# Patient Record
Sex: Female | Born: 1960 | Race: White | Hispanic: No | Smoking: Current every day smoker
Health system: Southern US, Community
[De-identification: ages and names within clinical notes are randomized; demographics above are authoritative.]

## PROBLEM LIST (undated history)

## (undated) DIAGNOSIS — E669 Obesity, unspecified: Secondary | ICD-10-CM

## (undated) DIAGNOSIS — E119 Type 2 diabetes mellitus without complications: Secondary | ICD-10-CM

## (undated) DIAGNOSIS — F32A Depression, unspecified: Secondary | ICD-10-CM

## (undated) DIAGNOSIS — I10 Essential (primary) hypertension: Secondary | ICD-10-CM

## (undated) DIAGNOSIS — Z720 Tobacco use: Secondary | ICD-10-CM

## (undated) DIAGNOSIS — E785 Hyperlipidemia, unspecified: Secondary | ICD-10-CM

## (undated) DIAGNOSIS — I2510 Atherosclerotic heart disease of native coronary artery without angina pectoris: Secondary | ICD-10-CM

## (undated) DIAGNOSIS — F329 Major depressive disorder, single episode, unspecified: Secondary | ICD-10-CM

## (undated) DIAGNOSIS — K219 Gastro-esophageal reflux disease without esophagitis: Secondary | ICD-10-CM

## (undated) DIAGNOSIS — G4730 Sleep apnea, unspecified: Secondary | ICD-10-CM

## (undated) DIAGNOSIS — M1990 Unspecified osteoarthritis, unspecified site: Secondary | ICD-10-CM

## (undated) HISTORY — PX: TUBAL LIGATION: SHX77

## (undated) HISTORY — DX: Depression, unspecified: F32.A

## (undated) HISTORY — DX: Major depressive disorder, single episode, unspecified: F32.9

## (undated) HISTORY — PX: CHOLECYSTECTOMY: SHX55

## (undated) HISTORY — PX: TONSILLECTOMY: SUR1361

---

## 1998-08-28 ENCOUNTER — Other Ambulatory Visit: Admission: RE | Admit: 1998-08-28 | Discharge: 1998-08-28 | Payer: Self-pay | Admitting: Internal Medicine

## 1998-09-22 ENCOUNTER — Ambulatory Visit (HOSPITAL_COMMUNITY): Admission: RE | Admit: 1998-09-22 | Discharge: 1998-09-22 | Payer: Self-pay | Admitting: Internal Medicine

## 1998-09-29 ENCOUNTER — Ambulatory Visit (HOSPITAL_COMMUNITY): Admission: RE | Admit: 1998-09-29 | Discharge: 1998-09-29 | Payer: Self-pay | Admitting: Family Medicine

## 1998-10-06 ENCOUNTER — Ambulatory Visit (HOSPITAL_COMMUNITY): Admission: RE | Admit: 1998-10-06 | Discharge: 1998-10-06 | Payer: Self-pay

## 1999-01-26 ENCOUNTER — Encounter: Payer: Self-pay | Admitting: Emergency Medicine

## 1999-01-26 ENCOUNTER — Emergency Department (HOSPITAL_COMMUNITY): Admission: EM | Admit: 1999-01-26 | Discharge: 1999-01-26 | Payer: Self-pay | Admitting: Emergency Medicine

## 1999-04-14 ENCOUNTER — Ambulatory Visit (HOSPITAL_COMMUNITY): Admission: RE | Admit: 1999-04-14 | Discharge: 1999-04-14 | Payer: Self-pay | Admitting: Family Medicine

## 2000-01-29 ENCOUNTER — Other Ambulatory Visit: Admission: RE | Admit: 2000-01-29 | Discharge: 2000-01-29 | Payer: Self-pay | Admitting: Internal Medicine

## 2000-06-30 ENCOUNTER — Encounter: Admission: RE | Admit: 2000-06-30 | Discharge: 2000-06-30 | Payer: Self-pay | Admitting: Obstetrics

## 2001-02-21 ENCOUNTER — Encounter: Admission: RE | Admit: 2001-02-21 | Discharge: 2001-02-21 | Payer: Self-pay | Admitting: Sports Medicine

## 2001-02-21 ENCOUNTER — Other Ambulatory Visit: Admission: RE | Admit: 2001-02-21 | Discharge: 2001-02-21 | Payer: Self-pay | Admitting: Obstetrics & Gynecology

## 2001-02-21 ENCOUNTER — Encounter (INDEPENDENT_AMBULATORY_CARE_PROVIDER_SITE_OTHER): Payer: Self-pay | Admitting: *Deleted

## 2001-02-22 ENCOUNTER — Encounter: Payer: Self-pay | Admitting: Sports Medicine

## 2001-02-22 ENCOUNTER — Encounter: Admission: RE | Admit: 2001-02-22 | Discharge: 2001-02-22 | Payer: Self-pay | Admitting: Sports Medicine

## 2001-02-24 ENCOUNTER — Encounter: Admission: RE | Admit: 2001-02-24 | Discharge: 2001-02-24 | Payer: Self-pay | Admitting: Sports Medicine

## 2001-02-24 ENCOUNTER — Encounter: Payer: Self-pay | Admitting: Sports Medicine

## 2001-03-07 ENCOUNTER — Encounter: Admission: RE | Admit: 2001-03-07 | Discharge: 2001-03-07 | Payer: Self-pay | Admitting: Sports Medicine

## 2001-04-06 ENCOUNTER — Encounter: Admission: RE | Admit: 2001-04-06 | Discharge: 2001-04-06 | Payer: Self-pay | Admitting: Family Medicine

## 2001-04-19 ENCOUNTER — Encounter: Admission: RE | Admit: 2001-04-19 | Discharge: 2001-04-19 | Payer: Self-pay | Admitting: Family Medicine

## 2001-05-10 ENCOUNTER — Encounter: Admission: RE | Admit: 2001-05-10 | Discharge: 2001-05-10 | Payer: Self-pay | Admitting: Family Medicine

## 2001-06-05 ENCOUNTER — Encounter: Admission: RE | Admit: 2001-06-05 | Discharge: 2001-06-05 | Payer: Self-pay | Admitting: Family Medicine

## 2001-06-29 ENCOUNTER — Ambulatory Visit (HOSPITAL_COMMUNITY): Admission: RE | Admit: 2001-06-29 | Discharge: 2001-06-29 | Payer: Self-pay | Admitting: Family Medicine

## 2001-06-29 ENCOUNTER — Encounter: Payer: Self-pay | Admitting: Gastroenterology

## 2001-10-25 ENCOUNTER — Encounter: Admission: RE | Admit: 2001-10-25 | Discharge: 2001-10-25 | Payer: Self-pay | Admitting: Family Medicine

## 2001-11-06 ENCOUNTER — Encounter: Admission: RE | Admit: 2001-11-06 | Discharge: 2001-11-06 | Payer: Self-pay | Admitting: Family Medicine

## 2001-11-29 ENCOUNTER — Encounter: Admission: RE | Admit: 2001-11-29 | Discharge: 2001-11-29 | Payer: Self-pay | Admitting: Family Medicine

## 2002-01-11 ENCOUNTER — Encounter: Admission: RE | Admit: 2002-01-11 | Discharge: 2002-01-11 | Payer: Self-pay | Admitting: Family Medicine

## 2002-03-06 ENCOUNTER — Encounter: Admission: RE | Admit: 2002-03-06 | Discharge: 2002-03-06 | Payer: Self-pay | Admitting: Internal Medicine

## 2002-03-06 ENCOUNTER — Encounter: Payer: Self-pay | Admitting: Internal Medicine

## 2002-03-22 ENCOUNTER — Encounter: Admission: RE | Admit: 2002-03-22 | Discharge: 2002-03-22 | Payer: Self-pay | Admitting: Family Medicine

## 2002-03-30 ENCOUNTER — Encounter: Admission: RE | Admit: 2002-03-30 | Discharge: 2002-03-30 | Payer: Self-pay | Admitting: Family Medicine

## 2002-06-25 ENCOUNTER — Encounter: Admission: RE | Admit: 2002-06-25 | Discharge: 2002-06-25 | Payer: Self-pay | Admitting: Family Medicine

## 2002-09-13 ENCOUNTER — Encounter: Payer: Self-pay | Admitting: Emergency Medicine

## 2002-09-13 ENCOUNTER — Emergency Department (HOSPITAL_COMMUNITY): Admission: EM | Admit: 2002-09-13 | Discharge: 2002-09-13 | Payer: Self-pay | Admitting: Emergency Medicine

## 2002-10-04 ENCOUNTER — Encounter: Admission: RE | Admit: 2002-10-04 | Discharge: 2002-10-04 | Payer: Self-pay | Admitting: Family Medicine

## 2002-12-18 ENCOUNTER — Encounter: Admission: RE | Admit: 2002-12-18 | Discharge: 2002-12-18 | Payer: Self-pay | Admitting: Sports Medicine

## 2002-12-18 ENCOUNTER — Other Ambulatory Visit: Admission: RE | Admit: 2002-12-18 | Discharge: 2002-12-18 | Payer: Self-pay | Admitting: Family Medicine

## 2003-03-08 ENCOUNTER — Encounter: Admission: RE | Admit: 2003-03-08 | Discharge: 2003-03-08 | Payer: Self-pay | Admitting: Family Medicine

## 2003-03-12 ENCOUNTER — Ambulatory Visit (HOSPITAL_COMMUNITY): Admission: RE | Admit: 2003-03-12 | Discharge: 2003-03-12 | Payer: Self-pay | Admitting: Family Medicine

## 2003-03-12 ENCOUNTER — Encounter: Payer: Self-pay | Admitting: Family Medicine

## 2003-07-09 ENCOUNTER — Encounter: Admission: RE | Admit: 2003-07-09 | Discharge: 2003-07-09 | Payer: Self-pay | Admitting: Family Medicine

## 2003-07-25 ENCOUNTER — Encounter: Admission: RE | Admit: 2003-07-25 | Discharge: 2003-07-25 | Payer: Self-pay | Admitting: Family Medicine

## 2003-08-28 ENCOUNTER — Encounter: Admission: RE | Admit: 2003-08-28 | Discharge: 2003-08-28 | Payer: Self-pay | Admitting: Family Medicine

## 2003-10-17 ENCOUNTER — Encounter: Admission: RE | Admit: 2003-10-17 | Discharge: 2003-10-17 | Payer: Self-pay | Admitting: Family Medicine

## 2003-11-08 ENCOUNTER — Encounter: Admission: RE | Admit: 2003-11-08 | Discharge: 2003-11-08 | Payer: Self-pay | Admitting: Family Medicine

## 2003-11-14 ENCOUNTER — Encounter: Admission: RE | Admit: 2003-11-14 | Discharge: 2003-11-14 | Payer: Self-pay | Admitting: Sports Medicine

## 2004-03-13 ENCOUNTER — Ambulatory Visit (HOSPITAL_COMMUNITY): Admission: RE | Admit: 2004-03-13 | Discharge: 2004-03-13 | Payer: Self-pay | Admitting: Sports Medicine

## 2004-03-24 ENCOUNTER — Ambulatory Visit: Payer: Self-pay | Admitting: Sports Medicine

## 2004-03-24 ENCOUNTER — Other Ambulatory Visit: Admission: RE | Admit: 2004-03-24 | Discharge: 2004-03-24 | Payer: Self-pay | Admitting: Family Medicine

## 2004-03-27 ENCOUNTER — Encounter (INDEPENDENT_AMBULATORY_CARE_PROVIDER_SITE_OTHER): Payer: Self-pay | Admitting: Family Medicine

## 2004-03-27 LAB — CONVERTED CEMR LAB: Pap Smear: NORMAL

## 2004-03-28 ENCOUNTER — Encounter (INDEPENDENT_AMBULATORY_CARE_PROVIDER_SITE_OTHER): Payer: Self-pay | Admitting: *Deleted

## 2004-03-28 LAB — CONVERTED CEMR LAB

## 2004-07-01 ENCOUNTER — Ambulatory Visit: Payer: Self-pay | Admitting: Family Medicine

## 2004-10-17 ENCOUNTER — Emergency Department (HOSPITAL_COMMUNITY): Admission: EM | Admit: 2004-10-17 | Discharge: 2004-10-17 | Payer: Self-pay | Admitting: Emergency Medicine

## 2004-11-12 ENCOUNTER — Ambulatory Visit: Payer: Self-pay | Admitting: Family Medicine

## 2004-12-04 ENCOUNTER — Emergency Department (HOSPITAL_COMMUNITY): Admission: EM | Admit: 2004-12-04 | Discharge: 2004-12-05 | Payer: Self-pay | Admitting: Emergency Medicine

## 2004-12-07 ENCOUNTER — Ambulatory Visit: Payer: Self-pay | Admitting: Family Medicine

## 2005-03-15 ENCOUNTER — Ambulatory Visit: Payer: Self-pay | Admitting: Sports Medicine

## 2005-03-15 ENCOUNTER — Encounter: Admission: RE | Admit: 2005-03-15 | Discharge: 2005-03-15 | Payer: Self-pay | Admitting: Sports Medicine

## 2005-03-15 ENCOUNTER — Ambulatory Visit (HOSPITAL_COMMUNITY): Admission: RE | Admit: 2005-03-15 | Discharge: 2005-03-15 | Payer: Self-pay | Admitting: Family Medicine

## 2005-03-25 ENCOUNTER — Emergency Department (HOSPITAL_COMMUNITY): Admission: EM | Admit: 2005-03-25 | Discharge: 2005-03-25 | Payer: Self-pay | Admitting: Emergency Medicine

## 2005-04-26 ENCOUNTER — Ambulatory Visit: Payer: Self-pay | Admitting: Family Medicine

## 2005-05-07 ENCOUNTER — Ambulatory Visit: Payer: Self-pay | Admitting: Family Medicine

## 2005-05-12 ENCOUNTER — Emergency Department (HOSPITAL_COMMUNITY): Admission: EM | Admit: 2005-05-12 | Discharge: 2005-05-12 | Payer: Self-pay | Admitting: Emergency Medicine

## 2005-06-09 ENCOUNTER — Emergency Department (HOSPITAL_COMMUNITY): Admission: EM | Admit: 2005-06-09 | Discharge: 2005-06-09 | Payer: Self-pay | Admitting: Family Medicine

## 2005-06-27 ENCOUNTER — Emergency Department (HOSPITAL_COMMUNITY): Admission: EM | Admit: 2005-06-27 | Discharge: 2005-06-27 | Payer: Self-pay | Admitting: Emergency Medicine

## 2005-06-30 ENCOUNTER — Encounter (INDEPENDENT_AMBULATORY_CARE_PROVIDER_SITE_OTHER): Payer: Self-pay | Admitting: *Deleted

## 2005-06-30 ENCOUNTER — Observation Stay (HOSPITAL_COMMUNITY): Admission: EM | Admit: 2005-06-30 | Discharge: 2005-07-01 | Payer: Self-pay | Admitting: Emergency Medicine

## 2005-07-11 ENCOUNTER — Emergency Department (HOSPITAL_COMMUNITY): Admission: EM | Admit: 2005-07-11 | Discharge: 2005-07-11 | Payer: Self-pay | Admitting: Family Medicine

## 2005-07-14 ENCOUNTER — Ambulatory Visit: Payer: Self-pay | Admitting: Sports Medicine

## 2005-09-09 ENCOUNTER — Emergency Department (HOSPITAL_COMMUNITY): Admission: EM | Admit: 2005-09-09 | Discharge: 2005-09-09 | Payer: Self-pay | Admitting: Family Medicine

## 2005-09-22 ENCOUNTER — Encounter: Payer: Self-pay | Admitting: Family Medicine

## 2006-02-23 ENCOUNTER — Ambulatory Visit: Payer: Self-pay | Admitting: Family Medicine

## 2006-03-16 ENCOUNTER — Ambulatory Visit (HOSPITAL_COMMUNITY): Admission: RE | Admit: 2006-03-16 | Discharge: 2006-03-16 | Payer: Self-pay | Admitting: Internal Medicine

## 2006-05-12 ENCOUNTER — Ambulatory Visit: Payer: Self-pay | Admitting: Family Medicine

## 2006-07-21 DIAGNOSIS — F339 Major depressive disorder, recurrent, unspecified: Secondary | ICD-10-CM | POA: Insufficient documentation

## 2006-07-21 DIAGNOSIS — E669 Obesity, unspecified: Secondary | ICD-10-CM | POA: Insufficient documentation

## 2006-07-21 DIAGNOSIS — F17200 Nicotine dependence, unspecified, uncomplicated: Secondary | ICD-10-CM | POA: Insufficient documentation

## 2006-07-21 DIAGNOSIS — K219 Gastro-esophageal reflux disease without esophagitis: Secondary | ICD-10-CM | POA: Insufficient documentation

## 2006-07-21 DIAGNOSIS — N393 Stress incontinence (female) (male): Secondary | ICD-10-CM | POA: Insufficient documentation

## 2006-07-22 ENCOUNTER — Encounter (INDEPENDENT_AMBULATORY_CARE_PROVIDER_SITE_OTHER): Payer: Self-pay | Admitting: *Deleted

## 2006-10-24 ENCOUNTER — Ambulatory Visit: Payer: Self-pay | Admitting: Family Medicine

## 2006-10-24 ENCOUNTER — Telehealth: Payer: Self-pay | Admitting: *Deleted

## 2006-10-29 ENCOUNTER — Emergency Department (HOSPITAL_COMMUNITY): Admission: EM | Admit: 2006-10-29 | Discharge: 2006-10-29 | Payer: Self-pay | Admitting: Family Medicine

## 2007-01-29 ENCOUNTER — Emergency Department (HOSPITAL_COMMUNITY): Admission: EM | Admit: 2007-01-29 | Discharge: 2007-01-29 | Payer: Self-pay | Admitting: Family Medicine

## 2007-02-06 ENCOUNTER — Ambulatory Visit: Payer: Self-pay | Admitting: Internal Medicine

## 2007-02-10 ENCOUNTER — Encounter (INDEPENDENT_AMBULATORY_CARE_PROVIDER_SITE_OTHER): Payer: Self-pay | Admitting: *Deleted

## 2007-02-15 ENCOUNTER — Emergency Department (HOSPITAL_COMMUNITY): Admission: EM | Admit: 2007-02-15 | Discharge: 2007-02-16 | Payer: Self-pay | Admitting: Emergency Medicine

## 2007-03-22 ENCOUNTER — Ambulatory Visit (HOSPITAL_COMMUNITY): Admission: RE | Admit: 2007-03-22 | Discharge: 2007-03-22 | Payer: Self-pay | Admitting: Family Medicine

## 2007-04-06 ENCOUNTER — Encounter (INDEPENDENT_AMBULATORY_CARE_PROVIDER_SITE_OTHER): Payer: Self-pay | Admitting: Family Medicine

## 2007-04-12 ENCOUNTER — Ambulatory Visit: Payer: Self-pay | Admitting: Family Medicine

## 2007-04-12 ENCOUNTER — Encounter (INDEPENDENT_AMBULATORY_CARE_PROVIDER_SITE_OTHER): Payer: Self-pay | Admitting: Family Medicine

## 2007-04-12 DIAGNOSIS — R5383 Other fatigue: Secondary | ICD-10-CM

## 2007-04-12 DIAGNOSIS — R5381 Other malaise: Secondary | ICD-10-CM | POA: Insufficient documentation

## 2007-04-12 DIAGNOSIS — I10 Essential (primary) hypertension: Secondary | ICD-10-CM | POA: Insufficient documentation

## 2007-04-12 LAB — CONVERTED CEMR LAB
Free T4: 0.99 ng/dL (ref 0.89–1.80)
HCT: 44.7 % (ref 36.0–46.0)
Hemoglobin: 14.7 g/dL (ref 12.0–15.0)
MCHC: 32.9 g/dL (ref 30.0–36.0)
MCV: 95.9 fL (ref 78.0–100.0)
Platelets: 262 10*3/uL (ref 150–400)
RBC: 4.66 M/uL (ref 3.87–5.11)
RDW: 14.2 % (ref 11.5–15.5)
T3, Free: 3 pg/mL (ref 2.3–4.2)
TSH: 2.226 microintl units/mL (ref 0.350–5.50)
WBC: 8.6 10*3/uL (ref 4.0–10.5)

## 2007-04-13 ENCOUNTER — Encounter (INDEPENDENT_AMBULATORY_CARE_PROVIDER_SITE_OTHER): Payer: Self-pay | Admitting: Family Medicine

## 2007-04-27 ENCOUNTER — Encounter (INDEPENDENT_AMBULATORY_CARE_PROVIDER_SITE_OTHER): Payer: Self-pay | Admitting: Family Medicine

## 2007-04-27 ENCOUNTER — Other Ambulatory Visit: Admission: RE | Admit: 2007-04-27 | Discharge: 2007-04-27 | Payer: Self-pay | Admitting: Family Medicine

## 2007-04-27 ENCOUNTER — Ambulatory Visit: Payer: Self-pay | Admitting: Family Medicine

## 2007-04-27 LAB — CONVERTED CEMR LAB
Chlamydia, DNA Probe: NEGATIVE
GC Probe Amp, Genital: NEGATIVE
Whiff Test: POSITIVE

## 2007-05-01 ENCOUNTER — Encounter (INDEPENDENT_AMBULATORY_CARE_PROVIDER_SITE_OTHER): Payer: Self-pay | Admitting: Family Medicine

## 2007-05-01 ENCOUNTER — Emergency Department (HOSPITAL_COMMUNITY): Admission: EM | Admit: 2007-05-01 | Discharge: 2007-05-01 | Payer: Self-pay | Admitting: Emergency Medicine

## 2007-05-01 ENCOUNTER — Ambulatory Visit: Payer: Self-pay | Admitting: Family Medicine

## 2007-05-01 LAB — CONVERTED CEMR LAB
ALT: 11 units/L (ref 0–35)
AST: 13 units/L (ref 0–37)
Albumin: 3.8 g/dL (ref 3.5–5.2)
Alkaline Phosphatase: 79 units/L (ref 39–117)
BUN: 18 mg/dL (ref 6–23)
CO2: 28 meq/L (ref 19–32)
Calcium: 9 mg/dL (ref 8.4–10.5)
Chloride: 104 meq/L (ref 96–112)
Cholesterol: 195 mg/dL (ref 0–200)
Creatinine, Ser: 0.63 mg/dL (ref 0.40–1.20)
Glucose, Bld: 120 mg/dL — ABNORMAL HIGH (ref 70–99)
HDL: 42 mg/dL (ref >39)
LDL Cholesterol: 107 mg/dL — ABNORMAL HIGH (ref 0–99)
Potassium: 3.9 meq/L (ref 3.5–5.3)
Sodium: 143 meq/L (ref 135–145)
Total Bilirubin: 0.3 mg/dL (ref 0.3–1.2)
Total CHOL/HDL Ratio: 4.6
Total Protein: 6.4 g/dL (ref 6.0–8.3)
Triglycerides: 229 mg/dL — ABNORMAL HIGH (ref <150)
VLDL: 46 mg/dL — ABNORMAL HIGH (ref 0–40)

## 2007-05-03 ENCOUNTER — Encounter (INDEPENDENT_AMBULATORY_CARE_PROVIDER_SITE_OTHER): Payer: Self-pay | Admitting: Family Medicine

## 2007-05-03 LAB — CONVERTED CEMR LAB
Cholesterol, target level: 200 mg/dL
HDL goal, serum: 40 mg/dL
LDL Goal: 130 mg/dL
Pap Smear: NORMAL

## 2007-06-26 ENCOUNTER — Ambulatory Visit: Payer: Self-pay | Admitting: Family Medicine

## 2007-06-26 LAB — CONVERTED CEMR LAB
Bilirubin Urine: NEGATIVE
Glucose, Urine, Semiquant: NEGATIVE
Ketones, urine, test strip: NEGATIVE
Nitrite: NEGATIVE
Specific Gravity, Urine: 1.025
Urobilinogen, UA: 0.2
WBC, UA: >20 cells/hpf
Whiff Test: NEGATIVE
pH: 6.5

## 2007-06-30 ENCOUNTER — Telehealth: Payer: Self-pay | Admitting: *Deleted

## 2007-07-04 ENCOUNTER — Telehealth (INDEPENDENT_AMBULATORY_CARE_PROVIDER_SITE_OTHER): Payer: Self-pay | Admitting: Family Medicine

## 2007-07-22 ENCOUNTER — Emergency Department (HOSPITAL_COMMUNITY): Admission: EM | Admit: 2007-07-22 | Discharge: 2007-07-23 | Payer: Self-pay | Admitting: Emergency Medicine

## 2007-08-24 ENCOUNTER — Emergency Department (HOSPITAL_COMMUNITY): Admission: EM | Admit: 2007-08-24 | Discharge: 2007-08-25 | Payer: Self-pay | Admitting: Emergency Medicine

## 2007-08-29 ENCOUNTER — Ambulatory Visit: Payer: Self-pay | Admitting: Family Medicine

## 2007-08-30 ENCOUNTER — Encounter (INDEPENDENT_AMBULATORY_CARE_PROVIDER_SITE_OTHER): Payer: Self-pay | Admitting: *Deleted

## 2007-11-12 ENCOUNTER — Emergency Department (HOSPITAL_COMMUNITY): Admission: EM | Admit: 2007-11-12 | Discharge: 2007-11-12 | Payer: Self-pay | Admitting: Emergency Medicine

## 2008-01-29 ENCOUNTER — Emergency Department (HOSPITAL_COMMUNITY): Admission: EM | Admit: 2008-01-29 | Discharge: 2008-01-29 | Payer: Self-pay | Admitting: Emergency Medicine

## 2008-03-22 ENCOUNTER — Emergency Department (HOSPITAL_COMMUNITY): Admission: EM | Admit: 2008-03-22 | Discharge: 2008-03-22 | Payer: Self-pay | Admitting: Emergency Medicine

## 2008-03-25 ENCOUNTER — Ambulatory Visit (HOSPITAL_COMMUNITY): Admission: RE | Admit: 2008-03-25 | Discharge: 2008-03-25 | Payer: Self-pay | Admitting: Family Medicine

## 2008-04-02 ENCOUNTER — Ambulatory Visit: Payer: Self-pay | Admitting: Family Medicine

## 2008-04-02 ENCOUNTER — Encounter: Admission: RE | Admit: 2008-04-02 | Discharge: 2008-04-02 | Payer: Self-pay | Admitting: Family Medicine

## 2008-04-02 ENCOUNTER — Telehealth (INDEPENDENT_AMBULATORY_CARE_PROVIDER_SITE_OTHER): Payer: Self-pay | Admitting: *Deleted

## 2008-04-02 DIAGNOSIS — R05 Cough: Secondary | ICD-10-CM

## 2008-04-02 DIAGNOSIS — R059 Cough, unspecified: Secondary | ICD-10-CM | POA: Insufficient documentation

## 2008-04-05 ENCOUNTER — Telehealth: Payer: Self-pay | Admitting: *Deleted

## 2008-04-08 ENCOUNTER — Encounter: Payer: Self-pay | Admitting: Family Medicine

## 2008-04-08 ENCOUNTER — Other Ambulatory Visit: Admission: RE | Admit: 2008-04-08 | Discharge: 2008-04-08 | Payer: Self-pay | Admitting: Family Medicine

## 2008-04-08 ENCOUNTER — Ambulatory Visit: Payer: Self-pay | Admitting: Family Medicine

## 2008-04-08 DIAGNOSIS — R3915 Urgency of urination: Secondary | ICD-10-CM | POA: Insufficient documentation

## 2008-04-08 DIAGNOSIS — L608 Other nail disorders: Secondary | ICD-10-CM | POA: Insufficient documentation

## 2008-04-08 DIAGNOSIS — N951 Menopausal and female climacteric states: Secondary | ICD-10-CM | POA: Insufficient documentation

## 2008-04-08 LAB — CONVERTED CEMR LAB
ALT: 12 units/L (ref 0–35)
AST: 12 units/L (ref 0–37)
Albumin: 3.7 g/dL (ref 3.5–5.2)
Alkaline Phosphatase: 90 units/L (ref 39–117)
BUN: 21 mg/dL (ref 6–23)
Bilirubin Urine: NEGATIVE
CO2: 24 meq/L (ref 19–32)
Calcium: 8.8 mg/dL (ref 8.4–10.5)
Chloride: 105 meq/L (ref 96–112)
Cholesterol: 222 mg/dL — ABNORMAL HIGH (ref 0–200)
Creatinine, Ser: 0.61 mg/dL (ref 0.40–1.20)
FSH: 7.3 milliintl units/mL
Glucose, Bld: 130 mg/dL — ABNORMAL HIGH (ref 70–99)
Glucose, Urine, Semiquant: NEGATIVE
HDL: 41 mg/dL (ref >39)
Ketones, urine, test strip: NEGATIVE
LDL Cholesterol: 144 mg/dL — ABNORMAL HIGH (ref 0–99)
Nitrite: NEGATIVE
Potassium: 4.3 meq/L (ref 3.5–5.3)
Protein, U semiquant: NEGATIVE
Sodium: 139 meq/L (ref 135–145)
Specific Gravity, Urine: 1.02
TSH: 1.658 microintl units/mL (ref 0.350–4.50)
Total Bilirubin: 0.4 mg/dL (ref 0.3–1.2)
Total CHOL/HDL Ratio: 5.4
Total Protein: 6.5 g/dL (ref 6.0–8.3)
Triglycerides: 187 mg/dL — ABNORMAL HIGH (ref <150)
Urobilinogen, UA: 0.2
VLDL: 37 mg/dL (ref 0–40)
WBC Urine, dipstick: NEGATIVE
pH: 6.5

## 2008-04-10 ENCOUNTER — Encounter: Payer: Self-pay | Admitting: Family Medicine

## 2008-04-11 ENCOUNTER — Telehealth: Payer: Self-pay | Admitting: Family Medicine

## 2008-04-12 ENCOUNTER — Encounter: Payer: Self-pay | Admitting: Family Medicine

## 2008-07-22 ENCOUNTER — Emergency Department (HOSPITAL_COMMUNITY): Admission: EM | Admit: 2008-07-22 | Discharge: 2008-07-22 | Payer: Self-pay | Admitting: Emergency Medicine

## 2008-10-29 ENCOUNTER — Emergency Department (HOSPITAL_COMMUNITY): Admission: EM | Admit: 2008-10-29 | Discharge: 2008-10-29 | Payer: Self-pay | Admitting: Emergency Medicine

## 2008-11-02 ENCOUNTER — Emergency Department (HOSPITAL_COMMUNITY): Admission: EM | Admit: 2008-11-02 | Discharge: 2008-11-02 | Payer: Self-pay | Admitting: Emergency Medicine

## 2009-03-26 ENCOUNTER — Ambulatory Visit (HOSPITAL_COMMUNITY): Admission: RE | Admit: 2009-03-26 | Discharge: 2009-03-26 | Payer: Self-pay | Admitting: Family Medicine

## 2009-04-03 ENCOUNTER — Emergency Department (HOSPITAL_COMMUNITY): Admission: EM | Admit: 2009-04-03 | Discharge: 2009-04-03 | Payer: Self-pay | Admitting: Emergency Medicine

## 2009-09-30 ENCOUNTER — Emergency Department (HOSPITAL_COMMUNITY): Admission: EM | Admit: 2009-09-30 | Discharge: 2009-09-30 | Payer: Self-pay | Admitting: Emergency Medicine

## 2009-10-23 ENCOUNTER — Emergency Department (HOSPITAL_COMMUNITY): Admission: EM | Admit: 2009-10-23 | Discharge: 2009-10-23 | Payer: Self-pay | Admitting: Emergency Medicine

## 2010-03-30 ENCOUNTER — Ambulatory Visit (HOSPITAL_COMMUNITY): Admission: RE | Admit: 2010-03-30 | Discharge: 2010-03-30 | Payer: Self-pay | Admitting: Family Medicine

## 2010-06-14 ENCOUNTER — Encounter: Payer: Self-pay | Admitting: Family Medicine

## 2010-06-25 NOTE — Letter (Signed)
Summary: Fasting Blood Glucose 130  [facility]  [address]   [place], [place] [address]   Phone: [phone]  Fax: [phone]    04/12/2008  Sarah Charles [address] [place], [place]  [address]  [name],  Your blood sugar level was 130.  A normal fasting level is 70-100.  This does not indicate that you have diabetes.  To formally diagnose diabetes, we need 2 values of blood sugar >126 measured on separate occasions.  While I do not think it is time to start any medicines, I do think it would be appropriate for you to see our nutritionist, to discuss diet modifications to keep your blood sugar under control and prevent you from developing diabetes.  I will forward your information to our nutritionist and ask her to call you to schedule an appointment.  Sincerely,   Romero Belling MD [facility]  Appended Document: Fasting Blood Glucose 130 letter sent

## 2010-06-25 NOTE — Letter (Signed)
Summary: Pap -- wnl  [facility]  [address]   [place], [place] [address]   Phone: [phone]  Fax: [phone]    04/10/2008  [address] [place], [place]  [address]  [name],   The following are the results of your recent test(s):  Test     Result     Pap Smear    Normal____X___  Not Normal_____       Comments:  Repeat in 1 year.  Cholesterol LDL(Bad cholesterol):   144       Your goal is less than:    100     HDL (Good cholesterol):    41    Your goal is more than: 40   Sincerely,  Romero Belling MD [facility]           Appended Document: Pap -- wnl mailed

## 2010-06-25 NOTE — Progress Notes (Signed)
Summary: Rx  Phone Note Call from Patient Call back at Home Phone [phone]   Summary of Call: Pt states she is unable to afford OTC prilosec and wants to speak with someone about getting something called in that is cheaper. Initial call taken by: Haydee Salter,  July 04, 2007 8:59 AM  Follow-up for Phone Call        pt line busy. sent request to md Follow-up by: Golden Circle RN,  July 04, 2007 9:17 AM  Additional Follow-up for Phone Call Additional follow up Details #1::        She can use Zantac OTC. Additional Follow-up by: Towana Badger MD,  July 04, 2007 1:41 PM    Additional Follow-up for Phone Call Additional follow up Details #2::    left message Follow-up by: Golden Circle RN,  July 04, 2007 1:52 PM  Additional Follow-up for Phone Call Additional follow up Details #3:: Details for Additional Follow-up Action Taken: Spoke with pt- advised that she can try Zantac OTC per Dr. Mannie Stabile.  Pt agreeable. Additional Follow-up by: AMY MARTIN RN,  July 05, 2007 9:54 AM

## 2010-08-10 LAB — COMPREHENSIVE METABOLIC PANEL
ALT: 18 U/L (ref 0–35)
AST: 19 U/L (ref 0–37)
Albumin: 3.8 g/dL (ref 3.5–5.2)
Alkaline Phosphatase: 107 U/L (ref 39–117)
BUN: 19 mg/dL (ref 6–23)
CO2: 30 mEq/L (ref 19–32)
Calcium: 9.5 mg/dL (ref 8.4–10.5)
Chloride: 104 mEq/L (ref 96–112)
Creatinine, Ser: 0.66 mg/dL (ref 0.4–1.2)
GFR calc Af Amer: >60 mL/min (ref >60)
GFR calc non Af Amer: >60 mL/min (ref >60)
Glucose, Bld: 143 mg/dL — ABNORMAL HIGH (ref 70–99)
Potassium: 4.1 mEq/L (ref 3.5–5.1)
Sodium: 140 mEq/L (ref 135–145)
Total Bilirubin: 0.6 mg/dL (ref 0.3–1.2)
Total Protein: 7.5 g/dL (ref 6.0–8.3)

## 2010-08-10 LAB — LIPASE, BLOOD: Lipase: 23 U/L (ref 11–59)

## 2010-08-10 LAB — DIFFERENTIAL
Basophils Absolute: 0.1 10*3/uL (ref 0.0–0.1)
Basophils Relative: 1 % (ref 0–1)
Eosinophils Absolute: 0.2 10*3/uL (ref 0.0–0.7)
Eosinophils Relative: 2 % (ref 0–5)
Lymphocytes Relative: 18 % (ref 12–46)
Lymphs Abs: 1.8 10*3/uL (ref 0.7–4.0)
Monocytes Absolute: 0.6 10*3/uL (ref 0.1–1.0)
Monocytes Relative: 6 % (ref 3–12)
Neutro Abs: 7.2 10*3/uL (ref 1.7–7.7)
Neutrophils Relative %: 73 % (ref 43–77)

## 2010-08-10 LAB — CBC
HCT: 41.3 % (ref 36.0–46.0)
Hemoglobin: 14.4 g/dL (ref 12.0–15.0)
MCHC: 34.8 g/dL (ref 30.0–36.0)
MCV: 91.5 fL (ref 78.0–100.0)
Platelets: 230 10*3/uL (ref 150–400)
RBC: 4.52 MIL/uL (ref 3.87–5.11)
RDW: 13.4 % (ref 11.5–15.5)
WBC: 9.9 10*3/uL (ref 4.0–10.5)

## 2010-08-10 LAB — URINALYSIS, ROUTINE W REFLEX MICROSCOPIC
Bilirubin Urine: NEGATIVE
Glucose, UA: NEGATIVE mg/dL
Hgb urine dipstick: NEGATIVE
Ketones, ur: NEGATIVE mg/dL
Nitrite: NEGATIVE
Protein, ur: NEGATIVE mg/dL
Specific Gravity, Urine: 1.032 — ABNORMAL HIGH (ref 1.005–1.030)
Urobilinogen, UA: 0.2 mg/dL (ref 0.0–1.0)
pH: 5 (ref 5.0–8.0)

## 2010-08-10 LAB — SEDIMENTATION RATE: Sed Rate: 18 mm/hr (ref 0–22)

## 2010-08-10 LAB — PREGNANCY, URINE: Preg Test, Ur: NEGATIVE

## 2010-08-19 ENCOUNTER — Other Ambulatory Visit: Payer: Self-pay | Admitting: Family Medicine

## 2010-08-19 ENCOUNTER — Other Ambulatory Visit (HOSPITAL_COMMUNITY)
Admission: RE | Admit: 2010-08-19 | Discharge: 2010-08-19 | Disposition: A | Discharge status: Home / Self Care | Payer: Medicaid Other | Source: Ambulatory Visit | Attending: Family Medicine | Admitting: Family Medicine

## 2010-08-19 DIAGNOSIS — Z01419 Encounter for gynecological examination (general) (routine) without abnormal findings: Secondary | ICD-10-CM | POA: Insufficient documentation

## 2010-10-08 ENCOUNTER — Emergency Department (HOSPITAL_COMMUNITY): Payer: No Typology Code available for payment source

## 2010-10-08 ENCOUNTER — Emergency Department (HOSPITAL_COMMUNITY)
Admission: EM | Admit: 2010-10-08 | Discharge: 2010-10-08 | Disposition: A | Discharge status: Home / Self Care | Payer: No Typology Code available for payment source | Attending: Emergency Medicine | Admitting: Emergency Medicine

## 2010-10-08 DIAGNOSIS — M545 Low back pain, unspecified: Secondary | ICD-10-CM | POA: Insufficient documentation

## 2010-10-08 DIAGNOSIS — T1490XA Injury, unspecified, initial encounter: Secondary | ICD-10-CM | POA: Insufficient documentation

## 2010-10-08 DIAGNOSIS — I10 Essential (primary) hypertension: Secondary | ICD-10-CM | POA: Insufficient documentation

## 2010-10-08 DIAGNOSIS — E119 Type 2 diabetes mellitus without complications: Secondary | ICD-10-CM | POA: Insufficient documentation

## 2010-10-08 DIAGNOSIS — M25519 Pain in unspecified shoulder: Secondary | ICD-10-CM | POA: Insufficient documentation

## 2010-10-08 DIAGNOSIS — W010XXA Fall on same level from slipping, tripping and stumbling without subsequent striking against object, initial encounter: Secondary | ICD-10-CM | POA: Insufficient documentation

## 2010-10-08 DIAGNOSIS — Y9229 Other specified public building as the place of occurrence of the external cause: Secondary | ICD-10-CM | POA: Insufficient documentation

## 2010-10-09 NOTE — Op Note (Signed)
NAME:  Sarah Charles, STREIGHT NO.:  [id number]   MEDICAL RECORD NO.:  [id number]          PATIENT TYPE:  INP   LOCATION:  5151                         FACILITY:  [facility]   PHYSICIAN:  Cherylynn Ridges, M.D.    DATE OF BIRTH:  1960-10-11   DATE OF PROCEDURE:  06/30/2005  DATE OF DISCHARGE:                                 OPERATIVE REPORT   PREOP DIAGNOSIS:  Cholelithiasis and acute cholecystitis.   POSTOP DIAGNOSIS:  Cholelithiasis and chronic cholecystitis   PROCEDURE:  Laparoscopic cholecystectomy.   SURGEON:  Cherylynn Ridges, M.D.   ASSISTANT:  Jerold Coombe, P.A.   ANESTHESIA:  General endotracheal.   ESTIMATED BLOOD LOSS:  Less than 20 mL.   COMPLICATIONS:  None.   CONDITION:  Stable.   INDICATIONS FOR OPERATION:  The patient is an otherwise healthy 50 year old  female with severe abdominal pain, sent over from general surgery office  with what was thought to be out to be acute cholecystitis.   FINDINGS:  The patient had a normal, mildly thickened gallbladder wall  without evidence of acute cholecystitis, and normal cholangiogram.   OPERATION:  The patient was taken to the operating room, placed on table in  the supine position. After an adequate endotracheal anesthetic was  administered, she was prepped and draped in usual sterile manner exposing  the midline and the right upper quadrant of the abdomen.   A supraumbilical curvilinear incision was made using an #11 blade; and was  taken down to the midline fascia. This fascia was grasped using Kocher  clamps and then subsequently an incision made in the fascia using a #15  blade. We were able to bluntly dissect into the peritoneal cavity using a  Kelly clamp; and then used a pursestring suture of #0 Vicryl to secure in a  Hassan cannula which was subsequently passed. Carbon dioxide gas up to a  maximal pressure of 15 mmHg was then insufflated through the [facility] cannula  into the peritoneal cavity. We  then passed via the right costal margin 5-mm  cannulas, and a subxiphoid 11-12-mm cannula under direct vision. Once this  was done, the patient was placed in reverse Trendelenburg and the left-side  was tilted down, and the operation begun.   The gallbladder was grasped using a ratcheted grasper through the lateral-  most 5-mm cannula and retracted towards the anterior abdominal wall and the  right upper quadrant. A second grasper was passed down into the infundibulum  of the gallbladder, tenting that out as we dissected out of the peritoneum  overlying the triangle of Fallot and hepatic duodenal triangle.  We  dissected out the cystic duct and the cystic artery. We doubly clipped the  cystic artery proximally and singly distally. We also placed a distal clip  along the gallbladder side of the cystic duct. We then made a  cholecystodochotomy using the laparoscopic scissors and performed a  cholangiogram using a Cook catheter which had been passed through the  anterior abdominal wall. This cholangiogram was secured in place with the  Endo clip; and cholangiogram was  performed which showed good flow into the  duodenum. No intraductal filling defects, good proximal filling; and no  evidence of obstruction.   Subsequent to doing the cholangiogram, we removed the clip, secured the  catheter in place, and removed the cholangiocatheter. We distally clipped  the cystic duct x2, and then transected it.  We then transected the artery,  and then dissected out the gallbladder, from its bed, with minimal  difficulty. An EndoCatch bag was used to retrieve the gallbladder from the  supraumbilical site.  Once the gallbladder was removed we irrigated with  about 1.2 liters of warm saline solution. Once there was found to be good  hemostasis in the bed, using electrocautery. We aspirated all gas and fluid  from above the liver; and removed all cannulas.   The supraumbilical site was closed using a  pursestring suture which was in  place to secure the Hasson cannula. Once all this was done, the skin at all  sites were closed using running subcuticular suture of 4-0 Vicryl. Then 1/4%  Marcaine with epinephrine was injected at all sites. All needle counts,  sponge counts, and instrument counts were correct at the conclusion of case.  Sterile dressings were applied.      Cherylynn Ridges, M.D.  Electronically Signed     JOW/MEDQ  D:  06/30/2005  T:  06/30/2005  Job:  [id number]

## 2010-10-09 NOTE — H&P (Signed)
NAME:  Sarah Charles, Sarah Charles NO.:  [id number]   MEDICAL RECORD NO.:  [id number]          PATIENT TYPE:  INP   LOCATION:  5151                         FACILITY:  [facility]   PHYSICIAN:  Cherylynn Ridges, M.D.    DATE OF BIRTH:  1961-02-11   DATE OF ADMISSION:  06/30/2005  DATE OF DISCHARGE:                                HISTORY & PHYSICAL   CHIEF COMPLAINT:  Abdominal pain in a patient with known cholelithiasis.   HISTORY OF PRESENT ILLNESS:  [name] is a 50 year old female patient,  history of chronic indigestion and intolerance to spicy foods for six years.  She has also noticed postprandial discomfort and nausea and indigestion  after greasy or fried foods.  Over the past one week she has noticed  increased abdominal pain not necessarily associated with eating.  She was  evaluated on February 4 at [facility] ER.  An ultrasound did demonstrate  positive cholelithiasis without ductal dilatation.  Her laboratory work was  within normal limits.  No elevation in LFTs or total bilirubin.  She was  sent home with oral pain medications and was set up to see Dr. Earlene Charles in the  office today for additional evaluation.  [name]   REVIEW OF SYSTEMS:  No fevers, chills, myalgias.  No cough.  No shortness of  breath.  No chest pain.  Abdominal pain as described.  No dark or bloody  stools.  No hematuria.  No dysuria.  No swelling in the legs.   FAMILY HISTORY:  Noncontributory to this admission.   PAST MEDICAL HISTORY:  None.   PAST SURGICAL HISTORY:  Prior EGD.   SOCIAL HISTORY:  She smokes one pack of cigarettes per day.  She does not  drink alcohol.  She recently split with a boyfriend of nine years.  She  works in Personnel officer.   ALLERGIES:  NKDA.   CURRENT MEDICATIONS:  Unknown pain medication.  Patient did not bring with  her and is not  listed in the documentation from [facility] ER.   PHYSICAL EXAMINATION:  GENERAL:  This is a pleasant female patient currently  complaining of significant epigastric and right upper quadrant abdominal  pain.  VITAL SIGNS:  Temperature 97, blood pressure 136/87, pulse 67 and regular,  respirations 20.  HEENT:  Head is normocephalic.  Sclerae not injected.  NECK:  Supple.  No adenopathy.  CHEST:  Bilateral lung sounds clear to auscultation.  RESPIRATORY:  Effort non-labored.  CARDIAC:  S1, S2.  No rubs, murmurs, thrills.  No gallops.  Pulses regular.  No JVD.  ABDOMEN:  Soft, nondistended.  No obvious hepatosplenomegaly.  Gallbladder  is not palpable.  There is a positive Murphy's sign with palpation over the  right upper quadrant.  Bowel sounds are present.  EXTREMITIES:  Symmetrical in appearance without edema, cyanosis, clubbing.   Diagnostics and ultrasound done on June 27, 2004 demonstrates multiple  gallstones without ductal dilatation.  Urine pregnancy was negative.  Alkaline phosphatase was 91, AST 17, ALT 15, total bilirubin 0.6.  PTT 30,  PT 13.1, INR 1.  WBC 7600, hemoglobin 14.7, platelets 277,000, neutrophils  55%, lipase 23.  Sodium 138, potassium 3.8. CO2 29, BUN 9, creatinine 0.6.   IMPRESSION:  Abdominal pain, continuous secondary to cholecystitis and known  cholelithiasis per ultrasound.   PLAN:  Patient will be admitted.  She is placed on n.p.o. status.  We will  take her to the OR today for removal of gallbladder.  Will start IV Unasyn,  IV fluids, Dilaudid for pain, Zofran for nausea, preoperative EKG and chest  x-ray due to patient's age over 44 and smoking history.      Allison L. Rennis Harding, N.P.      Cherylynn Ridges, M.D.  Electronically Signed    ALE/MEDQ  D:  06/30/2005  T:  06/30/2005  Job:  [id number]

## 2010-10-24 ENCOUNTER — Emergency Department (HOSPITAL_COMMUNITY)
Admission: EM | Admit: 2010-10-24 | Discharge: 2010-10-24 | Discharge status: Against Medical Advice | Payer: Medicaid Other | Attending: Emergency Medicine | Admitting: Emergency Medicine

## 2010-10-27 ENCOUNTER — Other Ambulatory Visit: Payer: Self-pay | Admitting: Gastroenterology

## 2011-01-28 ENCOUNTER — Emergency Department (HOSPITAL_COMMUNITY)
Admission: EM | Admit: 2011-01-28 | Discharge: 2011-01-29 | Disposition: A | Discharge status: Home / Self Care | Payer: Medicaid Other | Attending: Emergency Medicine | Admitting: Emergency Medicine

## 2011-01-28 DIAGNOSIS — E860 Dehydration: Secondary | ICD-10-CM | POA: Insufficient documentation

## 2011-01-28 DIAGNOSIS — R000 Tachycardia, unspecified: Secondary | ICD-10-CM | POA: Insufficient documentation

## 2011-01-28 DIAGNOSIS — E119 Type 2 diabetes mellitus without complications: Secondary | ICD-10-CM | POA: Insufficient documentation

## 2011-01-29 LAB — CBC
HCT: 42.1 % (ref 36.0–46.0)
Hemoglobin: 15 g/dL (ref 12.0–15.0)
MCH: 31.6 pg (ref 26.0–34.0)
MCHC: 35.6 g/dL (ref 30.0–36.0)
MCV: 88.8 fL (ref 78.0–100.0)
Platelets: 273 10*3/uL (ref 150–400)
RBC: 4.74 MIL/uL (ref 3.87–5.11)
RDW: 13.4 % (ref 11.5–15.5)
WBC: 16.2 10*3/uL — ABNORMAL HIGH (ref 4.0–10.5)

## 2011-01-29 LAB — URINALYSIS, ROUTINE W REFLEX MICROSCOPIC
Bilirubin Urine: NEGATIVE
Glucose, UA: >1000 mg/dL — AB
Ketones, ur: NEGATIVE mg/dL
Leukocytes, UA: NEGATIVE
Nitrite: POSITIVE — AB
Protein, ur: NEGATIVE mg/dL
Specific Gravity, Urine: 1.026 (ref 1.005–1.030)
Urobilinogen, UA: 0.2 mg/dL (ref 0.0–1.0)
pH: 5.5 (ref 5.0–8.0)

## 2011-01-29 LAB — DIFFERENTIAL
Basophils Absolute: 0.1 10*3/uL (ref 0.0–0.1)
Basophils Relative: 0 % (ref 0–1)
Eosinophils Absolute: 0.1 10*3/uL (ref 0.0–0.7)
Eosinophils Relative: 1 % (ref 0–5)
Lymphocytes Relative: 22 % (ref 12–46)
Lymphs Abs: 3.6 10*3/uL (ref 0.7–4.0)
Monocytes Absolute: 1 10*3/uL (ref 0.1–1.0)
Monocytes Relative: 6 % (ref 3–12)
Neutro Abs: 11.4 10*3/uL — ABNORMAL HIGH (ref 1.7–7.7)
Neutrophils Relative %: 71 % (ref 43–77)

## 2011-01-29 LAB — POCT I-STAT, CHEM 8
BUN: 21 mg/dL (ref 6–23)
Calcium, Ion: 1.2 mmol/L (ref 1.12–1.32)
Chloride: 98 mEq/L (ref 96–112)
Creatinine, Ser: 0.9 mg/dL (ref 0.50–1.10)
Glucose, Bld: 313 mg/dL — ABNORMAL HIGH (ref 70–99)
HCT: 51 % — ABNORMAL HIGH (ref 36.0–46.0)
Hemoglobin: 17.3 g/dL — ABNORMAL HIGH (ref 12.0–15.0)
Potassium: 3.3 mEq/L — ABNORMAL LOW (ref 3.5–5.1)
Sodium: 132 mEq/L — ABNORMAL LOW (ref 135–145)
TCO2: 23 mmol/L (ref 0–100)

## 2011-01-29 LAB — GLUCOSE, CAPILLARY
Glucose-Capillary: 269 mg/dL — ABNORMAL HIGH (ref 70–99)
Glucose-Capillary: 261 mg/dL — ABNORMAL HIGH (ref 70–99)
Glucose-Capillary: 170 mg/dL — ABNORMAL HIGH (ref 70–99)
Glucose-Capillary: 197 mg/dL — ABNORMAL HIGH (ref 70–99)

## 2011-01-29 LAB — URINE MICROSCOPIC-ADD ON

## 2011-02-01 LAB — GLUCOSE, CAPILLARY: Glucose-Capillary: 331 mg/dL — ABNORMAL HIGH (ref 70–99)

## 2011-02-12 LAB — I-STAT 8, (EC8 V) (CONVERTED LAB)
Acid-base deficit: 2
BUN: 9
Bicarbonate: 23.5
Chloride: 106
Glucose, Bld: 134 — ABNORMAL HIGH
HCT: 43
Hemoglobin: 14.6
Operator id: 151321
Potassium: 3.5
Sodium: 138
TCO2: 25
pCO2, Ven: 43.2 — ABNORMAL LOW
pH, Ven: 7.342 — ABNORMAL HIGH

## 2011-02-12 LAB — DIFFERENTIAL
Basophils Absolute: 0
Basophils Relative: 0
Eosinophils Absolute: 0.1
Eosinophils Relative: 1
Lymphocytes Relative: 17
Lymphs Abs: 2.1
Monocytes Absolute: 0.7
Monocytes Relative: 6
Neutro Abs: 9.7 — ABNORMAL HIGH
Neutrophils Relative %: 77

## 2011-02-12 LAB — CBC
HCT: 40.3
Hemoglobin: 14.1
MCHC: 35
MCV: 91.4
Platelets: 249
RBC: 4.41
RDW: 13.4
WBC: 12.6 — ABNORMAL HIGH

## 2011-02-12 LAB — POCT I-STAT CREATININE
Creatinine, Ser: 0.8
Operator id: 151321

## 2011-02-12 LAB — HEPATIC FUNCTION PANEL
ALT: 33
AST: 72 — ABNORMAL HIGH
Albumin: 3.4 — ABNORMAL LOW
Alkaline Phosphatase: 86
Bilirubin, Direct: 0.2
Indirect Bilirubin: 0.3
Total Bilirubin: 0.5
Total Protein: 6.7

## 2011-02-12 LAB — LIPASE, BLOOD: Lipase: 19

## 2011-02-24 LAB — POCT I-STAT, CHEM 8
BUN: 13
Calcium, Ion: 1.19
Chloride: 105
Creatinine, Ser: 0.7
Glucose, Bld: 100 — ABNORMAL HIGH
HCT: 42
Hemoglobin: 14.3
Potassium: 3.9
Sodium: 137
TCO2: 27

## 2011-02-24 LAB — DIFFERENTIAL
Basophils Absolute: 0
Basophils Relative: 1
Eosinophils Absolute: 0.1
Eosinophils Relative: 1
Lymphocytes Relative: 27
Lymphs Abs: 2.4
Monocytes Absolute: 0.5
Monocytes Relative: 6
Neutro Abs: 5.8
Neutrophils Relative %: 65

## 2011-02-24 LAB — CBC
HCT: 40.8
Hemoglobin: 14.1
MCHC: 34.6
MCV: 91.8
Platelets: 226
RBC: 4.45
RDW: 13.5
WBC: 8.9

## 2011-03-01 LAB — CULTURE, ROUTINE-ABSCESS: Gram Stain: NONE SEEN

## 2011-03-04 ENCOUNTER — Other Ambulatory Visit (HOSPITAL_COMMUNITY): Payer: Self-pay | Admitting: Family Medicine

## 2011-03-04 DIAGNOSIS — Z1231 Encounter for screening mammogram for malignant neoplasm of breast: Secondary | ICD-10-CM

## 2011-03-04 LAB — URINE MICROSCOPIC-ADD ON

## 2011-03-04 LAB — URINALYSIS, ROUTINE W REFLEX MICROSCOPIC
Bilirubin Urine: NEGATIVE
Glucose, UA: NEGATIVE
Hgb urine dipstick: NEGATIVE
Ketones, ur: NEGATIVE
Nitrite: NEGATIVE
Protein, ur: NEGATIVE
Specific Gravity, Urine: 1.021
Urobilinogen, UA: 0.2
pH: 6

## 2011-03-04 LAB — COMPREHENSIVE METABOLIC PANEL
ALT: 17
AST: 17
Albumin: 3.4 — ABNORMAL LOW
Alkaline Phosphatase: 99
BUN: 18
CO2: 30
Calcium: 9.6
Chloride: 97
Creatinine, Ser: 0.62
GFR calc Af Amer: >60
GFR calc non Af Amer: >60
Glucose, Bld: 104 — ABNORMAL HIGH
Potassium: 4.3
Sodium: 134 — ABNORMAL LOW
Total Bilirubin: 0.7
Total Protein: 6.6

## 2011-03-04 LAB — DIFFERENTIAL
Basophils Absolute: 0
Basophils Relative: 0
Eosinophils Absolute: 0.2
Eosinophils Relative: 2
Lymphocytes Relative: 31
Lymphs Abs: 3.9 — ABNORMAL HIGH
Monocytes Absolute: 0.9 — ABNORMAL HIGH
Monocytes Relative: 7
Neutro Abs: 7.6
Neutrophils Relative %: 60

## 2011-03-04 LAB — CBC
HCT: 41.2
Hemoglobin: 14.6
MCHC: 35.3
MCV: 91.7
Platelets: 299
RBC: 4.49
RDW: 13.8
WBC: 12.5 — ABNORMAL HIGH

## 2011-03-04 LAB — POCT PREGNANCY, URINE
Operator id: 256701
Preg Test, Ur: NEGATIVE

## 2011-04-02 ENCOUNTER — Ambulatory Visit (HOSPITAL_COMMUNITY): Payer: No Typology Code available for payment source

## 2011-05-05 ENCOUNTER — Ambulatory Visit (HOSPITAL_COMMUNITY)
Admission: RE | Admit: 2011-05-05 | Discharge: 2011-05-05 | Disposition: A | Discharge status: Home / Self Care | Payer: Medicaid Other | Source: Ambulatory Visit | Attending: Family Medicine | Admitting: Family Medicine

## 2011-05-05 DIAGNOSIS — Z1231 Encounter for screening mammogram for malignant neoplasm of breast: Secondary | ICD-10-CM | POA: Insufficient documentation

## 2011-08-05 ENCOUNTER — Emergency Department (HOSPITAL_COMMUNITY)
Admission: EM | Admit: 2011-08-05 | Discharge: 2011-08-05 | Disposition: A | Discharge status: Home / Self Care | Payer: Medicaid Other | Attending: Emergency Medicine | Admitting: Emergency Medicine

## 2011-08-05 ENCOUNTER — Encounter (HOSPITAL_COMMUNITY): Payer: Self-pay | Admitting: Emergency Medicine

## 2011-08-05 ENCOUNTER — Emergency Department (HOSPITAL_COMMUNITY): Payer: Medicaid Other

## 2011-08-05 DIAGNOSIS — M545 Low back pain, unspecified: Secondary | ICD-10-CM | POA: Insufficient documentation

## 2011-08-05 DIAGNOSIS — E119 Type 2 diabetes mellitus without complications: Secondary | ICD-10-CM | POA: Insufficient documentation

## 2011-08-05 DIAGNOSIS — M5432 Sciatica, left side: Secondary | ICD-10-CM

## 2011-08-05 DIAGNOSIS — M5430 Sciatica, unspecified side: Secondary | ICD-10-CM | POA: Insufficient documentation

## 2011-08-05 DIAGNOSIS — I10 Essential (primary) hypertension: Secondary | ICD-10-CM | POA: Insufficient documentation

## 2011-08-05 HISTORY — DX: Unspecified osteoarthritis, unspecified site: M19.90

## 2011-08-05 HISTORY — DX: Hyperlipidemia, unspecified: E78.5

## 2011-08-05 HISTORY — DX: Essential (primary) hypertension: I10

## 2011-08-05 LAB — URINALYSIS, ROUTINE W REFLEX MICROSCOPIC
Bilirubin Urine: NEGATIVE
Glucose, UA: NEGATIVE mg/dL
Hgb urine dipstick: NEGATIVE
Leukocytes, UA: NEGATIVE
Nitrite: NEGATIVE
Protein, ur: NEGATIVE mg/dL
Specific Gravity, Urine: 1.023 (ref 1.005–1.030)
Urobilinogen, UA: 1 mg/dL (ref 0.0–1.0)
pH: 6.5 (ref 5.0–8.0)

## 2011-08-05 MED ORDER — DIAZEPAM 5 MG PO TABS
5.0000 mg | ORAL_TABLET | Freq: Once | ORAL | Status: AC
  Administered 2011-08-05: 5 mg via ORAL
  Filled 2011-08-05: qty 1

## 2011-08-05 MED ORDER — MORPHINE SULFATE 4 MG/ML IJ SOLN
4.0000 mg | Freq: Once | INTRAMUSCULAR | Status: AC
  Administered 2011-08-05: 4 mg via INTRAMUSCULAR
  Filled 2011-08-05: qty 1

## 2011-08-05 MED ORDER — HYDROCODONE-ACETAMINOPHEN 5-325 MG PO TABS: 1.0000 | ORAL_TABLET | ORAL | Status: AC | PRN

## 2011-08-05 MED ORDER — METHYLPREDNISOLONE 4 MG PO KIT: PACK | ORAL | Status: DC

## 2011-08-05 MED ORDER — KETOROLAC TROMETHAMINE 60 MG/2ML IM SOLN
60.0000 mg | Freq: Once | INTRAMUSCULAR | Status: AC
  Administered 2011-08-05: 60 mg via INTRAMUSCULAR
  Filled 2011-08-05: qty 2

## 2011-08-05 MED ORDER — DIAZEPAM 5 MG PO TABS: 5.0000 mg | ORAL_TABLET | Freq: Two times a day (BID) | ORAL | Status: AC

## 2011-08-05 NOTE — ED Notes (Signed)
Pt returned from xray

## 2011-08-05 NOTE — ED Provider Notes (Signed)
History     CSN: [id number]  Arrival date & time 08/05/11  1128   First MD Initiated Contact with Patient 08/05/11 1132      No chief complaint on file.   (Consider location/radiation/quality/duration/timing/severity/associated sxs/prior treatment) HPI History obtained from the patient. 51 year old female with past medical history of hypertension, hyperlipidemia, diabetes, arthritis presents with left low back pain, which has been present since yesterday and has worsened today. No known injury. Pain is currently a 5/10. She states that this worsens with movement and seems to improve with rest. Pain is described as "throbbing" in nature. She has occasional shooting pains which extend down her left leg to the top of her calf. She has not tried anything at home to treat this. Denies abdominal pain, nausea, vomiting, diarrhea. Denies any urinary symptoms or blood in her urine. She has no personal history of nephrolithiasis.  Denies saddle anesthesia, bowel/bladder dysfunction, inability to walk, numbness, weakness, or tingling in the legs. Denies fever/chills.  Past Medical History  Diagnosis Date  . Diabetes mellitus   . Arthritis   . Hypertension   . Hyperlipidemia     Past Surgical History  Procedure Date  . Cholecystectomy     No family history on file.  History  Substance Use Topics  . Smoking status: Current Everyday Smoker  . Smokeless tobacco: Not on file  . Alcohol Use: No    OB History    Grav Para Term Preterm Abortions TAB SAB Ect Mult Living                  Review of Systems  Constitutional: Negative for fever, chills, activity change and appetite change.  HENT: Negative for neck pain.   Cardiovascular: Negative for chest pain.  Gastrointestinal: Negative for nausea, vomiting, abdominal pain and diarrhea.  Musculoskeletal: Positive for back pain. Negative for myalgias and gait problem.  Skin: Negative for rash.  Neurological: Negative for weakness and  numbness.    Allergies  Review of patient's allergies indicates no known allergies.  Home Medications   Current Outpatient Rx  Name Route Sig Dispense Refill  . AMLODIPINE BESYLATE 5 MG PO TABS Oral Take 5 mg by mouth daily.    Marland Kitchen FLUOXETINE HCL 40 MG PO CAPS Oral Take 40 mg by mouth daily.    Marland Kitchen HYDROCHLOROTHIAZIDE 25 MG PO TABS  Take 1 tablet by mouth daily for blood pressure     . METFORMIN HCL 1000 MG PO TABS Oral Take 1,000 mg by mouth 2 (two) times daily with a meal.    . NAPROXEN SODIUM 550 MG PO TABS Oral Take 550 mg by mouth 2 (two) times daily with a meal.    . OMEPRAZOLE 40 MG PO CPDR Oral Take 40 mg by mouth daily.    Marland Kitchen SAXAGLIPTIN HCL 5 MG PO TABS Oral Take 5 mg by mouth daily.    Marland Kitchen SIMVASTATIN 20 MG PO TABS  Take 1 tablet daily for cholesterol control     . SOLIFENACIN SUCCINATE 5 MG PO TABS Oral Take 10 mg by mouth daily.    Marland Kitchen VALSARTAN-HYDROCHLOROTHIAZIDE 320-12.5 MG PO TABS Oral Take 1 tablet by mouth daily.      BP 117/70  Pulse 78  Temp(Src) 97.9 F (36.6 C) (Oral)  Resp 20  SpO2 100%  Physical Exam  Vitals reviewed. Constitutional: She appears well-developed and well-nourished. No distress.  HENT:  Head: Normocephalic and atraumatic.  Neck: Normal range of motion.  Cardiovascular: Normal rate, regular  rhythm and normal heart sounds.   Pulmonary/Chest: Effort normal and breath sounds normal. She has no wheezes. She exhibits no tenderness.  Abdominal: Soft. Bowel sounds are normal. There is no tenderness. There is no rebound and no guarding.       No CVA tenderness  Musculoskeletal:       Back:       Nontender to palpation over midline lumbar spine. No palpable crepitus, step-off, deformity. There is no palpable spasm of the paraspinal muscles, although she is tender to palpation over low left lateral back. Lower extremity is neurovascularly intact with sensory intact to light touch bilaterally. Strength is 5 out of 5 on dorsi/plantar flexion. Achilles  reflexes 2+ bilaterally.  Neurological: She is alert.  Skin: Skin is warm and dry. No rash noted. She is not diaphoretic.  Psychiatric: She has a normal mood and affect.    ED Course  Procedures (including critical care time)  Labs Reviewed  URINALYSIS, ROUTINE W REFLEX MICROSCOPIC - Abnormal; Notable for the following:    Ketones, ur TRACE (*)    All other components within normal limits   Dg Lumbar Spine Complete  08/05/2011  *RADIOLOGY REPORT*  Clinical Data: Low back pain.  Several falls.  LUMBAR SPINE - COMPLETE 4+ VIEW  Comparison: 10/08/2010  Findings: Five lumbar type vertebral bodies.  Sacroiliac joints are symmetric.  Maintenance of vertebral body height and alignment. Aortic atherosclerosis, age advanced.  Degenerative disc disease at the L1-L2 level is moderate and age advanced.  More mild spondylosis at other levels. Maintenance of vertebral body height and alignment.  IMPRESSION: Age advanced spondylosis, without acute finding.  Aortic atherosclerosis, also age advanced.  Original Report Authenticated By: Consuello Bossier, M.D.     1. Sciatica of left side       MDM  Given location and nature of patient's pain, I do not suspect nephrolithiasis.  Pain is not colicky in nature, and worsens with movement. She has a positive straight leg raise. There is evidence of advanced spondylosis on lumbar spine x-ray. Urinalysis is essentially normal. Patient's pain was well-controlled in the ED with morphine and diazepam. We'll plan to send her home with prescriptions for Norco and diazepam. Will avoid steroids as she is diabetic. Patient was advised to avoid heavy lifting. Was advised to use ice to the area. Return precautions discussed.       Grant Fontana, [place] 08/05/11 1733

## 2011-08-05 NOTE — Discharge Instructions (Signed)
Your back pain is likely due to the arthritis in your back and a condition called sciatica. Please followup with your family doctor or [facility] if your pain is not improving. If you develop numbness or weakness in your legs, trouble walking, groin numbness, bowel or bladder incontinence, or any other worrisome symptoms, please return to the ED.  RESOURCE GUIDE  Dental Problems  Patients with Medicaid: [facility] [phone] [address]                                           [phone] [address] Phone:  [phone]                                                  Phone:  [phone]  If unable to pay or uninsured, contact:  [facility] or [facility]. to become qualified for the adult dental clinic.  Chronic Pain Problems Contact [facility]  [phone] Patients need to be referred by their primary care doctor.  Insufficient Money for Medicine Contact [employer]:  call "211" or [facility] 415-670-4385.  No Primary Care Doctor Call [facility]  248-738-1314 Other agencies that provide inexpensive medical care    [facility]  2620067733    [facility]  [phone]    [facility]  [phone]    [facility]  [phone]    [facility]  7788602477    [facility]  [phone]  Psychological Services [facility] Behavioral Health  [phone] [facility]  [phone] [facility] Mental Health   [phone] (emergency services 817-830-4506)  Substance Abuse Resources [facility]  938-004-4918 [facility] 2257168704 [facility] [place] 9132040758 Floydene Flock [phone] Residential & Outpatient Substance Abuse Program  7196209289  Abuse/Neglect [facility] Child Abuse Hotline 7246811452 [employer] Child Abuse Hotline 210-500-4972 (After Hours)  Emergency Shelter [name] (380)312-7415  M[facility] [place] [facility] (201)157-3215 [facility] (816)[phone]  MRSA Hotline #:   512-079-1543    [facility] Resources  [facility]     [employer]                          [facility] Dept. [address] [place]                       [address]      [address] [place] [address]  [place]                                                Cristobal Goldmann Phone:  [phone]  Phone:  [phone]                 Phone:  [phone]  [facility] Phone:  [phone]  [facility] Child Abuse Hotline 819-238-1681 (845) [phone] (After Hours)  Sciatica Sciatica is a weakness and/or changes in sensation (tingling, jolts, hot and cold, numbness) along the path the sciatic nerve travels. Irritation or damage to lumbar nerve roots is often also referred to as lumbar radiculopathy.  Lumbar radiculopathy (Sciatica) is the most common form of this problem. Radiculopathy can occur in any of the nerves coming out of the spinal cord. The problems caused depend on which nerves are involved. The sciatic nerve is the large nerve supplying the branches of nerves going from the hip to the toes. It often causes a numbness or weakness in the skin and/or muscles that the sciatic nerve serves. It also may cause symptoms (problems) of pain, burning, tingling, or electric shock-like feelings in the path of this nerve. This usually comes from injury to the fibers that make up the sciatic nerve. Some of these symptoms are low back pain and/or unpleasant feelings in the following areas:  From the mid-buttock down the back of the leg to the back of the knee.   And/or the outside of the calf and top of the foot.   And/or behind the inner ankle to the sole of the foot.  CAUSES   Herniated or slipped disc. Discs are the little cushions between the bones  in the back.   Pressure by the piriformis muscle in the buttock on the sciatic nerve (Piriformis Syndrome).   Misalignment of the bones in the lower back and buttocks (Sacroiliac Joint Derangement).   Narrowing of the spinal canal that puts pressure on or pinches the fibers that make up the sciatic nerve.   A slipped vertebra that is out of line with those above or beneath it.   Abnormality of the nervous system itself so that nerve fibers do not transmit signals properly, especially to feet and calves (neuropathy).   Tumor (this is rare).  Your caregiver can usually determine the cause of your sciatica and begin the treatment most likely to help you. TREATMENT  Taking over-the-counter painkillers, physical therapy, rest, exercise, spinal manipulation, and injections of anesthetics and/or steroids may be used. Surgery, acupuncture, and Yoga can also be effective. Mind over matter techniques, mental imagery, and changing factors such as your bed, chair, desk height, posture, and activities are other treatments that may be helpful. You and your caregiver can help determine what is best for you. With proper diagnosis, the cause of most sciatica can be identified and removed. Communication and cooperation between your caregiver and you is essential. If you are not successful immediately, do not be discouraged. With time, a proper treatment can be found that will make you comfortable. HOME CARE INSTRUCTIONS   If the pain is coming from a problem in the back, applying ice to that area for 15 to 20 minutes, 3 to 4 times per day while awake, may be helpful. Put the ice in a plastic bag. Place a towel between the bag of ice and your skin.   You may exercise or perform your usual activities if these do not aggravate your pain, or as suggested by your caregiver.   Only take over-the-counter or prescription medicines for pain, discomfort, or fever as directed by your caregiver.   If your caregiver has  given you a follow-up appointment, it  is very important to keep that appointment. Not keeping the appointment could result in a chronic or permanent injury, pain, and disability. If there is any problem keeping the appointment, you must call back to this facility for assistance.  SEEK IMMEDIATE MEDICAL CARE IF:   You experience loss of control of bowel or bladder.   You have increasing weakness in the trunk, buttocks, or legs.   There is numbness in any areas from the hip down to the toes.   You have difficulty walking or keeping your balance.   You have any of the above, with fever or forceful vomiting.  Document Released: 05/04/2001 Document Revised: 04/29/2011 Document Reviewed: 12/22/2007 [facility] Patient Information 2012 [place], [place].

## 2011-08-05 NOTE — ED Notes (Signed)
Pt c/o of left side flank pain that started yesterday and is worse today. Pt states pain is 5/10. Pt reports nausea but no vomiting. Pt denies any urinary changes or symptoms.

## 2011-08-07 NOTE — ED Provider Notes (Signed)
Medical screening examination/treatment/procedure(s) were performed by non-physician practitioner and as supervising physician I was immediately available for consultation/collaboration.  Cliffard Hair R Feven Alderfer, MD 08/07/11 0702 

## 2011-08-18 ENCOUNTER — Encounter (HOSPITAL_COMMUNITY): Payer: Self-pay | Admitting: Emergency Medicine

## 2011-08-18 ENCOUNTER — Emergency Department (HOSPITAL_COMMUNITY)
Admission: EM | Admit: 2011-08-18 | Discharge: 2011-08-18 | Disposition: A | Discharge status: Home / Self Care | Payer: Medicaid Other | Attending: Emergency Medicine | Admitting: Emergency Medicine

## 2011-08-18 DIAGNOSIS — I10 Essential (primary) hypertension: Secondary | ICD-10-CM | POA: Insufficient documentation

## 2011-08-18 DIAGNOSIS — K5289 Other specified noninfective gastroenteritis and colitis: Secondary | ICD-10-CM | POA: Insufficient documentation

## 2011-08-18 DIAGNOSIS — R1110 Vomiting, unspecified: Secondary | ICD-10-CM | POA: Insufficient documentation

## 2011-08-18 DIAGNOSIS — K529 Noninfective gastroenteritis and colitis, unspecified: Secondary | ICD-10-CM

## 2011-08-18 DIAGNOSIS — E119 Type 2 diabetes mellitus without complications: Secondary | ICD-10-CM | POA: Insufficient documentation

## 2011-08-18 DIAGNOSIS — R109 Unspecified abdominal pain: Secondary | ICD-10-CM | POA: Insufficient documentation

## 2011-08-18 DIAGNOSIS — R197 Diarrhea, unspecified: Secondary | ICD-10-CM | POA: Insufficient documentation

## 2011-08-18 LAB — DIFFERENTIAL
Basophils Absolute: 0 10*3/uL (ref 0.0–0.1)
Basophils Relative: 0 % (ref 0–1)
Eosinophils Absolute: 0.1 10*3/uL (ref 0.0–0.7)
Eosinophils Relative: 1 % (ref 0–5)
Lymphocytes Relative: 6 % — ABNORMAL LOW (ref 12–46)
Lymphs Abs: 0.8 10*3/uL (ref 0.7–4.0)
Monocytes Absolute: 0.4 10*3/uL (ref 0.1–1.0)
Monocytes Relative: 3 % (ref 3–12)
Neutro Abs: 11.8 10*3/uL — ABNORMAL HIGH (ref 1.7–7.7)
Neutrophils Relative %: 90 % — ABNORMAL HIGH (ref 43–77)

## 2011-08-18 LAB — COMPREHENSIVE METABOLIC PANEL
ALT: 13 U/L (ref 0–35)
AST: 14 U/L (ref 0–37)
Albumin: 3.8 g/dL (ref 3.5–5.2)
Alkaline Phosphatase: 125 U/L — ABNORMAL HIGH (ref 39–117)
BUN: 23 mg/dL (ref 6–23)
CO2: 26 mEq/L (ref 19–32)
Calcium: 9.6 mg/dL (ref 8.4–10.5)
Chloride: 102 mEq/L (ref 96–112)
Creatinine, Ser: 0.53 mg/dL (ref 0.50–1.10)
GFR calc Af Amer: >90 mL/min (ref >90)
GFR calc non Af Amer: >90 mL/min (ref >90)
Glucose, Bld: 180 mg/dL — ABNORMAL HIGH (ref 70–99)
Potassium: 3.8 mEq/L (ref 3.5–5.1)
Sodium: 140 mEq/L (ref 135–145)
Total Bilirubin: 0.3 mg/dL (ref 0.3–1.2)
Total Protein: 7.7 g/dL (ref 6.0–8.3)

## 2011-08-18 LAB — CBC
HCT: 45.7 % (ref 36.0–46.0)
Hemoglobin: 15.5 g/dL — ABNORMAL HIGH (ref 12.0–15.0)
MCH: 30.8 pg (ref 26.0–34.0)
MCHC: 33.9 g/dL (ref 30.0–36.0)
MCV: 90.9 fL (ref 78.0–100.0)
Platelets: 245 10*3/uL (ref 150–400)
RBC: 5.03 MIL/uL (ref 3.87–5.11)
RDW: 14.2 % (ref 11.5–15.5)
WBC: 13.2 10*3/uL — ABNORMAL HIGH (ref 4.0–10.5)

## 2011-08-18 MED ORDER — ONDANSETRON 4 MG PO TBDP: 4.0000 mg | ORAL_TABLET | Freq: Three times a day (TID) | ORAL | Status: AC | PRN

## 2011-08-18 MED ORDER — SODIUM CHLORIDE 0.9 % IV SOLN
INTRAVENOUS | Status: DC
  Administered 2011-08-18: 1000 mL via INTRAVENOUS

## 2011-08-18 MED ORDER — SODIUM CHLORIDE 0.9 % IV BOLUS (SEPSIS)
500.0000 mL | Freq: Once | INTRAVENOUS | Status: AC
  Administered 2011-08-18: 500 mL via INTRAVENOUS

## 2011-08-18 MED ORDER — SODIUM CHLORIDE 0.9 % IV BOLUS (SEPSIS)
1000.0000 mL | Freq: Once | INTRAVENOUS | Status: AC
  Administered 2011-08-18: 1000 mL via INTRAVENOUS

## 2011-08-18 MED ORDER — HYDROCODONE-ACETAMINOPHEN 5-325 MG PO TABS: 2.0000 | ORAL_TABLET | ORAL | Status: AC | PRN

## 2011-08-18 MED ORDER — HYDROMORPHONE HCL PF 1 MG/ML IJ SOLN
1.0000 mg | Freq: Once | INTRAMUSCULAR | Status: AC
  Administered 2011-08-18: 1 mg via INTRAVENOUS
  Filled 2011-08-18: qty 1

## 2011-08-18 MED ORDER — ONDANSETRON HCL 4 MG/2ML IJ SOLN
4.0000 mg | Freq: Once | INTRAMUSCULAR | Status: AC
  Administered 2011-08-18: 4 mg via INTRAVENOUS
  Filled 2011-08-18: qty 2

## 2011-08-18 NOTE — Discharge Instructions (Signed)
Abdominal Pain Many things can cause belly (abdominal) pain. Most times, the belly pain is not dangerous. The amount of belly pain does not tell how serious the problem may be. Many cases of belly pain can be watched and treated at home. HOME CARE   Do not take medicines that help you go poop (laxatives) unless told to by your doctor.   Only take medicine as told by your doctor.   Eat or drink as told by your doctor. Your doctor will tell you if you should be on a special diet.  GET HELP RIGHT AWAY IF:   The pain does not go away.   You have a fever.   You keep throwing up (vomiting).   The pain changes and is only in the right or left part of the belly.   You have bloody or tarry looking poop.  MAKE SURE YOU:   Understand these instructions.   Will watch your condition.   Will get help right away if you are not doing well or get worse.  Document Released: 10/27/2007 Document Revised: 04/29/2011 Document Reviewed: 05/26/2009 [facility] Patient Information 2012 [place], [place].Clear Liquid Diet The clear liquid dietconsists of foods that are liquid or will become liquid at room temperature.You should be able to see through the liquid and beverages. Examples of foods allowed on a clear liquid diet include fruit juice, broth or bouillon, gelatin, or frozen ice pops. The purpose of this diet is to provide necessary fluid, electrolytes such as sodium and potassium, and energy to keep the body functioning during times when you are not able to consume a regular diet.A clear liquid diet should not be continued for long periods of time as it is not nutritionally adequate.  REASONS FOR USING A CLEAR LIQUID DIET  In sudden onset (acute) conditions for a patient before or after surgery.   As the first step in oral feeding.   For fluid and electrolyte replacement in diarrheal diseases.   As a diet before certain medical tests are performed.  ADEQUACY The clear liquid diet is adequate  only in ascorbic acid, according to the Recommended Dietary Allowances of the [employer]. CHOOSING FOODS Breads and Starches  Allowed:  None are allowed.   Avoid: All are avoided.  Vegetables  Allowed:  Strained tomato or vegetable juice.   Avoid: Any others.  Fruit  Allowed:  Strained fruit juices and fruit drinks. Include 1 serving of citrus or vitamin C-enriched fruit juice daily.   Avoid: Any others.  Meat and Meat Substitutes  Allowed:  None are allowed.   Avoid: All are avoided.  Milk  Allowed:  None are allowed.   Avoid: All are avoided.  Soups and Combination Foods  Allowed:  Clear bouillon, broth, or strained broth-based soups.   Avoid: Any others.  Desserts and Sweets  Allowed:  Sugar, honey. High protein gelatin. Flavored gelatin, ices, or frozen ice pops that do not contain milk.   Avoid: Any others.  Fats and Oils  Allowed:  None are allowed.   Avoid: All are avoided.  Beverages  Allowed: Cereal beverages, coffee (regular or decaffeinated), tea, or soda at the discretion of your caregiver.   Avoid: Any others.  Condiments  Allowed:  Iodized salt.   Avoid: Any others, including pepper.  Supplements  Allowed:  Liquid nutrition beverages.   Avoid: Any others that contain lactose or fiber.  SAMPLE MEAL PLAN Breakfast  4 oz (120 mL) strained orange juice.    to  1 cup (125 to 250 mL) gelatin (plain or fortified).   1 cup (250 mL) beverage (coffee or tea).   Sugar, if desired.  Midmorning Snack   cup (125 mL) gelatin (plain or fortified).  Lunch  1 cup (250 mL) broth or consomm.   4 oz (120 mL) strained grapefruit juice.    cup (125 mL) gelatin (plain or fortified).   1 cup (250 mL) beverage (coffee or tea).   Sugar, if desired.  Midafternoon Snack   cup (125 mL) fruit ice.    cup (125 mL) strained fruit juice.  Dinner  1 cup (250 mL) broth or consomm.    cup (125 mL) cranberry juice.    cup (125  mL) flavored gelatin (plain or fortified).   1 cup (250 mL) beverage (coffee or tea).   Sugar, if desired.  Evening Snack  4 oz (120 mL) strained apple juice (vitamin C-fortified).    cup (125 mL) flavored gelatin (plain or fortified).  Document Released: 05/10/2005 Document Revised: 04/29/2011 Document Reviewed: 08/07/2010 [facility] Patient Information 2012 [place], [place].Diet for Diarrhea, Adult Having frequent, runny stools (diarrhea) has many causes. Diarrhea may be caused or worsened by food or drink. Diarrhea may be relieved by changing your diet. IF YOU ARE NOT TOLERATING SOLID FOODS:  Drink enough water and fluids to keep your urine clear or pale yellow.   Avoid sugary drinks and sodas as well as milk-based beverages.   Avoid beverages containing caffeine and alcohol.   You may try rehydrating beverages. You can make your own by following this recipe:    tsp table salt.    tsp baking soda.   ? tsp salt substitute (potassium chloride).   1 tbs + 1 tsp sugar.   1 qt water.  As your stools become more solid, you can start eating solid foods. Add foods one at a time. If a certain food causes your diarrhea to get worse, avoid that food and try other foods. A low fiber, low-fat, and lactose-free diet is recommended. Small, frequent meals may be better tolerated.  Starches  Allowed:  White, [place], and pita breads, plain rolls, buns, bagels. Plain muffins, matzo. Soda, saltine, or graham crackers. Pretzels, melba toast, zwieback. Cooked cereals made with water: cornmeal, farina, cream cereals. Dry cereals: refined corn, wheat, rice. Potatoes prepared any way without skins, refined macaroni, spaghetti, noodles, refined rice.   Avoid:  Bread, rolls, or crackers made with whole wheat, multi-grains, rye, bran seeds, nuts, or coconut. Corn tortillas or taco shells. Cereals containing whole grains, multi-grains, bran, coconut, nuts, or raisins. Cooked or dry oatmeal. Coarse wheat  cereals, granola. Cereals advertised as "high-fiber." Potato skins. Whole grain pasta, wild or brown rice. Popcorn. Sweet potatoes/yams. Sweet rolls, doughnuts, waffles, pancakes, sweet breads.  Vegetables  Allowed: Strained tomato and vegetable juices. Most well-cooked and canned vegetables without seeds. Fresh: Tender lettuce, cucumber without the skin, cabbage, spinach, bean sprouts.   Avoid: Fresh, cooked, or canned: Artichokes, baked beans, beet greens, broccoli, Brussels sprouts, corn, kale, legumes, peas, sweet potatoes. Cooked: Green or red cabbage, spinach. Avoid large servings of any vegetables, because vegetables shrink when cooked, and they contain more fiber per serving than fresh vegetables.  Fruit  Allowed: All fruit juices except prune juice. Cooked or canned: Apricots, applesauce, cantaloupe, cherries, fruit cocktail, grapefruit, grapes, kiwi, mandarin oranges, peaches, pears, plums, watermelon. Fresh: Apples without skin, ripe banana, grapes, cantaloupe, cherries, grapefruit, peaches, oranges, plums. Keep servings limited to  cup or 1 piece.  Avoid: Fresh: Apple with skin, apricots, mango, pears, raspberries, strawberries. Prune juice, stewed or dried prunes. Dried fruits, raisins, dates. Large servings of all fresh fruits.  Meat and Meat Substitutes  Allowed: Ground or well-cooked tender beef, ham, veal, lamb, pork, or poultry. Eggs, plain cheese. Fish, oysters, shrimp, lobster, other seafoods. Liver, organ meats.   Avoid: Tough, fibrous meats with gristle. Peanut butter, smooth or chunky. Cheese, nuts, seeds, legumes, dried peas, beans, lentils.  Milk  Allowed: Yogurt, lactose-free milk, kefir, drinkable yogurt, buttermilk, soy milk.   Avoid: Milk, chocolate milk, beverages made with milk, such as milk shakes.  Soups  Allowed: Bouillon, broth, or soups made from allowed foods. Any strained soup.   Avoid: Soups made from vegetables that are not allowed, cream or  milk-based soups.  Desserts and Sweets  Allowed: Sugar-free gelatin, sugar-free frozen ice pops made without sugar alcohol.   Avoid: Plain cakes and cookies, pie made with allowed fruit, pudding, custard, cream pie. Gelatin, fruit, ice, sherbet, frozen ice pops. Ice cream, ice milk without nuts. Plain hard candy, honey, jelly, molasses, syrup, sugar, chocolate syrup, gumdrops, marshmallows.  Fats and Oils  Allowed: Avoid any fats and oils.   Avoid: Seeds, nuts, olives, avocados. Margarine, butter, cream, mayonnaise, salad oils, plain salad dressings made from allowed foods. Plain gravy, crisp bacon without rind.  Beverages  Allowed: Water, decaffeinated teas, oral rehydration solutions, sugar-free beverages.   Avoid: Fruit juices, caffeinated beverages (coffee, tea, soda or pop), alcohol, sports drinks, or lemon-lime soda or pop.  Condiments  Allowed: Ketchup, mustard, horseradish, vinegar, cream sauce, cheese sauce, cocoa powder. Spices in moderation: allspice, basil, bay leaves, celery powder or leaves, cinnamon, cumin powder, curry powder, ginger, mace, marjoram, onion or garlic powder, oregano, paprika, parsley flakes, ground pepper, rosemary, sage, savory, tarragon, thyme, turmeric.   Avoid: Coconut, honey.  Weight Monitoring: Weigh yourself every day. You should weigh yourself in the morning after you urinate and before you eat breakfast. Wear the same amount of clothing when you weigh yourself. Record your weight daily. Bring your recorded weights to your clinic visits. Tell your caregiver right away if you have gained 3 lb/1.4 kg or more in 1 day, 5 lb/2.3 kg in a week, or whatever amount you were told to report. SEEK IMMEDIATE MEDICAL CARE IF:   You are unable to keep fluids down.   You start to throw up (vomit) or diarrhea keeps coming back (persistent).   Abdominal pain develops, increases, or can be felt in one place (localizes).   You have an oral temperature above 102 F  (38.9 C), not controlled by medicine.   Diarrhea contains blood or mucus.   You develop excessive weakness, dizziness, fainting, or extreme thirst.  MAKE SURE YOU:   Understand these instructions.   Will watch your condition.   Will get help right away if you are not doing well or get worse.  Document Released: 07/31/2003 Document Revised: 04/29/2011 Document Reviewed: 11/21/2008 [facility] Patient Information 2012 [place], [place].

## 2011-08-18 NOTE — ED Provider Notes (Signed)
History     CSN: [id number]  Arrival date & time 08/18/11  1748   First MD Initiated Contact with Patient 08/18/11 2014      Chief Complaint  Patient presents with  . Abdominal Pain  . Diarrhea     HPI Pt presenting to ed with c/o abdominal pain, positive nausea and vomiting and diarrhea. Pt states onset 13:30 today. Pt is alert and oriented at this time  Past Medical History  Diagnosis Date  . Diabetes mellitus   . Arthritis   . Hypertension   . Hyperlipidemia     Past Surgical History  Procedure Date  . Cholecystectomy     History reviewed. No pertinent family history.  History  Substance Use Topics  . Smoking status: Current Everyday Smoker  . Smokeless tobacco: Not on file  . Alcohol Use: No    OB History    Grav Para Term Preterm Abortions TAB SAB Ect Mult Living                  Review of Systems  All other systems reviewed and are negative.    Allergies  Review of patient's allergies indicates no known allergies.  Home Medications   Current Outpatient Rx  Name Route Sig Dispense Refill  . AMLODIPINE BESYLATE 5 MG PO TABS Oral Take 5 mg by mouth daily.    Marland Kitchen FLUOXETINE HCL 40 MG PO CAPS Oral Take 40 mg by mouth daily.    Marland Kitchen HYDROCHLOROTHIAZIDE 25 MG PO TABS  Take 1 tablet by mouth daily for blood pressure     . METFORMIN HCL 1000 MG PO TABS Oral Take 1,000 mg by mouth 2 (two) times daily with a meal.    . NAPROXEN SODIUM 550 MG PO TABS Oral Take 550 mg by mouth 2 (two) times daily with a meal.    . OMEPRAZOLE 40 MG PO CPDR Oral Take 40 mg by mouth daily.    Marland Kitchen SAXAGLIPTIN HCL 5 MG PO TABS Oral Take 5 mg by mouth daily.    Marland Kitchen SIMVASTATIN 20 MG PO TABS  Take 1 tablet daily for cholesterol control     . SOLIFENACIN SUCCINATE 5 MG PO TABS Oral Take 10 mg by mouth daily.    Marland Kitchen VALSARTAN-HYDROCHLOROTHIAZIDE 320-12.5 MG PO TABS Oral Take 1 tablet by mouth daily.    Marland Kitchen HYDROCODONE-ACETAMINOPHEN 5-325 MG PO TABS Oral Take 2 tablets by mouth every 4 (four)  hours as needed for pain. 10 tablet 0  . ONDANSETRON 4 MG PO TBDP Oral Take 1 tablet (4 mg total) by mouth every 8 (eight) hours as needed for nausea. 20 tablet 0    BP 133/83  Pulse 85  Temp(Src) 97.8 F (36.6 C) (Oral)  Resp 20  SpO2 96%  Physical Exam  Nursing note and vitals reviewed. Constitutional: She is oriented to person, place, and time. She appears well-developed and well-nourished. No distress.  HENT:  Head: Normocephalic and atraumatic.  Eyes: Pupils are equal, round, and reactive to light.  Neck: Normal range of motion.  Cardiovascular: Normal rate and intact distal pulses.   Pulmonary/Chest: No respiratory distress.  Abdominal: Normal appearance. She exhibits no distension. There is tenderness. There is no rebound and no guarding.  Musculoskeletal: Normal range of motion.  Neurological: She is alert and oriented to person, place, and time. No cranial nerve deficit.  Skin: Skin is warm and dry. No rash noted.  Psychiatric: She has a normal mood and affect. Her behavior is  normal.    ED Course  Procedures (including critical care time) Scheduled Meds:    .  HYDROmorphone (DILAUDID) injection  1 mg Intravenous Once  . ondansetron  4 mg Intravenous Once  . sodium chloride  1,000 mL Intravenous Once  . sodium chloride  500 mL Intravenous Once   Continuous Infusions:    . sodium chloride 1,000 mL (08/18/11 2212)   PRN Meds:.  Labs Reviewed  CBC - Abnormal; Notable for the following:    WBC 13.2 (*)    Hemoglobin 15.5 (*)    All other components within normal limits  DIFFERENTIAL - Abnormal; Notable for the following:    Neutrophils Relative 90 (*)    Neutro Abs 11.8 (*)    Lymphocytes Relative 6 (*)    All other components within normal limits  COMPREHENSIVE METABOLIC PANEL - Abnormal; Notable for the following:    Glucose, Bld 180 (*)    Alkaline Phosphatase 125 (*)    All other components within normal limits   No results found.   1. Vomiting  and diarrhea   2. Abdominal  pain, other specified site   3. Gastroenteritis       MDM  After treatment in the ED the patient feels back to baseline and wants to go home.         Nelia Shi, MD 08/18/11 [phone]

## 2011-08-18 NOTE — ED Notes (Signed)
Pt presenting to ed with c/o abdominal pain, positive nausea and vomiting and diarrhea. Pt states onset 13:30 today. Pt is alert and oriented at this time

## 2011-09-03 ENCOUNTER — Other Ambulatory Visit: Payer: Self-pay | Admitting: Family Medicine

## 2011-09-03 DIAGNOSIS — M545 Low back pain, unspecified: Secondary | ICD-10-CM

## 2011-09-09 ENCOUNTER — Ambulatory Visit
Admission: RE | Admit: 2011-09-09 | Discharge: 2011-09-09 | Disposition: A | Discharge status: Home / Self Care | Payer: Medicaid Other | Source: Ambulatory Visit | Attending: Family Medicine | Admitting: Family Medicine

## 2011-09-09 DIAGNOSIS — M545 Low back pain, unspecified: Secondary | ICD-10-CM

## 2011-10-20 ENCOUNTER — Ambulatory Visit: Payer: Medicaid Other | Attending: Family Medicine | Admitting: Physical Therapy

## 2011-10-20 DIAGNOSIS — M545 Low back pain, unspecified: Secondary | ICD-10-CM | POA: Insufficient documentation

## 2011-10-20 DIAGNOSIS — M6281 Muscle weakness (generalized): Secondary | ICD-10-CM | POA: Insufficient documentation

## 2011-10-20 DIAGNOSIS — M25559 Pain in unspecified hip: Secondary | ICD-10-CM | POA: Insufficient documentation

## 2011-10-20 DIAGNOSIS — IMO0001 Reserved for inherently not codable concepts without codable children: Secondary | ICD-10-CM | POA: Insufficient documentation

## 2011-10-20 DIAGNOSIS — M25569 Pain in unspecified knee: Secondary | ICD-10-CM | POA: Insufficient documentation

## 2011-10-27 ENCOUNTER — Encounter: Payer: Medicaid Other | Admitting: Physical Therapy

## 2012-03-27 ENCOUNTER — Other Ambulatory Visit (HOSPITAL_COMMUNITY): Payer: Self-pay | Admitting: Family Medicine

## 2012-03-27 DIAGNOSIS — Z1231 Encounter for screening mammogram for malignant neoplasm of breast: Secondary | ICD-10-CM

## 2012-05-08 ENCOUNTER — Ambulatory Visit (HOSPITAL_COMMUNITY)
Admission: RE | Admit: 2012-05-08 | Discharge: 2012-05-08 | Disposition: A | Discharge status: Home / Self Care | Payer: Medicaid Other | Source: Ambulatory Visit | Attending: Family Medicine | Admitting: Family Medicine

## 2012-05-08 DIAGNOSIS — Z1231 Encounter for screening mammogram for malignant neoplasm of breast: Secondary | ICD-10-CM

## 2012-07-27 ENCOUNTER — Encounter (HOSPITAL_COMMUNITY): Payer: Self-pay | Admitting: Emergency Medicine

## 2012-07-27 ENCOUNTER — Emergency Department (HOSPITAL_COMMUNITY)
Admission: EM | Admit: 2012-07-27 | Discharge: 2012-07-27 | Disposition: A | Discharge status: Home / Self Care | Payer: No Typology Code available for payment source | Attending: Emergency Medicine | Admitting: Emergency Medicine

## 2012-07-27 DIAGNOSIS — F17200 Nicotine dependence, unspecified, uncomplicated: Secondary | ICD-10-CM | POA: Insufficient documentation

## 2012-07-27 DIAGNOSIS — Y92410 Unspecified street and highway as the place of occurrence of the external cause: Secondary | ICD-10-CM | POA: Insufficient documentation

## 2012-07-27 DIAGNOSIS — E119 Type 2 diabetes mellitus without complications: Secondary | ICD-10-CM | POA: Insufficient documentation

## 2012-07-27 DIAGNOSIS — Z8739 Personal history of other diseases of the musculoskeletal system and connective tissue: Secondary | ICD-10-CM | POA: Insufficient documentation

## 2012-07-27 DIAGNOSIS — Z79899 Other long term (current) drug therapy: Secondary | ICD-10-CM | POA: Insufficient documentation

## 2012-07-27 DIAGNOSIS — E785 Hyperlipidemia, unspecified: Secondary | ICD-10-CM | POA: Insufficient documentation

## 2012-07-27 DIAGNOSIS — Y939 Activity, unspecified: Secondary | ICD-10-CM | POA: Insufficient documentation

## 2012-07-27 DIAGNOSIS — I10 Essential (primary) hypertension: Secondary | ICD-10-CM | POA: Insufficient documentation

## 2012-07-27 DIAGNOSIS — IMO0002 Reserved for concepts with insufficient information to code with codable children: Secondary | ICD-10-CM | POA: Insufficient documentation

## 2012-07-27 DIAGNOSIS — M549 Dorsalgia, unspecified: Secondary | ICD-10-CM

## 2012-07-27 MED ORDER — CYCLOBENZAPRINE HCL 10 MG PO TABS
10.0000 mg | ORAL_TABLET | Freq: Once | ORAL | Status: AC
  Administered 2012-07-27: 10 mg via ORAL
  Filled 2012-07-27: qty 1

## 2012-07-27 MED ORDER — IBUPROFEN 400 MG PO TABS: 400.0000 mg | ORAL_TABLET | Freq: Four times a day (QID) | ORAL | Status: DC | PRN

## 2012-07-27 MED ORDER — IBUPROFEN 200 MG PO TABS
400.0000 mg | ORAL_TABLET | Freq: Once | ORAL | Status: AC
  Administered 2012-07-27: 400 mg via ORAL
  Filled 2012-07-27: qty 2

## 2012-07-27 MED ORDER — CYCLOBENZAPRINE HCL 10 MG PO TABS: 10.0000 mg | ORAL_TABLET | Freq: Two times a day (BID) | ORAL | Status: DC | PRN

## 2012-07-27 NOTE — Progress Notes (Signed)
Pt states pcp is [facility] EP[facility] updated  

## 2012-07-27 NOTE — ED Provider Notes (Signed)
Medical screening examination/treatment/procedure(s) were performed by non-physician practitioner and as supervising physician I was immediately available for consultation/collaboration. Iva Knapp, MD, FACEP   Iva L Knapp, MD 07/27/12 1617 
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
## 2012-12-18 ENCOUNTER — Emergency Department (HOSPITAL_COMMUNITY): Payer: Medicaid Other

## 2012-12-18 ENCOUNTER — Emergency Department (HOSPITAL_COMMUNITY)
Admission: EM | Admit: 2012-12-18 | Discharge: 2012-12-18 | Discharge status: Against Medical Advice | Payer: Medicaid Other | Attending: Emergency Medicine | Admitting: Emergency Medicine

## 2012-12-18 ENCOUNTER — Encounter (HOSPITAL_COMMUNITY): Payer: Self-pay

## 2012-12-18 DIAGNOSIS — E785 Hyperlipidemia, unspecified: Secondary | ICD-10-CM | POA: Insufficient documentation

## 2012-12-18 DIAGNOSIS — N61 Mastitis without abscess: Secondary | ICD-10-CM | POA: Insufficient documentation

## 2012-12-18 DIAGNOSIS — Z79899 Other long term (current) drug therapy: Secondary | ICD-10-CM | POA: Insufficient documentation

## 2012-12-18 DIAGNOSIS — I10 Essential (primary) hypertension: Secondary | ICD-10-CM | POA: Insufficient documentation

## 2012-12-18 DIAGNOSIS — Z791 Long term (current) use of non-steroidal anti-inflammatories (NSAID): Secondary | ICD-10-CM | POA: Insufficient documentation

## 2012-12-18 DIAGNOSIS — M129 Arthropathy, unspecified: Secondary | ICD-10-CM | POA: Insufficient documentation

## 2012-12-18 DIAGNOSIS — E119 Type 2 diabetes mellitus without complications: Secondary | ICD-10-CM | POA: Insufficient documentation

## 2012-12-18 DIAGNOSIS — F17200 Nicotine dependence, unspecified, uncomplicated: Secondary | ICD-10-CM | POA: Insufficient documentation

## 2012-12-18 DIAGNOSIS — N611 Abscess of the breast and nipple: Secondary | ICD-10-CM

## 2012-12-18 LAB — BASIC METABOLIC PANEL
BUN: 17 mg/dL (ref 6–23)
CO2: 28 mEq/L (ref 19–32)
Calcium: 9.9 mg/dL (ref 8.4–10.5)
Chloride: 103 mEq/L (ref 96–112)
Creatinine, Ser: 0.42 mg/dL — ABNORMAL LOW (ref 0.50–1.10)
GFR calc Af Amer: >90 mL/min (ref >90)
GFR calc non Af Amer: >90 mL/min (ref >90)
Glucose, Bld: 264 mg/dL — ABNORMAL HIGH (ref 70–99)
Potassium: 4 mEq/L (ref 3.5–5.1)
Sodium: 138 mEq/L (ref 135–145)

## 2012-12-18 LAB — CBC WITH DIFFERENTIAL/PLATELET
Basophils Absolute: 0 10*3/uL (ref 0.0–0.1)
Basophils Relative: 0 % (ref 0–1)
Eosinophils Absolute: 0.1 10*3/uL (ref 0.0–0.7)
Eosinophils Relative: 1 % (ref 0–5)
HCT: 40.2 % (ref 36.0–46.0)
Hemoglobin: 14 g/dL (ref 12.0–15.0)
Lymphocytes Relative: 24 % (ref 12–46)
Lymphs Abs: 2.2 10*3/uL (ref 0.7–4.0)
MCH: 31.7 pg (ref 26.0–34.0)
MCHC: 34.8 g/dL (ref 30.0–36.0)
MCV: 91.2 fL (ref 78.0–100.0)
Monocytes Absolute: 0.6 10*3/uL (ref 0.1–1.0)
Monocytes Relative: 6 % (ref 3–12)
Neutro Abs: 6.1 10*3/uL (ref 1.7–7.7)
Neutrophils Relative %: 68 % (ref 43–77)
Platelets: 221 10*3/uL (ref 150–400)
RBC: 4.41 MIL/uL (ref 3.87–5.11)
RDW: 13.4 % (ref 11.5–15.5)
WBC: 9 10*3/uL (ref 4.0–10.5)

## 2012-12-18 LAB — GLUCOSE, CAPILLARY: Glucose-Capillary: 247 mg/dL — ABNORMAL HIGH (ref 70–99)

## 2012-12-18 MED ORDER — OXYCODONE-ACETAMINOPHEN 5-325 MG PO TABS: 2.0000 | ORAL_TABLET | ORAL | Status: DC | PRN

## 2012-12-18 MED ORDER — SULFAMETHOXAZOLE-TRIMETHOPRIM 800-160 MG PO TABS: 1.0000 | ORAL_TABLET | Freq: Two times a day (BID) | ORAL | Status: DC

## 2012-12-18 NOTE — ED Notes (Signed)
Pt left dept without informing staff of leaving, EDP made aware

## 2012-12-18 NOTE — ED Notes (Signed)
Unable to have AMA form signed due to pt leaving without informing staff

## 2012-12-18 NOTE — ED Notes (Signed)
Pt noted a "knot on the nipple of her left breast", yesterday area is painful to touch, denies any fever or drainage.

## 2012-12-18 NOTE — ED Notes (Signed)
U/S called concerning results, was told that since it was a breast U/S that specialist had to read, was told that order was placed STAT, pt states that she "can't wait all day" and that she has to be in [place] at 1500 today, EDP informed of pt wanting to leave and of appointment
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
## 2012-12-18 NOTE — ED Provider Notes (Signed)
CSN: [id number]     Arrival date & time 12/18/12  0902 History  This chart was scribed for Hilario Quarry, MD, by Yevette Edwards, ED Scribe. This patient was seen in room APA12/APA12 and the patient's care was started at 9:13 AM.  First MD Initiated Contact with Patient 12/18/12 0912     Chief Complaint  Patient presents with  . Wound Check    The history is provided by the patient. No language interpreter was used.   HPI Comments: Sarah Charles is a 52 y.o. female who presents to the Emergency Department complaining of a knot present on her the nipple of her left breast which she first noticed last night.. She states that the knot is painful and that the pain prevents her from wearing a bra.  She denies any discharge from the nipple. She denies a history of prior symptoms. She also denies a h/o of abscesses and h/o of breast cancer or breast cancer in her family. The pt's last mammogram was at [facility] in November 2013.  She reports that a black spot on her breast was found in 1998, but that it a benign growth. The pt has a h/o of HTN and DM. The pt's blood sugar monitor is broken, and she has not been able to check her glucose levels. She reports that she does take her medications as prescribed. She denies using alcohol, but she does smoke cigarettes.   She lives with her boyfriend. She is currently unemployed.   Dr. Fulton Mole in [place] is PCP.   Past Medical History  Diagnosis Date  . Diabetes mellitus   . Arthritis   . Hypertension   . Hyperlipidemia    Past Surgical History  Procedure Laterality Date  . Cholecystectomy     No family history on file. History  Substance Use Topics  . Smoking status: Current Every Day Smoker    Types: Cigarettes  . Smokeless tobacco: Not on file  . Alcohol Use: No   No OB history provided.   Review of Systems  Constitutional: Negative for fever, chills and appetite change.  Skin:       Knot on left nipple.    All other  systems reviewed and are negative.    Allergies  Review of patient's allergies indicates no known allergies.  Home Medications   Current Outpatient Rx  Name  Route  Sig  Dispense  Refill  . amLODipine (NORVASC) 5 MG tablet   Oral   Take 5 mg by mouth daily.         . ARIPiprazole (ABILIFY) 10 MG tablet   Oral   Take 10 mg by mouth daily.         . cyclobenzaprine (FLEXERIL) 10 MG tablet   Oral   Take 1 tablet (10 mg total) by mouth 2 (two) times daily as needed for muscle spasms.   20 tablet   0   . ibuprofen (ADVIL,MOTRIN) 400 MG tablet   Oral   Take 1 tablet (400 mg total) by mouth every 6 (six) hours as needed for pain.   30 tablet   0   . metFORMIN (GLUCOPHAGE) 1000 MG tablet   Oral   Take 1,000 mg by mouth 2 (two) times daily with a meal.         . naproxen sodium (ANAPROX) 550 MG tablet   Oral   Take 550 mg by mouth 2 (two) times daily with a meal.         .  omeprazole (PRILOSEC) 40 MG capsule   Oral   Take 40 mg by mouth daily.         . saxagliptin HCl (ONGLYZA) 5 MG TABS tablet   Oral   Take 5 mg by mouth daily.         . simvastatin (ZOCOR) 20 MG tablet      Take 1 tablet daily for cholesterol control          . solifenacin (VESICARE) 5 MG tablet   Oral   Take 10 mg by mouth daily.         . valsartan-hydrochlorothiazide (DIOVAN-HCT) 320-12.5 MG per tablet   Oral   Take 1 tablet by mouth daily.          Triage Vitals: BP 164/102  Pulse 80  Temp(Src) 97.7 F (36.5 C) (Oral)  Resp 20  Ht 5\' 3"  (1.6 m)  Wt 245 lb (111.131 kg)  BMI 43.41 kg/m2  SpO2 99%  Physical Exam  Nursing note and vitals reviewed. Constitutional: She is oriented to person, place, and time. She appears well-developed and well-nourished. No distress.  HENT:  Head: Normocephalic and atraumatic.  Eyes: Conjunctivae and EOM are normal. Pupils are equal, round, and reactive to light.  Neck: Normal range of motion. Neck supple. No tracheal deviation  present.  Cardiovascular: Normal rate, regular rhythm and normal heart sounds.   Pulmonary/Chest: Effort normal and breath sounds normal. No respiratory distress.  Abdominal: Soft. There is no tenderness.  Musculoskeletal: Normal range of motion.  Neurological: She is alert and oriented to person, place, and time.  Skin: Skin is warm and dry.  Tender nodule on left nipple 3 X 3 cm.   Psychiatric: She has a normal mood and affect. Her behavior is normal.    ED Course   Medications - No data to display  DIAGNOSTIC STUDIES: Oxygen Saturation is 99% on room air, normal by my interpretation.    COORDINATION OF CARE:  9:20 AM-Discussed treatment plan with patient, and the patient agreed to the plan.   Procedures (including critical care time)  Labs Reviewed  BASIC METABOLIC PANEL - Abnormal; Notable for the following:    Glucose, Bld 264 (*)    Creatinine, Ser 0.42 (*)    All other components within normal limits  GLUCOSE, CAPILLARY - Abnormal; Notable for the following:    Glucose-Capillary 247 (*)    All other components within normal limits  CBC WITH DIFFERENTIAL   US Breast Left  12/18/2012   *RADIOLOGY REPORT*  Clinical Data:  52 year old female with left breast redness, pain and fever. Evaluate for abscess from the emergency room.  LEFT BREAST ULTRASOUND  Comparison:  None  Findings: Ultrasound is performed, showing a 1.6 x 1.2 x 1.5 cm heterogeneous hypoechoic structure in the left subareolar region most which demonstrates no internal vascular flow.  This probably represents an abscess.  Mild overlying skin thickening is noted.  IMPRESSION: 1.6 x 1.2 x 1.5 cm heterogeneous structure in the left subareolar breast - likely an abscess given clinical history and appearance. If drainage is not performed, short-term follow-up is recommended following antibiotic therapy.  BI-RADS CATEGORY 3:  Probably benign finding(s) - short interval follow-up suggested.  RECOMMENDATION: Left  diagnostic mammogram and left breast ultrasound follow-up in 2 weeks, following antibiotic therapy.  I have discussed the findings and recommendations with the patient. Results were also provided in writing at the conclusion of the visit.  If applicable, a reminder letter will be sent  to the patient regarding the next appointment.   Original Report Authenticated By: Harmon Pier, M.D.   No diagnosis found.  MDM  Awaiting [place] read. 12:09 PM  Patient left ama prior to [place] report.  Patient did not inform nurse or provider she was leaving.    I will attempt to contact patient.   Patient returned to ed.  Plan pain meds and antibiotics.  I am arranging surgical follow up. Dr. Lovell Sheehan will see patient in office tomorrow.   I personally performed the services described in this documentation, which was scribed in my presence. The recorded information has been reviewed and considered.   Hilario Quarry, MD 12/18/12 1434

## 2012-12-21 ENCOUNTER — Encounter (HOSPITAL_COMMUNITY): Payer: Self-pay | Admitting: Pharmacy Technician

## 2012-12-21 ENCOUNTER — Encounter (HOSPITAL_COMMUNITY)
Admission: RE | Admit: 2012-12-21 | Discharge: 2012-12-21 | Disposition: A | Discharge status: Home / Self Care | Payer: Medicaid Other | Source: Ambulatory Visit | Attending: General Surgery | Admitting: General Surgery

## 2012-12-21 ENCOUNTER — Encounter (HOSPITAL_COMMUNITY): Payer: Self-pay

## 2012-12-21 HISTORY — DX: Sleep apnea, unspecified: G47.30

## 2012-12-21 HISTORY — DX: Gastro-esophageal reflux disease without esophagitis: K21.9

## 2012-12-21 NOTE — H&P (Signed)
  NTS SOAP Note  Vital Signs:  Vitals as of: 12/19/2012: Systolic 173: Diastolic 81: Heart Rate 67: Temp 97.24F: Height 63ft 3in: Weight 210Lbs 0 Ounces: Pain Level 10: BMI 37.2  BMI : 37.2 kg/m2  Subjective: This 52 Years 2 Months old Female presents for of a left breast abscess.  Has had swelling behind left nipple two days ago.  No fever, chills.  U/S of left breast shows 1.8cm fluid collection, subareolar.  Seen in ER yesterday.  No drainage noted.  No family h/o breast cancer.  Review of Symptoms:  Constitutional:unremarkable   Head:unremarkable    Eyes:unremarkable   Nose/Mouth/Throat:unremarkable Cardiovascular:  unremarkable   Respiratory:unremarkable   Gastrointestin    abdominal pain,nausea Genitourinary:unremarkable       joint, back pain boils   as above Hematolgic/Lymphatic:unremarkable     Allergic/Immunologic:unremarkable     Past Medical History:    Reviewed   Past Medical History  Surgical History: cholecystectomy Medical Problems: high cholesterol, HTN, amlodipine, high cholesterol Psychiatric History:  Anxiety Allergies: nkda Medications: amlodipine, abilify, naproxen, vesicare, diovan, metformin, onglyza, omeprazole, simvastatin, ibuprofen, clonidine   Social History:Reviewed  Social History  Preferred Language: English Race:  White Ethnicity: Not Hispanic / Latino Age: 52 Years 2 Months Marital Status:  S Alcohol:  No Recreational drug(s):  No   Smoking Status: Current every day smoker reviewed on 12/19/2012 Started Date: 05/24/1976 Packs per day: 1.00 Functional Status reviewed on mm/dd/yyyy ------------------------------------------------ Bathing: Normal Cooking: Normal Dressing: Normal Driving: Normal Eating: Normal Managing Meds: Normal Oral Care: Normal Shopping: Normal Toileting: Normal Transferring: Normal Walking: Normal Cognitive Status reviewed on  mm/dd/yyyy ------------------------------------------------ Attention: Normal Decision Making: Normal Language: Normal Memory: Normal Motor: Normal Perception: Normal Problem Solving: Normal Visual and Spatial: Normal   Family History:  Reviewed  Family Health History Family History is Unknown    Objective Information: General:  Well appearing, well nourished in no distress. Heart:  RRR, no murmur Lungs:    CTA bilaterally, no wheezes, rhonchi, rales.  Breathing unlabored.     Tender swelling behind left nipple, no drainage noted.  No erythema noted.  No dominant mass in right breast.  Axillas negative for palpable palpable.  Assessment:Left breast abscess  Diagnosis &amp; Procedure Smart Code   Plan: Scheduled for Incision and drainage of left breast abscess on 12/22/12.  Risks and benefits of procedure including leaving the wound open were fully explained to the patient, who gives informed consent.

## 2012-12-21 NOTE — Patient Instructions (Addendum)
Sarah Charles  12/21/2012   Your procedure is scheduled on:  12/22/2012   Report to [facility] at  0730  AM.  Call this number if you have problems the morning of surgery: [phone]   Remember:   Do not eat food or drink liquids after midnight.   Take these medicines the morning of surgery with A SIP OF WATER:  Norvasc, flexaril, prilosec, oxycodone, vesicare, diovan   Do not wear jewelry, make-up or nail polish.  Do not wear lotions, powders, or perfumes.   Do not shave 48 hours prior to surgery. Men may shave face and neck.  Do not bring valuables to the hospital.  [facility] is not responsible  for any belongings or valuables.  Contacts, dentures or bridgework may not be worn into surgery.  Leave suitcase in the car. After surgery it may be brought to your room.  For patients admitted to the hospital, checkout time is 11:00 AM the day of discharge.   Patients discharged the day of surgery will not be allowed to drive home.  Name and phone number of your driver: family  Special Instructions: Shower using CHG 2 nights before surgery and the night before surgery.  If you shower the day of surgery use CHG.  Use special wash - you have one bottle of CHG for all showers.  You should use approximately 1/3 of the bottle for each shower.   Please read over the following fact sheets that you were given: Pain Booklet, Coughing and Deep Breathing, Surgical Site Infection Prevention, Anesthesia Post-op Instructions and Care and Recovery After Surgery Incision and Drainage Incision and drainage is a procedure in which a sac-like structure (cystic structure) is opened and drained. The area to be drained usually contains material such as pus, fluid, or blood.  LET YOUR CAREGIVER KNOW ABOUT:   Allergies to medicine.  Medicines taken, including vitamins, herbs, eyedrops, over-the-counter medicines, and creams.  Use of steroids (by mouth or creams).  Previous problems with anesthetics or  numbing medicines.  History of bleeding problems or blood clots.  Previous surgery.  Other health problems, including diabetes and kidney problems.  Possibility of pregnancy, if this applies. RISKS AND COMPLICATIONS  Pain.  Bleeding.  Scarring.  Infection. BEFORE THE PROCEDURE  You may need to have an ultrasound or other imaging tests to see how large or deep your cystic structure is. Blood tests may also be used to determine if you have an infection or how severe the infection is. You may need to have a tetanus shot. PROCEDURE  The affected area is cleaned with a cleaning fluid. The cyst area will then be numbed with a medicine (local anesthetic). A small incision will be made in the cystic structure. A syringe or catheter may be used to drain the contents of the cystic structure, or the contents may be squeezed out. The area will then be flushed with a cleansing solution. After cleansing the area, it is often gently packed with a gauze or another wound dressing. Once it is packed, it will be covered with gauze and tape or some other type of wound dressing. AFTER THE PROCEDURE   Often, you will be allowed to go home right after the procedure.  You may be given antibiotic medicine to prevent or heal an infection.  If the area was packed with gauze or some other wound dressing, you will likely need to come back in 1 to 2 days to get  it removed.  The area should heal in about 14 days. Document Released: 11/03/2000 Document Revised: 11/09/2011 Document Reviewed: 07/05/2011 [facility] Patient Information 2014 [place], [place]. PATIENT INSTRUCTIONS POST-ANESTHESIA  IMMEDIATELY FOLLOWING SURGERY:  Do not drive or operate machinery for the first twenty four hours after surgery.  Do not make any important decisions for twenty four hours after surgery or while taking narcotic pain medications or sedatives.  If you develop intractable nausea and vomiting or a severe headache please notify  your doctor immediately.  FOLLOW-UP:  Please make an appointment with your surgeon as instructed. You do not need to follow up with anesthesia unless specifically instructed to do so.  WOUND CARE INSTRUCTIONS (if applicable):  Keep a dry clean dressing on the anesthesia/puncture wound site if there is drainage.  Once the wound has quit draining you may leave it open to air.  Generally you should leave the bandage intact for twenty four hours unless there is drainage.  If the epidural site drains for more than 36-48 hours please call the anesthesia department.  QUESTIONS?:  Please feel free to call your physician or the hospital operator if you have any questions, and they will be happy to assist you.

## 2012-12-22 ENCOUNTER — Ambulatory Visit (HOSPITAL_COMMUNITY): Payer: Medicaid Other | Admitting: Anesthesiology

## 2012-12-22 ENCOUNTER — Ambulatory Visit (HOSPITAL_COMMUNITY)
Admission: RE | Admit: 2012-12-22 | Discharge: 2012-12-22 | Disposition: A | Discharge status: Home / Self Care | Payer: Medicaid Other | Source: Ambulatory Visit | Attending: General Surgery | Admitting: General Surgery

## 2012-12-22 ENCOUNTER — Encounter (HOSPITAL_COMMUNITY): Payer: Self-pay | Admitting: Anesthesiology

## 2012-12-22 ENCOUNTER — Encounter (HOSPITAL_COMMUNITY)
Admission: RE | Disposition: A | Discharge status: Home / Self Care | Payer: Self-pay | Source: Ambulatory Visit | Attending: General Surgery

## 2012-12-22 ENCOUNTER — Encounter (HOSPITAL_COMMUNITY): Payer: Self-pay | Admitting: *Deleted

## 2012-12-22 DIAGNOSIS — F17200 Nicotine dependence, unspecified, uncomplicated: Secondary | ICD-10-CM | POA: Insufficient documentation

## 2012-12-22 DIAGNOSIS — N61 Mastitis without abscess: Secondary | ICD-10-CM | POA: Insufficient documentation

## 2012-12-22 DIAGNOSIS — Z79899 Other long term (current) drug therapy: Secondary | ICD-10-CM | POA: Insufficient documentation

## 2012-12-22 DIAGNOSIS — F411 Generalized anxiety disorder: Secondary | ICD-10-CM | POA: Insufficient documentation

## 2012-12-22 DIAGNOSIS — E7800 Pure hypercholesterolemia, unspecified: Secondary | ICD-10-CM | POA: Insufficient documentation

## 2012-12-22 DIAGNOSIS — I10 Essential (primary) hypertension: Secondary | ICD-10-CM | POA: Insufficient documentation

## 2012-12-22 HISTORY — PX: INCISION AND DRAINAGE ABSCESS: SHX5864

## 2012-12-22 LAB — GLUCOSE, CAPILLARY
Glucose-Capillary: 162 mg/dL — ABNORMAL HIGH (ref 70–99)
Glucose-Capillary: 178 mg/dL — ABNORMAL HIGH (ref 70–99)

## 2012-12-22 SURGERY — INCISION AND DRAINAGE, ABSCESS
Anesthesia: General | Site: Breast | Laterality: Left | Wound class: Contaminated

## 2012-12-22 MED ORDER — ONDANSETRON HCL 4 MG/2ML IJ SOLN
INTRAMUSCULAR | Status: AC
  Filled 2012-12-22: qty 2

## 2012-12-22 MED ORDER — CLINDAMYCIN PHOSPHATE 900 MG/50ML IV SOLN: 900.0000 mg | Freq: Once | INTRAVENOUS | Status: DC

## 2012-12-22 MED ORDER — SODIUM CHLORIDE 0.9 % IR SOLN
Status: DC | PRN
  Administered 2012-12-22: 1000 mL

## 2012-12-22 MED ORDER — MIDAZOLAM HCL 5 MG/5ML IJ SOLN
INTRAMUSCULAR | Status: DC | PRN
  Administered 2012-12-22 (×2): 1 mg via INTRAVENOUS

## 2012-12-22 MED ORDER — ONDANSETRON HCL 4 MG/2ML IJ SOLN
4.0000 mg | Freq: Once | INTRAMUSCULAR | Status: AC
  Administered 2012-12-22: 4 mg via INTRAVENOUS

## 2012-12-22 MED ORDER — FENTANYL CITRATE 0.05 MG/ML IJ SOLN
25.0000 ug | INTRAMUSCULAR | Status: AC
  Administered 2012-12-22 (×2): 25 ug via INTRAVENOUS

## 2012-12-22 MED ORDER — FENTANYL CITRATE 0.05 MG/ML IJ SOLN
INTRAMUSCULAR | Status: DC | PRN
  Administered 2012-12-22: 25 ug via INTRAVENOUS

## 2012-12-22 MED ORDER — KETOROLAC TROMETHAMINE 30 MG/ML IJ SOLN
INTRAMUSCULAR | Status: AC
  Filled 2012-12-22: qty 1

## 2012-12-22 MED ORDER — BUPIVACAINE HCL (PF) 0.5 % IJ SOLN
INTRAMUSCULAR | Status: DC | PRN
  Administered 2012-12-22: 10 mL

## 2012-12-22 MED ORDER — MIDAZOLAM HCL 2 MG/2ML IJ SOLN
INTRAMUSCULAR | Status: AC
  Filled 2012-12-22: qty 2

## 2012-12-22 MED ORDER — KETOROLAC TROMETHAMINE 30 MG/ML IJ SOLN
30.0000 mg | Freq: Once | INTRAMUSCULAR | Status: AC
  Administered 2012-12-22: 30 mg via INTRAVENOUS

## 2012-12-22 MED ORDER — PROPOFOL 10 MG/ML IV BOLUS
INTRAVENOUS | Status: DC | PRN
  Administered 2012-12-22: 120 mg via INTRAVENOUS

## 2012-12-22 MED ORDER — FENTANYL CITRATE 0.05 MG/ML IJ SOLN
INTRAMUSCULAR | Status: AC
  Filled 2012-12-22: qty 2

## 2012-12-22 MED ORDER — CLINDAMYCIN PHOSPHATE 900 MG/50ML IV SOLN
INTRAVENOUS | Status: AC
  Filled 2012-12-22: qty 50

## 2012-12-22 MED ORDER — LIDOCAINE HCL 1 % IJ SOLN
INTRAMUSCULAR | Status: DC | PRN
  Administered 2012-12-22: 50 mg via INTRADERMAL

## 2012-12-22 MED ORDER — ONDANSETRON HCL 4 MG/2ML IJ SOLN: 4.0000 mg | Freq: Once | INTRAMUSCULAR | Status: DC | PRN

## 2012-12-22 MED ORDER — FENTANYL CITRATE 0.05 MG/ML IJ SOLN: 25.0000 ug | INTRAMUSCULAR | Status: DC | PRN

## 2012-12-22 MED ORDER — LACTATED RINGERS IV SOLN
INTRAVENOUS | Status: DC
  Administered 2012-12-22: 08:00:00 via INTRAVENOUS

## 2012-12-22 MED ORDER — CHLORHEXIDINE GLUCONATE 4 % EX LIQD: 1.0000 "application " | Freq: Once | CUTANEOUS | Status: DC

## 2012-12-22 MED ORDER — MIDAZOLAM HCL 2 MG/2ML IJ SOLN
1.0000 mg | INTRAMUSCULAR | Status: DC | PRN
  Administered 2012-12-22 (×2): 2 mg via INTRAVENOUS

## 2012-12-22 SURGICAL SUPPLY — 30 items
BAG HAMPER (MISCELLANEOUS) ×2 IMPLANT
CLOTH BEACON ORANGE TIMEOUT ST (SAFETY) ×2 IMPLANT
COVER LIGHT HANDLE STERIS (MISCELLANEOUS) ×4 IMPLANT
DECANTER SPIKE VIAL GLASS SM (MISCELLANEOUS) ×2 IMPLANT
ELECT REM PT RETURN 9FT ADLT (ELECTROSURGICAL) ×2
ELECTRODE REM PT RTRN 9FT ADLT (ELECTROSURGICAL) ×1 IMPLANT
GLOVE BIOGEL M STRL SZ7.5 (GLOVE) ×2 IMPLANT
GLOVE BIOGEL PI IND STRL 7.5 (GLOVE) ×1 IMPLANT
GLOVE BIOGEL PI INDICATOR 7.5 (GLOVE) ×1
GLOVE ECLIPSE 7.0 STRL STRAW (GLOVE) ×2 IMPLANT
GLOVE EXAM NITRILE LRG STRL (GLOVE) ×2 IMPLANT
GOWN STRL REIN XL XLG (GOWN DISPOSABLE) ×4 IMPLANT
KIT ROOM TURNOVER APOR (KITS) ×2 IMPLANT
MANIFOLD NEPTUNE II (INSTRUMENTS) ×2 IMPLANT
MARKER SKIN DUAL TIP RULER LAB (MISCELLANEOUS) ×2 IMPLANT
NEEDLE HYPO 21X1.5 SAFETY (NEEDLE) ×2 IMPLANT
NS IRRIG 1000ML POUR BTL (IV SOLUTION) ×2 IMPLANT
PACK MINOR (CUSTOM PROCEDURE TRAY) ×2 IMPLANT
PAD ARMBOARD 7.5X6 YLW CONV (MISCELLANEOUS) ×2 IMPLANT
SET BASIN LINEN APH (SET/KITS/TRAYS/PACK) ×2 IMPLANT
SPONGE GAUZE 4X4 12PLY (GAUZE/BANDAGES/DRESSINGS) ×2 IMPLANT
SUT PROLENE 4 0 PS 2 18 (SUTURE) ×4 IMPLANT
SUT VIC AB 3-0 SH 27 (SUTURE) ×1
SUT VIC AB 3-0 SH 27X BRD (SUTURE) ×1 IMPLANT
SUT VIC AB 4-0 PS2 27 (SUTURE) ×2 IMPLANT
SWAB CULTURE LIQ STUART DBL (MISCELLANEOUS) ×2 IMPLANT
SYR BULB IRRIGATION 50ML (SYRINGE) ×2 IMPLANT
SYR CONTROL 10ML LL (SYRINGE) ×2 IMPLANT
TAPE MEDIFIX FOAM 3 (GAUZE/BANDAGES/DRESSINGS) ×2 IMPLANT
TUBE ANAEROBIC PORT A CUL  W/M (MISCELLANEOUS) ×2 IMPLANT

## 2012-12-22 NOTE — Op Note (Signed)
Patient:  MAKELL DROHAN  DOB:  12/16/1960  MRN:  [id number]   Preop Diagnosis:  Left breast abscess  Postop Diagnosis:  Same  Procedure:  Incision and drainage of left breast abscess  Surgeon:  Franky Macho, M.D.  Anes:  General  Indications:  Patient is a 52 year old white female who presents with a left breast abscess in the retroareolar area. The risks and benefits of the procedure were fully explained to the patient, gave informed consent.  Procedure note:  The patient was placed the supine position. After general anesthesia was administered, the left breast was prepped and draped using usual sterile technique with Betadine. Surgical site confirmation was performed.  A curvilinear incision was made along the inferior aspect of the areola. The abscess cavity was in the retroareolar area. It appeared milky in nature. Aerobic and anaerobic cultures were taken and sent to microbiology. The abscess cavity was drained and the necrotic tissue debrided. The wound was irrigated normal saline. 0.5% Sensorcaine was instilled in the surrounding region. Iodoform new gauze was then placed into the wound. The incision was closed loosely using 4-0 Prolene interrupted sutures. A dry sterile dressing was then applied.  All tape and needle counts were correct at the end of the procedure. Patient was awakened and transferred to PACU in stable condition.  Complications:  None  EBL:  Minimal  Specimen:  Cultures of left breast abscess

## 2012-12-22 NOTE — Anesthesia Procedure Notes (Signed)
Procedure Name: LMA Insertion Date/Time: 12/22/2012 9:29 AM Performed by: Despina Hidden Pre-anesthesia Checklist: Emergency Drugs available, Patient identified, Suction available and Patient being monitored Patient Re-evaluated:Patient Re-evaluated prior to inductionOxygen Delivery Method: Circle system utilized Preoxygenation: Pre-oxygenation with 100% oxygen Intubation Type: IV induction Ventilation: Mask ventilation without difficulty LMA: LMA inserted LMA Size: 3.0 Tube type: Oral Number of attempts: 1 Placement Confirmation: positive ETCO2 and breath sounds checked- equal and bilateral Tube secured with: Tape Dental Injury: Teeth and Oropharynx as per pre-operative assessment

## 2012-12-22 NOTE — Interval H&P Note (Signed)
History and Physical Interval Note:  12/22/2012 8:46 AM  Sarah Charles  has presented today for surgery, with the diagnosis of left breast abscess  The various methods of treatment have been discussed with the patient and family. After consideration of risks, benefits and other options for treatment, the patient has consented to  Procedure(s): INCISION AND DRAINAGE ABSCESS (Left) as a surgical intervention .  The patient's history has been reviewed, patient examined, no change in status, stable for surgery.  I have reviewed the patient's chart and labs.  Questions were answered to the patient's satisfaction.     Franky Macho A

## 2012-12-22 NOTE — Anesthesia Postprocedure Evaluation (Signed)
  Anesthesia Post-op Note  Patient: Sarah Charles  Procedure(s) Performed: Procedure(s): INCISION AND DRAINAGE ABSCESS (Left)  Patient Location: PACU  Anesthesia Type:General  Level of Consciousness: awake, alert , oriented and patient cooperative  Airway and Oxygen Therapy: Patient Spontanous Breathing and Patient connected to face mask oxygen  Post-op Pain: 2 /10, mild  Post-op Assessment: Post-op Vital signs reviewed, Patient's Cardiovascular Status Stable, Respiratory Function Stable, Patent Airway and Pain level controlled  Post-op Vital Signs: Reviewed and stable  Complications: No apparent anesthesia complications

## 2012-12-22 NOTE — Transfer of Care (Signed)
Immediate Anesthesia Transfer of Care Note  Patient: Sarah Charles  Procedure(s) Performed: Procedure(s): INCISION AND DRAINAGE ABSCESS (Left)  Patient Location: PACU  Anesthesia Type:General  Level of Consciousness: awake and patient cooperative  Airway & Oxygen Therapy: Patient Spontanous Breathing and Patient connected to face mask oxygen  Post-op Assessment: Report given to PACU RN, Post -op Vital signs reviewed and stable and Patient moving all extremities  Post vital signs: Reviewed and stable  Complications: No apparent anesthesia complications

## 2012-12-22 NOTE — Anesthesia Preprocedure Evaluation (Signed)
Anesthesia Evaluation  Patient identified by MRN, date of birth, ID band Patient awake    Reviewed: Allergy & Precautions, H&P , NPO status , Patient's Chart, lab work & pertinent test results  Airway Mallampati: II TM Distance: >3 FB     Dental  (+) Edentulous Upper and Edentulous Lower   Pulmonary sleep apnea ,  breath sounds clear to auscultation        Cardiovascular hypertension, Pt. on medications Rhythm:Regular Rate:Normal     Neuro/Psych PSYCHIATRIC DISORDERS Anxiety Depression    GI/Hepatic GERD- (no reflux now)  Medicated and Controlled,  Endo/Other  diabetes, Type 2, Oral Hypoglycemic Agents  Renal/GU      Musculoskeletal   Abdominal   Peds  Hematology   Anesthesia Other Findings   Reproductive/Obstetrics                           Anesthesia Physical Anesthesia Plan  ASA: III  Anesthesia Plan: General   Post-op Pain Management:    Induction: Intravenous  Airway Management Planned: LMA  Additional Equipment:   Intra-op Plan:   Post-operative Plan: Extubation in OR  Informed Consent: I have reviewed the patients History and Physical, chart, labs and discussed the procedure including the risks, benefits and alternatives for the proposed anesthesia with the patient or authorized representative who has indicated his/her understanding and acceptance.     Plan Discussed with:   Anesthesia Plan Comments:         Anesthesia Quick Evaluation

## 2012-12-23 ENCOUNTER — Encounter (HOSPITAL_COMMUNITY): Payer: Self-pay | Admitting: Emergency Medicine

## 2012-12-23 ENCOUNTER — Emergency Department (HOSPITAL_COMMUNITY)
Admission: EM | Admit: 2012-12-23 | Discharge: 2012-12-23 | Disposition: A | Discharge status: Home / Self Care | Payer: Medicaid Other | Attending: Emergency Medicine | Admitting: Emergency Medicine

## 2012-12-23 DIAGNOSIS — E785 Hyperlipidemia, unspecified: Secondary | ICD-10-CM | POA: Insufficient documentation

## 2012-12-23 DIAGNOSIS — Z791 Long term (current) use of non-steroidal anti-inflammatories (NSAID): Secondary | ICD-10-CM | POA: Insufficient documentation

## 2012-12-23 DIAGNOSIS — M129 Arthropathy, unspecified: Secondary | ICD-10-CM | POA: Insufficient documentation

## 2012-12-23 DIAGNOSIS — K219 Gastro-esophageal reflux disease without esophagitis: Secondary | ICD-10-CM | POA: Insufficient documentation

## 2012-12-23 DIAGNOSIS — I10 Essential (primary) hypertension: Secondary | ICD-10-CM | POA: Insufficient documentation

## 2012-12-23 DIAGNOSIS — Z79899 Other long term (current) drug therapy: Secondary | ICD-10-CM | POA: Insufficient documentation

## 2012-12-23 DIAGNOSIS — Y838 Other surgical procedures as the cause of abnormal reaction of the patient, or of later complication, without mention of misadventure at the time of the procedure: Secondary | ICD-10-CM | POA: Insufficient documentation

## 2012-12-23 DIAGNOSIS — IMO0002 Reserved for concepts with insufficient information to code with codable children: Secondary | ICD-10-CM | POA: Insufficient documentation

## 2012-12-23 DIAGNOSIS — F411 Generalized anxiety disorder: Secondary | ICD-10-CM | POA: Insufficient documentation

## 2012-12-23 DIAGNOSIS — F17200 Nicotine dependence, unspecified, uncomplicated: Secondary | ICD-10-CM | POA: Insufficient documentation

## 2012-12-23 DIAGNOSIS — Z9889 Other specified postprocedural states: Secondary | ICD-10-CM | POA: Insufficient documentation

## 2012-12-23 DIAGNOSIS — G4730 Sleep apnea, unspecified: Secondary | ICD-10-CM | POA: Insufficient documentation

## 2012-12-23 DIAGNOSIS — E119 Type 2 diabetes mellitus without complications: Secondary | ICD-10-CM | POA: Insufficient documentation

## 2012-12-23 DIAGNOSIS — Z5189 Encounter for other specified aftercare: Secondary | ICD-10-CM

## 2012-12-23 NOTE — ED Provider Notes (Signed)
Medical screening examination/treatment/procedure(s) were performed by non-physician practitioner and as supervising physician I was immediately available for consultation/collaboration.   Hurman Horn, MD 12/23/12 [phone]

## 2012-12-23 NOTE — ED Provider Notes (Signed)
CSN: [id number]     Arrival date & time 12/23/12  0753 History     First MD Initiated Contact with Patient 12/23/12 [phone]     Chief Complaint  Patient presents with  . Drainage from Incision   (Consider location/radiation/quality/duration/timing/severity/associated sxs/prior Treatment) HPI Comments: Pt has  And abscess drained in  OR on yesterday. This early AM pt noted the dressing saturated with blood and some bleeding noted after she removed the dressing. No fever reported. Pt is not sure if sutures came apart or not.  No reported fever. She is not sure of any red streaking. No significant change in the pain. Pt denies being on any platelet altering drugs.  The history is provided by the patient.    Past Medical History  Diagnosis Date  . Diabetes mellitus   . Arthritis   . Hypertension   . Hyperlipidemia   . GERD (gastroesophageal reflux disease)   . Sleep apnea     pt sts does not use "cause i dont think I need it anymore".   Past Surgical History  Procedure Laterality Date  . Cholecystectomy     No family history on file. History  Substance Use Topics  . Smoking status: Current Every Day Smoker -- 0.50 packs/day for 40 years    Types: Cigarettes  . Smokeless tobacco: Not on file  . Alcohol Use: No   OB History   Grav Para Term Preterm Abortions TAB SAB Ect Mult Living                 Review of Systems  Constitutional: Negative for activity change.       All ROS Neg except as noted in HPI  HENT: Negative for nosebleeds and neck pain.   Eyes: Negative for photophobia and discharge.  Respiratory: Negative for cough, shortness of breath and wheezing.   Cardiovascular: Negative for chest pain and palpitations.  Gastrointestinal: Negative for abdominal pain and blood in stool.  Genitourinary: Negative for dysuria, frequency and hematuria.  Musculoskeletal: Negative for back pain and arthralgias.  Skin: Negative.   Neurological: Negative for dizziness, seizures and  speech difficulty.  Psychiatric/Behavioral: Negative for hallucinations and confusion.    Allergies  Review of patient's allergies indicates no known allergies.  Home Medications   Current Outpatient Rx  Name  Route  Sig  Dispense  Refill  . amLODipine (NORVASC) 5 MG tablet   Oral   Take 5 mg by mouth daily.         . cloNIDine (CATAPRES) 0.1 MG tablet   Oral   Take 0.1 mg by mouth 2 (two) times daily.         . cyclobenzaprine (FLEXERIL) 10 MG tablet   Oral   Take 1 tablet (10 mg total) by mouth 2 (two) times daily as needed for muscle spasms.   20 tablet   0   . ibuprofen (ADVIL,MOTRIN) 800 MG tablet   Oral   Take 800 mg by mouth 3 (three) times daily as needed for pain.         . metFORMIN (GLUCOPHAGE) 1000 MG tablet   Oral   Take 1,000 mg by mouth 2 (two) times daily with a meal.         . omeprazole (PRILOSEC) 40 MG capsule   Oral   Take 40 mg by mouth daily.         . saxagliptin HCl (ONGLYZA) 5 MG TABS tablet   Oral   Take 5 mg  by mouth daily.         . simvastatin (ZOCOR) 20 MG tablet      Take 1 tablet daily for cholesterol control          . solifenacin (VESICARE) 5 MG tablet   Oral   Take 5 mg by mouth daily.          Marland Kitchen sulfamethoxazole-trimethoprim (SEPTRA DS) 800-160 MG per tablet   Oral   Take 1 tablet by mouth every 12 (twelve) hours.   10 tablet   0   . valsartan-hydrochlorothiazide (DIOVAN-HCT) 320-12.5 MG per tablet   Oral   Take 1 tablet by mouth daily.         Marland Kitchen oxyCODONE-acetaminophen (PERCOCET/ROXICET) 5-325 MG per tablet   Oral   Take 2 tablets by mouth every 4 (four) hours as needed for pain.   15 tablet   0    BP 139/76  Pulse 86  Temp(Src) 98.3 F (36.8 C)  Resp 20  Ht 5\' 3"  (1.6 m)  Wt 210 lb (95.255 kg)  BMI 37.21 kg/m2  SpO2 99% Physical Exam  Nursing note and vitals reviewed. Constitutional: She is oriented to person, place, and time. She appears well-developed and well-nourished.  Non-toxic  appearance.  HENT:  Head: Normocephalic.  Right Ear: Tympanic membrane and external ear normal.  Left Ear: Tympanic membrane and external ear normal.  Eyes: EOM and lids are normal. Pupils are equal, round, and reactive to light.  Neck: Normal range of motion. Neck supple. Carotid bruit is not present.  Cardiovascular: Normal rate, regular rhythm, normal heart sounds, intact distal pulses and normal pulses.   Pulmonary/Chest: Breath sounds normal. No respiratory distress.  Pt has 5 stitches in place just under the  Left nipple area. Minimal drainage at this time. No drainage form the nipple. No red streaking. No palpable abscess noted. Chaperone present during the exam.  Abdominal: Soft. Bowel sounds are normal. There is no tenderness. There is no guarding.  Musculoskeletal: Normal range of motion.  Lymphadenopathy:       Head (right side): No submandibular adenopathy present.       Head (left side): No submandibular adenopathy present.    She has no cervical adenopathy.  Neurological: She is alert and oriented to person, place, and time. She has normal strength. No cranial nerve deficit or sensory deficit.  Skin: Skin is warm and dry.  Psychiatric: Her speech is normal. Her mood appears anxious.    ED Course   Procedures (including critical care time)  Labs Reviewed - No data to display No results found. No diagnosis found.  MDM  *I have reviewed nursing notes, vital signs, and all appropriate lab and imaging results for this patient.** Pt has I and D of abscess of the left breast yesterday. Pt noted bleeding this AM. No significant bleeding at this time. No evidence for infection. Dressing applied. Pt to follow up with Dr Lovell Sheehan in the office.  Kathie Dike, PA-C 12/25/12 [phone]

## 2012-12-23 NOTE — ED Notes (Signed)
Pt c/o drainage from I & D site on left breast. I & D done yesterday.

## 2012-12-23 NOTE — ED Notes (Signed)
Pt had I&D performed yesterday, states that she woke up this am with the area bleeding, bandage removed with small amount of blood noted to bandage, no bleeding noted at incision site. New bandage applied.

## 2012-12-23 NOTE — ED Notes (Signed)
New bandage applied, telfa, 4X4's, tape, pt tolerated well,

## 2012-12-25 ENCOUNTER — Encounter (HOSPITAL_COMMUNITY): Payer: Self-pay | Admitting: General Surgery

## 2012-12-25 LAB — CULTURE, ROUTINE-ABSCESS

## 2012-12-27 LAB — ANAEROBIC CULTURE

## 2012-12-27 NOTE — ED Provider Notes (Signed)
Medical screening examination/treatment/procedure(s) were performed by non-physician practitioner and as supervising physician I was immediately available for consultation/collaboration.   Hurman Horn, MD 12/27/12 [phone]

## 2013-01-02 ENCOUNTER — Other Ambulatory Visit: Payer: Self-pay | Admitting: Family Medicine

## 2013-01-02 DIAGNOSIS — N611 Abscess of the breast and nipple: Secondary | ICD-10-CM

## 2013-01-16 ENCOUNTER — Ambulatory Visit
Admission: RE | Admit: 2013-01-16 | Discharge: 2013-01-16 | Disposition: A | Discharge status: Home / Self Care | Payer: Medicaid Other | Source: Ambulatory Visit | Attending: Family Medicine | Admitting: Family Medicine

## 2013-01-16 DIAGNOSIS — N611 Abscess of the breast and nipple: Secondary | ICD-10-CM

## 2013-02-04 ENCOUNTER — Emergency Department (HOSPITAL_COMMUNITY)
Admission: EM | Admit: 2013-02-04 | Discharge: 2013-02-04 | Disposition: A | Discharge status: Home / Self Care | Payer: Medicaid Other | Attending: Emergency Medicine | Admitting: Emergency Medicine

## 2013-02-04 ENCOUNTER — Encounter (HOSPITAL_COMMUNITY): Payer: Self-pay | Admitting: *Deleted

## 2013-02-04 DIAGNOSIS — M129 Arthropathy, unspecified: Secondary | ICD-10-CM | POA: Insufficient documentation

## 2013-02-04 DIAGNOSIS — F17200 Nicotine dependence, unspecified, uncomplicated: Secondary | ICD-10-CM | POA: Insufficient documentation

## 2013-02-04 DIAGNOSIS — M545 Low back pain, unspecified: Secondary | ICD-10-CM | POA: Insufficient documentation

## 2013-02-04 DIAGNOSIS — K219 Gastro-esophageal reflux disease without esophagitis: Secondary | ICD-10-CM | POA: Insufficient documentation

## 2013-02-04 DIAGNOSIS — I10 Essential (primary) hypertension: Secondary | ICD-10-CM | POA: Insufficient documentation

## 2013-02-04 DIAGNOSIS — Z79899 Other long term (current) drug therapy: Secondary | ICD-10-CM | POA: Insufficient documentation

## 2013-02-04 DIAGNOSIS — M549 Dorsalgia, unspecified: Secondary | ICD-10-CM

## 2013-02-04 DIAGNOSIS — E119 Type 2 diabetes mellitus without complications: Secondary | ICD-10-CM

## 2013-02-04 DIAGNOSIS — E785 Hyperlipidemia, unspecified: Secondary | ICD-10-CM | POA: Insufficient documentation

## 2013-02-04 MED ORDER — HYDROCODONE-ACETAMINOPHEN 5-325 MG PO TABS
2.0000 | ORAL_TABLET | Freq: Once | ORAL | Status: AC
  Administered 2013-02-04: 2 via ORAL
  Filled 2013-02-04: qty 2

## 2013-02-04 MED ORDER — IBUPROFEN 800 MG PO TABS: 800.0000 mg | ORAL_TABLET | Freq: Three times a day (TID) | ORAL | Status: DC

## 2013-02-04 MED ORDER — METHOCARBAMOL 500 MG PO TABS: 500.0000 mg | ORAL_TABLET | Freq: Two times a day (BID) | ORAL | Status: DC

## 2013-02-04 MED ORDER — METHOCARBAMOL 500 MG PO TABS
500.0000 mg | ORAL_TABLET | Freq: Once | ORAL | Status: AC
  Administered 2013-02-04: 500 mg via ORAL
  Filled 2013-02-04: qty 1

## 2013-02-04 MED ORDER — TRAMADOL HCL 50 MG PO TABS: 50.0000 mg | ORAL_TABLET | Freq: Four times a day (QID) | ORAL | Status: DC | PRN

## 2013-02-04 MED ORDER — FREESTYLE SYSTEM KIT: 1.0000 | PACK | Status: DC | PRN

## 2013-02-04 NOTE — ED Provider Notes (Signed)
CSN: [id number]     Arrival date & time 02/04/13  2036 History   First MD Initiated Contact with Patient 02/04/13 2051     Chief Complaint  Patient presents with  . Back Pain   (Consider location/radiation/quality/duration/timing/severity/associated sxs/prior Treatment) Patient is a 52 y.o. female presenting with back pain. The history is provided by the patient, the spouse and medical records. No language interpreter was used.  Back Pain Associated symptoms: no abdominal pain, no chest pain, no dysuria, no fever, no headaches, no numbness and no weakness     Sarah Charles is a 52 y.o. female  with a hx of arthritis, hypertension, diabetes, GERD, stress incontinence presents to the Emergency Department complaining of gradual, persistent, progressively worsening low back pain onset Friday evening after working her for shift at the [employer].  Patient states she return to work this weekend after not having worked for more than 5 years. She reports she lifted heavy trays on Friday and "worked for [employer]" for the last 2 days. She states each day her back pain has worsened.   Associated symptoms include radiation into the posterior thighs of both legs.  Patient reports she has tried taking Flexeril without relief. She states she no longer has any ibuprofen and did not try that. Nothing makes it better and walking up stairs, palpation, bending, lifting makes it worse.  Pt denies fever, chills, headache, neck pain, chest pain, shortness of breath, abdominal pain, nausea, vomiting, diarrhea, frequency, urgency, polyuria, polydipsia, saddle anesthesia, loss of bowel or bladder control, gait disturbance.  Patient reports her back pain feels the same as usual but with greater intensity.  She also reports that she has not been checking her blood sugar because her meter is broken but that she has been taking her medications every day.  Patient also reports she has a followup with her primary care physician on  September 29.  Past Medical History  Diagnosis Date  . Diabetes mellitus   . Arthritis   . Hypertension   . Hyperlipidemia   . GERD (gastroesophageal reflux disease)   . Sleep apnea     pt sts does not use "cause i dont think I need it anymore".   Past Surgical History  Procedure Laterality Date  . Cholecystectomy    . Incision and drainage abscess Left 12/22/2012    Procedure: INCISION AND DRAINAGE ABSCESS;  Surgeon: Dalia Heading, MD;  Location: [facility];  Service: General;  Laterality: Left;   No family history on file. History  Substance Use Topics  . Smoking status: Current Every Day Smoker -- 0.50 packs/day for 40 years    Types: Cigarettes  . Smokeless tobacco: Not on file  . Alcohol Use: No   OB History   Grav Para Term Preterm Abortions TAB SAB Ect Mult Living                 Review of Systems  Constitutional: Negative for fever and fatigue.  HENT: Negative for neck pain and neck stiffness.   Respiratory: Negative for chest tightness and shortness of breath.   Cardiovascular: Negative for chest pain.  Gastrointestinal: Negative for nausea, vomiting, abdominal pain and diarrhea.  Endocrine: Negative for polydipsia, polyphagia and polyuria.  Genitourinary: Negative for dysuria, urgency, frequency and hematuria.  Musculoskeletal: Positive for back pain. Negative for joint swelling and gait problem.  Skin: Negative for rash.  Neurological: Negative for weakness, light-headedness, numbness and headaches.  All other systems reviewed and are negative.  Allergies  Review of patient's allergies indicates no known allergies.  Home Medications   Current Outpatient Rx  Name  Route  Sig  Dispense  Refill  . amLODipine (NORVASC) 5 MG tablet   Oral   Take 5 mg by mouth daily.         . cloNIDine (CATAPRES) 0.1 MG tablet   Oral   Take 0.1 mg by mouth 2 (two) times daily.         Marland Kitchen ibuprofen (ADVIL,MOTRIN) 800 MG tablet   Oral   Take 800 mg by mouth 3 (three)  times daily as needed for pain.         . metFORMIN (GLUCOPHAGE) 1000 MG tablet   Oral   Take 1,000 mg by mouth 2 (two) times daily with a meal.         . omeprazole (PRILOSEC) 40 MG capsule   Oral   Take 40 mg by mouth daily.         . saxagliptin HCl (ONGLYZA) 5 MG TABS tablet   Oral   Take 5 mg by mouth daily.         . simvastatin (ZOCOR) 20 MG tablet      Take 1 tablet daily for cholesterol control          . solifenacin (VESICARE) 5 MG tablet   Oral   Take 5 mg by mouth daily.          . valsartan-hydrochlorothiazide (DIOVAN-HCT) 320-12.5 MG per tablet   Oral   Take 1 tablet by mouth daily.         Marland Kitchen glucose monitoring kit (FREESTYLE) monitoring kit   Does not apply   1 each by Does not apply route as needed for other.   1 each   0   . ibuprofen (ADVIL,MOTRIN) 800 MG tablet   Oral   Take 1 tablet (800 mg total) by mouth 3 (three) times daily.   21 tablet   0   . methocarbamol (ROBAXIN) 500 MG tablet   Oral   Take 1 tablet (500 mg total) by mouth 2 (two) times daily.   20 tablet   0   . traMADol (ULTRAM) 50 MG tablet   Oral   Take 1 tablet (50 mg total) by mouth every 6 (six) hours as needed for pain.   15 tablet   0    BP 146/100  Pulse 96  Temp(Src) 98.6 F (37 C) (Oral)  Resp 18  SpO2 99% Physical Exam  Nursing note and vitals reviewed. Constitutional: She is oriented to person, place, and time. She appears well-developed and well-nourished. No distress.  HENT:  Head: Normocephalic and atraumatic.  Mouth/Throat: Oropharynx is clear and moist. No oropharyngeal exudate.  Eyes: Conjunctivae are normal. Pupils are equal, round, and reactive to light.  Neck: Normal range of motion. Neck supple.  Full ROM without pain  Cardiovascular: Normal rate, regular rhythm, normal heart sounds and intact distal pulses.   No murmur heard. Pulmonary/Chest: Effort normal and breath sounds normal. No respiratory distress. She has no wheezes.   Abdominal: Soft. Bowel sounds are normal. She exhibits no distension. There is no tenderness.  Musculoskeletal:  Mildly decreased range of motion of the T-spine and L-spine secondary to pain No tenderness to palpation of the spinous processes of the T-spine or L-spine Mild tenderness to palpation of the paraspinous muscles of the L-spine  Lymphadenopathy:    She has no cervical adenopathy.  Neurological: She is alert  and oriented to person, place, and time. She has normal reflexes. She exhibits normal muscle tone. Coordination normal.  Speech is clear and goal oriented, follows commands Normal strength in upper and lower extremities bilaterally including dorsiflexion and plantar flexion, strong and equal grip strength Sensation normal to light and [place] touch Moves extremities without ataxia, coordination intact Normal gait Normal balance  Skin: Skin is warm and dry. No rash noted. She is not diaphoretic. No erythema.  Psychiatric: She has a normal mood and affect. Her behavior is normal.    ED Course  Procedures (including critical care time) Labs Review Labs Reviewed - No data to display Imaging Review No results found.  MDM   1. Back pain   2. Diabetes mellitus      Sarah Charles presents with chronic back pain aggravated by increased lifting and twisting.  Patient with back pain.  No neurological deficits and normal neuro exam.  Patient can walk but states is painful.  Patient reports long-standing history of stress incontinence but no acute loss of bowel or bladder control.  No concern for cauda equina.  No fever, night sweats, weight loss, h/o cancer, IVDU.  RICE protocol and pain medicine indicated and discussed with patient.  Patient also requests glucose meter. Glucose checked here and found to be 366. Patient reports that she does not remember when she checked her blood sugar last as her glucose meter has been broken for a while. She denies polydipsia, polyuria,  polyphagia, abdominal pain, nausea, vomiting, diarrhea.  Patient without ketones on breath.  Patient reports she takes her diabetes medications everyday as directed without fail.  Will write for a glucose meter today. Recommend further followup with primary care physician on September 29.  It has been determined that no acute conditions requiring further emergency intervention are present at this time. The patient/guardian have been advised of the diagnosis and plan. We have discussed signs and symptoms that warrant return to the ED, such as changes or worsening in symptoms.   Vital signs are stable at discharge.   BP 146/100  Pulse 96  Temp(Src) 98.6 F (37 C) (Oral)  Resp 18  SpO2 99%  Patient/guardian has voiced understanding and agreed to follow-up with the PCP or specialist.         Dierdre Forth, PA-C 02/04/13 2141

## 2013-02-04 NOTE — ED Notes (Signed)
Pt has history of arthritis, started work Friday at [employer] pain worse in lower back and legs.

## 2013-02-04 NOTE — ED Provider Notes (Signed)
Medical screening examination/treatment/procedure(s) were performed by non-physician practitioner and as supervising physician I was immediately available for consultation/collaboration.   Gwyneth Sprout, MD 02/04/13 [phone]

## 2013-02-05 LAB — GLUCOSE, CAPILLARY: Glucose-Capillary: 365 mg/dL — ABNORMAL HIGH (ref 70–99)

## 2013-02-11 ENCOUNTER — Emergency Department (HOSPITAL_COMMUNITY)
Admission: EM | Admit: 2013-02-11 | Discharge: 2013-02-12 | Discharge status: Against Medical Advice | Payer: Medicaid Other | Attending: Emergency Medicine | Admitting: Emergency Medicine

## 2013-02-11 ENCOUNTER — Encounter (HOSPITAL_COMMUNITY): Payer: Self-pay | Admitting: Emergency Medicine

## 2013-02-11 DIAGNOSIS — M549 Dorsalgia, unspecified: Secondary | ICD-10-CM | POA: Insufficient documentation

## 2013-02-11 DIAGNOSIS — R300 Dysuria: Secondary | ICD-10-CM | POA: Insufficient documentation

## 2013-02-11 LAB — URINALYSIS, ROUTINE W REFLEX MICROSCOPIC
Bilirubin Urine: NEGATIVE
Glucose, UA: >1000 mg/dL — AB
Ketones, ur: NEGATIVE mg/dL
Nitrite: POSITIVE — AB
Protein, ur: NEGATIVE mg/dL
Specific Gravity, Urine: 1.026 (ref 1.005–1.030)
Urobilinogen, UA: 0.2 mg/dL (ref 0.0–1.0)
pH: 5.5 (ref 5.0–8.0)

## 2013-02-11 LAB — URINE MICROSCOPIC-ADD ON

## 2013-02-11 NOTE — ED Notes (Signed)
Pt presents to the ED with a complaint of dysuria.  Pt states that she has been having back pain for about a week.  Pt states that today she had painful burning urination.

## 2013-02-11 NOTE — ED Notes (Signed)
Called to go room 2, no answer

## 2013-02-12 NOTE — ED Notes (Signed)
Pt still not in lobby, removing from system

## 2013-02-13 ENCOUNTER — Encounter (HOSPITAL_COMMUNITY): Payer: Self-pay | Admitting: Emergency Medicine

## 2013-02-13 ENCOUNTER — Emergency Department (HOSPITAL_COMMUNITY)
Admission: EM | Admit: 2013-02-13 | Discharge: 2013-02-13 | Disposition: A | Discharge status: Home / Self Care | Payer: Medicaid Other | Attending: Emergency Medicine | Admitting: Emergency Medicine

## 2013-02-13 DIAGNOSIS — F17200 Nicotine dependence, unspecified, uncomplicated: Secondary | ICD-10-CM | POA: Insufficient documentation

## 2013-02-13 DIAGNOSIS — Z792 Long term (current) use of antibiotics: Secondary | ICD-10-CM | POA: Insufficient documentation

## 2013-02-13 DIAGNOSIS — I10 Essential (primary) hypertension: Secondary | ICD-10-CM | POA: Insufficient documentation

## 2013-02-13 DIAGNOSIS — Z79899 Other long term (current) drug therapy: Secondary | ICD-10-CM | POA: Insufficient documentation

## 2013-02-13 DIAGNOSIS — N390 Urinary tract infection, site not specified: Secondary | ICD-10-CM | POA: Insufficient documentation

## 2013-02-13 DIAGNOSIS — M129 Arthropathy, unspecified: Secondary | ICD-10-CM | POA: Insufficient documentation

## 2013-02-13 DIAGNOSIS — E785 Hyperlipidemia, unspecified: Secondary | ICD-10-CM | POA: Insufficient documentation

## 2013-02-13 DIAGNOSIS — E119 Type 2 diabetes mellitus without complications: Secondary | ICD-10-CM | POA: Insufficient documentation

## 2013-02-13 DIAGNOSIS — K219 Gastro-esophageal reflux disease without esophagitis: Secondary | ICD-10-CM | POA: Insufficient documentation

## 2013-02-13 LAB — URINE CULTURE: Colony Count: >=100000

## 2013-02-13 MED ORDER — CEPHALEXIN 500 MG PO CAPS: 500.0000 mg | ORAL_CAPSULE | Freq: Four times a day (QID) | ORAL | Status: DC

## 2013-02-13 MED ORDER — PHENAZOPYRIDINE HCL 200 MG PO TABS: 200.0000 mg | ORAL_TABLET | Freq: Three times a day (TID) | ORAL | Status: DC

## 2013-02-13 NOTE — ED Notes (Signed)
Pt presenting to ed with c/o vaginal pressure with dysuria and burning with urination x 2 days pt states she was here x 2 days but she left due to ER being so crowded

## 2013-02-13 NOTE — ED Provider Notes (Addendum)
CSN: [id number]     Arrival date & time 02/13/13  1501 History   First MD Initiated Contact with Patient 02/13/13 1520     Chief Complaint  Patient presents with  . Dysuria    Patient is a 52 y.o. female presenting with dysuria. The history is provided by the patient.  Dysuria Associated symptoms: no abdominal pain, no fever, no nausea, no vaginal discharge and no vomiting   Pt thinks she has a urinary tract infection.  She tried to call her doctor but could not get an appointment until the 29th.  She has had pressure sensation.  It increases with urination.  She has to urinate frequently and small amounts.  No vomiting.   No fever.  Patient tried to be seen in the emergency room the other day. She left before she was seen because of the long wait.  Past Medical History  Diagnosis Date  . Diabetes mellitus   . Arthritis   . Hypertension   . Hyperlipidemia   . GERD (gastroesophageal reflux disease)   . Sleep apnea     pt sts does not use "cause i dont think I need it anymore".   Past Surgical History  Procedure Laterality Date  . Cholecystectomy    . Incision and drainage abscess Left 12/22/2012    Procedure: INCISION AND DRAINAGE ABSCESS;  Surgeon: Dalia Heading, MD;  Location: [facility];  Service: General;  Laterality: Left;   No family history on file. History  Substance Use Topics  . Smoking status: Current Every Day Smoker -- 0.50 packs/day for 40 years    Types: Cigarettes  . Smokeless tobacco: Not on file  . Alcohol Use: No   OB History   Grav Para Term Preterm Abortions TAB SAB Ect Mult Living                 Review of Systems  Constitutional: Negative for fever.  Gastrointestinal: Negative for nausea, vomiting, abdominal pain and diarrhea.  Endocrine: Negative for polyuria.  Genitourinary: Positive for dysuria. Negative for vaginal bleeding and vaginal discharge.  All other systems reviewed and are negative.    Allergies  Review of patient's allergies indicates  no known allergies.  Home Medications   Current Outpatient Rx  Name  Route  Sig  Dispense  Refill  . amLODipine (NORVASC) 5 MG tablet   Oral   Take 5 mg by mouth daily.         . cephALEXin (KEFLEX) 500 MG capsule   Oral   Take 1 capsule (500 mg total) by mouth 4 (four) times daily.   20 capsule   0   . cloNIDine (CATAPRES) 0.1 MG tablet   Oral   Take 0.1 mg by mouth 2 (two) times daily.         Marland Kitchen glucose monitoring kit (FREESTYLE) monitoring kit   Does not apply   1 each by Does not apply route as needed for other.   1 each   0   . ibuprofen (ADVIL,MOTRIN) 800 MG tablet   Oral   Take 800 mg by mouth 3 (three) times daily as needed for pain.         . metFORMIN (GLUCOPHAGE) 1000 MG tablet   Oral   Take 1,000 mg by mouth 2 (two) times daily with a meal.         . methocarbamol (ROBAXIN) 500 MG tablet   Oral   Take 1 tablet (500 mg total) by mouth  2 (two) times daily.   20 tablet   0   . omeprazole (PRILOSEC) 40 MG capsule   Oral   Take 40 mg by mouth daily.         . phenazopyridine (PYRIDIUM) 200 MG tablet   Oral   Take 1 tablet (200 mg total) by mouth 3 (three) times daily.   6 tablet   0   . saxagliptin HCl (ONGLYZA) 5 MG TABS tablet   Oral   Take 5 mg by mouth daily.         . simvastatin (ZOCOR) 20 MG tablet      Take 1 tablet daily for cholesterol control          . solifenacin (VESICARE) 5 MG tablet   Oral   Take 5 mg by mouth daily.          . traMADol (ULTRAM) 50 MG tablet   Oral   Take 1 tablet (50 mg total) by mouth every 6 (six) hours as needed for pain.   15 tablet   0   . valsartan-hydrochlorothiazide (DIOVAN-HCT) 320-12.5 MG per tablet   Oral   Take 1 tablet by mouth daily.          BP 88/60  Pulse 87  Temp(Src) 98.3 F (36.8 C) (Oral)  Resp 14  SpO2 100% Physical Exam  Nursing note and vitals reviewed. Constitutional: She appears well-developed and well-nourished. No distress.  HENT:  Head:  Normocephalic and atraumatic.  Right Ear: External ear normal.  Left Ear: External ear normal.  Eyes: Conjunctivae are normal. Right eye exhibits no discharge. Left eye exhibits no discharge. No scleral icterus.  Neck: Neck supple. No tracheal deviation present.  Cardiovascular: Normal rate.   Pulmonary/Chest: Effort normal. No stridor. No respiratory distress.  Abdominal: She exhibits no mass. There is no tenderness. There is no rebound, no guarding and no CVA tenderness.  Musculoskeletal: She exhibits no edema.  Neurological: She is alert. Cranial nerve deficit: no gross deficits.  Skin: Skin is warm and dry. No rash noted.  Psychiatric: She has a normal mood and affect.    ED Course  Procedures (including critical care time) Labs Review Urine culture   Status: Final result Visible to patient: This result is not viewable by the patient. Next appt: None           2d ago    Specimen Description URINE, CLEAN CATCH     Special Requests NONE     Culture Setup Time 02/12/2013 04:51 Performed at [facility] Count >=100,000 COLONIES/ML Performed at [facility]    Culture ESCHERICHIA COLI Performed at [facility]    Report Status 02/13/2013 FINAL     Organism ID, Bacteria ESCHERICHIA COLI      Imaging Review No results found.  MDM   1. UTI (urinary tract infection)     The patient had a urine culture performed in September 21, 2 days ago. The results revealed Escherichia coli that is sensitive to cefazolin and Cipro. I will start the patient on Keflex .  Patient is afebrile. She is not having any vomiting. I doubt pyelonephritis. Her symptoms are consistent with a simple urinary tract infection   Celene Kras, MD 02/13/13 1540  Celene Kras, MD 02/13/13 [phone]

## 2013-02-19 ENCOUNTER — Other Ambulatory Visit (HOSPITAL_COMMUNITY)
Admission: RE | Admit: 2013-02-19 | Discharge: 2013-02-19 | Disposition: A | Discharge status: Home / Self Care | Payer: Medicaid Other | Source: Ambulatory Visit | Attending: Family Medicine | Admitting: Family Medicine

## 2013-02-19 ENCOUNTER — Other Ambulatory Visit: Payer: Self-pay | Admitting: Family Medicine

## 2013-02-19 DIAGNOSIS — Z01419 Encounter for gynecological examination (general) (routine) without abnormal findings: Secondary | ICD-10-CM | POA: Insufficient documentation

## 2013-05-29 ENCOUNTER — Other Ambulatory Visit (HOSPITAL_COMMUNITY): Payer: Self-pay | Admitting: Internal Medicine

## 2013-05-29 DIAGNOSIS — Z139 Encounter for screening, unspecified: Secondary | ICD-10-CM

## 2013-05-31 ENCOUNTER — Ambulatory Visit (HOSPITAL_COMMUNITY)
Admission: RE | Admit: 2013-05-31 | Discharge: 2013-05-31 | Disposition: A | Discharge status: Home / Self Care | Payer: Medicaid Other | Source: Ambulatory Visit | Attending: Internal Medicine | Admitting: Internal Medicine

## 2013-05-31 DIAGNOSIS — Z139 Encounter for screening, unspecified: Secondary | ICD-10-CM

## 2013-05-31 DIAGNOSIS — Z1231 Encounter for screening mammogram for malignant neoplasm of breast: Secondary | ICD-10-CM | POA: Insufficient documentation

## 2013-06-04 ENCOUNTER — Emergency Department (HOSPITAL_COMMUNITY): Payer: Medicaid Other

## 2013-06-04 ENCOUNTER — Emergency Department (HOSPITAL_COMMUNITY)
Admission: EM | Admit: 2013-06-04 | Discharge: 2013-06-04 | Disposition: A | Discharge status: Home / Self Care | Payer: Medicaid Other | Attending: Emergency Medicine | Admitting: Emergency Medicine

## 2013-06-04 ENCOUNTER — Encounter (HOSPITAL_COMMUNITY): Payer: Self-pay | Admitting: Emergency Medicine

## 2013-06-04 DIAGNOSIS — K219 Gastro-esophageal reflux disease without esophagitis: Secondary | ICD-10-CM | POA: Insufficient documentation

## 2013-06-04 DIAGNOSIS — S0993XA Unspecified injury of face, initial encounter: Secondary | ICD-10-CM | POA: Insufficient documentation

## 2013-06-04 DIAGNOSIS — F17200 Nicotine dependence, unspecified, uncomplicated: Secondary | ICD-10-CM | POA: Insufficient documentation

## 2013-06-04 DIAGNOSIS — S46909A Unspecified injury of unspecified muscle, fascia and tendon at shoulder and upper arm level, unspecified arm, initial encounter: Secondary | ICD-10-CM | POA: Insufficient documentation

## 2013-06-04 DIAGNOSIS — I10 Essential (primary) hypertension: Secondary | ICD-10-CM | POA: Insufficient documentation

## 2013-06-04 DIAGNOSIS — Z79899 Other long term (current) drug therapy: Secondary | ICD-10-CM | POA: Insufficient documentation

## 2013-06-04 DIAGNOSIS — S199XXA Unspecified injury of neck, initial encounter: Secondary | ICD-10-CM

## 2013-06-04 DIAGNOSIS — X500XXA Overexertion from strenuous movement or load, initial encounter: Secondary | ICD-10-CM | POA: Insufficient documentation

## 2013-06-04 DIAGNOSIS — E119 Type 2 diabetes mellitus without complications: Secondary | ICD-10-CM | POA: Insufficient documentation

## 2013-06-04 DIAGNOSIS — Y9389 Activity, other specified: Secondary | ICD-10-CM | POA: Insufficient documentation

## 2013-06-04 DIAGNOSIS — Y929 Unspecified place or not applicable: Secondary | ICD-10-CM | POA: Insufficient documentation

## 2013-06-04 DIAGNOSIS — R51 Headache: Secondary | ICD-10-CM

## 2013-06-04 DIAGNOSIS — R519 Headache, unspecified: Secondary | ICD-10-CM

## 2013-06-04 DIAGNOSIS — S4980XA Other specified injuries of shoulder and upper arm, unspecified arm, initial encounter: Secondary | ICD-10-CM | POA: Insufficient documentation

## 2013-06-04 DIAGNOSIS — S0990XA Unspecified injury of head, initial encounter: Secondary | ICD-10-CM | POA: Insufficient documentation

## 2013-06-04 DIAGNOSIS — M129 Arthropathy, unspecified: Secondary | ICD-10-CM | POA: Insufficient documentation

## 2013-06-04 DIAGNOSIS — E785 Hyperlipidemia, unspecified: Secondary | ICD-10-CM | POA: Insufficient documentation

## 2013-06-04 LAB — GLUCOSE, CAPILLARY: Glucose-Capillary: 161 mg/dL — ABNORMAL HIGH (ref 70–99)

## 2013-06-04 MED ORDER — DIAZEPAM 5 MG PO TABS
5.0000 mg | ORAL_TABLET | Freq: Once | ORAL | Status: AC
  Administered 2013-06-04: 5 mg via ORAL
  Filled 2013-06-04: qty 1

## 2013-06-04 MED ORDER — PROMETHAZINE HCL 12.5 MG PO TABS
25.0000 mg | ORAL_TABLET | Freq: Once | ORAL | Status: AC
  Administered 2013-06-04: 25 mg via ORAL
  Filled 2013-06-04: qty 2

## 2013-06-04 MED ORDER — HYDROCODONE-ACETAMINOPHEN 5-325 MG PO TABS
2.0000 | ORAL_TABLET | Freq: Once | ORAL | Status: AC
  Administered 2013-06-04: 2 via ORAL
  Filled 2013-06-04: qty 2

## 2013-06-04 MED ORDER — KETOROLAC TROMETHAMINE 10 MG PO TABS
10.0000 mg | ORAL_TABLET | Freq: Once | ORAL | Status: AC
  Administered 2013-06-04: 10 mg via ORAL
  Filled 2013-06-04: qty 1

## 2013-06-04 NOTE — ED Provider Notes (Signed)
CSN: [id number]     Arrival date & time 06/04/13  1241 History   First MD Initiated Contact with Patient 06/04/13 1340     Chief Complaint  Patient presents with  . Headache  . Shoulder Pain   (Consider location/radiation/quality/duration/timing/severity/associated sxs/prior Treatment) Patient is a 53 y.o. female presenting with headaches and shoulder pain. The history is provided by the patient.  Headache Pain location:  Occipital Radiates to:  R shoulder and R neck Severity currently:  7/10 Onset quality:  Gradual Duration:  3 days Timing:  Intermittent Progression:  Worsening Similar to prior headaches: no   Context comment:  Pt reports a pop in the neck and right shoulder. she has had a posterior headache since that time Relieved by:  Nothing Worsened by:  Nothing tried Associated symptoms: neck pain   Associated symptoms: no abdominal pain, no back pain, no blurred vision, no cough, no dizziness, no fever, no focal weakness, no nausea, no photophobia, no seizures, no syncope and no visual change   Shoulder Pain Associated symptoms include headaches and neck pain. Pertinent negatives include no abdominal pain, arthralgias, chest pain, coughing, fever, nausea or visual change.    Past Medical History  Diagnosis Date  . Diabetes mellitus   . Arthritis   . Hypertension   . Hyperlipidemia   . GERD (gastroesophageal reflux disease)   . Sleep apnea     pt sts does not use "cause i dont think I need it anymore".   Past Surgical History  Procedure Laterality Date  . Cholecystectomy    . Incision and drainage abscess Left 12/22/2012    Procedure: INCISION AND DRAINAGE ABSCESS;  Surgeon: Jamesetta So, MD;  Location: [facility];  Service: General;  Laterality: Left;   No family history on file. History  Substance Use Topics  . Smoking status: Current Every Day Smoker -- 0.50 packs/day for 40 years    Types: Cigarettes  . Smokeless tobacco: Not on file  . Alcohol Use: No   OB  History   Grav Para Term Preterm Abortions TAB SAB Ect Mult Living                 Review of Systems  Constitutional: Negative for fever and activity change.       All ROS Neg except as noted in HPI  HENT: Negative for nosebleeds.   Eyes: Negative for blurred vision, photophobia and discharge.  Respiratory: Negative for cough, shortness of breath and wheezing.   Cardiovascular: Negative for chest pain, palpitations and syncope.  Gastrointestinal: Negative for nausea, abdominal pain and blood in stool.  Genitourinary: Negative for dysuria, frequency and hematuria.  Musculoskeletal: Positive for neck pain. Negative for arthralgias and back pain.  Skin: Negative.   Neurological: Positive for headaches. Negative for dizziness, focal weakness, seizures and speech difficulty.  Psychiatric/Behavioral: Negative for hallucinations and confusion.    Allergies  Review of patient's allergies indicates no known allergies.  Home Medications   Current Outpatient Rx  Name  Route  Sig  Dispense  Refill  . amLODipine (NORVASC) 5 MG tablet   Oral   Take 5 mg by mouth daily.         . cloNIDine (CATAPRES) 0.1 MG tablet   Oral   Take 0.1 mg by mouth 2 (two) times daily.         Marland Kitchen ibuprofen (ADVIL,MOTRIN) 800 MG tablet   Oral   Take 800 mg by mouth 3 (three) times daily as needed for  pain.         . metFORMIN (GLUCOPHAGE) 1000 MG tablet   Oral   Take 1,000 mg by mouth 2 (two) times daily with a meal.         . methocarbamol (ROBAXIN) 500 MG tablet   Oral   Take 1 tablet (500 mg total) by mouth 2 (two) times daily.   20 tablet   0   . omeprazole (PRILOSEC) 40 MG capsule   Oral   Take 40 mg by mouth daily.         . saxagliptin HCl (ONGLYZA) 5 MG TABS tablet   Oral   Take 5 mg by mouth daily.         . simvastatin (ZOCOR) 20 MG tablet   Oral   Take 20 mg by mouth daily.          . solifenacin (VESICARE) 5 MG tablet   Oral   Take 5 mg by mouth daily.          .  traMADol (ULTRAM) 50 MG tablet   Oral   Take 1 tablet (50 mg total) by mouth every 6 (six) hours as needed for pain.   15 tablet   0   . valsartan-hydrochlorothiazide (DIOVAN-HCT) 320-12.5 MG per tablet   Oral   Take 1 tablet by mouth daily.         Marland Kitchen glucose monitoring kit (FREESTYLE) monitoring kit   Does not apply   1 each by Does not apply route as needed for other.   1 each   0    BP 192/87  Pulse 64  Temp(Src) 97.7 F (36.5 C) (Oral)  Resp 20  Ht 5' 3" (1.6 m)  Wt 213 lb (96.616 kg)  BMI 37.74 kg/m2  SpO2 98% Physical Exam  Nursing note and vitals reviewed. Constitutional: She is oriented to person, place, and time. She appears well-developed and well-nourished.  Non-toxic appearance.  Pt is tearful, states she has never had a headache like this before.  HENT:  Head: Normocephalic.  Right Ear: Tympanic membrane and external ear normal.  Left Ear: Tympanic membrane and external ear normal.  Eyes: EOM and lids are normal. Pupils are equal, round, and reactive to light.  Neck: Normal range of motion. Neck supple. Carotid bruit is not present.  Cardiovascular: Normal rate, regular rhythm, normal heart sounds, intact distal pulses and normal pulses.   Pulmonary/Chest: Breath sounds normal. No respiratory distress.  Abdominal: Soft. Bowel sounds are normal. There is no tenderness. There is no guarding.  Musculoskeletal: Normal range of motion.  Lymphadenopathy:       Head (right side): No submandibular adenopathy present.       Head (left side): No submandibular adenopathy present.    She has no cervical adenopathy.  Neurological: She is alert and oriented to person, place, and time. She has normal strength. No cranial nerve deficit or sensory deficit. She exhibits normal muscle tone. Coordination normal.  No gross neurologic deficit appreciated. Gait is intact. Speech is clear and intact. No motor or sensory changes appreciated.  Skin: Skin is warm and dry.   Psychiatric: She has a normal mood and affect. Her speech is normal.    ED Course  Procedures (including critical care time) Labs Review Labs Reviewed  GLUCOSE, CAPILLARY - Abnormal; Notable for the following:    Glucose-Capillary 161 (*)    All other components within normal limits   Imaging Review No results found.  EKG Interpretation  None       MDM  No diagnosis found. *I have reviewed nursing notes, vital signs, and all appropriate lab and imaging results for this patient.**  cbg 161. CT head neg for intracranial findings. Pt ambulatory in ED. Recheck after pain meds is neg for neuro deficit. Pt to follow up with PCP for recheck of the headaches.  Lenox Ahr, PA-C 06/05/13 2224

## 2013-06-04 NOTE — Discharge Instructions (Signed)
Your CT scan is negative for any acute intracranial problem. Your examination does not reveal any acute neurologic deficit. Please see Dr. Felecia ShellingFanta for office evaluation and management of your headaches.

## 2013-06-04 NOTE — ED Notes (Signed)
On Thursday, pt raised her rt arm up, felt a pop, and has had pain in shoulder and headache , neck pain since then.  No relief with  Motrin or aleve.  Has not had similar problem in past.

## 2013-06-04 NOTE — ED Notes (Signed)
Pt states her neck popped three days ago while lying in bed and she has had a headache since. Also right shoulder "popped three days ago" and has been popping since.

## 2013-06-09 NOTE — ED Provider Notes (Signed)
Medical screening examination/treatment/procedure(s) were performed by non-physician practitioner and as supervising physician I was immediately available for consultation/collaboration.  EKG Interpretation   None         Ceferino Lang, MD 06/09/13 0702 

## 2013-06-11 ENCOUNTER — Emergency Department (HOSPITAL_COMMUNITY)
Admission: EM | Admit: 2013-06-11 | Discharge: 2013-06-11 | Disposition: A | Discharge status: Home / Self Care | Payer: Medicaid Other | Attending: Emergency Medicine | Admitting: Emergency Medicine

## 2013-06-11 ENCOUNTER — Encounter (HOSPITAL_COMMUNITY): Payer: Self-pay | Admitting: Emergency Medicine

## 2013-06-11 DIAGNOSIS — Z91199 Patient's noncompliance with other medical treatment and regimen due to unspecified reason: Secondary | ICD-10-CM | POA: Insufficient documentation

## 2013-06-11 DIAGNOSIS — R519 Headache, unspecified: Secondary | ICD-10-CM

## 2013-06-11 DIAGNOSIS — Z9119 Patient's noncompliance with other medical treatment and regimen: Secondary | ICD-10-CM | POA: Insufficient documentation

## 2013-06-11 DIAGNOSIS — R1110 Vomiting, unspecified: Secondary | ICD-10-CM | POA: Insufficient documentation

## 2013-06-11 DIAGNOSIS — R51 Headache: Secondary | ICD-10-CM | POA: Insufficient documentation

## 2013-06-11 DIAGNOSIS — M129 Arthropathy, unspecified: Secondary | ICD-10-CM | POA: Insufficient documentation

## 2013-06-11 DIAGNOSIS — I10 Essential (primary) hypertension: Secondary | ICD-10-CM

## 2013-06-11 DIAGNOSIS — K219 Gastro-esophageal reflux disease without esophagitis: Secondary | ICD-10-CM | POA: Insufficient documentation

## 2013-06-11 DIAGNOSIS — Z79899 Other long term (current) drug therapy: Secondary | ICD-10-CM | POA: Insufficient documentation

## 2013-06-11 DIAGNOSIS — Z791 Long term (current) use of non-steroidal anti-inflammatories (NSAID): Secondary | ICD-10-CM | POA: Insufficient documentation

## 2013-06-11 DIAGNOSIS — F17200 Nicotine dependence, unspecified, uncomplicated: Secondary | ICD-10-CM | POA: Insufficient documentation

## 2013-06-11 DIAGNOSIS — E119 Type 2 diabetes mellitus without complications: Secondary | ICD-10-CM | POA: Insufficient documentation

## 2013-06-11 DIAGNOSIS — E785 Hyperlipidemia, unspecified: Secondary | ICD-10-CM | POA: Insufficient documentation

## 2013-06-11 MED ORDER — DIPHENHYDRAMINE HCL 50 MG/ML IJ SOLN
25.0000 mg | Freq: Once | INTRAMUSCULAR | Status: AC
  Administered 2013-06-11: 25 mg via INTRAVENOUS
  Filled 2013-06-11: qty 1

## 2013-06-11 MED ORDER — SODIUM CHLORIDE 0.9 % IV BOLUS (SEPSIS)
1000.0000 mL | Freq: Once | INTRAVENOUS | Status: AC
  Administered 2013-06-11: 1000 mL via INTRAVENOUS

## 2013-06-11 MED ORDER — METOCLOPRAMIDE HCL 5 MG/ML IJ SOLN
10.0000 mg | Freq: Once | INTRAMUSCULAR | Status: AC
  Administered 2013-06-11: 10 mg via INTRAVENOUS
  Filled 2013-06-11: qty 2

## 2013-06-11 MED ORDER — KETOROLAC TROMETHAMINE 30 MG/ML IJ SOLN
30.0000 mg | Freq: Once | INTRAMUSCULAR | Status: AC
  Administered 2013-06-11: 30 mg via INTRAVENOUS
  Filled 2013-06-11: qty 1

## 2013-06-11 MED ORDER — CLONIDINE HCL 0.2 MG PO TABS
0.2000 mg | ORAL_TABLET | Freq: Once | ORAL | Status: AC
  Administered 2013-06-11: 0.2 mg via ORAL
  Filled 2013-06-11: qty 1

## 2013-06-11 MED ORDER — HYDROCODONE-ACETAMINOPHEN 5-325 MG PO TABS: 2.0000 | ORAL_TABLET | ORAL | Status: DC | PRN

## 2013-06-11 MED ORDER — PROMETHAZINE HCL 25 MG PO TABS: 25.0000 mg | ORAL_TABLET | Freq: Four times a day (QID) | ORAL | Status: DC | PRN

## 2013-06-11 NOTE — ED Notes (Signed)
Patient complaining of headache x 2 days. Reports vomiting started today. Denies any other complaints at this time.

## 2013-06-11 NOTE — Discharge Instructions (Signed)
You must get your blood pressure medicine filled in the morning.  Prescription for pain and nausea medicine

## 2013-06-12 NOTE — ED Provider Notes (Signed)
CSN: [id number]     Arrival date & time 06/11/13  1808 History   First MD Initiated Contact with Patient 06/11/13 2049     Chief Complaint  Patient presents with  . Headache  . Emesis   (Consider location/radiation/quality/duration/timing/severity/associated sxs/prior Treatment) HPI.... headache for 2 days with vomiting. Patient been noncompliant with her blood pressure medicine. No fever, chills, stiff neck, neuro deficits. She has tried over-the-counter medications with minimal relief. Severity is moderate.  Past Medical History  Diagnosis Date  . Diabetes mellitus   . Arthritis   . Hypertension   . Hyperlipidemia   . GERD (gastroesophageal reflux disease)   . Sleep apnea     pt sts does not use "cause i dont think I need it anymore".   Past Surgical History  Procedure Laterality Date  . Cholecystectomy    . Incision and drainage abscess Left 12/22/2012    Procedure: INCISION AND DRAINAGE ABSCESS;  Surgeon: Jamesetta So, MD;  Location: [facility];  Service: General;  Laterality: Left;   History reviewed. No pertinent family history. History  Substance Use Topics  . Smoking status: Current Every Day Smoker -- 0.50 packs/day for 40 years    Types: Cigarettes  . Smokeless tobacco: Not on file  . Alcohol Use: No   OB History   Grav Para Term Preterm Abortions TAB SAB Ect Mult Living                 Review of Systems  All other systems reviewed and are negative.    Allergies  Review of patient's allergies indicates no known allergies.  Home Medications   Current Outpatient Rx  Name  Route  Sig  Dispense  Refill  . amLODipine (NORVASC) 5 MG tablet   Oral   Take 5 mg by mouth daily.         . cloNIDine (CATAPRES) 0.1 MG tablet   Oral   Take 0.1 mg by mouth 2 (two) times daily.         Marland Kitchen ibuprofen (ADVIL,MOTRIN) 800 MG tablet   Oral   Take 800 mg by mouth 3 (three) times daily as needed for pain.         . metFORMIN (GLUCOPHAGE) 1000 MG tablet   Oral  Take 1,000 mg by mouth 2 (two) times daily with a meal.         . methocarbamol (ROBAXIN) 500 MG tablet   Oral   Take 1 tablet (500 mg total) by mouth 2 (two) times daily.   20 tablet   0   . naproxen sodium (ANAPROX) 220 MG tablet   Oral   Take 440 mg by mouth 2 (two) times daily with a meal.         . omeprazole (PRILOSEC) 40 MG capsule   Oral   Take 40 mg by mouth daily.         . saxagliptin HCl (ONGLYZA) 5 MG TABS tablet   Oral   Take 5 mg by mouth daily.         . simvastatin (ZOCOR) 20 MG tablet   Oral   Take 20 mg by mouth daily.          . solifenacin (VESICARE) 5 MG tablet   Oral   Take 5 mg by mouth daily.          . traMADol (ULTRAM) 50 MG tablet   Oral   Take 1 tablet (50 mg total) by mouth every 6 (  six) hours as needed for pain.   15 tablet   0   . valsartan-hydrochlorothiazide (DIOVAN-HCT) 320-12.5 MG per tablet   Oral   Take 1 tablet by mouth daily.         Marland Kitchen glucose monitoring kit (FREESTYLE) monitoring kit   Does not apply   1 each by Does not apply route as needed for other.   1 each   0   . HYDROcodone-acetaminophen (NORCO) 5-325 MG per tablet   Oral   Take 2 tablets by mouth every 4 (four) hours as needed.   20 tablet   0   . promethazine (PHENERGAN) 25 MG tablet   Oral   Take 1 tablet (25 mg total) by mouth every 6 (six) hours as needed for nausea or vomiting.   20 tablet   0    BP 146/79  Pulse 67  Temp(Src) 97.6 F (36.4 C) (Oral)  Resp 22  Ht 5' 2" (1.575 m)  Wt 213 lb (96.616 kg)  BMI 38.95 kg/m2  SpO2 97% Physical Exam  Nursing note and vitals reviewed. Constitutional: She is oriented to person, place, and time.  No meningeal signs  HENT:  Head: Normocephalic and atraumatic.  Eyes: Conjunctivae and EOM are normal. Pupils are equal, round, and reactive to light.  Neck: Normal range of motion. Neck supple.  Cardiovascular: Normal rate, regular rhythm and normal heart sounds.   Pulmonary/Chest: Effort  normal and breath sounds normal.  Abdominal: Soft. Bowel sounds are normal.  Musculoskeletal: Normal range of motion.  Neurological: She is alert and oriented to person, place, and time.  Skin: Skin is warm and dry.  Psychiatric: She has a normal mood and affect. Her behavior is normal.    ED Course  Procedures (including critical care time) Labs Review Labs Reviewed - No data to display Imaging Review No results found.  EKG Interpretation   None       MDM   1. Headache   2. Hypertension    Patient feels better after IV fluids and pain management. No neurological deficits.  Pressure reduced with clonidine. She is strongly encouraged to restart her blood pressure medicine.    Nat Christen, MD 06/12/13 2215

## 2013-12-20 ENCOUNTER — Emergency Department (HOSPITAL_COMMUNITY)
Admission: EM | Admit: 2013-12-20 | Discharge: 2013-12-20 | Disposition: A | Discharge status: Home / Self Care | Payer: Medicaid Other | Attending: Emergency Medicine | Admitting: Emergency Medicine

## 2013-12-20 ENCOUNTER — Encounter (HOSPITAL_COMMUNITY): Payer: Self-pay | Admitting: Emergency Medicine

## 2013-12-20 DIAGNOSIS — E7800 Pure hypercholesterolemia, unspecified: Secondary | ICD-10-CM | POA: Diagnosis not present

## 2013-12-20 DIAGNOSIS — E785 Hyperlipidemia, unspecified: Secondary | ICD-10-CM | POA: Insufficient documentation

## 2013-12-20 DIAGNOSIS — M129 Arthropathy, unspecified: Secondary | ICD-10-CM | POA: Diagnosis not present

## 2013-12-20 DIAGNOSIS — F17200 Nicotine dependence, unspecified, uncomplicated: Secondary | ICD-10-CM | POA: Insufficient documentation

## 2013-12-20 DIAGNOSIS — F329 Major depressive disorder, single episode, unspecified: Secondary | ICD-10-CM | POA: Diagnosis not present

## 2013-12-20 DIAGNOSIS — Z8719 Personal history of other diseases of the digestive system: Secondary | ICD-10-CM | POA: Diagnosis not present

## 2013-12-20 DIAGNOSIS — Z79899 Other long term (current) drug therapy: Secondary | ICD-10-CM | POA: Diagnosis not present

## 2013-12-20 DIAGNOSIS — N63 Unspecified lump in unspecified breast: Secondary | ICD-10-CM | POA: Insufficient documentation

## 2013-12-20 DIAGNOSIS — E119 Type 2 diabetes mellitus without complications: Secondary | ICD-10-CM | POA: Diagnosis not present

## 2013-12-20 DIAGNOSIS — I10 Essential (primary) hypertension: Secondary | ICD-10-CM | POA: Insufficient documentation

## 2013-12-20 DIAGNOSIS — N644 Mastodynia: Secondary | ICD-10-CM | POA: Diagnosis present

## 2013-12-20 DIAGNOSIS — F3289 Other specified depressive episodes: Secondary | ICD-10-CM | POA: Insufficient documentation

## 2013-12-20 DIAGNOSIS — Z7982 Long term (current) use of aspirin: Secondary | ICD-10-CM | POA: Diagnosis not present

## 2013-12-20 DIAGNOSIS — Z791 Long term (current) use of non-steroidal anti-inflammatories (NSAID): Secondary | ICD-10-CM | POA: Diagnosis not present

## 2013-12-20 DIAGNOSIS — Z792 Long term (current) use of antibiotics: Secondary | ICD-10-CM | POA: Diagnosis not present

## 2013-12-20 MED ORDER — HYDROCODONE-ACETAMINOPHEN 7.5-325 MG PO TABS: 1.0000 | ORAL_TABLET | ORAL | Status: DC | PRN

## 2013-12-20 NOTE — ED Notes (Signed)
Cyst on left breast that is returning from last year.  Had cyst removed last from left breast.

## 2013-12-20 NOTE — Discharge Instructions (Signed)
Someone from the ultrasound team will call you with an appointment time for your ultrasound on Tuesday. Please use Tylenol for mild pain, Norco for more severe pain. Please begin planning for follow up appointment with Dr. Felecia ShellingFanta.

## 2013-12-20 NOTE — ED Provider Notes (Signed)
CSN: [id number]     Arrival date & time 12/20/13  0919 History   First MD Initiated Contact with Patient 12/20/13 [phone]     Chief Complaint  Patient presents with  . Breast Problem     (Consider location/radiation/quality/duration/timing/severity/associated sxs/prior Treatment) HPI Comments: aPatient is a 53 year old female who presents to the emergency department with the complaint of" cyst on the left breast". Patient to the cyst to the left breast approximately 2 days ago. She states that this is very similar to when she had surgically removed from the same breast in 2014. She is concerned that the area seems to have come back. She's not had any fever or chills. There's been no drainage from the nipple area. There's been no noted deformity of the breast. She's not had any recent injury to the breast, in particular there has been no mouth/ tooth wound to the area.  The history is provided by the patient.    Past Medical History  Diagnosis Date  . Diabetes mellitus   . Arthritis   . Hypertension   . Hyperlipidemia   . GERD (gastroesophageal reflux disease)   . Sleep apnea     pt sts does not use "cause i dont think I need it anymore".  . Depression   . Hypercholesterolemia    Past Surgical History  Procedure Laterality Date  . Cholecystectomy    . Incision and drainage abscess Left 12/22/2012    Procedure: INCISION AND DRAINAGE ABSCESS;  Surgeon: Dalia HeadingMark A Jenkins, MD;  Location: [facility];  Service: General;  Laterality: Left;   Family History  Problem Relation Age of Onset  . Hypertension Mother   . CVA Mother   . CAD Father   . Hypercholesterolemia Father    History  Substance Use Topics  . Smoking status: Current Every Day Smoker -- 0.50 packs/day for 40 years    Types: Cigarettes  . Smokeless tobacco: Not on file  . Alcohol Use: No   OB History   Grav Para Term Preterm Abortions TAB SAB Ect Mult Living                 Review of Systems    Allergies   Cymbalta  Home Medications   Prior to Admission medications   Medication Sig Start Date End Date Taking? Authorizing Provider  amLODipine (NORVASC) 5 MG tablet Take 5 mg by mouth daily.   Yes Historical Provider, MD  aspirin EC 81 MG tablet Take 81 mg by mouth daily.   Yes Historical Provider, MD  buPROPion (WELLBUTRIN XL) 150 MG 24 hr tablet Take 150 mg by mouth daily.   Yes Historical Provider, MD  cloNIDine (CATAPRES) 0.1 MG tablet Take 0.1 mg by mouth 2 (two) times daily.   Yes Historical Provider, MD  cyclobenzaprine (FLEXERIL) 10 MG tablet Take 10 mg by mouth daily.    Yes Historical Provider, MD  FLUoxetine (PROZAC) 40 MG capsule Take 40 mg by mouth daily.   Yes Historical Provider, MD  glimepiride (AMARYL) 4 MG tablet Take 4 mg by mouth daily with breakfast.   Yes Historical Provider, MD  metFORMIN (GLUCOPHAGE) 1000 MG tablet Take 1,000 mg by mouth 2 (two) times daily with a meal.   Yes Historical Provider, MD  Multiple Vitamin (MULTIVITAMIN WITH MINERALS) TABS tablet Take 1 tablet by mouth daily.   Yes Historical Provider, MD  naproxen sodium (ANAPROX) 220 MG tablet Take 220 mg by mouth daily as needed (pain).    Yes Historical  Provider, MD  saxagliptin HCl (ONGLYZA) 5 MG TABS tablet Take 5 mg by mouth daily.   Yes Historical Provider, MD  simvastatin (ZOCOR) 20 MG tablet Take 20 mg by mouth daily.    Yes Historical Provider, MD  solifenacin (VESICARE) 5 MG tablet Take 5 mg by mouth daily.    Yes Historical Provider, MD  valsartan-hydrochlorothiazide (DIOVAN-HCT) 320-12.5 MG per tablet Take 1 tablet by mouth daily.   Yes Historical Provider, MD   BP 156/81  Pulse 81  Temp(Src) 97.9 F (36.6 C) (Oral)  Resp 18  SpO2 97% Physical Exam  Nursing note and vitals reviewed. Constitutional: She is oriented to person, place, and time. She appears well-developed and well-nourished.  Non-toxic appearance.  HENT:  Head: Normocephalic.  Right Ear: Tympanic membrane and external ear  normal.  Left Ear: Tympanic membrane and external ear normal.  Eyes: EOM and lids are normal. Pupils are equal, round, and reactive to light.  Neck: Normal range of motion. Neck supple. Carotid bruit is not present.  Cardiovascular: Normal rate, regular rhythm, normal heart sounds, intact distal pulses and normal pulses.   Pulmonary/Chest: Breath sounds normal. No respiratory distress. Left breast exhibits mass and tenderness. Left breast exhibits no inverted nipple, no nipple discharge and no skin change.  Firm mass behind the left nipple. Does not extend beyond the areola .  Abdominal: Soft. Bowel sounds are normal. There is no tenderness. There is no guarding.  Musculoskeletal: Normal range of motion.  Lymphadenopathy:       Head (right side): No submandibular adenopathy present.       Head (left side): No submandibular adenopathy present.    She has no cervical adenopathy.  Neurological: She is alert and oriented to person, place, and time. She has normal strength. No cranial nerve deficit or sensory deficit.  Skin: Skin is warm and dry.  Psychiatric: She has a normal mood and affect. Her speech is normal.    ED Course  Procedures (including critical care time) Labs Review Labs Reviewed - No data to display  Imaging Review No results found.   EKG Interpretation None      MDM The patient has a tender firm mass behind the nipple of the left breast. There is no temperature elevation or tachycardia appreciated at this time. Arrangements have been made for the patient to have a mammogram and ultrasound of the left breast. A prescription for Norco 7.5 his been given to the patient. Patient is advised to see her primary physician or return to the emergency apartment if any changes or problems.    5:49 PM. After review of the chart it was noted to have a antibiotic was not ordered for the patient. Patient has history of diabetes. Review of the medical records also shows that the  previous cyst was consistent with abscess. Prescription for doxycycline 100 mg 1 twice a day and has been called in to the regional [facility] pharmacy. A message has been left on the patient's answering service with instructions on taking the medication and the importance of having this medication filled.    Final diagnoses:  None    **I have reviewed nursing notes, vital signs, and all appropriate lab and imaging results for this patient.Kathie Dike, PA-C 12/20/13 1750

## 2013-12-20 NOTE — ED Notes (Addendum)
Pt aware of outpatient [facility] appt for August 11 at 1045.

## 2013-12-22 NOTE — ED Provider Notes (Signed)
Medical screening examination/treatment/procedure(s) were performed by non-physician practitioner and as supervising physician I was immediately available for consultation/collaboration.   EKG Interpretation None        Manreet Kiernan M Duong Haydel, DO 12/22/13 0829 

## 2013-12-28 ENCOUNTER — Encounter (HOSPITAL_COMMUNITY): Payer: Self-pay | Admitting: Emergency Medicine

## 2013-12-28 ENCOUNTER — Inpatient Hospital Stay (HOSPITAL_COMMUNITY)
Admission: EM | Admit: 2013-12-28 | Discharge: 2013-12-30 | DRG: 601 | Disposition: A | Discharge status: Home / Self Care | Payer: Medicaid Other | Attending: Internal Medicine | Admitting: Internal Medicine

## 2013-12-28 DIAGNOSIS — Z8249 Family history of ischemic heart disease and other diseases of the circulatory system: Secondary | ICD-10-CM

## 2013-12-28 DIAGNOSIS — Z823 Family history of stroke: Secondary | ICD-10-CM

## 2013-12-28 DIAGNOSIS — M129 Arthropathy, unspecified: Secondary | ICD-10-CM | POA: Diagnosis present

## 2013-12-28 DIAGNOSIS — F17200 Nicotine dependence, unspecified, uncomplicated: Secondary | ICD-10-CM | POA: Diagnosis present

## 2013-12-28 DIAGNOSIS — E099 Drug or chemical induced diabetes mellitus without complications: Secondary | ICD-10-CM

## 2013-12-28 DIAGNOSIS — E119 Type 2 diabetes mellitus without complications: Secondary | ICD-10-CM | POA: Diagnosis present

## 2013-12-28 DIAGNOSIS — K219 Gastro-esophageal reflux disease without esophagitis: Secondary | ICD-10-CM | POA: Diagnosis present

## 2013-12-28 DIAGNOSIS — E7800 Pure hypercholesterolemia, unspecified: Secondary | ICD-10-CM | POA: Diagnosis present

## 2013-12-28 DIAGNOSIS — N61 Mastitis without abscess: Principal | ICD-10-CM | POA: Diagnosis present

## 2013-12-28 DIAGNOSIS — Z8349 Family history of other endocrine, nutritional and metabolic diseases: Secondary | ICD-10-CM

## 2013-12-28 DIAGNOSIS — G4730 Sleep apnea, unspecified: Secondary | ICD-10-CM | POA: Diagnosis present

## 2013-12-28 DIAGNOSIS — E785 Hyperlipidemia, unspecified: Secondary | ICD-10-CM | POA: Diagnosis present

## 2013-12-28 DIAGNOSIS — Z79899 Other long term (current) drug therapy: Secondary | ICD-10-CM

## 2013-12-28 DIAGNOSIS — I10 Essential (primary) hypertension: Secondary | ICD-10-CM | POA: Diagnosis present

## 2013-12-28 LAB — BASIC METABOLIC PANEL
Anion gap: 14 (ref 5–15)
BUN: 18 mg/dL (ref 6–23)
CO2: 27 mEq/L (ref 19–32)
Calcium: 9.3 mg/dL (ref 8.4–10.5)
Chloride: 98 mEq/L (ref 96–112)
Creatinine, Ser: 0.51 mg/dL (ref 0.50–1.10)
GFR calc Af Amer: >90 mL/min (ref >90)
GFR calc non Af Amer: >90 mL/min (ref >90)
Glucose, Bld: 198 mg/dL — ABNORMAL HIGH (ref 70–99)
Potassium: 3.8 mEq/L (ref 3.7–5.3)
Sodium: 139 mEq/L (ref 137–147)

## 2013-12-28 LAB — CBC WITH DIFFERENTIAL/PLATELET
Basophils Absolute: 0 10*3/uL (ref 0.0–0.1)
Basophils Relative: 0 % (ref 0–1)
Eosinophils Absolute: 0.2 10*3/uL (ref 0.0–0.7)
Eosinophils Relative: 2 % (ref 0–5)
HCT: 38.8 % (ref 36.0–46.0)
Hemoglobin: 13.3 g/dL (ref 12.0–15.0)
Lymphocytes Relative: 27 % (ref 12–46)
Lymphs Abs: 2.7 10*3/uL (ref 0.7–4.0)
MCH: 31.1 pg (ref 26.0–34.0)
MCHC: 34.3 g/dL (ref 30.0–36.0)
MCV: 90.7 fL (ref 78.0–100.0)
Monocytes Absolute: 0.6 10*3/uL (ref 0.1–1.0)
Monocytes Relative: 6 % (ref 3–12)
Neutro Abs: 6.4 10*3/uL (ref 1.7–7.7)
Neutrophils Relative %: 65 % (ref 43–77)
Platelets: 248 10*3/uL (ref 150–400)
RBC: 4.28 MIL/uL (ref 3.87–5.11)
RDW: 13.9 % (ref 11.5–15.5)
WBC: 9.9 10*3/uL (ref 4.0–10.5)

## 2013-12-28 LAB — CBG MONITORING, ED: Glucose-Capillary: 177 mg/dL — ABNORMAL HIGH (ref 70–99)

## 2013-12-28 LAB — LACTIC ACID, PLASMA: Lactic Acid, Venous: 2 mmol/L (ref 0.5–2.2)

## 2013-12-28 MED ORDER — ONDANSETRON 4 MG PO TBDP: 4.0000 mg | ORAL_TABLET | Freq: Once | ORAL | Status: AC

## 2013-12-28 MED ORDER — MORPHINE SULFATE 4 MG/ML IJ SOLN
8.0000 mg | Freq: Once | INTRAMUSCULAR | Status: DC
  Filled 2013-12-28: qty 2

## 2013-12-28 MED ORDER — MORPHINE SULFATE 4 MG/ML IJ SOLN
8.0000 mg | Freq: Once | INTRAMUSCULAR | Status: AC
  Administered 2013-12-28: 4 mg via INTRAVENOUS

## 2013-12-28 MED ORDER — ONDANSETRON HCL 4 MG/2ML IJ SOLN
4.0000 mg | Freq: Once | INTRAMUSCULAR | Status: DC
  Administered 2013-12-28: 4 mg via INTRAMUSCULAR
  Filled 2013-12-28: qty 2

## 2013-12-28 NOTE — ED Notes (Signed)
Pt reports she got her pain medication filled when she left the ED on 7/30 but then lost the pills.

## 2013-12-28 NOTE — ED Provider Notes (Signed)
CSN: [id number]     Arrival date & time 12/28/13  2100 History   First MD Initiated Contact with Patient 12/28/13 2207     Chief Complaint  Patient presents with  . Abscess     (Consider location/radiation/quality/duration/timing/severity/associated sxs/prior Treatment) HPI Comments: Patient is a 53 year old female who presents to the emergency department with a chief complaint of" an abscess of the breast". The patient states she was seen here in the emergency department on July 30 at which time she was noted to have a cyst versus mass versus abscess of the left breast. Arrangements were made for the patient to have ultrasound, but it was determined by radiology that she would need to have a mammogram first and then determine the need for ultrasound. This could not be scheduled until August 11. The patient was treated with doxycycline and Norco. The patient states that she did not get these medications filled because she had other medications that she get it filled. Today she accidentally bumped the left breast while taking a shower, she noted severe pain, and notes a significant increase in redness about the breast area. She presents now because of pain and of the redness. It is of note that the patient is diabetic.  Patient is a 53 y.o. female presenting with abscess. The history is provided by the patient.  Abscess Associated symptoms: no fever     Past Medical History  Diagnosis Date  . Diabetes mellitus   . Arthritis   . Hypertension   . Hyperlipidemia   . GERD (gastroesophageal reflux disease)   . Sleep apnea     pt sts does not use "cause i dont think I need it anymore".  . Depression   . Hypercholesterolemia    Past Surgical History  Procedure Laterality Date  . Cholecystectomy    . Incision and drainage abscess Left 12/22/2012    Procedure: INCISION AND DRAINAGE ABSCESS;  Surgeon: Dalia Heading, MD;  Location: [facility];  Service: General;  Laterality: Left;   Family History    Problem Relation Age of Onset  . Hypertension Mother   . CVA Mother   . CAD Father   . Hypercholesterolemia Father    History  Substance Use Topics  . Smoking status: Current Every Day Smoker -- 0.50 packs/day for 40 years    Types: Cigarettes  . Smokeless tobacco: Not on file  . Alcohol Use: No   OB History   Grav Para Term Preterm Abortions TAB SAB Ect Mult Living                 Review of Systems  Constitutional: Negative for fever, chills and activity change.       All ROS Neg except as noted in HPI  Eyes: Negative for photophobia and discharge.  Respiratory: Negative for cough, shortness of breath and wheezing.   Cardiovascular: Negative for chest pain and palpitations.  Gastrointestinal: Negative for abdominal pain and blood in stool.  Genitourinary: Negative for dysuria, frequency and hematuria.  Musculoskeletal: Negative for arthralgias, back pain and neck pain.  Skin: Negative.        Cyst versus mass of the  breasts  Neurological: Negative for dizziness, seizures and speech difficulty.  Psychiatric/Behavioral: Negative for hallucinations and confusion.      Allergies  Cymbalta  Home Medications   Prior to Admission medications   Medication Sig Start Date End Date Taking? Authorizing Provider  amLODipine (NORVASC) 5 MG tablet Take 5 mg by mouth daily.  Yes Historical Provider, MD  buPROPion (WELLBUTRIN XL) 150 MG 24 hr tablet Take 150 mg by mouth daily.   Yes Historical Provider, MD  cloNIDine (CATAPRES) 0.1 MG tablet Take 0.1 mg by mouth 2 (two) times daily.   Yes Historical Provider, MD  cyclobenzaprine (FLEXERIL) 10 MG tablet Take 10 mg by mouth daily.    Yes Historical Provider, MD  FLUoxetine (PROZAC) 40 MG capsule Take 40 mg by mouth daily.   Yes Historical Provider, MD  glimepiride (AMARYL) 4 MG tablet Take 4 mg by mouth daily with breakfast.   Yes Historical Provider, MD  metFORMIN (GLUCOPHAGE) 1000 MG tablet Take 1,000 mg by mouth 2 (two) times  daily with a meal.   Yes Historical Provider, MD  Multiple Vitamin (MULTIVITAMIN WITH MINERALS) TABS tablet Take 1 tablet by mouth daily.   Yes Historical Provider, MD  saxagliptin HCl (ONGLYZA) 5 MG TABS tablet Take 5 mg by mouth daily.   Yes Historical Provider, MD  simvastatin (ZOCOR) 20 MG tablet Take 20 mg by mouth daily.    Yes Historical Provider, MD  solifenacin (VESICARE) 5 MG tablet Take 5 mg by mouth daily.    Yes Historical Provider, MD  valsartan-hydrochlorothiazide (DIOVAN-HCT) 320-12.5 MG per tablet Take 1 tablet by mouth daily.   Yes Historical Provider, MD  HYDROcodone-acetaminophen (NORCO) 7.5-325 MG per tablet Take 1 tablet by mouth every 4 (four) hours as needed. 12/20/13   Kathie Dike, PA-C   BP 145/89  Pulse 100  Temp(Src) 98.6 F (37 C) (Oral)  Resp 18  Ht 5\' 2"  (1.575 m)  Wt 213 lb 2 oz (96.673 kg)  BMI 38.97 kg/m2  SpO2 97% Physical Exam  Nursing note and vitals reviewed. Constitutional: She is oriented to person, place, and time. She appears well-developed and well-nourished.  Non-toxic appearance.  HENT:  Head: Normocephalic.  Right Ear: Tympanic membrane and external ear normal.  Left Ear: Tympanic membrane and external ear normal.  Eyes: EOM and lids are normal. Pupils are equal, round, and reactive to light.  Neck: Normal range of motion. Neck supple. Carotid bruit is not present.  Cardiovascular: Regular rhythm, normal heart sounds, intact distal pulses and normal pulses.  Tachycardia present.   Pulmonary/Chest: Breath sounds normal. No respiratory distress.  Chaperone present during the examination. The patient has a mass beneath the nipple of the left breast. The mass extends beyond the areolae. There is increased redness and pain of the areolae area with some redness extending toward the tail of the breast. There is tenderness around the nipple area and the areolae. There is no drainage appreciated.  Abdominal: Soft. Bowel sounds are normal. There is  no tenderness. There is no guarding.  Musculoskeletal: Normal range of motion.  Lymphadenopathy:       Head (right side): No submandibular adenopathy present.       Head (left side): No submandibular adenopathy present.    She has no cervical adenopathy.  Neurological: She is alert and oriented to person, place, and time. She has normal strength. No cranial nerve deficit or sensory deficit.  Skin: Skin is warm and dry.  Psychiatric: She has a normal mood and affect. Her speech is normal.        ED Course  Procedures (including critical care time) Labs Review Labs Reviewed  CBC WITH DIFFERENTIAL  BASIC METABOLIC PANEL  LACTIC ACID, PLASMA    Imaging Review No results found.   EKG Interpretation None      MDM Temperature  is 98.6, pulse rate 100, respiratory rate 18, blood pressure 145/89. Pulse oximetry is 97% on room air. Within normal limits by my interpretation. CBG is elevated at 177. Complete blood count is well within normal limits. Basic metabolic panel shows a glucose to be elevated at 198, otherwise normal. Lactic acid is normal at 2.0. Case reviewed by Dr. Effie ShyWentz. Case discussed with Dr. Sharl MaLama with [facility] hospital[facility]. He will admit the patient.    Final diagnoses:  None    *I have reviewed nursing notes, vital signs, and all appropriate lab and imaging results for this patient.Kathie Dike**    Torben Soloway M Elayna Tobler, PA-C 01/01/14 [phone]

## 2013-12-28 NOTE — ED Notes (Signed)
Pain lt breast, pt says "infected"   Seen here 7/30 and is supposed to to have U/S of breast 8/22  Very tender

## 2013-12-29 ENCOUNTER — Encounter (HOSPITAL_COMMUNITY): Payer: Self-pay | Admitting: *Deleted

## 2013-12-29 DIAGNOSIS — Z8249 Family history of ischemic heart disease and other diseases of the circulatory system: Secondary | ICD-10-CM | POA: Diagnosis not present

## 2013-12-29 DIAGNOSIS — Z8349 Family history of other endocrine, nutritional and metabolic diseases: Secondary | ICD-10-CM | POA: Diagnosis not present

## 2013-12-29 DIAGNOSIS — N644 Mastodynia: Secondary | ICD-10-CM | POA: Diagnosis present

## 2013-12-29 DIAGNOSIS — E139 Other specified diabetes mellitus without complications: Secondary | ICD-10-CM

## 2013-12-29 DIAGNOSIS — F17200 Nicotine dependence, unspecified, uncomplicated: Secondary | ICD-10-CM | POA: Diagnosis present

## 2013-12-29 DIAGNOSIS — E7800 Pure hypercholesterolemia, unspecified: Secondary | ICD-10-CM | POA: Diagnosis present

## 2013-12-29 DIAGNOSIS — E119 Type 2 diabetes mellitus without complications: Secondary | ICD-10-CM | POA: Diagnosis present

## 2013-12-29 DIAGNOSIS — Z79899 Other long term (current) drug therapy: Secondary | ICD-10-CM | POA: Diagnosis not present

## 2013-12-29 DIAGNOSIS — N61 Mastitis without abscess: Secondary | ICD-10-CM

## 2013-12-29 DIAGNOSIS — G4730 Sleep apnea, unspecified: Secondary | ICD-10-CM | POA: Diagnosis present

## 2013-12-29 DIAGNOSIS — I10 Essential (primary) hypertension: Secondary | ICD-10-CM | POA: Diagnosis present

## 2013-12-29 DIAGNOSIS — M129 Arthropathy, unspecified: Secondary | ICD-10-CM | POA: Diagnosis present

## 2013-12-29 DIAGNOSIS — Z823 Family history of stroke: Secondary | ICD-10-CM | POA: Diagnosis not present

## 2013-12-29 DIAGNOSIS — E785 Hyperlipidemia, unspecified: Secondary | ICD-10-CM | POA: Diagnosis present

## 2013-12-29 DIAGNOSIS — K219 Gastro-esophageal reflux disease without esophagitis: Secondary | ICD-10-CM | POA: Diagnosis present

## 2013-12-29 LAB — CBC
HCT: 38 % (ref 36.0–46.0)
Hemoglobin: 12.8 g/dL (ref 12.0–15.0)
MCH: 31.1 pg (ref 26.0–34.0)
MCHC: 33.7 g/dL (ref 30.0–36.0)
MCV: 92.5 fL (ref 78.0–100.0)
Platelets: 249 10*3/uL (ref 150–400)
RBC: 4.11 MIL/uL (ref 3.87–5.11)
RDW: 14.1 % (ref 11.5–15.5)
WBC: 8.3 10*3/uL (ref 4.0–10.5)

## 2013-12-29 LAB — COMPREHENSIVE METABOLIC PANEL
ALT: 11 U/L (ref 0–35)
AST: 11 U/L (ref 0–37)
Albumin: 3 g/dL — ABNORMAL LOW (ref 3.5–5.2)
Alkaline Phosphatase: 124 U/L — ABNORMAL HIGH (ref 39–117)
Anion gap: 12 (ref 5–15)
BUN: 15 mg/dL (ref 6–23)
CO2: 28 mEq/L (ref 19–32)
Calcium: 8.9 mg/dL (ref 8.4–10.5)
Chloride: 101 mEq/L (ref 96–112)
Creatinine, Ser: 0.48 mg/dL — ABNORMAL LOW (ref 0.50–1.10)
GFR calc Af Amer: >90 mL/min (ref >90)
GFR calc non Af Amer: >90 mL/min (ref >90)
Glucose, Bld: 209 mg/dL — ABNORMAL HIGH (ref 70–99)
Potassium: 3.9 mEq/L (ref 3.7–5.3)
Sodium: 141 mEq/L (ref 137–147)
Total Bilirubin: 0.2 mg/dL — ABNORMAL LOW (ref 0.3–1.2)
Total Protein: 6.5 g/dL (ref 6.0–8.3)

## 2013-12-29 LAB — GLUCOSE, CAPILLARY
Glucose-Capillary: 197 mg/dL — ABNORMAL HIGH (ref 70–99)
Glucose-Capillary: 208 mg/dL — ABNORMAL HIGH (ref 70–99)
Glucose-Capillary: 122 mg/dL — ABNORMAL HIGH (ref 70–99)
Glucose-Capillary: 193 mg/dL — ABNORMAL HIGH (ref 70–99)

## 2013-12-29 MED ORDER — VANCOMYCIN HCL IN DEXTROSE 1-5 GM/200ML-% IV SOLN
1000.0000 mg | Freq: Once | INTRAVENOUS | Status: AC
  Administered 2013-12-29: 1000 mg via INTRAVENOUS
  Filled 2013-12-29: qty 200

## 2013-12-29 MED ORDER — CLONIDINE HCL 0.1 MG PO TABS
0.1000 mg | ORAL_TABLET | Freq: Two times a day (BID) | ORAL | Status: DC
  Administered 2013-12-29 – 2013-12-30 (×3): 0.1 mg via ORAL
  Filled 2013-12-29 (×3): qty 1

## 2013-12-29 MED ORDER — SIMVASTATIN 20 MG PO TABS
20.0000 mg | ORAL_TABLET | Freq: Every day | ORAL | Status: DC
  Administered 2013-12-29: 20 mg via ORAL
  Filled 2013-12-29: qty 1

## 2013-12-29 MED ORDER — ONDANSETRON HCL 4 MG/2ML IJ SOLN
4.0000 mg | Freq: Four times a day (QID) | INTRAMUSCULAR | Status: DC | PRN
  Administered 2013-12-29: 4 mg via INTRAVENOUS
  Filled 2013-12-29: qty 2

## 2013-12-29 MED ORDER — ACETAMINOPHEN 650 MG RE SUPP: 650.0000 mg | Freq: Four times a day (QID) | RECTAL | Status: DC | PRN

## 2013-12-29 MED ORDER — VALSARTAN-HYDROCHLOROTHIAZIDE 320-12.5 MG PO TABS: 1.0000 | ORAL_TABLET | Freq: Every day | ORAL | Status: DC

## 2013-12-29 MED ORDER — ACETAMINOPHEN 325 MG PO TABS: 650.0000 mg | ORAL_TABLET | Freq: Four times a day (QID) | ORAL | Status: DC | PRN

## 2013-12-29 MED ORDER — SODIUM CHLORIDE 0.9 % IV SOLN
INTRAVENOUS | Status: DC
  Administered 2013-12-29: 01:00:00 via INTRAVENOUS

## 2013-12-29 MED ORDER — OXYCODONE HCL 5 MG PO TABS
5.0000 mg | ORAL_TABLET | ORAL | Status: DC | PRN
  Administered 2013-12-29 (×2): 5 mg via ORAL
  Filled 2013-12-29 (×2): qty 1

## 2013-12-29 MED ORDER — IRBESARTAN 300 MG PO TABS
300.0000 mg | ORAL_TABLET | Freq: Every day | ORAL | Status: DC
  Administered 2013-12-29 – 2013-12-30 (×2): 300 mg via ORAL
  Filled 2013-12-29 (×2): qty 1

## 2013-12-29 MED ORDER — INSULIN ASPART 100 UNIT/ML ~~LOC~~ SOLN
0.0000 [IU] | Freq: Three times a day (TID) | SUBCUTANEOUS | Status: DC
  Administered 2013-12-29: 3 [IU] via SUBCUTANEOUS
  Administered 2013-12-29: 2 [IU] via SUBCUTANEOUS
  Administered 2013-12-29: 1 [IU] via SUBCUTANEOUS
  Administered 2013-12-30: 2 [IU] via SUBCUTANEOUS

## 2013-12-29 MED ORDER — VANCOMYCIN HCL IN DEXTROSE 1-5 GM/200ML-% IV SOLN
1000.0000 mg | Freq: Two times a day (BID) | INTRAVENOUS | Status: DC
  Administered 2013-12-29 – 2013-12-30 (×3): 1000 mg via INTRAVENOUS
  Filled 2013-12-29 (×5): qty 200

## 2013-12-29 MED ORDER — DARIFENACIN HYDROBROMIDE ER 7.5 MG PO TB24
7.5000 mg | ORAL_TABLET | Freq: Every day | ORAL | Status: DC
Start: 2013-12-29 — End: 2013-12-30
  Administered 2013-12-29 – 2013-12-30 (×2): 7.5 mg via ORAL
  Filled 2013-12-29 (×2): qty 1

## 2013-12-29 MED ORDER — ONDANSETRON HCL 4 MG PO TABS: 4.0000 mg | ORAL_TABLET | Freq: Four times a day (QID) | ORAL | Status: DC | PRN

## 2013-12-29 MED ORDER — ENOXAPARIN SODIUM 40 MG/0.4ML ~~LOC~~ SOLN
40.0000 mg | SUBCUTANEOUS | Status: DC
  Administered 2013-12-29 – 2013-12-30 (×2): 40 mg via SUBCUTANEOUS
  Filled 2013-12-29 (×2): qty 0.4

## 2013-12-29 MED ORDER — FLUOXETINE HCL 20 MG PO CAPS
40.0000 mg | ORAL_CAPSULE | Freq: Every day | ORAL | Status: DC
  Administered 2013-12-29 – 2013-12-30 (×2): 40 mg via ORAL
  Filled 2013-12-29 (×2): qty 2

## 2013-12-29 MED ORDER — CYCLOBENZAPRINE HCL 10 MG PO TABS
10.0000 mg | ORAL_TABLET | Freq: Every day | ORAL | Status: DC
  Administered 2013-12-29 – 2013-12-30 (×2): 10 mg via ORAL
  Filled 2013-12-29 (×2): qty 1

## 2013-12-29 MED ORDER — AMLODIPINE BESYLATE 5 MG PO TABS
5.0000 mg | ORAL_TABLET | Freq: Every day | ORAL | Status: DC
  Administered 2013-12-29 – 2013-12-30 (×2): 5 mg via ORAL
  Filled 2013-12-29 (×2): qty 1

## 2013-12-29 MED ORDER — BUPROPION HCL ER (XL) 150 MG PO TB24
150.0000 mg | ORAL_TABLET | Freq: Every day | ORAL | Status: DC
  Administered 2013-12-29 – 2013-12-30 (×2): 150 mg via ORAL
  Filled 2013-12-29 (×3): qty 1

## 2013-12-29 MED ORDER — HYDROCHLOROTHIAZIDE 12.5 MG PO CAPS
12.5000 mg | ORAL_CAPSULE | Freq: Every day | ORAL | Status: DC
  Administered 2013-12-29 – 2013-12-30 (×2): 12.5 mg via ORAL
  Filled 2013-12-29 (×2): qty 1

## 2013-12-29 NOTE — ED Notes (Signed)
vanc infusion started.

## 2013-12-29 NOTE — Consult Note (Signed)
Reason for Consult:mastitis left Referring Physician:Hawkins MD  Sarah Charles is an 53 y.o. female.  HPI: Asked to see pt at the request of Dr Luan Pulling for cellulitis/ mastitis left breast.  5 day hx of left breast pain and redness.  No drainage or fluctuance.  Hx of left breast abscess 1 year ago Drained by Dr Arnoldo Morale.  Admitted last night for IV ABX.  Been on PO abx since Monday.  UTD on mammogram.  No fever or chills.   No hx of breast radiation.   Past Medical History  Diagnosis Date  . Diabetes mellitus   . Arthritis   . Hypertension   . Hyperlipidemia   . GERD (gastroesophageal reflux disease)   . Sleep apnea     pt sts does not use "cause i dont think I need it anymore".  . Depression   . Hypercholesterolemia     Past Surgical History  Procedure Laterality Date  . Cholecystectomy    . Incision and drainage abscess Left 12/22/2012    Procedure: INCISION AND DRAINAGE ABSCESS;  Surgeon: Jamesetta So, MD;  Location: [facility];  Service: General;  Laterality: Left;    Family History  Problem Relation Age of Onset  . Hypertension Mother   . CVA Mother   . CAD Father   . Hypercholesterolemia Father     Social History:  reports that she has been smoking Cigarettes.  She has a 20 pack-year smoking history. She does not have any smokeless tobacco history on file. She reports that she does not drink alcohol or use illicit drugs.  Allergies:  Allergies  Allergen Reactions  . Cymbalta [Duloxetine Hcl] Nausea Only    Nausea, lack of therapeutic effect    Medications: I have reviewed the patient's current medications.  Results for orders placed during the hospital encounter of 12/28/13 (from the past 48 hour(s))  CBC WITH DIFFERENTIAL     Status: None   Collection Time    12/28/13 10:40 PM      Result Value Ref Range   WBC 9.9  4.0 - 10.5 K/uL   RBC 4.28  3.87 - 5.11 MIL/uL   Hemoglobin 13.3  12.0 - 15.0 g/dL   HCT 38.8  36.0 - 46.0 %   MCV 90.7  78.0 - 100.0 fL   MCH 31.1   26.0 - 34.0 pg   MCHC 34.3  30.0 - 36.0 g/dL   RDW 13.9  11.5 - 15.5 %   Platelets 248  150 - 400 K/uL   Neutrophils Relative % 65  43 - 77 %   Neutro Abs 6.4  1.7 - 7.7 K/uL   Lymphocytes Relative 27  12 - 46 %   Lymphs Abs 2.7  0.7 - 4.0 K/uL   Monocytes Relative 6  3 - 12 %   Monocytes Absolute 0.6  0.1 - 1.0 K/uL   Eosinophils Relative 2  0 - 5 %   Eosinophils Absolute 0.2  0.0 - 0.7 K/uL   Basophils Relative 0  0 - 1 %   Basophils Absolute 0.0  0.0 - 0.1 K/uL  BASIC METABOLIC PANEL     Status: Abnormal   Collection Time    12/28/13 10:40 PM      Result Value Ref Range   Sodium 139  137 - 147 mEq/L   Potassium 3.8  3.7 - 5.3 mEq/L   Chloride 98  96 - 112 mEq/L   CO2 27  19 - 32 mEq/L  Glucose, Bld 198 (*) 70 - 99 mg/dL   BUN 18  6 - 23 mg/dL   Creatinine, Ser 0.51  0.50 - 1.10 mg/dL   Calcium 9.3  8.4 - 10.5 mg/dL   GFR calc non Af Amer >90  >90 mL/min   GFR calc Af Amer >90  >90 mL/min   Comment: (NOTE)     The eGFR has been calculated using the CKD EPI equation.     This calculation has not been validated in all clinical situations.     eGFR's persistently <90 mL/min signify possible Chronic Kidney     Disease.   Anion gap 14  5 - 15  LACTIC ACID, PLASMA     Status: None   Collection Time    12/28/13 10:40 PM      Result Value Ref Range   Lactic Acid, Venous 2.0  0.5 - 2.2 mmol/L  CBG MONITORING, ED     Status: Abnormal   Collection Time    12/28/13 10:56 PM      Result Value Ref Range   Glucose-Capillary 177 (*) 70 - 99 mg/dL  CBC     Status: None   Collection Time    12/29/13  5:52 AM      Result Value Ref Range   WBC 8.3  4.0 - 10.5 K/uL   RBC 4.11  3.87 - 5.11 MIL/uL   Hemoglobin 12.8  12.0 - 15.0 g/dL   HCT 38.0  36.0 - 46.0 %   MCV 92.5  78.0 - 100.0 fL   MCH 31.1  26.0 - 34.0 pg   MCHC 33.7  30.0 - 36.0 g/dL   RDW 14.1  11.5 - 15.5 %   Platelets 249  150 - 400 K/uL  COMPREHENSIVE METABOLIC PANEL     Status: Abnormal   Collection Time     12/29/13  5:52 AM      Result Value Ref Range   Sodium 141  137 - 147 mEq/L   Potassium 3.9  3.7 - 5.3 mEq/L   Chloride 101  96 - 112 mEq/L   CO2 28  19 - 32 mEq/L   Glucose, Bld 209 (*) 70 - 99 mg/dL   BUN 15  6 - 23 mg/dL   Creatinine, Ser 0.48 (*) 0.50 - 1.10 mg/dL   Calcium 8.9  8.4 - 10.5 mg/dL   Total Protein 6.5  6.0 - 8.3 g/dL   Albumin 3.0 (*) 3.5 - 5.2 g/dL   AST 11  0 - 37 U/L   ALT 11  0 - 35 U/L   Alkaline Phosphatase 124 (*) 39 - 117 U/L   Total Bilirubin 0.2 (*) 0.3 - 1.2 mg/dL   GFR calc non Af Amer >90  >90 mL/min   GFR calc Af Amer >90  >90 mL/min   Comment: (NOTE)     The eGFR has been calculated using the CKD EPI equation.     This calculation has not been validated in all clinical situations.     eGFR's persistently <90 mL/min signify possible Chronic Kidney     Disease.   Anion gap 12  5 - 15  GLUCOSE, CAPILLARY     Status: Abnormal   Collection Time    12/29/13  7:42 AM      Result Value Ref Range   Glucose-Capillary 197 (*) 70 - 99 mg/dL  GLUCOSE, CAPILLARY     Status: Abnormal   Collection Time    12/29/13 11:34 AM  Result Value Ref Range   Glucose-Capillary 208 (*) 70 - 99 mg/dL    No results found.  Review of Systems  Constitutional: Negative for fever and chills.  HENT: Negative.   Respiratory: Negative.   Musculoskeletal: Negative for myalgias.  Skin: Negative.   Psychiatric/Behavioral: Negative.    Blood pressure 160/82, pulse 78, temperature 97.5 F (36.4 C), temperature source Oral, resp. rate 18, height _0  (1.575 m), weight 212 lb 15.4 oz (96.6 kg), SpO2 98.00%. Physical Exam  Constitutional: She is oriented to person, place, and time. She appears well-developed and well-nourished.  HENT:  Head: Normocephalic.  Eyes: Pupils are equal, round, and reactive to light. No scleral icterus.  Respiratory:    Musculoskeletal: Normal range of motion.  Neurological: She is alert and oriented to person, place, and time.    Psychiatric: She has a normal mood and affect. Her behavior is normal. Judgment and thought content normal.    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  No immediate need for surgical intervention at this point. If condition worsens can transfer to [facility] for further evaluation or treatment  Sarah Charles A. 12/29/2013, 1:55 PM

## 2013-12-29 NOTE — Progress Notes (Signed)
Subjective: She was admitted last night for cellulitis and what seems to be an abscess of her breast. She says she still having pain. She was in the emergency department earlier this week with the same problem  Objective: Vital signs in last 24 hours: Temp:  [97.5 F (36.4 C)-98.6 F (37 C)] 97.5 F (36.4 C) (08/08 0421) Pulse Rate:  [74-100] 78 (08/08 0421) Resp:  [18] 18 (08/08 0421) BP: (120-160)/(59-91) 160/82 mmHg (08/08 0859) SpO2:  [96 %-98 %] 98 % (08/08 0421) Weight:  [96.6 kg (212 lb 15.4 oz)-96.673 kg (213 lb 2 oz)] 96.6 kg (212 lb 15.4 oz) (08/08 0053) Weight change:  Last BM Date: 12/28/13  Intake/Output from previous day: 08/07 0701 - 08/08 0700 In: 360 [P.O.:360] Out: -   PHYSICAL EXAM General appearance: alert, cooperative and mild distress Her left breast shows evidence of cellulitis and she has what feels like a mass beneath the nipple it is tender. There is no drainage. Her chest is clear. Her heart is regular Lab Results:  Results for orders placed during the hospital encounter of 12/28/13 (from the past 48 hour(s))  CBC WITH DIFFERENTIAL     Status: None   Collection Time    12/28/13 10:40 PM      Result Value Ref Range   WBC 9.9  4.0 - 10.5 K/uL   RBC 4.28  3.87 - 5.11 MIL/uL   Hemoglobin 13.3  12.0 - 15.0 g/dL   HCT 38.8  36.0 - 46.0 %   MCV 90.7  78.0 - 100.0 fL   MCH 31.1  26.0 - 34.0 pg   MCHC 34.3  30.0 - 36.0 g/dL   RDW 13.9  11.5 - 15.5 %   Platelets 248  150 - 400 K/uL   Neutrophils Relative % 65  43 - 77 %   Neutro Abs 6.4  1.7 - 7.7 K/uL   Lymphocytes Relative 27  12 - 46 %   Lymphs Abs 2.7  0.7 - 4.0 K/uL   Monocytes Relative 6  3 - 12 %   Monocytes Absolute 0.6  0.1 - 1.0 K/uL   Eosinophils Relative 2  0 - 5 %   Eosinophils Absolute 0.2  0.0 - 0.7 K/uL   Basophils Relative 0  0 - 1 %   Basophils Absolute 0.0  0.0 - 0.1 K/uL  BASIC METABOLIC PANEL     Status: Abnormal   Collection Time    12/28/13 10:40 PM      Result Value Ref  Range   Sodium 139  137 - 147 mEq/L   Potassium 3.8  3.7 - 5.3 mEq/L   Chloride 98  96 - 112 mEq/L   CO2 27  19 - 32 mEq/L   Glucose, Bld 198 (*) 70 - 99 mg/dL   BUN 18  6 - 23 mg/dL   Creatinine, Ser 0.51  0.50 - 1.10 mg/dL   Calcium 9.3  8.4 - 10.5 mg/dL   GFR calc non Af Amer >90  >90 mL/min   GFR calc Af Amer >90  >90 mL/min   Comment: (NOTE)     The eGFR has been calculated using the CKD EPI equation.     This calculation has not been validated in all clinical situations.     eGFR's persistently <90 mL/min signify possible Chronic Kidney     Disease.   Anion gap 14  5 - 15  LACTIC ACID, PLASMA     Status: None   Collection  Time    12/28/13 10:40 PM      Result Value Ref Range   Lactic Acid, Venous 2.0  0.5 - 2.2 mmol/L  CBG MONITORING, ED     Status: Abnormal   Collection Time    12/28/13 10:56 PM      Result Value Ref Range   Glucose-Capillary 177 (*) 70 - 99 mg/dL  CBC     Status: None   Collection Time    12/29/13  5:52 AM      Result Value Ref Range   WBC 8.3  4.0 - 10.5 K/uL   RBC 4.11  3.87 - 5.11 MIL/uL   Hemoglobin 12.8  12.0 - 15.0 g/dL   HCT 38.0  36.0 - 46.0 %   MCV 92.5  78.0 - 100.0 fL   MCH 31.1  26.0 - 34.0 pg   MCHC 33.7  30.0 - 36.0 g/dL   RDW 14.1  11.5 - 15.5 %   Platelets 249  150 - 400 K/uL  COMPREHENSIVE METABOLIC PANEL     Status: Abnormal   Collection Time    12/29/13  5:52 AM      Result Value Ref Range   Sodium 141  137 - 147 mEq/L   Potassium 3.9  3.7 - 5.3 mEq/L   Chloride 101  96 - 112 mEq/L   CO2 28  19 - 32 mEq/L   Glucose, Bld 209 (*) 70 - 99 mg/dL   BUN 15  6 - 23 mg/dL   Creatinine, Ser 0.48 (*) 0.50 - 1.10 mg/dL   Calcium 8.9  8.4 - 10.5 mg/dL   Total Protein 6.5  6.0 - 8.3 g/dL   Albumin 3.0 (*) 3.5 - 5.2 g/dL   AST 11  0 - 37 U/L   ALT 11  0 - 35 U/L   Alkaline Phosphatase 124 (*) 39 - 117 U/L   Total Bilirubin 0.2 (*) 0.3 - 1.2 mg/dL   GFR calc non Af Amer >90  >90 mL/min   GFR calc Af Amer >90  >90 mL/min    Comment: (NOTE)     The eGFR has been calculated using the CKD EPI equation.     This calculation has not been validated in all clinical situations.     eGFR's persistently <90 mL/min signify possible Chronic Kidney     Disease.   Anion gap 12  5 - 15  GLUCOSE, CAPILLARY     Status: Abnormal   Collection Time    12/29/13  7:42 AM      Result Value Ref Range   Glucose-Capillary 197 (*) 70 - 99 mg/dL    ABGS No results found for this basename: PHART, PCO2, PO2ART, TCO2, HCO3,  in the last 72 hours CULTURES No results found for this or any previous visit (from the past 240 hour(s)). Studies/Results: No results found.  Medications:  Prior to Admission:  Prescriptions prior to admission  Medication Sig Dispense Refill  . amLODipine (NORVASC) 5 MG tablet Take 5 mg by mouth daily.      Marland Kitchen buPROPion (WELLBUTRIN XL) 150 MG 24 hr tablet Take 150 mg by mouth daily.      . cloNIDine (CATAPRES) 0.1 MG tablet Take 0.1 mg by mouth 2 (two) times daily.      . cyclobenzaprine (FLEXERIL) 10 MG tablet Take 10 mg by mouth daily.       Marland Kitchen FLUoxetine (PROZAC) 40 MG capsule Take 40 mg by mouth daily.      Marland Kitchen  glimepiride (AMARYL) 4 MG tablet Take 4 mg by mouth daily with breakfast.      . metFORMIN (GLUCOPHAGE) 1000 MG tablet Take 1,000 mg by mouth 2 (two) times daily with a meal.      . Multiple Vitamin (MULTIVITAMIN WITH MINERALS) TABS tablet Take 1 tablet by mouth daily.      . saxagliptin HCl (ONGLYZA) 5 MG TABS tablet Take 5 mg by mouth daily.      . simvastatin (ZOCOR) 20 MG tablet Take 20 mg by mouth daily.       . solifenacin (VESICARE) 5 MG tablet Take 5 mg by mouth daily.       . valsartan-hydrochlorothiazide (DIOVAN-HCT) 320-12.5 MG per tablet Take 1 tablet by mouth daily.      Marland Kitchen HYDROcodone-acetaminophen (NORCO) 7.5-325 MG per tablet Take 1 tablet by mouth every 4 (four) hours as needed.  20 tablet  0   Scheduled: . amLODipine  5 mg Oral Daily  . buPROPion  150 mg Oral Daily  . cloNIDine   0.1 mg Oral BID  . cyclobenzaprine  10 mg Oral Daily  . darifenacin  7.5 mg Oral Daily  . enoxaparin (LOVENOX) injection  40 mg Subcutaneous Q24H  . FLUoxetine  40 mg Oral Daily  . irbesartan  300 mg Oral Daily   And  . hydrochlorothiazide  12.5 mg Oral Daily  . insulin aspart  0-9 Units Subcutaneous TID WC  . simvastatin  20 mg Oral QPC supper  . vancomycin  1,000 mg Intravenous Q12H   Continuous: . sodium chloride 75 mL/hr at 12/29/13 0100   TJX:[id number], acetaminophen, ondansetron (ZOFRAN) IV, ondansetron, oxyCODONE  Assesment: She has cellulitis of her left breast and may have an abscess. I discussed her situation with Dr. Brantley Stage, general surgeon and he will see her later today Principal Problem:   Cellulitis of breast Active Problems:   HYPERTENSION, BENIGN ESSENTIAL   Diabetes mellitus   Cellulitis of female breast    Plan: General surgery consultation    LOS: 1 day   Lyndal Reggio L 12/29/2013, 9:44 AM

## 2013-12-29 NOTE — Progress Notes (Signed)
Utilization review Completed Letricia Krinsky RN BSN   

## 2013-12-29 NOTE — H&P (Signed)
PCP:   FANTA,TESFAYE, MD   Chief Complaint:  Left breast pain and redness  HPI:  53 year old female who  has a past medical history of Diabetes mellitus; Arthritis; Hypertension; Hyperlipidemia; GERD (gastroesophageal reflux disease); Sleep apnea; Depression; and Hypercholesterolemia. Today presents to the ED with worsening left breast pain and redness. Patient was seen in the ED on 12/20/2013 at that time patient was discharged on doxycycline for possible abscess, but patient could not take the doxycycline as she misplaced the prescription.Arrangements were made for the patient to have ultrasound, but it was determined by radiology that she would need to have a mammogram first and then determine the need for ultrasound. This could not be scheduled until August 11.  Today she came to the ED as she accidentally bumped the left breast while taking a shower and noted a severe pain she also noted increased redness in the breast area. The pain she describes as Oki, 7/10 in intensity. She has a previous history of abscess of the breast which was drained on 12/22/2012. She denies fever, no nausea vomiting or diarrhea. No chest or shortness of breath.  Allergies:   Allergies  Allergen Reactions  . Cymbalta [Duloxetine Hcl] Nausea Only    Nausea, lack of therapeutic effect      Past Medical History  Diagnosis Date  . Diabetes mellitus   . Arthritis   . Hypertension   . Hyperlipidemia   . GERD (gastroesophageal reflux disease)   . Sleep apnea     pt sts does not use "cause i dont think I need it anymore".  . Depression   . Hypercholesterolemia     Past Surgical History  Procedure Laterality Date  . Cholecystectomy    . Incision and drainage abscess Left 12/22/2012    Procedure: INCISION AND DRAINAGE ABSCESS;  Surgeon: Dalia HeadingMark A Jenkins, MD;  Location: [facility];  Service: General;  Laterality: Left;    Prior to Admission medications   Medication Sig Start Date End Date Taking?  Authorizing Provider  amLODipine (NORVASC) 5 MG tablet Take 5 mg by mouth daily.   Yes Historical Provider, MD  buPROPion (WELLBUTRIN XL) 150 MG 24 hr tablet Take 150 mg by mouth daily.   Yes Historical Provider, MD  cloNIDine (CATAPRES) 0.1 MG tablet Take 0.1 mg by mouth 2 (two) times daily.   Yes Historical Provider, MD  cyclobenzaprine (FLEXERIL) 10 MG tablet Take 10 mg by mouth daily.    Yes Historical Provider, MD  FLUoxetine (PROZAC) 40 MG capsule Take 40 mg by mouth daily.   Yes Historical Provider, MD  glimepiride (AMARYL) 4 MG tablet Take 4 mg by mouth daily with breakfast.   Yes Historical Provider, MD  metFORMIN (GLUCOPHAGE) 1000 MG tablet Take 1,000 mg by mouth 2 (two) times daily with a meal.   Yes Historical Provider, MD  Multiple Vitamin (MULTIVITAMIN WITH MINERALS) TABS tablet Take 1 tablet by mouth daily.   Yes Historical Provider, MD  saxagliptin HCl (ONGLYZA) 5 MG TABS tablet Take 5 mg by mouth daily.   Yes Historical Provider, MD  simvastatin (ZOCOR) 20 MG tablet Take 20 mg by mouth daily.    Yes Historical Provider, MD  solifenacin (VESICARE) 5 MG tablet Take 5 mg by mouth daily.    Yes Historical Provider, MD  valsartan-hydrochlorothiazide (DIOVAN-HCT) 320-12.5 MG per tablet Take 1 tablet by mouth daily.   Yes Historical Provider, MD  HYDROcodone-acetaminophen (NORCO) 7.5-325 MG per tablet Take 1 tablet by mouth every 4 (four)  hours as needed. 12/20/13   Kathie Dike, PA-C    Social History:  reports that she has been smoking Cigarettes.  She has a 20 pack-year smoking history. She does not have any smokeless tobacco history on file. She reports that she does not drink alcohol or use illicit drugs.  Family History  Problem Relation Age of Onset  . Hypertension Mother   . CVA Mother   . CAD Father   . Hypercholesterolemia Father      All the positives are listed in BOLD  Review of Systems:  HEENT: Headache, blurred vision, runny nose, sore throat Neck:  Hypothyroidism, hyperthyroidism,,lymphadenopathy Chest : Shortness of breath, history of COPD, Asthma Heart : Chest pain, history of coronary arterey disease GI:  Nausea, vomiting, diarrhea, constipation, GERD GU: Dysuria, urgency, frequency of urination, hematuria Neuro: Stroke, seizures, syncope Psych: Depression, anxiety, hallucinations   Physical Exam: Blood pressure 145/89, pulse 100, temperature 98.6 F (37 C), temperature source Oral, resp. rate 18, height 5\' 2"  (1.575 m), weight 96.673 kg (213 lb 2 oz), SpO2 97.00%. Constitutional:   Patient is a well-developed and well-nourished female* in no acute distress and cooperative with exam. Head: Normocephalic and atraumatic Mouth: Mucus membranes moist Eyes: PERRL, EOMI, conjunctivae normal Neck: Supple, No Thyromegaly Cardiovascular: RRR, S1 normal, S2 normal Pulmonary/Chest: CTAB, no wheezes, rales, or rhonchi Breast- examination of the left breast reveals erythema, mild tenderness to palpation in the areola Abdominal: Soft. Non-tender, non-distended, bowel sounds are normal, no masses, organomegaly, or guarding present.  Neurological: A&O x3, Strenght is normal and symmetric bilaterally, cranial nerve II-XII are grossly intact, no focal motor deficit, sensory intact to light touch bilaterally.  Extremities : No Cyanosis, Clubbing or Edema  Labs on Admission:  Basic Metabolic Panel:  Recent Labs Lab 12/28/13 2240  NA 139  K 3.8  CL 98  CO2 27  GLUCOSE 198*  BUN 18  CREATININE 0.51  CALCIUM 9.3    CBC:  Recent Labs Lab 12/28/13 2240  WBC 9.9  NEUTROABS 6.4  HGB 13.3  HCT 38.8  MCV 90.7  PLT 248   Cardiac Enzymes: No results found for this basename: CKTOTAL, CKMB, CKMBINDEX, TROPONINI,  in the last 168 hours  BNP (last 3 results) No results found for this basename: PROBNP,  in the last 8760 hours CBG:  Recent Labs Lab 12/28/13 2256  GLUCAP 177*    Radiological Exams on Admission: No results  found.     Assessment/Plan Principal Problem:   Cellulitis of breast Active Problems:   HYPERTENSION, BENIGN ESSENTIAL   Diabetes mellitus   Cellulitis of female breast  Left breast cellulitis Will admit the patient and start vancomycin per pharmacy consultation. Patient had a similar presentation last year when she required incision and drainage We'll consult general surgery in a.m.  Hypertension Continue home medications  Diabetes mellitus Will hold oral hypoglycemics, start sliding scale insulin with NovoLog.  DVT prophylaxis Lovenox  Code status: Presumed code  Family discussion: No family at bedside  Time Spent on Admission: 50 minutes  Courtnay Petrilla S [facility] Pager: [phone] 12/29/2013, 12:21 AM  If 7PM-7AM, please contact night-coverage  [email or web address]  Password [email or web address]

## 2013-12-29 NOTE — Progress Notes (Signed)
ANTIBIOTIC CONSULT NOTE - INITIAL  Pharmacy Consult for vancomycin Indication: cellulitis / possible abscess of left breast  Allergies  Allergen Reactions  . Cymbalta [Duloxetine Hcl] Nausea Only    Nausea, lack of therapeutic effect    Patient Measurements: Height: 5\' 2"  (157.5 cm) Weight: 212 lb 15.4 oz (96.6 kg) IBW/kg (Calculated) : 50.1 Adjusted Body Weight: 66 kg  Vital Signs: Temp: 98.2 F (36.8 C) (08/08 0053) Temp src: Oral (08/08 0053) BP: 120/59 mmHg (08/08 0053) Pulse Rate: 74 (08/08 0053) Intake/Output from previous day:   Intake/Output from this shift:    Labs:  Recent Labs  12/28/13 2240  WBC 9.9  HGB 13.3  PLT 248  CREATININE 0.51   Estimated Creatinine Clearance: 88.2 ml/min (by C-G formula based on Cr of 0.51).   Microbiology: No results found for this or any previous visit (from the past 720 hour(s)).  Medical History: Past Medical History  Diagnosis Date  . Diabetes mellitus   . Arthritis   . Hypertension   . Hyperlipidemia   . GERD (gastroesophageal reflux disease)   . Sleep apnea     pt sts does not use "cause i dont think I need it anymore".  . Depression   . Hypercholesterolemia     Medications:  Scheduled:  . amLODipine  5 mg Oral Daily  . buPROPion  150 mg Oral Daily  . cloNIDine  0.1 mg Oral BID  . cyclobenzaprine  10 mg Oral Daily  . darifenacin  7.5 mg Oral Daily  . enoxaparin (LOVENOX) injection  40 mg Subcutaneous Q24H  . FLUoxetine  40 mg Oral Daily  . irbesartan  300 mg Oral Daily   And  . hydrochlorothiazide  12.5 mg Oral Daily  . insulin aspart  0-9 Units Subcutaneous TID WC  . simvastatin  20 mg Oral QPC supper  . vancomycin  1,000 mg Intravenous Once  . vancomycin  1,000 mg Intravenous Q12H   Infusions:  . sodium chloride     PRN: acetaminophen, acetaminophen, ondansetron (ZOFRAN) IV, ondansetron, oxyCODONE  Assessment: [age]yr female with untreated left breast pain/redness/possible abscess from 7/30th  (never filled oral doxycycline prescription given to her in ER).  Now has had one dose 1gm vancomycin at 12mn tonight.  Goal of Therapy:  Desire vancomycin trough level 12-5415mcg/ml for cellulitis  Plan:  1. Start vancomycin 1gm IV q12h 2.  Monitor indices of infection and renal function 3.  Get actual measurement of steady state serum vancomycin trough level as clinically indicated  Scarlett PrestoRochette, Nivek Powley E 12/29/2013,1:08 AM

## 2013-12-30 LAB — GLUCOSE, CAPILLARY: Glucose-Capillary: 179 mg/dL — ABNORMAL HIGH (ref 70–99)

## 2013-12-30 MED ORDER — DOXYCYCLINE HYCLATE 50 MG PO CAPS: 100.0000 mg | ORAL_CAPSULE | Freq: Two times a day (BID) | ORAL | Status: DC

## 2013-12-30 NOTE — Discharge Summary (Signed)
Physician Discharge Summary  Patient ID: Sarah Charles MRN: [id number] DOB/AGE: 53/11/1960 53 y.o. Primary Care Physician:Sarah Charles Admit date: 12/28/2013 Discharge date: 12/30/2013    Discharge Diagnoses:   Principal Problem:   Cellulitis of breast Active Problems:   HYPERTENSION, BENIGN ESSENTIAL   Diabetes mellitus   Cellulitis of female breast     Medication List         amLODipine 5 MG tablet  Commonly known as:  NORVASC  Take 5 mg by mouth daily.     buPROPion 150 MG 24 hr tablet  Commonly known as:  WELLBUTRIN XL  Take 150 mg by mouth daily.     cloNIDine 0.1 MG tablet  Commonly known as:  CATAPRES  Take 0.1 mg by mouth 2 (two) times daily.     cyclobenzaprine 10 MG tablet  Commonly known as:  FLEXERIL  Take 10 mg by mouth daily.     doxycycline 50 MG capsule  Commonly known as:  VIBRAMYCIN  Take 2 capsules (100 mg total) by mouth 2 (two) times daily.     FLUoxetine 40 MG capsule  Commonly known as:  PROZAC  Take 40 mg by mouth daily.     glimepiride 4 MG tablet  Commonly known as:  AMARYL  Take 4 mg by mouth daily with breakfast.     HYDROcodone-acetaminophen 7.5-325 MG per tablet  Commonly known as:  NORCO  Take 1 tablet by mouth every 4 (four) hours as needed.     metFORMIN 1000 MG tablet  Commonly known as:  GLUCOPHAGE  Take 1,000 mg by mouth 2 (two) times daily with a meal.     multivitamin with minerals Tabs tablet  Take 1 tablet by mouth daily.     ONGLYZA 5 MG Tabs tablet  Generic drug:  saxagliptin HCl  Take 5 mg by mouth daily.     simvastatin 20 MG tablet  Commonly known as:  ZOCOR  Take 20 mg by mouth daily.     solifenacin 5 MG tablet  Commonly known as:  VESICARE  Take 5 mg by mouth daily.     valsartan-hydrochlorothiazide 320-12.5 MG per tablet  Commonly known as:  DIOVAN-HCT  Take 1 tablet by mouth daily.        Discharged Condition: Improved    Consults: Gen. surgery, Dr. Luisa Charles  Significant Diagnostic  Studies: No results found.  Lab Results: Basic Metabolic Panel:  Recent Labs  40/98/1106/12/06 2240 12/29/13 0552  NA 139 141  K 3.8 3.9  CL 98 101  CO2 27 28  GLUCOSE 198* 209*  BUN 18 15  CREATININE 0.51 0.48*  CALCIUM 9.3 8.9   Liver Function Tests:  Recent Labs  12/29/13 0552  AST 11  ALT 11  ALKPHOS 124*  BILITOT 0.2*  PROT 6.5  ALBUMIN 3.0*     CBC:  Recent Labs  12/28/13 2240 12/29/13 0552  WBC 9.9 8.3  NEUTROABS 6.4  --   HGB 13.3 12.8  HCT 38.8 38.0  MCV 90.7 92.5  PLT 248 249    No results found for this or any previous visit (from the past 240 hour(s)).   Hospital Course: This is a 53 year old who came to the emergency department complaining of pain and swelling of her breasts. She had been seen about a week ago for a similar problem and although she was sent a prescription for an antibiotic it was sent to a different drug stores and she uses and she never got the prescription.  She was found to have cellulitis of her breast. She was started on intravenous antibiotics and improved. She had surgery consultation and it was felt that she did not need surgery at this point and that this could be treated with antibiotics with followup with her regular general surgeon. Her breast pain had improved and she felt generally better by the time of discharge  Discharge Exam: Blood pressure 127/80, pulse 75, temperature 98.1 F (36.7 C), temperature source Oral, resp. rate 18, height 5\' 2"  (1.575 m), weight 96.6 kg (212 lb 15.4 oz), SpO2 94.00%. She is awake and alert. She still has swelling and tenderness of the left breast around the nipple with the mass which is felt to likely be a inflammatory mass with infection is smaller and less tender  Disposition: Home with antibiotics. She will call Sarah Charles who has previously done breast surgery on her for an appointment next week      Signed: Brittlyn Cloe Charles   12/30/2013, 8:24 AM

## 2013-12-30 NOTE — Progress Notes (Signed)
Dr. Rosezena Sensorornett's consult is noted and appreciated. She wants to go home. Apparently when she left the emergency department last time she was not given a prescription for an antibiotic and although one was called in she did not get the prescription filled.

## 2013-12-30 NOTE — Progress Notes (Signed)
Patient very anxious about going home.  Patient and husband are having emotional hardships at home with father -in-law.  They stated he is very sick and want to spend time with him before he passes.  Explained to patient that she needed to stay for further antibiotic therapy to help improve cellulitis.  Patient understands, but is still sad about being in hospital at this time.

## 2014-01-01 ENCOUNTER — Ambulatory Visit (HOSPITAL_COMMUNITY)
Admit: 2014-01-01 | Discharge: 2014-01-01 | Disposition: A | Discharge status: Home / Self Care | Payer: Medicaid Other | Attending: Emergency Medicine | Admitting: Emergency Medicine

## 2014-01-01 NOTE — ED Provider Notes (Signed)
Medical screening examination/treatment/procedure(s) were performed by non-physician practitioner and as supervising physician I was immediately available for consultation/collaboration.  Flint MelterElliott L Zeddie Njie, MD 01/01/14 [phone]

## 2014-01-04 ENCOUNTER — Other Ambulatory Visit (HOSPITAL_COMMUNITY): Payer: Self-pay | Admitting: Nurse Practitioner

## 2014-01-08 ENCOUNTER — Other Ambulatory Visit (HOSPITAL_COMMUNITY): Payer: Self-pay | Admitting: *Deleted

## 2014-01-08 ENCOUNTER — Ambulatory Visit (HOSPITAL_COMMUNITY)
Admission: RE | Admit: 2014-01-08 | Discharge: 2014-01-08 | Disposition: A | Discharge status: Home / Self Care | Payer: Medicaid Other | Source: Ambulatory Visit | Attending: *Deleted | Admitting: *Deleted

## 2014-01-08 ENCOUNTER — Ambulatory Visit (HOSPITAL_COMMUNITY)
Admission: RE | Admit: 2014-01-08 | Discharge: 2014-01-08 | Disposition: A | Discharge status: Home / Self Care | Payer: Medicaid Other | Source: Ambulatory Visit | Attending: Physician Assistant | Admitting: Physician Assistant

## 2014-01-08 ENCOUNTER — Other Ambulatory Visit (HOSPITAL_COMMUNITY): Payer: Self-pay | Admitting: Physician Assistant

## 2014-01-08 DIAGNOSIS — N6320 Unspecified lump in the left breast, unspecified quadrant: Secondary | ICD-10-CM

## 2014-01-08 DIAGNOSIS — N61 Mastitis without abscess: Secondary | ICD-10-CM | POA: Diagnosis not present

## 2014-01-08 MED ORDER — LIDOCAINE-EPINEPHRINE (PF) 2 %-1:200000 IJ SOLN
INTRAMUSCULAR | Status: AC
  Filled 2014-01-08: qty 20

## 2014-01-21 ENCOUNTER — Other Ambulatory Visit (HOSPITAL_COMMUNITY): Payer: Self-pay | Admitting: Nurse Practitioner

## 2014-01-21 DIAGNOSIS — N6320 Unspecified lump in the left breast, unspecified quadrant: Secondary | ICD-10-CM

## 2014-01-21 DIAGNOSIS — Z09 Encounter for follow-up examination after completed treatment for conditions other than malignant neoplasm: Secondary | ICD-10-CM

## 2014-01-22 ENCOUNTER — Ambulatory Visit (HOSPITAL_COMMUNITY)
Admission: RE | Admit: 2014-01-22 | Discharge: 2014-01-22 | Disposition: A | Discharge status: Home / Self Care | Payer: Medicaid Other | Source: Ambulatory Visit | Attending: Nurse Practitioner | Admitting: Nurse Practitioner

## 2014-01-22 DIAGNOSIS — Z09 Encounter for follow-up examination after completed treatment for conditions other than malignant neoplasm: Secondary | ICD-10-CM

## 2014-01-22 DIAGNOSIS — N63 Unspecified lump in unspecified breast: Secondary | ICD-10-CM | POA: Diagnosis not present

## 2014-01-22 DIAGNOSIS — N6320 Unspecified lump in the left breast, unspecified quadrant: Secondary | ICD-10-CM

## 2014-01-22 DIAGNOSIS — F17200 Nicotine dependence, unspecified, uncomplicated: Secondary | ICD-10-CM | POA: Insufficient documentation

## 2014-09-19 ENCOUNTER — Other Ambulatory Visit (HOSPITAL_COMMUNITY): Payer: Self-pay | Admitting: Physician Assistant

## 2014-09-19 DIAGNOSIS — Z1231 Encounter for screening mammogram for malignant neoplasm of breast: Secondary | ICD-10-CM

## 2014-10-07 ENCOUNTER — Ambulatory Visit (HOSPITAL_COMMUNITY)
Admission: RE | Admit: 2014-10-07 | Discharge: 2014-10-07 | Disposition: A | Discharge status: Home / Self Care | Payer: Medicaid Other | Source: Ambulatory Visit | Attending: Physician Assistant | Admitting: Physician Assistant

## 2014-10-07 DIAGNOSIS — Z1231 Encounter for screening mammogram for malignant neoplasm of breast: Secondary | ICD-10-CM

## 2014-11-02 ENCOUNTER — Encounter (HOSPITAL_COMMUNITY): Payer: Self-pay | Admitting: *Deleted

## 2014-11-02 ENCOUNTER — Emergency Department (HOSPITAL_COMMUNITY)
Admission: EM | Admit: 2014-11-02 | Discharge: 2014-11-03 | Disposition: A | Discharge status: Home / Self Care | Payer: Medicaid Other | Attending: Emergency Medicine | Admitting: Emergency Medicine

## 2014-11-02 DIAGNOSIS — M1990 Unspecified osteoarthritis, unspecified site: Secondary | ICD-10-CM | POA: Insufficient documentation

## 2014-11-02 DIAGNOSIS — Z720 Tobacco use: Secondary | ICD-10-CM | POA: Insufficient documentation

## 2014-11-02 DIAGNOSIS — R1111 Vomiting without nausea: Secondary | ICD-10-CM | POA: Insufficient documentation

## 2014-11-02 DIAGNOSIS — Z8659 Personal history of other mental and behavioral disorders: Secondary | ICD-10-CM | POA: Insufficient documentation

## 2014-11-02 DIAGNOSIS — E785 Hyperlipidemia, unspecified: Secondary | ICD-10-CM | POA: Insufficient documentation

## 2014-11-02 DIAGNOSIS — E119 Type 2 diabetes mellitus without complications: Secondary | ICD-10-CM | POA: Insufficient documentation

## 2014-11-02 DIAGNOSIS — E663 Overweight: Secondary | ICD-10-CM | POA: Insufficient documentation

## 2014-11-02 DIAGNOSIS — E780 Pure hypercholesterolemia: Secondary | ICD-10-CM | POA: Insufficient documentation

## 2014-11-02 DIAGNOSIS — Z79899 Other long term (current) drug therapy: Secondary | ICD-10-CM | POA: Insufficient documentation

## 2014-11-02 DIAGNOSIS — K219 Gastro-esophageal reflux disease without esophagitis: Secondary | ICD-10-CM | POA: Insufficient documentation

## 2014-11-02 DIAGNOSIS — R197 Diarrhea, unspecified: Secondary | ICD-10-CM | POA: Insufficient documentation

## 2014-11-02 DIAGNOSIS — I10 Essential (primary) hypertension: Secondary | ICD-10-CM | POA: Insufficient documentation

## 2014-11-02 DIAGNOSIS — Z8669 Personal history of other diseases of the nervous system and sense organs: Secondary | ICD-10-CM | POA: Insufficient documentation

## 2014-11-02 LAB — CBC WITH DIFFERENTIAL/PLATELET
Basophils Absolute: 0 10*3/uL (ref 0.0–0.1)
Basophils Relative: 0 % (ref 0–1)
Eosinophils Absolute: 0.2 10*3/uL (ref 0.0–0.7)
Eosinophils Relative: 2 % (ref 0–5)
HCT: 42.5 % (ref 36.0–46.0)
Hemoglobin: 14.3 g/dL (ref 12.0–15.0)
Lymphocytes Relative: 32 % (ref 12–46)
Lymphs Abs: 3.4 10*3/uL (ref 0.7–4.0)
MCH: 30.6 pg (ref 26.0–34.0)
MCHC: 33.6 g/dL (ref 30.0–36.0)
MCV: 91 fL (ref 78.0–100.0)
Monocytes Absolute: 0.6 10*3/uL (ref 0.1–1.0)
Monocytes Relative: 6 % (ref 3–12)
Neutro Abs: 6.2 10*3/uL (ref 1.7–7.7)
Neutrophils Relative %: 60 % (ref 43–77)
Platelets: 251 10*3/uL (ref 150–400)
RBC: 4.67 MIL/uL (ref 3.87–5.11)
RDW: 13.6 % (ref 11.5–15.5)
WBC: 10.4 10*3/uL (ref 4.0–10.5)

## 2014-11-02 LAB — COMPREHENSIVE METABOLIC PANEL
ALT: 15 U/L (ref 14–54)
AST: 14 U/L — ABNORMAL LOW (ref 15–41)
Albumin: 3.8 g/dL (ref 3.5–5.0)
Alkaline Phosphatase: 116 U/L (ref 38–126)
Anion gap: 11 (ref 5–15)
BUN: 19 mg/dL (ref 6–20)
CO2: 26 mmol/L (ref 22–32)
Calcium: 9.3 mg/dL (ref 8.9–10.3)
Chloride: 103 mmol/L (ref 101–111)
Creatinine, Ser: 0.5 mg/dL (ref 0.44–1.00)
GFR calc Af Amer: >60 mL/min (ref >60)
GFR calc non Af Amer: >60 mL/min (ref >60)
Glucose, Bld: 128 mg/dL — ABNORMAL HIGH (ref 65–99)
Potassium: 3.3 mmol/L — ABNORMAL LOW (ref 3.5–5.1)
Sodium: 140 mmol/L (ref 135–145)
Total Bilirubin: 0.5 mg/dL (ref 0.3–1.2)
Total Protein: 7.4 g/dL (ref 6.5–8.1)

## 2014-11-02 LAB — TROPONIN I: Troponin I: <0.03 ng/mL (ref <0.031)

## 2014-11-02 MED ORDER — SODIUM CHLORIDE 0.9 % IV SOLN
1000.0000 mL | Freq: Once | INTRAVENOUS | Status: AC
  Administered 2014-11-02: 1000 mL via INTRAVENOUS

## 2014-11-02 MED ORDER — PANTOPRAZOLE SODIUM 40 MG PO TBEC
40.0000 mg | DELAYED_RELEASE_TABLET | Freq: Once | ORAL | Status: AC
  Administered 2014-11-02: 40 mg via ORAL
  Filled 2014-11-02: qty 1

## 2014-11-02 MED ORDER — LOPERAMIDE HCL 2 MG PO CAPS
4.0000 mg | ORAL_CAPSULE | Freq: Once | ORAL | Status: AC
  Administered 2014-11-02: 4 mg via ORAL
  Filled 2014-11-02: qty 2

## 2014-11-02 NOTE — ED Provider Notes (Addendum)
CSN: [id number]     Arrival date & time 11/02/14  2038 History  This chart was scribed for Shon Baton, MD by Evon Slack, ED Scribe. This patient was seen in room [facility] and the patient's care was started at 11:00 PM.      Chief Complaint  Patient presents with  . Diarrhea    The history is provided by the patient. No language interpreter was used.   HPI Comments: Sarah Charles is a 54 y.o. female with PMHx DM, HTN and GERD who presents to the Emergency Department complaining of diarrhea onset 3 days prior. Pt states that she is having 4-6 episodes of diarrhea per day. Pt reports vomiting x 1 last night but she is relating this to her GERD after eating tacos. Pt states she has associated cramping abdominal pain as well. Pt has tried imodium with temporary relief. Denies fever. Pt denies any recent travel or recent antibiotic use.   Past Medical History  Diagnosis Date  . Diabetes mellitus   . Arthritis   . Hypertension   . Hyperlipidemia   . GERD (gastroesophageal reflux disease)   . Sleep apnea     pt sts does not use "cause i dont think I need it anymore".  . Depression   . Hypercholesterolemia    Past Surgical History  Procedure Laterality Date  . Cholecystectomy    . Incision and drainage abscess Left 12/22/2012    Procedure: INCISION AND DRAINAGE ABSCESS;  Surgeon: Dalia Heading, MD;  Location: [facility];  Service: General;  Laterality: Left;   Family History  Problem Relation Age of Onset  . Hypertension Mother   . CVA Mother   . CAD Father   . Hypercholesterolemia Father    History  Substance Use Topics  . Smoking status: Current Every Day Smoker -- 0.50 packs/day for 40 years    Types: Cigarettes  . Smokeless tobacco: Not on file  . Alcohol Use: No   OB History    No data available     Review of Systems  Constitutional: Negative for fever.  Respiratory: Negative for chest tightness and shortness of breath.   Cardiovascular: Negative for chest  pain.  Gastrointestinal: Positive for vomiting and diarrhea. Negative for nausea and abdominal pain.  Genitourinary: Negative for dysuria.  Musculoskeletal: Negative for back pain.  Neurological: Negative for headaches.  All other systems reviewed and are negative.     Allergies  Cymbalta  Home Medications   Prior to Admission medications   Medication Sig Start Date End Date Taking? Authorizing Provider  cloNIDine (CATAPRES) 0.1 MG tablet Take 0.1 mg by mouth 2 (two) times daily.   Yes Historical Provider, MD  CRESTOR 20 MG tablet Take 20 mg by mouth at bedtime. 09/19/14  Yes Historical Provider, MD  cyclobenzaprine (FLEXERIL) 10 MG tablet Take 10 mg by mouth daily.    Yes Historical Provider, MD  Fish Oil-Cholecalciferol (FISH OIL + D3 PO) Take 1 capsule by mouth daily.   Yes Historical Provider, MD  glipiZIDE (GLUCOTROL) 10 MG tablet Take 10 mg by mouth 2 (two) times daily. 09/05/14  Yes Historical Provider, MD  JANUVIA 100 MG tablet Take 1 tablet by mouth daily. 09/19/14  Yes Historical Provider, MD  lisinopril-hydrochlorothiazide (PRINZIDE,ZESTORETIC) 20-12.5 MG per tablet Take 1 tablet by mouth daily. 10/29/14  Yes Historical Provider, MD  metFORMIN (GLUCOPHAGE) 1000 MG tablet Take 1,000 mg by mouth 2 (two) times daily with a meal.   Yes Historical Provider, MD  omeprazole (PRILOSEC) 20 MG capsule Take 20 mg by mouth daily. 09/19/14  Yes Historical Provider, MD  PRISTIQ 50 MG 24 hr tablet Take 50 mg by mouth daily. 10/08/14  Yes Historical Provider, MD  doxycycline (VIBRAMYCIN) 50 MG capsule Take 2 capsules (100 mg total) by mouth 2 (two) times daily. Patient not taking: Reported on 11/02/2014 12/30/13   Kari Baars, MD  HYDROcodone-acetaminophen [facility]) 7.5-325 MG per tablet Take 1 tablet by mouth every 4 (four) hours as needed. Patient not taking: Reported on 11/02/2014 12/20/13   Ivery Quale, PA-C  loperamide (IMODIUM) 2 MG capsule Take 2 capsules (4 mg total) by mouth as needed for  diarrhea or loose stools. 11/03/14   Shon Baton, MD  ondansetron (ZOFRAN ODT) 4 MG disintegrating tablet Take 1 tablet (4 mg total) by mouth every 8 (eight) hours as needed for nausea or vomiting. 11/03/14   Shon Baton, MD  pantoprazole (PROTONIX) 40 MG tablet Take 1 tablet (40 mg total) by mouth daily. 11/03/14   Shon Baton, MD   BP 141/90 mmHg  Pulse 86  Temp(Src) 97.6 F (36.4 C) (Oral)  Resp 20  Ht 5\' 3"  (1.6 m)  Wt 220 lb (99.791 kg)  BMI 38.98 kg/m2  SpO2 98%   Physical Exam  Constitutional: She is oriented to person, place, and time.  Overweight, no acute distress  HENT:  Head: Normocephalic and atraumatic.  Mouth/Throat: Oropharynx is clear and moist.  Cardiovascular: Normal rate, regular rhythm and normal heart sounds.   No murmur heard. Pulmonary/Chest: Effort normal and breath sounds normal. No respiratory distress. She has no wheezes.  Abdominal: Soft. Bowel sounds are normal. There is no tenderness. There is no rebound and no guarding.  Neurological: She is alert and oriented to person, place, and time.  Skin: Skin is warm and dry.  Psychiatric: She has a normal mood and affect.  Nursing note and vitals reviewed.   ED Course  Procedures (including critical care time) DIAGNOSTIC STUDIES: Oxygen Saturation is 98% on RA, normal by my interpretation.    COORDINATION OF CARE: 11:10 PM-Discussed treatment plan with pt at bedside and pt agreed to plan.     Labs Review Labs Reviewed  COMPREHENSIVE METABOLIC PANEL - Abnormal; Notable for the following:    Potassium 3.3 (*)    Glucose, Bld 128 (*)    AST 14 (*)    All other components within normal limits  CBC WITH DIFFERENTIAL/PLATELET  TROPONIN I    Imaging Review No results found.   EKG Interpretation NSR with a rate of 65, no ST elevation elevation or ischemia      MDM   Final diagnoses:  Diarrhea    Patient presents with diarrhea. One episode of emesis. Temporary relief with  Imodium. Otherwise nontoxic-appearing. Appears hydrated. No tenderness on abdominal exam. Patient given Imodium and Protonix given increased GERD symptoms last night. Troponin negative and repeat EKGs reassuring as well as basic labwork. Patient improved and requesting food. Able to tolerate this without difficulty. Will discharge home with symptom management.  After history, exam, and medical workup I feel the patient has been appropriately medically screened and is safe for discharge home. Pertinent diagnoses were discussed with the patient. Patient was given return precautions.  I personally performed the services described in this documentation, which was scribed in my presence. The recorded information has been reviewed and is accurate.      Shon Baton, MD 11/03/14 3329  Shon Baton, MD  11/03/14 0039 

## 2014-11-02 NOTE — ED Notes (Addendum)
Pt c/o loose watery stools since x 3 days. Also c/o nausea. imodium without relief

## 2014-11-03 MED ORDER — PANTOPRAZOLE SODIUM 40 MG PO TBEC: 40.0000 mg | DELAYED_RELEASE_TABLET | Freq: Every day | ORAL | Status: DC

## 2014-11-03 MED ORDER — ONDANSETRON 4 MG PO TBDP: 4.0000 mg | ORAL_TABLET | Freq: Three times a day (TID) | ORAL | Status: DC | PRN

## 2014-11-03 MED ORDER — LOPERAMIDE HCL 2 MG PO CAPS: 4.0000 mg | ORAL_CAPSULE | ORAL | Status: DC | PRN

## 2014-11-03 NOTE — ED Notes (Signed)
Discharge instructions given, pt demonstrated teach back and verbal understanding. No concerns voiced.  

## 2014-11-03 NOTE — Discharge Instructions (Signed)

## 2014-12-05 ENCOUNTER — Emergency Department (HOSPITAL_COMMUNITY)
Admission: EM | Admit: 2014-12-05 | Discharge: 2014-12-05 | Discharge status: Against Medical Advice | Payer: Medicaid Other | Attending: Emergency Medicine | Admitting: Emergency Medicine

## 2014-12-05 ENCOUNTER — Encounter (HOSPITAL_COMMUNITY): Payer: Self-pay | Admitting: *Deleted

## 2014-12-05 DIAGNOSIS — I10 Essential (primary) hypertension: Secondary | ICD-10-CM | POA: Insufficient documentation

## 2014-12-05 DIAGNOSIS — Z720 Tobacco use: Secondary | ICD-10-CM | POA: Insufficient documentation

## 2014-12-05 DIAGNOSIS — M545 Low back pain: Secondary | ICD-10-CM | POA: Insufficient documentation

## 2014-12-05 DIAGNOSIS — M549 Dorsalgia, unspecified: Secondary | ICD-10-CM

## 2014-12-05 DIAGNOSIS — E119 Type 2 diabetes mellitus without complications: Secondary | ICD-10-CM | POA: Insufficient documentation

## 2014-12-05 DIAGNOSIS — G8929 Other chronic pain: Secondary | ICD-10-CM

## 2014-12-05 NOTE — ED Provider Notes (Signed)
Sarah Charles is a 54 y.o. female who presents to the ED with chronic back pain.  I went into the room to see the patient and there was no one in the room. The RN looked for the patient but unable to locate her.  BP 157/97 mmHg  Pulse 115  Temp(Src) 97.5 F (36.4 C) (Oral)  Resp 20  Ht 5\' 3"  (1.6 m)  Wt 215 lb (97.523 kg)  BMI 38.09 kg/m2  SpO2 96%   Patient left without being seen.   [address]Hope BlackwellM Neese, TexasNP 12/05/14 [phone]  Bethann BerkshireJoseph Zammit, MD 12/09/14 1357

## 2014-12-05 NOTE — ED Notes (Signed)
Chronic lower back pain became more severe last night. No known injury.  Numbness/tingling of R leg. Denies loss of bowel of bladder control.

## 2014-12-05 NOTE — ED Notes (Signed)
Pt noted to not be in room - not in bathroom -

## 2014-12-22 ENCOUNTER — Emergency Department (HOSPITAL_COMMUNITY)
Admission: EM | Admit: 2014-12-22 | Discharge: 2014-12-22 | Disposition: A | Discharge status: Home / Self Care | Payer: Medicaid Other | Attending: Emergency Medicine | Admitting: Emergency Medicine

## 2014-12-22 ENCOUNTER — Encounter (HOSPITAL_COMMUNITY): Payer: Self-pay | Admitting: Emergency Medicine

## 2014-12-22 DIAGNOSIS — K219 Gastro-esophageal reflux disease without esophagitis: Secondary | ICD-10-CM | POA: Insufficient documentation

## 2014-12-22 DIAGNOSIS — Z794 Long term (current) use of insulin: Secondary | ICD-10-CM | POA: Insufficient documentation

## 2014-12-22 DIAGNOSIS — Z720 Tobacco use: Secondary | ICD-10-CM | POA: Insufficient documentation

## 2014-12-22 DIAGNOSIS — M545 Low back pain: Secondary | ICD-10-CM | POA: Insufficient documentation

## 2014-12-22 DIAGNOSIS — F329 Major depressive disorder, single episode, unspecified: Secondary | ICD-10-CM | POA: Insufficient documentation

## 2014-12-22 DIAGNOSIS — I10 Essential (primary) hypertension: Secondary | ICD-10-CM | POA: Insufficient documentation

## 2014-12-22 DIAGNOSIS — G8929 Other chronic pain: Secondary | ICD-10-CM | POA: Insufficient documentation

## 2014-12-22 DIAGNOSIS — M549 Dorsalgia, unspecified: Secondary | ICD-10-CM

## 2014-12-22 DIAGNOSIS — M1990 Unspecified osteoarthritis, unspecified site: Secondary | ICD-10-CM | POA: Insufficient documentation

## 2014-12-22 DIAGNOSIS — E785 Hyperlipidemia, unspecified: Secondary | ICD-10-CM | POA: Insufficient documentation

## 2014-12-22 DIAGNOSIS — Z79899 Other long term (current) drug therapy: Secondary | ICD-10-CM | POA: Insufficient documentation

## 2014-12-22 DIAGNOSIS — E119 Type 2 diabetes mellitus without complications: Secondary | ICD-10-CM | POA: Insufficient documentation

## 2014-12-22 MED ORDER — PREDNISONE 50 MG PO TABS
60.0000 mg | ORAL_TABLET | Freq: Once | ORAL | Status: AC
  Administered 2014-12-22: 60 mg via ORAL
  Filled 2014-12-22 (×2): qty 1

## 2014-12-22 MED ORDER — METHOCARBAMOL 500 MG PO TABS
500.0000 mg | ORAL_TABLET | Freq: Once | ORAL | Status: AC
  Administered 2014-12-22: 500 mg via ORAL
  Filled 2014-12-22: qty 1

## 2014-12-22 MED ORDER — METHOCARBAMOL 500 MG PO TABS: 500.0000 mg | ORAL_TABLET | Freq: Two times a day (BID) | ORAL | Status: DC

## 2014-12-22 MED ORDER — PREDNISONE 20 MG PO TABS: 40.0000 mg | ORAL_TABLET | Freq: Every day | ORAL | Status: DC

## 2014-12-22 NOTE — ED Notes (Signed)
Pt. Reports back pain x3 weeks. Pt. Reports pain radiates to legs. Pt. Ambulatory at triage.

## 2014-12-22 NOTE — ED Provider Notes (Signed)
History  This chart was scribed for non-physician practitioner, Lanae Crumbly, PA-C,working with Glynn Octave, MD, by Karle Plumber, ED Scribe. This patient was seen in room [facility] and the patient's care was started at 7:21 PM.  Chief Complaint  Patient presents with  . Back Pain   The history is provided by the patient and medical records. No language interpreter was used.    HPI Comments:  Sarah Charles is a 54 y.o. obese female who presents to the Emergency Department complaining of severe, [place]celli, shooting lower back pain that began approximately three weeks ago. She states the pain radiates down her posterior BLE. No numbness or weakness of legs.  No loss of bowel or bladder control.  She has been taking OTC pain reliever with no significant relief of the symptoms. She denies modifying factors. She denies fever, chills, nausea or vomiting. PMHx of DM. She denies trauma, injury or fall recently.  States she does have some chronic back issues.  No prior surgeries.  No hx of cancer or IVDU.  VSS.  Past Medical History  Diagnosis Date  . Diabetes mellitus   . Arthritis   . Hypertension   . Hyperlipidemia   . GERD (gastroesophageal reflux disease)   . Sleep apnea     pt sts does not use "cause i dont think I need it anymore".  . Depression   . Hypercholesterolemia    Past Surgical History  Procedure Laterality Date  . Cholecystectomy    . Incision and drainage abscess Left 12/22/2012    Procedure: INCISION AND DRAINAGE ABSCESS;  Surgeon: Dalia Heading, MD;  Location: [facility];  Service: General;  Laterality: Left;   Family History  Problem Relation Age of Onset  . Hypertension Mother   . CVA Mother   . CAD Father   . Hypercholesterolemia Father    History  Substance Use Topics  . Smoking status: Current Every Day Smoker -- 0.50 packs/day for 40 years    Types: Cigarettes  . Smokeless tobacco: Not on file  . Alcohol Use: No   OB History    No data available      Review of Systems  Musculoskeletal: Positive for back pain.  All other systems reviewed and are negative.   Allergies  Cymbalta  Home Medications   Prior to Admission medications   Medication Sig Start Date End Date Taking? Authorizing Provider  acetaminophen (TYLENOL) 500 MG tablet Take 500 mg by mouth every 6 (six) hours as needed for mild pain.   Yes Historical Provider, MD  cloNIDine (CATAPRES) 0.1 MG tablet Take 0.1 mg by mouth 2 (two) times daily.   Yes Historical Provider, MD  CRESTOR 20 MG tablet Take 20 mg by mouth at bedtime. 09/19/14  Yes Historical Provider, MD  cyclobenzaprine (FLEXERIL) 10 MG tablet Take 10 mg by mouth daily.    Yes Historical Provider, MD  Fish Oil-Cholecalciferol (FISH OIL + D3 PO) Take 1 capsule by mouth daily.   Yes Historical Provider, MD  glipiZIDE (GLUCOTROL) 10 MG tablet Take 10 mg by mouth 2 (two) times daily. 09/05/14  Yes Historical Provider, MD  insulin glargine (LANTUS) 100 UNIT/ML injection Inject 10 Units into the skin at bedtime as needed.   Yes Historical Provider, MD  JANUVIA 100 MG tablet Take 1 tablet by mouth daily. 09/19/14  Yes Historical Provider, MD  lisinopril-hydrochlorothiazide (PRINZIDE,ZESTORETIC) 20-12.5 MG per tablet Take 1 tablet by mouth daily. 10/29/14  Yes Historical Provider, MD  loperamide (IMODIUM) 2 MG  capsule Take 2 capsules (4 mg total) by mouth as needed for diarrhea or loose stools. 11/03/14  Yes Shon Baton, MD  metFORMIN (GLUCOPHAGE) 1000 MG tablet Take 1,000 mg by mouth 2 (two) times daily with a meal.   Yes Historical Provider, MD  omeprazole (PRILOSEC) 20 MG capsule Take 20 mg by mouth daily. 09/19/14  Yes Historical Provider, MD  ondansetron (ZOFRAN ODT) 4 MG disintegrating tablet Take 1 tablet (4 mg total) by mouth every 8 (eight) hours as needed for nausea or vomiting. 11/03/14  Yes Shon Baton, MD  pantoprazole (PROTONIX) 40 MG tablet Take 1 tablet (40 mg total) by mouth daily. 11/03/14  Yes Shon Baton, MD  PRISTIQ 50 MG 24 hr tablet Take 50 mg by mouth daily. 10/08/14  Yes Historical Provider, MD  doxycycline (VIBRAMYCIN) 50 MG capsule Take 2 capsules (100 mg total) by mouth 2 (two) times daily. Patient not taking: Reported on 11/02/2014 12/30/13   Kari Baars, MD  HYDROcodone-acetaminophen [facility]) 7.5-325 MG per tablet Take 1 tablet by mouth every 4 (four) hours as needed. Patient not taking: Reported on 11/02/2014 12/20/13   Ivery Quale, PA-C   Triage Vitals: BP 121/73 mmHg  Pulse 93  Temp(Src) 97.5 F (36.4 C) (Oral)  Resp 20  Ht 5\' 3"  (1.6 m)  Wt 220 lb (99.791 kg)  BMI 38.98 kg/m2  SpO2 97% Physical Exam  Constitutional: She is oriented to person, place, and time. She appears well-developed and well-nourished. No distress.  HENT:  Head: Normocephalic and atraumatic.  Mouth/Throat: Oropharynx is clear and moist.  Eyes: Conjunctivae and EOM are normal. Pupils are equal, round, and reactive to light.  Neck: Normal range of motion. Neck supple.  Cardiovascular: Normal rate, regular rhythm and normal heart sounds.   Pulmonary/Chest: Effort normal and breath sounds normal.  Musculoskeletal: Normal range of motion.       Lumbar back: She exhibits tenderness and pain.       Back:  Tenderness of SI joints bilaterally; no midline tenderness, deformity, or step-off; full range of motion maintained;  Negative SLR bilaterally; normal strength and sensation of bilateral lower extremities, normal gait  Neurological: She is alert and oriented to person, place, and time.  Skin: Skin is warm and dry. She is not diaphoretic.  Psychiatric: She has a normal mood and affect.  Nursing note and vitals reviewed.   ED Course  Procedures (including critical care time) DIAGNOSTIC STUDIES: Oxygen Saturation is 97% on RA, normal by my interpretation.   COORDINATION OF CARE: 7:23 PM- Will prescribe prednisone taper and robaxin and advised pt to follow up with PCP. Pt verbalizes understanding  and agrees to plan.  Medications - No data to display  Labs Review Labs Reviewed - No data to display  Imaging Review No results found.   EKG Interpretation None      MDM   Final diagnoses:  Back pain, unspecified location   54 year old female here with acute on chronic back pain. Current pain is been ongoing for the past 3 weeks. She has tenderness of bilateral SI joint without noted deformity. She has normal strength and sensation of bilateral lower extremities. Normal gait.  Patient afebrile, non-toxic.  Clinical picture not concerning for cauda equina, discitis, epidural abscess, or other emergent pathology. Patient we discharged home with prednisone taper and Robaxin. She is to follow-up with her PCP.  Discussed plan with patient, he/she acknowledged understanding and agreed with plan of care.  Return precautions given for  new or worsening symptoms.  I personally performed the services described in this documentation, which was scribed in my presence. The recorded information has been reviewed and is accurate.  Garlon Hatchet, PA-C 12/22/14 2021  Glynn Octave, MD 12/22/14 [phone]

## 2014-12-22 NOTE — Discharge Instructions (Signed)
Take the prescribed medication as directed.  May wish to use heat therapy (hot soaks, heating pad, etc) to help ease muscle soreness as well. Follow-up with your primary care physician. Return to the ED for new or worsening symptoms.

## 2015-01-28 ENCOUNTER — Emergency Department (HOSPITAL_COMMUNITY)
Admission: EM | Admit: 2015-01-28 | Discharge: 2015-01-29 | Disposition: A | Discharge status: Home / Self Care | Payer: Medicaid Other | Attending: Emergency Medicine | Admitting: Emergency Medicine

## 2015-01-28 ENCOUNTER — Encounter (HOSPITAL_COMMUNITY): Payer: Self-pay | Admitting: *Deleted

## 2015-01-28 DIAGNOSIS — Z8669 Personal history of other diseases of the nervous system and sense organs: Secondary | ICD-10-CM | POA: Insufficient documentation

## 2015-01-28 DIAGNOSIS — K219 Gastro-esophageal reflux disease without esophagitis: Secondary | ICD-10-CM | POA: Insufficient documentation

## 2015-01-28 DIAGNOSIS — Z794 Long term (current) use of insulin: Secondary | ICD-10-CM | POA: Insufficient documentation

## 2015-01-28 DIAGNOSIS — Z8659 Personal history of other mental and behavioral disorders: Secondary | ICD-10-CM | POA: Insufficient documentation

## 2015-01-28 DIAGNOSIS — M1990 Unspecified osteoarthritis, unspecified site: Secondary | ICD-10-CM | POA: Insufficient documentation

## 2015-01-28 DIAGNOSIS — E780 Pure hypercholesterolemia: Secondary | ICD-10-CM | POA: Insufficient documentation

## 2015-01-28 DIAGNOSIS — Z79899 Other long term (current) drug therapy: Secondary | ICD-10-CM | POA: Insufficient documentation

## 2015-01-28 DIAGNOSIS — Z720 Tobacco use: Secondary | ICD-10-CM | POA: Insufficient documentation

## 2015-01-28 DIAGNOSIS — R3915 Urgency of urination: Secondary | ICD-10-CM

## 2015-01-28 DIAGNOSIS — E785 Hyperlipidemia, unspecified: Secondary | ICD-10-CM | POA: Insufficient documentation

## 2015-01-28 DIAGNOSIS — E1165 Type 2 diabetes mellitus with hyperglycemia: Secondary | ICD-10-CM

## 2015-01-28 DIAGNOSIS — B372 Candidiasis of skin and nail: Secondary | ICD-10-CM

## 2015-01-28 LAB — BASIC METABOLIC PANEL
Anion gap: 9 (ref 5–15)
BUN: 11 mg/dL (ref 6–20)
CO2: 26 mmol/L (ref 22–32)
Calcium: 9 mg/dL (ref 8.9–10.3)
Chloride: 99 mmol/L — ABNORMAL LOW (ref 101–111)
Creatinine, Ser: 0.65 mg/dL (ref 0.44–1.00)
GFR calc Af Amer: >60 mL/min (ref >60)
GFR calc non Af Amer: >60 mL/min (ref >60)
Glucose, Bld: 516 mg/dL — ABNORMAL HIGH (ref 65–99)
Potassium: 3.9 mmol/L (ref 3.5–5.1)
Sodium: 134 mmol/L — ABNORMAL LOW (ref 135–145)

## 2015-01-28 LAB — CBC WITH DIFFERENTIAL/PLATELET
Basophils Absolute: 0 10*3/uL (ref 0.0–0.1)
Basophils Relative: 1 % (ref 0–1)
Eosinophils Absolute: 0.1 10*3/uL (ref 0.0–0.7)
Eosinophils Relative: 2 % (ref 0–5)
HCT: 39.4 % (ref 36.0–46.0)
Hemoglobin: 13.3 g/dL (ref 12.0–15.0)
Lymphocytes Relative: 36 % (ref 12–46)
Lymphs Abs: 2.5 10*3/uL (ref 0.7–4.0)
MCH: 30.8 pg (ref 26.0–34.0)
MCHC: 33.8 g/dL (ref 30.0–36.0)
MCV: 91.2 fL (ref 78.0–100.0)
Monocytes Absolute: 0.5 10*3/uL (ref 0.1–1.0)
Monocytes Relative: 7 % (ref 3–12)
Neutro Abs: 3.9 10*3/uL (ref 1.7–7.7)
Neutrophils Relative %: 56 % (ref 43–77)
Platelets: 221 10*3/uL (ref 150–400)
RBC: 4.32 MIL/uL (ref 3.87–5.11)
RDW: 14 % (ref 11.5–15.5)
WBC: 7 10*3/uL (ref 4.0–10.5)

## 2015-01-28 LAB — URINALYSIS, ROUTINE W REFLEX MICROSCOPIC
Bilirubin Urine: NEGATIVE
Glucose, UA: >1000 mg/dL — AB
Hgb urine dipstick: NEGATIVE
Ketones, ur: NEGATIVE mg/dL
Leukocytes, UA: NEGATIVE
Nitrite: NEGATIVE
Protein, ur: NEGATIVE mg/dL
Specific Gravity, Urine: 1.015 (ref 1.005–1.030)
Urobilinogen, UA: 0.2 mg/dL (ref 0.0–1.0)
pH: 5.5 (ref 5.0–8.0)

## 2015-01-28 LAB — URINE MICROSCOPIC-ADD ON

## 2015-01-28 LAB — CBG MONITORING, ED: Glucose-Capillary: 506 mg/dL — ABNORMAL HIGH (ref 65–99)

## 2015-01-28 MED ORDER — INSULIN ASPART 100 UNIT/ML ~~LOC~~ SOLN
10.0000 [IU] | Freq: Once | SUBCUTANEOUS | Status: AC
  Administered 2015-01-29: 10 [IU] via INTRAVENOUS
  Filled 2015-01-28: qty 1

## 2015-01-28 MED ORDER — SODIUM CHLORIDE 0.9 % IV BOLUS (SEPSIS)
1000.0000 mL | Freq: Once | INTRAVENOUS | Status: AC
  Administered 2015-01-28: 1000 mL via INTRAVENOUS

## 2015-01-28 NOTE — ED Notes (Signed)
Pt c/o urinary frequency that started x 1 week ago

## 2015-01-29 LAB — CBG MONITORING, ED: Glucose-Capillary: 333 mg/dL — ABNORMAL HIGH (ref 65–99)

## 2015-01-29 MED ORDER — SODIUM CHLORIDE 0.9 % IV BOLUS (SEPSIS)
1000.0000 mL | Freq: Once | INTRAVENOUS | Status: AC
  Administered 2015-01-29: 1000 mL via INTRAVENOUS

## 2015-01-29 MED ORDER — GLIPIZIDE 10 MG PO TABS: 10.0000 mg | ORAL_TABLET | Freq: Every day | ORAL | Status: DC

## 2015-01-29 MED ORDER — GLIPIZIDE 10 MG PO TABS: 10.0000 mg | ORAL_TABLET | Freq: Two times a day (BID) | ORAL | Status: DC

## 2015-01-29 MED ORDER — SOLIFENACIN SUCCINATE 10 MG PO TABS: 10.0000 mg | ORAL_TABLET | Freq: Every day | ORAL | Status: DC

## 2015-01-29 MED ORDER — METFORMIN HCL 500 MG PO TABS
1000.0000 mg | ORAL_TABLET | Freq: Once | ORAL | Status: AC
Start: 2015-01-29 — End: 2015-01-29
  Administered 2015-01-29: 1000 mg via ORAL
  Filled 2015-01-29: qty 2

## 2015-01-29 MED ORDER — CLOTRIMAZOLE 1 % EX CREA: TOPICAL_CREAM | CUTANEOUS | Status: DC

## 2015-01-29 MED ORDER — SODIUM CHLORIDE 0.9 % IV BOLUS (SEPSIS): 1000.0000 mL | Freq: Once | INTRAVENOUS | Status: DC

## 2015-01-29 MED ORDER — METFORMIN HCL 1000 MG PO TABS: 1000.0000 mg | ORAL_TABLET | Freq: Two times a day (BID) | ORAL | Status: DC

## 2015-01-29 NOTE — ED Notes (Signed)
Pt ambulated to restroom & returned to room w/ no complications. 

## 2015-01-29 NOTE — ED Notes (Signed)
Pt alert & oriented x4, stable gait. Patient given discharge instructions, paperwork & prescription(s). Patient  instructed to stop at the registration desk to finish any additional paperwork. Patient verbalized understanding. Pt left department w/ no further questions. 

## 2015-01-29 NOTE — ED Notes (Signed)
Patient drinking coffee at this time.

## 2015-01-29 NOTE — Discharge Instructions (Signed)
Candida Infection A Candida infection (also called yeast, fungus, and Monilia infection) is an overgrowth of yeast that can occur anywhere on the body. A yeast infection commonly occurs in warm, moist body areas. Usually, the infection remains localized but can spread to become a systemic infection. A yeast infection may be a sign of a more severe disease such as diabetes, leukemia, or AIDS. A yeast infection can occur in both men and women. In women, Candida vaginitis is a vaginal infection. It is one of the most common causes of vaginitis. Men usually do not have symptoms or know they have an infection until other problems develop. Men may find out they have a yeast infection because their sex partner has a yeast infection. Uncircumcised men are more likely to get a yeast infection than circumcised men. This is because the uncircumcised glans is not exposed to air and does not remain as dry as that of a circumcised glans. Older adults may develop yeast infections around dentures. CAUSES  Women  Antibiotics.  Steroid medication taken for a long time.  Being overweight (obese).  Diabetes.  Poor immune condition.  Certain serious medical conditions.  Immune suppressive medications for organ transplant patients.  Chemotherapy.  Pregnancy.  Menstruation.  Stress and fatigue.  Intravenous drug use.  Oral contraceptives.  Wearing tight-fitting clothes in the crotch area.  Catching it from a sex partner who has a yeast infection.  Spermicide.  Intravenous, urinary, or other catheters. Men  Catching it from a sex partner who has a yeast infection.  Having oral or anal sex with a person who has the infection.  Spermicide.  Diabetes.  Antibiotics.  Poor immune system.  Medications that suppress the immune system.  Intravenous drug use.  Intravenous, urinary, or other catheters. SYMPTOMS  Women  Thick, white vaginal discharge.  Vaginal itching.  Redness and  swelling in and around the vagina.  Irritation of the lips of the vagina and perineum.  Blisters on the vaginal lips and perineum.  Painful sexual intercourse.  Low blood sugar (hypoglycemia).  Painful urination.  Bladder infections.  Intestinal problems such as constipation, indigestion, bad breath, bloating, increase in gas, diarrhea, or loose stools. Men  Men may develop intestinal problems such as constipation, indigestion, bad breath, bloating, increase in gas, diarrhea, or loose stools.  Dry, cracked skin on the penis with itching or discomfort.  Jock itch.  Dry, flaky skin.  Athlete's foot.  Hypoglycemia. DIAGNOSIS  Women  A history and an exam are performed.  The discharge may be examined under a microscope.  A culture may be taken of the discharge. Men  A history and an exam are performed.  Any discharge from the penis or areas of cracked skin will be looked at under the microscope and cultured.  Stool samples may be cultured. TREATMENT  Women  Vaginal antifungal suppositories and creams.  Medicated creams to decrease irritation and itching on the outside of the vagina.  Warm compresses to the perineal area to decrease swelling and discomfort.  Oral antifungal medications.  Medicated vaginal suppositories or cream for repeated or recurrent infections.  Wash and dry the irritation areas before applying the cream.  Eating yogurt with Lactobacillus may help with prevention and treatment.  Sometimes painting the vagina with gentian violet solution may help if creams and suppositories do not work. Men  Antifungal creams and oral antifungal medications.  Sometimes treatment must continue for 30 days after the symptoms go away to prevent recurrence. HOME CARE INSTRUCTIONS  Women  Use cotton underwear and avoid tight-fitting clothing.  Avoid colored, scented toilet paper and deodorant tampons or pads.  Do not douche.  Keep your diabetes  under control.  Finish all the prescribed medications.  Keep your skin clean and dry.  Consume milk or yogurt with Lactobacillus-active culture regularly. If you get frequent yeast infections and think that is what the infection is, there are over-the-counter medications that you can get. If the infection does not show healing in 3 days, talk to your caregiver.  Tell your sex partner you have a yeast infection. Your partner may need treatment also, especially if your infection does not clear up or recurs. Men  Keep your skin clean and dry.  Keep your diabetes under control.  Finish all prescribed medications.  Tell your sex partner that you have a yeast infection so he or she can be treated if necessary. SEEK MEDICAL CARE IF:   Your symptoms do not clear up or worsen in one week after treatment.  You have an oral temperature above 102 F (38.9 C).  You have trouble swallowing or eating for a prolonged time.  You develop blisters on and around your vagina.  You develop vaginal bleeding and it is not your menstrual period.  You develop abdominal pain.  You develop intestinal problems as mentioned above.  You get weak or light-headed.  You have painful or increased urination.  You have pain during sexual intercourse. MAKE SURE YOU:   Understand these instructions.  Will watch your condition.  Will get help right away if you are not doing well or get worse. Document Released: 06/17/2004 Document Revised: 09/24/2013 Document Reviewed: 09/29/2009 [facility] Patient Information 2015 [place], [place]. This information is not intended to replace advice given to you by your health care provider. Make sure you discuss any questions you have with your health care provider.  Hyperglycemia Hyperglycemia occurs when the glucose (sugar) in your blood is too high. Hyperglycemia can happen for many reasons, but it most often happens to people who do not know they have diabetes or are  not managing their diabetes properly.  CAUSES  Whether you have diabetes or not, there are other causes of hyperglycemia. Hyperglycemia can occur when you have diabetes, but it can also occur in other situations that you might not be as aware of, such as: Diabetes  If you have diabetes and are having problems controlling your blood glucose, hyperglycemia could occur because of some of the following reasons:  Not following your meal plan.  Not taking your diabetes medications or not taking it properly.  Exercising less or doing less activity than you normally do.  Being sick. Pre-diabetes  This cannot be ignored. Before people develop Type 2 diabetes, they almost always have "pre-diabetes." This is when your blood glucose levels are higher than normal, but not yet high enough to be diagnosed as diabetes. Research has shown that some long-term damage to the body, especially the heart and circulatory system, may already be occurring during pre-diabetes. If you take action to manage your blood glucose when you have pre-diabetes, you may delay or prevent Type 2 diabetes from developing. Stress  If you have diabetes, you may be "diet" controlled or on oral medications or insulin to control your diabetes. However, you may find that your blood glucose is higher than usual in the hospital whether you have diabetes or not. This is often referred to as "stress hyperglycemia." Stress can elevate your blood glucose. This happens because  of hormones put out by the body during times of stress. If stress has been the cause of your high blood glucose, it can be followed regularly by your caregiver. That way he/she can make sure your hyperglycemia does not continue to get worse or progress to diabetes. Steroids  Steroids are medications that act on the infection fighting system (immune system) to block inflammation or infection. One side effect can be a rise in blood glucose. Most people can produce enough extra  insulin to allow for this rise, but for those who cannot, steroids make blood glucose levels go even higher. It is not unusual for steroid treatments to "uncover" diabetes that is developing. It is not always possible to determine if the hyperglycemia will go away after the steroids are stopped. A special blood test called an A1c is sometimes done to determine if your blood glucose was elevated before the steroids were started. SYMPTOMS  Thirsty.  Frequent urination.  Dry mouth.  Blurred vision.  Tired or fatigue.  Weakness.  Sleepy.  Tingling in feet or leg. DIAGNOSIS  Diagnosis is made by monitoring blood glucose in one or all of the following ways:  A1c test. This is a chemical found in your blood.  Fingerstick blood glucose monitoring.  Laboratory results. TREATMENT  First, knowing the cause of the hyperglycemia is important before the hyperglycemia can be treated. Treatment may include, but is not be limited to:  Education.  Change or adjustment in medications.  Change or adjustment in meal plan.  Treatment for an illness, infection, etc.  More frequent blood glucose monitoring.  Change in exercise plan.  Decreasing or stopping steroids.  Lifestyle changes. HOME CARE INSTRUCTIONS   Test your blood glucose as directed.  Exercise regularly. Your caregiver will give you instructions about exercise. Pre-diabetes or diabetes which comes on with stress is helped by exercising.  Eat wholesome, balanced meals. Eat often and at regular, fixed times. Your caregiver or nutritionist will give you a meal plan to guide your sugar intake.  Being at an ideal weight is important. If needed, losing as little as 10 to 15 pounds may help improve blood glucose levels. SEEK MEDICAL CARE IF:   You have questions about medicine, activity, or diet.  You continue to have symptoms (problems such as increased thirst, urination, or weight gain). SEEK IMMEDIATE MEDICAL CARE IF:    You are vomiting or have diarrhea.  Your breath smells fruity.  You are breathing faster or slower.  You are very sleepy or incoherent.  You have numbness, tingling, or pain in your feet or hands.  You have chest pain.  Your symptoms get worse even though you have been following your caregiver's orders.  If you have any other questions or concerns. Document Released: 11/03/2000 Document Revised: 08/02/2011 Document Reviewed: 09/06/2011 [facility] Patient Information 2015 [place], [place]. This information is not intended to replace advice given to you by your health care provider. Make sure you discuss any questions you have with your health care provider.

## 2015-01-30 NOTE — ED Provider Notes (Signed)
CSN: [id number]     Arrival date & time 01/28/15  2152 History   First MD Initiated Contact with Patient 01/28/15 2241     Chief Complaint  Patient presents with  . Urinary Frequency     (Consider location/radiation/quality/duration/timing/severity/associated sxs/prior Treatment) The history is provided by the patient.   Sarah Charles is a 54 y.o. female with a history most significant for insuline dependent diabetes presenting with a 1 week history of increased urinary frequency and urgency. She denies dysuria, hematuria, fevers, chills, nausea, vomiting, abdominal or pelvic or back pain.  She reports always drinking "alot" of fluids, has not noticed increased thirst since the urinary symptoms began.  She has been out of her DM medications including her Lantus, her Januvia, metformin and the glucotrol for the past month and endorses her blood glucose has not been well controlled.  She reports a death in her family, was unable to [facility], so had to wait a month, hence ran out of her medicines.  She has re-established care and is anticipating having her medicines available to her (provided by the clinic) within the next 1-2 days.       Past Medical History  Diagnosis Date  . Diabetes mellitus   . Arthritis   . Hypertension   . Hyperlipidemia   . GERD (gastroesophageal reflux disease)   . Sleep apnea     pt sts does not use "cause i dont think I need it anymore".  . Depression   . Hypercholesterolemia    Past Surgical History  Procedure Laterality Date  . Cholecystectomy    . Incision and drainage abscess Left 12/22/2012    Procedure: INCISION AND DRAINAGE ABSCESS;  Surgeon: Dalia Heading, MD;  Location: [facility];  Service: General;  Laterality: Left;   Family History  Problem Relation Age of Onset  . Hypertension Mother   . CVA Mother   . CAD Father   . Hypercholesterolemia Father    Social History  Substance Use Topics  . Smoking status:  Current Every Day Smoker -- 0.50 packs/day for 40 years    Types: Cigarettes  . Smokeless tobacco: None  . Alcohol Use: No   OB History    No data available     Review of Systems  Constitutional: Negative for fever and chills.  HENT: Negative for congestion and sore throat.   Eyes: Negative.   Respiratory: Negative for chest tightness and shortness of breath.   Cardiovascular: Negative for chest pain.  Gastrointestinal: Negative for nausea, vomiting and abdominal pain.  Genitourinary: Positive for urgency and frequency. Negative for dysuria, hematuria, vaginal discharge and pelvic pain.  Musculoskeletal: Negative for back pain, joint swelling, arthralgias and neck pain.  Skin: Negative.  Negative for rash and wound.  Neurological: Negative for dizziness, weakness, light-headedness, numbness and headaches.  Psychiatric/Behavioral: Negative.       Allergies  Cymbalta  Home Medications   Prior to Admission medications   Medication Sig Start Date End Date Taking? Authorizing Provider  acetaminophen (TYLENOL) 500 MG tablet Take 500 mg by mouth every 6 (six) hours as needed for mild pain.    Historical Provider, MD  cloNIDine (CATAPRES) 0.1 MG tablet Take 0.1 mg by mouth 2 (two) times daily.    Historical Provider, MD  clotrimazole (LOTRIMIN) 1 % cream Apply to affected area 2 times daily 01/29/15   Burgess Amor, PA-C  CRESTOR 20 MG tablet Take 20 mg by mouth at  bedtime. 09/19/14   Historical Provider, MD  cyclobenzaprine (FLEXERIL) 10 MG tablet Take 10 mg by mouth daily.     Historical Provider, MD  doxycycline (VIBRAMYCIN) 50 MG capsule Take 2 capsules (100 mg total) by mouth 2 (two) times daily. Patient not taking: Reported on 11/02/2014 12/30/13   Kari Baars, MD  Fish Oil-Cholecalciferol (FISH OIL + D3 PO) Take 1 capsule by mouth daily.    Historical Provider, MD  glipiZIDE (GLUCOTROL) 10 MG tablet Take 1 tablet (10 mg total) by mouth 2 (two) times daily before a meal. 01/29/15    Burgess Amor, PA-C  HYDROcodone-acetaminophen (NORCO) 7.5-325 MG per tablet Take 1 tablet by mouth every 4 (four) hours as needed. Patient not taking: Reported on 11/02/2014 12/20/13   Ivery Quale, PA-C  insulin glargine (LANTUS) 100 UNIT/ML injection Inject 10 Units into the skin at bedtime as needed.    Historical Provider, MD  JANUVIA 100 MG tablet Take 1 tablet by mouth daily. 09/19/14   Historical Provider, MD  lisinopril-hydrochlorothiazide (PRINZIDE,ZESTORETIC) 20-12.5 MG per tablet Take 1 tablet by mouth daily. 10/29/14   Historical Provider, MD  loperamide (IMODIUM) 2 MG capsule Take 2 capsules (4 mg total) by mouth as needed for diarrhea or loose stools. 11/03/14   Shon Baton, MD  metFORMIN (GLUCOPHAGE) 1000 MG tablet Take 1 tablet (1,000 mg total) by mouth 2 (two) times daily. 01/29/15   Burgess Amor, PA-C  methocarbamol (ROBAXIN) 500 MG tablet Take 1 tablet (500 mg total) by mouth 2 (two) times daily. 12/22/14   Garlon Hatchet, PA-C  omeprazole (PRILOSEC) 20 MG capsule Take 20 mg by mouth daily. 09/19/14   Historical Provider, MD  ondansetron (ZOFRAN ODT) 4 MG disintegrating tablet Take 1 tablet (4 mg total) by mouth every 8 (eight) hours as needed for nausea or vomiting. Patient not taking: Reported on 01/28/2015 11/03/14   Shon Baton, MD  pantoprazole (PROTONIX) 40 MG tablet Take 1 tablet (40 mg total) by mouth daily. 11/03/14   Shon Baton, MD  predniSONE (DELTASONE) 20 MG tablet Take 2 tablets (40 mg total) by mouth daily. Take 40 mg by mouth daily for 3 days, then 20mg  by mouth daily for 3 days, then 10mg  daily for 3 days 12/22/14   Garlon Hatchet, PA-C  PRISTIQ 50 MG 24 hr tablet Take 50 mg by mouth daily. 10/08/14   Historical Provider, MD  solifenacin (VESICARE) 10 MG tablet Take 1 tablet (10 mg total) by mouth daily. 01/29/15   Burgess Amor, PA-C   BP 148/94 mmHg  Pulse 80  Temp(Src) 98.2 F (36.8 C) (Oral)  Resp 20  Ht 5\' 2"  (1.575 m)  Wt 195 lb (88.451 kg)  BMI 35.66  kg/m2  SpO2 94% Physical Exam  Constitutional: She appears well-developed and well-nourished.  HENT:  Head: Normocephalic and atraumatic.  Eyes: Conjunctivae are normal.  Neck: Normal range of motion.  Cardiovascular: Normal rate, regular rhythm, normal heart sounds and intact distal pulses.   Pulmonary/Chest: Effort normal and breath sounds normal. She has no wheezes.  Abdominal: Soft. Bowel sounds are normal. There is no tenderness. There is no guarding.  Musculoskeletal: Normal range of motion.  Neurological: She is alert.  Skin: Skin is warm and dry. Rash noted. Rash is macular. There is erythema.  Erythematous macular rash with satellite lesions beneath both breasts.  Psychiatric: She has a normal mood and affect.  Nursing note and vitals reviewed.   ED Course  Procedures (including critical care  time) Labs Review Labs Reviewed  URINALYSIS, ROUTINE W REFLEX MICROSCOPIC (NOT AT [facility]) - Abnormal; Notable for the following:    Glucose, UA >1000 (*)    All other components within normal limits  BASIC METABOLIC PANEL - Abnormal; Notable for the following:    Sodium 134 (*)    Chloride 99 (*)    Glucose, Bld 516 (*)    All other components within normal limits  CBG MONITORING, ED - Abnormal; Notable for the following:    Glucose-Capillary 506 (*)    All other components within normal limits  CBG MONITORING, ED - Abnormal; Notable for the following:    Glucose-Capillary 333 (*)    All other components within normal limits  URINE CULTURE  CBC WITH DIFFERENTIAL/PLATELET  URINE MICROSCOPIC-ADD ON    Imaging Review No results found. I have personally reviewed and evaluated these images and lab results as part of my medical decision-making.   EKG Interpretation None      MDM   Final diagnoses:  Hyperglycemia due to type 2 diabetes mellitus  Yeast dermatitis  Urinary urgency    Patients labs reviewed.   Results were also discussed with patient. Pt with hyperglycemia  without ketosis secondary to noncompliance. Pt was given IV fluids and insulin with adequate improving blood glucose level.  She was prescribed the metformin and glucotrol which she states she can afford if her prescriptions do not arrive as anticipated.  She was also prescribed clotrimazole cream for her yeast infection.  Prior to dc,  Pt reports having chronic issues with urinary incontinence, even when cbg's are better controlled.  Pcp in [place] used to prescribe vesicare which worked for her very well.  Requested prescription of this which she was given.   The patient appears reasonably screened and/or stabilized for discharge and I doubt any other medical condition or other [facility] requiring further screening, evaluation, or treatment in the ED at this time prior to discharge.      Burgess Amor, PA-C 01/30/15 1655  Kristen N Ward, DO 02/03/15 [id number]

## 2015-01-31 LAB — URINE CULTURE: Culture: >=100000

## 2015-02-02 ENCOUNTER — Telehealth (HOSPITAL_COMMUNITY): Payer: Self-pay | Admitting: Emergency Medicine

## 2015-02-02 NOTE — Telephone Encounter (Signed)
Post ED Visit - Positive Culture Follow-up  Culture report reviewed by antimicrobial stewardship pharmacist:   Celedonio Miyamoto, Pharm.D., BCPS  [name], Pharm.D., BCPS  [place], [address].D., BCPS, AAHIVP  [name], Pharm.D., BCPS, AAHIVP  [employer], [address].D.  Okey Regal, Pharm.D.  Positive Urine culture No further patient follow-up is required at this time.  [name] 02/02/2015, 9:37 AM

## 2015-02-25 ENCOUNTER — Ambulatory Visit: Payer: Self-pay | Admitting: Physician Assistant

## 2015-02-25 VITALS — BP 116/80 | HR 68 | Temp 97.9°F | Ht 63.0 in | Wt 200.0 lb

## 2015-02-25 DIAGNOSIS — E118 Type 2 diabetes mellitus with unspecified complications: Secondary | ICD-10-CM

## 2015-02-25 DIAGNOSIS — R300 Dysuria: Secondary | ICD-10-CM

## 2015-02-25 DIAGNOSIS — N3090 Cystitis, unspecified without hematuria: Secondary | ICD-10-CM

## 2015-02-25 LAB — POCT URINALYSIS DIPSTICK
Bilirubin, UA: NEGATIVE
Blood, UA: NEGATIVE
Glucose, UA: >=1000
Ketones, UA: NEGATIVE
Leukocytes, UA: NEGATIVE
Nitrite, UA: POSITIVE
Protein, UA: NEGATIVE
Spec Grav, UA: >=1.03
Urobilinogen, UA: 0.2
pH, UA: 5

## 2015-02-25 MED ORDER — CIPROFLOXACIN HCL 500 MG PO TABS: 500.0000 mg | ORAL_TABLET | Freq: Two times a day (BID) | ORAL | Status: AC

## 2015-02-25 MED ORDER — SITAGLIPTIN PHOSPHATE 100 MG PO TABS: 100.0000 mg | ORAL_TABLET | Freq: Every day | ORAL | Status: DC

## 2015-03-06 ENCOUNTER — Ambulatory Visit: Payer: Self-pay | Admitting: Physician Assistant

## 2015-03-25 ENCOUNTER — Ambulatory Visit: Payer: Self-pay | Admitting: Physician Assistant

## 2015-04-20 DIAGNOSIS — E118 Type 2 diabetes mellitus with unspecified complications: Secondary | ICD-10-CM

## 2015-04-20 DIAGNOSIS — Z794 Long term (current) use of insulin: Secondary | ICD-10-CM

## 2015-04-20 DIAGNOSIS — IMO0002 Reserved for concepts with insufficient information to code with codable children: Secondary | ICD-10-CM | POA: Insufficient documentation

## 2015-04-20 DIAGNOSIS — E1165 Type 2 diabetes mellitus with hyperglycemia: Secondary | ICD-10-CM | POA: Insufficient documentation

## 2015-04-20 NOTE — Progress Notes (Signed)
   BP 116/80 mmHg  Pulse 68  Temp(Src) 97.9 F (36.6 C)  Ht 5\' 3"  (1.6 m)  Wt 200 lb (90.719 kg)  BMI 35.44 kg/m2  SpO2 98%   Subjective:    Patient ID: Sarah Charles, female    DOB: 12/27/1960, 54 y.o.   MRN: [id number]  HPI: Sarah Charles is a 54 y.o. female presenting on 02/25/2015 for Diabetes and Urinary Incontinence   HPI Chief Complaint  Patient presents with  . Diabetes    pt states she checks her sugar at home, but does not have a BS log.  running 125-300.  Marland Kitchen. Urinary Incontinence    pt states she wants something for her incontinence. pt states she can't go out without wetting herself. pt states she wets herself "every 15 minutes. pt states she can't make it to the bathroom without wetting herself.     Relevant past medical, surgical, family and social history reviewed and updated as indicated. Interim medical history since our last visit reviewed. Allergies and medications reviewed and updated.  Review of Systems  Per HPI unless specifically indicated above     Objective:    BP 116/80 mmHg  Pulse 68  Temp(Src) 97.9 F (36.6 C)  Ht 5\' 3"  (1.6 m)  Wt 200 lb (90.719 kg)  BMI 35.44 kg/m2  SpO2 98%  Wt Readings from Last 3 Encounters:  02/25/15 200 lb (90.719 kg)  01/28/15 195 lb (88.451 kg)  12/22/14 220 lb (99.791 kg)    Physical Exam  Constitutional: She is oriented to person, place, and time. She appears well-developed and well-nourished.  HENT:  Head: Normocephalic and atraumatic.  Neck: Neck supple.  Cardiovascular: Normal rate and regular rhythm.   Pulmonary/Chest: Effort normal and breath sounds normal.  Abdominal: Soft. Bowel sounds are normal. She exhibits no mass. There is no tenderness. There is no CVA tenderness.  obese  Lymphadenopathy:    She has no cervical adenopathy.  Neurological: She is alert and oriented to person, place, and time.  Skin: Skin is warm and dry.  Psychiatric: She has a normal mood and affect. Her behavior is normal.   Vitals reviewed.   Results for orders placed or performed in visit on 02/25/15  POCT Urinalysis Dipstick  Result Value Ref Range   Color, UA yellow    Clarity, UA clear    Glucose, UA >=1000    Bilirubin, UA neg    Ketones, UA neg    Spec Grav, UA >=1.030    Blood, UA neg    pH, UA 5.0    Protein, UA neg    Urobilinogen, UA 0.2    Nitrite, UA pos    Leukocytes, UA Negative Negative      Assessment & Plan:   Encounter Diagnoses  Name Primary?  . Dysuria Yes  . Cystitis   . Type 2 diabetes mellitus with complication, unspecified long term insulin use status (HCC)     rx cipro Counseled on diabetes management F/u 1 mo

## 2015-08-17 IMAGING — CT CT HEAD W/O CM
1 series · 16 of 30 positions shown, 20 images · non-contrast
Comparison: None.

CLINICAL DATA: Patient with diabetes and hypertension

EXAM:
CT HEAD WITHOUT CONTRAST
TECHNIQUE: Contiguous axial images were obtained from the base of the skull
through the vertex without intravenous contrast.

[Series 2: headseq 4.8 h37s · axial · 0.47mm/px · z∈[+106,+248]mm · 16 of 30 slices shown, 20 images]
[im 2/30  brain]
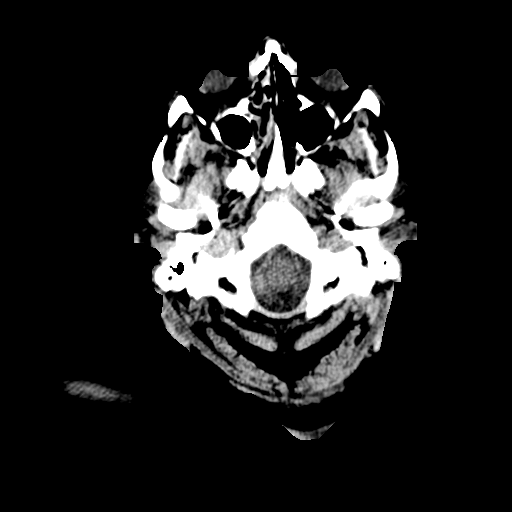
[im 2/30  bone]
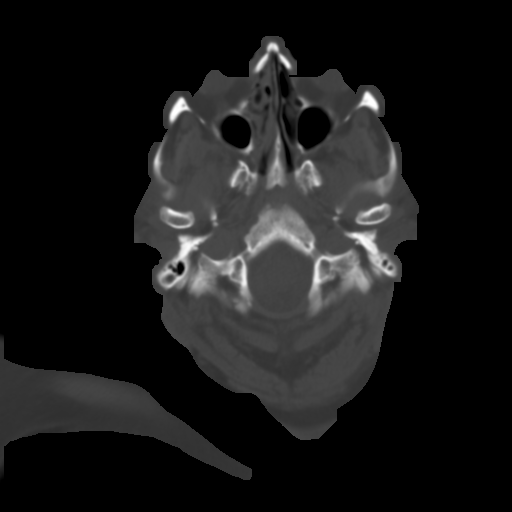
[im 4/30  brain]
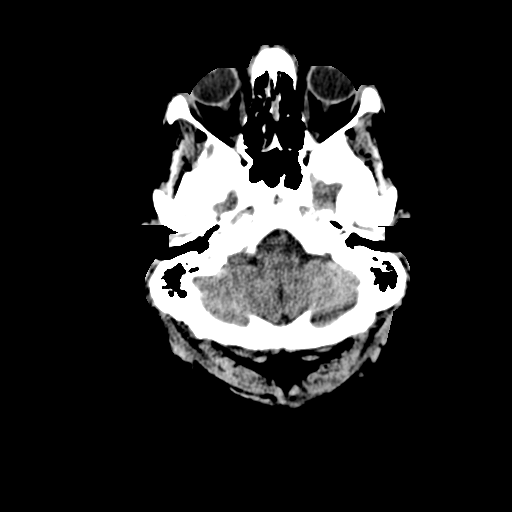
[im 6/30  brain]
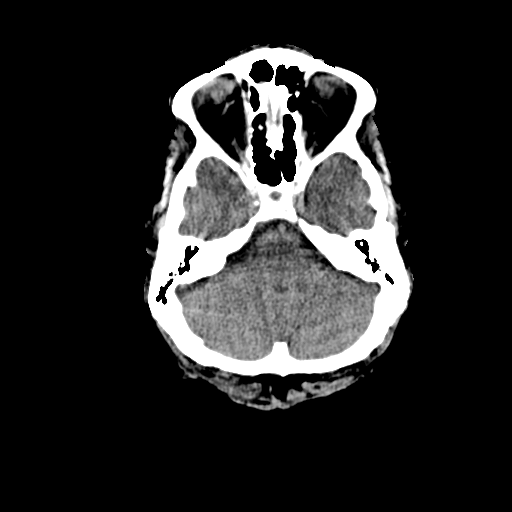
[im 8/30  brain]
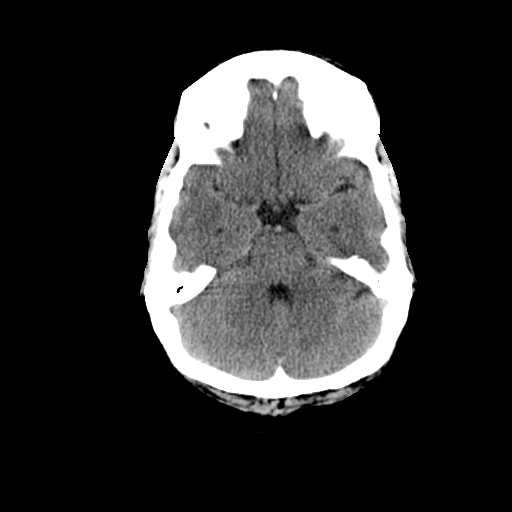
[im 9/30  brain]
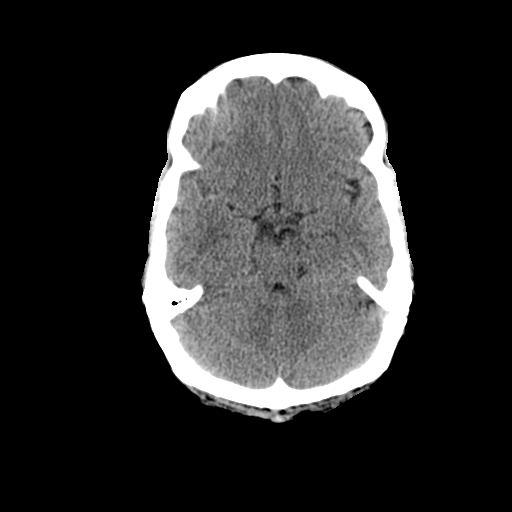
[im 9/30  bone]
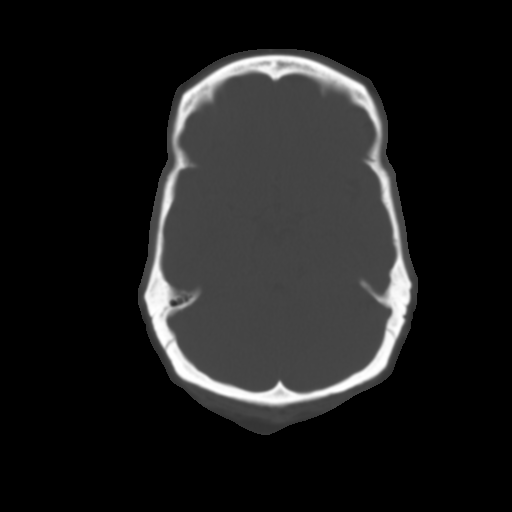
[im 11/30  brain]
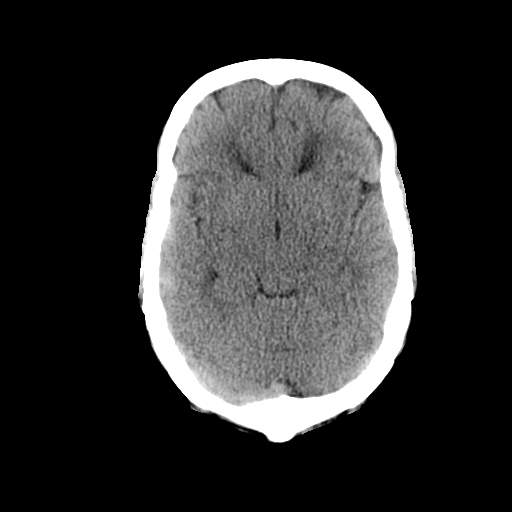
[im 13/30  brain]
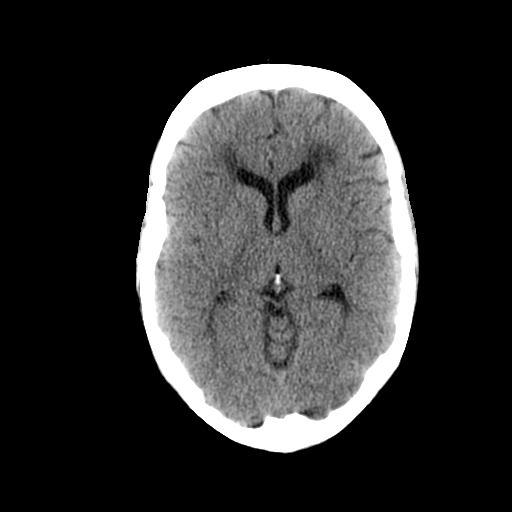
[im 15/30  brain]
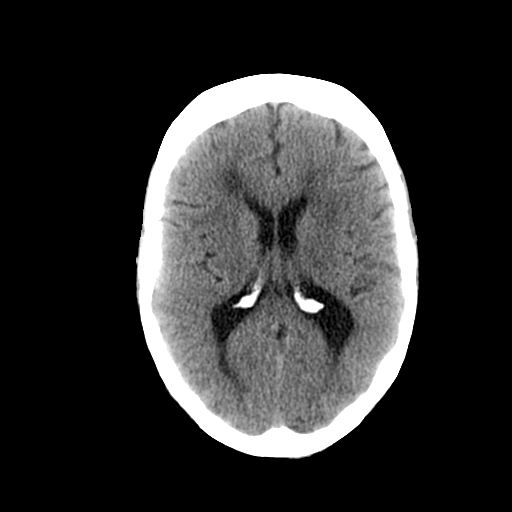
[im 16/30  brain]
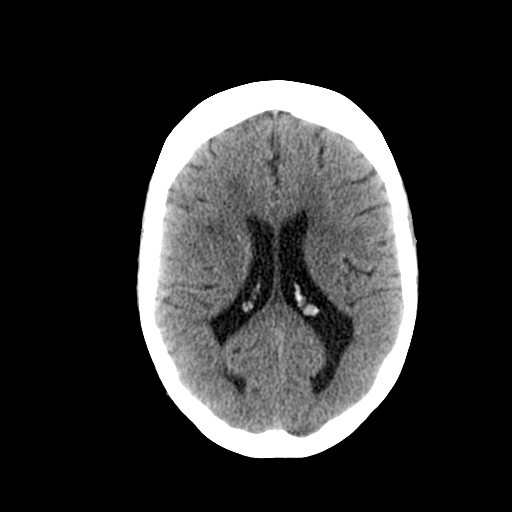
[im 16/30  bone]
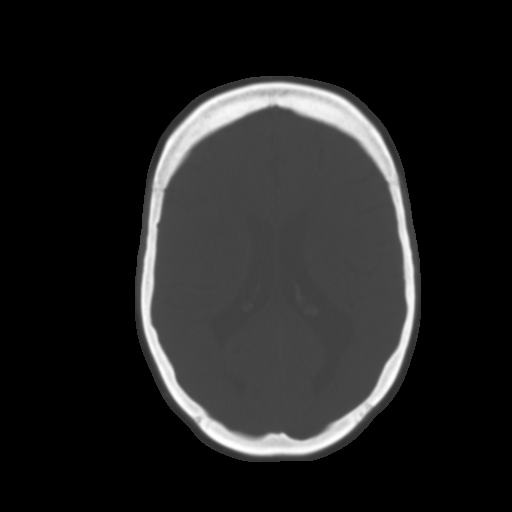
[im 18/30  brain]
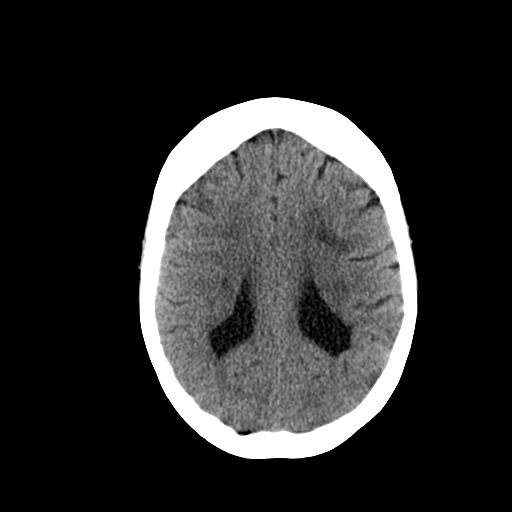
[im 20/30  brain]
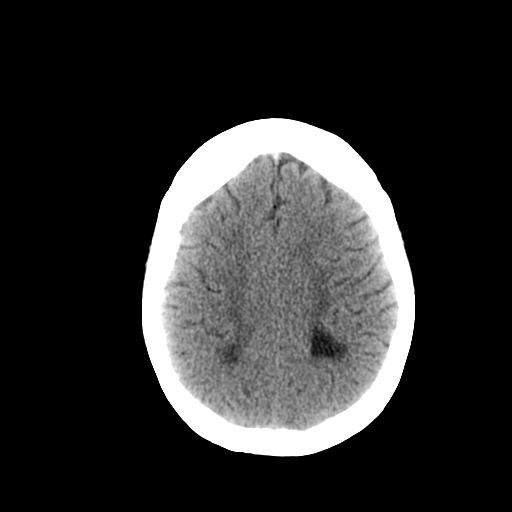
[im 22/30  brain]
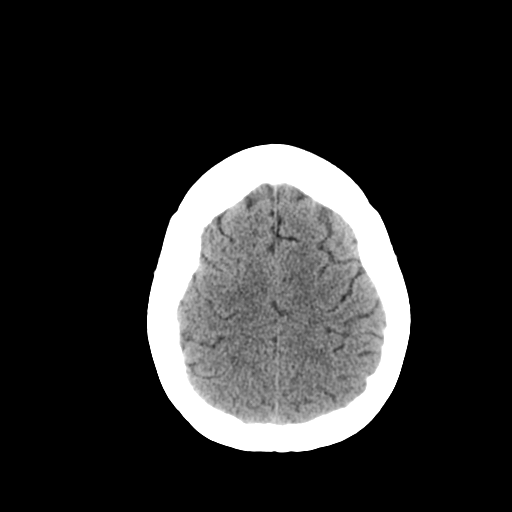
[im 23/30  brain]
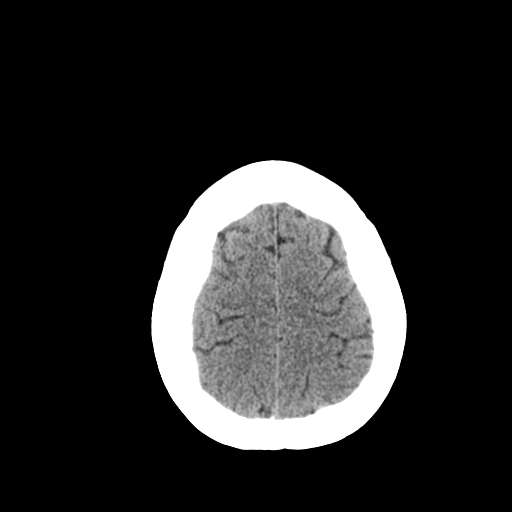
[im 23/30  bone]
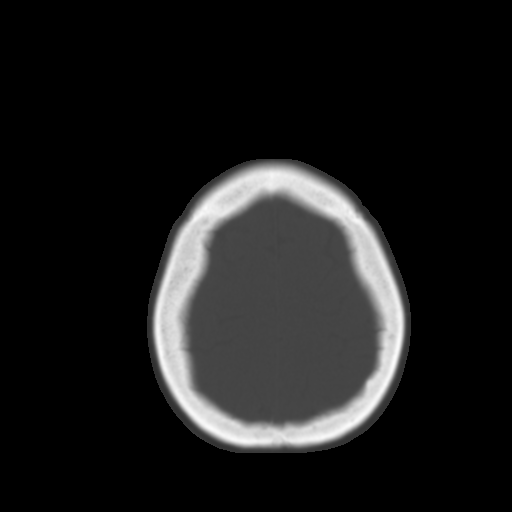
[im 25/30  brain]
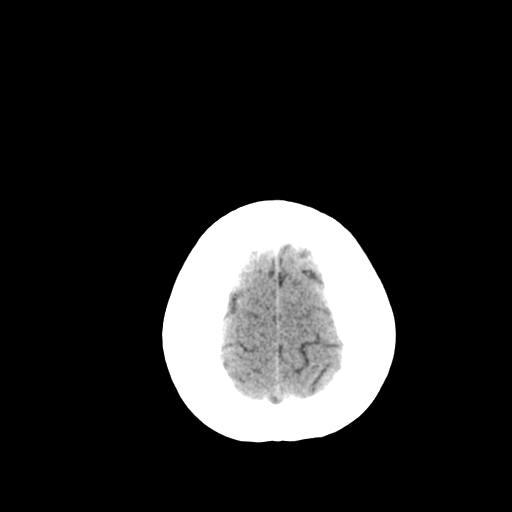
[im 27/30  brain]
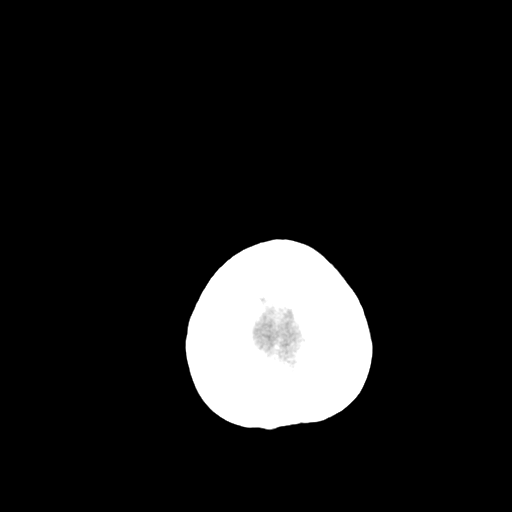
[im 29/30  brain]
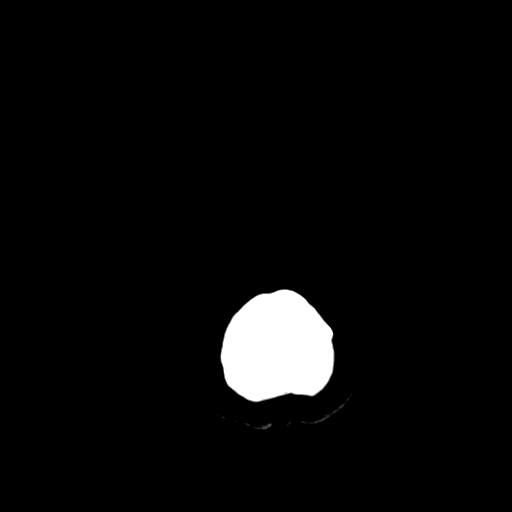

[16 of 30 positions shown; findings below may reference images not displayed]

FINDINGS: No acute intracranial hemorrhage. No focal mass lesion. No CT
evidence of acute infarction. No midline shift or mass effect. No
hydrocephalus. Basilar cisterns are patent. There is periventricular
white matter hypodensities. Subcortical white matter densities are
noted in the centrum semiovale.
IMPRESSION: 1. No acute intracranial findings.
2. Periventricular and subcortical white matter hypodensities are
advanced for age. These likely related to small vessel ischemia but
cannot exclude a demyelinating process.

## 2015-08-22 ENCOUNTER — Encounter (HOSPITAL_COMMUNITY): Payer: Self-pay | Admitting: *Deleted

## 2015-08-22 ENCOUNTER — Emergency Department (HOSPITAL_COMMUNITY): Payer: Self-pay

## 2015-08-22 ENCOUNTER — Emergency Department (HOSPITAL_COMMUNITY)
Admission: EM | Admit: 2015-08-22 | Discharge: 2015-08-22 | Disposition: A | Discharge status: Home / Self Care | Payer: Self-pay | Attending: Emergency Medicine | Admitting: Emergency Medicine

## 2015-08-22 DIAGNOSIS — I10 Essential (primary) hypertension: Secondary | ICD-10-CM | POA: Insufficient documentation

## 2015-08-22 DIAGNOSIS — E119 Type 2 diabetes mellitus without complications: Secondary | ICD-10-CM | POA: Insufficient documentation

## 2015-08-22 DIAGNOSIS — R197 Diarrhea, unspecified: Secondary | ICD-10-CM | POA: Insufficient documentation

## 2015-08-22 DIAGNOSIS — Z79899 Other long term (current) drug therapy: Secondary | ICD-10-CM | POA: Insufficient documentation

## 2015-08-22 DIAGNOSIS — Z794 Long term (current) use of insulin: Secondary | ICD-10-CM | POA: Insufficient documentation

## 2015-08-22 DIAGNOSIS — E7800 Pure hypercholesterolemia, unspecified: Secondary | ICD-10-CM | POA: Insufficient documentation

## 2015-08-22 DIAGNOSIS — R1111 Vomiting without nausea: Secondary | ICD-10-CM | POA: Insufficient documentation

## 2015-08-22 DIAGNOSIS — F17210 Nicotine dependence, cigarettes, uncomplicated: Secondary | ICD-10-CM | POA: Insufficient documentation

## 2015-08-22 DIAGNOSIS — E785 Hyperlipidemia, unspecified: Secondary | ICD-10-CM | POA: Insufficient documentation

## 2015-08-22 DIAGNOSIS — Z7984 Long term (current) use of oral hypoglycemic drugs: Secondary | ICD-10-CM | POA: Insufficient documentation

## 2015-08-22 DIAGNOSIS — F329 Major depressive disorder, single episode, unspecified: Secondary | ICD-10-CM | POA: Insufficient documentation

## 2015-08-22 LAB — CBC WITH DIFFERENTIAL/PLATELET
Basophils Absolute: 0.1 10*3/uL (ref 0.0–0.1)
Basophils Relative: 1 %
Eosinophils Absolute: 0.1 10*3/uL (ref 0.0–0.7)
Eosinophils Relative: 1 %
HCT: 41 % (ref 36.0–46.0)
Hemoglobin: 14.4 g/dL (ref 12.0–15.0)
Lymphocytes Relative: 34 %
Lymphs Abs: 3.1 10*3/uL (ref 0.7–4.0)
MCH: 31.2 pg (ref 26.0–34.0)
MCHC: 35.1 g/dL (ref 30.0–36.0)
MCV: 88.9 fL (ref 78.0–100.0)
Monocytes Absolute: 0.6 10*3/uL (ref 0.1–1.0)
Monocytes Relative: 7 %
Neutro Abs: 5.3 10*3/uL (ref 1.7–7.7)
Neutrophils Relative %: 57 %
Platelets: 246 10*3/uL (ref 150–400)
RBC: 4.61 MIL/uL (ref 3.87–5.11)
RDW: 12.9 % (ref 11.5–15.5)
WBC: 9.2 10*3/uL (ref 4.0–10.5)

## 2015-08-22 LAB — COMPREHENSIVE METABOLIC PANEL
ALT: 14 U/L (ref 14–54)
AST: 20 U/L (ref 15–41)
Albumin: 3.7 g/dL (ref 3.5–5.0)
Alkaline Phosphatase: 110 U/L (ref 38–126)
Anion gap: 11 (ref 5–15)
BUN: 12 mg/dL (ref 6–20)
CO2: 22 mmol/L (ref 22–32)
Calcium: 9.2 mg/dL (ref 8.9–10.3)
Chloride: 106 mmol/L (ref 101–111)
Creatinine, Ser: 0.54 mg/dL (ref 0.44–1.00)
GFR calc Af Amer: >60 mL/min (ref >60)
GFR calc non Af Amer: >60 mL/min (ref >60)
Glucose, Bld: 255 mg/dL — ABNORMAL HIGH (ref 65–99)
Potassium: 3.2 mmol/L — ABNORMAL LOW (ref 3.5–5.1)
Sodium: 139 mmol/L (ref 135–145)
Total Bilirubin: 0.4 mg/dL (ref 0.3–1.2)
Total Protein: 7 g/dL (ref 6.5–8.1)

## 2015-08-22 LAB — URINE MICROSCOPIC-ADD ON

## 2015-08-22 LAB — URINALYSIS, ROUTINE W REFLEX MICROSCOPIC
Bilirubin Urine: NEGATIVE
Glucose, UA: >1000 mg/dL — AB
Hgb urine dipstick: NEGATIVE
Ketones, ur: 40 mg/dL — AB
Leukocytes, UA: NEGATIVE
Nitrite: NEGATIVE
Protein, ur: 30 mg/dL — AB
Specific Gravity, Urine: 1.025 (ref 1.005–1.030)
pH: 5 (ref 5.0–8.0)

## 2015-08-22 LAB — CBG MONITORING, ED: Glucose-Capillary: 271 mg/dL — ABNORMAL HIGH (ref 65–99)

## 2015-08-22 MED ORDER — DICYCLOMINE HCL 20 MG PO TABS
ORAL_TABLET | ORAL | Status: DC
Start: 2015-08-22 — End: 2015-09-13

## 2015-08-22 MED ORDER — POTASSIUM CHLORIDE CRYS ER 20 MEQ PO TBCR
40.0000 meq | EXTENDED_RELEASE_TABLET | Freq: Once | ORAL | Status: AC
  Administered 2015-08-22: 40 meq via ORAL
  Filled 2015-08-22: qty 2

## 2015-08-22 NOTE — Discharge Instructions (Signed)
Follow up with the health department or Dr. Karilyn Cotaehman in 1-2 weeks

## 2015-08-22 NOTE — ED Provider Notes (Signed)
CSN: [id number]     Arrival date & time 08/22/15  4098 History  By signing my name below, I, Phillis Haggis, attest that this documentation has been prepared under the direction and in the presence of Bethann Berkshire, MD. Electronically Signed: Phillis Haggis, ED Scribe. 08/22/2015. 9:04 PM.  Chief Complaint  Patient presents with  . Diarrhea   Patient is a 55 y.o. female presenting with diarrhea. The history is provided by the patient. No language interpreter was used.  Diarrhea Quality:  Unable to specify Severity:  Mild Onset quality:  Gradual Duration:  6 weeks Timing:  Sporadic Progression:  Unchanged Ineffective treatments:  None tried Associated symptoms: abdominal pain and vomiting   Associated symptoms: no headaches   HPI Comments: Kaleena Corrow is a 54 y.o. female with a hx of HTN, DM, and GERD who presents to the Emergency Department complaining of sporadic episodes of diarrhea after eating onset 6 weeks ago. Pt reports associated diffuse abdominal pain and that she is vomiting and urinating on herself. She does not have a PCP due to lack of insurance but is seen at the health department. She has not tried anything for her symptoms. She denies headaches.   Past Medical History  Diagnosis Date  . Diabetes mellitus   . Arthritis   . Hypertension   . Hyperlipidemia   . GERD (gastroesophageal reflux disease)   . Sleep apnea     pt sts does not use "cause i dont think I need it anymore".  . Depression   . Hypercholesterolemia    Past Surgical History  Procedure Laterality Date  . Cholecystectomy    . Incision and drainage abscess Left 12/22/2012    Procedure: INCISION AND DRAINAGE ABSCESS;  Surgeon: Dalia Heading, MD;  Location: [facility];  Service: General;  Laterality: Left;   Family History  Problem Relation Age of Onset  . Hypertension Mother   . CVA Mother   . CAD Father   . Hypercholesterolemia Father    Social History  Substance Use Topics  . Smoking status:  Current Every Day Smoker -- 0.50 packs/day for 40 years    Types: Cigarettes  . Smokeless tobacco: None  . Alcohol Use: No   OB History    No data available     Review of Systems  Constitutional: Negative for appetite change and fatigue.  HENT: Negative for congestion, ear discharge and sinus pressure.   Eyes: Negative for discharge.  Respiratory: Negative for cough.   Cardiovascular: Negative for chest pain.  Gastrointestinal: Positive for vomiting, abdominal pain and diarrhea.  Genitourinary: Positive for urgency and frequency. Negative for hematuria.  Musculoskeletal: Negative for back pain.  Skin: Negative for rash.  Neurological: Negative for seizures and headaches.  Psychiatric/Behavioral: Negative for hallucinations.      Allergies  Cymbalta  Home Medications   Prior to Admission medications   Medication Sig Start Date End Date Taking? Authorizing Provider  acetaminophen (TYLENOL) 500 MG tablet Take 500 mg by mouth every 6 (six) hours as needed for mild pain.    Historical Provider, MD  CRESTOR 20 MG tablet Take 20 mg by mouth at bedtime. 09/19/14   Historical Provider, MD  cyclobenzaprine (FLEXERIL) 10 MG tablet Take 10 mg by mouth daily.     Historical Provider, MD  Fish Oil-Cholecalciferol (FISH OIL + D3 PO) Take 3 capsules by mouth daily.     Historical Provider, MD  glipiZIDE (GLUCOTROL) 10 MG tablet Take 1 tablet (10 mg total) by  mouth 2 (two) times daily before a meal. 01/29/15   Burgess Amor, PA-C  insulin glargine (LANTUS) 100 UNIT/ML injection Inject 10 Units into the skin at bedtime as needed.    Historical Provider, MD  lisinopril-hydrochlorothiazide (PRINZIDE,ZESTORETIC) 20-12.5 MG per tablet Take 1 tablet by mouth daily. 10/29/14   Historical Provider, MD  metFORMIN (GLUCOPHAGE) 1000 MG tablet Take 1 tablet (1,000 mg total) by mouth 2 (two) times daily. 01/29/15   Burgess Amor, PA-C  methocarbamol (ROBAXIN) 500 MG tablet Take 1 tablet (500 mg total) by mouth 2 (two)  times daily. Patient not taking: Reported on 02/25/2015 12/22/14   Garlon Hatchet, PA-C  omeprazole (PRILOSEC) 20 MG capsule Take 40 mg by mouth daily.  09/19/14   Historical Provider, MD  sitaGLIPtin (JANUVIA) 100 MG tablet Take 1 tablet (100 mg total) by mouth daily. 02/25/15   Jacquelin Hawking, PA-C  solifenacin (VESICARE) 10 MG tablet Take 1 tablet (10 mg total) by mouth daily. Patient not taking: Reported on 02/25/2015 01/29/15   Burgess Amor, PA-C   BP 145/91 mmHg  Pulse 93  Temp(Src) 98.5 F (36.9 C) (Oral)  Resp 20  Wt 200 lb (90.719 kg)  SpO2 98% Physical Exam  Constitutional: She is oriented to person, place, and time. She appears well-developed.  HENT:  Head: Normocephalic.  Eyes: Conjunctivae and EOM are normal. No scleral icterus.  Neck: Neck supple. No thyromegaly present.  Cardiovascular: Normal rate and regular rhythm.  Exam reveals no gallop and no friction rub.   No murmur heard. Pulmonary/Chest: No stridor. She has no wheezes. She has no rales. She exhibits no tenderness.  Abdominal: She exhibits no distension. There is tenderness. There is no rebound.  Diffuse mild abdominal tenderness  Musculoskeletal: Normal range of motion. She exhibits no edema.  Lymphadenopathy:    She has no cervical adenopathy.  Neurological: She is oriented to person, place, and time. She exhibits normal muscle tone. Coordination normal.  Skin: No rash noted. No erythema.  Psychiatric: She has a normal mood and affect. Her behavior is normal.    ED Course  Procedures (including critical care time) DIAGNOSTIC STUDIES: Oxygen Saturation is 98% on RA, normal by my interpretation.    COORDINATION OF CARE: 9:03 PM-Discussed treatment plan which includes labs with pt at bedside and pt agreed to plan.    Labs Review Labs Reviewed  URINALYSIS, ROUTINE W REFLEX MICROSCOPIC (NOT AT [facility]) - Abnormal; Notable for the following:    APPearance HAZY (*)    Glucose, UA >1000 (*)    Ketones, ur 40 (*)     Protein, ur 30 (*)    All other components within normal limits  COMPREHENSIVE METABOLIC PANEL - Abnormal; Notable for the following:    Potassium 3.2 (*)    Glucose, Bld 255 (*)    All other components within normal limits  URINE MICROSCOPIC-ADD ON - Abnormal; Notable for the following:    Squamous Epithelial / LPF 6-30 (*)    Bacteria, UA FEW (*)    Casts HYALINE CASTS (*)    All other components within normal limits  CBG MONITORING, ED - Abnormal; Notable for the following:    Glucose-Capillary 271 (*)    All other components within normal limits  CBC WITH DIFFERENTIAL/PLATELET                                Potassium 3.2 (*)    Glucose, Bld  255 (*)    All other components within normal limits  URINE MICROSCOPIC-ADD ON - Abnormal; Notable for the following:    Squamous Epithelial / LPF 6-30 (*)    Bacteria, UA FEW (*)    Casts HYALINE CASTS (*)    All other components within normal limits  CBG MONITORING, ED - Abnormal; Notable for the following:    Glucose-Capillary 271 (*)    All other components within normal limits  CBC WITH DIFFERENTIAL/PLATELET    Imaging Review Dg Abd Acute W/chest  08/22/2015  CLINICAL DATA:  Sporadic episodes of diarrhea beginning 6 weeks ago. Diffuse abdominal pain as well vomiting and difficulty controlling urination. Cough and congestion 3 weeks. EXAM: DG ABDOMEN ACUTE W/ 1V CHEST COMPARISON:  Chest x-ray 11/02/2008 and lumbar spine 08/05/2011 FINDINGS: Lungs are adequately inflated without focal consolidation or effusion. Cardiomediastinal silhouette and remainder of the chest is unchanged. Abdominal pelvic images demonstrate no evidence of free peritoneal air. Air is present throughout the colon. There are a few air-fluid levels over the colon. Surgical clips over the right upper quadrant. Degenerative change of the spine and hips. IMPRESSION: Nonspecific scattered air-fluid levels over the colon. No evidence of obstruction. No acute  cardiopulmonary disease. Electronically Signed   By: Elberta Fortisaniel  Boyle M.D.   On: 08/22/2015 21:37   I have personally reviewed and evaluated these images and lab results as part of my medical decision-making.   EKG Interpretation None      MDM   Final diagnoses:  None   Patient with chronic abdominal cramping and diarrhea for over a year. Labs unremarkable except for hypokalemia and elevated glucose at 255. Abdominal series shows some air-fluid levels. Patient will be referred to GI and given Bentyl for cramping  The chart was scribed for me under my direct supervision.  I personally performed the history, physical, and medical decision making and all procedures in the evaluation of this patient.Bethann Berkshire.     Emori Mumme, MD 08/22/15 2329

## 2015-08-22 NOTE — ED Notes (Signed)
Pt reports having episodes of diarrhea after she eats x 5 months.

## 2015-08-22 NOTE — ED Notes (Signed)
Pt now coming into triage and reporting abdominal pain for 1.5 years.

## 2015-09-13 ENCOUNTER — Encounter (HOSPITAL_COMMUNITY): Payer: Self-pay | Admitting: *Deleted

## 2015-09-13 ENCOUNTER — Emergency Department (HOSPITAL_COMMUNITY)
Admission: EM | Admit: 2015-09-13 | Discharge: 2015-09-14 | Disposition: A | Discharge status: Home / Self Care | Payer: Medicaid Other | Attending: Emergency Medicine | Admitting: Emergency Medicine

## 2015-09-13 DIAGNOSIS — Z91148 Patient's other noncompliance with medication regimen for other reason: Secondary | ICD-10-CM

## 2015-09-13 DIAGNOSIS — L03317 Cellulitis of buttock: Secondary | ICD-10-CM

## 2015-09-13 DIAGNOSIS — Z7984 Long term (current) use of oral hypoglycemic drugs: Secondary | ICD-10-CM | POA: Insufficient documentation

## 2015-09-13 DIAGNOSIS — I10 Essential (primary) hypertension: Secondary | ICD-10-CM | POA: Insufficient documentation

## 2015-09-13 DIAGNOSIS — Z79899 Other long term (current) drug therapy: Secondary | ICD-10-CM | POA: Insufficient documentation

## 2015-09-13 DIAGNOSIS — B372 Candidiasis of skin and nail: Secondary | ICD-10-CM

## 2015-09-13 DIAGNOSIS — Z9114 Patient's other noncompliance with medication regimen: Secondary | ICD-10-CM

## 2015-09-13 DIAGNOSIS — F17210 Nicotine dependence, cigarettes, uncomplicated: Secondary | ICD-10-CM | POA: Insufficient documentation

## 2015-09-13 DIAGNOSIS — Z9111 Patient's noncompliance with dietary regimen: Secondary | ICD-10-CM

## 2015-09-13 DIAGNOSIS — Z91119 Patient's noncompliance with dietary regimen due to unspecified reason: Secondary | ICD-10-CM

## 2015-09-13 DIAGNOSIS — E119 Type 2 diabetes mellitus without complications: Secondary | ICD-10-CM | POA: Insufficient documentation

## 2015-09-13 DIAGNOSIS — Z794 Long term (current) use of insulin: Secondary | ICD-10-CM | POA: Insufficient documentation

## 2015-09-13 DIAGNOSIS — M1990 Unspecified osteoarthritis, unspecified site: Secondary | ICD-10-CM | POA: Insufficient documentation

## 2015-09-13 DIAGNOSIS — R739 Hyperglycemia, unspecified: Secondary | ICD-10-CM

## 2015-09-13 DIAGNOSIS — E785 Hyperlipidemia, unspecified: Secondary | ICD-10-CM | POA: Insufficient documentation

## 2015-09-13 DIAGNOSIS — F329 Major depressive disorder, single episode, unspecified: Secondary | ICD-10-CM | POA: Insufficient documentation

## 2015-09-13 DIAGNOSIS — R21 Rash and other nonspecific skin eruption: Secondary | ICD-10-CM | POA: Insufficient documentation

## 2015-09-13 LAB — CBC WITH DIFFERENTIAL/PLATELET
Basophils Absolute: 0.1 10*3/uL (ref 0.0–0.1)
Basophils Relative: 1 %
Eosinophils Absolute: 0.1 10*3/uL (ref 0.0–0.7)
Eosinophils Relative: 1 %
HCT: 45.2 % (ref 36.0–46.0)
Hemoglobin: 16 g/dL — ABNORMAL HIGH (ref 12.0–15.0)
Lymphocytes Relative: 38 %
Lymphs Abs: 3.8 10*3/uL (ref 0.7–4.0)
MCH: 31.4 pg (ref 26.0–34.0)
MCHC: 35.4 g/dL (ref 30.0–36.0)
MCV: 88.6 fL (ref 78.0–100.0)
Monocytes Absolute: 0.6 10*3/uL (ref 0.1–1.0)
Monocytes Relative: 6 %
Neutro Abs: 5.6 10*3/uL (ref 1.7–7.7)
Neutrophils Relative %: 54 %
Platelets: 280 10*3/uL (ref 150–400)
RBC: 5.1 MIL/uL (ref 3.87–5.11)
RDW: 13.1 % (ref 11.5–15.5)
WBC: 10.1 10*3/uL (ref 4.0–10.5)

## 2015-09-13 LAB — BASIC METABOLIC PANEL
Anion gap: 13 (ref 5–15)
BUN: 29 mg/dL — ABNORMAL HIGH (ref 6–20)
CO2: 25 mmol/L (ref 22–32)
Calcium: 10.5 mg/dL — ABNORMAL HIGH (ref 8.9–10.3)
Chloride: 94 mmol/L — ABNORMAL LOW (ref 101–111)
Creatinine, Ser: 0.63 mg/dL (ref 0.44–1.00)
GFR calc Af Amer: >60 mL/min (ref >60)
GFR calc non Af Amer: >60 mL/min (ref >60)
Glucose, Bld: 498 mg/dL — ABNORMAL HIGH (ref 65–99)
Potassium: 4.3 mmol/L (ref 3.5–5.1)
Sodium: 132 mmol/L — ABNORMAL LOW (ref 135–145)

## 2015-09-13 LAB — CBG MONITORING, ED
Glucose-Capillary: 526 mg/dL — ABNORMAL HIGH (ref 65–99)
Glucose-Capillary: 468 mg/dL — ABNORMAL HIGH (ref 65–99)

## 2015-09-13 MED ORDER — GLIPIZIDE 10 MG PO TABS: 10.0000 mg | ORAL_TABLET | Freq: Two times a day (BID) | ORAL | Status: DC

## 2015-09-13 MED ORDER — DOXYCYCLINE HYCLATE 100 MG PO TABS: 100.0000 mg | ORAL_TABLET | Freq: Two times a day (BID) | ORAL | Status: DC

## 2015-09-13 MED ORDER — INSULIN ASPART 100 UNIT/ML ~~LOC~~ SOLN
10.0000 [IU] | Freq: Once | SUBCUTANEOUS | Status: AC
  Administered 2015-09-13: 10 [IU] via SUBCUTANEOUS
  Filled 2015-09-13 (×2): qty 1

## 2015-09-13 MED ORDER — SITAGLIPTIN PHOSPHATE 100 MG PO TABS
100.0000 mg | ORAL_TABLET | Freq: Every day | ORAL | Status: DC
Start: 2015-09-13 — End: 2016-04-19

## 2015-09-13 MED ORDER — METFORMIN HCL 1000 MG PO TABS: 1000.0000 mg | ORAL_TABLET | Freq: Two times a day (BID) | ORAL | Status: DC

## 2015-09-13 MED ORDER — SODIUM CHLORIDE 0.9 % IV BOLUS (SEPSIS)
1000.0000 mL | Freq: Once | INTRAVENOUS | Status: AC
Start: 2015-09-13 — End: 2015-09-13
  Administered 2015-09-13: 1000 mL via INTRAVENOUS

## 2015-09-13 MED ORDER — SODIUM CHLORIDE 0.9 % IV BOLUS (SEPSIS)
1000.0000 mL | Freq: Once | INTRAVENOUS | Status: AC
  Administered 2015-09-13: 1000 mL via INTRAVENOUS

## 2015-09-13 MED ORDER — CLOTRIMAZOLE 1 % EX CREA: TOPICAL_CREAM | CUTANEOUS | Status: DC

## 2015-09-13 NOTE — Discharge Instructions (Signed)
Take the prescriptions as directed. Follow the diabetic diet. Call your regular medical doctor on Monday to schedule a follow up appointment within the next 3 days.  Return to the Emergency Department immediately sooner if worsening.

## 2015-09-13 NOTE — ED Notes (Addendum)
Pt reports having "boils" on her vagina that are painful and itchy x 1 week. Pt also reports having a "boil" near her rectum.

## 2015-09-13 NOTE — ED Notes (Signed)
MD Clarene DukeMcManus notified of CBG of 523 at this time. MD at bedside.

## 2015-09-13 NOTE — ED Provider Notes (Signed)
CSN: [id number]     Arrival date & time 09/13/15  2044 History   First MD Initiated Contact with Patient 09/13/15 2136     Chief Complaint  Patient presents with  . Vaginal Pain      HPI Pt was seen at 2150. Per pt, c/o gradual onset and persistence of constant "rash" on her perineal area for the past 1 to 2 weeks. Describes the rash as "itchy." Denies blisters, no open wounds, no injury, no fevers, no vaginal discharge, no dysuria.    Past Medical History  Diagnosis Date  . Diabetes mellitus   . Arthritis   . Hypertension   . Hyperlipidemia   . GERD (gastroesophageal reflux disease)   . Sleep apnea     pt sts does not use "cause i dont think I need it anymore".  . Depression   . Hypercholesterolemia    Past Surgical History  Procedure Laterality Date  . Cholecystectomy    . Incision and drainage abscess Left 12/22/2012    Procedure: INCISION AND DRAINAGE ABSCESS;  Surgeon: Dalia HeadingMark A Jenkins, MD;  Location: [facility];  Service: General;  Laterality: Left;   Family History  Problem Relation Age of Onset  . Hypertension Mother   . CVA Mother   . CAD Father   . Hypercholesterolemia Father    Social History  Substance Use Topics  . Smoking status: Current Every Day Smoker -- 0.50 packs/day for 40 years    Types: Cigarettes  . Smokeless tobacco: None  . Alcohol Use: No    Review of Systems ROS: Statement: All systems negative except as marked or noted in the HPI; Constitutional: Negative for fever and chills. ; ; Eyes: Negative for eye pain, redness and discharge. ; ; ENMT: Negative for ear pain, hoarseness, nasal congestion, sinus pressure and sore throat. ; ; Cardiovascular: Negative for chest pain, palpitations, diaphoresis, dyspnea and peripheral edema. ; ; Respiratory: Negative for cough, wheezing and stridor. ; ; Gastrointestinal: Negative for nausea, vomiting, diarrhea, abdominal pain, blood in stool, hematemesis, jaundice and rectal bleeding. . ; ; Genitourinary: Negative  for dysuria, flank pain and hematuria. ; ; Musculoskeletal: Negative for back pain and neck pain. Negative for swelling and trauma.; ; Skin: +rash. Negative for pruritus, abrasions, blisters, bruising and skin lesion.; ; Neuro: Negative for headache, lightheadedness and neck stiffness. Negative for weakness, altered level of consciousness , altered mental status, extremity weakness, paresthesias, involuntary movement, seizure and syncope.      Allergies  Cymbalta  Home Medications   Prior to Admission medications   Medication Sig Start Date End Date Taking? Authorizing Provider  glipiZIDE (GLUCOTROL) 10 MG tablet Take 1 tablet (10 mg total) by mouth 2 (two) times daily before a meal. 01/29/15  Yes Burgess AmorJulie Idol, PA-C  insulin glargine (LANTUS) 100 UNIT/ML injection Inject 10 Units into the skin daily as needed (for high blood sugars).    Yes Historical Provider, MD  lisinopril-hydrochlorothiazide (PRINZIDE,ZESTORETIC) 20-12.5 MG per tablet Take 1 tablet by mouth daily. 10/29/14  Yes Historical Provider, MD  lovastatin (MEVACOR) 20 MG tablet Take 20 mg by mouth daily.   Yes Historical Provider, MD  metFORMIN (GLUCOPHAGE) 1000 MG tablet Take 1 tablet (1,000 mg total) by mouth 2 (two) times daily. 01/29/15  Yes Raynelle FanningJulie Idol, PA-C  NON FORMULARY Take 1 tablet by mouth daily. *Urinary Incontinence Relief*   Yes Historical Provider, MD  omeprazole (PRILOSEC) 20 MG capsule Take 40 mg by mouth daily.  09/19/14  Yes Historical Provider,  MD  sitaGLIPtin (JANUVIA) 100 MG tablet Take 1 tablet (100 mg total) by mouth daily. 02/25/15  Yes Shannon McElroy, PA-C   BP 149/107 mmHg  Pulse 103  Temp(Src) 97.4 F (36.3 C) (Temporal)  Resp 16  Ht  (1.6 m)  Wt 192 lb (87.091 kg)  BMI 34.02 kg/m2  SpO2 97% Physical Exam  2155: Physical examination:  Nursing notes reviewed; Vital signs and O2 SAT reviewed;  Constitutional: Well developed, Well nourished, Well hydrated, In no acute distress; Head:  Normocephalic,  atraumatic; Eyes: EOMI, PERRL, No scleral icterus; ENMT: Mouth and pharynx normal, Mucous membranes moist; Neck: Supple, Full range of motion, No lymphadenopathy; Cardiovascular: Regular rate and rhythm, No gallop; Respiratory: Breath sounds clear & equal bilaterally, No wheezes.  Speaking full sentences with ease, Normal respiratory effort/excursion; Chest: Nontender, Movement normal; Abdomen: Soft, Nontender, Nondistended, Normal bowel sounds; Genitourinary: No CVA tenderness. Perineal exam performed with permission of pt and female ED RN assist during exam.  Entire external genitalia from mons to anus erythematous with satellite lesions c/w yeast infection, no blisters, no open wounds. No vaginal discharge. Distal left medial buttock: one small area with palpable induration, no fluctuance, no central pointing area. No perineal ecchymosis, no soft tissue crepitus. No drainage from anus.; Extremities: Pulses normal, No tenderness, No edema, No calf edema or asymmetry.; Neuro: AA&Ox3, Major CN grossly intact.  Speech clear. No gross focal motor or sensory deficits in extremities. Climbs on and off stretcher easily by herself. Gait steady.; Skin: Color normal, Warm, Dry.   ED Course  Procedures (including critical care time) Labs Review  Imaging Review  I have personally reviewed and evaluated these images and lab results as part of my medical decision-making.   EKG Interpretation None      MDM  MDM Reviewed: previous chart, nursing note and vitals   Results for orders placed or performed during the hospital encounter of 09/13/15  CBG monitoring, ED  Result Value Ref Range   Glucose-Capillary 526 (H) 65 - 99 mg/dL    Results for orders placed or performed during the hospital encounter of 09/13/15  CBC with Differential  Result Value Ref Range   WBC 10.1 4.0 - 10.5 K/uL   RBC 5.10 3.87 - 5.11 MIL/uL   Hemoglobin 16.0 (H) 12.0 - 15.0 g/dL   HCT 16.1 09.6 - 04.5 %   MCV 88.6 78.0 -  100.0 fL   MCH 31.4 26.0 - 34.0 pg   MCHC 35.4 30.0 - 36.0 g/dL   RDW 40.9 81.1 - 91.4 %   Platelets 280 150 - 400 K/uL   Neutrophils Relative % 54 %   Neutro Abs 5.6 1.7 - 7.7 K/uL   Lymphocytes Relative 38 %   Lymphs Abs 3.8 0.7 - 4.0 K/uL   Monocytes Relative 6 %   Monocytes Absolute 0.6 0.1 - 1.0 K/uL   Eosinophils Relative 1 %   Eosinophils Absolute 0.1 0.0 - 0.7 K/uL   Basophils Relative 1 %   Basophils Absolute 0.1 0.0 - 0.1 K/uL  Basic metabolic panel  Result Value Ref Range   Sodium 132 (L) 135 - 145 mmol/L   Potassium 4.3 3.5 - 5.1 mmol/L   Chloride 94 (L) 101 - 111 mmol/L   CO2 25 22 - 32 mmol/L   Glucose, Bld 498 (H) 65 - 99 mg/dL   BUN 29 (H) 6 - 20 mg/dL   Creatinine, Ser 7.82 0.44 - 1.00 mg/dL   Calcium 95.6 (H) 8.9 -  10.3 mg/dL   GFR calc non Af Amer >60 >60 mL/min   GFR calc Af Amer >60 >60 mL/min   Anion gap 13 5 - 15  CBG monitoring, ED  Result Value Ref Range   Glucose-Capillary 526 (H) 65 - 99 mg/dL  CBG monitoring, ED  Result Value Ref Range   Glucose-Capillary 468 (H) 65 - 99 mg/dL  CBG monitoring, ED  Result Value Ref Range   Glucose-Capillary 318 (H) 65 - 99 mg/dL     4132:  Rash on perineal area c/w yeast. Small area of palp induration; will tx with abx. CBG elevated; pt states "it's always like that." Agreeable to tx IVF and SQ insulin while labs are checked.   2330:  CBG trending downward with IVF and SQ insulin. Will give 2nd liter IV NS. Pt not acidotic with AG 13. States she "hasn't taken her insulin" in "at least" 3 or more months. Will rx her PO DM meds today and give handout on DM diet (stated to ED RN she "doesn't know what to eat for her diabetes").   0030:  CBG trending downward. VS remain stable. Pt states she wants to go home now. Pt strongly encouraged to take her MD meds, follow DM diet, and f/u with PMD on Monday; pt verb understanding. Dx and testing d/w pt and family.  Questions answered.  Verb understanding, agreeable to d/c  home with outpt f/u.   Samuel Jester, DO 09/17/15 1409

## 2015-09-14 LAB — CBG MONITORING, ED: Glucose-Capillary: 318 mg/dL — ABNORMAL HIGH (ref 65–99)

## 2015-09-14 NOTE — ED Notes (Signed)
MD Clarene DukeMcManus notified of CBG of 468 at this time.

## 2015-09-14 NOTE — ED Notes (Signed)
Pt and spouse verbalized discharge instructions and follow up care. Pt verbalized importance of checking blood sugar and taking insulin. Diabetic diet review and pt stated understanding. Prescriptions reviewed at this time also.

## 2015-09-14 NOTE — ED Notes (Signed)
MD Clarene DukeMcManus notified of CBG of 318 at this time.

## 2015-10-10 ENCOUNTER — Encounter (HOSPITAL_COMMUNITY): Payer: Self-pay | Admitting: Emergency Medicine

## 2015-10-10 ENCOUNTER — Emergency Department (HOSPITAL_COMMUNITY)
Admission: EM | Admit: 2015-10-10 | Discharge: 2015-10-10 | Disposition: A | Discharge status: Home / Self Care | Payer: Medicaid Other | Attending: Emergency Medicine | Admitting: Emergency Medicine

## 2015-10-10 ENCOUNTER — Other Ambulatory Visit (HOSPITAL_COMMUNITY): Payer: Self-pay | Admitting: *Deleted

## 2015-10-10 DIAGNOSIS — I10 Essential (primary) hypertension: Secondary | ICD-10-CM | POA: Insufficient documentation

## 2015-10-10 DIAGNOSIS — L02215 Cutaneous abscess of perineum: Secondary | ICD-10-CM

## 2015-10-10 DIAGNOSIS — F17210 Nicotine dependence, cigarettes, uncomplicated: Secondary | ICD-10-CM | POA: Insufficient documentation

## 2015-10-10 DIAGNOSIS — E785 Hyperlipidemia, unspecified: Secondary | ICD-10-CM | POA: Insufficient documentation

## 2015-10-10 DIAGNOSIS — E119 Type 2 diabetes mellitus without complications: Secondary | ICD-10-CM | POA: Insufficient documentation

## 2015-10-10 DIAGNOSIS — M1990 Unspecified osteoarthritis, unspecified site: Secondary | ICD-10-CM | POA: Insufficient documentation

## 2015-10-10 DIAGNOSIS — Z794 Long term (current) use of insulin: Secondary | ICD-10-CM | POA: Insufficient documentation

## 2015-10-10 DIAGNOSIS — F329 Major depressive disorder, single episode, unspecified: Secondary | ICD-10-CM | POA: Insufficient documentation

## 2015-10-10 DIAGNOSIS — Z7984 Long term (current) use of oral hypoglycemic drugs: Secondary | ICD-10-CM | POA: Insufficient documentation

## 2015-10-10 LAB — CBG MONITORING, ED: Glucose-Capillary: 236 mg/dL — ABNORMAL HIGH (ref 65–99)

## 2015-10-10 MED ORDER — LIDOCAINE HCL (PF) 1 % IJ SOLN
INTRAMUSCULAR | Status: AC
  Filled 2015-10-10: qty 5

## 2015-10-10 MED ORDER — SULFAMETHOXAZOLE-TRIMETHOPRIM 800-160 MG PO TABS: 1.0000 | ORAL_TABLET | Freq: Two times a day (BID) | ORAL | Status: AC

## 2015-10-10 MED ORDER — LIDOCAINE HCL (PF) 1 % IJ SOLN: 5.0000 mL | Freq: Once | INTRAMUSCULAR | Status: DC

## 2015-10-10 MED ORDER — LIDOCAINE HCL (PF) 1 % IJ SOLN
5.0000 mL | Freq: Once | INTRAMUSCULAR | Status: DC
  Filled 2015-10-10: qty 5

## 2015-10-10 MED ORDER — HYDROCODONE-ACETAMINOPHEN 5-325 MG PO TABS: 2.0000 | ORAL_TABLET | ORAL | Status: DC | PRN

## 2015-10-10 MED ORDER — SULFAMETHOXAZOLE-TRIMETHOPRIM 800-160 MG PO TABS
1.0000 | ORAL_TABLET | Freq: Once | ORAL | Status: AC
  Administered 2015-10-10: 1 via ORAL
  Filled 2015-10-10: qty 1

## 2015-10-10 NOTE — Discharge Instructions (Signed)

## 2015-10-10 NOTE — ED Notes (Signed)
Called for triage with no response from waiting room or parking lot

## 2015-10-10 NOTE — ED Notes (Signed)
Called for triage, no response from waiting room or parking lot

## 2015-10-10 NOTE — ED Notes (Addendum)
Patient complaining of abscess on left buttock since this morning. CBG - 236 in triage.

## 2015-10-12 NOTE — ED Provider Notes (Signed)
CSN: [id number]     Arrival date & time 10/10/15  2024 History   First MD Initiated Contact with Patient 10/10/15 2135     Chief Complaint  Patient presents with  . Abscess     (Consider location/radiation/quality/duration/timing/severity/associated sxs/prior Treatment) Patient is a 55 y.o. female presenting with abscess. The history is provided by the patient.  Abscess Location:  Ano-genital Ano-genital abscess location: left medial buttock and perineum involvement. Size:  2 cm Abscess quality: fluctuance, induration, painful and redness   Abscess quality: not draining   Red streaking: no   Duration:  1 day Progression:  Worsening Pain details:    Quality:  Posey   Severity:  Moderate   Duration:  1 day (she reports a small pimple at the site for several days which quickly enlarged today)   Timing:  Constant   Progression:  Worsening Context: diabetes   Relieved by:  Nothing Ineffective treatments:  Warm compresses Associated symptoms: no fatigue, no fever and no nausea     Past Medical History  Diagnosis Date  . Diabetes mellitus   . Arthritis   . Hypertension   . Hyperlipidemia   . GERD (gastroesophageal reflux disease)   . Sleep apnea     pt sts does not use "cause i dont think I need it anymore".  . Depression   . Hypercholesterolemia    Past Surgical History  Procedure Laterality Date  . Cholecystectomy    . Incision and drainage abscess Left 12/22/2012    Procedure: INCISION AND DRAINAGE ABSCESS;  Surgeon: Dalia Heading, MD;  Location: [facility];  Service: General;  Laterality: Left;   Family History  Problem Relation Age of Onset  . Hypertension Mother   . CVA Mother   . CAD Father   . Hypercholesterolemia Father    Social History  Substance Use Topics  . Smoking status: Current Every Day Smoker -- 0.50 packs/day for 40 years    Types: Cigarettes  . Smokeless tobacco: None  . Alcohol Use: No   OB History    No data available     Review of  Systems  Constitutional: Negative for fever, chills and fatigue.  Respiratory: Negative for shortness of breath and wheezing.   Gastrointestinal: Negative for nausea.  Skin:       Negative except as mentioned in HPI.    Neurological: Negative for numbness.      Allergies  Cymbalta  Home Medications   Prior to Admission medications   Medication Sig Start Date End Date Taking? Authorizing Provider  clotrimazole (LOTRIMIN) 1 % cream Apply to affected area 2 times daily for the next 4 weeks 09/13/15   Samuel Jester, DO  doxycycline (VIBRA-TABS) 100 MG tablet Take 1 tablet (100 mg total) by mouth 2 (two) times daily. 09/13/15   Samuel Jester, DO  glipiZIDE (GLUCOTROL) 10 MG tablet Take 1 tablet (10 mg total) by mouth 2 (two) times daily before a meal. 01/29/15   Burgess Amor, PA-C  glipiZIDE (GLUCOTROL) 10 MG tablet Take 1 tablet (10 mg total) by mouth 2 (two) times daily before a meal. 09/13/15   Samuel Jester, DO  HYDROcodone-acetaminophen (NORCO/VICODIN) 5-325 MG tablet Take 2 tablets by mouth every 4 (four) hours as needed. 10/10/15   Burgess Amor, PA-C  insulin glargine (LANTUS) 100 UNIT/ML injection Inject 10 Units into the skin daily as needed (for high blood sugars).     Historical Provider, MD  lisinopril-hydrochlorothiazide (PRINZIDE,ZESTORETIC) 20-12.5 MG per tablet Take 1 tablet  by mouth daily. 10/29/14   Historical Provider, MD  lovastatin (MEVACOR) 20 MG tablet Take 20 mg by mouth daily.    Historical Provider, MD  metFORMIN (GLUCOPHAGE) 1000 MG tablet Take 1 tablet (1,000 mg total) by mouth 2 (two) times daily. 01/29/15   Burgess AmorJulie Onur Mori, PA-C  metFORMIN (GLUCOPHAGE) 1000 MG tablet Take 1 tablet (1,000 mg total) by mouth 2 (two) times daily. 09/13/15   Samuel JesterKathleen McManus, DO  NON FORMULARY Take 1 tablet by mouth daily. *Urinary Incontinence Relief*    Historical Provider, MD  omeprazole (PRILOSEC) 20 MG capsule Take 40 mg by mouth daily.  09/19/14   Historical Provider, MD  sitaGLIPtin  (JANUVIA) 100 MG tablet Take 1 tablet (100 mg total) by mouth daily. 02/25/15   Jacquelin HawkingShannon McElroy, PA-C  sitaGLIPtin (JANUVIA) 100 MG tablet Take 1 tablet (100 mg total) by mouth daily. 09/13/15   Samuel JesterKathleen McManus, DO  sulfamethoxazole-trimethoprim (BACTRIM DS,SEPTRA DS) 800-160 MG tablet Take 1 tablet by mouth 2 (two) times daily. 10/10/15 10/17/15  Burgess AmorJulie Monaca Wadas, PA-C   BP 168/88 mmHg  Pulse 90  Temp(Src) 98 F (36.7 C) (Oral)  Resp 20  Ht 5\' 2"  (1.575 m)  Wt 89.359 kg  BMI 36.02 kg/m2  SpO2 98% Physical Exam  Constitutional: She is oriented to person, place, and time. She appears well-developed and well-nourished.  HENT:  Head: Normocephalic.  Cardiovascular: Normal rate.   Pulmonary/Chest: Effort normal.  Neurological: She is alert and oriented to person, place, and time. No sensory deficit.  Skin:  1 cm raised fluctuant abscess surrounding 2 cm indurated redness. No drainage.    ED Course  .Marland Kitchen.Incision and Drainage Date/Time: 10/10/2015 10:30 PM Performed by: Burgess AmorIDOL, Domitila Stetler Authorized by: Burgess AmorIDOL, Dennie Vecchio Consent: Verbal consent obtained. Risks and benefits: risks, benefits and alternatives were discussed Consent given by: patient Patient identity confirmed: verbally with patient Time out: Immediately prior to procedure a "time out" was called to verify the correct patient, procedure, equipment, support staff and site/side marked as required. Type: abscess Body area: anogenital Location details: perineum Anesthesia: local infiltration Local anesthetic: lidocaine 1% without epinephrine Anesthetic total: 6 ml Scalpel size: 11 Incision type: single straight Incision depth: subcutaneous Complexity: complex Drainage: purulent Drainage amount: copious Wound treatment: wound left open Comments: There were no immediate complications.  Pt did not tolerate the procedure well. It was difficult to obtain local anesthesia.  Copious purulent followed by bloody drainage.  I would have preferred to  open more with more flushing but pt would not tolerate.   (including critical care time) Labs Review Labs Reviewed  CBG MONITORING, ED - Abnormal; Notable for the following:    Glucose-Capillary 236 (*)    All other components within normal limits    Imaging Review No results found. I have personally reviewed and evaluated these images and lab results as part of my medical decision-making.   EKG Interpretation None      MDM   Final diagnoses:  Abscess of perineum    Advised warm soaks, starting tonight, bid. Bactrim, hydrocodone.  Close watch on cbg's. Advised f/u with pcp or return here for recheck and for recheck for any worsened sx.    Burgess AmorJulie Almando Brawley, PA-C 10/12/15 1337  Raeford RazorStephen Kohut, MD 10/16/15 [phone]

## 2015-10-14 MED FILL — Hydrocodone-Acetaminophen Tab 5-325 MG: ORAL | Qty: 6 | Status: AC

## 2015-10-15 ENCOUNTER — Other Ambulatory Visit: Payer: Self-pay | Admitting: Physician Assistant

## 2016-01-13 ENCOUNTER — Other Ambulatory Visit (HOSPITAL_COMMUNITY): Payer: Self-pay | Admitting: *Deleted

## 2016-01-13 DIAGNOSIS — Z1231 Encounter for screening mammogram for malignant neoplasm of breast: Secondary | ICD-10-CM

## 2016-01-21 ENCOUNTER — Inpatient Hospital Stay (HOSPITAL_COMMUNITY): Admission: RE | Admit: 2016-01-21 | Payer: Medicaid Other | Source: Ambulatory Visit

## 2016-01-23 ENCOUNTER — Ambulatory Visit (HOSPITAL_COMMUNITY)
Admission: RE | Admit: 2016-01-23 | Discharge: 2016-01-23 | Disposition: A | Discharge status: Home / Self Care | Payer: PRIVATE HEALTH INSURANCE | Source: Ambulatory Visit | Attending: *Deleted | Admitting: *Deleted

## 2016-01-23 DIAGNOSIS — Z1231 Encounter for screening mammogram for malignant neoplasm of breast: Secondary | ICD-10-CM | POA: Insufficient documentation

## 2016-01-28 ENCOUNTER — Emergency Department (HOSPITAL_COMMUNITY)
Admission: EM | Admit: 2016-01-28 | Discharge: 2016-01-28 | Disposition: A | Discharge status: Home / Self Care | Payer: Medicaid Other | Attending: Emergency Medicine | Admitting: Emergency Medicine

## 2016-01-28 ENCOUNTER — Encounter (HOSPITAL_COMMUNITY): Payer: Self-pay | Admitting: *Deleted

## 2016-01-28 DIAGNOSIS — E119 Type 2 diabetes mellitus without complications: Secondary | ICD-10-CM | POA: Insufficient documentation

## 2016-01-28 DIAGNOSIS — Z7984 Long term (current) use of oral hypoglycemic drugs: Secondary | ICD-10-CM | POA: Insufficient documentation

## 2016-01-28 DIAGNOSIS — F17210 Nicotine dependence, cigarettes, uncomplicated: Secondary | ICD-10-CM | POA: Insufficient documentation

## 2016-01-28 DIAGNOSIS — N760 Acute vaginitis: Secondary | ICD-10-CM | POA: Insufficient documentation

## 2016-01-28 DIAGNOSIS — Z79899 Other long term (current) drug therapy: Secondary | ICD-10-CM | POA: Insufficient documentation

## 2016-01-28 DIAGNOSIS — I10 Essential (primary) hypertension: Secondary | ICD-10-CM | POA: Insufficient documentation

## 2016-01-28 DIAGNOSIS — L0291 Cutaneous abscess, unspecified: Secondary | ICD-10-CM

## 2016-01-28 DIAGNOSIS — Z794 Long term (current) use of insulin: Secondary | ICD-10-CM | POA: Insufficient documentation

## 2016-01-28 MED ORDER — IBUPROFEN 800 MG PO TABS
800.0000 mg | ORAL_TABLET | Freq: Once | ORAL | Status: AC
  Administered 2016-01-28: 800 mg via ORAL
  Filled 2016-01-28: qty 1

## 2016-01-28 MED ORDER — OXYCODONE-ACETAMINOPHEN 5-325 MG PO TABS: 1.0000 | ORAL_TABLET | Freq: Four times a day (QID) | ORAL | 0 refills | Status: DC | PRN

## 2016-01-28 MED ORDER — DOXYCYCLINE HYCLATE 100 MG PO TABS
100.0000 mg | ORAL_TABLET | Freq: Once | ORAL | Status: AC
  Administered 2016-01-28: 100 mg via ORAL
  Filled 2016-01-28: qty 1

## 2016-01-28 MED ORDER — ACETAMINOPHEN 500 MG PO TABS
1000.0000 mg | ORAL_TABLET | Freq: Once | ORAL | Status: AC
  Administered 2016-01-28: 1000 mg via ORAL
  Filled 2016-01-28: qty 2

## 2016-01-28 MED ORDER — CLINDAMYCIN HCL 150 MG PO CAPS: ORAL_CAPSULE | ORAL | 0 refills | Status: DC

## 2016-01-28 NOTE — ED Provider Notes (Signed)
[facility] Provider Note   CSN: [id number] Arrival date & time: 01/28/16  0846     History   Chief Complaint Chief Complaint  Patient presents with  . Abscess    HPI Sarah Charles is a 55 y.o. female.  The history is provided by the patient.  Abscess  Location:  Pelvis Pelvic abscess location:  Vagina Abscess quality: draining and painful   Red streaking: no   Duration:  1 day Progression:  Worsening Pain details:    Quality:  Pressure and throbbing   Severity:  Moderate   Duration:  1 day   Timing:  Constant   Progression:  Worsening Chronicity:  Recurrent Context: diabetes   Relieved by:  Nothing Worsened by:  Nothing Ineffective treatments:  None tried Associated symptoms: no anorexia, no fever, no nausea and no vomiting   Risk factors: prior abscess   Risk factors: no hx of MRSA     Past Medical History:  Diagnosis Date  . Arthritis   . Depression   . Diabetes mellitus   . GERD (gastroesophageal reflux disease)   . Hypercholesterolemia   . Hyperlipidemia   . Hypertension   . Sleep apnea    pt sts does not use "cause i dont think I need it anymore".    Patient Active Problem List   Diagnosis Date Noted  . Type 2 diabetes mellitus with complication (HCC) 04/20/2015  . Cellulitis of breast 12/29/2013  . Diabetes mellitus (HCC) 12/29/2013  . Cellulitis of female breast 12/29/2013  . Symptomatic menopausal or female climacteric states 04/08/2008  . OTHER SPECIFIED DISEASE OF NAIL 04/08/2008  . URINARY URGENCY 04/08/2008  . COUGH 04/02/2008  . HYPERTENSION, BENIGN ESSENTIAL 04/12/2007  . FATIGUE 04/12/2007  . OBESITY, NOS 07/21/2006  . DEPRESSION, MAJOR, RECURRENT 07/21/2006  . TOBACCO DEPENDENCE 07/21/2006  . GASTROESOPHAGEAL REFLUX, NO ESOPHAGITIS 07/21/2006  . INCONTINENCE, STRESS, FEMALE 07/21/2006    Past Surgical History:  Procedure Laterality Date  . CHOLECYSTECTOMY    . INCISION AND DRAINAGE ABSCESS Left 12/22/2012   Procedure:  INCISION AND DRAINAGE ABSCESS;  Surgeon: Dalia HeadingMark A Jenkins, MD;  Location: [facility];  Service: General;  Laterality: Left;    OB History    No data available       Home Medications    Prior to Admission medications   Medication Sig Start Date End Date Taking? Authorizing Provider  glipiZIDE (GLUCOTROL) 10 MG tablet Take 1 tablet (10 mg total) by mouth 2 (two) times daily before a meal. 09/13/15  Yes Samuel JesterKathleen McManus, DO  HYDROcodone-acetaminophen (NORCO/VICODIN) 5-325 MG tablet Take 2 tablets by mouth every 4 (four) hours as needed. 10/10/15  Yes Raynelle FanningJulie Idol, PA-C  insulin glargine (LANTUS) 100 UNIT/ML injection Inject 10 Units into the skin daily as needed (for high blood sugars).    Yes Historical Provider, MD  lisinopril-hydrochlorothiazide (PRINZIDE,ZESTORETIC) 20-12.5 MG per tablet Take 1 tablet by mouth daily. 10/29/14  Yes Historical Provider, MD  lovastatin (MEVACOR) 20 MG tablet Take 20 mg by mouth daily.   Yes Historical Provider, MD  metFORMIN (GLUCOPHAGE) 1000 MG tablet Take 1 tablet (1,000 mg total) by mouth 2 (two) times daily. 09/13/15  Yes Samuel JesterKathleen McManus, DO  omeprazole (PRILOSEC) 20 MG capsule Take 40 mg by mouth daily.  09/19/14  Yes Historical Provider, MD  sitaGLIPtin (JANUVIA) 100 MG tablet Take 1 tablet (100 mg total) by mouth daily. 09/13/15  Yes Samuel JesterKathleen McManus, DO  doxycycline (VIBRA-TABS) 100 MG tablet Take 1 tablet (100 mg total)  by mouth 2 (two) times daily. Patient not taking: Reported on 01/28/2016 09/13/15   Samuel Jester, DO    Family History Family History  Problem Relation Age of Onset  . Hypertension Mother   . CVA Mother   . CAD Father   . Hypercholesterolemia Father     Social History Social History  Substance Use Topics  . Smoking status: Current Every Day Smoker    Packs/day: 0.50    Years: 40.00    Types: Cigarettes  . Smokeless tobacco: Never Used  . Alcohol use No     Allergies   Cymbalta [duloxetine hcl]   Review of Systems Review of  Systems  Constitutional: Negative for fever.  Gastrointestinal: Negative for anorexia, nausea and vomiting.  Genitourinary: Positive for vaginal pain.  Skin:       abscess  All other systems reviewed and are negative.    Physical Exam Updated Vital Signs BP 161/63 (BP Location: Right Arm)   Pulse 64   Temp 97.6 F (36.4 C) (Oral)   Resp 16   Ht 5\' 3"  (1.6 m)   Wt 87.1 kg   SpO2 94%   BMI 34.01 kg/m   Physical Exam  Constitutional: She is oriented to person, place, and time. She appears well-developed and well-nourished.  Non-toxic appearance.  HENT:  Head: Normocephalic.  Right Ear: Tympanic membrane and external ear normal.  Left Ear: Tympanic membrane and external ear normal.  Eyes: EOM and lids are normal. Pupils are equal, round, and reactive to light.  Neck: Normal range of motion. Neck supple. Carotid bruit is not present.  Cardiovascular: Normal rate, regular rhythm, normal heart sounds, intact distal pulses and normal pulses.   Pulmonary/Chest: Breath sounds normal. No respiratory distress.  Abdominal: Soft. Bowel sounds are normal. There is no tenderness. There is no guarding.  Genitourinary:  Genitourinary Comments: Chaperone present during exam. Small abscess at the right lower labia of the vagina. Question mild drainage. The area is small and firm. No red streaks. Tender to palpation.  Musculoskeletal: Normal range of motion.  Lymphadenopathy:       Head (right side): No submandibular adenopathy present.       Head (left side): No submandibular adenopathy present.    She has no cervical adenopathy.  Neurological: She is alert and oriented to person, place, and time. She has normal strength. No cranial nerve deficit or sensory deficit.  Skin: Skin is warm and dry.  Psychiatric: She has a normal mood and affect. Her speech is normal.  Nursing note and vitals reviewed.    ED Treatments / Results  Labs (all labs ordered are listed, but only abnormal results  are displayed) Labs Reviewed - No data to display  EKG  EKG Interpretation None       Radiology No results found.  Procedures Procedures (including critical care time)  Medications Ordered in ED Medications - No data to display   Initial Impression / Assessment and Plan / ED Course  I have reviewed the triage vital signs and the nursing notes.  Pertinent labs & imaging results that were available during my care of the patient were reviewed by me and considered in my medical decision making (see chart for details).  Clinical Course    **I have reviewed nursing notes, vital signs, and all appropriate lab and imaging results for this patient.*  Final Clinical Impressions(s) / ED Diagnoses  The abscess area of the right lower labia of the vagina is not a candidate  for incision and drainage at this time. I advised patient to use warm tub soaks 2 times daily. She will use antibiotics. Given her medication for pain. The patient is to return to the emergency department if any changes, or signs of advancing infection. The patient is in agreement with this discharge plan.    Final diagnoses:  None    New Prescriptions New Prescriptions   No medications on file     Ivery Quale, PA-C 01/29/16 1313    Bethann Berkshire, MD 01/29/16 1537

## 2016-01-28 NOTE — ED Triage Notes (Signed)
Pt states she noticed an abscess on her right side of her groin yesterday. Denies any drainage. No fevers. VS stable.

## 2016-01-28 NOTE — ED Notes (Signed)
Unable to get pt to sign at discharge. Registration Staff Member in e-signature. Called office, no one picked up. Pt verbalized understanding of discharge instructions. No questions at time of departure from ED. nad noted.

## 2016-01-28 NOTE — Discharge Instructions (Signed)
Please soak in a tub of warm Epsom Salt  bath for 15 min 2 times daily. Use Clindamycin 2 times daily with food. Use tylenol for mild pain. Use percocet for more severe pain. See your primary MD or return to the ED if not improving.

## 2016-04-08 ENCOUNTER — Ambulatory Visit: Payer: Self-pay | Admitting: Physician Assistant

## 2016-04-08 ENCOUNTER — Encounter: Payer: Self-pay | Admitting: Physician Assistant

## 2016-04-08 VITALS — BP 150/90 | HR 80 | Temp 97.7°F | Ht 61.0 in | Wt 184.5 lb

## 2016-04-08 DIAGNOSIS — Z6834 Body mass index (BMI) 34.0-34.9, adult: Secondary | ICD-10-CM

## 2016-04-08 DIAGNOSIS — E669 Obesity, unspecified: Secondary | ICD-10-CM

## 2016-04-08 DIAGNOSIS — E118 Type 2 diabetes mellitus with unspecified complications: Principal | ICD-10-CM

## 2016-04-08 DIAGNOSIS — E1165 Type 2 diabetes mellitus with hyperglycemia: Secondary | ICD-10-CM

## 2016-04-08 DIAGNOSIS — IMO0002 Reserved for concepts with insufficient information to code with codable children: Secondary | ICD-10-CM | POA: Insufficient documentation

## 2016-04-08 DIAGNOSIS — I10 Essential (primary) hypertension: Secondary | ICD-10-CM

## 2016-04-08 DIAGNOSIS — E785 Hyperlipidemia, unspecified: Secondary | ICD-10-CM

## 2016-04-08 DIAGNOSIS — F17210 Nicotine dependence, cigarettes, uncomplicated: Secondary | ICD-10-CM

## 2016-04-08 NOTE — Progress Notes (Signed)
BP (!) 150/90 (BP Location: Left Arm, Patient Position: Sitting, Cuff Size: Normal)   Pulse 80   Temp 97.7 F (36.5 C)   Ht 5\' 1"  (1.549 m)   Wt 184 lb 8 oz (83.7 kg)   SpO2 97%   BMI 34.86 kg/m    Subjective:    Patient ID: Sarah Charles, female    DOB: 11/04/1960, 55 y.o.   MRN: [id number]  HPI: Sarah Charles is a 55 y.o. female presenting on 04/08/2016 for New Patient (Initial Visit) (returning patient. previous patient of [facility]. pt states she is taking meds, but is not sure what she is taking,)   HPI  Chief Complaint  Patient presents with  . New Patient (Initial Visit)    returning patient. previous patient of [facility]. pt states she is taking meds, but is not sure what she is taking,    Pt last seen here Sept 2016.  She says [facility].  Pt was dismissed from this office in the past for no-shows and had long history of noncompliance.  She says that she is going to do better this time and wants to get her health issues under control.   Pt is taking 2 meds but she doesn't know what she is taking and did not bring her meds with her today .  She is also taking insulin but she doesn't use it every day  She says her "monkey" is trying to form another abscess.  She was seen for that in ER in September and in May  [facility] the [facility]  Pt states dr Benard Rinkoark did colonoscopy in 2014 but nothing in EPIC  Relevant past medical, surgical, family and social history reviewed and updated as indicated. Interim medical history since our last visit reviewed. Allergies and medications reviewed and updated.  CURRENT MEDS: Unknown  Review of Systems  Constitutional: Negative for appetite change, chills, diaphoresis, fatigue, fever and unexpected weight change.  HENT: Positive for congestion and mouth sores. Negative for drooling, ear pain, facial swelling, hearing loss, sneezing, sore throat, trouble swallowing and voice change.   Eyes: Positive for  pain and visual disturbance. Negative for discharge, redness and itching.  Respiratory: Positive for cough and wheezing. Negative for choking and shortness of breath.   Cardiovascular: Negative for chest pain, palpitations and leg swelling.  Gastrointestinal: Negative for abdominal pain, blood in stool, constipation, diarrhea and vomiting.  Endocrine: Positive for polydipsia. Negative for cold intolerance and heat intolerance.  Genitourinary: Negative for decreased urine volume, dysuria and hematuria.  Musculoskeletal: Positive for arthralgias, back pain and gait problem.  Skin: Negative for rash.  Allergic/Immunologic: Negative for environmental allergies.  Neurological: Positive for headaches. Negative for seizures, syncope and light-headedness.  Hematological: Negative for adenopathy.  Psychiatric/Behavioral: Negative for agitation, dysphoric mood and suicidal ideas. The patient is not nervous/anxious.     Per HPI unless specifically indicated above     Objective:    BP (!) 150/90 (BP Location: Left Arm, Patient Position: Sitting, Cuff Size: Normal)   Pulse 80   Temp 97.7 F (36.5 C)   Ht 5\' 1"  (1.549 m)   Wt 184 lb 8 oz (83.7 kg)   SpO2 97%   BMI 34.86 kg/m   Wt Readings from Last 3 Encounters:  04/08/16 184 lb 8 oz (83.7 kg)  01/28/16 192 lb (87.1 kg)  10/10/15 197 lb (89.4 kg)    Physical Exam  Constitutional: She  is oriented to person, place, and time. She appears well-developed and well-nourished.  HENT:  Head: Normocephalic and atraumatic.  Mouth/Throat: Oropharynx is clear and moist. No oropharyngeal exudate.  Eyes: Conjunctivae and EOM are normal. Pupils are equal, round, and reactive to light.  Neck: Neck supple. No thyromegaly present.  Cardiovascular: Normal rate and regular rhythm.   Pulmonary/Chest: Effort normal and breath sounds normal.  Abdominal: Soft. Bowel sounds are normal. She exhibits no mass. There is no hepatosplenomegaly. There is no tenderness.   Musculoskeletal: She exhibits no edema.  Lymphadenopathy:    She has no cervical adenopathy.  Neurological: She is alert and oriented to person, place, and time. She has normal strength. No cranial nerve deficit or sensory deficit. She displays a negative Romberg sign. Coordination and gait normal.  Skin: Skin is warm and dry.  Psychiatric: She has a normal mood and affect. Her behavior is normal.  Vitals reviewed.   Results for orders placed or performed during the hospital encounter of 10/10/15  CBG monitoring, ED  Result Value Ref Range   Glucose-Capillary 236 (H) 65 - 99 mg/dL      Assessment & Plan:    Encounter Diagnoses  Name Primary?  Marland Kitchen. Uncontrolled type 2 diabetes mellitus with complication, unspecified long term insulin use status (HCC) Yes  . HYPERTENSION, BENIGN ESSENTIAL   . Hyperlipidemia, unspecified hyperlipidemia type   . Cigarette nicotine dependence without complication   . Class 1 obesity with body mass index (BMI) of 34.0 to 34.9 in adult, unspecified obesity type, unspecified whether serious comorbidity present     -[facility] -get baseline Labs -Pt reminded to bring meds to every appointment -encouraged pt to soak in tub to help her early abscess -follow up 2 weeks.  RTO sooner prn

## 2016-04-14 LAB — HEMOGLOBIN A1C
Hgb A1c MFr Bld: 12.4 % — ABNORMAL HIGH (ref <5.7)
Mean Plasma Glucose: 309 mg/dL

## 2016-04-15 LAB — LIPID PANEL
Cholesterol: 306 mg/dL — ABNORMAL HIGH (ref <200)
HDL: 33 mg/dL — ABNORMAL LOW (ref >50)
Total CHOL/HDL Ratio: 9.3 Ratio — ABNORMAL HIGH (ref <5.0)
Triglycerides: 1143 mg/dL — ABNORMAL HIGH (ref <150)

## 2016-04-15 LAB — COMPREHENSIVE METABOLIC PANEL
ALT: 9 U/L (ref 6–29)
AST: 11 U/L (ref 10–35)
Albumin: 3.8 g/dL (ref 3.6–5.1)
Alkaline Phosphatase: 130 U/L (ref 33–130)
BUN: 11 mg/dL (ref 7–25)
CO2: 25 mmol/L (ref 20–31)
Calcium: 9.3 mg/dL (ref 8.6–10.4)
Chloride: 100 mmol/L (ref 98–110)
Creat: 0.55 mg/dL (ref 0.50–1.05)
Glucose, Bld: 387 mg/dL — ABNORMAL HIGH (ref 65–99)
Potassium: 3.9 mmol/L (ref 3.5–5.3)
Sodium: 137 mmol/L (ref 135–146)
Total Bilirubin: 0.4 mg/dL (ref 0.2–1.2)
Total Protein: 6.6 g/dL (ref 6.1–8.1)

## 2016-04-15 LAB — MICROALBUMIN, URINE: Microalb, Ur: 0.6 mg/dL

## 2016-04-19 ENCOUNTER — Encounter: Payer: Self-pay | Admitting: Physician Assistant

## 2016-04-19 ENCOUNTER — Ambulatory Visit: Payer: Self-pay | Admitting: Physician Assistant

## 2016-04-19 VITALS — BP 160/90 | HR 80 | Temp 97.7°F | Ht 61.0 in | Wt 187.8 lb

## 2016-04-19 DIAGNOSIS — E785 Hyperlipidemia, unspecified: Secondary | ICD-10-CM

## 2016-04-19 DIAGNOSIS — Z6834 Body mass index (BMI) 34.0-34.9, adult: Secondary | ICD-10-CM

## 2016-04-19 DIAGNOSIS — E1165 Type 2 diabetes mellitus with hyperglycemia: Secondary | ICD-10-CM

## 2016-04-19 DIAGNOSIS — F17210 Nicotine dependence, cigarettes, uncomplicated: Secondary | ICD-10-CM

## 2016-04-19 DIAGNOSIS — I10 Essential (primary) hypertension: Secondary | ICD-10-CM

## 2016-04-19 DIAGNOSIS — E118 Type 2 diabetes mellitus with unspecified complications: Principal | ICD-10-CM

## 2016-04-19 DIAGNOSIS — E669 Obesity, unspecified: Secondary | ICD-10-CM

## 2016-04-19 MED ORDER — SITAGLIPTIN PHOS-METFORMIN HCL 50-1000 MG PO TABS: 1.0000 | ORAL_TABLET | Freq: Two times a day (BID) | ORAL | 1 refills | Status: DC

## 2016-04-19 MED ORDER — ATORVASTATIN CALCIUM 20 MG PO TABS
20.0000 mg | ORAL_TABLET | Freq: Every day | ORAL | 1 refills | Status: DC
Start: 2016-04-19 — End: 2016-08-02

## 2016-04-19 MED ORDER — LISINOPRIL 20 MG PO TABS
20.0000 mg | ORAL_TABLET | Freq: Every day | ORAL | 1 refills | Status: DC
Start: 2016-04-19 — End: 2016-07-23

## 2016-04-19 NOTE — Patient Instructions (Signed)
Fat and Cholesterol Restricted Diet High levels of fat and cholesterol in your blood may lead to various health problems, such as diseases of the heart, blood vessels, gallbladder, liver, and pancreas. Fats are concentrated sources of energy that come in various forms. Certain types of fat, including saturated fat, may be harmful in excess. Cholesterol is a substance needed by your body in small amounts. Your body makes all the cholesterol it needs. Excess cholesterol comes from the food you eat. When you have high levels of cholesterol and saturated fat in your blood, health problems can develop because the excess fat and cholesterol will gather along the walls of your blood vessels, causing them to narrow. Choosing the right foods will help you control your intake of fat and cholesterol. This will help keep the levels of these substances in your blood within normal limits and reduce your risk of disease. What is my plan? Your health care provider recommends that you:  Limit your fat intake to ______% or less of your total calories per day.  Limit the amount of cholesterol in your diet to less than _________mg per day.  Eat 20-30 grams of fiber each day.  What types of fat should I choose?  Choose healthy fats more often. Choose monounsaturated and polyunsaturated fats, such as olive and canola oil, flaxseeds, walnuts, almonds, and seeds.  Eat more omega-3 fats. Good choices include salmon, mackerel, sardines, tuna, flaxseed oil, and ground flaxseeds. Aim to eat fish at least two times a week.  Limit saturated fats. Saturated fats are primarily found in animal products, such as meats, butter, and cream. Plant sources of saturated fats include palm oil, palm kernel oil, and coconut oil.  Avoid foods with partially hydrogenated oils in them. These contain trans fats. Examples of foods that contain trans fats are stick margarine, some tub margarines, cookies, crackers, and other baked goods. What  general guidelines do I need to follow? These guidelines for healthy eating will help you control your intake of fat and cholesterol:  Check food labels carefully to identify foods with trans fats or high amounts of saturated fat.  Fill one half of your plate with vegetables and green salads.  Fill one fourth of your plate with whole grains. Look for the word "whole" as the first word in the ingredient list.  Fill one fourth of your plate with lean protein foods.  Limit fruit to two servings a day. Choose fruit instead of juice.  Eat more foods that contain fiber, such as apples, broccoli, carrots, beans, peas, and barley.  Eat more home-cooked food and less restaurant, buffet, and fast food.  Limit or avoid alcohol.  Limit foods high in starch and sugar.  Limit fried foods.  Cook foods using methods other than frying. Baking, boiling, grilling, and broiling are all great options.  Lose weight if you are overweight. Losing just 5-10% of your initial body weight can help your overall health and prevent diseases such as diabetes and heart disease.  What foods can I eat? Grains  Whole grains, such as whole wheat or whole grain breads, crackers, cereals, and pasta. Unsweetened oatmeal, bulgur, barley, quinoa, or brown rice. Corn or whole wheat flour tortillas. Vegetables  Fresh or frozen vegetables (raw, steamed, roasted, or grilled). Green salads. Fruits  All fresh, canned (in natural juice), or frozen fruits. Meats and other protein foods  Ground beef (85% or leaner), grass-fed beef, or beef trimmed of fat. Skinless chicken or turkey. Ground chicken or turkey.   Pork trimmed of fat. All fish and seafood. Eggs. Dried beans, peas, or lentils. Unsalted nuts or seeds. Unsalted canned or dry beans. Dairy  Low-fat dairy products, such as skim or 1% milk, 2% or reduced-fat cheeses, low-fat ricotta or cottage cheese, or plain low-fat yo Fats and oils  Tub margarines without trans  fats. Light or reduced-fat mayonnaise and salad dressings. Avocado. Olive, canola, sesame, or safflower oils. Natural peanut or almond butter (choose ones without added sugar and oil). The items listed above may not be a complete list of recommended foods or beverages. Contact your dietitian for more options. Foods to avoid Grains  White bread. White pasta. White rice. Cornbread. Bagels, pastries, and croissants. Crackers that contain trans fat. Vegetables  White potatoes. Corn. Creamed or fried vegetables. Vegetables in a cheese sauce. Fruits  Dried fruits. Canned fruit in light or heavy syrup. Fruit juice. Meats and other protein foods  Fatty cuts of meat. Ribs, chicken wings, bacon, sausage, bologna, salami, chitterlings, fatback, hot dogs, bratwurst, and packaged luncheon meats. Liver and organ meats. Dairy  Whole or 2% milk, cream, half-and-half, and cream cheese. Whole milk cheeses. Whole-fat or sweetened yogurt. Full-fat cheeses. Nondairy creamers and whipped toppings. Processed cheese, cheese spreads, or cheese curds. Beverages  Alcohol. Sweetened drinks (such as sodas, lemonade, and fruit drinks or punches). Fats and oils  Butter, stick margarine, lard, shortening, ghee, or bacon fat. Coconut, palm kernel, or palm oils. Sweets and desserts  Corn syrup, sugars, honey, and molasses. Candy. Jam and jelly. Syrup. Sweetened cereals. Cookies, pies, cakes, donuts, muffins, and ice cream. The items listed above may not be a complete list of foods and beverages to avoid. Contact your dietitian for more information. This information is not intended to replace advice given to you by your health care provider. Make sure you discuss any questions you have with your health care provider. Document Released: 05/10/2005 Document Revised: 05/31/2014 Document Reviewed: 08/08/2013 Elsevier Interactive Patient Education  2017 Elsevier Inc.  

## 2016-04-19 NOTE — Progress Notes (Signed)
BP (!) 160/90 (BP Location: Left Arm, Patient Position: Sitting, Cuff Size: Normal)   Pulse 80   Temp 97.7 F (36.5 C)   Ht 5\' 1"  (1.549 m)   Wt 187 lb 12.8 oz (85.2 kg)   SpO2 97%   BMI 35.48 kg/m    Subjective:    Patient ID: Sarah Charles, female    DOB: 11/21/1960, 55 y.o.   MRN: [id number]  HPI: Sarah Charles is a 55 y.o. female presenting on 04/19/2016 for Diabetes   HPI   Pt feeling fine today.  She says her "monkey" is better.  Relevant past medical, surgical, family and social history reviewed and updated as indicated. Interim medical history since our last visit reviewed. Allergies and medications reviewed and updated.   Current Outpatient Prescriptions:  .  insulin glargine (LANTUS) 100 UNIT/ML injection, Inject 10 Units into the skin daily as needed (for high blood sugars). , Disp: , Rfl:  .  lovastatin (MEVACOR) 20 MG tablet, Take 20 mg by mouth daily., Disp: , Rfl:  .  rosuvastatin (CRESTOR) 20 MG tablet, Take 20 mg by mouth daily., Disp: , Rfl:  .  sitaGLIPtin-metformin (JANUMET) 50-1000 MG tablet, Take 1 tablet by mouth 2 (two) times daily with a meal., Disp: , Rfl:    Review of Systems  Constitutional: Negative for appetite change, chills, diaphoresis, fatigue, fever and unexpected weight change.  HENT: Positive for congestion. Negative for drooling, ear pain, facial swelling, hearing loss, mouth sores, sneezing, sore throat, trouble swallowing and voice change.   Eyes: Positive for itching. Negative for pain, discharge, redness and visual disturbance.  Respiratory: Negative for cough, choking, shortness of breath and wheezing.   Cardiovascular: Negative for chest pain, palpitations and leg swelling.  Gastrointestinal: Negative for abdominal pain, blood in stool, constipation, diarrhea and vomiting.  Endocrine: Positive for polydipsia. Negative for cold intolerance and heat intolerance.  Genitourinary: Negative for decreased urine volume, dysuria and hematuria.   Musculoskeletal: Positive for arthralgias and back pain. Negative for gait problem.  Skin: Negative for rash.  Allergic/Immunologic: Positive for environmental allergies.  Neurological: Positive for headaches. Negative for seizures, syncope and light-headedness.  Hematological: Negative for adenopathy.  Psychiatric/Behavioral: Positive for dysphoric mood. Negative for agitation and suicidal ideas. The patient is nervous/anxious.     Per HPI unless specifically indicated above     Objective:    BP (!) 160/90 (BP Location: Left Arm, Patient Position: Sitting, Cuff Size: Normal)   Pulse 80   Temp 97.7 F (36.5 C)   Ht 5\' 1"  (1.549 m)   Wt 187 lb 12.8 oz (85.2 kg)   SpO2 97%   BMI 35.48 kg/m   Wt Readings from Last 3 Encounters:  04/19/16 187 lb 12.8 oz (85.2 kg)  04/08/16 184 lb 8 oz (83.7 kg)  01/28/16 192 lb (87.1 kg)    Physical Exam  Constitutional: She is oriented to person, place, and time. She appears well-developed and well-nourished.  HENT:  Head: Normocephalic and atraumatic.  Neck: Neck supple.  Cardiovascular: Normal rate and regular rhythm.   Pulmonary/Chest: Effort normal and breath sounds normal.  Abdominal: Soft. Bowel sounds are normal. She exhibits no mass. There is no hepatosplenomegaly. There is no tenderness.  Musculoskeletal: She exhibits no edema.  Lymphadenopathy:    She has no cervical adenopathy.  Neurological: She is alert and oriented to person, place, and time.  Skin: Skin is warm and dry.  Psychiatric: She has a normal mood and affect. Her behavior is  normal.  Vitals reviewed.   Results for orders placed or performed in visit on 04/08/16  HgB A1c  Result Value Ref Range   Hgb A1c MFr Bld 12.4 (H) <5.7 %   Mean Plasma Glucose 309 mg/dL  Lipid Profile  Result Value Ref Range   Cholesterol 306 (H) <200 mg/dL   Triglycerides 4,0981,143 (H) <150 mg/dL   HDL 33 (L) >11>50 mg/dL   Total CHOL/HDL Ratio 9.3 (H) <5.0 Ratio   VLDL NOT CALC <30 mg/dL    LDL Cholesterol NOT CALC <100 mg/dL  Comprehensive metabolic panel  Result Value Ref Range   Sodium 137 135 - 146 mmol/L   Potassium 3.9 3.5 - 5.3 mmol/L   Chloride 100 98 - 110 mmol/L   CO2 25 20 - 31 mmol/L   Glucose, Bld 387 (H) 65 - 99 mg/dL   BUN 11 7 - 25 mg/dL   Creat 9.140.55 7.820.50 - 9.561.05 mg/dL   Total Bilirubin 0.4 0.2 - 1.2 mg/dL   Alkaline Phosphatase 130 33 - 130 U/L   AST 11 10 - 35 U/L   ALT 9 6 - 29 U/L   Total Protein 6.6 6.1 - 8.1 g/dL   Albumin 3.8 3.6 - 5.1 g/dL   Calcium 9.3 8.6 - 21.310.4 mg/dL  Microalbumin, urine  Result Value Ref Range   Microalb, Ur 0.6 Not estab mg/dL      Assessment & Plan:   Encounter Diagnoses  Name Primary?  Marland Kitchen. Uncontrolled type 2 diabetes mellitus with complication, unspecified long term insulin use status (HCC) Yes  . HYPERTENSION, BENIGN ESSENTIAL   . Hyperlipidemia, unspecified hyperlipidemia type   . Cigarette nicotine dependence without complication   . Class 1 obesity with body mass index (BMI) of 34.0 to 34.9 in adult, unspecified obesity type, unspecified whether serious comorbidity present     -reviewed labs with pt -Pt counseled to Get meter and monitor fbs.  Discussed that her insulin needs to be increased and bs monitoring is very important to do this safely.  She states understanding.  -get pt signed up for [facility]- order  janumet, lipitor, lisinopril -pt to continue Lantus but increase to 20u qhs and monitor fbs.  Pt to notify office for fbs < 70 or > 300 -Recommend fish oil 3 daily but pt says 'that won't happen' because of limited funds. Gave lowfat diet handout and counseled pt -follow up with bs log 1 month. RTO sooner prn

## 2016-05-26 ENCOUNTER — Ambulatory Visit: Payer: Self-pay | Admitting: Physician Assistant

## 2016-05-27 ENCOUNTER — Encounter: Payer: Self-pay | Admitting: Physician Assistant

## 2016-05-31 ENCOUNTER — Ambulatory Visit: Payer: Self-pay | Attending: Family Medicine | Admitting: Family Medicine

## 2016-05-31 ENCOUNTER — Encounter: Payer: Self-pay | Admitting: Licensed Clinical Social Worker

## 2016-05-31 ENCOUNTER — Encounter: Payer: Self-pay | Admitting: Family Medicine

## 2016-05-31 VITALS — BP 161/97 | HR 80 | Temp 97.3°F | Resp 18 | Ht 63.0 in | Wt 188.0 lb

## 2016-05-31 DIAGNOSIS — Z79899 Other long term (current) drug therapy: Secondary | ICD-10-CM | POA: Insufficient documentation

## 2016-05-31 DIAGNOSIS — E118 Type 2 diabetes mellitus with unspecified complications: Secondary | ICD-10-CM

## 2016-05-31 DIAGNOSIS — R21 Rash and other nonspecific skin eruption: Secondary | ICD-10-CM

## 2016-05-31 DIAGNOSIS — E1165 Type 2 diabetes mellitus with hyperglycemia: Secondary | ICD-10-CM

## 2016-05-31 DIAGNOSIS — F32A Depression, unspecified: Secondary | ICD-10-CM

## 2016-05-31 DIAGNOSIS — Z0001 Encounter for general adult medical examination with abnormal findings: Secondary | ICD-10-CM | POA: Insufficient documentation

## 2016-05-31 DIAGNOSIS — Z794 Long term (current) use of insulin: Secondary | ICD-10-CM | POA: Insufficient documentation

## 2016-05-31 DIAGNOSIS — I10 Essential (primary) hypertension: Secondary | ICD-10-CM

## 2016-05-31 DIAGNOSIS — F329 Major depressive disorder, single episode, unspecified: Secondary | ICD-10-CM | POA: Insufficient documentation

## 2016-05-31 LAB — POCT URINALYSIS DIPSTICK
Bilirubin, UA: NEGATIVE
Glucose, UA: 500
Ketones, UA: NEGATIVE
Leukocytes, UA: NEGATIVE
Nitrite, UA: NEGATIVE
Spec Grav, UA: 1.015
Urobilinogen, UA: 0.2
pH, UA: 5.5

## 2016-05-31 LAB — GLUCOSE, POCT (MANUAL RESULT ENTRY): POC Glucose: 377 mg/dl — AB (ref 70–99)

## 2016-05-31 MED ORDER — INSULIN GLARGINE 100 UNIT/ML ~~LOC~~ SOLN: 10.0000 [IU] | Freq: Every day | SUBCUTANEOUS | 11 refills | Status: DC

## 2016-05-31 MED ORDER — LISINOPRIL 20 MG PO TABS: 20.0000 mg | ORAL_TABLET | Freq: Every day | ORAL | 2 refills | Status: DC

## 2016-05-31 MED ORDER — METFORMIN HCL 1000 MG PO TABS: 1000.0000 mg | ORAL_TABLET | Freq: Two times a day (BID) | ORAL | 2 refills | Status: DC

## 2016-05-31 MED ORDER — ATORVASTATIN CALCIUM 20 MG PO TABS: 20.0000 mg | ORAL_TABLET | Freq: Every day | ORAL | 2 refills | Status: DC

## 2016-05-31 MED ORDER — INSULIN ASPART 100 UNIT/ML ~~LOC~~ SOLN
20.0000 [IU] | Freq: Once | SUBCUTANEOUS | Status: AC
  Administered 2016-05-31: 20 [IU] via SUBCUTANEOUS

## 2016-05-31 MED ORDER — BUPROPION HCL ER (SR) 150 MG PO TB12: 150.0000 mg | ORAL_TABLET | Freq: Two times a day (BID) | ORAL | 2 refills | Status: DC

## 2016-05-31 MED ORDER — NYSTATIN 100000 UNIT/GM EX CREA: 1.0000 "application " | TOPICAL_CREAM | Freq: Two times a day (BID) | CUTANEOUS | 0 refills | Status: DC

## 2016-05-31 NOTE — BH Specialist Note (Signed)
Session Start time: 5:00 PM   End Time: 5:25 PM Total Time:  25 minutes Type of Service: Behavioral Health - Individual/Family Interpreter: No.   Interpreter Name & Language: N/Z # [facility]-June 2018: [facility]   SUBJECTIVE: Sarah AmmonsLaura Charles is a 56 y.o. female  Pt. was referred by FNP Ha[place]on for:  anxiety and depression. Pt. reports the following symptoms/concerns: withdrawn behavior, sleeping for prolonged periods of time, difficulty concentrating, racing thoughts, and panic attacks Duration of problem:  Ongoing approx 6 years Severity: moderate Previous treatment: None reported   OBJECTIVE: Mood: Anxious & Affect: Depressed and Tearful Risk of harm to self or others: Pt denied SI/HI Assessments administered: PHQ-9; GAD-7  LIFE CONTEXT:  Family & Social: Pt is married and is in the process of separation. She has two adult children 38(29 yo son and 56 yo daughter) Pt currently resides with her best friend [place]: Pt has been unemployed for approximately 9 years. She is currently trying to find employment. Pt receives Food Stamps [phone]($198) Self-Care: Pt reports smoking cigarettes (1.5 packs daily) and drinks alcohol "occasionally" Life changes: Pt was recently kicked out of her home by spouse on 05/16/16. She is experiencing financial strain. What is important to pt/family (values): Family, Independence   GOALS ADDRESSED:  Decrease symptoms of depression Decrease symptoms of anxiety  INTERVENTIONS: Solution Focused, Strength-based and Supportive   ASSESSMENT:  Pt currently experiencing depression and anxiety triggered by relationship conflict with spouse and financial strain. Pt reports withdrawn behavior, sleeping for prolonged periods of time, difficulty concentrating, racing thoughts, and panic attacks. She has limited support. Pt may benefit from psychotherapy and medication management. [facility] educated pt on the cycle of depression  and anxiety and the importance of implementing healthy coping strategies to decrease symptoms. Pt was able to identify multiple coping skills to assist in coping with depression, anxiety, and panic attacks. [facility] encouraged compliance with medication management and discussed benefits of scheduling an appointment with Financial Counseling. Pt was provided community resources to assist with crisis intervention, therapy, and medication management.     PLAN: 1. F/U with behavioral health clinician: Pt was encouraged to contact [facility] if symptoms worsen or fail to improve to [facility]. 2. Behavioral Health meds: Wellbutrin 3. Behavioral recommendations: [facility] recommends that pt apply healthy coping skills discussed. Pt is encouraged to schedule follow up appointment with [facility] 4. Referral: Brief Counseling/Psychotherapy, [facility], Problem-solving teaching/coping strategies, Psychoeducation and Supportive Counseling 5. From scale of 1-10, how likely are you to follow plan: 7/10   Bridgett LarssonJasmine D Lewis, MSW, [facility]  Clinical Social Worker 06/02/16 9:17 AM  Warmhandoff:   Warm Hand Off Completed.
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
## 2016-05-31 NOTE — Progress Notes (Signed)
Patient is here establish care, High blood sugar and rash on her pelvic area  BP 161/97 P 80 Blood sugar 377   She need refill on all her meds

## 2016-05-31 NOTE — Patient Instructions (Signed)
Type 2 Diabetes Mellitus, Self Care, Adult When you have type 2 diabetes (type 2 diabetes mellitus), you must keep your blood sugar (glucose) under control. You can do this with:  Nutrition.  Exercise.  Lifestyle changes.  Medicines or insulin, if needed.  Support from your doctors and others. How do I manage my blood sugar?  Check your blood sugar level every day, as often as told.  Call your doctor if your blood sugar is above your goal numbers for 2 tests in a row.  Have your A1c (hemoglobin A1c) level checked at least two times a year. Have it checked more often if your doctor tells you to. Your doctor will set treatment goals for you. Generally, you should have these blood sugar levels:  Before meals (preprandial): 80-130 mg/dL (4.4-7.2 mmol/L).  After meals (postprandial): lower than 180 mg/dL (10 mmol/L).  A1c level: less than 7%. What do I need to know about high blood sugar? High blood sugar is called hyperglycemia. Know the signs of high blood sugar. Signs may include:  Feeling:  Thirsty.  Hungry.  Very tired.  Needing to pee (urinate) more than usual.  Blurry vision. What do I need to know about low blood sugar? Low blood sugar is called hypoglycemia. This is when blood sugar is at or below 70 mg/dL (3.9 mmol/L). Symptoms may include:  Feeling:  Hungry.  Worried or nervous (anxious).  Sweaty and clammy.  Confused.  Dizzy.  Sleepy.  Sick to your stomach (nauseous).  Having:  A fast heartbeat (palpitations).  A headache.  A change in your vision.  Jerky movements that you cannot control (seizure).  Nightmares.  Tingling or no feeling (numbness) around the mouth, lips, or tongue.  Having trouble with:  Talking.  Paying attention (concentrating).  Moving (coordination).  Sleeping.  Shaking.  Passing out (fainting).  Getting upset easily (irritability). Treating low blood sugar  To treat low blood sugar, eat or drink  something sugary right away. If you can think clearly and swallow safely, follow the 15:15 rule:  Take 15 grams of a fast-acting carb (carbohydrate). Some fast-acting carbs are:  1 tube of glucose gel.  3 sugar tablets (glucose pills).  6-8 pieces of hard candy.  4 oz (120 mL) of fruit juice.  4 oz (120 mL) regular (not diet) soda.  Check your blood sugar 15 minutes after you take the carb.  If your blood sugar is still at or below 70 mg/dL (3.9 mmol/L), take 15 grams of a carb again.  If your blood sugar does not go above 70 mg/dL (3.9 mmol/L) after 3 tries, get help right away.  After your blood sugar goes back to normal, eat a meal or a snack within 1 hour. Treating very low blood sugar  If your blood sugar is at or below 54 mg/dL (3 mmol/L), you have very low blood sugar (severe hypoglycemia). This is an emergency. Do not wait to see if the symptoms will go away. Get medical help right away. Call your local emergency services (911 in the [place] Do not drive yourself to the hospital. If you have very low blood sugar and you cannot eat or drink, you may need a glucagon shot (injection). A family member or friend should learn how to check your blood sugar and how to give you a glucagon shot. Ask your doctor if you need to have a glucagon shot kit at home. What else is important to manage my diabetes? Medicine  Follow these instructions   about insulin and diabetes medicines:  Take them as told by your doctor.  Adjust them as told by your doctor.  Do not run out of them. Having diabetes can raise your risk for other long-term conditions. These include heart or kidney disease. Your doctor may prescribe medicines to help prevent problems from diabetes. Food   Make healthy food choices. These include:  Chicken, fish, egg whites, and beans.  Oats, whole wheat, bulgur, brown rice, quinoa, and millet.  Fresh fruits and vegetables.  Low-fat dairy products.  Nuts, avocado, olive  oil, and canola oil.  Make a food plan with a specialist (dietitian).  Follow instructions from your doctor about what you cannot eat or drink.  Drink enough fluid to keep your pee (urine) clear or pale yellow.  Eat healthy snacks between healthy meals.  Keep track of carbs that you eat. Read food labels. Learn food serving sizes.  Follow your sick day plan when you cannot eat or drink normally. Make this plan with your doctor so it is ready to use. Activity  Exercise at least 3 times a week.  Do not go more than 2 days without exercising.  Talk with your doctor before you start a new exercise. Your doctor may need to adjust your insulin, medicines, or food. Lifestyle   Do not use any tobacco products. These include cigarettes, chewing tobacco, and e-cigarettes.If you need help quitting, ask your doctor.  Ask your doctor how much alcohol is safe for you.  Learn to deal with stress. If you need help with this, ask your doctor. Body care  Stay up to date with your shots (immunizations).  Have your eyes and feet checked by a doctor as often as told.  Check your skin and feet every day. Check for cuts, bruises, redness, blisters, or sores.  Brush your teeth and gums two times a day.  Floss at least one time a day.  Go to the dentist least one time every 6 months.  Stay at a healthy weight. General instructions   Take over-the-counter and prescription medicines only as told by your doctor.  Share your diabetes care plan with:  Your work or school.  People you live with.  Check your pee (urine) for ketones:  When you are sick.  As told by your doctor.  Carry a card or wear jewelry that says that you have diabetes.  Ask your doctor:  Do I need to meet with a diabetes educator?  Where can I find a support group for people with diabetes?  Keep all follow-up visits as told by your doctor. This is important. Where to find more information: To learn more about  diabetes, visit:  American Diabetes Association: www.diabetes.org  American Association of Diabetes Educators: www.diabeteseducator.org/patient-resources This information is not intended to replace advice given to you by your health care provider. Make sure you discuss any questions you have with your health care provider. Document Released: 09/01/2015 Document Revised: 10/16/2015 Document Reviewed: 06/13/2015 Elsevier Interactive Patient Education  2017 [employer]vier Inc.  

## 2016-06-01 MED FILL — NYSTATIN 100,000 UNIT/GM CR: 100000 | 15 days supply | Qty: 30 | Fill #0

## 2016-06-01 MED FILL — !LANTUS 100 UNITS/ML VIAL: 100 | 30 days supply | Qty: 10 | Fill #0

## 2016-06-01 MED FILL — ?ATORVASTATIN 20 MG TABLET: 20 | 30 days supply | Qty: 30 | Fill #0

## 2016-06-01 MED FILL — ?LISINOPRIL 20 MG TABLET: 20 | 30 days supply | Qty: 30 | Fill #0

## 2016-06-01 MED FILL — ?METFORMIN HCL 1,000 MG TAB: 1000 | 30 days supply | Qty: 60 | Fill #0

## 2016-06-02 LAB — URINE CYTOLOGY ANCILLARY ONLY
Chlamydia: NEGATIVE
Neisseria Gonorrhea: NEGATIVE
Trichomonas: NEGATIVE

## 2016-06-04 LAB — URINE CYTOLOGY ANCILLARY ONLY
Bacterial vaginitis: NEGATIVE
Candida vaginitis: NEGATIVE

## 2016-06-07 ENCOUNTER — Telehealth: Payer: Self-pay

## 2016-06-07 ENCOUNTER — Other Ambulatory Visit: Payer: Self-pay | Admitting: Family Medicine

## 2016-06-07 DIAGNOSIS — R3129 Other microscopic hematuria: Secondary | ICD-10-CM

## 2016-06-07 DIAGNOSIS — R809 Proteinuria, unspecified: Secondary | ICD-10-CM

## 2016-06-07 NOTE — Telephone Encounter (Signed)
CMA call to over with lab results & to recommend to schedule an appt with us No answer No VM set up

## 2016-06-07 NOTE — Telephone Encounter (Signed)
-----   Message from Lizbeth BarkMandesia R Hairston, FNP sent at 06/07/2016  2:57 AM EST ----- -Urine test showed trace amount of blood and protein. Recommend coming back this week for additional lab tests to further assess kidney function. -Continue to take Lisinopril.

## 2016-06-08 NOTE — Telephone Encounter (Signed)
CMA call to go over the lab results still no answer

## 2016-07-13 ENCOUNTER — Encounter (HOSPITAL_COMMUNITY): Payer: Self-pay | Admitting: Emergency Medicine

## 2016-07-13 ENCOUNTER — Emergency Department (HOSPITAL_COMMUNITY)
Admission: EM | Admit: 2016-07-13 | Discharge: 2016-07-14 | Disposition: A | Discharge status: Home / Self Care | Payer: Self-pay | Attending: Emergency Medicine | Admitting: Emergency Medicine

## 2016-07-13 DIAGNOSIS — E119 Type 2 diabetes mellitus without complications: Secondary | ICD-10-CM | POA: Insufficient documentation

## 2016-07-13 DIAGNOSIS — F17210 Nicotine dependence, cigarettes, uncomplicated: Secondary | ICD-10-CM | POA: Insufficient documentation

## 2016-07-13 DIAGNOSIS — R05 Cough: Secondary | ICD-10-CM | POA: Insufficient documentation

## 2016-07-13 DIAGNOSIS — I10 Essential (primary) hypertension: Secondary | ICD-10-CM | POA: Insufficient documentation

## 2016-07-13 DIAGNOSIS — R6889 Other general symptoms and signs: Secondary | ICD-10-CM

## 2016-07-13 DIAGNOSIS — R509 Fever, unspecified: Secondary | ICD-10-CM | POA: Insufficient documentation

## 2016-07-13 DIAGNOSIS — Z794 Long term (current) use of insulin: Secondary | ICD-10-CM | POA: Insufficient documentation

## 2016-07-13 NOTE — ED Triage Notes (Signed)
Pt states she feels like she has the flu  Pt states she has headache, body aches, fever, and a little cough  Pt states her mother has recently been in the hospital with the flu and she has been around her

## 2016-07-14 ENCOUNTER — Emergency Department (HOSPITAL_COMMUNITY): Payer: Self-pay

## 2016-07-14 MED ORDER — IBUPROFEN 200 MG PO TABS
600.0000 mg | ORAL_TABLET | Freq: Once | ORAL | Status: AC
  Administered 2016-07-14: 600 mg via ORAL
  Filled 2016-07-14: qty 3

## 2016-07-14 MED ORDER — IBUPROFEN 600 MG PO TABS: 600.0000 mg | ORAL_TABLET | Freq: Four times a day (QID) | ORAL | 0 refills | Status: DC | PRN

## 2016-07-14 MED ORDER — OSELTAMIVIR PHOSPHATE 75 MG PO CAPS: 75.0000 mg | ORAL_CAPSULE | Freq: Two times a day (BID) | ORAL | 0 refills | Status: DC

## 2016-07-14 NOTE — Discharge Instructions (Signed)
Please read and follow all provided instructions.  Your diagnoses today include:  1. Flu-like symptoms     Tests performed today include: Chest Xray Vital signs. See below for your results today.   Medications prescribed:   Take any prescribed medications only as directed.  Home care instructions:  Follow any educational materials contained in this packet. Please continue drinking plenty of fluids. Use over-the-counter cold and flu medications as needed as directed on packaging for symptom relief. You may also use ibuprofen or tylenol as directed on packaging for pain or fever.   BE VERY CAREFUL not to take multiple medicines containing Tylenol (also called acetaminophen). Doing so can lead to an overdose which can damage your liver and cause liver failure and possibly death.   Follow-up instructions: Please follow-up with your primary care provider in the next 3 days for further evaluation of your symptoms.   Return instructions:  Please return to the Emergency Department if you experience worsening symptoms. Please return if you have a high fever greater than 101 degrees not controlled with over-the-counter medications, persistent vomiting and cannot keep down fluids, or worsening trouble breathing. Please return if you have any other emergent concerns.  Additional Information:  Your vital signs today were: BP 132/88 (BP Location: Left Arm)    Pulse 105    Temp 99 F (37.2 C) (Oral)    Resp 12    SpO2 94%  If your blood pressure (BP) was elevated above 135/85 this visit, please have this repeated by your doctor within one month.

## 2016-07-14 NOTE — ED Provider Notes (Signed)
[facility]-EMERGENCY DEPT Provider Note   CSN: [id number] Arrival date & time: 07/13/16  2315  By signing my name below, I, Modena Jansky, attest that this documentation has been prepared under the direction and in the presence of non-physician practitioner, Audry Pili, PA-C. Electronically Signed: Modena Jansky, Scribe. 07/14/2016. 1:37 AM.  History   Chief Complaint Chief Complaint  Patient presents with  . flu like symptoms   The history is provided by the patient. No language interpreter was used.   HPI Comments: Sarah Charles is a 56 y.o. female who presents to the Emergency Department complaining of intermittent moderate cough that started this morning. She states she has been having gradually worsening URI. She found out her mother, whom she lives with, was recently diagnosed with influenza. No treatment PTA. She reports associated symptoms of generalized myalgias, fever (Tmax:101), and intermittent mild SOB. Pt's temperature in the ED today was 99. She denies any nausea, vomiting, diarrhea, abdominal pain, chest pain, or other complaints.   PCP: Arrie Senate, FNP  Past Medical History:  Diagnosis Date  . Arthritis   . Depression   . Diabetes mellitus   . GERD (gastroesophageal reflux disease)   . Hypercholesterolemia   . Hyperlipidemia   . Hypertension   . Sleep apnea    pt sts does not use "cause i dont think I need it anymore".    Patient Active Problem List   Diagnosis Date Noted  . Depression 05/31/2016  . Hypertension 05/31/2016  . Uncontrolled type 2 diabetes mellitus with complication (HCC) 04/08/2016  . Type 2 diabetes mellitus with complication (HCC) 04/20/2015  . Cellulitis of breast 12/29/2013  . Diabetes mellitus (HCC) 12/29/2013  . Cellulitis of female breast 12/29/2013  . Symptomatic menopausal or female climacteric states 04/08/2008  . OTHER SPECIFIED DISEASE OF NAIL 04/08/2008  . URINARY URGENCY 04/08/2008  . COUGH 04/02/2008  . HYPERTENSION, BENIGN  ESSENTIAL 04/12/2007  . FATIGUE 04/12/2007  . OBESITY, NOS 07/21/2006  . DEPRESSION, MAJOR, RECURRENT 07/21/2006  . TOBACCO DEPENDENCE 07/21/2006  . GASTROESOPHAGEAL REFLUX, NO ESOPHAGITIS 07/21/2006  . INCONTINENCE, STRESS, FEMALE 07/21/2006    Past Surgical History:  Procedure Laterality Date  . CHOLECYSTECTOMY    . INCISION AND DRAINAGE ABSCESS Left 12/22/2012   Procedure: INCISION AND DRAINAGE ABSCESS;  Surgeon: Dalia Heading, MD;  Location: [facility];  Service: General;  Laterality: Left;  . TUBAL LIGATION      OB History    No data available       Home Medications    Prior to Admission medications   Medication Sig Start Date End Date Taking? Authorizing Provider  atorvastatin (LIPITOR) 20 MG tablet Take 1 tablet (20 mg total) by mouth daily. For cholesterol 04/19/16   Jacquelin Hawking, PA-C  atorvastatin (LIPITOR) 20 MG tablet Take 1 tablet (20 mg total) by mouth daily. 05/31/16   Lizbeth Bark, FNP  buPROPion (WELLBUTRIN SR) 150 MG 12 hr tablet Take 1 tablet (150 mg total) by mouth 2 (two) times daily. 05/31/16   Lizbeth Bark, FNP  insulin glargine (LANTUS) 100 UNIT/ML injection Inject 10 Units into the skin daily as needed (for high blood sugars).     Historical Provider, MD  insulin glargine (LANTUS) 100 UNIT/ML injection Inject 0.1 mLs (10 Units total) into the skin at bedtime. 05/31/16   Lizbeth Bark, FNP  lisinopril (PRINIVIL,ZESTRIL) 20 MG tablet Take 1 tablet (20 mg total) by mouth daily. For blood pressure 04/19/16   Jacquelin Hawking, PA-C  lisinopril (  PRINIVIL,ZESTRIL) 20 MG tablet Take 1 tablet (20 mg total) by mouth daily. 05/31/16   Lizbeth BarkMandesia R Hairston, FNP  lovastatin (MEVACOR) 20 MG tablet Take 20 mg by mouth daily.    Historical Provider, MD  metFORMIN (GLUCOPHAGE) 1000 MG tablet Take 1 tablet (1,000 mg total) by mouth 2 (two) times daily with a meal. 05/31/16   Lizbeth BarkMandesia R Hairston, FNP  nystatin cream (MYCOSTATIN) Apply 1 application topically 2 (two)  times daily. Apply to affected areas. For 2 weeks. 05/31/16   Lizbeth BarkMandesia R Hairston, FNP  rosuvastatin (CRESTOR) 20 MG tablet Take 20 mg by mouth daily.    Historical Provider, MD    Family History Family History  Problem Relation Age of Onset  . Hypertension Mother   . CVA Mother   . COPD Mother   . Heart disease Mother   . Kidney Stones Mother   . CAD Father   . Hypercholesterolemia Father   . Heart attack Father   . Cancer Maternal Uncle   . Diabetes Maternal Grandmother   . Cancer Maternal Grandfather     lung cancer    Social History Social History  Substance Use Topics  . Smoking status: Current Every Day Smoker    Packs/day: 0.50    Years: 41.00    Types: Cigarettes  . Smokeless tobacco: Never Used  . Alcohol use No     Allergies   Cymbalta [duloxetine hcl]   Review of Systems Review of Systems  Constitutional: Positive for fever (Tmax: 101).  Respiratory: Positive for cough and shortness of breath.   Cardiovascular: Negative for chest pain.  Gastrointestinal: Negative for abdominal pain, diarrhea, nausea and vomiting.  Musculoskeletal: Positive for myalgias (Generalized).  All other systems reviewed and are negative.  Physical Exam Updated Vital Signs BP 132/88 (BP Location: Left Arm)   Pulse 105   Temp 99 F (37.2 C) (Oral)   Resp 12   SpO2 94%   Physical Exam  Constitutional: She is oriented to person, place, and time. She appears well-developed and well-nourished. No distress.  HENT:  Head: Normocephalic and atraumatic.  Right Ear: Tympanic membrane, external ear and ear canal normal.  Left Ear: Tympanic membrane, external ear and ear canal normal.  Nose: Nose normal.  Mouth/Throat: Uvula is midline, oropharynx is clear and moist and mucous membranes are normal. No trismus in the jaw. No oropharyngeal exudate, posterior oropharyngeal erythema or tonsillar abscesses.  Eyes: Conjunctivae and EOM are normal. Pupils are equal, round, and reactive to  light.  Neck: Normal range of motion. Neck supple. No tracheal deviation present.  Cardiovascular: Normal rate, regular rhythm, S1 normal, S2 normal, normal heart sounds, intact distal pulses and normal pulses.   Pulmonary/Chest: Effort normal and breath sounds normal. No respiratory distress. She has no decreased breath sounds. She has no wheezes. She has no rhonchi. She has no rales.  Abdominal: Soft. Normal appearance and bowel sounds are normal. There is no tenderness.  Musculoskeletal: Normal range of motion.  Neurological: She is alert and oriented to person, place, and time.  Skin: Skin is warm and dry.  Psychiatric: She has a normal mood and affect. Her speech is normal and behavior is normal. Thought content normal.  Nursing note and vitals reviewed.  ED Treatments / Results  DIAGNOSTIC STUDIES: Oxygen Saturation is 94% on RA, normal by my interpretation.    COORDINATION OF CARE: 1:41 AM- Pt advised of plan for treatment and pt agrees.  Labs (all labs ordered are listed, but  only abnormal results are displayed) Labs Reviewed - No data to display  EKG  EKG Interpretation None      Radiology Dg Chest 2 View  Result Date: 07/14/2016 CLINICAL DATA:  Cough and fever EXAM: CHEST  2 VIEW COMPARISON:  None. FINDINGS: The heart size and mediastinal contours are within normal limits. Both lungs are clear. The visualized skeletal structures are unremarkable. IMPRESSION: No active cardiopulmonary disease. Electronically Signed   By: Deatra Robinson M.D.   On: 07/14/2016 01:44    Procedures Procedures (including critical care time)  Medications Ordered in ED Medications - No data to display   Initial Impression / Assessment and Plan / ED Course  I have reviewed the triage vital signs and the nursing notes.  Pertinent labs & imaging results that were available during my care of the patient were reviewed by me and considered in my medical decision making (see chart for  details).    Final Clinical Impressions(s) / ED Diagnoses   {I have reviewed and evaluated the relevant imaging studies.  {I have reviewed the relevant previous healthcare records.  {I obtained HPI from historian.   ED Course:  Assessment: Patient with symptoms consistent with influenza.  Vitals are stable, low-grade fever.  No signs of dehydration, tolerating PO's.  Lungs are clear. CXR unremarkable  Discussed the cost versus benefit of Tamiflu treatment with the patient.  Given Rx Tamiflu.  Patient will be discharged with instructions to orally hydrate, rest, and use over-the-counter medications such as anti-inflammatories ibuprofen and Aleve for muscle aches and Tylenol for fever.  Patient will also be given a cough suppressant.   Disposition/Plan:  DC Home Additional Verbal discharge instructions given and discussed with patient.  Pt Instructed to f/u with PCP in the next week for evaluation and treatment of symptoms. Return precautions given Pt acknowledges and agrees with plan  Supervising Physician April Palumbo, MD  Final diagnoses:  Flu-like symptoms    New Prescriptions New Prescriptions   No medications on file   I personally performed the services described in this documentation, which was scribed in my presence. The recorded information has been reviewed and is accurate.     Audry Pili, PA-C 07/14/16 0239    Cy Blamer, MD 07/14/16 [id number]

## 2016-07-16 ENCOUNTER — Ambulatory Visit: Payer: Self-pay

## 2016-07-23 ENCOUNTER — Telehealth: Payer: Self-pay | Admitting: Family Medicine

## 2016-07-23 ENCOUNTER — Ambulatory Visit: Payer: Self-pay | Attending: Family Medicine | Admitting: Family Medicine

## 2016-07-23 VITALS — BP 155/85 | HR 85 | Temp 97.5°F | Resp 18 | Ht 63.0 in | Wt 178.4 lb

## 2016-07-23 DIAGNOSIS — E1165 Type 2 diabetes mellitus with hyperglycemia: Secondary | ICD-10-CM

## 2016-07-23 DIAGNOSIS — R21 Rash and other nonspecific skin eruption: Secondary | ICD-10-CM | POA: Insufficient documentation

## 2016-07-23 DIAGNOSIS — F32A Depression, unspecified: Secondary | ICD-10-CM

## 2016-07-23 DIAGNOSIS — I10 Essential (primary) hypertension: Secondary | ICD-10-CM | POA: Insufficient documentation

## 2016-07-23 DIAGNOSIS — Z9114 Patient's other noncompliance with medication regimen: Secondary | ICD-10-CM | POA: Insufficient documentation

## 2016-07-23 DIAGNOSIS — E118 Type 2 diabetes mellitus with unspecified complications: Secondary | ICD-10-CM

## 2016-07-23 DIAGNOSIS — F329 Major depressive disorder, single episode, unspecified: Secondary | ICD-10-CM | POA: Insufficient documentation

## 2016-07-23 DIAGNOSIS — Z794 Long term (current) use of insulin: Secondary | ICD-10-CM | POA: Insufficient documentation

## 2016-07-23 DIAGNOSIS — E119 Type 2 diabetes mellitus without complications: Secondary | ICD-10-CM | POA: Insufficient documentation

## 2016-07-23 LAB — POCT URINALYSIS DIPSTICK
Glucose, UA: 500
Glucose, UA: 500
Ketones, UA: >=160
Ketones, UA: >=160
Leukocytes, UA: NEGATIVE
Leukocytes, UA: NEGATIVE
Nitrite, UA: NEGATIVE
Nitrite, UA: NEGATIVE
Protein, UA: 100
Protein, UA: 30
Spec Grav, UA: 1.02
Spec Grav, UA: 1.02
Urobilinogen, UA: 0.2
Urobilinogen, UA: 0.2
pH, UA: 5
pH, UA: 5

## 2016-07-23 LAB — BASIC METABOLIC PANEL WITH GFR
BUN: 11 mg/dL (ref 7–25)
CO2: 25 mmol/L (ref 20–31)
Calcium: 9.1 mg/dL (ref 8.6–10.4)
Chloride: 98 mmol/L (ref 98–110)
Creat: 0.5 mg/dL (ref 0.50–1.05)
GFR, Est African American: >89 mL/min (ref >=60)
GFR, Est Non African American: >89 mL/min (ref >=60)
Glucose, Bld: 295 mg/dL — ABNORMAL HIGH (ref 65–99)
Potassium: 4.1 mmol/L (ref 3.5–5.3)
Sodium: 135 mmol/L (ref 135–146)

## 2016-07-23 LAB — GLUCOSE, POCT (MANUAL RESULT ENTRY)
POC Glucose: 306 mg/dl — AB (ref 70–99)
POC Glucose: 307 mg/dl — AB (ref 70–99)
POC Glucose: 283 mg/dl — AB (ref 70–99)

## 2016-07-23 LAB — LIPID PANEL
Cholesterol: 232 mg/dL — ABNORMAL HIGH (ref <200)
HDL: 24 mg/dL — ABNORMAL LOW (ref >50)
Total CHOL/HDL Ratio: 9.7 Ratio — ABNORMAL HIGH (ref <5.0)
Triglycerides: 654 mg/dL — ABNORMAL HIGH (ref <150)

## 2016-07-23 LAB — POCT GLYCOSYLATED HEMOGLOBIN (HGB A1C): Hemoglobin A1C: 13.2

## 2016-07-23 MED ORDER — TRUEPLUS LANCETS 28G MISC: 1.0000 | Freq: Once | 12 refills | Status: AC

## 2016-07-23 MED ORDER — INSULIN GLARGINE 100 UNIT/ML ~~LOC~~ SOLN: 10.0000 [IU] | Freq: Every day | SUBCUTANEOUS | 11 refills | Status: DC

## 2016-07-23 MED ORDER — INSULIN SYRINGES (DISPOSABLE) U-100 1 ML MISC: 1.0000 | Freq: Once | 11 refills | Status: AC

## 2016-07-23 MED ORDER — BUPROPION HCL ER (SR) 150 MG PO TB12: 150.0000 mg | ORAL_TABLET | Freq: Two times a day (BID) | ORAL | 2 refills | Status: DC

## 2016-07-23 MED ORDER — TRUE METRIX METER W/DEVICE KIT: 1.0000 | PACK | Freq: Once | 0 refills | Status: AC

## 2016-07-23 MED ORDER — GLUCOSE BLOOD VI STRP: ORAL_STRIP | 12 refills | Status: DC

## 2016-07-23 MED ORDER — LISINOPRIL 20 MG PO TABS: 20.0000 mg | ORAL_TABLET | Freq: Every day | ORAL | 2 refills | Status: DC

## 2016-07-23 MED ORDER — METFORMIN HCL 1000 MG PO TABS
1000.0000 mg | ORAL_TABLET | Freq: Two times a day (BID) | ORAL | 2 refills | Status: DC
Start: 2016-07-23 — End: 2016-08-02

## 2016-07-23 MED ORDER — METFORMIN HCL 1000 MG PO TABS: 1000.0000 mg | ORAL_TABLET | Freq: Two times a day (BID) | ORAL | 2 refills | Status: DC

## 2016-07-23 MED ORDER — INSULIN ASPART 100 UNIT/ML ~~LOC~~ SOLN
10.0000 [IU] | Freq: Once | SUBCUTANEOUS | Status: AC
  Administered 2016-07-23: 10 [IU] via SUBCUTANEOUS

## 2016-07-23 MED ORDER — INSULIN ASPART 100 UNIT/ML ~~LOC~~ SOLN
5.0000 [IU] | Freq: Once | SUBCUTANEOUS | Status: AC
  Administered 2016-07-23: 5 [IU] via SUBCUTANEOUS

## 2016-07-23 NOTE — Progress Notes (Signed)
Subjective:  Patient ID: Sarah Charles, female    DOB: June 13, 1960  Age: 56 y.o. MRN: [id number]  CC: No chief complaint on file.   HPI Daielle Melcher presents for   DM: History of nonadherance with medications. Reports being without diabetic medications for several months. She does not take her blood glucose at home despite glucometer and diabetic supplies being ordered in the past. Denies any wounds or paresthesias.  HTN: History of nonadherence with medication. Denies any CP, SOB, or BLE. Reports being inconsistent with medication use.  Vaginal complaint: Reports redness, itching, and irritation. Denies any dysuria, tenderness in pelvic region, or vaginal discharge.    Outpatient Medications Prior to Visit  Medication Sig Dispense Refill  . atorvastatin (LIPITOR) 20 MG tablet Take 1 tablet (20 mg total) by mouth daily. For cholesterol 90 tablet 1  . atorvastatin (LIPITOR) 20 MG tablet Take 1 tablet (20 mg total) by mouth daily. 30 tablet 2  . ibuprofen (ADVIL,MOTRIN) 600 MG tablet Take 1 tablet (600 mg total) by mouth every 6 (six) hours as needed. 30 tablet 0  . insulin glargine (LANTUS) 100 UNIT/ML injection Inject 10 Units into the skin daily as needed (for high blood sugars).     . lovastatin (MEVACOR) 20 MG tablet Take 20 mg by mouth daily.    Marland Kitchen nystatin cream (MYCOSTATIN) Apply 1 application topically 2 (two) times daily. Apply to affected areas. For 2 weeks. 30 g 0  . oseltamivir (TAMIFLU) 75 MG capsule Take 1 capsule (75 mg total) by mouth every 12 (twelve) hours. 10 capsule 0  . rosuvastatin (CRESTOR) 20 MG tablet Take 20 mg by mouth daily.    Marland Kitchen buPROPion (WELLBUTRIN SR) 150 MG 12 hr tablet Take 1 tablet (150 mg total) by mouth 2 (two) times daily. 60 tablet 2  . insulin glargine (LANTUS) 100 UNIT/ML injection Inject 0.1 mLs (10 Units total) into the skin at bedtime. 10 mL 11  . lisinopril (PRINIVIL,ZESTRIL) 20 MG tablet Take 1 tablet (20 mg total) by mouth daily. For blood  pressure 90 tablet 1  . lisinopril (PRINIVIL,ZESTRIL) 20 MG tablet Take 1 tablet (20 mg total) by mouth daily. 30 tablet 2  . metFORMIN (GLUCOPHAGE) 1000 MG tablet Take 1 tablet (1,000 mg total) by mouth 2 (two) times daily with a meal. 60 tablet 2   No facility-administered medications prior to visit.     ROS Review of Systems  Eyes: Negative.   Respiratory: Negative.   Cardiovascular: Negative.   Gastrointestinal: Negative.   Endocrine: Positive for polyuria.  Musculoskeletal: Negative.   Skin: Positive for rash.    Objective:  BP (!) 155/85 (BP Location: Right Arm, Patient Position: Sitting, Cuff Size: Normal)   Pulse 85   Temp 97.5 F (36.4 C) (Oral)   Resp 18   Ht _0  (1.6 m)   Wt 178 lb 6.4 oz (80.9 kg)   SpO2 96%   BMI 31.60 kg/m   BP/Weight 07/23/2016 2/99/2426 12/25/4194  Systolic BP 222 979 892  Diastolic BP 85 83 97  Wt. (Lbs) 178.4 - 188  BMI 31.6 - 33.3    Physical Exam  Eyes: Conjunctivae are normal. Pupils are equal, round, and reactive to light.  Neck: No JVD present.  Cardiovascular: Normal rate, regular rhythm, normal heart sounds and intact distal pulses.   Pulmonary/Chest: Effort normal and breath sounds normal.  Abdominal: Soft. Bowel sounds are normal.  Skin: Skin is warm and dry.  Nursing note and vitals reviewed.  Assessment & Plan:   Problem List Items Addressed This Visit      Cardiovascular and Mediastinum   Hypertension   Relevant Medications   lisinopril (PRINIVIL,ZESTRIL) 20 MG tablet     Endocrine   Uncontrolled type 2 diabetes mellitus with complication (HCC) - Primary   Relevant Medications   glucose blood test strip   insulin aspart (novoLOG) injection 10 Units (Completed)   insulin aspart (novoLOG) injection 5 Units (Completed)   insulin glargine (LANTUS) 100 UNIT/ML injection   lisinopril (PRINIVIL,ZESTRIL) 20 MG tablet   metFORMIN (GLUCOPHAGE) 1000 MG tablet   Other Relevant Orders   POCT glycosylated hemoglobin (Hb  A1C) (Completed)   Glucose (CBG) (Completed)   BASIC METABOLIC PANEL WITH GFR (Completed)   Lipid Panel (Completed)   Microalbumin/Creatinine Ratio, Urine (Completed)   Urine cytology ancillary only   Glucose (CBG) (Completed)   Urinalysis Dipstick (Completed)   Urinalysis Dipstick (Completed)   Glucose (CBG) (Completed)     Other   Depression   Relevant Medications   buPROPion (WELLBUTRIN SR) 150 MG 12 hr tablet    Other Visit Diagnoses    Skin rash in pelvic region       Relevant Orders   Urine cytology ancillary only     Meds ordered this encounter  Medications  . DISCONTD: lisinopril (PRINIVIL,ZESTRIL) 20 MG tablet    Sig: Take 1 tablet (20 mg total) by mouth daily.    Dispense:  30 tablet    Refill:  2    Order Specific Question:   Supervising Provider    Answer:   Quentin Angst [id number]  . buPROPion (WELLBUTRIN SR) 150 MG 12 hr tablet    Sig: Take 1 tablet (150 mg total) by mouth 2 (two) times daily.    Dispense:  60 tablet    Refill:  2    Order Specific Question:   Supervising Provider    Answer:   Quentin Angst [id number]  . DISCONTD: metFORMIN (GLUCOPHAGE) 1000 MG tablet    Sig: Take 1 tablet (1,000 mg total) by mouth 2 (two) times daily with a meal.    Dispense:  60 tablet    Refill:  2    Order Specific Question:   Supervising Provider    Answer:   Quentin Angst [id number]  . DISCONTD: insulin glargine (LANTUS) 100 UNIT/ML injection    Sig: Inject 0.1 mLs (10 Units total) into the skin at bedtime.    Dispense:  10 mL    Refill:  11    Order Specific Question:   Supervising Provider    Answer:   Quentin Angst [id number]  . Insulin Syringes, Disposable, U-100 1 ML MISC    Sig: 1 kit by Does not apply route once.    Dispense:  100 each    Refill:  11    Order Specific Question:   Supervising Provider    Answer:   Quentin Angst [id number]  . Blood Glucose Monitoring Suppl (TRUE METRIX METER) w/Device KIT    Sig: 1 Device  by Does not apply route once.    Dispense:  1 kit    Refill:  0    Order Specific Question:   Supervising Provider    Answer:   Quentin Angst [id number]  . TRUEPLUS LANCETS 28G MISC    Sig: 1 kit by Does not apply route once.    Dispense:  100 each    Refill:  12    Order  Specific Question:   Supervising Provider    Answer:   Tresa Garter [id number]  . glucose blood test strip    Sig: Use as instructed    Dispense:  100 each    Refill:  12    Order Specific Question:   Supervising Provider    Answer:   Tresa Garter [[id number]]  . insulin aspart (novoLOG) injection 10 Units  . insulin aspart (novoLOG) injection 5 Units  . insulin glargine (LANTUS) 100 UNIT/ML injection    Sig: Inject 0.1 mLs (10 Units total) into the skin at bedtime.    Dispense:  10 mL    Refill:  11    Order Specific Question:   Supervising Provider    Answer:   Tresa Garter [id number]  . lisinopril (PRINIVIL,ZESTRIL) 20 MG tablet    Sig: Take 1 tablet (20 mg total) by mouth daily.    Dispense:  30 tablet    Refill:  2    Order Specific Question:   Supervising Provider    Answer:   Tresa Garter [id number]  . metFORMIN (GLUCOPHAGE) 1000 MG tablet    Sig: Take 1 tablet (1,000 mg total) by mouth 2 (two) times daily with a meal.    Dispense:  60 tablet    Refill:  2    Order Specific Question:   Supervising Provider    Answer:   Tresa Garter [[id number]]    Follow-up: Return in about 2 weeks (around 08/06/2016) for diabetes with clinical pharmacist.   [name] FNP

## 2016-07-23 NOTE — Patient Instructions (Addendum)
If you develop any difficulty breathing, drowsiness, confusion, nausea, vomiting, or fatigue to to the Emergency Department.    Apply for orange card. Start taking blood sugars three times a day before meals and  Keep a log of your blood sugars to bring to your next visit.   Type 2 Diabetes Mellitus, Self Care, Adult When you have type 2 diabetes (type 2 diabetes mellitus), you must keep your blood sugar (glucose) under control. You can do this with:  Nutrition.  Exercise.  Lifestyle changes.  Medicines or insulin, if needed.  Support from your doctors and others. How do I manage my blood sugar?  Check your blood sugar level every day, as often as told.  Call your doctor if your blood sugar is above your goal numbers for 2 tests in a row.  Have your A1c (hemoglobin A1c) level checked at least two times a year. Have it checked more often if your doctor tells you to. Your doctor will set treatment goals for you. Generally, you should have these blood sugar levels:  Before meals (preprandial): 80-130 mg/dL (4.4-7.2 mmol/L).  After meals (postprandial): lower than 180 mg/dL (10 mmol/L).  A1c level: less than 7%. What do I need to know about high blood sugar? High blood sugar is called hyperglycemia. Know the signs of high blood sugar. Signs may include:  Feeling:  Thirsty.  Hungry.  Very tired.  Needing to pee (urinate) more than usual.  Blurry vision. What do I need to know about low blood sugar? Low blood sugar is called hypoglycemia. This is when blood sugar is at or below 70 mg/dL (3.9 mmol/L). Symptoms may include:  Feeling:  Hungry.  Worried or nervous (anxious).  Sweaty and clammy.  Confused.  Dizzy.  Sleepy.  Sick to your stomach (nauseous).  Having:  A fast heartbeat (palpitations).  A headache.  A change in your vision.  Jerky movements that you cannot control (seizure).  Nightmares.  Tingling or no feeling (numbness) around the  mouth, lips, or tongue.  Having trouble with:  Talking.  Paying attention (concentrating).  Moving (coordination).  Sleeping.  Shaking.  Passing out (fainting).  Getting upset easily (irritability). Treating low blood sugar   To treat low blood sugar, eat or drink something sugary right away. If you can think clearly and swallow safely, follow the 15:15 rule:  Take 15 grams of a fast-acting carb (carbohydrate). Some fast-acting carbs are:  1 tube of glucose gel.  3 sugar tablets (glucose pills).  6-8 pieces of hard candy.  4 oz (120 mL) of fruit juice.  4 oz (120 mL) regular (not diet) soda.  Check your blood sugar 15 minutes after you take the carb.  If your blood sugar is still at or below 70 mg/dL (3.9 mmol/L), take 15 grams of a carb again.  If your blood sugar does not go above 70 mg/dL (3.9 mmol/L) after 3 tries, get help right away.  After your blood sugar goes back to normal, eat a meal or a snack within 1 hour. Treating very low blood sugar  If your blood sugar is at or below 54 mg/dL (3 mmol/L), you have very low blood sugar (severe hypoglycemia). This is an emergency. Do not wait to see if the symptoms will go away. Get medical help right away. Call your local emergency services (911 in the [place] Do not drive yourself to the hospital. If you have very low blood sugar and you cannot eat or drink, you may need  a glucagon shot (injection). A family member or friend should learn how to check your blood sugar and how to give you a glucagon shot. Ask your doctor if you need to have a glucagon shot kit at home. What else is important to manage my diabetes? Medicine  Follow these instructions about insulin and diabetes medicines:  Take them as told by your doctor.  Adjust them as told by your doctor.  Do not run out of them. Having diabetes can raise your risk for other long-term conditions. These include heart or kidney disease. Your doctor may prescribe  medicines to help prevent problems from diabetes. Food    Make healthy food choices. These include:  Chicken, fish, egg whites, and beans.  Oats, whole wheat, bulgur, brown rice, quinoa, and millet.  Fresh fruits and vegetables.  Low-fat dairy products.  Nuts, avocado, olive oil, and canola oil.  Make a food plan with a specialist (dietitian).  Follow instructions from your doctor about what you cannot eat or drink.  Drink enough fluid to keep your pee (urine) clear or pale yellow.  Eat healthy snacks between healthy meals.  Keep track of carbs that you eat. Read food labels. Learn food serving sizes.  Follow your sick day plan when you cannot eat or drink normally. Make this plan with your doctor so it is ready to use. Activity   Exercise at least 3 times a week.  Do not go more than 2 days without exercising.  Talk with your doctor before you start a new exercise. Your doctor may need to adjust your insulin, medicines, or food. Lifestyle    Do not use any tobacco products. These include cigarettes, chewing tobacco, and e-cigarettes.If you need help quitting, ask your doctor.  Ask your doctor how much alcohol is safe for you.  Learn to deal with stress. If you need help with this, ask your doctor. Body care   Stay up to date with your shots (immunizations).  Have your eyes and feet checked by a doctor as often as told.  Check your skin and feet every day. Check for cuts, bruises, redness, blisters, or sores.  Brush your teeth and gums two times a day.  Floss at least one time a day.  Go to the dentist least one time every 6 months.  Stay at a healthy weight. General instructions    Take over-the-counter and prescription medicines only as told by your doctor.  Share your diabetes care plan with:  Your work or school.  People you live with.  Check your pee (urine) for ketones:  When you are sick.  As told by your doctor.  Carry a card or wear  jewelry that says that you have diabetes.  Ask your doctor:  Do I need to meet with a diabetes educator?  Where can I find a support group for people with diabetes?  Keep all follow-up visits as told by your doctor. This is important. Where to find more information: To learn more about diabetes, visit:  American Diabetes Association: www.diabetes.org  American Association of Diabetes Educators: www.diabeteseducator.org/patient-resources This information is not intended to replace advice given to you by your health care provider. Make sure you discuss any questions you have with your health care provider. Document Released: 09/01/2015 Document Revised: 10/16/2015 Document Reviewed: 06/13/2015 Elsevier Interactive Patient Education  2017 [employer].

## 2016-07-24 LAB — MICROALBUMIN / CREATININE URINE RATIO
Creatinine, Urine: 47 mg/dL (ref 20–320)
Microalb Creat Ratio: 296 mcg/mg creat — ABNORMAL HIGH (ref <30)
Microalb, Ur: 13.9 mg/dL

## 2016-07-26 MED FILL — BUPROPION SR 150 MG TABLET: 150 | 30 days supply | Qty: 60 | Fill #0

## 2016-07-26 MED FILL — TRUEPLUS SYR 1ML 31GX5/16": 31G X 5/16" | 30 days supply | Qty: 100 | Fill #0

## 2016-07-26 MED FILL — metFORMIN HCL 1000 MG TABS: 1000 | 30 days supply | Qty: 60 | Fill #0

## 2016-07-26 MED FILL — TRUEplus LANCETS 28G MISC: 30 days supply | Qty: 100 | Fill #0

## 2016-07-26 MED FILL — TRUEPLUS SYR 1ML 31GX5/16: 31G X 5/16" | 30 days supply | Qty: 100 | Fill #0

## 2016-07-26 MED FILL — !LANTUS 100 UNITS/ML VIAL: 100 | 28 days supply | Qty: 10 | Fill #0

## 2016-07-26 MED FILL — TRUE METRIX TEST STRIP: 30 days supply | Qty: 100 | Fill #0

## 2016-07-26 MED FILL — ?LISINOPRIL 20 MG TABLET: 20 | 30 days supply | Qty: 30 | Fill #0

## 2016-07-26 MED FILL — TRUE METRIX BLOOD GLUCOSE M: W/DEVICE | 30 days supply | Qty: 1 | Fill #0

## 2016-07-29 LAB — URINE CYTOLOGY ANCILLARY ONLY
Bacterial vaginitis: NEGATIVE
Candida vaginitis: POSITIVE — AB

## 2016-08-02 ENCOUNTER — Other Ambulatory Visit: Payer: Self-pay | Admitting: Family Medicine

## 2016-08-02 ENCOUNTER — Telehealth: Payer: Self-pay

## 2016-08-02 DIAGNOSIS — E782 Mixed hyperlipidemia: Secondary | ICD-10-CM

## 2016-08-02 DIAGNOSIS — I10 Essential (primary) hypertension: Secondary | ICD-10-CM

## 2016-08-02 DIAGNOSIS — B3731 Acute candidiasis of vulva and vagina: Secondary | ICD-10-CM

## 2016-08-02 DIAGNOSIS — E1165 Type 2 diabetes mellitus with hyperglycemia: Secondary | ICD-10-CM

## 2016-08-02 DIAGNOSIS — B373 Candidiasis of vulva and vagina: Secondary | ICD-10-CM

## 2016-08-02 DIAGNOSIS — E118 Type 2 diabetes mellitus with unspecified complications: Secondary | ICD-10-CM

## 2016-08-02 MED ORDER — ATORVASTATIN CALCIUM 40 MG PO TABS
40.0000 mg | ORAL_TABLET | Freq: Every day | ORAL | 0 refills | Status: DC
Start: 2016-08-02 — End: 2016-09-22

## 2016-08-02 MED ORDER — METFORMIN HCL 1000 MG PO TABS: 1000.0000 mg | ORAL_TABLET | Freq: Two times a day (BID) | ORAL | 2 refills | Status: DC

## 2016-08-02 MED ORDER — LISINOPRIL 20 MG PO TABS: 20.0000 mg | ORAL_TABLET | Freq: Every day | ORAL | 2 refills | Status: DC

## 2016-08-02 MED ORDER — INSULIN GLARGINE 100 UNIT/ML ~~LOC~~ SOLN: 10.0000 [IU] | Freq: Every day | SUBCUTANEOUS | 11 refills | Status: DC

## 2016-08-02 MED ORDER — FLUCONAZOLE 150 MG PO TABS: 150.0000 mg | ORAL_TABLET | Freq: Once | ORAL | 0 refills | Status: AC

## 2016-08-02 MED FILL — ATORVASTATIN 40 MG TABLET: 40 | 30 days supply | Qty: 30 | Fill #0

## 2016-08-02 MED FILL — FLUCONAZOLE 150 MG TABLET: 150 | 1 days supply | Qty: 1 | Fill #0

## 2016-08-02 NOTE — Telephone Encounter (Signed)
-----   Message from Lizbeth BarkMandesia R Hairston, FNP sent at 08/02/2016 10:42 AM EDT ----- Positive for yeast infection. You have been prescribed diflucan to treat. To prevent yeast infections avoid douching, using scented soaps or sprays, wear cotton underwear.  Microalbumin/creatinine ratio level was elevated. This tests for protein in your urine that could indicate early signs of kidney damage. Take your lisinopril .  Lipid levels were elevated. This can increase your risk of heart disease overtime. Triglyceride levels were elevated. Your dose of atorvastatin was increased to help lower these levels. Recheck lipid in 3 months.  Start eating a diet low in saturated fat. Limit your intake of fried foods, red meats, and whole milk. Increase activity.

## 2016-08-02 NOTE — Telephone Encounter (Signed)
CMA call to go over lab results  Patient Verify DOB  Patient was aware and understood   

## 2016-08-05 ENCOUNTER — Ambulatory Visit: Payer: Self-pay | Attending: Family Medicine | Admitting: Pharmacist

## 2016-08-05 ENCOUNTER — Ambulatory Visit: Payer: Self-pay | Admitting: Licensed Clinical Social Worker

## 2016-08-05 DIAGNOSIS — Z794 Long term (current) use of insulin: Secondary | ICD-10-CM | POA: Insufficient documentation

## 2016-08-05 DIAGNOSIS — E1165 Type 2 diabetes mellitus with hyperglycemia: Secondary | ICD-10-CM

## 2016-08-05 DIAGNOSIS — F329 Major depressive disorder, single episode, unspecified: Secondary | ICD-10-CM

## 2016-08-05 DIAGNOSIS — Z79899 Other long term (current) drug therapy: Secondary | ICD-10-CM

## 2016-08-05 DIAGNOSIS — E1140 Type 2 diabetes mellitus with diabetic neuropathy, unspecified: Secondary | ICD-10-CM | POA: Insufficient documentation

## 2016-08-05 DIAGNOSIS — F32A Depression, unspecified: Secondary | ICD-10-CM

## 2016-08-05 DIAGNOSIS — E118 Type 2 diabetes mellitus with unspecified complications: Secondary | ICD-10-CM

## 2016-08-05 MED ORDER — SITAGLIPTIN PHOS-METFORMIN HCL 50-1000 MG PO TABS: 1.0000 | ORAL_TABLET | Freq: Two times a day (BID) | ORAL | 3 refills | Status: DC

## 2016-08-05 MED ORDER — INSULIN GLARGINE 100 UNIT/ML ~~LOC~~ SOLN: 20.0000 [IU] | Freq: Every day | SUBCUTANEOUS | 3 refills | Status: DC

## 2016-08-05 NOTE — BH Specialist Note (Signed)
Integrated Behavioral Health Initial Visit  MRN: [id number] Name: Sarah AmmonsLaura Charles   Session Start time: 11:30 AM Session End time: 11:50 AM Total time: 20 minutes  Type of Service: Integrated Behavioral Health- Individual/Family Interpretor:No. Interpretor Name and Language: N/A   Warm Hand Off Completed.       SUBJECTIVE: Sarah AmmonsLaura Charles is a 56 y.o. female accompanied by patient. [facility]. Patient reports the following symptoms/concerns: overwhelming feelings of sadness, withdrawn behavior, difficulty sleeping, difficulty concentrating, racing thoughts, low energy/motivation, and hx of panic attacks Duration of problem: Ongoing ; Severity of problem: moderate  OBJECTIVE: Mood: Depressed and Affect: Tearful Risk of harm to self or others: No plan to harm self or others   LIFE CONTEXT: Family and Social: Patient is residing with best friend while she is in the process of separating from husband. She has two adult children 69(29 yo son and 56 yo daughter) who reside nearby and provide emotional support School/Work: Pt has been unemployed for approximately 9 years. She has made attempts to obtain employment; however, is interested in applying for SSI due to inability to work at present time. Pt receives food stamps [phone]($198) Self-Care: Pt has decreased in tobacco use. She reported smoking 1.5 packs to .5 packs daily. Pt drinks alcohol "occasionally" Life Changes: Pt is separated from husband who was the primary breadwinner. She is experiencing financial strain.  GOALS ADDRESSED: Patient will reduce symptoms of: depression and increase knowledge and/or ability of: coping skills and also: Increase healthy adjustment to current life circumstances and Increase adequate support systems for patient/family   INTERVENTIONS: Solution-Focused Strategies and Supportive Counseling  Standardized Assessments completed: Not provided  ASSESSMENT: Patient currently  experiencing depression and anxiety triggered by current separation from spouse and financial strain. Pt reports overwhelming feelings of sadness, withdrawn behavior, difficulty sleeping, difficulty concentrating, racing thoughts, low energy/motivation, and hx of panic attacks. She receives support from her best friend and adult children. Patient may benefit from psychotherapy and medication management. Pt shared that she is compliant with medication management; however, has concerns that it is not as effective. She has decreased tobacco use and has reached out to family and friends for support. Pt has obtained a lawyer to assist with disability application and has agreed to schedule appointment with Provider to discuss medication.   PLAN: 1. Follow up with behavioral health clinician on : Pt was encouraged tocontact LCSWA if symptoms worsen or fail to improveto schedule [facility] 2. Behavioral recommendations: Wellbutrin 3. Referral(s): Integrated Behavioral Health Services (In Clinic) 4. "From scale of 1-10, how likely are you to follow plan?": 7/10  Bridgett LarssonJasmine D Jerrin Recore, LCSW  08/05/16 5:34 PM

## 2016-08-05 NOTE — Progress Notes (Signed)
    S:     Chief Complaint  Patient presents with  . Medication Management    Patient arrives in good spirits.  Presents for diabetes evaluation, education, and management [facility]. Jegede. Patient was referred on 07/23/16.  Patient was last seen by Primary Care Provider on 07/23/16.   Patient reports Diabetes was diagnosed many years ago but she has not always follow up with medical care or taken her medications.  Patient reports adherence with medications.  Current diabetes medications include: Lantus 10 units daily and Metformin 1000 mg BID and Janumet 50-1000 mg daily.   Patient denies hypoglycemic events.  Patient reported exercise habits: does not exercise due to neuropathy.   Patient reports nocturia 5 times per night.  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 Patient denies visual changes. Patient reports self foot exams.   Patient also reports depression and is tearful during visit. She would like to speak to someone today about her depression.   O:  Physical Exam   ROS   Lab Results  Component Value Date   HGBA1C 13.2 07/23/2016   There were no vitals filed for this visit.  Home fasting CBG: 300-500 2 hour post-prandial/random CBG: 300-500  A/P: Diabetes longstanding currently UNcontrolled based on A1c of 13.2. Patient denies hypoglycemic events and is able to verbalize appropriate hypoglycemia management plan. Patient reports adherence with medication. Control is suboptimal due to history of nonadherence to medications, dietary indiscretion, and sedentary lifestyle.  Stop metformin. Increase Janumet to 2 tablets daily and increase Lantus to 20 units daily. Encouraged patient to work hard on getting her blood glucose controlled as this will help lower the severity of the neuropathy.  Ended visit early to allow for patient to meet with Jenel LucksJasmine Lewis, LCSW, for depression. [facility]. Duloxetine would be ideal to treat the depression and nerve pain but she didn't tolerate it. Consider gabapentin. Bupropion will not start to help with depression for 6-8 weeks after initiation so it will still take some time to see the effects of it. Reinforced that the key would be to get her blood glucose controlled.  Next A1C anticipated June 2018.    Written patient instructions provided.  Total time in face to face counseling 20 minutes.   Follow up in Pharmacist Clinic Visit PRN.   Patient seen with Cheral Bayasey Wells, PharmD Candidate

## 2016-08-05 NOTE — Patient Instructions (Addendum)
Thanks for coming to see us!  Increase Lantus to 20 units daily.  Increase Janumet to 2 tablets daily. I have sent this to our pharmacy.  Follow up with Arrie SenateMandesia Hairston in 1-2 weeks for nerve pain

## 2016-08-06 MED FILL — JANUMET 50-1,000 MG TABLET: 50-1000 | 30 days supply | Qty: 60 | Fill #0

## 2016-08-11 ENCOUNTER — Ambulatory Visit: Payer: Self-pay | Attending: Family Medicine | Admitting: Family Medicine

## 2016-08-11 ENCOUNTER — Encounter: Payer: Self-pay | Admitting: Family Medicine

## 2016-08-11 VITALS — BP 152/87 | HR 70 | Temp 94.5°F | Resp 18 | Ht 63.0 in | Wt 181.2 lb

## 2016-08-11 DIAGNOSIS — E118 Type 2 diabetes mellitus with unspecified complications: Secondary | ICD-10-CM

## 2016-08-11 DIAGNOSIS — E1165 Type 2 diabetes mellitus with hyperglycemia: Secondary | ICD-10-CM | POA: Insufficient documentation

## 2016-08-11 DIAGNOSIS — Z0001 Encounter for general adult medical examination with abnormal findings: Secondary | ICD-10-CM | POA: Insufficient documentation

## 2016-08-11 DIAGNOSIS — Z23 Encounter for immunization: Secondary | ICD-10-CM

## 2016-08-11 DIAGNOSIS — I10 Essential (primary) hypertension: Secondary | ICD-10-CM | POA: Insufficient documentation

## 2016-08-11 DIAGNOSIS — Z794 Long term (current) use of insulin: Secondary | ICD-10-CM | POA: Insufficient documentation

## 2016-08-11 DIAGNOSIS — E1142 Type 2 diabetes mellitus with diabetic polyneuropathy: Secondary | ICD-10-CM | POA: Insufficient documentation

## 2016-08-11 DIAGNOSIS — Z79899 Other long term (current) drug therapy: Secondary | ICD-10-CM | POA: Insufficient documentation

## 2016-08-11 LAB — GLUCOSE, POCT (MANUAL RESULT ENTRY): POC Glucose: 291 mg/dl — AB (ref 70–99)

## 2016-08-11 MED ORDER — GABAPENTIN 300 MG PO CAPS: 300.0000 mg | ORAL_CAPSULE | Freq: Every day | ORAL | 2 refills | Status: DC

## 2016-08-11 MED FILL — GABAPENTIN 300 MG CAPSULE: 300 | 30 days supply | Qty: 30 | Fill #0

## 2016-08-11 NOTE — Patient Instructions (Addendum)
Keep log of your blood sugars to bring to next office visit. Be consistent with taking your diabetic medications. Limit your intake of fried foods.   Diabetic Neuropathy Diabetic neuropathy is a nerve disease or nerve damage that is caused by diabetes mellitus. About half of all people with diabetes mellitus have some form of nerve damage. Nerve damage is more common in those who have had diabetes mellitus for many years and who generally have not had good control of their blood sugar (glucose) level. Diabetic neuropathy is a common complication of diabetes mellitus. There are three common types of diabetic neuropathy and a fourth type that is less common and less understood:  Peripheral neuropathy-This is the most common type of diabetic neuropathy. It causes damage to the nerves of the feet and legs first and then eventually the hands and arms. The damage affects the ability to sense touch.  Autonomic neuropathy-This type causes damage to the autonomic nervous system, which controls the following functions:  Heartbeat.  Body temperature.  Blood pressure.  Urination.  Digestion.  Sweating.  Sexual function.  Focal neuropathy-Focal neuropathy can be painful and unpredictable and occurs most often in older adults with diabetes mellitus. It involves a specific nerve or one area and often comes on suddenly. It usually does not cause long-term problems.  Radiculoplexus neuropathy- Sometimes called lumbosacral radiculoplexus neuropathy, radiculoplexus neuropathy affects the nerves of the thighs, hips, buttocks, or legs. It is more common in people with type 2 diabetes mellitus and in older men. It is characterized by debilitating pain, weakness, and atrophy, usually in the thigh muscles. What are the causes? The cause of peripheral, autonomic, and focal neuropathies is diabetes mellitus that is uncontrolled and high glucose levels. The cause of radiculoplexus neuropathy is unknown. However,  it is thought to be caused by inflammation related to uncontrolled glucose levels. What are the signs or symptoms? Peripheral Neuropathy  Peripheral neuropathy develops slowly over time. When the nerves of the feet and legs no longer work there may be:  Burning, stabbing, or aching pain in the legs or feet.  Inability to feel pressure or pain in your feet. This can lead to:  Thick calluses over pressure areas.  Pressure sores.  Ulcers.  Foot deformities.  Reduced ability to feel temperature changes.  Muscle weakness. Autonomic Neuropathy  The symptoms of autonomic neuropathy vary depending on which nerves are affected. Symptoms may include:  Problems with digestion, such as:  Feeling sick to your stomach (nausea).  Vomiting.  Bloating.  Constipation.  Diarrhea.  Abdominal pain.  Difficulty with urination. This occurs if you lose your ability to sense when your bladder is full. Problems include:  Urine leakage (incontinence).  Inability to empty your bladder completely (retention).  Rapid or irregular heartbeat (palpitations).  Blood pressure drops when you stand up (orthostatic hypotension). When you stand up you may feel:  Dizzy.  Weak.  Faint.  In men, inability to attain and maintain an erection.  In women, vaginal dryness and problems with decreased sexual desire and arousal.  Problems with body temperature regulation.  Increased or decreased sweating. Focal Neuropathy   Abnormal eye movements or abnormal alignment of both eyes.  Weakness in the wrist.  Foot drop. This results in an inability to lift the foot properly and abnormal walking or foot movement.  Paralysis on one side of your face (Bell palsy).  Chest or abdominal pain. Radiculoplexus Neuropathy   Sudden, severe pain in your hip, thigh, or buttocks.  Weakness  and wasting of thigh muscles.  Difficulty rising from a seated position.  Abdominal swelling.  Unexplained weight  loss (usually more than 10 lb [4.5 kg]). How is this diagnosed? Peripheral Neuropathy  Your senses may be tested. Sensory function testing can be done with:  A light touch using a monofilament.  A vibration with tuning fork.  A [place] sensation with a pin prick. Other tests that can help diagnose neuropathy are:  Nerve conduction velocity. This test checks the transmission of an electrical current through a nerve.  Electromyography. This shows how muscles respond to electrical signals transmitted by nearby nerves.  Quantitative sensory testing. This is used to assess how your nerves respond to vibrations and changes in temperature. Autonomic Neuropathy  Diagnosis is often based on reported symptoms. Tell your health care provider if you experience:  Dizziness.  Constipation.  Diarrhea.  Inappropriate urination or inability to urinate.  Inability to get or maintain an erection. Tests that may be done include:  Electrocardiography or Holter monitor. These are tests that can help show problems with the heart rate or heart rhythm.  An X-ray exam may be done. Focal Neuropathy  Diagnosis is made based on your symptoms and what your health care provider finds during your exam. Other tests may be done. They may include:  Nerve conduction velocities. This checks the transmission of electrical current through a nerve.  Electromyography. This shows how muscles respond to electrical signals transmitted by nearby nerves.  Quantitative sensory testing. This test is used to assess how your nerves respond to vibration and changes in temperature. Radiculoplexus Neuropathy   Often the first thing is to eliminate any other issue or problems that might be the cause, as there is no standard test for diagnosis.  X-ray exam of your spine and lumbar region.  Spinal tap to rule out cancer.  MRI to rule out other lesions. How is this treated? Once nerve damage occurs, it cannot be reversed.  The goal of treatment is to keep the disease or nerve damage from getting worse and affecting more nerve fibers. Controlling your blood glucose level is the key. Most people with radiculoplexus neuropathy see at least a partial improvement over time. You will need to keep your blood glucose and HbA1c levels in the target range determined by your health care provider. Things that help control blood glucose levels include:  Blood glucose monitoring.  Meal planning.  Physical activity.  Diabetes medicine. Over time, maintaining lower blood glucose levels helps lessen symptoms. Sometimes, prescription pain medicine is needed. Follow these instructions at home:  Do not smoke.  Keep your blood glucose level in the range that you and your health care provider have determined acceptable for you.  Keep your blood pressure level in the range that you and your health care provider have determined acceptable for you.  Eat a well-balanced diet.  Be physically active every day. Include strength training and balance exercises.  Protect your feet.  Check your feet every day for sores, cuts, blisters, or signs of infection.  Wear padded socks and supportive shoes. Use orthotic inserts, if necessary.  Regularly check the insides of your shoes for worn spots. Make sure there are no rocks or other items inside your shoes before you put them on. Contact a health care provider if:  You have burning, stabbing, or aching pain in the legs or feet.  You are unable to feel pressure or pain in your feet.  You develop problems with digestion such  as:  Nausea.  Vomiting.  Bloating.  Constipation.  Diarrhea.  Abdominal pain.  You have difficulty with urination, such as:  Incontinence.  Retention.  You have palpitations.  You develop orthostatic hypotension. When you stand up you may feel:  Dizzy.  Weak.  Faint.  You cannot attain and maintain an erection (in men).  You have  vaginal dryness and problems with decreased sexual desire and arousal (in women).  You have severe pain in your thighs, legs, or buttocks.  You have unexplained weight loss. This information is not intended to replace advice given to you by your health care provider. Make sure you discuss any questions you have with your health care provider. Document Released: 07/19/2001 Document Revised: 10/16/2015 Document Reviewed: 10/19/2012 Elsevier Interactive Patient Education  2017 [employer]

## 2016-08-11 NOTE — Progress Notes (Signed)
Patient is her for lower stomach pain  Patient requested hysterectomy  Patient has not taking her meds for today & has not eaten for today

## 2016-08-11 NOTE — Progress Notes (Signed)
_  Subjective:  Patient ID: Sarah Charles, female    DOB: 1961-03-01  Age: 56 y.o. MRN: [id number]  CC: No chief complaint on file.   HPI Donyae Kohn presents for   Nerve pain: History of DM 2 and non-adherence with DM / HTN medications. Reports not taking her medication today. Reports 6 month history of paresthesias to the BUE/BLE. Denies any wounds. Reports checking blood sugars twice a day. CBG's range between 200-250's. When asked about adherence with medication patient states " Sometimes I may miss a few days". Emphasized the importance of being consistent with medication use.   Depression: Reports taking Wellbutrin. Denies any SI/HI.    Outpatient Medications Prior to Visit  Medication Sig Dispense Refill  . atorvastatin (LIPITOR) 40 MG tablet Take 1 tablet (40 mg total) by mouth daily. 90 tablet 0  . buPROPion (WELLBUTRIN SR) 150 MG 12 hr tablet Take 1 tablet (150 mg total) by mouth 2 (two) times daily. 60 tablet 2  . glucose blood test strip Use as instructed 100 each 12  . ibuprofen (ADVIL,MOTRIN) 600 MG tablet Take 1 tablet (600 mg total) by mouth every 6 (six) hours as needed. 30 tablet 0  . insulin glargine (LANTUS) 100 UNIT/ML injection Inject 10 Units into the skin daily as needed (for high blood sugars).     . insulin glargine (LANTUS) 100 UNIT/ML injection Inject 0.2 mLs (20 Units total) into the skin at bedtime. 10 mL 3  . lisinopril (PRINIVIL,ZESTRIL) 20 MG tablet Take 1 tablet (20 mg total) by mouth daily. 30 tablet 2  . lovastatin (MEVACOR) 20 MG tablet Take 20 mg by mouth daily.    . metFORMIN (GLUCOPHAGE) 1000 MG tablet Take 1 tablet (1,000 mg total) by mouth 2 (two) times daily with a meal. 60 tablet 2  . nystatin cream (MYCOSTATIN) Apply 1 application topically 2 (two) times daily. Apply to affected areas. For 2 weeks. 30 g 0  . oseltamivir (TAMIFLU) 75 MG capsule Take 1 capsule (75 mg total) by mouth every 12 (twelve) hours. 10 capsule 0  . rosuvastatin (CRESTOR) 20  MG tablet Take 20 mg by mouth daily.    . sitaGLIPtin-metformin (JANUMET) 50-1000 MG tablet Take 1 tablet by mouth 2 (two) times daily with a meal. 60 tablet 3   No facility-administered medications prior to visit.     ROS Review of Systems  Eyes: Negative.   Respiratory: Negative.   Cardiovascular: Negative.   Gastrointestinal: Negative.   Skin: Negative.   Neurological:       Parathesias  Psychiatric/Behavioral: Negative for suicidal ideas.    Objective:  BP (!) 152/87 (BP Location: Left Arm, Patient Position: Sitting, Cuff Size: Normal)   Pulse 70   Temp (!) 94.5 F (34.7 C) (Oral)   Resp 18   Ht 5\' 3"  (1.6 m)   Wt 181 lb 3.2 oz (82.2 kg)   SpO2 97%   BMI 32.10 kg/m   BP/Weight 08/11/2016 07/23/2016 07/14/2016  Systolic BP 152 155 137  Diastolic BP 87 85 83  Wt. (Lbs) 181.2 178.4 -  BMI 32.1 31.6 -   Physical Exam  Eyes: Conjunctivae are normal. Pupils are equal, round, and reactive to light.  Neck: No JVD present.  Cardiovascular: Normal rate, regular rhythm, normal heart sounds and intact distal pulses.   Pulmonary/Chest: Effort normal and breath sounds normal.  Abdominal: Soft. Bowel sounds are normal.  Skin: Skin is warm and dry.  Psychiatric: She expresses no homicidal and no suicidal  ideation. She expresses no suicidal plans and no homicidal plans.  Nursing note and vitals reviewed.  Diabetic Foot Exam - Simple   Simple Foot Form Diabetic Foot exam was performed with the following findings:  Yes 08/11/2016 11:47 AM  Visual Inspection No deformities, no ulcerations, no other skin breakdown bilaterally:  Yes Sensation Testing Intact to touch and monofilament testing bilaterally:  Yes Pulse Check Posterior Tibialis and Dorsalis pulse intact bilaterally:  Yes Comments     Assessment & Plan:   Problem List Items Addressed This Visit      Endocrine   Uncontrolled type 2 diabetes mellitus with complication (HCC)   Relevant Orders   Glucose (CBG)  (Completed)    Other Visit Diagnoses    Diabetic polyneuropathy associated with type 2 diabetes mellitus (HCC)    -  Primary   Relevant Medications   gabapentin (NEURONTIN) 300 MG capsule      Meds ordered this encounter  Medications  . gabapentin (NEURONTIN) 300 MG capsule    Sig: Take 1 capsule (300 mg total) by mouth at bedtime.    Dispense:  30 capsule    Refill:  2    Order Specific Question:   Supervising Provider    Answer:   Quentin AngstJEGEDE, OLUGBEMIGA E [[id number]]    Follow-up: Return in about 5 weeks (around 09/15/2016) for Depression & Diabetes .   Lizbeth BarkMandesia R Ruger Saxer FNP

## 2016-08-12 ENCOUNTER — Telehealth: Payer: Self-pay | Admitting: Family Medicine

## 2016-08-12 ENCOUNTER — Other Ambulatory Visit: Payer: Self-pay | Admitting: Family Medicine

## 2016-08-12 NOTE — Telephone Encounter (Signed)
CMA return patient call to gather more information of what the patient is requesting   Patient inform that they offer her the pneumonia shot but the pcp didn't gave it to her so she asking ifs he can get it on Monday that she has a financial appointment

## 2016-08-12 NOTE — Telephone Encounter (Signed)
Patient called the office to speak with nurse regarding shots that she to have gotten on yesterday's visit 3/21. Patient wants to know if she can get them done on Monday 3/26 . Please follow up.   Thank you.

## 2016-08-12 NOTE — Telephone Encounter (Signed)
I already spoke with the patient she stated that you offer her the pneumonia shot & that nobody gave it to her so she asking if she can get on Monday that she having her financial appt, I told her she can do a nurse visit  so she can get it

## 2016-08-12 NOTE — Telephone Encounter (Signed)
That is fine. She can come in on Monday.

## 2016-08-13 ENCOUNTER — Telehealth: Payer: Self-pay | Admitting: Family Medicine

## 2016-08-13 NOTE — Telephone Encounter (Signed)
Patient called the office in order to speak with PCP regarding having her write a letter to patient's lawyer stating that patient cannot work due to condition. Pt wants to present this letter to [employer]. Please follow up.  Thank you

## 2016-08-13 NOTE — Telephone Encounter (Signed)
Patient called the office in order to speak with PCP regarding having her write a letter to patient's lawyer stating that patient cannot work due to condition. Pt wants to present this letter to [employer]. Please follow up.  Thank you 

## 2016-08-16 ENCOUNTER — Ambulatory Visit: Payer: PRIVATE HEALTH INSURANCE

## 2016-08-16 NOTE — Addendum Note (Signed)
Addended by: Roger KillAVILA, Evon Lopezperez on: 08/16/2016 11:21 AM   Modules accepted: Orders

## 2016-08-17 ENCOUNTER — Other Ambulatory Visit: Payer: Self-pay | Admitting: Family Medicine

## 2016-08-17 NOTE — Telephone Encounter (Signed)
Letter written will leave CMA mailbox.

## 2016-08-18 NOTE — Telephone Encounter (Signed)
CMA call to inform patient that the letter is ready to pick up at the front desk  Patient Verify DOB  Patient was aware and understood

## 2016-08-23 ENCOUNTER — Other Ambulatory Visit: Payer: Self-pay | Admitting: Pharmacist

## 2016-08-23 DIAGNOSIS — I10 Essential (primary) hypertension: Secondary | ICD-10-CM

## 2016-08-23 DIAGNOSIS — E118 Type 2 diabetes mellitus with unspecified complications: Principal | ICD-10-CM

## 2016-08-23 DIAGNOSIS — E1165 Type 2 diabetes mellitus with hyperglycemia: Secondary | ICD-10-CM

## 2016-08-23 MED ORDER — LISINOPRIL 20 MG PO TABS: 20.0000 mg | ORAL_TABLET | Freq: Every day | ORAL | 0 refills | Status: DC

## 2016-08-23 MED ORDER — METFORMIN HCL 1000 MG PO TABS: 1000.0000 mg | ORAL_TABLET | Freq: Two times a day (BID) | ORAL | 0 refills | Status: DC

## 2016-08-25 ENCOUNTER — Encounter: Payer: Self-pay | Admitting: Family Medicine

## 2016-09-13 ENCOUNTER — Ambulatory Visit (HOSPITAL_BASED_OUTPATIENT_CLINIC_OR_DEPARTMENT_OTHER): Payer: PRIVATE HEALTH INSURANCE | Admitting: Family Medicine

## 2016-09-13 ENCOUNTER — Ambulatory Visit: Payer: PRIVATE HEALTH INSURANCE | Admitting: Family Medicine

## 2016-09-13 ENCOUNTER — Telehealth: Payer: Self-pay | Admitting: *Deleted

## 2016-09-13 NOTE — Telephone Encounter (Signed)
Reason for walkin: ;er pt. suspected tick bite, felt tick bite her, removed by pulling it out. Now she has a hole and it itches. She states she has body aches normally.  Pt has a scabbed area on the left side of her back on her bra line. Noticed a little pink color around that scabbed area. Does not feel warm to touch. She states area is slightly tender. Apt scheduled for today at 2:45pm. VS: T:97.8 P: 71  R:20  BP: 167/90  SpO2: 96%

## 2016-09-16 ENCOUNTER — Ambulatory Visit: Payer: Self-pay | Attending: Family Medicine | Admitting: Family Medicine

## 2016-09-16 ENCOUNTER — Encounter: Payer: Self-pay | Admitting: Family Medicine

## 2016-09-16 VITALS — BP 146/78 | HR 71 | Temp 97.4°F | Resp 18 | Ht 63.0 in | Wt 176.2 lb

## 2016-09-16 DIAGNOSIS — E118 Type 2 diabetes mellitus with unspecified complications: Secondary | ICD-10-CM

## 2016-09-16 DIAGNOSIS — F329 Major depressive disorder, single episode, unspecified: Secondary | ICD-10-CM | POA: Insufficient documentation

## 2016-09-16 DIAGNOSIS — E1165 Type 2 diabetes mellitus with hyperglycemia: Secondary | ICD-10-CM | POA: Insufficient documentation

## 2016-09-16 DIAGNOSIS — I10 Essential (primary) hypertension: Secondary | ICD-10-CM | POA: Insufficient documentation

## 2016-09-16 DIAGNOSIS — L03312 Cellulitis of back [any part except buttock]: Secondary | ICD-10-CM | POA: Insufficient documentation

## 2016-09-16 DIAGNOSIS — S30860A Insect bite (nonvenomous) of lower back and pelvis, initial encounter: Secondary | ICD-10-CM

## 2016-09-16 DIAGNOSIS — Z23 Encounter for immunization: Secondary | ICD-10-CM | POA: Insufficient documentation

## 2016-09-16 DIAGNOSIS — Z0000 Encounter for general adult medical examination without abnormal findings: Secondary | ICD-10-CM

## 2016-09-16 DIAGNOSIS — Z794 Long term (current) use of insulin: Secondary | ICD-10-CM | POA: Insufficient documentation

## 2016-09-16 DIAGNOSIS — W57XXXA Bitten or stung by nonvenomous insect and other nonvenomous arthropods, initial encounter: Secondary | ICD-10-CM | POA: Insufficient documentation

## 2016-09-16 DIAGNOSIS — Z79899 Other long term (current) drug therapy: Secondary | ICD-10-CM | POA: Insufficient documentation

## 2016-09-16 LAB — GLUCOSE, POCT (MANUAL RESULT ENTRY)
POC Glucose: 311 mg/dl — AB (ref 70–99)
POC Glucose: 286 mg/dl — AB (ref 70–99)
POC Glucose: 332 mg/dl — AB (ref 70–99)

## 2016-09-16 LAB — POCT UA - MICROALBUMIN
Creatinine, POC: 100 mg/dL
Microalbumin Ur, POC: 80 mg/L

## 2016-09-16 MED ORDER — PNEUMOCOCCAL VAC POLYVALENT 25 MCG/0.5ML IJ INJ
0.5000 mL | INJECTION | Freq: Once | INTRAMUSCULAR | Status: AC
  Administered 2016-09-16: 0.5 mL via INTRAMUSCULAR

## 2016-09-16 MED ORDER — INSULIN ASPART 100 UNIT/ML ~~LOC~~ SOLN
10.0000 [IU] | Freq: Once | SUBCUTANEOUS | Status: AC
  Administered 2016-09-16: 10 [IU] via SUBCUTANEOUS

## 2016-09-16 MED ORDER — DOXYCYCLINE HYCLATE 100 MG PO TABS: 100.0000 mg | ORAL_TABLET | Freq: Two times a day (BID) | ORAL | 0 refills | Status: DC

## 2016-09-16 MED ORDER — INSULIN ASPART 100 UNIT/ML ~~LOC~~ SOLN
5.0000 [IU] | Freq: Once | SUBCUTANEOUS | Status: AC
  Administered 2016-09-16: 5 [IU] via SUBCUTANEOUS

## 2016-09-16 MED FILL — DOXYCYCLINE 100 MG TABLET: 100 | 7 days supply | Qty: 14 | Fill #0

## 2016-09-16 NOTE — Progress Notes (Signed)
Subjective:  Patient ID: Sarah Charles, female    DOB: [birth year]  Age: 56 y.o. MRN: [id number]  CC: Diabetes   HPI Uriyah Massimo presents for   Insect bites: Self reported history of a tick bite three days ago. Patient removed tick with her fingertips and alcohol. Reports tick was small and brown in appearance. Tick was located on upper, left, lateral back. Reports areas of redness. Denies any constitutional symptoms.   DM: Reports adherence with diabetic medications.Takes CBG BID. CBG's in am range in the 140- low 200's. Denies any associated symptoms.  Depression: Reports adherence with medication for depression. Denies any SI/HI. Denies any worsening of depressive symptoms.   HTN: Reports adherence with anti-hypertensive medications. Does not have BP cuff at home. Denies any associated symptoms.   Outpatient Medications Prior to Visit  Medication Sig Dispense Refill  . atorvastatin (LIPITOR) 40 MG tablet Take 1 tablet (40 mg total) by mouth daily. 90 tablet 0  . buPROPion (WELLBUTRIN SR) 150 MG 12 hr tablet Take 1 tablet (150 mg total) by mouth 2 (two) times daily. 60 tablet 2  . gabapentin (NEURONTIN) 300 MG capsule Take 1 capsule (300 mg total) by mouth at bedtime. 30 capsule 2  . glucose blood test strip Use as instructed 100 each 12  . ibuprofen (ADVIL,MOTRIN) 600 MG tablet Take 1 tablet (600 mg total) by mouth every 6 (six) hours as needed. 30 tablet 0  . insulin glargine (LANTUS) 100 UNIT/ML injection Inject 10 Units into the skin daily as needed (for high blood sugars).     . insulin glargine (LANTUS) 100 UNIT/ML injection Inject 0.2 mLs (20 Units total) into the skin at bedtime. 10 mL 3  . lovastatin (MEVACOR) 20 MG tablet Take 20 mg by mouth daily.    . metFORMIN (GLUCOPHAGE) 1000 MG tablet Take 1 tablet (1,000 mg total) by mouth 2 (two) times daily with a meal. 180 tablet 0  . nystatin cream (MYCOSTATIN) Apply 1 application topically 2 (two) times daily. Apply to affected  areas. For 2 weeks. 30 g 0  . rosuvastatin (CRESTOR) 20 MG tablet Take 20 mg by mouth daily.    Marland Kitchen lisinopril (PRINIVIL,ZESTRIL) 20 MG tablet Take 1 tablet (20 mg total) by mouth daily. 90 tablet 0  . oseltamivir (TAMIFLU) 75 MG capsule Take 1 capsule (75 mg total) by mouth every 12 (twelve) hours. (Patient not taking: Reported on 09/16/2016) 10 capsule 0  . sitaGLIPtin-metformin (JANUMET) 50-1000 MG tablet Take 1 tablet by mouth 2 (two) times daily with a meal. 60 tablet 3   No facility-administered medications prior to visit.     ROS Review of Systems  Constitutional: Negative.   Respiratory: Negative.   Cardiovascular: Negative.   Gastrointestinal: Negative.   Skin: Positive for wound.  Neurological: Negative.   Psychiatric/Behavioral: Negative for suicidal ideas.    Objective:  BP (!) 146/78 (BP Location: Left Arm, Patient Position: Sitting, Cuff Size: Normal)   Pulse 71   Temp 97.4 F (36.3 C) (Oral)   Resp 18   Ht  (1.6 m)   Wt 176 lb 3.2 oz (79.9 kg)   SpO2 96%   BMI 31.21 kg/m   BP/Weight 09/16/2016 09/13/2016 08/11/2016  Systolic BP 146 167 152  Diastolic BP 78 90 87  Wt. (Lbs) 176.2 - 181.2  BMI 31.21 - 32.1   Physical Exam  Constitutional: She is oriented to person, place, and time. She appears well-developed and well-nourished.  Eyes: Conjunctivae are normal. Pupils  are equal, round, and reactive to light.  Neck: Neck supple.  Cardiovascular: Normal rate, regular rhythm, normal heart sounds and intact distal pulses.   Pulmonary/Chest: Effort normal and breath sounds normal.  Abdominal: Soft. Bowel sounds are normal.  Lymphadenopathy:    She has no cervical adenopathy.  Neurological: She is alert and oriented to person, place, and time.  Skin: Skin is warm and dry.     Psychiatric: She expresses no homicidal and no suicidal ideation. She expresses no suicidal plans and no homicidal plans.  Nursing note and vitals reviewed.    Assessment & Plan:    Problem List Items Addressed This Visit      Endocrine   Uncontrolled type 2 diabetes mellitus with complication (HCC)   Relevant Medications   insulin aspart (novoLOG) injection 10 Units (Completed)   insulin aspart (novoLOG) injection 5 Units (Completed)   Other Relevant Orders   Glucose (CBG) (Completed)   POCT UA - Microalbumin (Completed)   Ambulatory referral to Ophthalmology   Glucose (CBG) (Completed)   Glucose (CBG) (Completed)    Other Visit Diagnoses    Tick bite of back, initial encounter    -  Primary   Relevant Medications   doxycycline (VIBRA-TABS) 100 MG tablet   Cellulitis of skin of back       Relevant Medications Area marked with skin marker  Patient given return precautions.     doxycycline (VIBRA-TABS) 100 MG tablet   Health care maintenance       Relevant Medications   pneumococcal 23 valent vaccine (PNU-IMMUNE) injection 0.5 mL (Completed)   Other Relevant Orders   Hepatitis C Antibody (Completed)   HIV antibody (with reflex) (Completed)      Meds ordered this encounter  Medications  . insulin aspart (novoLOG) injection 10 Units  . pneumococcal 23 valent vaccine (PNU-IMMUNE) injection 0.5 mL  . doxycycline (VIBRA-TABS) 100 MG tablet    Sig: Take 1 tablet (100 mg total) by mouth 2 (two) times daily.    Dispense:  14 tablet    Refill:  0    Order Specific Question:   Supervising Provider    Answer:   Quentin Angst [id number]  . insulin aspart (novoLOG) injection 5 Units    Follow-up: Return in about 2 weeks (around 09/30/2016) for DM/HTN .   Lizbeth Bark FNP

## 2016-09-16 NOTE — Patient Instructions (Addendum)
You will be called with your labs results. Bring all medication you are taking to next office visit. Take blood sugars morning, afternoon, and before bedtime. Bring glucometer to next office visit.  Cellulitis, Adult Cellulitis is a skin infection. The infected area is usually red and sore. This condition occurs most often in the arms and lower legs. It is very important to get treated for this condition. Follow these instructions at home:  Take over-the-counter and prescription medicines only as told by your doctor.  If you were prescribed an antibiotic medicine, take it as told by your doctor. Do not stop taking the antibiotic even if you start to feel better.  Drink enough fluid to keep your pee (urine) clear or pale yellow.  Do not touch or rub the infected area.  Raise (elevate) the infected area above the level of your heart while you are sitting or lying down.  Place warm or cold wet cloths (warm or cold compresses) on the infected area. Do this as told by your doctor.  Keep all follow-up visits as told by your doctor. This is important. These visits let your doctor make sure your infection is not getting worse. Contact a doctor if:  You have a fever.  Your symptoms do not get better after 1-2 days of treatment.  Your bone or joint under the infected area starts to hurt after the skin has healed.  Your infection comes back. This can happen in the same area or another area.  You have a swollen bump in the infected area.  You have new symptoms.  You feel ill and also have muscle aches and pains. Get help right away if:  Your symptoms get worse.  You feel very sleepy.  You throw up (vomit) or have watery poop (diarrhea) for a long time.  There are red streaks coming from the infected area.  Your red area gets larger.  Your red area turns darker. This information is not intended to replace advice given to you by your health care provider. Make sure you discuss any  questions you have with your health care provider. Document Released: 10/27/2007 Document Revised: 10/16/2015 Document Reviewed: 03/19/2015 Elsevier Interactive Patient Education  2017 [employer].

## 2016-09-16 NOTE — Progress Notes (Signed)
Patient is here for Tick bites  Patient has not taking her current medication for today   Patient has not eaten for today   Patient denies pain for today visit

## 2016-09-17 LAB — HEPATITIS C ANTIBODY: Hep C Virus Ab: <0.1 s/co ratio (ref 0.0–0.9)

## 2016-09-17 LAB — HIV ANTIBODY (ROUTINE TESTING W REFLEX): HIV Screen 4th Generation wRfx: NONREACTIVE

## 2016-09-22 ENCOUNTER — Ambulatory Visit: Payer: Self-pay | Attending: Family Medicine | Admitting: Family Medicine

## 2016-09-22 ENCOUNTER — Telehealth: Payer: Self-pay

## 2016-09-22 VITALS — BP 145/80 | HR 73 | Temp 97.8°F | Resp 18 | Ht 62.0 in | Wt 175.8 lb

## 2016-09-22 DIAGNOSIS — W57XXXA Bitten or stung by nonvenomous insect and other nonvenomous arthropods, initial encounter: Secondary | ICD-10-CM | POA: Insufficient documentation

## 2016-09-22 DIAGNOSIS — S30860D Insect bite (nonvenomous) of lower back and pelvis, subsequent encounter: Secondary | ICD-10-CM

## 2016-09-22 DIAGNOSIS — F32A Depression, unspecified: Secondary | ICD-10-CM

## 2016-09-22 DIAGNOSIS — E118 Type 2 diabetes mellitus with unspecified complications: Secondary | ICD-10-CM

## 2016-09-22 DIAGNOSIS — I10 Essential (primary) hypertension: Secondary | ICD-10-CM | POA: Insufficient documentation

## 2016-09-22 DIAGNOSIS — W57XXXD Bitten or stung by nonvenomous insect and other nonvenomous arthropods, subsequent encounter: Secondary | ICD-10-CM

## 2016-09-22 DIAGNOSIS — E1165 Type 2 diabetes mellitus with hyperglycemia: Secondary | ICD-10-CM | POA: Insufficient documentation

## 2016-09-22 DIAGNOSIS — E1142 Type 2 diabetes mellitus with diabetic polyneuropathy: Secondary | ICD-10-CM | POA: Insufficient documentation

## 2016-09-22 DIAGNOSIS — F329 Major depressive disorder, single episode, unspecified: Secondary | ICD-10-CM | POA: Insufficient documentation

## 2016-09-22 DIAGNOSIS — Z794 Long term (current) use of insulin: Secondary | ICD-10-CM | POA: Insufficient documentation

## 2016-09-22 DIAGNOSIS — L03312 Cellulitis of back [any part except buttock]: Secondary | ICD-10-CM | POA: Insufficient documentation

## 2016-09-22 DIAGNOSIS — E782 Mixed hyperlipidemia: Secondary | ICD-10-CM | POA: Insufficient documentation

## 2016-09-22 DIAGNOSIS — K219 Gastro-esophageal reflux disease without esophagitis: Secondary | ICD-10-CM | POA: Insufficient documentation

## 2016-09-22 LAB — GLUCOSE, POCT (MANUAL RESULT ENTRY)
POC Glucose: 315 mg/dl — AB (ref 70–99)
POC Glucose: 317 mg/dl — AB (ref 70–99)

## 2016-09-22 MED ORDER — GABAPENTIN 300 MG PO CAPS: 300.0000 mg | ORAL_CAPSULE | Freq: Every day | ORAL | 2 refills | Status: DC

## 2016-09-22 MED ORDER — LISINOPRIL 20 MG PO TABS: 20.0000 mg | ORAL_TABLET | Freq: Every day | ORAL | 0 refills | Status: DC

## 2016-09-22 MED ORDER — INSULIN GLARGINE 100 UNIT/ML ~~LOC~~ SOLN: 25.0000 [IU] | Freq: Every day | SUBCUTANEOUS | 3 refills | Status: DC

## 2016-09-22 MED ORDER — ATORVASTATIN CALCIUM 40 MG PO TABS
40.0000 mg | ORAL_TABLET | Freq: Every day | ORAL | 0 refills | Status: DC
Start: 2016-09-22 — End: 2017-01-31

## 2016-09-22 MED ORDER — INSULIN ASPART 100 UNIT/ML ~~LOC~~ SOLN
10.0000 [IU] | Freq: Once | SUBCUTANEOUS | Status: AC
  Administered 2016-09-22: 10 [IU] via SUBCUTANEOUS

## 2016-09-22 MED ORDER — DOXYCYCLINE HYCLATE 100 MG PO TABS: 100.0000 mg | ORAL_TABLET | Freq: Two times a day (BID) | ORAL | 0 refills | Status: DC

## 2016-09-22 MED ORDER — BUPROPION HCL ER (SR) 150 MG PO TB12: 150.0000 mg | ORAL_TABLET | Freq: Two times a day (BID) | ORAL | 2 refills | Status: DC

## 2016-09-22 MED ORDER — OMEPRAZOLE 20 MG PO CPDR: 20.0000 mg | DELAYED_RELEASE_CAPSULE | Freq: Every day | ORAL | 1 refills | Status: DC

## 2016-09-22 MED FILL — !LANTUS SOLOSTAR 100UNITS/M: 100 | 12 days supply | Qty: 3 | Fill #0

## 2016-09-22 MED FILL — ?ATORVASTATIN 40MG TABLET: 40 | 30 days supply | Qty: 30 | Fill #0

## 2016-09-22 MED FILL — DOXYCYCLINE 100 MG TABLET: 100 | 7 days supply | Qty: 14 | Fill #0

## 2016-09-22 MED FILL — ?LISINOPRIL 20 MG TABLET: 20 | 30 days supply | Qty: 30 | Fill #0

## 2016-09-22 MED FILL — BUPROPION SR 150 MG TABLET: 150 | 30 days supply | Qty: 60 | Fill #0

## 2016-09-22 MED FILL — GABAPENTIN 300 MG CAPSULE: 300 | 30 days supply | Qty: 30 | Fill #0

## 2016-09-22 MED FILL — OMEPRAZOLE DR 20 MG CAPSULE: 20 | 30 days supply | Qty: 30 | Fill #0

## 2016-09-22 NOTE — Progress Notes (Signed)
Patient is here for f/up  Patient ha snort eaten for today  Patient has not taking her medication for today  Patient needs medication refill   Needs medication for heart burn

## 2016-09-22 NOTE — Progress Notes (Signed)
Subjective:  Patient ID: Sarah Charles, female    DOB: 01/16/1961  Age: 56 y.o. MRN: [id number]  CC: Diabetes   HPI Linell Meldrum presents for   Insect bites: Self reported history of a tick bite about 2 week ago. Patient removed tick with her fingertips and alcohol. Reports tick was small and brown in appearance. Tick was located on upper, left, lateral back. Reports areas of redness. Denies any constitutional symptoms. Reports completing antibiotic course.  DM: Reports non-adherence with diabetic medications. Reports not taking CBG's everyday. CBG's in am range in the  200's to 320's. Denies any associated symptoms.  Depression: Reports adherence with medication for depression. Denies any SI/HI. Denies any worsening of depressive symptoms.Stressors include finances and marital separation.  HTN: Reports non-adherence with anti-hypertensive medications. Does not have BP cuff at home. Denies any associated symptoms.    Outpatient Medications Prior to Visit  Medication Sig Dispense Refill  . sitaGLIPtin-metformin (JANUMET) 50-1000 MG tablet Take 1 tablet by mouth 2 (two) times daily with a meal. 60 tablet 3  . atorvastatin (LIPITOR) 40 MG tablet Take 1 tablet (40 mg total) by mouth daily. 90 tablet 0  . buPROPion (WELLBUTRIN SR) 150 MG 12 hr tablet Take 1 tablet (150 mg total) by mouth 2 (two) times daily. 60 tablet 2  . doxycycline (VIBRA-TABS) 100 MG tablet Take 1 tablet (100 mg total) by mouth 2 (two) times daily. 14 tablet 0  . gabapentin (NEURONTIN) 300 MG capsule Take 1 capsule (300 mg total) by mouth at bedtime. 30 capsule 2  . lisinopril (PRINIVIL,ZESTRIL) 20 MG tablet Take 1 tablet (20 mg total) by mouth daily. 90 tablet 0  . glucose blood test strip Use as instructed 100 each 12  . ibuprofen (ADVIL,MOTRIN) 600 MG tablet Take 1 tablet (600 mg total) by mouth every 6 (six) hours as needed. 30 tablet 0  . lovastatin (MEVACOR) 20 MG tablet Take 20 mg by mouth daily.    . metFORMIN  (GLUCOPHAGE) 1000 MG tablet Take 1 tablet (1,000 mg total) by mouth 2 (two) times daily with a meal. 180 tablet 0  . nystatin cream (MYCOSTATIN) Apply 1 application topically 2 (two) times daily. Apply to affected areas. For 2 weeks. 30 g 0  . oseltamivir (TAMIFLU) 75 MG capsule Take 1 capsule (75 mg total) by mouth every 12 (twelve) hours. (Patient not taking: Reported on 09/16/2016) 10 capsule 0  . rosuvastatin (CRESTOR) 20 MG tablet Take 20 mg by mouth daily.    . insulin glargine (LANTUS) 100 UNIT/ML injection Inject 10 Units into the skin daily as needed (for high blood sugars).     . insulin glargine (LANTUS) 100 UNIT/ML injection Inject 0.2 mLs (20 Units total) into the skin at bedtime. 10 mL 3   No facility-administered medications prior to visit.     ROS Review of Systems  Constitutional: Negative.   Eyes: Negative.   Respiratory: Negative.   Cardiovascular: Negative.   Gastrointestinal: Negative.   Skin: Positive for wound (recent history of tick bite).  Psychiatric/Behavioral: Positive for behavioral problems and dysphoric mood. Negative for suicidal ideas.    Objective:  BP (!) 145/80 (BP Location: Left Arm, Patient Position: Sitting, Cuff Size: Normal)   Pulse 73   Temp 97.8 F (36.6 C) (Oral)   Resp 18   Ht  (1.575 m)   Wt 175 lb 12.8 oz (79.7 kg)   SpO2 95%   BMI 32.15 kg/m   BP/Weight 09/22/2016 09/16/2016 09/13/2016  Systolic BP 145 146 167  Diastolic BP 80 78 90  Wt. (Lbs) 175.8 176.2 -  BMI 32.15 31.21 -     Physical Exam  HENT:  Head: Normocephalic.  Eyes: Conjunctivae are normal. Pupils are equal, round, and reactive to light.  Cardiovascular: Normal rate, regular rhythm, normal heart sounds and intact distal pulses.   Pulmonary/Chest: Effort normal and breath sounds normal.  Abdominal: Soft. Bowel sounds are normal.  Skin: Skin is warm and dry. There is erythema.     Psychiatric: Her affect is blunt. She is slowed. She expresses no homicidal  and no suicidal ideation. She expresses no suicidal plans and no homicidal plans. She is inattentive.  Nursing note and vitals reviewed.  Assessment & Plan:   Problem List Items Addressed This Visit      Cardiovascular and Mediastinum   Hypertension   Has not taken medication for BP prior to visit.    Relevant Medications   lisinopril (PRINIVIL,ZESTRIL) 20 MG tablet   atorvastatin (LIPITOR) 40 MG tablet     Digestive   GASTROESOPHAGEAL REFLUX, NO ESOPHAGITIS   Relevant Medications   omeprazole (PRILOSEC) 20 MG capsule   Other Relevant Orders   H. pylori breath test     Endocrine   Uncontrolled type 2 diabetes mellitus with complication (HCC) - Primary   History of willful medication non-adherence.  Risks of uncontrolled diabetes discussed with patient.  Encouraged patient to take medication as prescribed.      Follow up with PCP in 1 month.   Relevant Medications   insulin aspart (novoLOG) injection 10 Units (Completed)   lisinopril (PRINIVIL,ZESTRIL) 20 MG tablet   atorvastatin (LIPITOR) 40 MG tablet   insulin glargine (LANTUS) 100 UNIT/ML injection   Other Relevant Orders   Glucose (CBG) (Completed)   Glucose (CBG) (Completed)     Other   Depression   Refer to psychiatry services for further management.    Relevant Medications   buPROPion (WELLBUTRIN SR) 150 MG 12 hr tablet   Other Relevant Orders   Ambulatory referral to Psychiatry    Other Visit Diagnoses    Mixed hyperlipidemia       Relevant Medications   lisinopril (PRINIVIL,ZESTRIL) 20 MG tablet   atorvastatin (LIPITOR) 40 MG tablet   Diabetic polyneuropathy associated with type 2 diabetes mellitus (HCC)       History of willful medication non-adherence.   Risks of uncontrolled diabetes discussed with patient.   Encouraged patient to take medication as prescribed.    Relevant Medications   insulin aspart (novoLOG) injection 10 Units (Completed)   lisinopril (PRINIVIL,ZESTRIL) 20 MG tablet    atorvastatin (LIPITOR) 40 MG tablet   gabapentin (NEURONTIN) 300 MG capsule   buPROPion (WELLBUTRIN SR) 150 MG 12 hr tablet   insulin glargine (LANTUS) 100 UNIT/ML injection   Tick bite of back, subsequent encounter       Relevant Medications   doxycycline (VIBRA-TABS) 100 MG tablet   Cellulitis of skin of back       Cellulitis is improving. Recommend extending antibiotic course.    Relevant Medications   doxycycline (VIBRA-TABS) 100 MG tablet      Meds ordered this encounter  Medications  . insulin aspart (novoLOG) injection 10 Units  . lisinopril (PRINIVIL,ZESTRIL) 20 MG tablet    Sig: Take 1 tablet (20 mg total) by mouth daily.    Dispense:  90 tablet    Refill:  0    Please delete all previous lisinopril scripts  Order Specific Question:   Supervising Provider    Answer:   Quentin Angst [id number]  . atorvastatin (LIPITOR) 40 MG tablet    Sig: Take 1 tablet (40 mg total) by mouth daily.    Dispense:  90 tablet    Refill:  0    Order Specific Question:   Supervising Provider    Answer:   Quentin Angst [id number]  . gabapentin (NEURONTIN) 300 MG capsule    Sig: Take 1 capsule (300 mg total) by mouth at bedtime.    Dispense:  30 capsule    Refill:  2    Order Specific Question:   Supervising Provider    Answer:   Quentin Angst [id number]  . buPROPion (WELLBUTRIN SR) 150 MG 12 hr tablet    Sig: Take 1 tablet (150 mg total) by mouth 2 (two) times daily.    Dispense:  60 tablet    Refill:  2    Order Specific Question:   Supervising Provider    Answer:   Quentin Angst [id number]  . doxycycline (VIBRA-TABS) 100 MG tablet    Sig: Take 1 tablet (100 mg total) by mouth 2 (two) times daily.    Dispense:  14 tablet    Refill:  0    Order Specific Question:   Supervising Provider    Answer:   Quentin Angst [id number]  . insulin glargine (LANTUS) 100 UNIT/ML injection    Sig: Inject 0.25 mLs (25 Units total) into the skin at bedtime.     Dispense:  10 mL    Refill:  3    Order Specific Question:   Supervising Provider    Answer:   Quentin Angst [id number]  . omeprazole (PRILOSEC) 20 MG capsule    Sig: Take 1 capsule (20 mg total) by mouth daily.    Dispense:  60 capsule    Refill:  1    Order Specific Question:   Supervising Provider    Answer:   Quentin Angst [[id number]]    Follow-up: Return in about 1 month (around 10/23/2016) for Diabetes.   Lizbeth Bark FNP

## 2016-09-22 NOTE — Patient Instructions (Addendum)
You will be called with your labs results.  Type 2 Diabetes Mellitus, Self Care, Adult When you have type 2 diabetes (type 2 diabetes mellitus), you must keep your blood sugar (glucose) under control. You can do this with:  Nutrition.  Exercise.  Lifestyle changes.  Medicines or insulin, if needed.  Support from your doctors and others. How do I manage my blood sugar?  Check your blood sugar level every day, as often as told.  Call your doctor if your blood sugar is above your goal numbers for 2 tests in a row.  Have your A1c (hemoglobin A1c) level checked at least two times a year. Have it checked more often if your doctor tells you to. Your doctor will set treatment goals for you. Generally, you should have these blood sugar levels:  Before meals (preprandial): 80-130 mg/dL (4.4-7.2 mmol/L).  After meals (postprandial): lower than 180 mg/dL (10 mmol/L).  A1c level: less than 7%. What do I need to know about high blood sugar? High blood sugar is called hyperglycemia. Know the signs of high blood sugar. Signs may include:  Feeling:  Thirsty.  Hungry.  Very tired.  Needing to pee (urinate) more than usual.  Blurry vision. What do I need to know about low blood sugar? Low blood sugar is called hypoglycemia. This is when blood sugar is at or below 70 mg/dL (3.9 mmol/L). Symptoms may include:  Feeling:  Hungry.  Worried or nervous (anxious).  Sweaty and clammy.  Confused.  Dizzy.  Sleepy.  Sick to your stomach (nauseous).  Having:  A fast heartbeat (palpitations).  A headache.  A change in your vision.  Jerky movements that you cannot control (seizure).  Nightmares.  Tingling or no feeling (numbness) around the mouth, lips, or tongue.  Having trouble with:  Talking.  Paying attention (concentrating).  Moving (coordination).  Sleeping.  Shaking.  Passing out (fainting).  Getting upset easily (irritability). Treating low blood sugar     To treat low blood sugar, eat or drink something sugary right away. If you can think clearly and swallow safely, follow the 15:15 rule:  Take 15 grams of a fast-acting carb (carbohydrate). Some fast-acting carbs are:  1 tube of glucose gel.  3 sugar tablets (glucose pills).  6-8 pieces of hard candy.  4 oz (120 mL) of fruit juice.  4 oz (120 mL) regular (not diet) soda.  Check your blood sugar 15 minutes after you take the carb.  If your blood sugar is still at or below 70 mg/dL (3.9 mmol/L), take 15 grams of a carb again.  If your blood sugar does not go above 70 mg/dL (3.9 mmol/L) after 3 tries, get help right away.  After your blood sugar goes back to normal, eat a meal or a snack within 1 hour. Treating very low blood sugar  If your blood sugar is at or below 54 mg/dL (3 mmol/L), you have very low blood sugar (severe hypoglycemia). This is an emergency. Do not wait to see if the symptoms will go away. Get medical help right away. Call your local emergency services (911 in the [place] Do not drive yourself to the hospital. If you have very low blood sugar and you cannot eat or drink, you may need a glucagon shot (injection). A family member or friend should learn how to check your blood sugar and how to give you a glucagon shot. Ask your doctor if you need to have a glucagon shot kit at home. What else  is important to manage my diabetes? Medicine  Follow these instructions about insulin and diabetes medicines:  Take them as told by your doctor.  Adjust them as told by your doctor.  Do not run out of them. Having diabetes can raise your risk for other long-term conditions. These include heart or kidney disease. Your doctor may prescribe medicines to help prevent problems from diabetes. Food    Make healthy food choices. These include:  Chicken, fish, egg whites, and beans.  Oats, whole wheat, bulgur, brown rice, quinoa, and millet.  Fresh fruits and  vegetables.  Low-fat dairy products.  Nuts, avocado, olive oil, and canola oil.  Make a food plan with a specialist (dietitian).  Follow instructions from your doctor about what you cannot eat or drink.  Drink enough fluid to keep your pee (urine) clear or pale yellow.  Eat healthy snacks between healthy meals.  Keep track of carbs that you eat. Read food labels. Learn food serving sizes.  Follow your sick day plan when you cannot eat or drink normally. Make this plan with your doctor so it is ready to use. Activity   Exercise at least 3 times a week.  Do not go more than 2 days without exercising.  Talk with your doctor before you start a new exercise. Your doctor may need to adjust your insulin, medicines, or food. Lifestyle    Do not use any tobacco products. These include cigarettes, chewing tobacco, and e-cigarettes.If you need help quitting, ask your doctor.  Ask your doctor how much alcohol is safe for you.  Learn to deal with stress. If you need help with this, ask your doctor. Body care   Stay up to date with your shots (immunizations).  Have your eyes and feet checked by a doctor as often as told.  Check your skin and feet every day. Check for cuts, bruises, redness, blisters, or sores.  Brush your teeth and gums two times a day.  Floss at least one time a day.  Go to the dentist least one time every 6 months.  Stay at a healthy weight. General instructions    Take over-the-counter and prescription medicines only as told by your doctor.  Share your diabetes care plan with:  Your work or school.  People you live with.  Check your pee (urine) for ketones:  When you are sick.  As told by your doctor.  Carry a card or wear jewelry that says that you have diabetes.  Ask your doctor:  Do I need to meet with a diabetes educator?  Where can I find a support group for people with diabetes?  Keep all follow-up visits as told by your doctor. This  is important. Where to find more information: To learn more about diabetes, visit:  American Diabetes Association: www.diabetes.org  American Association of Diabetes Educators: www.diabeteseducator.org/patient-resources This information is not intended to replace advice given to you by your health care provider. Make sure you discuss any questions you have with your health care provider. Document Released: 09/01/2015 Document Revised: 10/16/2015 Document Reviewed: 06/13/2015 Elsevier Interactive Patient Education  2017 [employer].

## 2016-09-22 NOTE — Telephone Encounter (Signed)
Patient was in office on 09/22/2016 & was inform about her lab results  Patient was aware and understood

## 2016-09-22 NOTE — Telephone Encounter (Signed)
-----   Message from Lizbeth Bark, FNP sent at 09/21/2016  4:19 PM EDT ----- HIV is negative.  Hepatitis C negative

## 2016-09-24 LAB — H.PYLORI BREATH TEST (REFLEX): H. pylori Breath Test: NEGATIVE

## 2016-09-24 LAB — H. PYLORI BREATH TEST

## 2016-09-29 ENCOUNTER — Telehealth: Payer: Self-pay

## 2016-09-29 NOTE — Telephone Encounter (Signed)
-----   Message from Lizbeth BarkMandesia R Hairston, FNP sent at 09/29/2016 12:52 PM EDT ----- -H.pylori is negative. H.pylori is a bacteria that can infect the stomach and cause stomach ulcers.

## 2016-09-29 NOTE — Telephone Encounter (Signed)
PT called back update phone number, can you please call her back

## 2016-09-29 NOTE — Telephone Encounter (Signed)
CMA call regarding lab results   Patient did answer & no VM set up

## 2016-09-29 NOTE — Telephone Encounter (Signed)
CMA call second time to patient still no answer

## 2016-09-30 ENCOUNTER — Ambulatory Visit: Payer: Self-pay | Admitting: Family Medicine

## 2016-09-30 ENCOUNTER — Telehealth: Payer: Self-pay | Admitting: Family Medicine

## 2016-09-30 NOTE — Telephone Encounter (Signed)
CMA return Patient call  Patient was aware and understood results

## 2016-09-30 NOTE — Telephone Encounter (Signed)
Patient called the office returning the nurse's call.   Thank you.

## 2016-10-08 ENCOUNTER — Ambulatory Visit: Payer: Self-pay | Attending: Family Medicine

## 2016-10-11 ENCOUNTER — Other Ambulatory Visit: Payer: Self-pay | Admitting: Pharmacist

## 2016-10-11 DIAGNOSIS — I10 Essential (primary) hypertension: Secondary | ICD-10-CM

## 2016-10-11 MED ORDER — LISINOPRIL 20 MG PO TABS: 20.0000 mg | ORAL_TABLET | Freq: Every day | ORAL | 0 refills | Status: DC

## 2016-10-14 NOTE — Telephone Encounter (Signed)
Pt. Came to facility to drop off disability paperwork that needs to be filled out by her PCP. Pt. Was told that it would take 7 to 14 business days to be filled out. Paperwork will be put in PCP box. Please f/u

## 2016-10-14 NOTE — Telephone Encounter (Signed)
Pt. Came to facility to drop off disability paperwork that needs to be filled out by her PCP. Pt. Was told that it would take 7 to 14 business days to be filled out. Paperwork will be put in PCP box. Please f/u

## 2016-10-20 NOTE — Telephone Encounter (Signed)
PT called to find out if the paper that she left last week was ready, I informed her that it take 7 to 14 business day to be ready and it only been 4days , she claiming that she has an appt with the Social Security doctor today and she need the papers, ash ask to speak with the nurse and for her to call her back, please Follow up

## 2016-11-03 ENCOUNTER — Ambulatory Visit: Payer: Self-pay | Attending: Family Medicine | Admitting: Family Medicine

## 2016-11-03 VITALS — BP 115/73 | HR 82 | Temp 97.5°F | Resp 18 | Ht 63.0 in | Wt 169.6 lb

## 2016-11-03 DIAGNOSIS — W5501XS Bitten by cat, sequela: Secondary | ICD-10-CM

## 2016-11-03 DIAGNOSIS — Z9189 Other specified personal risk factors, not elsewhere classified: Secondary | ICD-10-CM

## 2016-11-03 DIAGNOSIS — W5501XA Bitten by cat, initial encounter: Secondary | ICD-10-CM | POA: Insufficient documentation

## 2016-11-03 DIAGNOSIS — F32A Depression, unspecified: Secondary | ICD-10-CM

## 2016-11-03 DIAGNOSIS — IMO0002 Reserved for concepts with insufficient information to code with codable children: Secondary | ICD-10-CM

## 2016-11-03 DIAGNOSIS — S51852S Open bite of left forearm, sequela: Secondary | ICD-10-CM

## 2016-11-03 DIAGNOSIS — Z8659 Personal history of other mental and behavioral disorders: Secondary | ICD-10-CM

## 2016-11-03 DIAGNOSIS — E1165 Type 2 diabetes mellitus with hyperglycemia: Secondary | ICD-10-CM | POA: Insufficient documentation

## 2016-11-03 DIAGNOSIS — Z79899 Other long term (current) drug therapy: Secondary | ICD-10-CM | POA: Insufficient documentation

## 2016-11-03 DIAGNOSIS — S51852A Open bite of left forearm, initial encounter: Secondary | ICD-10-CM | POA: Insufficient documentation

## 2016-11-03 DIAGNOSIS — Z794 Long term (current) use of insulin: Secondary | ICD-10-CM | POA: Insufficient documentation

## 2016-11-03 DIAGNOSIS — F329 Major depressive disorder, single episode, unspecified: Secondary | ICD-10-CM | POA: Insufficient documentation

## 2016-11-03 DIAGNOSIS — I10 Essential (primary) hypertension: Secondary | ICD-10-CM

## 2016-11-03 DIAGNOSIS — Z9114 Patient's other noncompliance with medication regimen: Secondary | ICD-10-CM | POA: Insufficient documentation

## 2016-11-03 DIAGNOSIS — Z91199 Patient's noncompliance with other medical treatment and regimen due to unspecified reason: Secondary | ICD-10-CM

## 2016-11-03 DIAGNOSIS — Z9119 Patient's noncompliance with other medical treatment and regimen: Secondary | ICD-10-CM

## 2016-11-03 DIAGNOSIS — E118 Type 2 diabetes mellitus with unspecified complications: Secondary | ICD-10-CM

## 2016-11-03 LAB — GLUCOSE, POCT (MANUAL RESULT ENTRY)
POC Glucose: 365 mg/dl — AB (ref 70–99)
POC Glucose: 288 mg/dl — AB (ref 70–99)

## 2016-11-03 LAB — POCT GLYCOSYLATED HEMOGLOBIN (HGB A1C): Hemoglobin A1C: 11.5

## 2016-11-03 MED ORDER — INSULIN ASPART 100 UNIT/ML ~~LOC~~ SOLN
20.0000 [IU] | Freq: Once | SUBCUTANEOUS | Status: AC
  Administered 2016-11-03: 20 [IU] via SUBCUTANEOUS

## 2016-11-03 MED ORDER — AMOXICILLIN-POT CLAVULANATE 875-125 MG PO TABS: 1.0000 | ORAL_TABLET | Freq: Two times a day (BID) | ORAL | 0 refills | Status: DC

## 2016-11-03 MED ORDER — BACITRACIN 500 UNIT/GM EX OINT: 1.0000 "application " | TOPICAL_OINTMENT | Freq: Two times a day (BID) | CUTANEOUS | 0 refills | Status: DC

## 2016-11-03 MED ORDER — BACITRACIN 500 UNIT/GM EX OINT: 1.0000 "application " | TOPICAL_OINTMENT | Freq: Once | CUTANEOUS | Status: DC

## 2016-11-03 MED FILL — AMOX-CLAV 875-125 MG TABLET: 875-125 | 10 days supply | Qty: 20 | Fill #0

## 2016-11-03 NOTE — Patient Instructions (Addendum)
Take your medications. Check blood sugars three times a day. Bring blood glucose meter to next office visit.  Animal Bite Animal bite wounds can get infected. It is important to get proper medical treatment. Ask your doctor if you need rabies treatment. Follow these instructions at home: Wound care  Follow instructions from your doctor about how to take care of your wound. Make sure you: ? Wash your hands with soap and water before you change your bandage (dressing). If you cannot use soap and water, use hand sanitizer. ? Change your bandage as told by your doctor. ? Leave stitches (sutures), skin glue, or skin tape (adhesive) strips in place. They may need to stay in place for 2 weeks or longer. If tape strips get loose and curl up, you may trim the loose edges. Do not remove tape strips completely unless your doctor says it is okay.  Check your wound every day for signs of infection. Watch for: ? Redness, swelling, or pain that gets worse. ? Fluid, blood, or pus. General instructions  Take or apply over-the-counter and prescription medicines only as told by your doctor.  If you were prescribed an antibiotic, take or apply it as told by your doctor. Do not stop using the antibiotic even if your condition improves.  Keep the injured area raised (elevated) above the level of your heart while you are sitting or lying down.  If directed, apply ice to the injured area. ? Put ice in a plastic bag. ? Place a towel between your skin and the bag. ? Leave the ice on for 20 minutes, 2-3 times per day.  Keep all follow-up visits as told by your doctor. This is important. Contact a doctor if:  You have redness, swelling, or pain that gets worse.  You have a general feeling of sickness (malaise).  You feel sick to your stomach (nauseous).  You throw up (vomit).  You have pain that does not get better. Get help right away if:  You have a red streak going away from your wound.  You  have fluid, blood, or pus coming from your wound.  You have a fever or chills.  You have trouble moving your injured area.  You have numbness or tingling anywhere on your body. This information is not intended to replace advice given to you by your health care provider. Make sure you discuss any questions you have with your health care provider. Document Released: 05/10/2005 Document Revised: 10/16/2015 Document Reviewed: 09/25/2014 Elsevier Interactive Patient Education  2018 [employer]   Wound Care, Adult Taking care of your wound properly can help to prevent pain and infection. It can also help your wound to heal more quickly. How is this treated? Wound care  Follow instructions from your health care provider about how to take care of your wound. Make sure you: ? Wash your hands with soap and water before you change the bandage (dressing). If soap and water are not available, use hand sanitizer. ? Change your dressing as told by your health care provider. ? Leave stitches (sutures), skin glue, or adhesive strips in place. These skin closures may need to stay in place for 2 weeks or longer. If adhesive strip edges start to loosen and curl up, you may trim the loose edges. Do not remove adhesive strips completely unless your health care provider tells you to do that.  Check your wound area every day for signs of infection. Check for: ? More redness, swelling, or pain. ?  More fluid or blood. ? Warmth. ? Pus or a bad smell.  Ask your health care provider if you should clean the wound with mild soap and water. Doing this may include: ? Using a clean towel to pat the wound dry after cleaning it. Do not rub or scrub the wound. ? Applying a cream or ointment. Do this only as told by your health care provider. ? Covering the incision with a clean dressing.  Ask your health care provider when you can leave the wound uncovered. Medicines   If you were prescribed an antibiotic  medicine, cream, or ointment, take or use the antibiotic as told by your health care provider. Do not stop taking or using the antibiotic even if your condition improves.  Take over-the-counter and prescription medicines only as told by your health care provider. If you were prescribed pain medicine, take it at least 30 minutes before doing any wound care or as told by your health care provider. General instructions  Return to your normal activities as told by your health care provider. Ask your health care provider what activities are safe.  Do not scratch or pick at the wound.  Keep all follow-up visits as told by your health care provider. This is important.  Eat a diet that includes protein, vitamin A, vitamin C, and other nutrient-rich foods. These help the wound heal: ? Protein-rich foods include meat, dairy, beans, nuts, and other sources. ? Vitamin A-rich foods include carrots and dark green, leafy vegetables. ? Vitamin C-rich foods include citrus, tomatoes, and other fruits and vegetables. ? Nutrient-rich foods have protein, carbohydrates, fat, vitamins, or minerals. Eat a variety of healthy foods including vegetables, fruits, and whole grains. Contact a health care provider if:  You received a tetanus shot and you have swelling, severe pain, redness, or bleeding at the injection site.  Your pain is not controlled with medicine.  You have more redness, swelling, or pain around the wound.  You have more fluid or blood coming from the wound.  Your wound feels warm to the touch.  You have pus or a bad smell coming from the wound.  You have a fever or chills.  You are nauseous or you vomit.  You are dizzy. Get help right away if:  You have a red streak going away from your wound.  The edges of the wound open up and separate.  Your wound is bleeding and the bleeding does not stop with gentle pressure.  You have a rash.  You faint.  You have trouble breathing. This  information is not intended to replace advice given to you by your health care provider. Make sure you discuss any questions you have with your health care provider. Document Released: 02/17/2008 Document Revised: 01/07/2016 Document Reviewed: 11/25/2015 Elsevier Interactive Patient Education  2017 [employer].

## 2016-11-03 NOTE — Progress Notes (Signed)
Subjective:  Patient ID: Sarah Charles, female    DOB: 03/07/1961  Age: 56 y.o. MRN: [id number]  CC: Diabetes   HPI Sarah Charles presents  for follow up of diabetes. Symptoms: none. Patient denies foot ulcerations, nausea, paresthesia of the feet, visual disturbances and vomitting.  Evaluation to date has been included: fasting blood sugar, fasting lipid panel, hemoglobin A1C and microalbuminuria.  Home sugars: patient does not check sugars. Treatment to date: Insulin, metformin, and  DDP 4 inhibitor. She is frequently non-compliant with medication regimen. Reports last time she check CBG was last week. She does not bring glucometer or blood glucose to office visit. She does report a 2 month history of cat bite to left lower arm. She denies any swelling, redness or pain of the extremity.   History of depression: She exhibits depressed mood. Onset was approximately several years ago, unchanged since that time.  She denies current suicidal and homicidal plan or intent.   Family history significant for no psychiatric illness.Possible organic causes contributing are: none.  Risk factors: negative life event separation from husband and previous episode of depression Previous treatment includes Wellbutrin and individual counseling.. She denies any side effects from the treatment. She is non-complaints with taking her medications regularly.   Outpatient Medications Prior to Visit  Medication Sig Dispense Refill  . atorvastatin (LIPITOR) 40 MG tablet Take 1 tablet (40 mg total) by mouth daily. 90 tablet 0  . buPROPion (WELLBUTRIN SR) 150 MG 12 hr tablet Take 1 tablet (150 mg total) by mouth 2 (two) times daily. 60 tablet 2  . gabapentin (NEURONTIN) 300 MG capsule Take 1 capsule (300 mg total) by mouth at bedtime. 30 capsule 2  . glucose blood test strip Use as instructed 100 each 12  . ibuprofen (ADVIL,MOTRIN) 600 MG tablet Take 1 tablet (600 mg total) by mouth every 6 (six) hours as needed. 30 tablet 0  .  insulin glargine (LANTUS) 100 UNIT/ML injection Inject 0.25 mLs (25 Units total) into the skin at bedtime. 10 mL 3  . lisinopril (PRINIVIL,ZESTRIL) 20 MG tablet Take 1 tablet (20 mg total) by mouth daily. 90 tablet 0  . lovastatin (MEVACOR) 20 MG tablet Take 20 mg by mouth daily.    . metFORMIN (GLUCOPHAGE) 1000 MG tablet Take 1 tablet (1,000 mg total) by mouth 2 (two) times daily with a meal. 180 tablet 0  . nystatin cream (MYCOSTATIN) Apply 1 application topically 2 (two) times daily. Apply to affected areas. For 2 weeks. 30 g 0  . omeprazole (PRILOSEC) 20 MG capsule Take 1 capsule (20 mg total) by mouth daily. 60 capsule 1  . rosuvastatin (CRESTOR) 20 MG tablet Take 20 mg by mouth daily.    . sitaGLIPtin-metformin (JANUMET) 50-1000 MG tablet Take 1 tablet by mouth 2 (two) times daily with a meal. 60 tablet 3  . doxycycline (VIBRA-TABS) 100 MG tablet Take 1 tablet (100 mg total) by mouth 2 (two) times daily. 14 tablet 0  . oseltamivir (TAMIFLU) 75 MG capsule Take 1 capsule (75 mg total) by mouth every 12 (twelve) hours. (Patient not taking: Reported on 09/16/2016) 10 capsule 0   No facility-administered medications prior to visit.     ROS Review of Systems  Constitutional: Negative.   Eyes: Negative.   Respiratory: Negative.   Cardiovascular: Negative.   Gastrointestinal: Negative.   Musculoskeletal: Negative.   Skin: Positive for wound (left lower arm).  Psychiatric/Behavioral: Positive for dysphoric mood.   Objective:  BP 115/73 (BP Location:  Left Arm, Patient Position: Sitting, Cuff Size: Normal)   Pulse 82   Temp 97.5 F (36.4 C) (Oral)   Resp 18   Ht 5\' 3"  (1.6 m)   Wt 169 lb 9.6 oz (76.9 kg)   SpO2 97%   BMI 30.04 kg/m   BP/Weight 11/03/2016 09/22/2016 09/16/2016  Systolic BP 115 145 146  Diastolic BP 73 80 78  Wt. (Lbs) 169.6 175.8 176.2  BMI 30.04 32.15 31.21   Physical Exam  Constitutional: She is oriented to person, place, and time. She appears well-developed and  well-nourished.  Eyes: Conjunctivae are normal. Pupils are equal, round, and reactive to light.  Neck: No JVD present.  Cardiovascular: Normal rate, regular rhythm, normal heart sounds and intact distal pulses.   Pulmonary/Chest: Effort normal and breath sounds normal.  Abdominal: Soft. Bowel sounds are normal. There is no tenderness.  Neurological: She is alert and oriented to person, place, and time.  Skin: Skin is warm and dry.  Circular wound about 1.5 cm in diameter with bloody drainage. No signs of swelling or redness extending beyond the site.  Psychiatric: Her speech is delayed. She is slowed. She exhibits a depressed mood. She expresses no homicidal and no suicidal ideation. She expresses no suicidal plans and no homicidal plans.  Nursing note and vitals reviewed.  Assessment & Plan:   Problem List Items Addressed This Visit      Cardiovascular and Mediastinum   Hypertension     Endocrine   Uncontrolled type 2 diabetes mellitus with complication (HCC) - Primary   Diabetes poorly controlled due to patient chronic history of non-compliance. Suspect her    history of depression is also contributing to this.   Relevant Medications   insulin aspart (novoLOG) injection 20 Units (Completed)   Other Relevant Orders    Glucose (CBG) (Completed)   HgB A1c (Completed)   Urinalysis Dipstick   Glucose (CBG) (Completed)     Other   Depression  Relevant Orders  Ambulatory referral to Psychiatry    Other Visit Diagnoses    Cat bite of forearm, left, sequela       Wound cleaned with NS, antibiotic ointment applied with bandage.   Course of antibioitc therapy and ointment prescribed   Follow up in 2 week to evaluate.   Relevant Medications   amoxicillin-clavulanate (AUGMENTIN) 875-125 MG tablet   bacitracin ointment 1 application   bacitracin 500 UNIT/GM ointment   History of depression       Relevant Orders   Ambulatory referral to Psychiatry   Nonadherence to medical  treatment       Relevant Orders   Ambulatory referral to Psychiatry   At risk for self care deficit       Relevant Orders   Ambulatory referral to Psychiatry      Meds ordered this encounter  Medications  . insulin aspart (novoLOG) injection 20 Units  . amoxicillin-clavulanate (AUGMENTIN) 875-125 MG tablet    Sig: Take 1 tablet by mouth 2 (two) times daily.    Dispense:  20 tablet    Refill:  0    Order Specific Question:   Supervising Provider    Answer:   Quentin AngstJEGEDE, OLUGBEMIGA E [id number]]  . bacitracin ointment 1 application  . bacitracin 500 UNIT/GM ointment    Sig: Apply 1 application topically 2 (two) times daily. To left forearm wound for 14 days.    Dispense:  28 g    Refill:  0    Order Specific Question:  Supervising Provider    Answer:   Quentin Angst [[id number]]    Follow-up: Return in about 2 weeks (around 11/17/2016) for Wound check/ DM.   Lizbeth Bark FNP

## 2016-11-03 NOTE — Progress Notes (Signed)
Patient is here for f/up  

## 2016-11-17 ENCOUNTER — Ambulatory Visit: Payer: Self-pay | Attending: Family Medicine | Admitting: Family Medicine

## 2016-11-17 VITALS — BP 156/81 | HR 75 | Temp 97.7°F | Resp 18 | Ht 63.0 in | Wt 171.6 lb

## 2016-11-17 DIAGNOSIS — Z794 Long term (current) use of insulin: Secondary | ICD-10-CM | POA: Insufficient documentation

## 2016-11-17 DIAGNOSIS — Z91199 Patient's noncompliance with other medical treatment and regimen due to unspecified reason: Secondary | ICD-10-CM

## 2016-11-17 DIAGNOSIS — IMO0002 Reserved for concepts with insufficient information to code with codable children: Secondary | ICD-10-CM

## 2016-11-17 DIAGNOSIS — Z9119 Patient's noncompliance with other medical treatment and regimen: Secondary | ICD-10-CM | POA: Insufficient documentation

## 2016-11-17 DIAGNOSIS — E1165 Type 2 diabetes mellitus with hyperglycemia: Secondary | ICD-10-CM | POA: Insufficient documentation

## 2016-11-17 DIAGNOSIS — I10 Essential (primary) hypertension: Secondary | ICD-10-CM | POA: Insufficient documentation

## 2016-11-17 DIAGNOSIS — E118 Type 2 diabetes mellitus with unspecified complications: Secondary | ICD-10-CM

## 2016-11-17 LAB — GLUCOSE, POCT (MANUAL RESULT ENTRY)
POC Glucose: 284 mg/dl — AB (ref 70–99)
POC Glucose: 331 mg/dl — AB (ref 70–99)

## 2016-11-17 MED ORDER — INSULIN ASPART 100 UNIT/ML ~~LOC~~ SOLN
10.0000 [IU] | Freq: Once | SUBCUTANEOUS | Status: AC
  Administered 2016-11-17: 10 [IU] via SUBCUTANEOUS

## 2016-11-17 MED ORDER — LISINOPRIL 20 MG PO TABS: 20.0000 mg | ORAL_TABLET | Freq: Every day | ORAL | 0 refills | Status: DC

## 2016-11-17 MED ORDER — METFORMIN HCL 1000 MG PO TABS: 1000.0000 mg | ORAL_TABLET | Freq: Two times a day (BID) | ORAL | 0 refills | Status: DC

## 2016-11-17 MED ORDER — INSULIN GLARGINE 100 UNIT/ML ~~LOC~~ SOLN: 25.0000 [IU] | Freq: Every day | SUBCUTANEOUS | 3 refills | Status: DC

## 2016-11-17 NOTE — Progress Notes (Signed)
Patient is here for f/up  

## 2016-11-17 NOTE — Progress Notes (Signed)
Subjective:  Patient ID: Sarah Charles, female    DOB: 09/26/1960  Age: 56 y.o. MRN: [id number]  CC: Diabetes   HPI Sarah AmmonsLaura Charles presents for Sarah Charles follow up of diabetes. Symptoms: none. Patient denies foot ulcerations, nausea, paresthesia of the feet, visual disturbances and vomitting.  Evaluation to date has been included: fasting blood sugar, fasting lipid panel, hemoglobin A1C and microalbuminuria.  Home sugars: patient does not check sugars. Treatment to date: Insulin, metformin, and  DDP 4 inhibitor. She is still  non-compliant with medication regimen. She does not bring her glucometer or list of blood glucose with her in office.  She reports cat bite to left lower arm has healed. She denies any pain, swelling, redness or pain of the extremity. She denies follow up with psych.referrals. She denies current suicidal and homicidal plan or intent. History of cat bite to the left forearm. She report adherence with antibiotics. Denies any symptoms.   Outpatient Medications Prior to Visit  Medication Sig Dispense Refill  . amoxicillin-clavulanate (AUGMENTIN) 875-125 MG tablet Take 1 tablet by mouth 2 (two) times daily. 20 tablet 0  . atorvastatin (LIPITOR) 40 MG tablet Take 1 tablet (40 mg total) by mouth daily. 90 tablet 0  . bacitracin 500 UNIT/GM ointment Apply 1 application topically 2 (two) times daily. To left forearm wound for 14 days. 28 g 0  . buPROPion (WELLBUTRIN SR) 150 MG 12 hr tablet Take 1 tablet (150 mg total) by mouth 2 (two) times daily. 60 tablet 2  . gabapentin (NEURONTIN) 300 MG capsule Take 1 capsule (300 mg total) by mouth at bedtime. 30 capsule 2  . glucose blood test strip Use as instructed 100 each 12  . ibuprofen (ADVIL,MOTRIN) 600 MG tablet Take 1 tablet (600 mg total) by mouth every 6 (six) hours as needed. 30 tablet 0  . nystatin cream (MYCOSTATIN) Apply 1 application topically 2 (two) times daily. Apply to affected areas. For 2 weeks. 30 g 0  . omeprazole (PRILOSEC) 20  MG capsule Take 1 capsule (20 mg total) by mouth daily. 60 capsule 1  . insulin glargine (LANTUS) 100 UNIT/ML injection Inject 0.25 mLs (25 Units total) into the skin at bedtime. 10 mL 3  . lisinopril (PRINIVIL,ZESTRIL) 20 MG tablet Take 1 tablet (20 mg total) by mouth daily. 90 tablet 0  . lovastatin (MEVACOR) 20 MG tablet Take 20 mg by mouth daily.    . metFORMIN (GLUCOPHAGE) 1000 MG tablet Take 1 tablet (1,000 mg total) by mouth 2 (two) times daily with a meal. 180 tablet 0  . rosuvastatin (CRESTOR) 20 MG tablet Take 20 mg by mouth daily.    . sitaGLIPtin-metformin (JANUMET) 50-1000 MG tablet Take 1 tablet by mouth 2 (two) times daily with a meal. 60 tablet 3   Facility-Administered Medications Prior to Visit  Medication Dose Route Frequency Provider Last Rate Last Dose  . bacitracin ointment 1 application  1 application Topical Once Ziquan Fidel R, FNP        ROS Review of Systems  Constitutional: Negative.   Eyes: Negative.   Respiratory: Negative.   Cardiovascular: Negative.   Endocrine: Negative.   Genitourinary: Negative.   Skin: Negative.   Neurological: Negative.   Psychiatric/Behavioral: Negative for suicidal ideas.       History of depression   Objective:  BP (!) 156/81 (BP Location: Left Arm, Patient Position: Sitting, Cuff Size: Normal)   Pulse 75   Temp 97.7 F (36.5 C) (Oral)   Resp 18  Ht 5\' 3"  (1.6 m)   Wt 171 lb 9.6 oz (77.8 kg)   SpO2 97%   BMI 30.40 kg/m   BP/Weight 11/17/2016 11/03/2016 09/22/2016  Systolic BP 156 115 145  Diastolic BP 81 73 80  Wt. (Lbs) 171.6 169.6 175.8  BMI 30.4 30.04 32.15   Physical Exam  Constitutional: She appears well-developed and well-nourished.  HENT:  Head: Normocephalic and atraumatic.  Right Ear: External ear normal.  Left Ear: External ear normal.  Nose: Nose normal.  Mouth/Throat: Oropharynx is clear and moist.  Eyes: Conjunctivae are normal. Pupils are equal, round, and reactive to light.  Cardiovascular:  Normal rate, regular rhythm, normal heart sounds and intact distal pulses.   Pulmonary/Chest: Effort normal and breath sounds normal.  Abdominal: Soft. Bowel sounds are normal.  Skin: Skin is warm and dry.  Skin to bilateral feet are wnl, no wounds present.  Psychiatric: Her speech is delayed. She is slowed. She expresses no homicidal and no suicidal ideation. She expresses no suicidal plans and no homicidal plans.  Flat affect   Nursing note and vitals reviewed.  Assessment & Plan:   Problem List Items Addressed This Visit      Cardiovascular and Mediastinum   Hypertension   Relevant Medications   lisinopril (PRINIVIL,ZESTRIL) 20 MG tablet     Endocrine   Uncontrolled type 2 diabetes mellitus with complication (HCC) - Primary   Follow up with clinical pharmacist in 2 weeks and bring blood glucose log/glucometer.   Follow up with PCP in 3 months.   Relevant Medications   insulin aspart (novoLOG) injection 10 Units (Completed)   lisinopril (PRINIVIL,ZESTRIL) 20 MG tablet   metFORMIN (GLUCOPHAGE) 1000 MG tablet   insulin glargine (LANTUS) 100 UNIT/ML injection   Other Relevant Orders   Glucose (CBG) (Completed)   Glucose (CBG) (Completed)    Other Visit Diagnoses    Nonadherence to medical treatment      Patient is consisently non-compliant with medication and medical regimen.   Did not bring in glucometer or blood glucose log to evaluate blood sugars at home.  Consistently non-compliant with referrals.       Meds ordered this encounter  Medications  . insulin aspart (novoLOG) injection 10 Units  . lisinopril (PRINIVIL,ZESTRIL) 20 MG tablet    Sig: Take 1 tablet (20 mg total) by mouth daily.    Dispense:  90 tablet    Refill:  0    Order Specific Question:   Supervising Provider    Answer:   Quentin Angst [id number]  . metFORMIN (GLUCOPHAGE) 1000 MG tablet    Sig: Take 1 tablet (1,000 mg total) by mouth 2 (two) times daily with a meal.    Dispense:  180 tablet      Refill:  0    Order Specific Question:   Supervising Provider    Answer:   Quentin Angst [id number]  . insulin glargine (LANTUS) 100 UNIT/ML injection    Sig: Inject 0.25 mLs (25 Units total) into the skin at bedtime.    Dispense:  10 mL    Refill:  3    Order Specific Question:   Supervising Provider    Answer:   Quentin Angst [[id number]]    Follow-up: Return in about 2 weeks (around 12/01/2016) for DM with clinical pharmacist.  Lizbeth Bark FNP

## 2016-11-17 NOTE — Patient Instructions (Addendum)
Diabetes Mellitus and Food It is important for you to manage your blood sugar (glucose) level. Your blood glucose level can be greatly affected by what you eat. Eating healthier foods in the appropriate amounts throughout the day at about the same time each day will help you control your blood glucose level. It can also help slow or prevent worsening of your diabetes mellitus. Healthy eating may even help you improve the level of your blood pressure and reach or maintain a healthy weight. General recommendations for healthful eating and cooking habits include:  Eating meals and snacks regularly. Avoid going long periods of time without eating to lose weight.  Eating a diet that consists mainly of plant-based foods, such as fruits, vegetables, nuts, legumes, and whole grains.  Using low-heat cooking methods, such as baking, instead of high-heat cooking methods, such as deep frying.  Work with your dietitian to make sure you understand how to use the Nutrition Facts information on food labels. How can food affect me? Carbohydrates Carbohydrates affect your blood glucose level more than any other type of food. Your dietitian will help you determine how many carbohydrates to eat at each meal and teach you how to count carbohydrates. Counting carbohydrates is important to keep your blood glucose at a healthy level, especially if you are using insulin or taking certain medicines for diabetes mellitus. Alcohol Alcohol can cause sudden decreases in blood glucose (hypoglycemia), especially if you use insulin or take certain medicines for diabetes mellitus. Hypoglycemia can be a life-threatening condition. Symptoms of hypoglycemia (sleepiness, dizziness, and disorientation) are similar to symptoms of having too much alcohol. If your health care provider has given you approval to drink alcohol, do so in moderation and use the following guidelines:  Women should not have more than one drink per day, and men  should not have more than two drinks per day. One drink is equal to: ? 12 oz of beer. ? 5 oz of wine. ? 1 oz of hard liquor.  Do not drink on an empty stomach.  Keep yourself hydrated. Have water, diet soda, or unsweetened iced tea.  Regular soda, juice, and other mixers might contain a lot of carbohydrates and should be counted.  What foods are not recommended? As you make food choices, it is important to remember that all foods are not the same. Some foods have fewer nutrients per serving than other foods, even though they might have the same number of calories or carbohydrates. It is difficult to get your body what it needs when you eat foods with fewer nutrients. Examples of foods that you should avoid that are high in calories and carbohydrates but low in nutrients include:  Trans fats (most processed foods list trans fats on the Nutrition Facts label).  Regular soda.  Juice.  Candy.  Sweets, such as cake, pie, doughnuts, and cookies.  Fried foods.  What foods can I eat? Eat nutrient-rich foods, which will nourish your body and keep you healthy. The food you should eat also will depend on several factors, including:  The calories you need.  The medicines you take.  Your weight.  Your blood glucose level.  Your blood pressure level.  Your cholesterol level.  You should eat a variety of foods, including:  Protein. ? Lean cuts of meat. ? Proteins low in saturated fats, such as fish, egg whites, and beans. Avoid processed meats.  Fruits and vegetables. ? Fruits and vegetables that may help control blood glucose levels, such as apples,   mangoes, and yams.  Dairy products. ? Choose fat-free or low-fat dairy products, such as milk, yogurt, and cheese.  Grains, bread, pasta, and rice. ? Choose whole grain products, such as multigrain bread, whole oats, and brown rice. These foods may help control blood pressure.  Fats. ? Foods containing healthful fats, such as  nuts, avocado, olive oil, canola oil, and fish.  Does everyone with diabetes mellitus have the same meal plan? Because every person with diabetes mellitus is different, there is not one meal plan that works for everyone. It is very important that you meet with a dietitian who will help you create a meal plan that is just right for you. This information is not intended to replace advice given to you by your health care provider. Make sure you discuss any questions you have with your health care provider. Document Released: 02/04/2005 Document Revised: 10/16/2015 Document Reviewed: 04/06/2013 Elsevier Interactive Patient Education  2017 [employer]  

## 2016-11-29 ENCOUNTER — Other Ambulatory Visit: Payer: Self-pay | Admitting: *Deleted

## 2016-11-29 MED ORDER — INSULIN GLARGINE 100 UNIT/ML ~~LOC~~ SOLN: 25.0000 [IU] | Freq: Every day | SUBCUTANEOUS | 3 refills | Status: DC

## 2016-11-29 MED ORDER — SITAGLIPTIN PHOS-METFORMIN HCL 50-1000 MG PO TABS: 1.0000 | ORAL_TABLET | Freq: Two times a day (BID) | ORAL | 3 refills | Status: DC

## 2016-11-29 NOTE — Telephone Encounter (Signed)
PRINTED FOR PASS PROGRAM 

## 2016-12-01 ENCOUNTER — Ambulatory Visit: Payer: Self-pay | Attending: Family Medicine | Admitting: Pharmacist

## 2016-12-01 DIAGNOSIS — E118 Type 2 diabetes mellitus with unspecified complications: Secondary | ICD-10-CM

## 2016-12-01 DIAGNOSIS — E1165 Type 2 diabetes mellitus with hyperglycemia: Secondary | ICD-10-CM | POA: Insufficient documentation

## 2016-12-01 DIAGNOSIS — Z9119 Patient's noncompliance with other medical treatment and regimen: Secondary | ICD-10-CM

## 2016-12-01 DIAGNOSIS — IMO0002 Reserved for concepts with insufficient information to code with codable children: Secondary | ICD-10-CM

## 2016-12-01 DIAGNOSIS — Z91199 Patient's noncompliance with other medical treatment and regimen due to unspecified reason: Secondary | ICD-10-CM

## 2016-12-01 DIAGNOSIS — Z794 Long term (current) use of insulin: Secondary | ICD-10-CM

## 2016-12-01 LAB — GLUCOSE, POCT (MANUAL RESULT ENTRY): POC Glucose: 315 mg/dl — AB (ref 70–99)

## 2016-12-01 MED ORDER — GLUCOSE BLOOD VI STRP: ORAL_STRIP | 12 refills | Status: DC

## 2016-12-01 MED ORDER — TRUE METRIX METER W/DEVICE KIT: PACK | 0 refills | Status: DC

## 2016-12-01 MED ORDER — TRUEPLUS LANCETS 28G MISC: 12 refills | Status: DC

## 2016-12-01 MED FILL — TRUE METRIX TEST STRIP: 25 days supply | Qty: 100 | Fill #0

## 2016-12-01 MED FILL — TRUEplus LANCETS 28G MISC: 25 days supply | Qty: 100 | Fill #0

## 2016-12-01 MED FILL — TRUE METRIX BLOOD GLUCOSE M: W/DEVICE | 365 days supply | Qty: 1 | Fill #0

## 2016-12-01 NOTE — Progress Notes (Signed)
    S:     Chief Complaint  Patient presents with  . Medication Management    Patient arrives in good spirits.  Presents for diabetes evaluation, education, and management [facility] Jegede. Patient was referred on 11/17/16.  Patient was last seen by Primary Care Provider on 11/17/16.   Patient denies adherence with medications. She reports missing her Lantus 2-3 times a week but denies missing the Janumet except for today Current diabetes medications include: Lantus 25 units daily, sitagliptin-metformin 50-1000 mg BID.   Patient denies hypoglycemic events.  Patient reported dietary habits: poor financial situation so has to eat what she has access to.   Patient denies nocturia.  Patient reports neuropathy. Patient denies visual changes. Patient reports self foot exams.   Patient reports that she doesn't have a blood glucose meter that works.  O:  Physical Exam   ROS   Lab Results  Component Value Date   HGBA1C 11.5 11/03/2016   There were no vitals filed for this visit.  Home fasting CBG: none 2 hour post-prandial/random CBG: none  POCT glucose = 315   A/P: Diabetes longstanding currently UNcontrolled based on A1c of 11.5. Patient denies hypoglycemic events and is able to verbalize appropriate hypoglycemia management plan. Patient denies adherence with medication. Control is suboptimal due to medication nonadherence.  Continue Lantus and Janumet as prescribed. Patient to go take Janumet dose she missed this morning now. Declines insulin injection in clinic. Stressed the importance of taking her medications every single day to avoid or delay the onset of the complications of diabetes. Ordered new blood glucose monitor and supplies and instructed her to check it every day and then come back in 2 weeks with the numbers.   Next A1C anticipated September 2018.    Written patient instructions provided.  Total time in face to face counseling 15  minutes.   Follow up in Pharmacist Clinic Visit in 2 weeks.

## 2016-12-01 NOTE — Patient Instructions (Addendum)
Thanks for coming to see me  Pick up the meter and supplies and start checking your blood sugar  Take your medication every day - it is really important for your health. Getting your blood sugar down can help prevent complications such as heart disease, stroke, kidney failure, nerve damage, and vision loss.  Follow up with me in 2 weeks with your blood sugar readings.

## 2016-12-06 ENCOUNTER — Other Ambulatory Visit: Payer: Self-pay | Admitting: Family Medicine

## 2016-12-06 DIAGNOSIS — S30860D Insect bite (nonvenomous) of lower back and pelvis, subsequent encounter: Secondary | ICD-10-CM

## 2016-12-06 DIAGNOSIS — L03312 Cellulitis of back [any part except buttock]: Secondary | ICD-10-CM

## 2016-12-06 DIAGNOSIS — W57XXXD Bitten or stung by nonvenomous insect and other nonvenomous arthropods, subsequent encounter: Principal | ICD-10-CM

## 2016-12-14 ENCOUNTER — Other Ambulatory Visit: Payer: Self-pay | Admitting: Family Medicine

## 2016-12-15 ENCOUNTER — Ambulatory Visit: Payer: Self-pay | Admitting: Pharmacist

## 2016-12-15 NOTE — Progress Notes (Deleted)
    S:     No chief complaint on file.   Patient arrives in good spirits.  Presents for diabetes evaluation, education, and management [facility] Jegede. Patient was referred on 11/17/16.  Patient was last seen by Primary Care Provider on 11/17/16.   Patient denies adherence with medications. She reports missing her Lantus 2-3 times a week but denies missing the Janumet except for today Current diabetes medications include: Lantus 25 units daily, sitagliptin-metformin 50-1000 mg BID.   Patient denies hypoglycemic events.  Patient reported dietary habits: poor financial situation so has to eat what she has access to.   Patient denies nocturia.  Patient reports neuropathy. Patient denies visual changes. Patient reports self foot exams.   Patient reports that she doesn't have a blood glucose meter that works.  O:  Physical Exam   ROS   Lab Results  Component Value Date   HGBA1C 11.5 11/03/2016   There were no vitals filed for this visit.  Home fasting CBG: none 2 hour post-prandial/random CBG: none  POCT glucose = 315   A/P: Diabetes longstanding currently UNcontrolled based on A1c of 11.5. Patient denies hypoglycemic events and is able to verbalize appropriate hypoglycemia management plan. Patient denies adherence with medication. Control is suboptimal due to medication nonadherence.  Continue Lantus and Janumet as prescribed. Patient to go take Janumet dose she missed this morning now. Declines insulin injection in clinic. Stressed the importance of taking her medications every single day to avoid or delay the onset of the complications of diabetes. Ordered new blood glucose monitor and supplies and instructed her to check it every day and then come back in 2 weeks with the numbers.   Next A1C anticipated September 2018.    Written patient instructions provided.  Total time in face to face counseling 15 minutes.   Follow up in Pharmacist  Clinic Visit in 2 weeks.

## 2017-01-19 ENCOUNTER — Emergency Department (HOSPITAL_COMMUNITY): Payer: Self-pay

## 2017-01-19 ENCOUNTER — Emergency Department (HOSPITAL_COMMUNITY)
Admission: EM | Admit: 2017-01-19 | Discharge: 2017-01-19 | Disposition: A | Discharge status: Home / Self Care | Payer: Self-pay | Attending: Emergency Medicine | Admitting: Emergency Medicine

## 2017-01-19 ENCOUNTER — Other Ambulatory Visit: Payer: Self-pay

## 2017-01-19 ENCOUNTER — Encounter (HOSPITAL_COMMUNITY): Payer: Self-pay | Admitting: Emergency Medicine

## 2017-01-19 DIAGNOSIS — Z79899 Other long term (current) drug therapy: Secondary | ICD-10-CM | POA: Insufficient documentation

## 2017-01-19 DIAGNOSIS — I10 Essential (primary) hypertension: Secondary | ICD-10-CM | POA: Insufficient documentation

## 2017-01-19 DIAGNOSIS — F17210 Nicotine dependence, cigarettes, uncomplicated: Secondary | ICD-10-CM | POA: Insufficient documentation

## 2017-01-19 DIAGNOSIS — E119 Type 2 diabetes mellitus without complications: Secondary | ICD-10-CM | POA: Insufficient documentation

## 2017-01-19 DIAGNOSIS — R1031 Right lower quadrant pain: Secondary | ICD-10-CM | POA: Insufficient documentation

## 2017-01-19 LAB — URINALYSIS, ROUTINE W REFLEX MICROSCOPIC
Bilirubin Urine: NEGATIVE
Glucose, UA: >=500 mg/dL — AB
Hgb urine dipstick: NEGATIVE
Ketones, ur: NEGATIVE mg/dL
Nitrite: NEGATIVE
Protein, ur: NEGATIVE mg/dL
Specific Gravity, Urine: 1.039 — ABNORMAL HIGH (ref 1.005–1.030)
pH: 5 (ref 5.0–8.0)

## 2017-01-19 LAB — COMPREHENSIVE METABOLIC PANEL
ALT: 13 U/L — ABNORMAL LOW (ref 14–54)
AST: 14 U/L — ABNORMAL LOW (ref 15–41)
Albumin: 3.8 g/dL (ref 3.5–5.0)
Alkaline Phosphatase: 130 U/L — ABNORMAL HIGH (ref 38–126)
Anion gap: 11 (ref 5–15)
BUN: 19 mg/dL (ref 6–20)
CO2: 25 mmol/L (ref 22–32)
Calcium: 9.7 mg/dL (ref 8.9–10.3)
Chloride: 100 mmol/L — ABNORMAL LOW (ref 101–111)
Creatinine, Ser: 0.5 mg/dL (ref 0.44–1.00)
GFR calc Af Amer: >60 mL/min (ref >60)
GFR calc non Af Amer: >60 mL/min (ref >60)
Glucose, Bld: 343 mg/dL — ABNORMAL HIGH (ref 65–99)
Potassium: 3.9 mmol/L (ref 3.5–5.1)
Sodium: 136 mmol/L (ref 135–145)
Total Bilirubin: 0.8 mg/dL (ref 0.3–1.2)
Total Protein: 7.6 g/dL (ref 6.5–8.1)

## 2017-01-19 LAB — CBC WITH DIFFERENTIAL/PLATELET
Basophils Absolute: 0.1 10*3/uL (ref 0.0–0.1)
Basophils Relative: 1 %
Eosinophils Absolute: 0.1 10*3/uL (ref 0.0–0.7)
Eosinophils Relative: 1 %
HCT: 44 % (ref 36.0–46.0)
Hemoglobin: 15.7 g/dL — ABNORMAL HIGH (ref 12.0–15.0)
Lymphocytes Relative: 38 %
Lymphs Abs: 3.7 10*3/uL (ref 0.7–4.0)
MCH: 31 pg (ref 26.0–34.0)
MCHC: 35.7 g/dL (ref 30.0–36.0)
MCV: 87 fL (ref 78.0–100.0)
Monocytes Absolute: 0.9 10*3/uL (ref 0.1–1.0)
Monocytes Relative: 9 %
Neutro Abs: 4.9 10*3/uL (ref 1.7–7.7)
Neutrophils Relative %: 51 %
Platelets: 263 10*3/uL (ref 150–400)
RBC: 5.06 MIL/uL (ref 3.87–5.11)
RDW: 13 % (ref 11.5–15.5)
WBC: 9.6 10*3/uL (ref 4.0–10.5)

## 2017-01-19 LAB — WET PREP, GENITAL
Clue Cells Wet Prep HPF POC: NONE SEEN
Sperm: NONE SEEN
Trich, Wet Prep: NONE SEEN
Yeast Wet Prep HPF POC: NONE SEEN

## 2017-01-19 LAB — I-STAT CG4 LACTIC ACID, ED
Lactic Acid, Venous: 2.5 mmol/L (ref 0.5–1.9)
Lactic Acid, Venous: 2.08 mmol/L (ref 0.5–1.9)

## 2017-01-19 MED ORDER — SODIUM CHLORIDE 0.9 % IV BOLUS (SEPSIS)
1000.0000 mL | Freq: Once | INTRAVENOUS | Status: AC
  Administered 2017-01-19: 1000 mL via INTRAVENOUS

## 2017-01-19 MED ORDER — SODIUM CHLORIDE 0.9 % IV BOLUS (SEPSIS)
500.0000 mL | Freq: Once | INTRAVENOUS | Status: AC
  Administered 2017-01-19: 500 mL via INTRAVENOUS

## 2017-01-19 MED ORDER — IOPAMIDOL (ISOVUE-300) INJECTION 61%
100.0000 mL | Freq: Once | INTRAVENOUS | Status: AC | PRN
  Administered 2017-01-19: 100 mL via INTRAVENOUS

## 2017-01-19 MED ORDER — IOPAMIDOL (ISOVUE-300) INJECTION 61%
INTRAVENOUS | Status: AC
  Filled 2017-01-19: qty 100

## 2017-01-19 MED ORDER — ONDANSETRON HCL 4 MG/2ML IJ SOLN
4.0000 mg | Freq: Once | INTRAMUSCULAR | Status: AC
  Administered 2017-01-19: 4 mg via INTRAVENOUS
  Filled 2017-01-19: qty 2

## 2017-01-19 MED ORDER — MORPHINE SULFATE (PF) 4 MG/ML IV SOLN
4.0000 mg | Freq: Once | INTRAVENOUS | Status: AC
  Administered 2017-01-19: 4 mg via INTRAVENOUS
  Filled 2017-01-19: qty 1

## 2017-01-19 NOTE — ED Provider Notes (Signed)
7:28 AM BP 113/73   Pulse 77   Temp 98.3 F (36.8 C) (Oral)   Resp 18   SpO2 94%  [facility] Patient presents with right lower quadrant abdominal pain. Labs show elevated lactic acidosis, which I suspect is from dehydration given her elevated blood sugars, high specific gravity in her urine and elevated hemoglobin. CT is negative. Currently pending ultrasound. After right adnexal pain on pelvic examination.  Patient's US lnegative. Will dc home with pcp follow up .    Arthor CaptainHarris, Bianka Liberati, PA-C 01/20/17 1708    Raeford RazorKohut, Stephen, MD 01/26/17 [phone]

## 2017-01-19 NOTE — ED Provider Notes (Signed)
[facility] DEPT Provider Note   CSN: [id number] Arrival date & time: 01/19/17  0301     History   Chief Complaint Chief Complaint  Patient presents with  . Flank Pain    HPI Sarah Charles is a 56 y.o. female with a hx of Arthritis, depression, and some of diabetes, GERD, hypertension, sleep apnea presents to the Emergency Department complaining of gradual, persistent, progressively worsening right lower quadrant abdominal pain onset yesterday morning and gradually worsening throughout the day. Patient reports that around 11 PM last night the pain became "unbearable."  Nothing seems to make her symptoms better or worse. She denies fevers or chills, vomiting, diarrhea, weakness, dizziness, syncope. Patient reports she's had a normal bowel movement today without melena or hematochezia. She denies constipation or diarrhea. Patient history of cholecystectomy but no additional abdominal surgeries. Patient denies international travel or recent sick contacts. Patient reports no menstruation in last 4 years. She denies sexual activity.   The history is provided by the patient and medical records. No language interpreter was used.    Past Medical History:  Diagnosis Date  . Arthritis   . Depression   . Diabetes mellitus   . GERD (gastroesophageal reflux disease)   . Hypercholesterolemia   . Hyperlipidemia   . Hypertension   . Sleep apnea    pt sts does not use "cause i dont think I need it anymore".    Patient Active Problem List   Diagnosis Date Noted  . Depression 05/31/2016  . Hypertension 05/31/2016  . Uncontrolled type 2 diabetes mellitus with complication ([place]) 01/60/1093  . Type 2 diabetes mellitus with complication ([place]) 23/55/7322  . Cellulitis of breast 12/29/2013  . Diabetes mellitus ([place]) 12/29/2013  . Cellulitis of female breast 12/29/2013  . Symptomatic menopausal or female climacteric states 04/08/2008  . OTHER SPECIFIED DISEASE OF NAIL 04/08/2008  . URINARY  URGENCY 04/08/2008  . COUGH 04/02/2008  . HYPERTENSION, BENIGN ESSENTIAL 04/12/2007  . FATIGUE 04/12/2007  . OBESITY, NOS 07/21/2006  . Major depressive disorder, recurrent episode ([place]) 07/21/2006  . TOBACCO DEPENDENCE 07/21/2006  . GASTROESOPHAGEAL REFLUX, NO ESOPHAGITIS 07/21/2006  . INCONTINENCE, STRESS, FEMALE 07/21/2006    Past Surgical History:  Procedure Laterality Date  . CHOLECYSTECTOMY    . INCISION AND DRAINAGE ABSCESS Left 12/22/2012   Procedure: INCISION AND DRAINAGE ABSCESS;  Surgeon: Jamesetta So, MD;  Location: [facility];  Service: General;  Laterality: Left;  . TONSILLECTOMY    . TUBAL LIGATION      OB History    No data available       Home Medications    Prior to Admission medications   Medication Sig Start Date End Date Taking? Authorizing Provider  atorvastatin (LIPITOR) 40 MG tablet Take 1 tablet (40 mg total) by mouth daily. 09/22/16  Yes Hairston, Maylon Peppers, FNP  buPROPion (WELLBUTRIN SR) 150 MG 12 hr tablet Take 1 tablet (150 mg total) by mouth 2 (two) times daily. 09/22/16  Yes Hairston, Maylon Peppers, FNP  gabapentin (NEURONTIN) 300 MG capsule Take 1 capsule (300 mg total) by mouth at bedtime. 09/22/16  Yes Hairston, Mandesia R, FNP  insulin glargine (LANTUS) 100 UNIT/ML injection Inject 0.25 mLs (25 Units total) into the skin at bedtime. 11/29/16  Yes Tresa Garter, MD  lisinopril (PRINIVIL,ZESTRIL) 20 MG tablet Take 1 tablet (20 mg total) by mouth daily. 11/17/16  Yes Hairston, Maylon Peppers, FNP  omeprazole (PRILOSEC) 20 MG capsule Take 1 capsule (20 mg total) by mouth daily. 09/22/16  Yes Hairston, Maylon Peppers, FNP  sitaGLIPtin-metformin (JANUMET) 50-1000 MG tablet Take 1 tablet by mouth 2 (two) times daily with a meal. 11/29/16  Yes Jegede, Olugbemiga E, MD  Blood Glucose Monitoring Suppl (TRUE METRIX METER) w/Device KIT Use as directed 12/01/16   Angelica Chessman E, MD  glucose blood (TRUE METRIX BLOOD GLUCOSE TEST) test strip Use as instructed 12/01/16    Tresa Garter, MD  ibuprofen (ADVIL,MOTRIN) 600 MG tablet Take 1 tablet (600 mg total) by mouth every 6 (six) hours as needed. Patient not taking: Reported on 12/01/2016 07/14/16   Shary Decamp, PA-C  TRUEPLUS LANCETS 28G MISC Use as directed 12/01/16   Tresa Garter, MD    Family History Family History  Problem Relation Age of Onset  . Hypertension Mother   . CVA Mother   . COPD Mother   . Heart disease Mother   . Kidney Stones Mother   . CAD Father   . Hypercholesterolemia Father   . Heart attack Father   . Cancer Maternal Uncle   . Diabetes Maternal Grandmother   . Cancer Maternal Grandfather        lung cancer    Social History Social History  Substance Use Topics  . Smoking status: Current Every Day Smoker    Packs/day: 0.50    Years: 41.00    Types: Cigarettes  . Smokeless tobacco: Never Used  . Alcohol use No     Allergies   Cymbalta [duloxetine hcl]   Review of Systems Review of Systems  Constitutional: Negative for appetite change, diaphoresis, fatigue, fever and unexpected weight change.  HENT: Negative for mouth sores and trouble swallowing.   Respiratory: Negative for cough, chest tightness, shortness of breath, wheezing and stridor.   Cardiovascular: Negative for chest pain and palpitations.  Gastrointestinal: Positive for abdominal pain and nausea. Negative for abdominal distention, blood in stool, constipation, diarrhea, rectal pain and vomiting.  Genitourinary: Negative for difficulty urinating, dysuria, flank pain, frequency, hematuria and urgency.  Musculoskeletal: Negative for back pain, neck pain and neck stiffness.  Skin: Negative for rash.  Neurological: Negative for weakness.  Hematological: Negative for adenopathy.  Psychiatric/Behavioral: Negative for confusion.  All other systems reviewed and are negative.    Physical Exam Updated Vital Signs BP (!) 137/93 (BP Location: Left Arm)   Pulse 69   Temp 97.7 F (36.5 C) (Oral)    Resp 20   SpO2 98%   Physical Exam  Constitutional: She is oriented to person, place, and time. She appears well-developed and well-nourished. No distress.  Awake, alert, nontoxic appearance  HENT:  Head: Normocephalic and atraumatic.  Mouth/Throat: Oropharynx is clear and moist. No oropharyngeal exudate.  Eyes: Conjunctivae are normal. No scleral icterus.  Neck: Normal range of motion. Neck supple.  Cardiovascular: Normal rate, regular rhythm, normal heart sounds and intact distal pulses.   No murmur heard. Capillary refill < 3 sec  Pulmonary/Chest: Effort normal and breath sounds normal. No respiratory distress. She has no wheezes.  Equal chest expansion  Abdominal: Soft. Bowel sounds are normal. She exhibits no mass. There is tenderness in the right lower quadrant and suprapubic area. There is no rigidity, no rebound, no guarding and no CVA tenderness. Hernia confirmed negative in the right inguinal area and confirmed negative in the left inguinal area.  Genitourinary: Uterus normal. No labial fusion. There is no rash, tenderness or lesion on the right labia. There is no rash, tenderness or lesion on the left labia. Uterus is not  deviated, not enlarged, not fixed and not tender. Cervix exhibits no motion tenderness, no discharge and no friability. Right adnexum displays tenderness. Right adnexum displays no mass and no fullness. Left adnexum displays no mass, no tenderness and no fullness. No erythema, tenderness or bleeding in the vagina. No foreign body in the vagina. No signs of injury around the vagina. No vaginal discharge found.  Musculoskeletal: Normal range of motion. She exhibits no edema.  Lymphadenopathy:       Right: No inguinal adenopathy present.       Left: No inguinal adenopathy present.  Neurological: She is alert and oriented to person, place, and time.  Speech is clear and goal oriented Moves extremities without ataxia  Skin: Skin is warm and dry. She is not  diaphoretic. No erythema.  Psychiatric: She has a normal mood and affect.  Nursing note and vitals reviewed.    ED Treatments / Results  Labs (all labs ordered are listed, but only abnormal results are displayed) Labs Reviewed  URINALYSIS, ROUTINE W REFLEX MICROSCOPIC - Abnormal; Notable for the following:       Result Value   Specific Gravity, Urine 1.039 (*)    Glucose, UA >=500 (*)    Leukocytes, UA SMALL (*)    Bacteria, UA RARE (*)    Squamous Epithelial / LPF 0-5 (*)    All other components within normal limits  CBC WITH DIFFERENTIAL/PLATELET - Abnormal; Notable for the following:    Hemoglobin 15.7 (*)    All other components within normal limits  COMPREHENSIVE METABOLIC PANEL - Abnormal; Notable for the following:    Chloride 100 (*)    Glucose, Bld 343 (*)    AST 14 (*)    ALT 13 (*)    Alkaline Phosphatase 130 (*)    All other components within normal limits  I-STAT CG4 LACTIC ACID, ED - Abnormal; Notable for the following:    Lactic Acid, Venous 2.50 (*)    All other components within normal limits  I-STAT CG4 LACTIC ACID, ED - Abnormal; Notable for the following:    Lactic Acid, Venous 2.08 (*)    All other components within normal limits  WET PREP, GENITAL  GC/CHLAMYDIA PROBE AMP ([place]) NOT AT [facility]    Radiology Ct Abdomen Pelvis W Contrast  Result Date: 01/19/2017 CLINICAL DATA:  Right lower quadrant pain, onset at 08:30 yesterday. EXAM: CT ABDOMEN AND PELVIS WITH CONTRAST TECHNIQUE: Multidetector CT imaging of the abdomen and pelvis was performed using the standard protocol following bolus administration of intravenous contrast. CONTRAST:  168m ISOVUE-300 IOPAMIDOL (ISOVUE-300) INJECTION 61% COMPARISON:  01/29/2008 FINDINGS: Lower chest: No acute abnormality. Hepatobiliary: No focal liver abnormality is seen. Status post cholecystectomy. No biliary dilatation. Pancreas: Unremarkable. No pancreatic ductal dilatation or surrounding inflammatory changes.  Spleen: Normal in size without focal abnormality. Adrenals/Urinary Tract: Adrenal glands are unremarkable. Kidneys are normal, without renal calculi, focal lesion, or hydronephrosis. Bladder is unremarkable. Stomach/Bowel: Stomach is within normal limits. Appendix is normal. Mild uncomplicated colonic diverticulosis. No evidence of bowel wall thickening, distention, or inflammatory changes. Vascular/Lymphatic: The abdominal aorta is normal in caliber with moderate atherosclerotic calcification. No adenopathy in the abdomen or pelvis. Reproductive: Uterus and bilateral adnexa are unremarkable. Other: No focal inflammation. No ascites. Small fat containing umbilical hernia. Musculoskeletal: No significant skeletal lesion. IMPRESSION: 1. Normal appendix 2. Uncomplicated mild colonic diverticulosis. 3. Aortic atherosclerosis. 4. No acute findings are evident in the abdomen or pelvis. Electronically Signed   By: DShaune Pascal  Alroy Dust M.D.   On: 01/19/2017 05:38    Procedures Procedures (including critical care time)  Medications Ordered in ED Medications  iopamidol (ISOVUE-300) 61 % injection (not administered)  sodium chloride 0.9 % bolus 1,000 mL (not administered)  sodium chloride 0.9 % bolus 1,000 mL (0 mLs Intravenous Stopped 01/19/17 0654)  morphine 4 MG/ML injection 4 mg (4 mg Intravenous Given 01/19/17 0435)  ondansetron (ZOFRAN) injection 4 mg (4 mg Intravenous Given 01/19/17 0434)  iopamidol (ISOVUE-300) 61 % injection 100 mL (100 mLs Intravenous Contrast Given 01/19/17 0519)  sodium chloride 0.9 % bolus 500 mL (500 mLs Intravenous New Bag/Given 01/19/17 0654)     Initial Impression / Assessment and Plan / ED Course  I have reviewed the triage vital signs and the nursing notes.  Pertinent labs & imaging results that were available during my care of the patient were reviewed by me and considered in my medical decision making (see chart for details).  Clinical Course as of Jan 19 729  Wed Jan 19, 2017  0719 Patient's pain is improved. She reports her nausea is also improved. She has been given a fluid bolus. She remains without tachycardia or hypotension. Afebrile.  [HM]  [id number] Specific Gravity, Urine: (!) 1.039 [AH]  0729 Lactic Acid, Venous: (!!) 2.08 [AH]  0729 Suspect dehydration  Hemoglobin: (!) 15.7 [AH]    Clinical Course User Index [AH] Margarita Mail, PA-C [HM] Baleigh Rennaker, Jarrett [place], [place]    Patient presents with right lower quadrant abdominal pain. CT scan without acute abnormalities on labs reassuring. Pelvic exam with right pelvic pain. Will obtain ultrasound to rule out ovarian torsion. Slightly elevated lactic acid at 2.5. Patient has received fluids. Will recheck.  At shift change care was transferred to Doreen Salvage, PA-C who will follow pending studies, re-evaulate and determine disposition.     Final Clinical Impressions(s) / ED Diagnoses   Final diagnoses:  RLQ abdominal pain    New Prescriptions New Prescriptions   No medications on file     Agapito Games 01/19/17 [place], Delice Bison, DO 01/19/17 [phone]

## 2017-01-19 NOTE — Discharge Instructions (Signed)
YOUR ULTRASOUND, CT SCAN AND LABS SHOWED NO EMERGENT ABNORMALITIES. Although the cause of your pain cannot be determined today, you do not appear to have any life, limb, or organ threatening causes of pain. If you are continuing to have pain you should make an appointment with your primary care physician for further workup. Use Motrin or Tylenol for pain relief.  Abdominal (belly) pain can be caused by many things. Your caregiver performed an examination and possibly ordered blood/urine tests and imaging (CT scan, x-rays, ultrasound). Many cases can be observed and treated at home after initial evaluation in the emergency department. Even though you are being discharged home, abdominal pain can be unpredictable. Therefore, you need a repeated exam if your pain does not resolve, returns, or worsens. Most patients with abdominal pain don't have to be admitted to the hospital or have surgery, but serious problems like appendicitis and gallbladder attacks can start out as nonspecific pain. Many abdominal conditions cannot be diagnosed in one visit, so follow-up evaluations are very important. SEEK IMMEDIATE MEDICAL ATTENTION IF: The pain does not go away or becomes severe.  A temperature above 101 develops.  Repeated vomiting occurs (multiple episodes).  The pain becomes localized to portions of the abdomen. The right side could possibly be appendicitis. In an adult, the left lower portion of the abdomen could be colitis or diverticulitis.  Blood is being passed in stools or vomit (bright red or black tarry stools).  Return also if you develop chest pain, difficulty breathing, dizziness or fainting, or become confused, poorly responsive, or inconsolable (young children).

## 2017-01-19 NOTE — ED Triage Notes (Signed)
Pt states she is having right flank pain that started about 8am on Tuesday and has progressively gotten worse  Pt has nausea without vomiting  Pt states she has had a bowel movement and it was normal  Pt states the pain goes from a [place] pain to a throbbing pain

## 2017-01-20 LAB — GC/CHLAMYDIA PROBE AMP (~~LOC~~) NOT AT ARMC
Chlamydia: NEGATIVE
Neisseria Gonorrhea: NEGATIVE

## 2017-01-31 ENCOUNTER — Telehealth: Payer: Self-pay | Admitting: Family Medicine

## 2017-01-31 DIAGNOSIS — Z794 Long term (current) use of insulin: Secondary | ICD-10-CM

## 2017-01-31 DIAGNOSIS — E1142 Type 2 diabetes mellitus with diabetic polyneuropathy: Secondary | ICD-10-CM

## 2017-01-31 DIAGNOSIS — E118 Type 2 diabetes mellitus with unspecified complications: Secondary | ICD-10-CM

## 2017-01-31 DIAGNOSIS — K219 Gastro-esophageal reflux disease without esophagitis: Secondary | ICD-10-CM

## 2017-01-31 DIAGNOSIS — E782 Mixed hyperlipidemia: Secondary | ICD-10-CM

## 2017-01-31 DIAGNOSIS — IMO0002 Reserved for concepts with insufficient information to code with codable children: Secondary | ICD-10-CM

## 2017-01-31 DIAGNOSIS — F329 Major depressive disorder, single episode, unspecified: Secondary | ICD-10-CM

## 2017-01-31 DIAGNOSIS — E1165 Type 2 diabetes mellitus with hyperglycemia: Secondary | ICD-10-CM

## 2017-01-31 DIAGNOSIS — F32A Depression, unspecified: Secondary | ICD-10-CM

## 2017-01-31 DIAGNOSIS — I10 Essential (primary) hypertension: Secondary | ICD-10-CM

## 2017-01-31 MED ORDER — OMEPRAZOLE 20 MG PO CPDR: 20.0000 mg | DELAYED_RELEASE_CAPSULE | Freq: Every day | ORAL | 0 refills | Status: DC

## 2017-01-31 MED ORDER — ATORVASTATIN CALCIUM 40 MG PO TABS: 40.0000 mg | ORAL_TABLET | Freq: Every day | ORAL | 0 refills | Status: DC

## 2017-01-31 MED ORDER — INSULIN GLARGINE 100 UNIT/ML ~~LOC~~ SOLN: 25.0000 [IU] | Freq: Every day | SUBCUTANEOUS | 0 refills | Status: DC

## 2017-01-31 MED ORDER — GABAPENTIN 300 MG PO CAPS: 300.0000 mg | ORAL_CAPSULE | Freq: Every day | ORAL | 2 refills | Status: DC

## 2017-01-31 MED ORDER — BUPROPION HCL ER (SR) 150 MG PO TB12: 150.0000 mg | ORAL_TABLET | Freq: Two times a day (BID) | ORAL | 0 refills | Status: DC

## 2017-01-31 MED ORDER — SITAGLIPTIN PHOS-METFORMIN HCL 50-1000 MG PO TABS: 1.0000 | ORAL_TABLET | Freq: Two times a day (BID) | ORAL | 0 refills | Status: DC

## 2017-01-31 MED ORDER — LISINOPRIL 20 MG PO TABS: 20.0000 mg | ORAL_TABLET | Freq: Every day | ORAL | 0 refills | Status: DC

## 2017-01-31 NOTE — Telephone Encounter (Signed)
Medications refilled

## 2017-01-31 NOTE — Telephone Encounter (Signed)
Pt. Came to the facility requesting a refill on all her current medications.  Pt. Would like Rx sent to [facility]. Please f/u

## 2017-02-08 MED FILL — BUPROPION SR 150 MG TABLET: 150 | 30 days supply | Qty: 60 | Fill #1

## 2017-02-08 MED FILL — ?METFORMIN HCL 1,000 MG TAB: 1000 | 30 days supply | Qty: 60 | Fill #0

## 2017-02-08 MED FILL — TRUEplus LANCETS 28G MISC: 25 days supply | Qty: 100 | Fill #1

## 2017-02-08 MED FILL — LISINOPRIL 20 MG TABLET: 20 | 30 days supply | Qty: 30 | Fill #1

## 2017-02-08 MED FILL — TRUE METRIX TEST STRIP: 25 days supply | Qty: 100 | Fill #1

## 2017-02-08 MED FILL — GABAPENTIN 300 MG CAPSULE: 300 | 30 days supply | Qty: 30 | Fill #1

## 2017-02-22 MED FILL — ?OMEPRAZOLE DR 20 MG CAPSUL: 20 | 30 days supply | Qty: 30 | Fill #1

## 2017-02-22 MED FILL — LISINOPRIL 20 MG TABLET: 20 | 30 days supply | Qty: 30 | Fill #2

## 2017-02-22 MED FILL — $LANTUS SOLOSTAR 100 UNITS/: 100 | 36 days supply | Qty: 9 | Fill #1

## 2017-03-24 ENCOUNTER — Other Ambulatory Visit: Payer: Self-pay | Admitting: Pharmacist

## 2017-03-24 MED ORDER — INSULIN GLARGINE 100 UNIT/ML SOLOSTAR PEN: 25.0000 [IU] | PEN_INJECTOR | Freq: Every day | SUBCUTANEOUS | 0 refills | Status: DC

## 2017-04-06 ENCOUNTER — Other Ambulatory Visit: Payer: Self-pay | Admitting: Family Medicine

## 2017-05-30 ENCOUNTER — Ambulatory Visit: Payer: Medicaid Other | Attending: Family Medicine | Admitting: Family Medicine

## 2017-05-30 ENCOUNTER — Encounter: Payer: Self-pay | Admitting: Family Medicine

## 2017-05-30 VITALS — BP 159/80 | HR 74 | Temp 97.5°F | Resp 18 | Ht 62.0 in | Wt 174.0 lb

## 2017-05-30 DIAGNOSIS — I10 Essential (primary) hypertension: Secondary | ICD-10-CM | POA: Diagnosis not present

## 2017-05-30 DIAGNOSIS — E782 Mixed hyperlipidemia: Secondary | ICD-10-CM | POA: Diagnosis not present

## 2017-05-30 DIAGNOSIS — R110 Nausea: Secondary | ICD-10-CM | POA: Insufficient documentation

## 2017-05-30 DIAGNOSIS — K219 Gastro-esophageal reflux disease without esophagitis: Secondary | ICD-10-CM | POA: Diagnosis not present

## 2017-05-30 DIAGNOSIS — Z794 Long term (current) use of insulin: Secondary | ICD-10-CM | POA: Insufficient documentation

## 2017-05-30 DIAGNOSIS — F329 Major depressive disorder, single episode, unspecified: Secondary | ICD-10-CM | POA: Diagnosis not present

## 2017-05-30 DIAGNOSIS — E1165 Type 2 diabetes mellitus with hyperglycemia: Secondary | ICD-10-CM | POA: Insufficient documentation

## 2017-05-30 DIAGNOSIS — E118 Type 2 diabetes mellitus with unspecified complications: Secondary | ICD-10-CM | POA: Diagnosis not present

## 2017-05-30 DIAGNOSIS — IMO0002 Reserved for concepts with insufficient information to code with codable children: Secondary | ICD-10-CM

## 2017-05-30 DIAGNOSIS — E1142 Type 2 diabetes mellitus with diabetic polyneuropathy: Secondary | ICD-10-CM | POA: Diagnosis not present

## 2017-05-30 DIAGNOSIS — F32A Depression, unspecified: Secondary | ICD-10-CM

## 2017-05-30 LAB — GLUCOSE, POCT (MANUAL RESULT ENTRY): POC Glucose: 388 mg/dl — AB (ref 70–99)

## 2017-05-30 LAB — POCT URINALYSIS DIPSTICK
Glucose, UA: >=1000
Ketones, UA: 40
Leukocytes, UA: NEGATIVE
Nitrite, UA: NEGATIVE
Spec Grav, UA: 1.01 (ref 1.010–1.025)
Urobilinogen, UA: 0.2 E.U./dL
pH, UA: 5 (ref 5.0–8.0)

## 2017-05-30 LAB — POCT GLYCOSYLATED HEMOGLOBIN (HGB A1C): Hemoglobin A1C: 10.8

## 2017-05-30 MED ORDER — INSULIN GLARGINE 100 UNIT/ML SOLOSTAR PEN: 28.0000 [IU] | PEN_INJECTOR | Freq: Every day | SUBCUTANEOUS | 2 refills | Status: DC

## 2017-05-30 MED ORDER — TRUEPLUS LANCETS 28G MISC
12 refills | Status: DC
Start: 2017-05-30 — End: 2018-11-09

## 2017-05-30 MED ORDER — GLUCOSE BLOOD VI STRP: ORAL_STRIP | 12 refills | Status: DC

## 2017-05-30 MED ORDER — BUPROPION HCL ER (SR) 150 MG PO TB12: 150.0000 mg | ORAL_TABLET | Freq: Two times a day (BID) | ORAL | 0 refills | Status: DC

## 2017-05-30 MED ORDER — SITAGLIPTIN PHOS-METFORMIN HCL 50-1000 MG PO TABS: 1.0000 | ORAL_TABLET | Freq: Two times a day (BID) | ORAL | 0 refills | Status: DC

## 2017-05-30 MED ORDER — METOCLOPRAMIDE HCL 10 MG PO TABS: 10.0000 mg | ORAL_TABLET | Freq: Three times a day (TID) | ORAL | 0 refills | Status: DC | PRN

## 2017-05-30 MED ORDER — GABAPENTIN 300 MG PO CAPS: 300.0000 mg | ORAL_CAPSULE | Freq: Two times a day (BID) | ORAL | 2 refills | Status: DC

## 2017-05-30 MED ORDER — OMEPRAZOLE 20 MG PO CPDR: 20.0000 mg | DELAYED_RELEASE_CAPSULE | Freq: Every day | ORAL | 0 refills | Status: DC

## 2017-05-30 MED ORDER — LISINOPRIL 20 MG PO TABS: 20.0000 mg | ORAL_TABLET | Freq: Every day | ORAL | 0 refills | Status: DC

## 2017-05-30 MED FILL — GABAPENTIN 300 MG CAPSULE: 300 | 30 days supply | Qty: 60 | Fill #0

## 2017-05-30 MED FILL — LISINOPRIL 20 MG TAB: 20 | 30 days supply | Qty: 30 | Fill #0

## 2017-05-30 MED FILL — METOCLOPRAMIDE 10 MG TABLET: 10 | 13 days supply | Qty: 40 | Fill #0

## 2017-05-30 MED FILL — OMEPRAZOLE DR 20 MG CAPSULE: 20 | 30 days supply | Qty: 30 | Fill #0

## 2017-05-30 MED FILL — LANTUS SOLOSTAR 100 UNITS/M: 100 | 32 days supply | Qty: 9 | Fill #0

## 2017-05-30 MED FILL — BUPROPION SR 150 MG TABLET: 150 | 30 days supply | Qty: 60 | Fill #0

## 2017-05-30 NOTE — Patient Instructions (Signed)
Diabetes Mellitus and Sick Day Management Blood sugar (glucose) can be difficult to control when you are sick. Common illnesses that can cause problems for people with diabetes (diabetes mellitus) include colds, fever, flu (influenza), nausea, vomiting, and diarrhea. These illnesses can cause stress and loss of body fluids (dehydration), and those issues can cause blood glucose levels to increase. Because of this, it is very important to take your insulin and diabetes medicines and eat some form of carbohydrate when you are sick. You should make a plan for days when you are sick (sick day plan) as part of your diabetes management plan. You and your health care provider should make this plan in advance. The following guidelines are intended to help you manage an illness that lasts for about 24 hours or less. Your health care provider may also give you more specific instructions. What do I need to do to manage my blood glucose?  Check your blood glucose every 2-4 hours, or as often as told by your health care provider.  Know your sick day treatment goals. Your target blood glucose levels may be different when you are sick.  If you use insulin, take your usual dose. ? If your blood glucose continues to be too high, you may need to take an additional insulin dose as told by your health care provider.  If you use oral diabetes medicine, you may need to stop taking it if you are not able to eat or drink normally. Ask your health care provider about whether you need to stop taking these medicines while you are sick.  If you use injectable hormone medicines other than insulin to control your diabetes, ask your health care provider about whether you need to stop taking these medicines while you are sick. What else can I do to manage my diabetes when I am sick? Check your ketones  If you have type 1 diabetes, check your urine ketones every 4 hours.  If you have type 2 diabetes, check your urine ketones as  often as told by your health care provider. Drink fluids  Drink enough fluid to keep your urine clear or pale yellow. This is especially important if you have a fever, vomiting, or diarrhea. Those symptoms can lead to dehydration.  Follow any instructions from your health care provider about beverages to avoid. ? Do not drink alcohol, caffeine, or drinks that contain a lot of sugar. Take medicines as directed  Take-over-the-counter and prescription medicines only as told by your health care provider.  Check medicine labels for added sugars. Some medicines may contain sugar or types of sugars that can raise your blood glucose level. What foods can I eat when I am sick? You need to eat some form of carbohydrates when you are sick. You should eat 45-50 grams (45-50 g) of carbohydrates every 3-4 hours until you feel better. All of the food choices below contain about 15 g of carbohydrates. Plan ahead and keep some of these foods around so you have them if you get sick.  4-6 oz (120-177 mL) carbonated beverage that contains sugar, such as regular (not diet) soda. You may be able to drink carbonated beverages more easily if you open the beverage and let it sit at room temperature for a few minutes before drinking.   of a twin frozen ice pop.  4 oz (120 g) regular gelatin.  4 oz (120 mL) fruit juice.  4 oz (120 g) ice cream or frozen yogurt.  2 oz (60   g) sherbet.  8 oz (240 mL) clear broth or soup.  4 oz (120 g) regular custard.  4 oz (120 g) regular pudding.  8 oz (240 g) plain yogurt.  1 slice bread or toast.  6 saltine crackers.  5 vanilla wafers.  Questions to ask your health care provider Consider asking the following questions so you know what to do on days when you are sick:  Should I adjust my diabetes medicines?  How often do I need to check my blood glucose?  What supplies do I need to manage my diabetes at home when I am sick?  What number can I call if I have  questions?  What foods and drinks should I avoid?  Contact a health care provider if:  You develop symptoms of diabetic ketoacidosis, such as: ? Fatigue. ? Weight loss. ? Excessive thirst. ? Light-headedness. ? Fruity or sweet-smelling breath. ? Excessive urination. ? Vision changes. ? Confusion or irritability. ? Nausea. ? Vomiting. ? Rapid breathing. ? Pain in the abdomen. ? Feeling flushed.  You are unable to drink fluids without vomiting.  You have any of the following for more than 6 hours: ? Nausea. ? Vomiting. ? Diarrhea.  Your blood glucose is at or above 240 mg/dL (13.3 mmol/L), even after you take an additional insulin dose.  You have a change in how you think, feel, or act (mental status).  You develop another serious illness.  You have been sick or have had a fever for 2 days or longer and you are not getting better. Get help right away if:  Your blood glucose is lower than 54 mg/dL (3.0 mmol/L).  You have difficulty breathing.  You have moderate or high ketone levels in your urine.  You used emergency glucagon to treat low blood glucose. Summary  Blood sugar (glucose) can be difficult to control when you are sick. Common illnesses that can cause problems for people with diabetes (diabetes mellitus) include colds, fever, flu (influenza), nausea, vomiting, and diarrhea.  Illnesses can cause stress and loss of body fluids (dehydration), and those issues can cause blood glucose levels to increase.  Make a plan for days when you are sick (sick day plan) as part of your diabetes management plan. You and your health care provider should make this plan in advance.  It is very important to take your insulin and diabetes medicines and to eat some form of carbohydrate when you are sick.  Contact your health care provider if have problems managing your blood glucose levels when you are sick, or if you have been sick or had a fever for 2 days or longer and are  not getting better. This information is not intended to replace advice given to you by your health care provider. Make sure you discuss any questions you have with your health care provider. Document Released: 05/13/2003 Document Revised: 02/06/2016 Document Reviewed: 02/06/2016 Elsevier Interactive Patient Education  2018 [employer]  

## 2017-05-30 NOTE — Progress Notes (Signed)
Subjective:  Patient ID: Sarah Charles, female    DOB: 07-Oct-1960  Age: 57 y.o. MRN: [id number]  CC: Diabetes   HPI Sarah Charles presents for [facility] follow up. PMH of diabetes, htn/hld, and depression. She reports not taking her medication for 3 days.  She reports history of nausea, vomiting ( x 2 emesis)  and diarrheas over the past weekend after eating at a restaurant. She still c/o nausea. History of diabetic neuropathy controlled on gabapentin. Patient denies foot ulcerations, nausea visual disturbances and vomiting.  Evaluation to date has been included: fasting blood sugar, fasting lipid panel, hemoglobin A1C and microalbuminuria.  Home sugars: patient does not check sugars. Treatment to date: Insulin, metformin, and  DDP 4 inhibitor. She reports walking more. She does not bring her glucometer or list of blood glucose with her in office. History of depression. She does report adherence with Wellbutrin. She denies current suicidal and homicidal plan or intent. She declines speaking with LCSW.    Outpatient Medications Prior to Visit  Medication Sig Dispense Refill  . ibuprofen (ADVIL,MOTRIN) 600 MG tablet Take 1 tablet (600 mg total) by mouth every 6 (six) hours as needed. (Patient not taking: Reported on 12/01/2016) 30 tablet 0  . atorvastatin (LIPITOR) 40 MG tablet Take 1 tablet (40 mg total) by mouth daily. 90 tablet 0  . Blood Glucose Monitoring Suppl (TRUE METRIX METER) w/Device KIT Use as directed 1 kit 0  . buPROPion (WELLBUTRIN SR) 150 MG 12 hr tablet Take 1 tablet (150 mg total) by mouth 2 (two) times daily. 180 tablet 0  . gabapentin (NEURONTIN) 300 MG capsule Take 1 capsule (300 mg total) by mouth at bedtime. 30 capsule 2  . glucose blood (TRUE METRIX BLOOD GLUCOSE TEST) test strip Use as instructed 100 each 12  . Insulin Glargine (LANTUS SOLOSTAR) 100 UNIT/ML Solostar Pen Inject 25 Units into the skin at bedtime. 6 mL 0  . lisinopril (PRINIVIL,ZESTRIL) 20 MG tablet Take 1 tablet (20  mg total) by mouth daily. 90 tablet 0  . omeprazole (PRILOSEC) 20 MG capsule Take 1 capsule (20 mg total) by mouth daily. 90 capsule 0  . sitaGLIPtin-metformin (JANUMET) 50-1000 MG tablet Take 1 tablet by mouth 2 (two) times daily with a meal. 180 tablet 0  . TRUEPLUS LANCETS 28G MISC Use as directed 100 each 12  . bacitracin ointment 1 application      No facility-administered medications prior to visit.     Review of Systems  Constitutional: Negative.   Eyes: Negative.   Respiratory: Negative.   Cardiovascular: Negative.   Gastrointestinal: Negative.   Skin: Negative.   Neurological: Negative.   Psychiatric/Behavioral: Negative for suicidal ideas. Depression: history of depression.    Objective:  BP (!) 159/80 (BP Location: Left Arm, Patient Position: Sitting, Cuff Size: Large)   Pulse 74   Temp (!) 97.5 F (36.4 C) (Oral)   Resp 18   Ht 5' 2" (1.575 m)   Wt 174 lb (78.9 kg)   SpO2 98%   BMI 31.83 kg/m   BP/Weight 05/30/2017 01/19/2017 4/70/9628  Systolic BP 366 294 765  Diastolic BP 80 99 81  Wt. (Lbs) 174 - 171.6  BMI 31.83 - 30.4   Physical Exam  Nursing note and vitals reviewed. Constitutional: She appears well-developed and well-nourished.  Eyes: Conjunctivae are normal. Pupils are equal, round, and reactive to light.  Cardiovascular: Normal rate, regular rhythm, normal heart sounds and intact distal pulses.  Respiratory: Effort normal and breath sounds  normal.  GI: Soft. Bowel sounds are normal.  Skin: Skin is warm and dry.  Psychiatric: Her affect is blunt. She expresses no homicidal and no suicidal ideation. She expresses no suicidal plans and no homicidal plans. She is communicative. She is attentive.    Assessment & Plan:   1. Uncontrolled type 2 diabetes mellitus with complication, with long-term current use of insulin ([place]) Start checking CBG's . Bring glucometer or blood glucose log to next office visit. Follow up with clinical pharmacist in 2  weeks. Follow up with PCP in 3 months. - HgB A1c - Glucose (CBG) - Insulin Glargine (LANTUS SOLOSTAR) 100 UNIT/ML Solostar Pen; Inject 28 Units into the skin at bedtime.  Dispense: 15 mL; Refill: 2 - sitaGLIPtin-metformin (JANUMET) 50-1000 MG tablet; Take 1 tablet by mouth 2 (two) times daily with a meal.  Dispense: 180 tablet; Refill: 0 - CMP and Liver - Lipid Panel - lisinopril (PRINIVIL,ZESTRIL) 20 MG tablet; Take 1 tablet (20 mg total) by mouth daily.  Dispense: 90 tablet; Refill: 0 - TRUEPLUS LANCETS 28G MISC; Use as directed  Dispense: 100 each; Refill: 12 - Ambulatory referral to Ophthalmology - Urinalysis Dipstick  2. Diabetic polyneuropathy associated with type 2 diabetes mellitus (HCC)   - gabapentin (NEURONTIN) 300 MG capsule; Take 1 capsule (300 mg total) by mouth 2 (two) times daily.  Dispense: 60 capsule; Refill: 2  3. Depression, unspecified depression type   - buPROPion (WELLBUTRIN SR) 150 MG 12 hr tablet; Take 1 tablet (150 mg total) by mouth 2 (two) times daily.  Dispense: 180 tablet; Refill: 0  4. Mixed dyslipidemia   - CMP and Liver - Lipid Panel  5. Gastroesophageal reflux disease, esophagitis presence not specified   - omeprazole (PRILOSEC) 20 MG capsule; Take 1 capsule (20 mg total) by mouth daily.  Dispense: 90 capsule; Refill: 0  6. Essential hypertension   - lisinopril (PRINIVIL,ZESTRIL) 20 MG tablet; Take 1 tablet (20 mg total) by mouth daily.  Dispense: 90 tablet; Refill: 0  7. Nausea   - metoCLOPramide (REGLAN) 10 MG tablet; Take 1 tablet (10 mg total) by mouth every 8 (eight) hours as needed for nausea or vomiting.  Dispense: 40 tablet; Refill: 0    Follow-up: Return in about 2 weeks (around 12/01/2016) for DM with clinical pharmacist.  [name] FNP

## 2017-05-31 LAB — CMP AND LIVER
ALT: 13 IU/L (ref 0–32)
AST: 15 IU/L (ref 0–40)
Albumin: 4.1 g/dL (ref 3.5–5.5)
Alkaline Phosphatase: 124 IU/L — ABNORMAL HIGH (ref 39–117)
BUN: 14 mg/dL (ref 6–24)
Bilirubin Total: 0.3 mg/dL (ref 0.0–1.2)
Bilirubin, Direct: 0.1 mg/dL (ref 0.00–0.40)
CO2: 26 mmol/L (ref 20–29)
Calcium: 9.8 mg/dL (ref 8.7–10.2)
Chloride: 95 mmol/L — ABNORMAL LOW (ref 96–106)
Creatinine, Ser: 0.49 mg/dL — ABNORMAL LOW (ref 0.57–1.00)
GFR calc Af Amer: 126 mL/min/{1.73_m2} (ref >59)
GFR calc non Af Amer: 109 mL/min/{1.73_m2} (ref >59)
Glucose: 363 mg/dL — ABNORMAL HIGH (ref 65–99)
Potassium: 3.7 mmol/L (ref 3.5–5.2)
Sodium: 138 mmol/L (ref 134–144)
Total Protein: 6.8 g/dL (ref 6.0–8.5)

## 2017-05-31 LAB — LIPID PANEL
Chol/HDL Ratio: 6.7 ratio — ABNORMAL HIGH (ref 0.0–4.4)
Cholesterol, Total: 222 mg/dL — ABNORMAL HIGH (ref 100–199)
HDL: 33 mg/dL — ABNORMAL LOW (ref >39)
Triglycerides: 518 mg/dL — ABNORMAL HIGH (ref 0–149)

## 2017-06-01 ENCOUNTER — Other Ambulatory Visit: Payer: Self-pay | Admitting: Family Medicine

## 2017-06-01 DIAGNOSIS — E782 Mixed hyperlipidemia: Secondary | ICD-10-CM

## 2017-06-01 MED ORDER — OMEGA-3-ACID ETHYL ESTERS 1 G PO CAPS: 1.0000 g | ORAL_CAPSULE | Freq: Two times a day (BID) | ORAL | 0 refills | Status: DC

## 2017-06-01 MED ORDER — ATORVASTATIN CALCIUM 40 MG PO TABS
40.0000 mg | ORAL_TABLET | Freq: Every day | ORAL | 0 refills | Status: DC
Start: 2017-06-01 — End: 2017-08-15

## 2017-06-02 ENCOUNTER — Telehealth: Payer: Self-pay | Admitting: *Deleted

## 2017-06-02 ENCOUNTER — Other Ambulatory Visit: Payer: Self-pay | Admitting: *Deleted

## 2017-06-02 MED ORDER — ACCU-CHEK AVIVA PLUS W/DEVICE KIT: 1.0000 | PACK | Freq: Three times a day (TID) | 0 refills | Status: DC

## 2017-06-02 MED ORDER — GLUCOSE BLOOD VI STRP: ORAL_STRIP | 12 refills | Status: DC

## 2017-06-02 MED ORDER — ACCU-CHEK SOFTCLIX LANCET DEV MISC: 12 refills | Status: DC

## 2017-06-02 NOTE — Telephone Encounter (Signed)
Sarah Charles, [place] R, FNP  [place], [place] K, CMA        Lipid levels were elevated. This can increase your risk of heart disease overtime. You will be prescribed lovaza. Take atorvastatin daily. Recommend recheck in 3 months.  Liver function normal  Kidney function normal  Labs normal.    NO answer. Attempt to call pt to give results.

## 2017-06-06 ENCOUNTER — Telehealth: Payer: Self-pay | Admitting: *Deleted

## 2017-06-06 NOTE — Telephone Encounter (Signed)
Mailed letter to patient address to call office.

## 2017-06-06 NOTE — Telephone Encounter (Signed)
Attempt to call patient to inform of lab results. No answer.

## 2017-06-21 ENCOUNTER — Ambulatory Visit: Payer: Medicaid Other | Admitting: Pharmacist

## 2017-06-28 ENCOUNTER — Encounter: Payer: Self-pay | Admitting: Family Medicine

## 2017-06-28 ENCOUNTER — Ambulatory Visit: Payer: Medicaid Other | Attending: Family Medicine | Admitting: Family Medicine

## 2017-06-28 DIAGNOSIS — E1165 Type 2 diabetes mellitus with hyperglycemia: Secondary | ICD-10-CM | POA: Diagnosis present

## 2017-06-28 DIAGNOSIS — E7800 Pure hypercholesterolemia, unspecified: Secondary | ICD-10-CM | POA: Diagnosis not present

## 2017-06-28 DIAGNOSIS — E118 Type 2 diabetes mellitus with unspecified complications: Secondary | ICD-10-CM

## 2017-06-28 DIAGNOSIS — B373 Candidiasis of vulva and vagina: Secondary | ICD-10-CM | POA: Diagnosis not present

## 2017-06-28 DIAGNOSIS — G4730 Sleep apnea, unspecified: Secondary | ICD-10-CM | POA: Diagnosis not present

## 2017-06-28 DIAGNOSIS — I10 Essential (primary) hypertension: Secondary | ICD-10-CM | POA: Diagnosis not present

## 2017-06-28 DIAGNOSIS — F329 Major depressive disorder, single episode, unspecified: Secondary | ICD-10-CM | POA: Diagnosis not present

## 2017-06-28 DIAGNOSIS — K219 Gastro-esophageal reflux disease without esophagitis: Secondary | ICD-10-CM | POA: Diagnosis not present

## 2017-06-28 DIAGNOSIS — Z79899 Other long term (current) drug therapy: Secondary | ICD-10-CM | POA: Insufficient documentation

## 2017-06-28 DIAGNOSIS — B3731 Acute candidiasis of vulva and vagina: Secondary | ICD-10-CM

## 2017-06-28 DIAGNOSIS — J329 Chronic sinusitis, unspecified: Secondary | ICD-10-CM | POA: Diagnosis not present

## 2017-06-28 DIAGNOSIS — IMO0002 Reserved for concepts with insufficient information to code with codable children: Secondary | ICD-10-CM

## 2017-06-28 DIAGNOSIS — J328 Other chronic sinusitis: Secondary | ICD-10-CM

## 2017-06-28 DIAGNOSIS — Z794 Long term (current) use of insulin: Secondary | ICD-10-CM | POA: Insufficient documentation

## 2017-06-28 DIAGNOSIS — R432 Parageusia: Secondary | ICD-10-CM | POA: Diagnosis not present

## 2017-06-28 LAB — GLUCOSE, POCT (MANUAL RESULT ENTRY): POC Glucose: 385 mg/dl — AB (ref 70–99)

## 2017-06-28 MED ORDER — FLUTICASONE PROPIONATE 50 MCG/ACT NA SUSP: 2.0000 | Freq: Every day | NASAL | 1 refills | Status: DC

## 2017-06-28 MED ORDER — CETIRIZINE HCL 10 MG PO TABS: 10.0000 mg | ORAL_TABLET | Freq: Every day | ORAL | 1 refills | Status: DC

## 2017-06-28 MED ORDER — FLUCONAZOLE 150 MG PO TABS: 150.0000 mg | ORAL_TABLET | Freq: Once | ORAL | 0 refills | Status: AC

## 2017-06-28 MED ORDER — INSULIN GLARGINE 100 UNIT/ML SOLOSTAR PEN: 35.0000 [IU] | PEN_INJECTOR | Freq: Every day | SUBCUTANEOUS | 2 refills | Status: DC

## 2017-06-28 MED FILL — FLUCONAZOLE 150 MG TABLET: 150 | 1 days supply | Qty: 1 | Fill #0

## 2017-06-28 MED FILL — LANTUS SOLOSTAR 100 UNITS/M: 100 | 25 days supply | Qty: 9 | Fill #0

## 2017-06-28 MED FILL — ALL DAY ALLERGY 10 MG TAB: 10 | 30 days supply | Qty: 30 | Fill #0

## 2017-06-28 NOTE — Progress Notes (Signed)
    S:     No chief complaint on file.   Patient arrives in good spirits.  Presents for diabetes evaluation, education, and management.  Patient reports adherence with medications.  Current diabetes medications include: Lantus 28 units daily, Janumet 50-1000 mg BID Current hypertension medications include: lisinopril 20 mg daily   She reports eating [place] food for lunch.  Patient denies hypoglycemic events.   Patient reports nocturia.  Patient reports neuropathy. Patient reports visual changes. Patient reports self foot exams.   She is concerned about having a dry vagina. She has used creams but they don't help and she isn't able to enjoy sex as a newlywed.    O:  Physical Exam   ROS   Lab Results  Component Value Date   HGBA1C 10.8 05/30/2017   There were no vitals filed for this visit.  Home fasting CBG: 300s-400s  2 hour post-prandial/random CBG: 260s-500s  7 day CBG average 450  POCT glucose = 385  A/P: Diabetes longstanding currently uncontrolled based on A1c of 10.8. Patient denies hypoglycemic events and is able to verbalize appropriate hypoglycemia management plan. Patient reports adherence with medication. Control is suboptimal due to dietary indiscretion, sedentary lifestyle, and nonadherence to medications.  Current POCT glucose = 385, will be moved to Dr. Baxter FlatteryNewlin's schedule for further management.

## 2017-06-28 NOTE — Progress Notes (Signed)
Subjective:  Patient ID: Sarah Charles, female    DOB: 1960-11-23  Age: 57 y.o. MRN: [id number]  CC: Hyperglycemia  HPI Sarah Charles is a 57 year old female with a history of type 2 diabetes mellitus (A1c 10.7), hypertension followed by the nurse practitioner  who was seen today for a visit with a clinical pharmacist and found to have elevated blood sugar of 385.  Her last meal was 4 hours ago when she ate [place] food. She endorses compliance with 28 units of Lantus and her oral medications. Denies excessive thirst, excessive urination. Reviewed her glucometer and her 7-day average has been 459.  She complains of vaginal itching which causes "rawness" and associated whitish discharge; denies urinary symptoms.  She complains of abnormal sense of taste and smell ever since she had a sinus infection 2 months ago.  Denies visual complaints, headaches. Currently denies sinus tenderness or pressure has no fever.  Past Medical History:  Diagnosis Date  . Arthritis   . Depression   . Diabetes mellitus   . GERD (gastroesophageal reflux disease)   . Hypercholesterolemia   . Hyperlipidemia   . Hypertension   . Sleep apnea    pt sts does not use "cause i dont think I need it anymore".    Past Surgical History:  Procedure Laterality Date  . CHOLECYSTECTOMY    . INCISION AND DRAINAGE ABSCESS Left 12/22/2012   Procedure: INCISION AND DRAINAGE ABSCESS;  Surgeon: Jamesetta So, MD;  Location: [facility];  Service: General;  Laterality: Left;  . TONSILLECTOMY    . TUBAL LIGATION      Allergies  Allergen Reactions  . Cymbalta [Duloxetine Hcl] Nausea Only    Nausea, lack of therapeutic effect     Outpatient Medications Prior to Visit  Medication Sig Dispense Refill  . atorvastatin (LIPITOR) 40 MG tablet Take 1 tablet (40 mg total) by mouth daily. 90 tablet 0  . Blood Glucose Monitoring Suppl (ACCU-CHEK AVIVA PLUS) w/Device KIT 1 each by Does not apply route 3 (three) times daily. 1 kit 0  .  buPROPion (WELLBUTRIN SR) 150 MG 12 hr tablet Take 1 tablet (150 mg total) by mouth 2 (two) times daily. 180 tablet 0  . gabapentin (NEURONTIN) 300 MG capsule Take 1 capsule (300 mg total) by mouth 2 (two) times daily. 60 capsule 2  . glucose blood (ACCU-CHEK AVIVA) test strip Use as instructed 100 each 12  . ibuprofen (ADVIL,MOTRIN) 600 MG tablet Take 1 tablet (600 mg total) by mouth every 6 (six) hours as needed. (Patient not taking: Reported on 12/01/2016) 30 tablet 0  . Lancet Devices (ACCU-CHEK SOFTCLIX) lancets Use as instructed 1 each 12  . lisinopril (PRINIVIL,ZESTRIL) 20 MG tablet Take 1 tablet (20 mg total) by mouth daily. 90 tablet 0  . metoCLOPramide (REGLAN) 10 MG tablet Take 1 tablet (10 mg total) by mouth every 8 (eight) hours as needed for nausea or vomiting. 40 tablet 0  . omega-3 acid ethyl esters (LOVAZA) 1 g capsule Take 1 capsule (1 g total) by mouth 2 (two) times daily. 180 capsule 0  . omeprazole (PRILOSEC) 20 MG capsule Take 1 capsule (20 mg total) by mouth daily. 90 capsule 0  . sitaGLIPtin-metformin (JANUMET) 50-1000 MG tablet Take 1 tablet by mouth 2 (two) times daily with a meal. 180 tablet 0  . TRUEPLUS LANCETS 28G MISC Use as directed 100 each 12  . Insulin Glargine (LANTUS SOLOSTAR) 100 UNIT/ML Solostar Pen Inject 28 Units into the skin at  bedtime. 15 mL 2   No facility-administered medications prior to visit.     ROS Review of Systems  Constitutional: Negative for activity change, appetite change and fatigue.  HENT: Negative for congestion, sinus pressure and sore throat.   Eyes: Negative for visual disturbance.  Respiratory: Negative for cough, chest tightness, shortness of breath and wheezing.   Cardiovascular: Negative for chest pain and palpitations.  Gastrointestinal: Negative for abdominal distention, abdominal pain and constipation.  Endocrine: Negative for polydipsia.  Genitourinary: Positive for vaginal discharge. Negative for dysuria and frequency.    Musculoskeletal: Negative for arthralgias and back pain.  Skin: Negative for rash.  Neurological: Negative for tremors, light-headedness and numbness.  Hematological: Does not bruise/bleed easily.  Psychiatric/Behavioral: Negative for agitation and behavioral problems.    Objective:  There were no vitals taken for this visit.  BP/Weight 05/30/2017 01/19/2017 8/88/2800  Systolic BP 349 179 150  Diastolic BP 80 99 81  Wt. (Lbs) 174 - 171.6  BMI 31.83 - 30.4      Physical Exam  Constitutional: She is oriented to person, place, and time. She appears well-developed and well-nourished.  Cardiovascular: Normal rate, normal heart sounds and intact distal pulses.  No murmur heard. Pulmonary/Chest: Effort normal and breath sounds normal. She has no wheezes. She has no rales. She exhibits no tenderness.  Abdominal: Soft. Bowel sounds are normal. She exhibits no distension and no mass. There is no tenderness.  Musculoskeletal: Normal range of motion.  Neurological: She is alert and oriented to person, place, and time.  Skin: Skin is warm and dry.  Psychiatric: She has a normal mood and affect.     Lab Results  Component Value Date   HGBA1C 10.8 05/30/2017    Assessment & Plan:   1. Uncontrolled type 2 diabetes mellitus with complication, with long-term current use of insulin (HCC) Uncontrolled with A1c of 10.8, CBG of 385 which decreased to 320 Increase Lantus from 28 units to 35 units Counseled on Diabetic diet, my plate method, 569 minutes of moderate intensity exercise/week Keep blood sugar logs with fasting goals of 80-120 mg/dl, random of less than 180 and in the event of sugars less than 60 mg/dl or greater than 400 mg/dl please notify the clinic ASAP. It is recommended that you undergo annual eye exams and annual foot exams. Pneumonia vaccine is recommended. - Glucose (CBG) - Insulin Glargine (LANTUS SOLOSTAR) 100 UNIT/ML Solostar Pen; Inject 35 Units into the skin at  bedtime.  Dispense: 15 mL; Refill: 2  2. Vaginal candidiasis Likely due to hyperglycemia which puts her at risk for candidiasis - fluticasone (FLONASE) 50 MCG/ACT nasal spray; Place 2 sprays into both nostrils daily.  Dispense: 16 g; Refill: 1  3. Other chronic sinusitis Could explain altered sense of taste and smell - fluconazole (DIFLUCAN) 150 MG tablet; Take 1 tablet (150 mg total) by mouth once for 1 dose.  Dispense: 1 tablet; Refill: 0 - cetirizine (ZYRTEC) 10 MG tablet; Take 1 tablet (10 mg total) by mouth daily.  Dispense: 30 tablet; Refill: 1  4. Dysgeusia We will treat for chronic sinusitis and reevaluate   Meds ordered this encounter  Medications  . Insulin Glargine (LANTUS SOLOSTAR) 100 UNIT/ML Solostar Pen    Sig: Inject 35 Units into the skin at bedtime.    Dispense:  15 mL    Refill:  2    Discontinue previous dose  . fluconazole (DIFLUCAN) 150 MG tablet    Sig: Take 1 tablet (150 mg total)  by mouth once for 1 dose.    Dispense:  1 tablet    Refill:  0  . cetirizine (ZYRTEC) 10 MG tablet    Sig: Take 1 tablet (10 mg total) by mouth daily.    Dispense:  30 tablet    Refill:  1  . fluticasone (FLONASE) 50 MCG/ACT nasal spray    Sig: Place 2 sprays into both nostrils daily.    Dispense:  16 g    Refill:  1    Follow-up: Return in about 6 weeks (around 08/09/2017) for Follow-up of diabetes mellitus.   Charlott Rakes MD

## 2017-07-26 ENCOUNTER — Encounter (HOSPITAL_COMMUNITY): Payer: Self-pay | Admitting: Emergency Medicine

## 2017-07-26 ENCOUNTER — Emergency Department (HOSPITAL_COMMUNITY)
Admission: EM | Admit: 2017-07-26 | Discharge: 2017-07-26 | Disposition: A | Discharge status: Home / Self Care | Payer: Medicaid Other | Attending: Emergency Medicine | Admitting: Emergency Medicine

## 2017-07-26 ENCOUNTER — Emergency Department (HOSPITAL_COMMUNITY)
Admission: EM | Admit: 2017-07-26 | Discharge: 2017-07-27 | Disposition: A | Discharge status: Home / Self Care | Payer: Medicaid Other | Source: Home / Self Care | Attending: Emergency Medicine | Admitting: Emergency Medicine

## 2017-07-26 ENCOUNTER — Emergency Department (HOSPITAL_COMMUNITY): Payer: Medicaid Other

## 2017-07-26 ENCOUNTER — Other Ambulatory Visit: Payer: Self-pay

## 2017-07-26 DIAGNOSIS — Z79899 Other long term (current) drug therapy: Secondary | ICD-10-CM

## 2017-07-26 DIAGNOSIS — M5431 Sciatica, right side: Secondary | ICD-10-CM

## 2017-07-26 DIAGNOSIS — I10 Essential (primary) hypertension: Secondary | ICD-10-CM

## 2017-07-26 DIAGNOSIS — R1031 Right lower quadrant pain: Secondary | ICD-10-CM | POA: Insufficient documentation

## 2017-07-26 DIAGNOSIS — Z9049 Acquired absence of other specified parts of digestive tract: Secondary | ICD-10-CM | POA: Insufficient documentation

## 2017-07-26 DIAGNOSIS — Z794 Long term (current) use of insulin: Secondary | ICD-10-CM

## 2017-07-26 DIAGNOSIS — E119 Type 2 diabetes mellitus without complications: Secondary | ICD-10-CM

## 2017-07-26 DIAGNOSIS — Z5321 Procedure and treatment not carried out due to patient leaving prior to being seen by health care provider: Secondary | ICD-10-CM | POA: Diagnosis not present

## 2017-07-26 DIAGNOSIS — F17210 Nicotine dependence, cigarettes, uncomplicated: Secondary | ICD-10-CM

## 2017-07-26 DIAGNOSIS — R109 Unspecified abdominal pain: Secondary | ICD-10-CM | POA: Insufficient documentation

## 2017-07-26 DIAGNOSIS — F329 Major depressive disorder, single episode, unspecified: Secondary | ICD-10-CM | POA: Insufficient documentation

## 2017-07-26 LAB — CBC WITH DIFFERENTIAL/PLATELET
Basophils Absolute: 0 10*3/uL (ref 0.0–0.1)
Basophils Relative: 1 %
Eosinophils Absolute: 0.1 10*3/uL (ref 0.0–0.7)
Eosinophils Relative: 1 %
HCT: 40.3 % (ref 36.0–46.0)
Hemoglobin: 13.6 g/dL (ref 12.0–15.0)
Lymphocytes Relative: 42 %
Lymphs Abs: 2.8 10*3/uL (ref 0.7–4.0)
MCH: 30.6 pg (ref 26.0–34.0)
MCHC: 33.7 g/dL (ref 30.0–36.0)
MCV: 90.8 fL (ref 78.0–100.0)
Monocytes Absolute: 0.4 10*3/uL (ref 0.1–1.0)
Monocytes Relative: 5 %
Neutro Abs: 3.4 10*3/uL (ref 1.7–7.7)
Neutrophils Relative %: 51 %
Platelets: 227 10*3/uL (ref 150–400)
RBC: 4.44 MIL/uL (ref 3.87–5.11)
RDW: 13.1 % (ref 11.5–15.5)
WBC: 6.6 10*3/uL (ref 4.0–10.5)

## 2017-07-26 LAB — COMPREHENSIVE METABOLIC PANEL
ALT: 10 U/L — ABNORMAL LOW (ref 14–54)
AST: 16 U/L (ref 15–41)
Albumin: 3.3 g/dL — ABNORMAL LOW (ref 3.5–5.0)
Alkaline Phosphatase: 122 U/L (ref 38–126)
Anion gap: 12 (ref 5–15)
BUN: 15 mg/dL (ref 6–20)
CO2: 24 mmol/L (ref 22–32)
Calcium: 9.2 mg/dL (ref 8.9–10.3)
Chloride: 98 mmol/L — ABNORMAL LOW (ref 101–111)
Creatinine, Ser: 0.53 mg/dL (ref 0.44–1.00)
GFR calc Af Amer: >60 mL/min (ref >60)
GFR calc non Af Amer: >60 mL/min (ref >60)
Glucose, Bld: 303 mg/dL — ABNORMAL HIGH (ref 65–99)
Potassium: 3.4 mmol/L — ABNORMAL LOW (ref 3.5–5.1)
Sodium: 134 mmol/L — ABNORMAL LOW (ref 135–145)
Total Bilirubin: 0.4 mg/dL (ref 0.3–1.2)
Total Protein: 6.7 g/dL (ref 6.5–8.1)

## 2017-07-26 LAB — LIPASE, BLOOD: Lipase: 33 U/L (ref 11–51)

## 2017-07-26 MED ORDER — MORPHINE SULFATE (PF) 4 MG/ML IV SOLN
4.0000 mg | Freq: Once | INTRAVENOUS | Status: AC
Start: 2017-07-26 — End: 2017-07-26
  Administered 2017-07-26: 4 mg via INTRAVENOUS
  Filled 2017-07-26: qty 1

## 2017-07-26 MED ORDER — ONDANSETRON HCL 4 MG/2ML IJ SOLN
4.0000 mg | Freq: Once | INTRAMUSCULAR | Status: AC
  Administered 2017-07-26: 4 mg via INTRAVENOUS
  Filled 2017-07-26: qty 2

## 2017-07-26 MED ORDER — IOPAMIDOL (ISOVUE-300) INJECTION 61%
100.0000 mL | Freq: Once | INTRAVENOUS | Status: AC | PRN
  Administered 2017-07-27: 100 mL via INTRAVENOUS

## 2017-07-26 NOTE — ED Provider Notes (Signed)
[facility] EMERGENCY DEPARTMENT Provider Note   CSN: [id number] Arrival date & time: 07/26/17  2150     History   Chief Complaint Chief Complaint  Patient presents with  . Abdominal Pain    HPI Ren Grasse is a 57 y.o. female.  HPI   Tayleigh Wetherell is a 57 y.o. female with poorly controlled diabetes mellitus, presents to the Emergency Department complaining of right lower abdominal pain for nearly one week, worse for 2 days.  She describes the pain as constant and dull and radiates from her right lower abdomen into the right thigh and intermittently down the right calf.  Pain is worse with walking and sitting.  She states at times it feels as though her leg is "giving way" She has been taking ibuprofen with mild relief.  No known injury.  Pain is not affected by food intake.  She denies fever, vomiting, dysuria or incontinence, diarrhea or other bowel changes, extremity numbness or weakness, nausea or vomiting.   Past Medical History:  Diagnosis Date  . Arthritis   . Depression   . Diabetes mellitus   . GERD (gastroesophageal reflux disease)   . Hypercholesterolemia   . Hyperlipidemia   . Hypertension   . Sleep apnea    pt sts does not use "cause i dont think I need it anymore".    Patient Active Problem List   Diagnosis Date Noted  . Diabetic polyneuropathy associated with type 2 diabetes mellitus ([place]) 05/30/2017  . Mixed dyslipidemia 05/30/2017  . Depression 05/31/2016  . Hypertension 05/31/2016  . Uncontrolled type 2 diabetes mellitus with complication ([place]) 20/25/4270  . Uncontrolled type 2 diabetes mellitus with complication, with long-term current use of insulin ([place]) 04/20/2015  . Cellulitis of breast 12/29/2013  . Diabetes mellitus ([place]) 12/29/2013  . Cellulitis of female breast 12/29/2013  . Symptomatic menopausal or female climacteric states 04/08/2008  . OTHER SPECIFIED DISEASE OF NAIL 04/08/2008  . URINARY URGENCY 04/08/2008  . COUGH 04/02/2008  .  HYPERTENSION, BENIGN ESSENTIAL 04/12/2007  . FATIGUE 04/12/2007  . OBESITY, NOS 07/21/2006  . Major depressive disorder, recurrent episode ([place]) 07/21/2006  . TOBACCO DEPENDENCE 07/21/2006  . GASTROESOPHAGEAL REFLUX, NO ESOPHAGITIS 07/21/2006  . INCONTINENCE, STRESS, FEMALE 07/21/2006    Past Surgical History:  Procedure Laterality Date  . CHOLECYSTECTOMY    . INCISION AND DRAINAGE ABSCESS Left 12/22/2012   Procedure: INCISION AND DRAINAGE ABSCESS;  Surgeon: Jamesetta So, MD;  Location: [facility];  Service: General;  Laterality: Left;  . TONSILLECTOMY    . TUBAL LIGATION      OB History    No data available       Home Medications    Prior to Admission medications   Medication Sig Start Date End Date Taking? Authorizing Provider  atorvastatin (LIPITOR) 40 MG tablet Take 1 tablet (40 mg total) by mouth daily. 06/01/17  Yes Hairston, Maylon Peppers, FNP  buPROPion (WELLBUTRIN SR) 150 MG 12 hr tablet Take 1 tablet (150 mg total) by mouth 2 (two) times daily. 05/30/17  Yes Hairston, Maylon Peppers, FNP  cetirizine (ZYRTEC) 10 MG tablet Take 1 tablet (10 mg total) by mouth daily. 06/28/17  Yes Charlott Rakes, MD  gabapentin (NEURONTIN) 300 MG capsule Take 1 capsule (300 mg total) by mouth 2 (two) times daily. 05/30/17  Yes Hairston, Maylon Peppers, FNP  Insulin Glargine (LANTUS SOLOSTAR) 100 UNIT/ML Solostar Pen Inject 35 Units into the skin at bedtime. 06/28/17  Yes Charlott Rakes, MD  lisinopril (PRINIVIL,ZESTRIL) 20  MG tablet Take 1 tablet (20 mg total) by mouth daily. 05/30/17  Yes Hairston, Maylon Peppers, FNP  metoCLOPramide (REGLAN) 10 MG tablet Take 1 tablet (10 mg total) by mouth every 8 (eight) hours as needed for nausea or vomiting. 05/30/17  Yes Hairston, Maylon Peppers, FNP  omega-3 acid ethyl esters (LOVAZA) 1 g capsule Take 1 capsule (1 g total) by mouth 2 (two) times daily. 06/01/17  Yes Hairston, Maylon Peppers, FNP  Omega-3 Fatty Acids (FISH OIL PO) Take 1 capsule by mouth 2 (two) times daily.   Yes [provider]  sitaGLIPtin-metformin (JANUMET) 50-1000 MG tablet Take 1 tablet by mouth 2 (two) times daily with a meal. 05/30/17  Yes Hairston, Mandesia R, FNP  Blood Glucose Monitoring Suppl (ACCU-CHEK AVIVA PLUS) w/Device KIT 1 each by Does not apply route 3 (three) times daily. 06/02/17   [name], FNP  glucose blood (ACCU-CHEK AVIVA) test strip Use as instructed 06/02/17   [name], FNP  Lancet Devices [facility]) lancets Use as instructed 06/02/17   [name], FNP  omeprazole (PRILOSEC) 20 MG capsule Take 1 capsule (20 mg total) by mouth daily. Patient not taking: Reported on 07/26/2017 05/30/17   [name], FNP  TRUEPLUS LANCETS 28G MISC Use as directed 05/30/17   [name], FNP    Family History Family History  Problem Relation Age of Onset  . Hypertension Mother   . CVA Mother   . COPD Mother   . Heart disease Mother   . Kidney Stones Mother   . CAD Father   . Hypercholesterolemia Father   . Heart attack Father   . Cancer Maternal Uncle   . Diabetes Maternal Grandmother   . Cancer Maternal Grandfather        lung cancer    Social History Social History   Tobacco Use  . Smoking status: Current Every Day Smoker    Packs/day: 0.50    Years: 41.00    Pack years: 20.50    Types: Cigarettes  . Smokeless tobacco: Never Used  Substance Use Topics  . Alcohol use: No  . Drug use: No     Allergies   Cymbalta [duloxetine hcl]   Review of Systems Review of Systems  Constitutional: Negative for appetite change, chills and fever.  Respiratory: Negative for shortness of breath.   Cardiovascular: Negative for chest pain.  Gastrointestinal: Positive for abdominal pain. Negative for blood in stool, constipation, diarrhea, nausea and vomiting.  Genitourinary: Negative for decreased urine volume, difficulty urinating, dysuria, flank pain and frequency.  Musculoskeletal: Positive for arthralgias (right hip pain).  Negative for back pain.  Skin: Negative for color change and rash.  Neurological: Negative for dizziness, weakness and numbness.  Hematological: Negative for adenopathy.  All other systems reviewed and are negative.    Physical Exam Updated Vital Signs BP (!) 151/78   Pulse 69   Temp 97.9 F (36.6 C) (Oral)   Resp 17   SpO2 97%   Physical Exam  Constitutional: She is oriented to person, place, and time. She appears well-developed and well-nourished. No distress.  HENT:  Head: Normocephalic and atraumatic.  Mouth/Throat: Oropharynx is clear and moist.  Cardiovascular: Normal rate, regular rhythm and intact distal pulses.  No murmur heard. Pulmonary/Chest: Effort normal and breath sounds normal. No respiratory distress.  Abdominal: Soft. Normal appearance and bowel sounds are normal. She exhibits no distension and no mass. There is tenderness. There is no rigidity, no rebound,  no guarding and no CVA tenderness.  Mild ttp of the RLQ.  No guarding or rebound tenderness. Negative Obturator sign  Musculoskeletal: Normal range of motion. She exhibits no edema.  ttp of the lower lumbar spine, right paraspinal muscles and SI joint space.  Positive SLR on right at 30 degrees.  5/5 motor strength of BLE's  Neurological: She is alert and oriented to person, place, and time. She exhibits normal muscle tone. Coordination normal.  Skin: Skin is warm and dry. Capillary refill takes less than 2 seconds.  Psychiatric: She has a normal mood and affect.  Nursing note and vitals reviewed.    ED Treatments / Results  Labs (all labs ordered are listed, but only abnormal results are displayed) Labs Reviewed  COMPREHENSIVE METABOLIC PANEL - Abnormal; Notable for the following components:      Result Value   Sodium 134 (*)    Potassium 3.4 (*)    Chloride 98 (*)    Glucose, Bld 303 (*)    Albumin 3.3 (*)    ALT 10 (*)    All other components within normal limits  CBG MONITORING, ED - Abnormal;  Notable for the following components:   Glucose-Capillary 168 (*)    All other components within normal limits  LIPASE, BLOOD  CBC WITH DIFFERENTIAL/PLATELET  URINALYSIS, ROUTINE W REFLEX MICROSCOPIC    EKG  EKG Interpretation None       Radiology Ct Abdomen Pelvis W Contrast  Result Date: 07/27/2017 CLINICAL DATA:  Right-sided abdominal pain. EXAM: CT ABDOMEN AND PELVIS WITH CONTRAST TECHNIQUE: Multidetector CT imaging of the abdomen and pelvis was performed using the standard protocol following bolus administration of intravenous contrast. CONTRAST:  187m ISOVUE-300 IOPAMIDOL (ISOVUE-300) INJECTION 61% COMPARISON:  Abdominal CT and pelvic ultrasound 01/19/2017 FINDINGS: Lower chest: Subsegmental right lower lobe atelectasis. There are coronary artery calcifications. Hepatobiliary: Post cholecystectomy with unchanged intra and extrahepatic biliary ductal dilatation. Common bile duct measures 13 mm at the porta hepatis. No focal hepatic lesion. Pancreas: No ductal dilatation or inflammation. Spleen: Normal in size without focal abnormality. Adrenals/Urinary Tract: No adrenal nodule. No hydronephrosis or perinephric edema. Homogeneous renal enhancement with symmetric excretion on delayed phase imaging. Small cysts in both kidneys are unchanged from prior exam. Urinary bladder is physiologically distended without wall thickening. Stomach/Bowel: Stomach physiologically distended without wall thickening. No small bowel inflammation, wall thickening or obstruction. Normal appendix. Small to moderate colonic stool burden without colonic wall thickening or inflammatory change. Sigmoid diverticulosis without diverticulitis. Vascular/Lymphatic: Aortic atherosclerosis. No aneurysm. No enlarged abdominal or pelvic lymph nodes. Reproductive: Uterus and bilateral adnexa are unremarkable. Other: No free air, free fluid, or intra-abdominal fluid collection. Tiny fat containing umbilical hernia. Musculoskeletal:  Degenerative change in the lumbar spine with Modic endplate changes at LV6-F5 stable in appearance from prior. There are no acute or suspicious osseous abnormalities. IMPRESSION: 1. No acute abnormality or explanation for abdominal pain. Particularly, normal appendix. 2. Colonic diverticulosis without diverticulitis. Aortic Atherosclerosis (ICD10-I70.0). Electronically Signed   By: MJeb LeveringM.D.   On: 07/27/2017 00:28    Procedures Procedures (including critical care time)  Medications Ordered in ED Medications  morphine 4 MG/ML injection 4 mg (4 mg Intravenous Given 07/26/17 2304)  ondansetron (ZOFRAN) injection 4 mg (4 mg Intravenous Given 07/26/17 2304)  iopamidol (ISOVUE-300) 61 % injection 100 mL (100 mLs Intravenous Contrast Given 07/27/17 0007)     Initial Impression / Assessment and Plan / ED Course  I have reviewed the triage vital signs  and the nursing notes.  Pertinent labs & imaging results that were available during my care of the patient were reviewed by me and considered in my medical decision making (see chart for details).     On review of medical records, patient seen at [facility] last August for right lower quadrant pain as well.  Pt is well appearing.  Pain to the right lower abdomen and RLE.  Doubt surgical abdomen or TOA.  I suspect lumbar radiculopathy, but given that patient has some right lower quadrant pain I will obtain CT abdomen and pelvis.  CT abd/pelvis is negative for appendicitis, uterus, adnexa unremarkable.  Blood sugar is elevated, but pt has not taken her Lantus.  Otherwise, labs are reassuring.    On recheck, CBG 168.  Patient tolerating oral fluids without difficulty.  Pain is improved after IV medication.  She appears stable for discharge.  Agrees to follow-up with PCP.  Return precautions discussed.  Final Clinical Impressions(s) / ED Diagnoses   Final diagnoses:  Groin pain, right  Sciatica of right side    ED Discharge Orders    None         Kem Parkinson, PA-C 07/27/17 0125    Milton Ferguson, MD 07/28/17 1545

## 2017-07-26 NOTE — ED Triage Notes (Signed)
Pt c/o of of bilateral lower abdominal pain x 2 days. Denies fever or urinary symptoms.

## 2017-07-26 NOTE — ED Triage Notes (Signed)
Pt c/o right lower abd pain that radiates down leg x 2 days. Pt denies any urinary symptoms or n/v/d.

## 2017-07-26 NOTE — ED Notes (Signed)
Pt had been called multiple times by lab for blood draws and did not answer

## 2017-07-27 LAB — URINALYSIS, ROUTINE W REFLEX MICROSCOPIC
Bilirubin Urine: NEGATIVE
Glucose, UA: 50 mg/dL — AB
Hgb urine dipstick: NEGATIVE
Ketones, ur: NEGATIVE mg/dL
Leukocytes, UA: NEGATIVE
Nitrite: NEGATIVE
Protein, ur: NEGATIVE mg/dL
Specific Gravity, Urine: >1.046 — ABNORMAL HIGH (ref 1.005–1.030)
pH: 5 (ref 5.0–8.0)

## 2017-07-27 LAB — CBG MONITORING, ED: Glucose-Capillary: 168 mg/dL — ABNORMAL HIGH (ref 65–99)

## 2017-07-27 MED ORDER — HYDROCODONE-ACETAMINOPHEN 5-325 MG PO TABS: ORAL_TABLET | ORAL | 0 refills | Status: DC

## 2017-07-27 MED ORDER — METHOCARBAMOL 500 MG PO TABS: 500.0000 mg | ORAL_TABLET | Freq: Three times a day (TID) | ORAL | 0 refills | Status: DC

## 2017-07-27 NOTE — ED Notes (Signed)
ED Provider at bedside. 

## 2017-07-27 NOTE — ED Notes (Signed)
Patient transported to CT 

## 2017-07-27 NOTE — Discharge Instructions (Signed)
Alternate ice and heat to your lower back.  Follow-up with your primary doctor for recheck.  Return to the ER for any worsening symptoms such as fever, vomiting, or increasing pain to your abdomen.

## 2017-07-28 NOTE — Congregational Nurse Program (Signed)
Congregational Nurse Program Note  Date of Encounter: 07/28/2017  Past Medical History: Past Medical History:  Diagnosis Date  . Arthritis   . Depression   . Diabetes mellitus   . GERD (gastroesophageal reflux disease)   . Hypercholesterolemia   . Hyperlipidemia   . Hypertension   . Sleep apnea    pt sts does not use "cause i dont think I need it anymore".    Encounter Details: CNP Questionnaire - 07/27/17 1130      Questionnaire   Patient Status  Not Applicable    Race  White or Caucasian    Location Patient Served At  [employer] [place]    Insurance  Medicaid    Uninsured  Not Applicable    Food  Yes, have food insecurities    Housing/Utilities  Yes, have permanent housing    Transportation  No transportation needs    Interpersonal Safety  Yes, feel physically and emotionally safe where you currently live    Medication  No medication insecurities    Medical Provider  Yes    Referrals  Not Applicable    ED Visit Averted  Not Applicable    Life-Saving Intervention Made  Not Applicable      Came to food panty at [employer] [facility] and had blood pressure taken. BP: 164/85 P:69 Has doctor appointment last week of March. Stated she had not taken medication today.    Jenene SlickerEmma Jeffre Enriques, RN, [facility] [phone]

## 2017-08-15 ENCOUNTER — Ambulatory Visit: Payer: Medicaid Other | Attending: Family Medicine | Admitting: Family Medicine

## 2017-08-15 ENCOUNTER — Encounter: Payer: Self-pay | Admitting: Family Medicine

## 2017-08-15 VITALS — BP 142/83 | HR 92 | Temp 97.3°F | Ht 62.0 in | Wt 170.0 lb

## 2017-08-15 DIAGNOSIS — G4709 Other insomnia: Secondary | ICD-10-CM

## 2017-08-15 DIAGNOSIS — I10 Essential (primary) hypertension: Secondary | ICD-10-CM | POA: Insufficient documentation

## 2017-08-15 DIAGNOSIS — K219 Gastro-esophageal reflux disease without esophagitis: Secondary | ICD-10-CM | POA: Diagnosis not present

## 2017-08-15 DIAGNOSIS — Z794 Long term (current) use of insulin: Secondary | ICD-10-CM

## 2017-08-15 DIAGNOSIS — M545 Low back pain, unspecified: Secondary | ICD-10-CM

## 2017-08-15 DIAGNOSIS — E118 Type 2 diabetes mellitus with unspecified complications: Secondary | ICD-10-CM | POA: Diagnosis not present

## 2017-08-15 DIAGNOSIS — IMO0002 Reserved for concepts with insufficient information to code with codable children: Secondary | ICD-10-CM

## 2017-08-15 DIAGNOSIS — E1142 Type 2 diabetes mellitus with diabetic polyneuropathy: Secondary | ICD-10-CM | POA: Diagnosis not present

## 2017-08-15 DIAGNOSIS — E1165 Type 2 diabetes mellitus with hyperglycemia: Secondary | ICD-10-CM | POA: Diagnosis not present

## 2017-08-15 DIAGNOSIS — F329 Major depressive disorder, single episode, unspecified: Secondary | ICD-10-CM | POA: Insufficient documentation

## 2017-08-15 DIAGNOSIS — Z888 Allergy status to other drugs, medicaments and biological substances status: Secondary | ICD-10-CM | POA: Insufficient documentation

## 2017-08-15 DIAGNOSIS — R6889 Other general symptoms and signs: Secondary | ICD-10-CM | POA: Diagnosis not present

## 2017-08-15 DIAGNOSIS — G8929 Other chronic pain: Secondary | ICD-10-CM | POA: Diagnosis not present

## 2017-08-15 DIAGNOSIS — Z9851 Tubal ligation status: Secondary | ICD-10-CM | POA: Diagnosis not present

## 2017-08-15 DIAGNOSIS — Z9049 Acquired absence of other specified parts of digestive tract: Secondary | ICD-10-CM | POA: Insufficient documentation

## 2017-08-15 DIAGNOSIS — Z79891 Long term (current) use of opiate analgesic: Secondary | ICD-10-CM | POA: Diagnosis not present

## 2017-08-15 DIAGNOSIS — G4730 Sleep apnea, unspecified: Secondary | ICD-10-CM | POA: Insufficient documentation

## 2017-08-15 DIAGNOSIS — G4700 Insomnia, unspecified: Secondary | ICD-10-CM | POA: Diagnosis not present

## 2017-08-15 DIAGNOSIS — Z79899 Other long term (current) drug therapy: Secondary | ICD-10-CM | POA: Insufficient documentation

## 2017-08-15 DIAGNOSIS — E782 Mixed hyperlipidemia: Secondary | ICD-10-CM | POA: Diagnosis not present

## 2017-08-15 LAB — GLUCOSE, POCT (MANUAL RESULT ENTRY): POC Glucose: 209 mg/dl — AB (ref 70–99)

## 2017-08-15 MED ORDER — ATORVASTATIN CALCIUM 40 MG PO TABS: 40.0000 mg | ORAL_TABLET | Freq: Every day | ORAL | 0 refills | Status: DC

## 2017-08-15 MED ORDER — GABAPENTIN 300 MG PO CAPS: 300.0000 mg | ORAL_CAPSULE | Freq: Two times a day (BID) | ORAL | 2 refills | Status: DC

## 2017-08-15 MED ORDER — METHOCARBAMOL 500 MG PO TABS: 500.0000 mg | ORAL_TABLET | Freq: Three times a day (TID) | ORAL | 1 refills | Status: DC

## 2017-08-15 MED ORDER — OMEPRAZOLE 20 MG PO CPDR: 20.0000 mg | DELAYED_RELEASE_CAPSULE | Freq: Every day | ORAL | 0 refills | Status: DC

## 2017-08-15 MED ORDER — SITAGLIPTIN-METFORMIN HCL 50-1000 MG PO TABS
1.0000 | ORAL_TABLET | Freq: Two times a day (BID) | ORAL | 0 refills | Status: DC
Start: 2017-08-15 — End: 2017-11-04

## 2017-08-15 MED ORDER — LISINOPRIL 20 MG PO TABS
20.0000 mg | ORAL_TABLET | Freq: Every day | ORAL | 0 refills | Status: DC
Start: 2017-08-15 — End: 2018-02-27

## 2017-08-15 MED ORDER — TRAZODONE HCL 50 MG PO TABS: 50.0000 mg | ORAL_TABLET | Freq: Every evening | ORAL | 3 refills | Status: DC | PRN

## 2017-08-15 MED FILL — METHOCARBAMOL 500 MG TABLET: 500 | 30 days supply | Qty: 90 | Fill #0

## 2017-08-15 MED FILL — LISINOPRIL 20 MG TAB: 20 | 30 days supply | Qty: 30 | Fill #1

## 2017-08-15 MED FILL — OMEPRAZOLE DR 20 MG CAPSULE: 20 | 30 days supply | Qty: 30 | Fill #1

## 2017-08-15 MED FILL — GABAPENTIN 300 MG CAPSULE: 300 | 30 days supply | Qty: 60 | Fill #1

## 2017-08-15 MED FILL — CETIRIZINE HCL 10 MG TABS: 10 | 30 days supply | Qty: 30 | Fill #1

## 2017-08-15 MED FILL — traZODone HCL 50 MG TABS: 50 | 30 days supply | Qty: 30 | Fill #0

## 2017-08-15 NOTE — Progress Notes (Signed)
Subjective:  Patient ID: Sarah Charles, female    DOB: February 01, 1961  Age: 57 y.o. MRN: [id number]  CC: Establish Care and Diabetes   HPI Sarah Charles is a 57 year old female with a history of type 2 diabetes mellitus (A1c 10.8), hypertension, hyperlipidemia, GERD who presents today for follow-up of her diabetes mellitus. At her last office visit Lantus had been increased to 35 units however she continues to take 28 units of Lantus with fasting sugars ranging between 150 - 200, denies hypoglycemia.  She complains of low back pain which radiates around to her anterior lower abdomen and sometimes down her legs but no numbness of the extremities.  She previously received Robaxin from her PCP but has run out of this. Also complains of cold intolerance but no fevers, upper respiratory symptoms, shortness of breath. She has multiple nighttime awakenings and denies intake of caffeinated products late in the day; has tried sleep hygiene with no improvement in symptoms. Reflux symptoms are controlled. Tolerating her antihypertensive and her statin with no complaints of adverse effects.  Past Medical History:  Diagnosis Date  . Arthritis   . Depression   . Diabetes mellitus   . GERD (gastroesophageal reflux disease)   . Hypercholesterolemia   . Hyperlipidemia   . Hypertension   . Sleep apnea    pt sts does not use "cause i dont think I need it anymore".    Past Surgical History:  Procedure Laterality Date  . CHOLECYSTECTOMY    . INCISION AND DRAINAGE ABSCESS Left 12/22/2012   Procedure: INCISION AND DRAINAGE ABSCESS;  Surgeon: Jamesetta So, MD;  Location: [facility];  Service: General;  Laterality: Left;  . TONSILLECTOMY    . TUBAL LIGATION      Allergies  Allergen Reactions  . Cymbalta [Duloxetine Hcl] Nausea Only    Nausea, lack of therapeutic effect     Outpatient Medications Prior to Visit  Medication Sig Dispense Refill  . Blood Glucose Monitoring Suppl (ACCU-CHEK AVIVA PLUS)  w/Device KIT 1 each by Does not apply route 3 (three) times daily. 1 kit 0  . buPROPion (WELLBUTRIN SR) 150 MG 12 hr tablet Take 1 tablet (150 mg total) by mouth 2 (two) times daily. 180 tablet 0  . cetirizine (ZYRTEC) 10 MG tablet Take 1 tablet (10 mg total) by mouth daily. 30 tablet 1  . glucose blood (ACCU-CHEK AVIVA) test strip Use as instructed 100 each 12  . HYDROcodone-acetaminophen (NORCO/VICODIN) 5-325 MG tablet Take one tab po q 4 hrs prn pain 10 tablet 0  . Insulin Glargine (LANTUS SOLOSTAR) 100 UNIT/ML Solostar Pen Inject 35 Units into the skin at bedtime. 15 mL 2  . Lancet Devices (ACCU-CHEK SOFTCLIX) lancets Use as instructed 1 each 12  . metoCLOPramide (REGLAN) 10 MG tablet Take 1 tablet (10 mg total) by mouth every 8 (eight) hours as needed for nausea or vomiting. 40 tablet 0  . omega-3 acid ethyl esters (LOVAZA) 1 g capsule Take 1 capsule (1 g total) by mouth 2 (two) times daily. 180 capsule 0  . Omega-3 Fatty Acids (FISH OIL PO) Take 1 capsule by mouth 2 (two) times daily.    . TRUEPLUS LANCETS 28G MISC Use as directed 100 each 12  . atorvastatin (LIPITOR) 40 MG tablet Take 1 tablet (40 mg total) by mouth daily. 90 tablet 0  . gabapentin (NEURONTIN) 300 MG capsule Take 1 capsule (300 mg total) by mouth 2 (two) times daily. 60 capsule 2  . lisinopril (PRINIVIL,ZESTRIL) 20  MG tablet Take 1 tablet (20 mg total) by mouth daily. 90 tablet 0  . methocarbamol (ROBAXIN) 500 MG tablet Take 1 tablet (500 mg total) by mouth 3 (three) times daily. 21 tablet 0  . omeprazole (PRILOSEC) 20 MG capsule Take 1 capsule (20 mg total) by mouth daily. 90 capsule 0  . sitaGLIPtin-metformin (JANUMET) 50-1000 MG tablet Take 1 tablet by mouth 2 (two) times daily with a meal. 180 tablet 0   No facility-administered medications prior to visit.     ROS Review of Systems  Constitutional: Negative for activity change, appetite change and fatigue.  HENT: Negative for congestion, sinus pressure and sore  throat.   Eyes: Negative for visual disturbance.  Respiratory: Negative for cough, chest tightness, shortness of breath and wheezing.   Cardiovascular: Negative for chest pain and palpitations.  Gastrointestinal: Negative for abdominal distention, abdominal pain and constipation.  Endocrine: Negative for polydipsia.  Genitourinary: Negative for dysuria and frequency.  Musculoskeletal: Positive for back pain. Negative for arthralgias.  Skin: Negative for rash.  Neurological: Negative for tremors, light-headedness and numbness.  Hematological: Does not bruise/bleed easily.  Psychiatric/Behavioral: Positive for sleep disturbance. Negative for agitation and behavioral problems.    Objective:  BP (!) 142/83   Pulse 92   Temp (!) 97.3 F (36.3 C) (Oral)   Ht 5' 2" (1.575 m)   Wt 170 lb (77.1 kg)   SpO2 96%   BMI 31.09 kg/m   BP/Weight 08/15/2017 07/27/2017 07/26/2017  Systolic BP 142 113 156  Diastolic BP 83 76 86  Wt. (Lbs) 170 - 147  BMI 31.09 - 26.04      Physical Exam  Constitutional: She is oriented to person, place, and time. She appears well-developed and well-nourished.  Cardiovascular: Normal rate, normal heart sounds and intact distal pulses.  No murmur heard. Pulmonary/Chest: Effort normal and breath sounds normal. She has no wheezes. She has no rales. She exhibits no tenderness.  Abdominal: Soft. Bowel sounds are normal. She exhibits no distension and no mass. There is no tenderness.  Musculoskeletal: Normal range of motion. She exhibits no tenderness (negative lumbar spine TTP).  Negative straight leg raise bilaterally  Neurological: She is alert and oriented to person, place, and time.  Skin: Skin is warm and dry.  Psychiatric: She has a normal mood and affect.     CMP Latest Ref Rng & Units 07/26/2017 05/30/2017 01/19/2017  Glucose 65 - 99 mg/dL 303(H) 363(H) 343(H)  BUN 6 - 20 mg/dL 15 14 19  Creatinine 0.44 - 1.00 mg/dL 0.53 0.49(L) 0.50  Sodium 135 - 145 mmol/L  134(L) 138 136  Potassium 3.5 - 5.1 mmol/L 3.4(L) 3.7 3.9  Chloride 101 - 111 mmol/L 98(L) 95(L) 100(L)  CO2 22 - 32 mmol/L 24 26 25  Calcium 8.9 - 10.3 mg/dL 9.2 9.8 9.7  Total Protein 6.5 - 8.1 g/dL 6.7 6.8 7.6  Total Bilirubin 0.3 - 1.2 mg/dL 0.4 0.3 0.8  Alkaline Phos 38 - 126 U/L 122 124(H) 130(H)  AST 15 - 41 U/L 16 15 14(L)  ALT 14 - 54 U/L 10(L) 13 13(L)   Lipid Panel     Component Value Date/Time   CHOL 222 (H) 05/30/2017 1627   TRIG 518 (H) 05/30/2017 1627   HDL 33 (L) 05/30/2017 1627   CHOLHDL 6.7 (H) 05/30/2017 1627   CHOLHDL 9.7 (H) 07/23/2016 1638   VLDL NOT CALC 07/23/2016 1638   LDLCALC Comment 05/30/2017 1627     Lab Results  Component   Value Date   HGBA1C 10.8 05/30/2017    Assessment & Plan:   1. Uncontrolled type 2 diabetes mellitus with complication, with long-term current use of insulin (HCC) Controlled with A1c of 10.8 Advised to take 35 units of Lantus still been taking Diabetic diet, lifestyle modifications We will review blood sugar log at next visit - POCT glucose (manual entry) - sitaGLIPtin-metformin (JANUMET) 50-1000 MG tablet; Take 1 tablet by mouth 2 (two) times daily with a meal.  Dispense: 180 tablet; Refill: 0 - lisinopril (PRINIVIL,ZESTRIL) 20 MG tablet; Take 1 tablet (20 mg total) by mouth daily.  Dispense: 90 tablet; Refill: 0  2. Cold intolerance - TSH  3. Chronic midline low back pain without sciatica Placed on Robaxin  4. Gastroesophageal reflux disease, esophagitis presence not specified Stable - omeprazole (PRILOSEC) 20 MG capsule; Take 1 capsule (20 mg total) by mouth daily.  Dispense: 90 capsule; Refill: 0  5. Essential hypertension Slightly elevated No change today Low-sodium diet - lisinopril (PRINIVIL,ZESTRIL) 20 MG tablet; Take 1 tablet (20 mg total) by mouth daily.  Dispense: 90 tablet; Refill: 0  6. Diabetic polyneuropathy associated with type 2 diabetes mellitus (HCC) Stable - gabapentin (NEURONTIN) 300 MG  capsule; Take 1 capsule (300 mg total) by mouth 2 (two) times daily.  Dispense: 60 capsule; Refill: 2  7. Mixed hyperlipidemia Controlled - atorvastatin (LIPITOR) 40 MG tablet; Take 1 tablet (40 mg total) by mouth daily.  Dispense: 90 tablet; Refill: 0  8.  Insomnia Sleep hygiene discussed Placed on trazodone Meds ordered this encounter  Medications  . methocarbamol (ROBAXIN) 500 MG tablet    Sig: Take 1 tablet (500 mg total) by mouth 3 (three) times daily.    Dispense:  90 tablet    Refill:  1  . traZODone (DESYREL) 50 MG tablet    Sig: Take 1 tablet (50 mg total) by mouth at bedtime as needed for sleep.    Dispense:  30 tablet    Refill:  3  . sitaGLIPtin-metformin (JANUMET) 50-1000 MG tablet    Sig: Take 1 tablet by mouth 2 (two) times daily with a meal.    Dispense:  180 tablet    Refill:  0  . omeprazole (PRILOSEC) 20 MG capsule    Sig: Take 1 capsule (20 mg total) by mouth daily.    Dispense:  90 capsule    Refill:  0  . lisinopril (PRINIVIL,ZESTRIL) 20 MG tablet    Sig: Take 1 tablet (20 mg total) by mouth daily.    Dispense:  90 tablet    Refill:  0  . gabapentin (NEURONTIN) 300 MG capsule    Sig: Take 1 capsule (300 mg total) by mouth 2 (two) times daily.    Dispense:  60 capsule    Refill:  2  . atorvastatin (LIPITOR) 40 MG tablet    Sig: Take 1 tablet (40 mg total) by mouth daily.    Dispense:  90 tablet    Refill:  0    Follow-up: Return in about 3 months (around 11/15/2017) for Follow-up on diabetes mellitus.   Charlott Rakes MD

## 2017-08-15 NOTE — Patient Instructions (Signed)
Diabetes Mellitus and Nutrition When you have diabetes (diabetes mellitus), it is very important to have healthy eating habits because your blood sugar (glucose) levels are greatly affected by what you eat and drink. Eating healthy foods in the appropriate amounts, at about the same times every day, can help you:  Control your blood glucose.  Lower your risk of heart disease.  Improve your blood pressure.  Reach or maintain a healthy weight.  Every person with diabetes is different, and each person has different needs for a meal plan. Your health care provider may recommend that you work with a diet and nutrition specialist (dietitian) to make a meal plan that is best for you. Your meal plan may vary depending on factors such as:  The calories you need.  The medicines you take.  Your weight.  Your blood glucose, blood pressure, and cholesterol levels.  Your activity level.  Other health conditions you have, such as heart or kidney disease.  How do carbohydrates affect me? Carbohydrates affect your blood glucose level more than any other type of food. Eating carbohydrates naturally increases the amount of glucose in your blood. Carbohydrate counting is a method for keeping track of how many carbohydrates you eat. Counting carbohydrates is important to keep your blood glucose at a healthy level, especially if you use insulin or take certain oral diabetes medicines. It is important to know how many carbohydrates you can safely have in each meal. This is different for every person. Your dietitian can help you calculate how many carbohydrates you should have at each meal and for snack. Foods that contain carbohydrates include:  Bread, cereal, rice, pasta, and crackers.  Potatoes and corn.  Peas, beans, and lentils.  Milk and yogurt.  Fruit and juice.  Desserts, such as cakes, cookies, ice cream, and candy.  How does alcohol affect me? Alcohol can cause a sudden decrease in blood  glucose (hypoglycemia), especially if you use insulin or take certain oral diabetes medicines. Hypoglycemia can be a life-threatening condition. Symptoms of hypoglycemia (sleepiness, dizziness, and confusion) are similar to symptoms of having too much alcohol. If your health care provider says that alcohol is safe for you, follow these guidelines:  Limit alcohol intake to no more than 1 drink per day for nonpregnant women and 2 drinks per day for men. One drink equals 12 oz of beer, 5 oz of wine, or 1 oz of hard liquor.  Do not drink on an empty stomach.  Keep yourself hydrated with water, diet soda, or unsweetened iced tea.  Keep in mind that regular soda, juice, and other mixers may contain a lot of sugar and must be counted as carbohydrates.  What are tips for following this plan? Reading food labels  Start by checking the serving size on the label. The amount of calories, carbohydrates, fats, and other nutrients listed on the label are based on one serving of the food. Many foods contain more than one serving per package.  Check the total grams (g) of carbohydrates in one serving. You can calculate the number of servings of carbohydrates in one serving by dividing the total carbohydrates by 15. For example, if a food has 30 g of total carbohydrates, it would be equal to 2 servings of carbohydrates.  Check the number of grams (g) of saturated and trans fats in one serving. Choose foods that have low or no amount of these fats.  Check the number of milligrams (mg) of sodium in one serving. Most people   should limit total sodium intake to less than 2,300 mg per day.  Always check the nutrition information of foods labeled as "low-fat" or "nonfat". These foods may be higher in added sugar or refined carbohydrates and should be avoided.  Talk to your dietitian to identify your daily goals for nutrients listed on the label. Shopping  Avoid buying canned, premade, or processed foods. These  foods tend to be high in fat, sodium, and added sugar.  Shop around the outside edge of the grocery store. This includes fresh fruits and vegetables, bulk grains, fresh meats, and fresh dairy. Cooking  Use low-heat cooking methods, such as baking, instead of high-heat cooking methods like deep frying.  Cook using healthy oils, such as olive, canola, or sunflower oil.  Avoid cooking with butter, cream, or high-fat meats. Meal planning  Eat meals and snacks regularly, preferably at the same times every day. Avoid going long periods of time without eating.  Eat foods high in fiber, such as fresh fruits, vegetables, beans, and whole grains. Talk to your dietitian about how many servings of carbohydrates you can eat at each meal.  Eat 4-6 ounces of lean protein each day, such as lean meat, chicken, fish, eggs, or tofu. 1 ounce is equal to 1 ounce of meat, chicken, or fish, 1 egg, or 1/4 cup of tofu.  Eat some foods each day that contain healthy fats, such as avocado, nuts, seeds, and fish. Lifestyle   Check your blood glucose regularly.  Exercise at least 30 minutes 5 or more days each week, or as told by your health care provider.  Take medicines as told by your health care provider.  Do not use any products that contain nicotine or tobacco, such as cigarettes and e-cigarettes. If you need help quitting, ask your health care provider.  Work with a counselor or diabetes educator to identify strategies to manage stress and any emotional and social challenges. What are some questions to ask my health care provider?  Do I need to meet with a diabetes educator?  Do I need to meet with a dietitian?  What number can I call if I have questions?  When are the best times to check my blood glucose? Where to find more information:  American Diabetes Association: diabetes.org/food-and-fitness/food  Academy of Nutrition and Dietetics:  www.eatright.org/resources/health/diseases-and-conditions/diabetes  National Institute of Diabetes and Digestive and Kidney Diseases (NIH): www.niddk.nih.gov/health-information/diabetes/overview/diet-eating-physical-activity Summary  A healthy meal plan will help you control your blood glucose and maintain a healthy lifestyle.  Working with a diet and nutrition specialist (dietitian) can help you make a meal plan that is best for you.  Keep in mind that carbohydrates and alcohol have immediate effects on your blood glucose levels. It is important to count carbohydrates and to use alcohol carefully. This information is not intended to replace advice given to you by your health care provider. Make sure you discuss any questions you have with your health care provider. Document Released: 02/04/2005 Document Revised: 06/14/2016 Document Reviewed: 06/14/2016 Elsevier Interactive Patient Education  2018 Elsevier Inc.  

## 2017-08-16 ENCOUNTER — Other Ambulatory Visit: Payer: Self-pay | Admitting: Family Medicine

## 2017-08-16 ENCOUNTER — Encounter: Payer: Self-pay | Admitting: Pharmacist

## 2017-08-16 LAB — TSH: TSH: 2.73 u[IU]/mL (ref 0.450–4.500)

## 2017-08-16 NOTE — Progress Notes (Signed)
PA completed for Janumet. Pending review by [place] [employer].

## 2017-08-17 ENCOUNTER — Telehealth: Payer: Self-pay

## 2017-08-17 NOTE — Telephone Encounter (Signed)
Patient was called and informed of lab results. 

## 2017-08-25 MED FILL — BUPROPION SR 150 MG TABLET: 150 | 30 days supply | Qty: 60 | Fill #1

## 2017-08-26 ENCOUNTER — Other Ambulatory Visit: Payer: Self-pay

## 2017-08-26 DIAGNOSIS — R110 Nausea: Secondary | ICD-10-CM

## 2017-08-26 MED ORDER — PEN NEEDLES 31G X 5 MM MISC: 35.0000 [IU] | Freq: Every day | 11 refills | Status: DC

## 2017-08-26 MED ORDER — METOCLOPRAMIDE HCL 10 MG PO TABS: 10.0000 mg | ORAL_TABLET | Freq: Three times a day (TID) | ORAL | 0 refills | Status: DC | PRN

## 2017-08-26 MED FILL — ACCU-CHEK SOFTCLIX LANCETS: 30 days supply | Qty: 100 | Fill #0

## 2017-08-26 MED FILL — TRUEPLUS PEN NDL 31GX3/16: 31G X 5 MM | 30 days supply | Qty: 100 | Fill #0

## 2017-08-26 MED FILL — ACCU-CHEK AVIVA PLUS TEST S: 30 days supply | Qty: 100 | Fill #0

## 2017-08-26 MED FILL — METOCLOPRAMIDE 10 MG TABLET: 10 | 13 days supply | Qty: 40 | Fill #0

## 2017-08-26 MED FILL — TRUEPLUS PEN NDL 31GX3/16": 31G X 5 MM | 30 days supply | Qty: 100 | Fill #0

## 2017-09-19 ENCOUNTER — Other Ambulatory Visit: Payer: Self-pay | Admitting: Family Medicine

## 2017-09-19 DIAGNOSIS — R110 Nausea: Secondary | ICD-10-CM

## 2017-09-19 DIAGNOSIS — J328 Other chronic sinusitis: Secondary | ICD-10-CM

## 2017-09-19 MED FILL — CETIRIZINE HCL 10 MG TABS: 10 | 30 days supply | Qty: 30 | Fill #0

## 2017-09-19 MED FILL — LISINOPRIL 20 MG TAB: 20 | 30 days supply | Qty: 30 | Fill #2

## 2017-09-19 MED FILL — METOCLOPRAMIDE 10 MG TABLET: 10 | 13 days supply | Qty: 40 | Fill #0

## 2017-09-19 MED FILL — OMEPRAZOLE DR 20 MG CAPSULE: 20 | 30 days supply | Qty: 30 | Fill #2

## 2017-09-19 MED FILL — GABAPENTIN 300 MG CAPSULE: 300 | 30 days supply | Qty: 60 | Fill #2

## 2017-09-26 MED FILL — ACCU-CHEK SOFTCLIX LANCETS: 30 days supply | Qty: 100 | Fill #1

## 2017-09-26 MED FILL — TRUEPLUS PEN NDL 31GX3/16: 31G X 5 MM | 30 days supply | Qty: 100 | Fill #1

## 2017-09-26 MED FILL — TRUEPLUS PEN NDL 31GX3/16": 31G X 5 MM | 30 days supply | Qty: 100 | Fill #1

## 2017-09-26 MED FILL — ACCU-CHEK AVIVA PLUS TEST S: 30 days supply | Qty: 100 | Fill #1

## 2017-09-27 MED FILL — ACCU-CHEK AVIVA PLUS W/DEVI: W/DEVICE | 30 days supply | Qty: 1 | Fill #0

## 2017-10-05 MED FILL — LANTUS SOLOSTAR 100 UNITS/M: 100 | 25 days supply | Qty: 9 | Fill #1

## 2017-10-11 NOTE — Telephone Encounter (Signed)
Created in error

## 2017-10-13 ENCOUNTER — Ambulatory Visit: Payer: Medicaid Other | Admitting: Family Medicine

## 2017-10-29 ENCOUNTER — Emergency Department (HOSPITAL_COMMUNITY): Payer: Medicaid Other

## 2017-10-29 ENCOUNTER — Encounter (HOSPITAL_COMMUNITY): Payer: Self-pay | Admitting: Emergency Medicine

## 2017-10-29 ENCOUNTER — Other Ambulatory Visit: Payer: Self-pay

## 2017-10-29 ENCOUNTER — Observation Stay (HOSPITAL_COMMUNITY)
Admission: EM | Admit: 2017-10-29 | Discharge: 2017-10-30 | Disposition: A | Discharge status: Home / Self Care | Payer: Medicaid Other | Attending: Family Medicine | Admitting: Family Medicine

## 2017-10-29 DIAGNOSIS — Z888 Allergy status to other drugs, medicaments and biological substances status: Secondary | ICD-10-CM | POA: Diagnosis not present

## 2017-10-29 DIAGNOSIS — Z833 Family history of diabetes mellitus: Secondary | ICD-10-CM | POA: Insufficient documentation

## 2017-10-29 DIAGNOSIS — M1990 Unspecified osteoarthritis, unspecified site: Secondary | ICD-10-CM | POA: Insufficient documentation

## 2017-10-29 DIAGNOSIS — Z79899 Other long term (current) drug therapy: Secondary | ICD-10-CM | POA: Diagnosis not present

## 2017-10-29 DIAGNOSIS — F329 Major depressive disorder, single episode, unspecified: Secondary | ICD-10-CM | POA: Insufficient documentation

## 2017-10-29 DIAGNOSIS — Z9889 Other specified postprocedural states: Secondary | ICD-10-CM | POA: Diagnosis not present

## 2017-10-29 DIAGNOSIS — E785 Hyperlipidemia, unspecified: Secondary | ICD-10-CM | POA: Insufficient documentation

## 2017-10-29 DIAGNOSIS — Z8249 Family history of ischemic heart disease and other diseases of the circulatory system: Secondary | ICD-10-CM | POA: Insufficient documentation

## 2017-10-29 DIAGNOSIS — E1165 Type 2 diabetes mellitus with hyperglycemia: Secondary | ICD-10-CM | POA: Diagnosis not present

## 2017-10-29 DIAGNOSIS — I25110 Atherosclerotic heart disease of native coronary artery with unstable angina pectoris: Secondary | ICD-10-CM | POA: Diagnosis not present

## 2017-10-29 DIAGNOSIS — R0789 Other chest pain: Secondary | ICD-10-CM | POA: Diagnosis not present

## 2017-10-29 DIAGNOSIS — F17200 Nicotine dependence, unspecified, uncomplicated: Secondary | ICD-10-CM | POA: Diagnosis not present

## 2017-10-29 DIAGNOSIS — Z794 Long term (current) use of insulin: Secondary | ICD-10-CM | POA: Diagnosis not present

## 2017-10-29 DIAGNOSIS — I200 Unstable angina: Secondary | ICD-10-CM | POA: Diagnosis not present

## 2017-10-29 DIAGNOSIS — Z823 Family history of stroke: Secondary | ICD-10-CM | POA: Insufficient documentation

## 2017-10-29 DIAGNOSIS — K219 Gastro-esophageal reflux disease without esophagitis: Secondary | ICD-10-CM | POA: Diagnosis present

## 2017-10-29 DIAGNOSIS — E669 Obesity, unspecified: Secondary | ICD-10-CM | POA: Diagnosis not present

## 2017-10-29 DIAGNOSIS — F32A Depression, unspecified: Secondary | ICD-10-CM | POA: Diagnosis present

## 2017-10-29 DIAGNOSIS — R079 Chest pain, unspecified: Secondary | ICD-10-CM

## 2017-10-29 DIAGNOSIS — I10 Essential (primary) hypertension: Secondary | ICD-10-CM | POA: Diagnosis not present

## 2017-10-29 DIAGNOSIS — Z6831 Body mass index (BMI) 31.0-31.9, adult: Secondary | ICD-10-CM | POA: Insufficient documentation

## 2017-10-29 DIAGNOSIS — Z9049 Acquired absence of other specified parts of digestive tract: Secondary | ICD-10-CM | POA: Insufficient documentation

## 2017-10-29 DIAGNOSIS — E1169 Type 2 diabetes mellitus with other specified complication: Secondary | ICD-10-CM

## 2017-10-29 HISTORY — DX: Tobacco use: Z72.0

## 2017-10-29 HISTORY — DX: Obesity, unspecified: E66.9

## 2017-10-29 HISTORY — DX: Atherosclerotic heart disease of native coronary artery without angina pectoris: I25.10

## 2017-10-29 HISTORY — DX: Type 2 diabetes mellitus without complications: E11.9

## 2017-10-29 LAB — BASIC METABOLIC PANEL
Anion gap: 10 (ref 5–15)
BUN: 13 mg/dL (ref 6–20)
CO2: 27 mmol/L (ref 22–32)
Calcium: 9.2 mg/dL (ref 8.9–10.3)
Chloride: 101 mmol/L (ref 101–111)
Creatinine, Ser: 0.45 mg/dL (ref 0.44–1.00)
GFR calc Af Amer: >60 mL/min (ref >60)
GFR calc non Af Amer: >60 mL/min (ref >60)
Glucose, Bld: 345 mg/dL — ABNORMAL HIGH (ref 65–99)
Potassium: 4.1 mmol/L (ref 3.5–5.1)
Sodium: 138 mmol/L (ref 135–145)

## 2017-10-29 LAB — GLUCOSE, CAPILLARY: Glucose-Capillary: 112 mg/dL — ABNORMAL HIGH (ref 65–99)

## 2017-10-29 LAB — CBC
HCT: 41.2 % (ref 36.0–46.0)
Hemoglobin: 13.6 g/dL (ref 12.0–15.0)
MCH: 30 pg (ref 26.0–34.0)
MCHC: 33 g/dL (ref 30.0–36.0)
MCV: 90.9 fL (ref 78.0–100.0)
Platelets: 210 10*3/uL (ref 150–400)
RBC: 4.53 MIL/uL (ref 3.87–5.11)
RDW: 13.4 % (ref 11.5–15.5)
WBC: 7 10*3/uL (ref 4.0–10.5)

## 2017-10-29 LAB — PROTIME-INR
INR: 0.94
Prothrombin Time: 12.5 seconds (ref 11.4–15.2)

## 2017-10-29 LAB — HEMOGLOBIN A1C
Hgb A1c MFr Bld: 8.6 % — ABNORMAL HIGH (ref 4.8–5.6)
Mean Plasma Glucose: 200.12 mg/dL

## 2017-10-29 LAB — TROPONIN I
Troponin I: <0.03 ng/mL (ref <0.03)
Troponin I: <0.03 ng/mL (ref <0.03)

## 2017-10-29 LAB — CBG MONITORING, ED: Glucose-Capillary: 210 mg/dL — ABNORMAL HIGH (ref 65–99)

## 2017-10-29 LAB — TSH: TSH: 0.965 u[IU]/mL (ref 0.350–4.500)

## 2017-10-29 MED ORDER — SODIUM CHLORIDE 0.9% FLUSH
3.0000 mL | Freq: Two times a day (BID) | INTRAVENOUS | Status: DC
  Administered 2017-10-29 – 2017-10-30 (×2): 3 mL via INTRAVENOUS

## 2017-10-29 MED ORDER — ONDANSETRON HCL 4 MG/2ML IJ SOLN: 4.0000 mg | Freq: Four times a day (QID) | INTRAMUSCULAR | Status: DC | PRN

## 2017-10-29 MED ORDER — BUPROPION HCL ER (SR) 150 MG PO TB12
150.0000 mg | ORAL_TABLET | Freq: Two times a day (BID) | ORAL | Status: DC
  Administered 2017-10-29: 150 mg via ORAL
  Filled 2017-10-29 (×2): qty 1

## 2017-10-29 MED ORDER — NITROGLYCERIN 0.4 MG SL SUBL: 0.4000 mg | SUBLINGUAL_TABLET | SUBLINGUAL | Status: DC | PRN

## 2017-10-29 MED ORDER — SODIUM CHLORIDE 0.9% FLUSH: 3.0000 mL | INTRAVENOUS | Status: DC | PRN

## 2017-10-29 MED ORDER — ATORVASTATIN CALCIUM 40 MG PO TABS
40.0000 mg | ORAL_TABLET | Freq: Every day | ORAL | Status: DC
  Administered 2017-10-29 – 2017-10-30 (×2): 40 mg via ORAL
  Filled 2017-10-29 (×2): qty 1

## 2017-10-29 MED ORDER — METHOCARBAMOL 500 MG PO TABS
500.0000 mg | ORAL_TABLET | Freq: Three times a day (TID) | ORAL | Status: DC
  Administered 2017-10-29 – 2017-10-30 (×2): 500 mg via ORAL
  Filled 2017-10-29 (×3): qty 1

## 2017-10-29 MED ORDER — GABAPENTIN 300 MG PO CAPS
300.0000 mg | ORAL_CAPSULE | Freq: Two times a day (BID) | ORAL | Status: DC
  Administered 2017-10-29: 300 mg via ORAL
  Filled 2017-10-29 (×2): qty 1

## 2017-10-29 MED ORDER — HEPARIN BOLUS VIA INFUSION
4000.0000 [IU] | Freq: Once | INTRAVENOUS | Status: AC
  Administered 2017-10-29: 4000 [IU] via INTRAVENOUS

## 2017-10-29 MED ORDER — ACETAMINOPHEN 325 MG PO TABS: 650.0000 mg | ORAL_TABLET | Freq: Four times a day (QID) | ORAL | Status: DC | PRN

## 2017-10-29 MED ORDER — VITAMIN C 500 MG PO TABS
500.0000 mg | ORAL_TABLET | Freq: Every day | ORAL | Status: DC
  Administered 2017-10-29: 500 mg via ORAL
  Filled 2017-10-29 (×2): qty 1

## 2017-10-29 MED ORDER — ASPIRIN 81 MG PO CHEW
324.0000 mg | CHEWABLE_TABLET | Freq: Once | ORAL | Status: AC
  Administered 2017-10-29: 324 mg via ORAL
  Filled 2017-10-29: qty 4

## 2017-10-29 MED ORDER — TRAZODONE HCL 50 MG PO TABS
50.0000 mg | ORAL_TABLET | Freq: Every evening | ORAL | Status: DC | PRN
  Administered 2017-10-29: 50 mg via ORAL
  Filled 2017-10-29: qty 1

## 2017-10-29 MED ORDER — LISINOPRIL 20 MG PO TABS
20.0000 mg | ORAL_TABLET | Freq: Every day | ORAL | Status: DC
  Administered 2017-10-29: 20 mg via ORAL
  Filled 2017-10-29: qty 1

## 2017-10-29 MED ORDER — ONDANSETRON HCL 4 MG PO TABS: 4.0000 mg | ORAL_TABLET | Freq: Four times a day (QID) | ORAL | Status: DC | PRN

## 2017-10-29 MED ORDER — INSULIN GLARGINE 100 UNIT/ML ~~LOC~~ SOLN
35.0000 [IU] | Freq: Every day | SUBCUTANEOUS | Status: DC
  Filled 2017-10-29 (×2): qty 0.35

## 2017-10-29 MED ORDER — LORATADINE 10 MG PO TABS
10.0000 mg | ORAL_TABLET | Freq: Every day | ORAL | Status: DC
  Filled 2017-10-29: qty 1

## 2017-10-29 MED ORDER — INSULIN ASPART 100 UNIT/ML ~~LOC~~ SOLN
0.0000 [IU] | Freq: Three times a day (TID) | SUBCUTANEOUS | Status: DC
  Administered 2017-10-29 – 2017-10-30 (×2): 7 [IU] via SUBCUTANEOUS
  Administered 2017-10-30: 4 [IU] via SUBCUTANEOUS
  Filled 2017-10-29: qty 1

## 2017-10-29 MED ORDER — SODIUM CHLORIDE 0.9 % IV SOLN: 250.0000 mL | INTRAVENOUS | Status: DC | PRN

## 2017-10-29 MED ORDER — NICOTINE 7 MG/24HR TD PT24
7.0000 mg | MEDICATED_PATCH | Freq: Every day | TRANSDERMAL | Status: DC
  Filled 2017-10-29 (×3): qty 1

## 2017-10-29 MED ORDER — OMEGA-3-ACID ETHYL ESTERS 1 G PO CAPS
1.0000 g | ORAL_CAPSULE | Freq: Two times a day (BID) | ORAL | Status: DC
  Administered 2017-10-29: 1 g via ORAL
  Filled 2017-10-29 (×7): qty 1

## 2017-10-29 MED ORDER — ACETAMINOPHEN 650 MG RE SUPP: 650.0000 mg | Freq: Four times a day (QID) | RECTAL | Status: DC | PRN

## 2017-10-29 MED ORDER — HYDROCODONE-ACETAMINOPHEN 5-325 MG PO TABS: 1.0000 | ORAL_TABLET | ORAL | Status: DC | PRN

## 2017-10-29 MED ORDER — INSULIN ASPART 100 UNIT/ML ~~LOC~~ SOLN: 0.0000 [IU] | Freq: Every day | SUBCUTANEOUS | Status: DC

## 2017-10-29 MED ORDER — HEPARIN (PORCINE) IN NACL 100-0.45 UNIT/ML-% IJ SOLN
1000.0000 [IU]/h | INTRAMUSCULAR | Status: DC
  Administered 2017-10-29: 800 [IU]/h via INTRAVENOUS
  Filled 2017-10-29: qty 250

## 2017-10-29 MED ORDER — ASPIRIN EC 81 MG PO TBEC
81.0000 mg | DELAYED_RELEASE_TABLET | Freq: Every day | ORAL | Status: DC
  Filled 2017-10-29: qty 1

## 2017-10-29 MED ORDER — PANTOPRAZOLE SODIUM 40 MG PO TBEC
40.0000 mg | DELAYED_RELEASE_TABLET | Freq: Every day | ORAL | Status: DC
  Filled 2017-10-29: qty 1

## 2017-10-29 MED ORDER — NITROGLYCERIN 0.4 MG SL SUBL
0.4000 mg | SUBLINGUAL_TABLET | SUBLINGUAL | Status: DC | PRN
  Administered 2017-10-29 (×2): 0.4 mg via SUBLINGUAL
  Filled 2017-10-29: qty 1

## 2017-10-29 MED ORDER — METOPROLOL TARTRATE 25 MG PO TABS
25.0000 mg | ORAL_TABLET | Freq: Two times a day (BID) | ORAL | Status: DC
  Administered 2017-10-29 (×2): 25 mg via ORAL
  Filled 2017-10-29 (×2): qty 1

## 2017-10-29 NOTE — Progress Notes (Signed)
ANTICOAGULATION CONSULT NOTE - Initial Consult  Pharmacy Consult for Heparin Indication: chest pain/ACS  Allergies  Allergen Reactions  . Cymbalta [Duloxetine Hcl] Nausea Only    Nausea, lack of therapeutic effect    Patient Measurements: Height: 5\' 2"  (157.5 cm) Weight: 176 lb (79.8 kg) IBW/kg (Calculated) : 50.1 HEPARIN DW (KG): 67.8  Vital Signs: Temp: 98.1 F (36.7 C) (06/08 1114) Temp Source: Oral (06/08 1114) BP: 160/81 (06/08 1432) Pulse Rate: 77 (06/08 1432)  Labs: Recent Labs    10/29/17 1151  HGB 13.6  HCT 41.2  PLT 210  CREATININE 0.45  TROPONINI <0.03    Estimated Creatinine Clearance: 75.9 mL/min (by C-G formula based on SCr of 0.45 mg/dL).   Medical History: Past Medical History:  Diagnosis Date  . Arthritis   . Depression   . Diabetes mellitus   . GERD (gastroesophageal reflux disease)   . Hypercholesterolemia   . Hyperlipidemia   . Hypertension   . Sleep apnea    pt sts does not use "cause i dont think I need it anymore".    Medications:  See med rec  Assessment: 57 yo female presented to ED with chest pain. Troponins are normal. Plan is to admit for cardiac workup and start heparin per pharmacy.  Goal of Therapy:  Heparin level 0.3-0.7 units/ml Monitor platelets by anticoagulation protocol: Yes   Plan:  Give 4000 units bolus x 1 Start heparin infusion at 800 units/hr Check anti-Xa level in 6-8 hours and daily while on heparin Continue to monitor H&H and platelets  Elder CyphersLorie Yariela Tison, BS Loura BackPharm D, BCPS Clinical Pharmacist Pager [phone] 10/29/2017,3:44 PM

## 2017-10-29 NOTE — H&P (Signed)
History and Physical    Sarah Charles SEG:[id number] DOB: Jul 18, 1960 DOA: 10/29/2017  PCP: Charlott Rakes, MD   Patient coming from: Home  Chief Complaint: Chest pain  HPI: Sarah Charles is a 57 y.o. female with medical history significant for depression, type 2 diabetes, dyslipidemia, hypertension, obesity, tobacco abuse, and GERD who presented to the emergency department with several episodes of substernal chest pressure with radiation to her left arm.  She states that her initial episode began yesterday evening as she was driving back from dropping off a friend in [place].  The episode resolved spontaneously after approximately 10 minutes and then reoccurred earlier this morning for about the same duration and again resolved spontaneously.  However, on her repeat episode she had significant diaphoresis as well as some nausea and shortness of breath and was quite concerning for her.  She has never been diagnosed with any heart disease and states that she has a significant family history with her father who died of a heart attack at the age of 19. She denies any recent cough, lower extremity edema, orthopnea, paroxysmal nocturnal dyspnea, fever, or chills.   ED Course: Vital signs are currently stable and she is slightly hypertensive with systolic ranging from 737-106YIRS.  She has received a full dose aspirin as well as 2 sublingual nitroglycerin which have resolved her initial pain from 5/10 to now approximately 2/10.  Troponin is less than 0.03 and EKG is in normal sinus rhythm at 79 bpm with no ischemic changes.  Two-view chest x-ray currently with no acute findings.  Blood glucose is noted to be 345.  I have discussed this case with Dr. Meda Coffee of cardiology at [facility] who agrees that patient requires transfer for further evaluation of unstable angina to telemetry floor on heparin drip.  Review of Systems: All others reviewed and otherwise negative.  Past Medical History:  Diagnosis Date  .  Arthritis   . Depression   . Diabetes mellitus   . GERD (gastroesophageal reflux disease)   . Hypercholesterolemia   . Hyperlipidemia   . Hypertension   . Sleep apnea    pt sts does not use "cause i dont think I need it anymore".    Past Surgical History:  Procedure Laterality Date  . CHOLECYSTECTOMY    . INCISION AND DRAINAGE ABSCESS Left 12/22/2012   Procedure: INCISION AND DRAINAGE ABSCESS;  Surgeon: Jamesetta So, MD;  Location: [facility];  Service: General;  Laterality: Left;  . TONSILLECTOMY    . TUBAL LIGATION       reports that she has been smoking cigarettes.  She has a 41.00 pack-year smoking history. She has never used smokeless tobacco. She reports that she does not drink alcohol or use drugs.  Allergies  Allergen Reactions  . Cymbalta [Duloxetine Hcl] Nausea Only    Nausea, lack of therapeutic effect    Family History  Problem Relation Age of Onset  . Hypertension Mother   . CVA Mother   . COPD Mother   . Heart disease Mother   . Kidney Stones Mother   . CAD Father   . Hypercholesterolemia Father   . Heart attack Father   . Cancer Maternal Uncle   . Diabetes Maternal Grandmother   . Cancer Maternal Grandfather        lung cancer    Prior to Admission medications   Medication Sig Start Date End Date Taking? Authorizing Provider  atorvastatin (LIPITOR) 40 MG tablet Take 1 tablet (40 mg total) by  mouth daily. 08/15/17  Yes Charlott Rakes, MD  Blood Glucose Monitoring Suppl (ACCU-CHEK AVIVA PLUS) w/Device KIT 1 each by Does not apply route 3 (three) times daily. 06/02/17  Yes Hairston, Maylon Peppers, FNP  buPROPion (WELLBUTRIN SR) 150 MG 12 hr tablet Take 1 tablet (150 mg total) by mouth 2 (two) times daily. 05/30/17  Yes Hairston, Maylon Peppers, FNP  cetirizine (ZYRTEC) 10 MG tablet TAKE 1 TABLET (10 MG TOTAL) BY MOUTH DAILY. 09/19/17  Yes Charlott Rakes, MD  gabapentin (NEURONTIN) 300 MG capsule Take 1 capsule (300 mg total) by mouth 2 (two) times daily. 08/15/17  Yes  Charlott Rakes, MD  HYDROcodone-acetaminophen (NORCO/VICODIN) 5-325 MG tablet Take one tab po q 4 hrs prn pain Patient taking differently: Take 1 tablet by mouth every 4 (four) hours as needed. Take one tab po q 4 hrs prn pain 07/27/17  Yes Triplett, Tammy, PA-C  Insulin Glargine (LANTUS SOLOSTAR) 100 UNIT/ML Solostar Pen Inject 35 Units into the skin at bedtime. 06/28/17  Yes Newlin, Enobong, MD  Insulin Pen Needle (PEN NEEDLES) 31G X 5 MM MISC 35 Units by Does not apply route daily. 08/26/17  Yes Charlott Rakes, MD  Lancet Devices [facility]) lancets Use as instructed 06/02/17  Yes Hairston, Mandesia R, FNP  lisinopril (PRINIVIL,ZESTRIL) 20 MG tablet Take 1 tablet (20 mg total) by mouth daily. 08/15/17  Yes Charlott Rakes, MD  methocarbamol (ROBAXIN) 500 MG tablet Take 1 tablet (500 mg total) by mouth 3 (three) times daily. 08/15/17  Yes Newlin, Charlane Ferretti, MD  metoCLOPramide (REGLAN) 10 MG tablet TAKE 1 TABLET BY MOUTH EVERY 8 (EIGHT) HOURS AS NEEDED FOR NAUSEA OR VOMITING. 09/19/17  Yes Newlin, Enobong, MD  omega-3 acid ethyl esters (LOVAZA) 1 g capsule Take 1 capsule (1 g total) by mouth 2 (two) times daily. 06/01/17  Yes Hairston, Maylon Peppers, FNP  omeprazole (PRILOSEC) 20 MG capsule Take 1 capsule (20 mg total) by mouth daily. 08/15/17  Yes Charlott Rakes, MD  sitaGLIPtin-metformin (JANUMET) 50-1000 MG tablet Take 1 tablet by mouth 2 (two) times daily with a meal. 08/15/17  Yes Newlin, Enobong, MD  traZODone (DESYREL) 50 MG tablet Take 1 tablet (50 mg total) by mouth at bedtime as needed for sleep. 08/15/17  Yes Charlott Rakes, MD  TRUEPLUS LANCETS 28G MISC Use as directed 05/30/17  Yes Hairston, Mandesia R, FNP  vitamin C (ASCORBIC ACID) 500 MG tablet Take 500 mg by mouth daily.   Yes [provider]  glucose blood (ACCU-CHEK AVIVA) test strip Use as instructed 06/02/17   [name], FNP    Physical Exam: Vitals:   10/29/17 1113 10/29/17 1114 10/29/17 1343 10/29/17 1432  BP:   (!) 184/90 (!) 169/110 (!) 160/81  Pulse:  68 85 77  Resp:  '18 20 18  ' Temp:  98.1 F (36.7 C)    TempSrc:  Oral    SpO2:  97% 100% 96%  Weight: 79.8 kg (176 lb)     Height: '5\' 2"'  (1.575 m)       Constitutional: NAD, calm, comfortable Vitals:   10/29/17 1113 10/29/17 1114 10/29/17 1343 10/29/17 1432  BP:  (!) 184/90 (!) 169/110 (!) 160/81  Pulse:  68 85 77  Resp:  '18 20 18  ' Temp:  98.1 F (36.7 C)    TempSrc:  Oral    SpO2:  97% 100% 96%  Weight: 79.8 kg (176 lb)     Height: '5\' 2"'  (1.575 m)      Eyes: lids  and conjunctivae normal ENMT: Mucous membranes are moist.  Neck: normal, supple Respiratory: clear to auscultation bilaterally. Normal respiratory effort. No accessory muscle use.  Cardiovascular: Regular rate and rhythm, no murmurs. No extremity edema. Abdomen: no tenderness, no distention. Bowel sounds positive.  Musculoskeletal:  No joint deformity upper and lower extremities.   Skin: no rashes, lesions, ulcers.  Psychiatric: Normal judgment and insight. Alert and oriented x 3. Normal mood.   Labs on Admission: I have personally reviewed following labs and imaging studies  CBC: Recent Labs  Lab 10/29/17 1151  WBC 7.0  HGB 13.6  HCT 41.2  MCV 90.9  PLT 748   Basic Metabolic Panel: Recent Labs  Lab 10/29/17 1151  NA 138  K 4.1  CL 101  CO2 27  GLUCOSE 345*  BUN 13  CREATININE 0.45  CALCIUM 9.2   GFR: Estimated Creatinine Clearance: 75.9 mL/min (by C-G formula based on SCr of 0.45 mg/dL). Liver Function Tests: No results for input(s): AST, ALT, ALKPHOS, BILITOT, PROT, ALBUMIN in the last 168 hours. No results for input(s): LIPASE, AMYLASE in the last 168 hours. No results for input(s): AMMONIA in the last 168 hours. Coagulation Profile: No results for input(s): INR, PROTIME in the last 168 hours. Cardiac Enzymes: Recent Labs  Lab 10/29/17 1151  TROPONINI <0.03   BNP (last 3 results) No results for input(s): PROBNP in the last 8760  hours. HbA1C: No results for input(s): HGBA1C in the last 72 hours. CBG: No results for input(s): GLUCAP in the last 168 hours. Lipid Profile: No results for input(s): CHOL, HDL, LDLCALC, TRIG, CHOLHDL, LDLDIRECT in the last 72 hours. Thyroid Function Tests: No results for input(s): TSH, T4TOTAL, FREET4, T3FREE, THYROIDAB in the last 72 hours. Anemia Panel: No results for input(s): VITAMINB12, FOLATE, FERRITIN, TIBC, IRON, RETICCTPCT in the last 72 hours. Urine analysis:    Component Value Date/Time   COLORURINE YELLOW 07/27/2017 0145   APPEARANCEUR CLEAR 07/27/2017 0145   LABSPEC >1.046 (H) 07/27/2017 0145   PHURINE 5.0 07/27/2017 0145   GLUCOSEU 50 (A) 07/27/2017 0145   HGBUR NEGATIVE 07/27/2017 0145   HGBUR trace-intact 04/08/2008 1003   BILIRUBINUR NEGATIVE 07/27/2017 0145   BILIRUBINUR small 05/30/2017 1632   KETONESUR NEGATIVE 07/27/2017 0145   PROTEINUR NEGATIVE 07/27/2017 0145   UROBILINOGEN 0.2 05/30/2017 1632   UROBILINOGEN 0.2 01/28/2015 2244   NITRITE NEGATIVE 07/27/2017 0145   LEUKOCYTESUR NEGATIVE 07/27/2017 0145    Radiological Exams on Admission: Dg Chest 2 View  Result Date: 10/29/2017 CLINICAL DATA:  Chest pain EXAM: CHEST - 2 VIEW COMPARISON:  July 14, 2016 FINDINGS: Lungs are clear. The heart size and pulmonary vascularity are normal. No pneumothorax. No adenopathy. No bone lesions. IMPRESSION: No edema or consolidation. Electronically Signed   By: Lowella Grip III M.D.   On: 10/29/2017 11:44    EKG: Independently reviewed. NSR 79bpm.  Assessment/Plan Principal Problem:   Unstable angina (HCC) Active Problems:   OBESITY, NOS   TOBACCO DEPENDENCE   GASTROESOPHAGEAL REFLUX, NO ESOPHAGITIS   Depression   Hypertension   Type 2 diabetes mellitus with hyperlipidemia ([place])    1. Unstable angina.  Patient currently has many risk factors with heart score of 5 and will require transfer to [facility] telemetry floor for further evaluation by  cardiology as discussed with Dr. Meda Coffee.  Will place on heparin drip as requested and cycle troponins with repeat EKG in a.m.  Maintain on aspirin and will initiate metoprolol.  Nitroglycerin as needed for chest  pain.  2D echocardiogram for assessment of LVEF and WMAs. 2. Type 2 diabetes with hyperglycemia.  Hold home oral agents and maintain on Lantus as well as sliding scale insulin with plans for n.p.o. after midnight.  Diet heart healthy/carb modified. 3. Hypertension.  Maintain on lisinopril and add metoprolol as noted above. 4. Depression.  Maintain on Wellbutrin, with trazodone for insomnia. 5. GERD.  Maintain on PPI. 6. Tobacco abuse.  Counseled on cessation and will provide nicotine patch.   DVT prophylaxis: Heparin drip Code Status: Full Family Communication: Husband on phone Disposition Plan:Transfer to [facility] for Cardiology evaluation of UA Consults called:Dr. Meda Coffee of Cardiology Admission status: Tele, Obs   [facility] Pager [phone]  If 7PM-7AM, please contact night-coverage [email or web address] Password [facility]  10/29/2017, 3:03 PM

## 2017-10-29 NOTE — ED Provider Notes (Signed)
[facility] EMERGENCY DEPARTMENT Provider Note   CSN: [id number] Arrival date & time: 10/29/17  1110     History   Chief Complaint Chief Complaint  Patient presents with  . Chest Pain    HPI Sarah Charles is a 57 y.o. female.  HPI  The patient is a 57 year old female, she is currently treated for diabetes, hypertension, hypercholesterolemia and is a current daily smoker.  She reports that her husband is 8 years younger than her had a heart attack 3 weeks ago, she has been doing fine but asked her doctor to do a stress test which has not been yet done but is currently scheduled for 14 June.  She reports that last night she started to have some intense heaviness and pain in her shoulder and her arm which was very unusual, this started while she was driving, lasted the majority of the evening and then eased off.  This morning when she got back up and started moving around she developed recurrent arm symptoms as well as a feeling of heaviness on her chest.  She became very diaphoretic with this but did not get short of breath or have any coughing or fevers.  She denies swelling of her legs, denies any history of exertional symptoms but admittedly does not exert herself very often.  Currently her symptoms are gradually improving but are still mild and persistent especially in the chest.  Her arm has eased off.  She has never been seen by a cardiologist, has never had any evaluation of her heart, she does not take daily aspirin.  Past Medical History:  Diagnosis Date  . Arthritis   . Depression   . Diabetes mellitus   . GERD (gastroesophageal reflux disease)   . Hypercholesterolemia   . Hyperlipidemia   . Hypertension   . Sleep apnea    pt sts does not use "cause i dont think I need it anymore".    Patient Active Problem List   Diagnosis Date Noted  . Diabetic polyneuropathy associated with type 2 diabetes mellitus ([place]) 05/30/2017  . Mixed dyslipidemia 05/30/2017  . Depression  05/31/2016  . Hypertension 05/31/2016  . Uncontrolled type 2 diabetes mellitus with complication ([place]) 25/85/2778  . Uncontrolled type 2 diabetes mellitus with complication, with long-term current use of insulin ([place]) 04/20/2015  . Cellulitis of breast 12/29/2013  . Diabetes mellitus ([place]) 12/29/2013  . Cellulitis of female breast 12/29/2013  . Symptomatic menopausal or female climacteric states 04/08/2008  . OTHER SPECIFIED DISEASE OF NAIL 04/08/2008  . URINARY URGENCY 04/08/2008  . COUGH 04/02/2008  . HYPERTENSION, BENIGN ESSENTIAL 04/12/2007  . FATIGUE 04/12/2007  . OBESITY, NOS 07/21/2006  . Major depressive disorder, recurrent episode ([place]) 07/21/2006  . TOBACCO DEPENDENCE 07/21/2006  . GASTROESOPHAGEAL REFLUX, NO ESOPHAGITIS 07/21/2006  . INCONTINENCE, STRESS, FEMALE 07/21/2006    Past Surgical History:  Procedure Laterality Date  . CHOLECYSTECTOMY    . INCISION AND DRAINAGE ABSCESS Left 12/22/2012   Procedure: INCISION AND DRAINAGE ABSCESS;  Surgeon: Jamesetta So, MD;  Location: [facility];  Service: General;  Laterality: Left;  . TONSILLECTOMY    . TUBAL LIGATION       OB History   None      Home Medications    Prior to Admission medications   Medication Sig Start Date End Date Taking? Authorizing Provider  Blood Glucose Monitoring Suppl (ACCU-CHEK AVIVA PLUS) w/Device KIT 1 each by Does not apply route 3 (three) times daily. 06/02/17  Yes Fredia Beets  R, FNP  Insulin Pen Needle (PEN NEEDLES) 31G X 5 MM MISC 35 Units by Does not apply route daily. 08/26/17  Yes Charlott Rakes, MD  Lancet Devices [facility]) lancets Use as instructed 06/02/17  Yes [name], FNP  TRUEPLUS LANCETS 28G MISC Use as directed 05/30/17  Yes Hairston, Mandesia R, FNP  atorvastatin (LIPITOR) 40 MG tablet Take 1 tablet (40 mg total) by mouth daily. 08/15/17   Charlott Rakes, MD  buPROPion (WELLBUTRIN SR) 150 MG 12 hr tablet Take 1 tablet (150 mg total) by mouth 2 (two) times  daily. 05/30/17   [name], FNP  cetirizine (ZYRTEC) 10 MG tablet TAKE 1 TABLET (10 MG TOTAL) BY MOUTH DAILY. 09/19/17   Charlott Rakes, MD  gabapentin (NEURONTIN) 300 MG capsule Take 1 capsule (300 mg total) by mouth 2 (two) times daily. 08/15/17   Charlott Rakes, MD  glucose blood (ACCU-CHEK AVIVA) test strip Use as instructed 06/02/17   [name], FNP  HYDROcodone-acetaminophen (NORCO/VICODIN) 5-325 MG tablet Take one tab po q 4 hrs prn pain 07/27/17   Triplett, Tammy, PA-C  Insulin Glargine (LANTUS SOLOSTAR) 100 UNIT/ML Solostar Pen Inject 35 Units into the skin at bedtime. 06/28/17   Charlott Rakes, MD  lisinopril (PRINIVIL,ZESTRIL) 20 MG tablet Take 1 tablet (20 mg total) by mouth daily. 08/15/17   Charlott Rakes, MD  methocarbamol (ROBAXIN) 500 MG tablet Take 1 tablet (500 mg total) by mouth 3 (three) times daily. 08/15/17   Charlott Rakes, MD  metoCLOPramide (REGLAN) 10 MG tablet TAKE 1 TABLET BY MOUTH EVERY 8 (EIGHT) HOURS AS NEEDED FOR NAUSEA OR VOMITING. 09/19/17   Charlott Rakes, MD  omega-3 acid ethyl esters (LOVAZA) 1 g capsule Take 1 capsule (1 g total) by mouth 2 (two) times daily. 06/01/17   [name], FNP  Omega-3 Fatty Acids (FISH OIL PO) Take 1 capsule by mouth 2 (two) times daily.    [provider]  omeprazole (PRILOSEC) 20 MG capsule Take 1 capsule (20 mg total) by mouth daily. 08/15/17   Charlott Rakes, MD  sitaGLIPtin-metformin (JANUMET) 50-1000 MG tablet Take 1 tablet by mouth 2 (two) times daily with a meal. 08/15/17   Charlott Rakes, MD  traZODone (DESYREL) 50 MG tablet Take 1 tablet (50 mg total) by mouth at bedtime as needed for sleep. 08/15/17   Charlott Rakes, MD    Family History Family History  Problem Relation Age of Onset  . Hypertension Mother   . CVA Mother   . COPD Mother   . Heart disease Mother   . Kidney Stones Mother   . CAD Father   . Hypercholesterolemia Father   . Heart attack Father   . Cancer Maternal Uncle    . Diabetes Maternal Grandmother   . Cancer Maternal Grandfather        lung cancer    Social History Social History   Tobacco Use  . Smoking status: Current Every Day Smoker    Packs/day: 1.00    Years: 41.00    Pack years: 41.00    Types: Cigarettes  . Smokeless tobacco: Never Used  Substance Use Topics  . Alcohol use: No  . Drug use: No     Allergies   Cymbalta [duloxetine hcl]   Review of Systems Review of Systems  All other systems reviewed and are negative.    Physical Exam Updated Vital Signs BP (!) 169/110   Pulse 85   Temp 98.1 F (36.7 C) (Oral)  Resp 20   Ht '5\' 2"'  (1.575 m)   Wt 79.8 kg (176 lb)   SpO2 100%   BMI 32.19 kg/m   Physical Exam  Constitutional: She appears well-developed and well-nourished. No distress.  HENT:  Head: Normocephalic and atraumatic.  Mouth/Throat: Oropharynx is clear and moist. No oropharyngeal exudate.  Eyes: Pupils are equal, round, and reactive to light. Conjunctivae and EOM are normal. Right eye exhibits no discharge. Left eye exhibits no discharge. No scleral icterus.  Neck: Normal range of motion. Neck supple. No JVD present. No thyromegaly present.  Cardiovascular: Normal rate, regular rhythm, normal heart sounds and intact distal pulses. Exam reveals no gallop and no friction rub.  No murmur heard. Pulmonary/Chest: Effort normal and breath sounds normal. No respiratory distress. She has no wheezes. She has no rales.  Abdominal: Soft. Bowel sounds are normal. She exhibits no distension and no mass. There is no tenderness.  Musculoskeletal: Normal range of motion. She exhibits no edema or tenderness.  Lymphadenopathy:    She has no cervical adenopathy.  Neurological: She is alert. Coordination normal.  Skin: Skin is warm and dry. No rash noted. No erythema.  Psychiatric: She has a normal mood and affect. Her behavior is normal.  Nursing note and vitals reviewed.    ED Treatments / Results  Labs (all labs  ordered are listed, but only abnormal results are displayed) Labs Reviewed  BASIC METABOLIC PANEL - Abnormal; Notable for the following components:      Result Value   Glucose, Bld 345 (*)    All other components within normal limits  CBC  TROPONIN I    EKG EKG Interpretation  Date/Time:  Saturday October 29 2017 11:17:05 EDT Ventricular Rate:  79 PR Interval:  166 QRS Duration: 82 QT Interval:  378 QTC Calculation: 433 R Axis:   37 Text Interpretation:  Normal sinus rhythm Septal infarct , age undetermined Abnormal ECG Confirmed by Nat Christen [phone]) on 10/29/2017 12:32:04 PM   Radiology Dg Chest 2 View  Result Date: 10/29/2017 CLINICAL DATA:  Chest pain EXAM: CHEST - 2 VIEW COMPARISON:  July 14, 2016 FINDINGS: Lungs are clear. The heart size and pulmonary vascularity are normal. No pneumothorax. No adenopathy. No bone lesions. IMPRESSION: No edema or consolidation. Electronically Signed   By: Lowella Grip III M.D.   On: 10/29/2017 11:44    Procedures Procedures (including critical care time)  Medications Ordered in ED Medications  nitroGLYCERIN (NITROSTAT) SL tablet 0.4 mg (has no administration in time range)  aspirin chewable tablet 324 mg (has no administration in time range)     Initial Impression / Assessment and Plan / ED Course  I have reviewed the triage vital signs and the nursing notes.  Pertinent labs & imaging results that were available during my care of the patient were reviewed by me and considered in my medical decision making (see chart for details).    I have looked at the x-ray, it is normal, I have looked at her EKG and it to is unremarkable.  Her laboratory work-up shows hyperglycemia but no other acute findings including no acidosis, no elevation of troponin and normal renal function.  The story is very concerning and her heart score calculated at 5 giving her a major acute coronary event risk that is high enough that she needs to be admitted  to the hospital for further evaluation.  I do not think it would be prudent to discharge the patient to follow-up for her stress  test in more than a week when she is having active symptoms albeit no objective findings on exam or work-up.  Will discuss with the hospitalist for admission.  She will be given both aspirin and some nitroglycerin.  She is currently hypertensive at 170/110.  Troponin negative, blood counts and metabolic panel show only hyperglycemia, discussed with Dr. Manuella Ghazi of the hospitalist service who will admit for a cardiac work-up and rule out.  Final Clinical Impressions(s) / ED Diagnoses   Final diagnoses:  Acute chest pain      Noemi Chapel, MD 10/29/17 1411

## 2017-10-29 NOTE — ED Notes (Signed)
[facility] at bedside for transfer.  

## 2017-10-29 NOTE — ED Triage Notes (Signed)
Pt reports left arm pain all day yesterday with chest pain starting this morning.

## 2017-10-29 NOTE — ED Notes (Signed)
[facility] at bedside  

## 2017-10-29 NOTE — ED Notes (Signed)
Hospitalist at bedside 

## 2017-10-29 NOTE — ED Notes (Signed)
Called [facility] with bed assignment and for transport to [facility]. 

## 2017-10-30 ENCOUNTER — Observation Stay (HOSPITAL_COMMUNITY): Payer: Medicaid Other

## 2017-10-30 ENCOUNTER — Encounter (HOSPITAL_COMMUNITY): Payer: Self-pay | Admitting: Nurse Practitioner

## 2017-10-30 ENCOUNTER — Observation Stay (HOSPITAL_BASED_OUTPATIENT_CLINIC_OR_DEPARTMENT_OTHER): Payer: Medicaid Other

## 2017-10-30 ENCOUNTER — Other Ambulatory Visit: Payer: Self-pay

## 2017-10-30 DIAGNOSIS — E6609 Other obesity due to excess calories: Secondary | ICD-10-CM | POA: Diagnosis not present

## 2017-10-30 DIAGNOSIS — I10 Essential (primary) hypertension: Secondary | ICD-10-CM | POA: Diagnosis not present

## 2017-10-30 DIAGNOSIS — R079 Chest pain, unspecified: Secondary | ICD-10-CM | POA: Diagnosis not present

## 2017-10-30 DIAGNOSIS — I200 Unstable angina: Secondary | ICD-10-CM | POA: Diagnosis not present

## 2017-10-30 DIAGNOSIS — E1169 Type 2 diabetes mellitus with other specified complication: Secondary | ICD-10-CM

## 2017-10-30 DIAGNOSIS — F17200 Nicotine dependence, unspecified, uncomplicated: Secondary | ICD-10-CM

## 2017-10-30 DIAGNOSIS — E1165 Type 2 diabetes mellitus with hyperglycemia: Secondary | ICD-10-CM | POA: Diagnosis not present

## 2017-10-30 DIAGNOSIS — F329 Major depressive disorder, single episode, unspecified: Secondary | ICD-10-CM

## 2017-10-30 DIAGNOSIS — E785 Hyperlipidemia, unspecified: Secondary | ICD-10-CM

## 2017-10-30 DIAGNOSIS — R0789 Other chest pain: Secondary | ICD-10-CM | POA: Diagnosis not present

## 2017-10-30 DIAGNOSIS — I25110 Atherosclerotic heart disease of native coronary artery with unstable angina pectoris: Secondary | ICD-10-CM | POA: Diagnosis not present

## 2017-10-30 LAB — GLUCOSE, CAPILLARY
Glucose-Capillary: 199 mg/dL — ABNORMAL HIGH (ref 65–99)
Glucose-Capillary: 192 mg/dL — ABNORMAL HIGH (ref 65–99)
Glucose-Capillary: 235 mg/dL — ABNORMAL HIGH (ref 65–99)

## 2017-10-30 LAB — HEPARIN LEVEL (UNFRACTIONATED): Heparin Unfractionated: <0.1 IU/mL — ABNORMAL LOW (ref 0.30–0.70)

## 2017-10-30 LAB — ECHOCARDIOGRAM COMPLETE
Height: 63 in
Weight: 2860.8 oz

## 2017-10-30 LAB — HIV ANTIBODY (ROUTINE TESTING W REFLEX): HIV Screen 4th Generation wRfx: NONREACTIVE

## 2017-10-30 MED ORDER — IOPAMIDOL (ISOVUE-370) INJECTION 76%
INTRAVENOUS | Status: AC
  Administered 2017-10-30: 80 mL
  Filled 2017-10-30: qty 100

## 2017-10-30 MED ORDER — NITROGLYCERIN 0.4 MG SL SUBL
0.8000 mg | SUBLINGUAL_TABLET | Freq: Once | SUBLINGUAL | Status: AC
  Administered 2017-10-30: 0.8 mg via SUBLINGUAL

## 2017-10-30 MED ORDER — SODIUM CHLORIDE 0.9 % IV SOLN
INTRAVENOUS | Status: DC
  Administered 2017-10-30: 16:00:00 via INTRAVENOUS

## 2017-10-30 MED ORDER — AMLODIPINE BESYLATE 5 MG PO TABS: 5.0000 mg | ORAL_TABLET | Freq: Every day | ORAL | 0 refills | Status: DC

## 2017-10-30 MED ORDER — NICOTINE POLACRILEX 2 MG MT GUM: 2.0000 mg | CHEWING_GUM | OROMUCOSAL | 0 refills | Status: DC | PRN

## 2017-10-30 MED ORDER — HEPARIN BOLUS VIA INFUSION
2000.0000 [IU] | Freq: Once | INTRAVENOUS | Status: AC
  Administered 2017-10-30: 2000 [IU] via INTRAVENOUS
  Filled 2017-10-30: qty 2000

## 2017-10-30 MED ORDER — HEPARIN (PORCINE) IN NACL 100-0.45 UNIT/ML-% IJ SOLN
1000.0000 [IU]/h | INTRAMUSCULAR | Status: DC
  Administered 2017-10-30 (×2): 1000 [IU]/h via INTRAVENOUS
  Filled 2017-10-30: qty 250

## 2017-10-30 MED ORDER — NICOTINE 21 MG/24HR TD PT24: 21.0000 mg | MEDICATED_PATCH | TRANSDERMAL | 1 refills | Status: DC

## 2017-10-30 MED ORDER — METOPROLOL TARTRATE 25 MG PO TABS: 25.0000 mg | ORAL_TABLET | Freq: Two times a day (BID) | ORAL | 0 refills | Status: DC

## 2017-10-30 MED ORDER — NITROGLYCERIN 0.4 MG SL SUBL
SUBLINGUAL_TABLET | SUBLINGUAL | Status: AC
  Administered 2017-10-30: 0.8 mg via SUBLINGUAL
  Filled 2017-10-30: qty 2

## 2017-10-30 MED ORDER — ASPIRIN 81 MG PO TBEC: 81.0000 mg | DELAYED_RELEASE_TABLET | Freq: Every day | ORAL | 0 refills | Status: DC

## 2017-10-30 MED ORDER — AMLODIPINE BESYLATE 5 MG PO TABS
5.0000 mg | ORAL_TABLET | Freq: Every day | ORAL | Status: DC
  Administered 2017-10-30: 5 mg via ORAL
  Filled 2017-10-30: qty 1

## 2017-10-30 MED ORDER — NICOTINE POLACRILEX 2 MG MT GUM
2.0000 mg | CHEWING_GUM | OROMUCOSAL | Status: DC | PRN
Start: 2017-10-30 — End: 2017-10-30
  Filled 2017-10-30: qty 1

## 2017-10-30 NOTE — Progress Notes (Signed)
  Echocardiogram 2D Echocardiogram has been performed.  Delcie RochENNINGTON, Anysia Choi 10/30/2017, 12:14 PM

## 2017-10-30 NOTE — Progress Notes (Signed)
Pt may need c-pap order, pt has sleep apnea and has been off of her c-pap for 3 yrs, Thanks Glenna FellowsMike F RN.

## 2017-10-30 NOTE — Progress Notes (Signed)
Paged MD regarding norvasc order 1800

## 2017-10-30 NOTE — Progress Notes (Signed)
MD stated to give norvasc prior to discharge Pt educated on new medication

## 2017-10-30 NOTE — Progress Notes (Signed)
ANTICOAGULATION CONSULT NOTE - Initial Consult  Pharmacy Consult for Heparin Indication: chest pain/ACS  Allergies  Allergen Reactions  . Cymbalta [Duloxetine Hcl] Nausea Only    Nausea, lack of therapeutic effect    Patient Measurements: Height: 5\' 3"  (160 cm) Weight: 178 lb 12.8 oz (81.1 kg) IBW/kg (Calculated) : 52.4 HEPARIN DW (KG): 70.4  Vital Signs: Temp: 97.5 F (36.4 C) (06/09 0537) Temp Source: Oral (06/09 0537) BP: 146/81 (06/09 0537) Pulse Rate: 63 (06/09 0537)  Labs: Recent Labs    10/29/17 1151 10/29/17 1537 10/29/17 1543 10/30/17 0816  HGB 13.6  --   --   --   HCT 41.2  --   --   --   PLT 210  --   --   --   LABPROT  --   --  12.5  --   INR  --   --  0.94  --   HEPARINUNFRC  --   --   --  <0.10*  CREATININE 0.45  --   --   --   TROPONINI <0.03 <0.03  --   --     Estimated Creatinine Clearance: 78.3 mL/min (by C-G formula based on SCr of 0.45 mg/dL).   Medical History: Past Medical History:  Diagnosis Date  . Arthritis   . Coronary artery calcification seen on CT scan    a. 07/2017 noted on CT abd.  . Depression   . GERD (gastroesophageal reflux disease)   . Hyperlipidemia   . Hypertension   . Obesity   . Sleep apnea    a. Does not use CPAP "cause i dont think I need it anymore".  . Tobacco abuse   . Type II diabetes mellitus [facility](HCC)     Assessment: 57 yo female presented to ED with chest pain. Troponins are normal. Pharmacy consulted to dose heparin, no anticoagulation prior to admission. Heparin level this am is <0.10. Confirmed with nurse that infusion has been on all day and personally confirmed rate and heparin infusion running. Hemoglobin and platelets are wnl.   Goal of Therapy:  Heparin level 0.3-0.7 units/ml Monitor platelets by anticoagulation protocol: Yes   Plan:  Heparin bolus 2000 units x1  Increase heparin infusion to 1000 units/h  Check 6h heparin level Monitor for s/sx of bleeding   Blake DivineShannon Lanay Zinda, Pharm.D. PGY1  Pharmacy Resident 10/30/2017 11:20 AM Phone: [phone]

## 2017-10-30 NOTE — Consult Note (Addendum)
Cardiology Consult    Patient ID: Sarah Charles MRN: [id number], DOB/AGE: 57/11/1960   Admit date: 10/29/2017 Date of Consult: 10/30/2017  Primary Physician: Hoy RegisterNewlin, Enobong, MD PrimaryVernie Ammons Cardiologist: Will f/u in [place] Requesting Provider: A. Lowell GuitarPowell, MD  Patient Profile    Vernie AmmonsLaura Kueker is a 57 y.o. female with a history of HTN, HL, obesity, tob abuse, OSA, depression, and GERD, who is being seen today for the evaluation of chest pain at the request of Dr. Lowell GuitarPowell.  Past Medical History   Past Medical History:  Diagnosis Date  . Arthritis   . Coronary artery calcification seen on CT scan    a. 07/2017 noted on CT abd.  . Depression   . GERD (gastroesophageal reflux disease)   . Hyperlipidemia   . Hypertension   . Obesity   . Sleep apnea    a. Does not use CPAP "cause i dont think I need it anymore".  . Tobacco abuse   . Type II diabetes mellitus (HCC)     Past Surgical History:  Procedure Laterality Date  . CHOLECYSTECTOMY    . INCISION AND DRAINAGE ABSCESS Left 12/22/2012   Procedure: INCISION AND DRAINAGE ABSCESS;  Surgeon: Dalia HeadingMark A Jenkins, MD;  Location: [facility];  Service: General;  Laterality: Left;  . TONSILLECTOMY    . TUBAL LIGATION       Allergies  Allergies  Allergen Reactions  . Cymbalta [Duloxetine Hcl] Nausea Only    Nausea, lack of therapeutic effect    History of Present Illness    57 y/o ? with the above PMH including HTN, HL, DMII, obesity, tob abuse, depression, GERD, and OSA.  She has no prior cardiac hx but has a significant FH for premature CAD with her father dying of an MI @ 8054 and her mother suffering an MI @ 2657.  Pt was also incidentally noted to have coronary artery Ca2+ on CT of the abd earlier this year.  [place] lives in [place] with her husband and is unemployed.  She does not routinely exercise.  She continues to smoke a pack/day and has been smoking since age 57.  Her husband recently suffered an MI @ age 57, and as a result she recently saw her  PCP and was scheduled for a stress test (she says she was not having any symptoms - just wanted screening).  She was in her us[place] until the afternoon of 6/7, when she was a passenger in a car and had sudden onset of 4/10 left arm pain.  About an hour later, it moved into her chest and became associated with diaphoresis, which lasted about 20 mins and then resolved spontaneously.  Arm pain continued @ 4/10 throughout the remainder of 6/7 and was not worsened by position changes, palpation, or exertion.  Arm pain continued into 6/8 and then she had recurrent chest pain, again associated with diaphoresis, lasting about 15 mins, and resolving spontaneously.  Given recurrent symptoms, [facility].  There, ECG was non-acute, trop nl. CXR w/o acute dzs.  Pt was admitted to hospitalist service and subsequently [facility].  She has not had any recurrent cp, though arm pain persists.  Trop remain nl.  Inpatient Medications    . aspirin EC  81 mg Oral Daily  . atorvastatin  40 mg Oral q1800  . buPROPion  150 mg Oral BID  . gabapentin  300 mg Oral BID  . insulin aspart  0-20 Units Subcutaneous TID  WC  . insulin aspart  0-5 Units Subcutaneous QHS  . insulin glargine  35 Units Subcutaneous QHS  . lisinopril  20 mg Oral Daily  . loratadine  10 mg Oral Daily  . methocarbamol  500 mg Oral TID  . metoprolol tartrate  25 mg Oral BID  . nicotine  7 mg Transdermal Daily  . omega-3 acid ethyl esters  1 g Oral BID  . pantoprazole  40 mg Oral Daily  . sodium chloride flush  3 mL Intravenous Q12H  . vitamin C  500 mg Oral Daily    Family History    Family History  Problem Relation Age of Onset  . Hypertension Mother   . CVA Mother   . COPD Mother   . Heart disease Mother   . Kidney Stones Mother   . Heart attack Mother        MI in late 79's  . CAD Father   . Hypercholesterolemia Father   . Heart attack Father        Died @ 76 of MI  . Cancer Maternal Uncle   .  Diabetes Maternal Grandmother   . Cancer Maternal Grandfather        lung cancer  . Diabetes Sister    indicated that her mother is alive. She indicated that her father is deceased. She indicated that her sister is alive. She indicated that her maternal grandmother is deceased. She indicated that her maternal grandfather is deceased. She indicated that her maternal uncle is deceased.   Social History    Social History   Socioeconomic History  . Marital status: Married    Spouse name: Not on file  . Number of children: Not on file  . Years of education: Not on file  . Highest education level: Not on file  Occupational History  . Not on file  Social Needs  . Financial resource strain: Not on file  . Food insecurity:    Worry: Not on file    Inability: Not on file  . Transportation needs:    Medical: Not on file    Non-medical: Not on file  Tobacco Use  . Smoking status: Current Every Day Smoker    Packs/day: 1.00    Years: 41.00    Pack years: 41.00    Types: Cigarettes  . Smokeless tobacco: Never Used  Substance and Sexual Activity  . Alcohol use: No  . Drug use: No  . Sexual activity: Never    Birth control/protection: None  Lifestyle  . Physical activity:    Days per week: Not on file    Minutes per session: Not on file  . Stress: Not on file  Relationships  . Social connections:    Talks on phone: Not on file    Gets together: Not on file    Attends religious service: Not on file    Active member of club or organization: Not on file    Attends meetings of clubs or organizations: Not on file    Relationship status: Not on file  . Intimate partner violence:    Fear of current or ex partner: Not on file    Emotionally abused: Not on file    Physically abused: Not on file    Forced sexual activity: Not on file  Other Topics Concern  . Not on file  Social History Narrative   Lives in [place] with husband.  Unemployed.  Does not routinely exercise.      Review  of Systems    General:  +++ diaphoresis in setting of c/p on 6/7 and 6/8.  No chills, fever, night sweats or weight changes.  Cardiovascular:  +++ chest pain, no dyspnea on exertion, edema, orthopnea, palpitations, paroxysmal nocturnal dyspnea. Dermatological: No rash, lesions/masses Respiratory: No cough, dyspnea Urologic: No hematuria, dysuria Abdominal:   No nausea, vomiting, diarrhea, bright red blood per rectum, melena, or hematemesis Neurologic:  No visual changes, wkns, changes in mental status. MSK: Left arm pain - no change with palpation, internal/external rotation of shoulder, supination/pronation/flexion/extension of forearm. All other systems reviewed and are otherwise negative except as noted above.  Physical Exam    Blood pressure (!) 146/81, pulse 63, temperature (!) 97.5 F (36.4 C), temperature source Oral, resp. rate 18, height 5\' 3"  (1.6 m), weight 178 lb 12.8 oz (81.1 kg), SpO2 99 %.  General: Pleasant, NAD Psych: Normal affect. Neuro: Alert and oriented X 3. Moves all extremities spontaneously. HEENT: Normal  Neck: Supple without bruits or JVD. Lungs:  Resp regular and unlabored, CTA. Heart: RRR no s3, s4, or murmurs. Abdomen: Soft, non-tender, non-distended, BS + x 4.  Extremities: No clubbing, cyanosis or edema. DP/PT/Radials 2+ and equal bilaterally. Left arm non-tender with nl ROM.  No change in pain with ROM exercises.   Labs   Recent Labs    10/29/17 1151 10/29/17 1537  TROPONINI <0.03 <0.03   Lab Results  Component Value Date   WBC 7.0 10/29/2017   HGB 13.6 10/29/2017   HCT 41.2 10/29/2017   MCV 90.9 10/29/2017   PLT 210 10/29/2017    Recent Labs  Lab 10/29/17 1151  NA 138  K 4.1  CL 101  CO2 27  BUN 13  CREATININE 0.45  CALCIUM 9.2  GLUCOSE 345*   Lab Results  Component Value Date   CHOL 222 (H) 05/30/2017   HDL 33 (L) 05/30/2017   LDLCALC Comment 05/30/2017   TRIG 518 (H) 05/30/2017     Radiology Studies    Dg  Chest 2 View  Result Date: 10/29/2017 CLINICAL DATA:  Chest pain EXAM: CHEST - 2 VIEW COMPARISON:  July 14, 2016 FINDINGS: Lungs are clear. The heart size and pulmonary vascularity are normal. No pneumothorax. No adenopathy. No bone lesions. IMPRESSION: No edema or consolidation. Electronically Signed   By: Bretta Bang III M.D.   On: 10/29/2017 11:44    ECG & Cardiac Imaging    RSR, 60, ? Prior septal infarct. - similar to 10/2014 ecg.  Assessment & Plan    1.  Chest and Arm pain: Pt without prior cardiac history, though w/ multiple risk factors including HTN, HL, DMII, obesity, ongoing tob abuse, and significant FH of premature CAD in both parents with father dying of MI @ 67 and mother suffering MI @ 41.  She was admitted to [facility] on 6/8 with a 2 day h/o constant 4/10 left arm pain that does not change with position changes or palpation, and 2 episodes of sscp associated with diaphoresis, each lasting ~ 20 mins, and resolving spontaneously.  At [facility], ECG and trops nl.  CXR w/o acute findings.  She was tx to [facility] for further eval.  Ongoing left arm pain but no recurrent c/p.  Cont asa, statin,  blocker, heparin (for now).  I will arrange for cardiac CT angio w/ FFR this AM.  Pt agreeable.  2.  Essential HTN:  BP elevated this AM.  F/u after AM meds.  3.  HL/HTG:  TC 222  w/ TG 518 in January.  On statin.  Needs more aggressive mgmt of TG/improved DM mgmt.  4.  Type II DM:  A1c 8.6 on 6/8.  Per IM.  5.  Tob Abuse:  Cessation advised.  6.  ECG Error:  Of note, upon reviewing pts chart, I came across a 12 lead from 01/19/2017, timed 11:33.  This ecg clearly shows afib @ a rate of 143 w/ a repolarization abnormality.  Whereas Ms. Maietta's prior/current ECG's show delayed R progression/? Septal infarct, this ECG has a more nl R wave progression.  I reviewed ER notes and VS from that day.  Other than a templated ECG report in the MD note, there is no mention of the pt having Afib (there w/  abd/flank pain) and VS reflect normal HR's and BP's.  Pt has no knowledge of ever having Afib. Also, based on timing of CT and Abd U/S, it appears pt was d/c'd prior to this ECG being performed. Given all of these findings, it appears that her name and medical record were inadvertently placed on another person's ECG and then scanned into her chart.   Signed, Nicolasa Ducking, NP 10/30/2017, 9:06 AM For questions or updates, please contact   Please consult [email or web address] for contact info under Cardiology/STEMI.  The patient was seen, examined and discussed with Saralyn Pilar, NP and agree as above.  57 y/o ? with the above h/o HTN, HL, DMII, obesity, tob abuse, depression, GERD, OSA and family history of premature coronary artery disease (father died of myocardial infarction at age of 71, mother had myocardial infarction in her 63s however is still alive currently 57 years old).  Pt was also incidentally noted to have coronary artery Ca2+ on CT of the abd earlier this year.   She does not routinely exercise.  She continues to smoke a pack/day and has been smoking since age 54.   She developed chest pain while driving as a passenger in a car, the pain was radiating to her left arm, she states she had previous episodes of this type of pain in the past however they were not so intense.  Her pain progressed into diaphoresis, there were no other associated symptoms such as shortness of breath palpitations or dizziness. She is currently chest pain-free.  Her EKG is normal, troponin negative x3. Physical exam shows no JVD, regular rate and rhythm, no murmurs, lungs are clear, there is lower extremity edema, extremities are warm and have good pulses. Given all the risk factors, we will proceed with coronary CTA.  Tobias Alexander, MD 10/30/2017

## 2017-10-30 NOTE — Progress Notes (Signed)
PROGRESS NOTE    Sarah Charles  WUJ:[id number] DOB: December 12, 1960 DOA: 10/29/2017 PCP: [name], MD    Brief Narrative:  Sarah Charles is Sarah Charles 57 y.o. female with medical history significant for depression, type 2 diabetes, dyslipidemia, hypertension, obesity, tobacco abuse, and GERD who presented to the emergency department with several episodes of substernal chest pressure with radiation to her left arm.  She states that her initial episode began yesterday evening as she was driving back from dropping off Sarah Charles friend in [place].  The episode resolved spontaneously after approximately 10 minutes and then reoccurred earlier this morning for about the same duration and again resolved spontaneously.  However, on her repeat episode she had significant diaphoresis as well as some nausea and shortness of breath and was quite concerning for her.  She has never been diagnosed with any heart disease and states that she has Sarah Charles significant family history with her father who died of Sarah Charles heart attack at the age of 57. She denies any recent cough, lower extremity edema, orthopnea, paroxysmal nocturnal dyspnea, fever, or chills.  Assessment & Plan:   Principal Problem:   Unstable angina (HCC) Active Problems:   OBESITY, NOS   TOBACCO DEPENDENCE   GASTROESOPHAGEAL REFLUX, NO ESOPHAGITIS   Depression   Hypertension   Type 2 diabetes mellitus with hyperlipidemia (HCC)   Acute chest pain   1. Suspected Unstable angina.  Patient currently has many risk factors with heart score of 5.  Discussed with Dr. Delton See by admitting provider who recommended heparin, cycle troponin, and repeat AM EKG. 1. Appreciate cardiology assistance - plan for coronary CTA 2. EKG appears stable without obvious ischemic changes (?q in V1-2).  Troponins negative. 3. Continue aspirin and metoprolol.   4. Nitro prn.  5. Echo with normal EF 6. A1c, lipid panel  2. Type 2 diabetes with hyperglycemia.  Hold home oral agents and maintain on  Lantus as well as sliding scale insulin.  Diet heart healthy/carb modified.  3. Hypertension.  Maintain on lisinopril and add metoprolol as noted above.  4. Depression.  Maintain on Wellbutrin, with trazodone for insomnia.  5. GERD.  Maintain on PPI.  6. Tobacco abuse.  Counseled on cessation and will provide nicotine patch.  DVT prophylaxis: heparin Code Status: full  Family Communication: husband in room, but did not awaken for conversation Disposition Plan: pending cards   Consultants:   cardiology  Procedures:  Study Conclusions  - Left ventricle: The cavity size was normal. Systolic function was   normal. The estimated ejection fraction was in the range of 55%   to 60%.  Antimicrobials:  Anti-infectives (From admission, onward)   None     Subjective: Had CP that was constant and heavy in her chest and L arm.   Better now.  Objective: Vitals:   10/30/17 0010 10/30/17 0024 10/30/17 0537 10/30/17 1300  BP:  (!) 160/80 (!) 146/81 (!) 151/91  Pulse: 67 67 63 65  Resp:  18 18 20   Temp:  97.6 F (36.4 C) (!) 97.5 F (36.4 C) 97.8 F (36.6 C)  TempSrc:  Oral Oral Oral  SpO2: 94% 94% 99% 95%  Weight:   81.1 kg (178 lb 12.8 oz)   Height:        Intake/Output Summary (Last 24 hours) at 10/30/2017 1651 Last data filed at 10/30/2017 1500 Gross per 24 hour  Intake 237.31 ml  Output 2200 ml  Net -1962.69 ml   Filed Weights   10/29/17 1113 10/29/17 2054  10/30/17 0537  Weight: 79.8 kg (176 lb) 81.8 kg (180 lb 6.4 oz) 81.1 kg (178 lb 12.8 oz)    Examination:  General exam: Appears calm and comfortable  Respiratory system: Clear to auscultation. Respiratory effort normal. Cardiovascular system: S1 & S2 heard, RRR. No JVD, murmurs, rubs, gallops or clicks. No pedal edema. Gastrointestinal system: Abdomen is nondistended, soft and nontender. No organomegaly or masses felt. Normal bowel sounds heard. Central nervous system: Alert and oriented. No focal neurological  deficits. Extremities: Symmetric 5 x 5 power. Skin: No rashes, lesions or ulcers Psychiatry: Judgement and insight appear normal. Mood & affect appropriate.     Data Reviewed: I have personally reviewed following labs and imaging studies  CBC: Recent Labs  Lab 10/29/17 1151  WBC 7.0  HGB 13.6  HCT 41.2  MCV 90.9  PLT 210   Basic Metabolic Panel: Recent Labs  Lab 10/29/17 1151  NA 138  K 4.1  CL 101  CO2 27  GLUCOSE 345*  BUN 13  CREATININE 0.45  CALCIUM 9.2   GFR: Estimated Creatinine Clearance: 78.3 mL/min (by C-G formula based on SCr of 0.45 mg/dL). Liver Function Tests: No results for input(s): AST, ALT, ALKPHOS, BILITOT, PROT, ALBUMIN in the last 168 hours. No results for input(s): LIPASE, AMYLASE in the last 168 hours. No results for input(s): AMMONIA in the last 168 hours. Coagulation Profile: Recent Labs  Lab 10/29/17 1543  INR 0.94   Cardiac Enzymes: Recent Labs  Lab 10/29/17 1151 10/29/17 1537  TROPONINI <0.03 <0.03   BNP (last 3 results) No results for input(s): PROBNP in the last 8760 hours. HbA1C: Recent Labs    10/29/17 1537  HGBA1C 8.6*   CBG: Recent Labs  Lab 10/29/17 1754 10/29/17 2103 10/30/17 0722 10/30/17 1311  GLUCAP 210* 112* 199* 192*   Lipid Profile: No results for input(s): CHOL, HDL, LDLCALC, TRIG, CHOLHDL, LDLDIRECT in the last 72 hours. Thyroid Function Tests: Recent Labs    10/29/17 1537  TSH 0.965   Anemia Panel: No results for input(s): VITAMINB12, FOLATE, FERRITIN, TIBC, IRON, RETICCTPCT in the last 72 hours. Sepsis Labs: No results for input(s): PROCALCITON, LATICACIDVEN in the last 168 hours.  No results found for this or any previous visit (from the past 240 hour(s)).       Radiology Studies: Dg Chest 2 View  Result Date: 10/29/2017 CLINICAL DATA:  Chest pain EXAM: CHEST - 2 VIEW COMPARISON:  July 14, 2016 FINDINGS: Lungs are clear. The heart size and pulmonary vascularity are normal. No  pneumothorax. No adenopathy. No bone lesions. IMPRESSION: No edema or consolidation. Electronically Signed   By: Bretta Bang III M.D.   On: 10/29/2017 11:44   Ct Coronary Morph W/cta Cor W/score W/ca W/cm &/or Wo/cm  Result Date: 10/30/2017 CLINICAL DATA:  57 year old female with h/o smoking, diabetes, hypertension, atypical chest pain and family h/o premature CAD. EXAM: Cardiac/Coronary  CT TECHNIQUE: The patient was scanned on [employer]. FINDINGS: Jullian Clayson 120 kV prospective scan was triggered in the descending thoracic aorta at 111 HU's. Axial non-contrast 3 mm slices were carried out through the heart. The data set was analyzed on Starletta Houchin dedicated work station and scored using the Agatson method. Gantry rotation speed was 250 msecs and collimation was .6 mm. No beta blockade and 0.8 mg of sl NTG was given. The 3D data set was reconstructed in 5% intervals of the 67-82 % of the R-R cycle. Diastolic phases were analyzed on Garnie Borchardt dedicated work station  using MPR, MIP and VRT modes. The patient received 80 cc of contrast. Aorta:  Normal size.  No calcifications.  No dissection. Aortic Valve:  Trileaflet.  No calcifications. Coronary Arteries:  Normal coronary origin.  Right dominance. RCA is Doll Frazee large dominant artery that gives rise to PDA and PLVB. There is mild mixed plaque in the proximal and mid RCA with maximum stenosis 25-50%. Left main is Lamia Mariner large artery that gives rise to LAD and LCX arteries. Left main has no plaque. LAD is Anais Denslow large vessel that gives rise to one diagonal artery. There is mild diffuse calcified plaque in the proximal LAD with associated stenosis 25-50%. Mid LAD has moderate predominantly calcified plaque with associated stenosis 50-69%. Mid to distal LAD has no significant plaque. D1 is medium size artery with mild calcified plaque. LCX is Shaelin Lalley non-dominant artery that gives rise to one medium size OM1 branch. There is minimal plaque. Other findings: Normal pulmonary vein drainage into the  left atrium. Normal let atrial appendage without Orvis Stann thrombus. Normal size of the pulmonary artery. IMPRESSION: 1. Coronary calcium score of 296. This was 5998 percentile for age and sex matched control. 2. Normal coronary origin with right dominance. 3. Mild CAD in the proximal andmid RCA and proximal LAD. Moderate CAD with stenosis 50-69% in the mid LAD. Additional analysis with CT FFR will be submitted. Aggressive risk factors modification should be initiated. Electronically Signed   By: Tobias AlexanderKatarina  Nelson   On: 10/30/2017 13:55        Scheduled Meds: . aspirin EC  81 mg Oral Daily  . atorvastatin  40 mg Oral q1800  . buPROPion  150 mg Oral BID  . gabapentin  300 mg Oral BID  . insulin aspart  0-20 Units Subcutaneous TID WC  . insulin aspart  0-5 Units Subcutaneous QHS  . insulin glargine  35 Units Subcutaneous QHS  . lisinopril  20 mg Oral Daily  . loratadine  10 mg Oral Daily  . methocarbamol  500 mg Oral TID  . metoprolol tartrate  25 mg Oral BID  . nicotine  7 mg Transdermal Daily  . omega-3 acid ethyl esters  1 g Oral BID  . pantoprazole  40 mg Oral Daily  . sodium chloride flush  3 mL Intravenous Q12H  . vitamin C  500 mg Oral Daily   Continuous Infusions: . sodium chloride    . sodium chloride 75 mL/hr at 10/30/17 1549  . heparin 1,000 Units/hr (10/30/17 1305)     LOS: 0 days    Time spent: over 30 min    Lacretia Nicksaldwell Powell, MD [facility] Pager [phone]  If 7PM-7AM, please contact night-coverage [email or web address] Password [email or web address] 10/30/2017, 4:51 PM

## 2017-10-30 NOTE — Discharge Summary (Signed)
Physician Discharge Summary  Anthea Udovich OAC:[id number] DOB: Aug 10, 1960 DOA: 10/29/2017  PCP: Charlott Rakes, MD  Admit date: 10/29/2017 Discharge date: 10/30/2017  Time spent: 40 minutes  Recommendations for Outpatient Follow-up:  1. Follow up outpatient CBC/CMP 2. Follow up with cardiology as outpatient.   3. Discussed with cardiology prior to discharge.  Fractional flow reserve pending at time of discharge.  They noted ok to discharge and planning to call patient if any concerning findings for fractional flow reserve. 4. Continue to follow BP.  Started on metoprolol here.  Added amlodipine as well. 5. Aspirin started daily as well 6. Follow repeat A1c and lipids outpatient.  Continue to follow HTN, HLD, and DM 7. Encouraged smoking cessation, which she was open to quitting.  Discharged with nicotine patch and gum.   Discharge Diagnoses:  Principal Problem:   Unstable angina (HCC) Active Problems:   OBESITY, NOS   TOBACCO DEPENDENCE   GASTROESOPHAGEAL REFLUX, NO ESOPHAGITIS   Depression   Hypertension   Type 2 diabetes mellitus with hyperlipidemia (HCC)   Acute chest pain   Discharge Condition: stable  Diet recommendation: heart healhy  Filed Weights   10/29/17 1113 10/29/17 2054 10/30/17 0537  Weight: 79.8 kg (176 lb) 81.8 kg (180 lb 6.4 oz) 81.1 kg (178 lb 12.8 oz)    History of present illness:  Anber Mckiver Venie Montesinos 57 y.o.femalewith medical history significant fordepression, type 2 diabetes,dyslipidemia, hypertension, obesity, tobacco abuse, and GERD who presented to the emergency department with several episodes of substernal chest pressure with radiation to her left arm. She states that her initial episode began yesterday evening as she was driving back from dropping off Kaveon Blatz friend in [place]. The episode resolved spontaneously after approximately 10 minutes and then reoccurred earlier this morning for aboutthe same duration and again resolved spontaneously. However, on  her repeat episode she had significant diaphoresis as well as some nausea and shortness of breath and was quite concerning for her. She has never been diagnosed with any heart disease and states that she has Victorious Cosio significant family history with her father who died of Lacreasha Hinds heart attack at the age of 25. She denies any recent cough, lower extremity edema,orthopnea,paroxysmal nocturnal dyspnea, fever, or chills.  She ruled out for ACS with negative troponin and EKG.  She was seen by cardiology who recommended Leoma Folds coronary CTA which showed moderate CAD with stenosis 50-59% in mid LAD.  FFR submitted and pending at time of discharge, but case was discussed with cardiology who noted it was ok to discharge pt and they'd follow up if FFR concerning.   Hospital Course:  1. Chest Pain. Patient currently has many risk factors with heart score of 5.  Discussed with Dr. Meda Coffee by admitting provider who recommended heparin, cycle troponin, and repeat AM EKG. 1. Appreciate cardiology assistance - plan for coronary CTA - notable for coronary calcium score of 296.  Mild CAD in proximal and mid RCA and proximal LAD.  Moderate CAD with stenosis 50-69% in the mid LAD.  Additional analysis with CT FFR was submitted and is pending.  I discussed with cardiology who noted the patient was ok to discharge and they would follow up with them as an outpatient if FFR concerning.   2. EKG appears stable without obvious ischemic changes (?q in V1-2).  Troponins negative. 3. Continue aspirin and metoprolol.   4. Nitro prn.  5. Echo with normal EF (see report)  2. Type 2 diabetes with hyperglycemia. Resume home agents.  3.  Hypertension. Add amlodipine.  Add metoprolol.  Continue lisinopril.  4. Depression. Maintain on Wellbutrin,with trazodone for insomnia.  5. GERD. Maintain on PPI.  6. Tobacco abuse. Cessation counseling provided.  She's interested in quitting.   Procedures: Echo Study Conclusions  - Left  ventricle: The cavity size was normal. Systolic function was   normal. The estimated ejection fraction was in the range of 55%   to 60%.   Consultations:  cardiology  Discharge Exam: Vitals:   10/30/17 0540 10/30/17 1300  BP: (!) 146/81 (!) 151/91  Pulse: 63 65  Resp:  20  Temp:  97.8 F (36.6 C)  SpO2:  95%   CP resolved. Would like to quit smoking. Asking about healthy diet and whats recommended with CAD.  General: No acute distress. Cardiovascular: Heart sounds show Kaelei Wheeler regular rate, and rhythm. No gallops or rubs. No murmurs. No JVD. Lungs: Clear to auscultation bilaterally with good air movement. No rales, rhonchi or wheezes. Abdomen: Soft, nontender, nondistended with normal active bowel sounds. No masses. No hepatosplenomegaly. Neurological: Alert and oriented 3. Moves all extremities 4. Cranial nerves II through XII grossly intact. Skin: Warm and dry. No rashes or lesions. Extremities: No clubbing or cyanosis. No edema. Pedal pulses 2+. Psychiatric: Mood and affect are normal. Insight and judgment are appropriate.   Discharge Instructions   Discharge Instructions    Call MD for:  difficulty breathing, headache or visual disturbances   Complete by:  As directed    Call MD for:  extreme fatigue   Complete by:  As directed    Call MD for:  persistant dizziness or light-headedness   Complete by:  As directed    Call MD for:  persistant nausea and vomiting   Complete by:  As directed    Call MD for:  redness, tenderness, or signs of infection (pain, swelling, redness, odor or green/yellow discharge around incision site)   Complete by:  As directed    Call MD for:  severe uncontrolled pain   Complete by:  As directed    Call MD for:  temperature >100.4   Complete by:  As directed    Diet - low sodium heart healthy   Complete by:  As directed    Diet - low sodium heart healthy   Complete by:  As directed    Discharge instructions   Complete by:  As directed     You were admitted for chest pain.   Your EKG and troponins were reassuring.  Your CT scan showed coronary artery disease, but additional analysis is underway at this point in time.  I spoke with cardiology who noted it would be okay to discharge you and they would call you if there are concerning findings on the follow up studies.  We've started you on aspirin daily as well as metoprolol.  I also started you on amlodipine for your blood pressure.  Control of you cholesterol, high blood pressure, and diabetes will be extremely important.  Please follow up with your PCP regarding this.  Stopping smoking is also very important.    Return if you have new, recurrent, or worsening symptoms.  Please ask your PCP to request records from this hospitalization so they know what was done and what the next steps will be.   Increase activity slowly   Complete by:  As directed    Increase activity slowly   Complete by:  As directed      Allergies as of 10/30/2017  Reactions   Cymbalta [duloxetine Hcl] Nausea Only   Nausea, lack of therapeutic effect      Medication List    TAKE these medications   ACCU-CHEK AVIVA PLUS w/Device Kit 1 each by Does not apply route 3 (three) times daily.   accu-chek softclix lancets Use as instructed   amLODipine 5 MG tablet Commonly known as:  NORVASC Take 1 tablet (5 mg total) by mouth daily.   aspirin 81 MG EC tablet Take 1 tablet (81 mg total) by mouth daily. Start taking on:  10/31/2017   atorvastatin 40 MG tablet Commonly known as:  LIPITOR Take 1 tablet (40 mg total) by mouth daily.   buPROPion 150 MG 12 hr tablet Commonly known as:  WELLBUTRIN SR Take 1 tablet (150 mg total) by mouth 2 (two) times daily.   cetirizine 10 MG tablet Commonly known as:  ZYRTEC TAKE 1 TABLET (10 MG TOTAL) BY MOUTH DAILY.   gabapentin 300 MG capsule Commonly known as:  NEURONTIN Take 1 capsule (300 mg total) by mouth 2 (two) times daily.   glucose blood  test strip Commonly known as:  ACCU-CHEK AVIVA Use as instructed   HYDROcodone-acetaminophen 5-325 MG tablet Commonly known as:  NORCO/VICODIN Take one tab po q 4 hrs prn pain What changed:    how much to take  how to take this  when to take this  reasons to take this  additional instructions   Insulin Glargine 100 UNIT/ML Solostar Pen Commonly known as:  LANTUS SOLOSTAR Inject 35 Units into the skin at bedtime.   lisinopril 20 MG tablet Commonly known as:  PRINIVIL,ZESTRIL Take 1 tablet (20 mg total) by mouth daily.   methocarbamol 500 MG tablet Commonly known as:  ROBAXIN Take 1 tablet (500 mg total) by mouth 3 (three) times daily.   metoCLOPramide 10 MG tablet Commonly known as:  REGLAN TAKE 1 TABLET BY MOUTH EVERY 8 (EIGHT) HOURS AS NEEDED FOR NAUSEA OR VOMITING.   metoprolol tartrate 25 MG tablet Commonly known as:  LOPRESSOR Take 1 tablet (25 mg total) by mouth 2 (two) times daily.   nicotine 21 mg/24hr patch Commonly known as:  NICODERM CQ - dosed in mg/24 hours Place 1 patch (21 mg total) onto the skin daily.   nicotine polacrilex 2 MG gum Commonly known as:  NICORETTE Take 1 each (2 mg total) by mouth as needed for smoking cessation.   omega-3 acid ethyl esters 1 g capsule Commonly known as:  LOVAZA Take 1 capsule (1 g total) by mouth 2 (two) times daily.   omeprazole 20 MG capsule Commonly known as:  PRILOSEC Take 1 capsule (20 mg total) by mouth daily.   Pen Needles 31G X 5 MM Misc 35 Units by Does not apply route daily.   sitaGLIPtin-metformin 50-1000 MG tablet Commonly known as:  JANUMET Take 1 tablet by mouth 2 (two) times daily with Janari Yamada meal.   traZODone 50 MG tablet Commonly known as:  DESYREL Take 1 tablet (50 mg total) by mouth at bedtime as needed for sleep.   TRUEPLUS LANCETS 28G Misc Use as directed   vitamin C 500 MG tablet Commonly known as:  ASCORBIC ACID Take 500 mg by mouth daily.      Allergies  Allergen Reactions   . Cymbalta [Duloxetine Hcl] Nausea Only    Nausea, lack of therapeutic effect   Follow-up Information    Charlott Rakes, MD Follow up.   Specialty:  Family Medicine Contact information: [address]  [place] [address] [phone]        Dorothy Spark, MD Follow up.   Specialty:  Cardiology Why:  we will contact you for follow up Contact information: [address] [place] [address] [phone]            The results of significant diagnostics from this hospitalization (including imaging, microbiology, ancillary and laboratory) are listed below for reference.    Significant Diagnostic Studies: Dg Chest 2 View  Result Date: 10/29/2017 CLINICAL DATA:  Chest pain EXAM: CHEST - 2 VIEW COMPARISON:  July 14, 2016 FINDINGS: Lungs are clear. The heart size and pulmonary vascularity are normal. No pneumothorax. No adenopathy. No bone lesions. IMPRESSION: No edema or consolidation. Electronically Signed   By: Lowella Grip III M.D.   On: 10/29/2017 11:44   Ct Coronary Morph W/cta Cor W/score W/ca W/cm &/or Wo/cm  Result Date: 10/30/2017 CLINICAL DATA:  57 year old female with h/o smoking, diabetes, hypertension, atypical chest pain and family h/o premature CAD. EXAM: Cardiac/Coronary  CT TECHNIQUE: The patient was scanned on [employer]. FINDINGS: Danaysia Rader 120 kV prospective scan was triggered in the descending thoracic aorta at 111 HU's. Axial non-contrast 3 mm slices were carried out through the heart. The data set was analyzed on Cosette Prindle dedicated work station and scored using the [place]. Gantry rotation speed was 250 msecs and collimation was .6 mm. No beta blockade and 0.8 mg of sl NTG was given. The 3D data set was reconstructed in 5% intervals of the 67-82 % of the R-R cycle. Diastolic phases were analyzed on Baylei Siebels dedicated work station using MPR, MIP and VRT modes. The patient received 80 cc of contrast. Aorta:  Normal size.  No  calcifications.  No dissection. Aortic Valve:  Trileaflet.  No calcifications. Coronary Arteries:  Normal coronary origin.  Right dominance. RCA is Keymani Mclean large dominant artery that gives rise to PDA and PLVB. There is mild mixed plaque in the proximal and mid RCA with maximum stenosis 25-50%. Left main is Keatyn Jawad large artery that gives rise to LAD and LCX arteries. Left main has no plaque. LAD is Dustine Stickler large vessel that gives rise to one diagonal artery. There is mild diffuse calcified plaque in the proximal LAD with associated stenosis 25-50%. Mid LAD has moderate predominantly calcified plaque with associated stenosis 50-69%. Mid to distal LAD has no significant plaque. D1 is medium size artery with mild calcified plaque. LCX is Danton Palmateer non-dominant artery that gives rise to one medium size OM1 branch. There is minimal plaque. Other findings: Normal pulmonary vein drainage into the left atrium. Normal let atrial appendage without Kathelyn Gombos thrombus. Normal size of the pulmonary artery. IMPRESSION: 1. Coronary calcium score of 296. This was 41 percentile for age and sex matched control. 2. Normal coronary origin with right dominance. 3. Mild CAD in the proximal andmid RCA and proximal LAD. Moderate CAD with stenosis 50-69% in the mid LAD. Additional analysis with CT FFR will be submitted. Aggressive risk factors modification should be initiated. Electronically Signed   By: Ena Dawley   On: 10/30/2017 13:55    Microbiology: No results found for this or any previous visit (from the past 240 hour(s)).   Labs: Basic Metabolic Panel: Recent Labs  Lab 10/29/17 1151  NA 138  K 4.1  CL 101  CO2 27  GLUCOSE 345*  BUN 13  CREATININE 0.45  CALCIUM 9.2   Liver Function Tests: No results for input(s): AST,  ALT, ALKPHOS, BILITOT, PROT, ALBUMIN in the last 168 hours. No results for input(s): LIPASE, AMYLASE in the last 168 hours. No results for input(s): AMMONIA in the last 168 hours. CBC: Recent Labs  Lab 10/29/17 1151   WBC 7.0  HGB 13.6  HCT 41.2  MCV 90.9  PLT 210   Cardiac Enzymes: Recent Labs  Lab 10/29/17 1151 10/29/17 1537  TROPONINI <0.03 <0.03   BNP: BNP (last 3 results) No results for input(s): BNP in the last 8760 hours.  ProBNP (last 3 results) No results for input(s): PROBNP in the last 8760 hours.  CBG: Recent Labs  Lab 10/29/17 1754 10/29/17 2103 10/30/17 0722 10/30/17 1311 10/30/17 1646  GLUCAP 210* 112* 199* 192* 235*       Signed:  Fayrene Helper MD.  [facility] 10/30/2017, 5:49 PM

## 2017-10-30 NOTE — Progress Notes (Signed)
Pt states medications are too expensive for her to pick up MD aware and printed good rx coupons  Pt 3 IVs discontinued, catheter intact and telemetry removed. Pt has all belongings including home medications. Pt discharged via wheelchair with nurse staff.

## 2017-11-01 ENCOUNTER — Telehealth: Payer: Self-pay

## 2017-11-01 NOTE — Telephone Encounter (Signed)
Per Dr. Delton SeeNelson,  "This patient was in the hospital this past weekend for chest pain, I read the CT as moderate CAD, however CT FFR showed significant stenosis in the mid LAD, she was discharged before we got the results.  Could you bring her to the clinic to schedule a cath or just schedule a cath if she agrees to it?"  Called to arrange follow-up tomorrow. Appointment with Lawson FiscalLori placed on hold.  Left message to call back.

## 2017-11-01 NOTE — Telephone Encounter (Signed)
-----   Message from Lars MassonKatarina H Nelson, MD sent at 10/31/2017  8:17 PM EDT ----- Regarding: Cardiac cath Hi! This patient was in the hospital this past weekend for chest pain, I read the CT as moderate CAD, however CT FFR showed significant stenosis in the mid LAD, she was discharged before we got the results. Could you bring her to the clinic to schedule a cath or just schedule a cath if she agrees to it? Thank you, Aris LotKatarina

## 2017-11-02 ENCOUNTER — Telehealth: Payer: Self-pay | Admitting: *Deleted

## 2017-11-02 ENCOUNTER — Encounter: Payer: Self-pay | Admitting: Nurse Practitioner

## 2017-11-02 ENCOUNTER — Ambulatory Visit: Payer: Medicaid Other | Admitting: Nurse Practitioner

## 2017-11-02 VITALS — BP 150/90 | HR 70 | Ht 62.0 in | Wt 179.8 lb

## 2017-11-02 DIAGNOSIS — Z720 Tobacco use: Secondary | ICD-10-CM

## 2017-11-02 DIAGNOSIS — R079 Chest pain, unspecified: Secondary | ICD-10-CM

## 2017-11-02 DIAGNOSIS — R9389 Abnormal findings on diagnostic imaging of other specified body structures: Secondary | ICD-10-CM

## 2017-11-02 LAB — CBC WITH DIFFERENTIAL/PLATELET
Basophils Absolute: 0.1 10*3/uL (ref 0.0–0.2)
Basos: 1 %
EOS (ABSOLUTE): 0.2 10*3/uL (ref 0.0–0.4)
Eos: 1 %
Hematocrit: 42.1 % (ref 34.0–46.6)
Hemoglobin: 14.5 g/dL (ref 11.1–15.9)
Immature Grans (Abs): 0 10*3/uL (ref 0.0–0.1)
Immature Granulocytes: 0 %
Lymphocytes Absolute: 3.6 10*3/uL — ABNORMAL HIGH (ref 0.7–3.1)
Lymphs: 34 %
MCH: 30.8 pg (ref 26.6–33.0)
MCHC: 34.4 g/dL (ref 31.5–35.7)
MCV: 89 fL (ref 79–97)
Monocytes Absolute: 0.8 10*3/uL (ref 0.1–0.9)
Monocytes: 7 %
Neutrophils Absolute: 6 10*3/uL (ref 1.4–7.0)
Neutrophils: 57 %
Platelets: 277 10*3/uL (ref 150–450)
RBC: 4.71 x10E6/uL (ref 3.77–5.28)
RDW: 13.6 % (ref 12.3–15.4)
WBC: 10.6 10*3/uL (ref 3.4–10.8)

## 2017-11-02 LAB — COMPREHENSIVE METABOLIC PANEL
ALT: 15 IU/L (ref 0–32)
AST: 12 IU/L (ref 0–40)
Albumin/Globulin Ratio: 1.8 (ref 1.2–2.2)
Albumin: 4.4 g/dL (ref 3.5–5.5)
Alkaline Phosphatase: 147 IU/L — ABNORMAL HIGH (ref 39–117)
BUN/Creatinine Ratio: 41 — ABNORMAL HIGH (ref 9–23)
BUN: 19 mg/dL (ref 6–24)
Bilirubin Total: 0.2 mg/dL (ref 0.0–1.2)
CO2: 24 mmol/L (ref 20–29)
Calcium: 10 mg/dL (ref 8.7–10.2)
Chloride: 97 mmol/L (ref 96–106)
Creatinine, Ser: 0.46 mg/dL — ABNORMAL LOW (ref 0.57–1.00)
GFR calc Af Amer: 128 mL/min/{1.73_m2} (ref >59)
GFR calc non Af Amer: 111 mL/min/{1.73_m2} (ref >59)
Globulin, Total: 2.5 g/dL (ref 1.5–4.5)
Glucose: 230 mg/dL — ABNORMAL HIGH (ref 65–99)
Potassium: 4.1 mmol/L (ref 3.5–5.2)
Sodium: 137 mmol/L (ref 134–144)
Total Protein: 6.9 g/dL (ref 6.0–8.5)

## 2017-11-02 MED ORDER — NITROGLYCERIN 0.4 MG SL SUBL: 0.4000 mg | SUBLINGUAL_TABLET | SUBLINGUAL | 3 refills | Status: DC | PRN

## 2017-11-02 NOTE — Progress Notes (Signed)
CARDIOLOGY OFFICE NOTE  Date:  11/02/2017    [name] Date of Birth: July 23, 1960 Medical Record #[id number]  PCP:  Charlott Rakes, MD  Cardiologist:  Meda Coffee  Chief Complaint  Patient presents with  . Chest Pain    Pre cath visit - seen for Dr. Meda Coffee    History of Present Illness: Sarah Charles is a 57 y.o. female who presents today for a pre cath visit. Seen for Dr. Meda Coffee.   She has a history of significant fordepression, type 2 diabetes,dyslipidemia, hypertension, obesity, ongoing tobacco abuse, and GERD.   She presented over the weekend to the ER with chest pressure associated with significant diaphoresis as well as some nausea and shortness of breath. Her FH is quite + for CAD with father dying at age 39 with MI.    She ruled out for ACS with negative troponin and EKG.  She was seen by cardiology who recommended a coronary CTA which showed moderate CAD with stenosis 50-59% in mid LAD.  FFR submitted and shows significant stenosis in the mLAD.   She is now referred for cardiac catheterization.   She comes in today. She is here with her daughter. Husband just had an MI and stents. They both smoke. She is quite emotional - admits she gets easily upset. No more chest pain reported. She does not have NTG on hand. Bp remains up but she is quite upset today. Has trouble affording her medicines.   Past Medical History:  Diagnosis Date  . Arthritis   . Coronary artery calcification seen on CT scan    a. 07/2017 noted on CT abd.  . Depression   . GERD (gastroesophageal reflux disease)   . Hyperlipidemia   . Hypertension   . Obesity   . Sleep apnea    a. Does not use CPAP "cause i dont think I need it anymore".  . Tobacco abuse   . Type II diabetes mellitus ([place])     Past Surgical History:  Procedure Laterality Date  . CHOLECYSTECTOMY    . INCISION AND DRAINAGE ABSCESS Left 12/22/2012   Procedure: INCISION AND DRAINAGE ABSCESS;  Surgeon: Jamesetta So, MD;   Location: [facility];  Service: General;  Laterality: Left;  . TONSILLECTOMY    . TUBAL LIGATION       Medications: Current Meds  Medication Sig  . amLODipine (NORVASC) 5 MG tablet Take 1 tablet (5 mg total) by mouth daily.  Marland Kitchen aspirin 81 MG EC tablet Take 1 tablet (81 mg total) by mouth daily.  Marland Kitchen atorvastatin (LIPITOR) 40 MG tablet Take 1 tablet (40 mg total) by mouth daily.  . Blood Glucose Monitoring Suppl (ACCU-CHEK AVIVA PLUS) w/Device KIT 1 each by Does not apply route 3 (three) times daily.  Marland Kitchen buPROPion (WELLBUTRIN SR) 150 MG 12 hr tablet Take 1 tablet (150 mg total) by mouth 2 (two) times daily.  . cetirizine (ZYRTEC) 10 MG tablet TAKE 1 TABLET (10 MG TOTAL) BY MOUTH DAILY.  Marland Kitchen gabapentin (NEURONTIN) 300 MG capsule Take 1 capsule (300 mg total) by mouth 2 (two) times daily.  Marland Kitchen glucose blood (ACCU-CHEK AVIVA) test strip Use as instructed  . HYDROcodone-acetaminophen (NORCO/VICODIN) 5-325 MG tablet Take 1 tablet by mouth every 6 (six) hours as needed for moderate pain (arms and legs).  . Insulin Glargine (LANTUS SOLOSTAR) 100 UNIT/ML Solostar Pen Inject 35 Units into the skin at bedtime.  . Insulin Pen Needle (PEN NEEDLES) 31G X 5 MM MISC 35 Units by  Does not apply route daily.  Elmore Guise Devices (ACCU-CHEK SOFTCLIX) lancets Use as instructed  . lisinopril (PRINIVIL,ZESTRIL) 20 MG tablet Take 1 tablet (20 mg total) by mouth daily.  . methocarbamol (ROBAXIN) 500 MG tablet Take 1 tablet (500 mg total) by mouth 3 (three) times daily.  . metoCLOPramide (REGLAN) 10 MG tablet TAKE 1 TABLET BY MOUTH EVERY 8 (EIGHT) HOURS AS NEEDED FOR NAUSEA OR VOMITING.  . metoprolol tartrate (LOPRESSOR) 25 MG tablet Take 1 tablet (25 mg total) by mouth 2 (two) times daily.  Marland Kitchen omega-3 acid ethyl esters (LOVAZA) 1 g capsule Take 1 capsule (1 g total) by mouth 2 (two) times daily.  Marland Kitchen omeprazole (PRILOSEC) 20 MG capsule Take 1 capsule (20 mg total) by mouth daily.  . sitaGLIPtin-metformin (JANUMET) 50-1000 MG tablet  Take 1 tablet by mouth 2 (two) times daily with a meal.  . traZODone (DESYREL) 50 MG tablet Take 1 tablet (50 mg total) by mouth at bedtime as needed for sleep.  . TRUEPLUS LANCETS 28G MISC Use as directed  . vitamin C (ASCORBIC ACID) 500 MG tablet Take 500 mg by mouth daily.     Allergies: Allergies  Allergen Reactions  . Cymbalta [Duloxetine Hcl] Nausea Only    Nausea, lack of therapeutic effect    Social History: The patient  reports that she has been smoking cigarettes.  She has a 41.00 pack-year smoking history. She has never used smokeless tobacco. She reports that she does not drink alcohol or use drugs.   Family History: The patient's family history includes CAD in her father; COPD in her mother; CVA in her mother; Cancer in her maternal grandfather and maternal uncle; Diabetes in her maternal grandmother and sister; Heart attack in her father and mother; Heart disease in her mother; Hypercholesterolemia in her father; Hypertension in her mother; Kidney Stones in her mother.   Review of Systems: Please see the history of present illness.   Otherwise, the review of systems is positive for none.   All other systems are reviewed and negative.   Physical Exam: VS:  BP (!) 150/90 (BP Location: Left Arm, Patient Position: Sitting, Cuff Size: Normal)   Pulse 70   Ht _0  (1.575 m)   Wt 179 lb 12.8 oz (81.6 kg)   SpO2 97% Comment: at rest  BMI 32.89 kg/m  .  BMI Body mass index is 32.89 kg/m.  Wt Readings from Last 3 Encounters:  11/02/17 179 lb 12.8 oz (81.6 kg)  10/30/17 178 lb 12.8 oz (81.1 kg)  08/15/17 170 lb (77.1 kg)    General: She looks much older than her stated age. She is very upset, crying but in no acute distress.  She is obese.  HEENT: Normal.  Neck: Supple, no JVD, carotid bruits, or masses noted.  Cardiac: Regular rate and rhythm. Heart tones are distant. No edema.  Respiratory:  Lungs are coarse.  GI: Soft and nontender.  MS: No deformity or atrophy.  Gait and ROM intact.  Skin: Warm and dry. Color is normal.  Neuro:  Strength and sensation are intact and no gross focal deficits noted.  Psych: Alert, appropriate and with normal affect.   LABORATORY DATA:  EKG:  EKG is not ordered today.  Lab Results  Component Value Date   WBC 7.0 10/29/2017   HGB 13.6 10/29/2017   HCT 41.2 10/29/2017   PLT 210 10/29/2017   GLUCOSE 345 (H) 10/29/2017   CHOL 222 (H) 05/30/2017   TRIG 518 (H)  05/30/2017   HDL 33 (L) 05/30/2017   LDLCALC Comment 05/30/2017   ALT 10 (L) 07/26/2017   AST 16 07/26/2017   NA 138 10/29/2017   K 4.1 10/29/2017   CL 101 10/29/2017   CREATININE 0.45 10/29/2017   BUN 13 10/29/2017   CO2 27 10/29/2017   TSH 0.965 10/29/2017   INR 0.94 10/29/2017   HGBA1C 8.6 (H) 10/29/2017   MICROALBUR 80 09/16/2016     BNP (last 3 results) No results for input(s): BNP in the last 8760 hours.  ProBNP (last 3 results) No results for input(s): PROBNP in the last 8760 hours.   Other Studies Reviewed Today:  Echo June 2019 Study Conclusions  - Left ventricle: The cavity size was normal. Systolic function was normal. The estimated ejection fraction was in the range of 55% to 60%.  Coronary CT IMPRESSION: 1. Coronary calcium score of 296. This was 64 percentile for age and sex matched control.  2. Normal coronary origin with right dominance.  3. Mild CAD in the proximal andmid RCA and proximal LAD. Moderate CAD with stenosis 50-69% in the mid LAD. Additional analysis with CT FFR will be submitted. Aggressive risk factors modification should be initiated.  FFR FINDINGS: FFRct analysis was performed on the original cardiac CT angiogram dataset. Diagrammatic representation of the FFRct analysis is provided in a separate PDF document in PACS. This dictation was created using the PDF document and an interactive 3D model of the results. 3D model is not available in the EMR/PACS. Normal FFR range is  >0.80.  1. Left Main:  No significant stenosis.  2. LAD: Proximal CT FFR: 0.95, mid: 0.78, distal: 0.68. 3. D1: CT FFR: 0.77. 4. LCX: Proximal CT FFR: 0.96, distal CT FFR: 0.76. 5. RCA: No significant stenosis.  IMPRESSION: 1. CT FFR showed significant stenosis in the mid LAD. Cardiac catheterization is recommended.   Electronically Signed   By: Ena Dawley   On: 10/31/2017 20:17   Assessment/Plan:  1. Chest pain - multiple CV risk factors and now with recent episode of chest pain with abnormal coronary CTA and elevated calcium score - cardiac catheterization has been recommended. The patient understands that risks include but are not limited to stroke (1 in 1000), death (1 in 49), kidney failure [usually temporary] (1 in 500), bleeding (1 in 200), allergic reaction [possibly serious] (1 in 200), and agrees to proceed. Arranging for Friday with Dr. [place]. Repeat lab today.   2. Tobacco abuse - total cessation imperative.   3. Depression - appears significant to me.   4. HLD - last lipids not at goal. Not fasting today - suspect uncontrolled DM also contributing.   5. HTN - has had Norvasc recently added. BP not at goal but she is quite upset today.   6. DM - uncontrolled per last A1C - I have asked her to see her PCP - she needs better control.   7. Obesity - will need addressing - not discussed today - she was pretty overwhelmed with our conversation regarding the need for cath.    Current medicines are reviewed with the patient today.  The patient does not have concerns regarding medicines other than what has been noted above.  The following changes have been made:  See above.  Labs/ tests ordered today include:    Orders Placed This Encounter  Procedures  . Comprehensive metabolic panel  . CBC with Differential/Platelet     Disposition:  Further disposition pending post cath findings.  Patient is agreeable to this plan and will call if any  problems develop in the interim.   SignedTruitt Merle, NP  11/02/2017 10:42 AM  [facility] [address] [place], [place]  [address] Phone: [phone] Fax: [phone]

## 2017-11-02 NOTE — Telephone Encounter (Signed)
Patient is coming in today.

## 2017-11-02 NOTE — Patient Instructions (Addendum)
We will be checking the following labs today - CMET and CBC   Medication Instructions:    Continue with your current medicines.  I have sent in a RX for NTG to your pharmacy - Use your NTG under your tongue for recurrent chest pain. May take one tablet every 5 minutes. If you are still having discomfort after 3 tablets in 15 minutes, call 911.     Testing/Procedures To Be Arranged:  Cardiac catheterization    Other Special Instructions:  Your provider has recommended a cardiac catherization  You are scheduled for a cardiac catheterization on Friday, June 14th at 10:30 AM with Dr. [place] or associate.  Please arrive at the [facility] (Main Entrance) at [facility] at [address] [place] -  2nd Floor Short Stay on Friday, June 14th at [facility].    Special note: Every effort is made to have your procedure done on time.   Please understand that emergencies sometimes delay a scheduled   procedure.  No solid feeds after midnight on Thursday. You may have clear liquids until 5 am on the day of your procedure.  On the morning of your procedure, take your baby aspirin.   You may take your morning medications with a sip of water on the day of your procedure.  Please take a baby aspirin (81 mg) on the morning of your procedure.   Medications to HOLD - NONE  Take 1/2 dose insulin the night prior on Thursday night  Plan for a one night stay -- bring personal belongings.  Bring a current list of your medications and current insurance cards.  You MUST have a responsible person to drive you home. Someone MUST be with you the first 24 hours after you arrive home or your discharge will be delayed. Wear clothes that are easy to get on and off and wear slip on shoes.    Coronary Angiogram A coronary angiogram, also called coronary angiography, is an X-ray procedure used to look at the arteries in the heart. In this procedure, a dye (contrast dye) is injected through a long,  hollow tube (catheter). The catheter is about the size of a piece of cooked spaghetti and is inserted through your groin, wrist, or arm. The dye is injected into each artery, and X-rays are then taken to show if there is a blockage in the arteries of your heart.  LET [facility] CARE PROVIDER KNOW ABOUT: Any allergies you have, including allergies to shellfish or contrast dye.   All medicines you are taking, including vitamins, herbs, eye drops, creams, and over-the-counter medicines.   Previous problems you or members of your family have had with the use of anesthetics.   Any blood disorders you have.   Previous surgeries you have had. History of kidney problems or failure.   Other medical conditions you have.  RISKS AND COMPLICATIONS  Generally, a coronary angiogram is a safe procedure. However, about 1 person out of 1000 can have problems that may include: Allergic reaction to the dye. Bleeding/bruising from the access site or other locations. Kidney injury, especially in people with impaired kidney function.  Stroke (rare). Heart attack (rare). Irregular rhythms (rare) Death (rare)  BEFORE THE PROCEDURE  Do not eat or drink anything after midnight the night before the procedure or as directed by your health care provider.   Ask your health care provider about changing or stopping your regular medicines. This is especially important if you are taking diabetes medicines or  blood thinners.  PROCEDURE You may be given a medicine to help you relax (sedative) before the procedure. This medicine is given through an intravenous (IV) access tube that is inserted into one of your veins.   The area where the catheter will be inserted will be washed and shaved. This is usually done in the groin but may be done in the fold of your arm (near your elbow) or in the wrist.    A medicine will be given to numb the area where the catheter will be inserted (local anesthetic).   The health care provider  will insert the catheter into an artery. The catheter will be guided by using a special type of X-ray (fluoroscopy) of the blood vessel being examined.   A special dye will then be injected into the catheter, and X-rays will be taken. The dye will help to show where any narrowing or blockages are located in the heart arteries.     AFTER THE PROCEDURE  If the procedure is done through the leg, you will be kept in bed lying flat for several hours. You will be instructed to not bend or cross your legs. The insertion site will be checked frequently.   The pulse in your feet or wrist will be checked frequently.   Additional blood tests, X-rays, and an electrocardiogram may be done.       If you need a refill on your cardiac medications before your next appointment, please call your pharmacy.   Call the [facility] office at [phone] if you have any questions, problems or concerns.

## 2017-11-02 NOTE — H&P (View-Only) (Signed)
CARDIOLOGY OFFICE NOTE  Date:  11/02/2017    [name] Date of Birth: July 23, 1960 Medical Record #[id number]  PCP:  Charlott Rakes, MD  Cardiologist:  Meda Coffee  Chief Complaint  Patient presents with  . Chest Pain    Pre cath visit - seen for Dr. Meda Coffee    History of Present Illness: Sarah Charles is a 57 y.o. female who presents today for a pre cath visit. Seen for Dr. Meda Coffee.   She has a history of significant fordepression, type 2 diabetes,dyslipidemia, hypertension, obesity, ongoing tobacco abuse, and GERD.   She presented over the weekend to the ER with chest pressure associated with significant diaphoresis as well as some nausea and shortness of breath. Her FH is quite + for CAD with father dying at age 39 with MI.    She ruled out for ACS with negative troponin and EKG.  She was seen by cardiology who recommended a coronary CTA which showed moderate CAD with stenosis 50-59% in mid LAD.  FFR submitted and shows significant stenosis in the mLAD.   She is now referred for cardiac catheterization.   She comes in today. She is here with her daughter. Husband just had an MI and stents. They both smoke. She is quite emotional - admits she gets easily upset. No more chest pain reported. She does not have NTG on hand. Bp remains up but she is quite upset today. Has trouble affording her medicines.   Past Medical History:  Diagnosis Date  . Arthritis   . Coronary artery calcification seen on CT scan    a. 07/2017 noted on CT abd.  . Depression   . GERD (gastroesophageal reflux disease)   . Hyperlipidemia   . Hypertension   . Obesity   . Sleep apnea    a. Does not use CPAP "cause i dont think I need it anymore".  . Tobacco abuse   . Type II diabetes mellitus ([place])     Past Surgical History:  Procedure Laterality Date  . CHOLECYSTECTOMY    . INCISION AND DRAINAGE ABSCESS Left 12/22/2012   Procedure: INCISION AND DRAINAGE ABSCESS;  Surgeon: Jamesetta So, MD;   Location: [facility];  Service: General;  Laterality: Left;  . TONSILLECTOMY    . TUBAL LIGATION       Medications: Current Meds  Medication Sig  . amLODipine (NORVASC) 5 MG tablet Take 1 tablet (5 mg total) by mouth daily.  Marland Kitchen aspirin 81 MG EC tablet Take 1 tablet (81 mg total) by mouth daily.  Marland Kitchen atorvastatin (LIPITOR) 40 MG tablet Take 1 tablet (40 mg total) by mouth daily.  . Blood Glucose Monitoring Suppl (ACCU-CHEK AVIVA PLUS) w/Device KIT 1 each by Does not apply route 3 (three) times daily.  Marland Kitchen buPROPion (WELLBUTRIN SR) 150 MG 12 hr tablet Take 1 tablet (150 mg total) by mouth 2 (two) times daily.  . cetirizine (ZYRTEC) 10 MG tablet TAKE 1 TABLET (10 MG TOTAL) BY MOUTH DAILY.  Marland Kitchen gabapentin (NEURONTIN) 300 MG capsule Take 1 capsule (300 mg total) by mouth 2 (two) times daily.  Marland Kitchen glucose blood (ACCU-CHEK AVIVA) test strip Use as instructed  . HYDROcodone-acetaminophen (NORCO/VICODIN) 5-325 MG tablet Take 1 tablet by mouth every 6 (six) hours as needed for moderate pain (arms and legs).  . Insulin Glargine (LANTUS SOLOSTAR) 100 UNIT/ML Solostar Pen Inject 35 Units into the skin at bedtime.  . Insulin Pen Needle (PEN NEEDLES) 31G X 5 MM MISC 35 Units by  Does not apply route daily.  Elmore Guise Devices (ACCU-CHEK SOFTCLIX) lancets Use as instructed  . lisinopril (PRINIVIL,ZESTRIL) 20 MG tablet Take 1 tablet (20 mg total) by mouth daily.  . methocarbamol (ROBAXIN) 500 MG tablet Take 1 tablet (500 mg total) by mouth 3 (three) times daily.  . metoCLOPramide (REGLAN) 10 MG tablet TAKE 1 TABLET BY MOUTH EVERY 8 (EIGHT) HOURS AS NEEDED FOR NAUSEA OR VOMITING.  . metoprolol tartrate (LOPRESSOR) 25 MG tablet Take 1 tablet (25 mg total) by mouth 2 (two) times daily.  Marland Kitchen omega-3 acid ethyl esters (LOVAZA) 1 g capsule Take 1 capsule (1 g total) by mouth 2 (two) times daily.  Marland Kitchen omeprazole (PRILOSEC) 20 MG capsule Take 1 capsule (20 mg total) by mouth daily.  . sitaGLIPtin-metformin (JANUMET) 50-1000 MG tablet  Take 1 tablet by mouth 2 (two) times daily with a meal.  . traZODone (DESYREL) 50 MG tablet Take 1 tablet (50 mg total) by mouth at bedtime as needed for sleep.  . TRUEPLUS LANCETS 28G MISC Use as directed  . vitamin C (ASCORBIC ACID) 500 MG tablet Take 500 mg by mouth daily.     Allergies: Allergies  Allergen Reactions  . Cymbalta [Duloxetine Hcl] Nausea Only    Nausea, lack of therapeutic effect    Social History: The patient  reports that she has been smoking cigarettes.  She has a 41.00 pack-year smoking history. She has never used smokeless tobacco. She reports that she does not drink alcohol or use drugs.   Family History: The patient's family history includes CAD in her father; COPD in her mother; CVA in her mother; Cancer in her maternal grandfather and maternal uncle; Diabetes in her maternal grandmother and sister; Heart attack in her father and mother; Heart disease in her mother; Hypercholesterolemia in her father; Hypertension in her mother; Kidney Stones in her mother.   Review of Systems: Please see the history of present illness.   Otherwise, the review of systems is positive for none.   All other systems are reviewed and negative.   Physical Exam: VS:  BP (!) 150/90 (BP Location: Left Arm, Patient Position: Sitting, Cuff Size: Normal)   Pulse 70   Ht _0  (1.575 m)   Wt 179 lb 12.8 oz (81.6 kg)   SpO2 97% Comment: at rest  BMI 32.89 kg/m  .  BMI Body mass index is 32.89 kg/m.  Wt Readings from Last 3 Encounters:  11/02/17 179 lb 12.8 oz (81.6 kg)  10/30/17 178 lb 12.8 oz (81.1 kg)  08/15/17 170 lb (77.1 kg)    General: She looks much older than her stated age. She is very upset, crying but in no acute distress.  She is obese.  HEENT: Normal.  Neck: Supple, no JVD, carotid bruits, or masses noted.  Cardiac: Regular rate and rhythm. Heart tones are distant. No edema.  Respiratory:  Lungs are coarse.  GI: Soft and nontender.  MS: No deformity or atrophy.  Gait and ROM intact.  Skin: Warm and dry. Color is normal.  Neuro:  Strength and sensation are intact and no gross focal deficits noted.  Psych: Alert, appropriate and with normal affect.   LABORATORY DATA:  EKG:  EKG is not ordered today.  Lab Results  Component Value Date   WBC 7.0 10/29/2017   HGB 13.6 10/29/2017   HCT 41.2 10/29/2017   PLT 210 10/29/2017   GLUCOSE 345 (H) 10/29/2017   CHOL 222 (H) 05/30/2017   TRIG 518 (H)  05/30/2017   HDL 33 (L) 05/30/2017   LDLCALC Comment 05/30/2017   ALT 10 (L) 07/26/2017   AST 16 07/26/2017   NA 138 10/29/2017   K 4.1 10/29/2017   CL 101 10/29/2017   CREATININE 0.45 10/29/2017   BUN 13 10/29/2017   CO2 27 10/29/2017   TSH 0.965 10/29/2017   INR 0.94 10/29/2017   HGBA1C 8.6 (H) 10/29/2017   MICROALBUR 80 09/16/2016     BNP (last 3 results) No results for input(s): BNP in the last 8760 hours.  ProBNP (last 3 results) No results for input(s): PROBNP in the last 8760 hours.   Other Studies Reviewed Today:  Echo June 2019 Study Conclusions  - Left ventricle: The cavity size was normal. Systolic function was normal. The estimated ejection fraction was in the range of 55% to 60%.  Coronary CT IMPRESSION: 1. Coronary calcium score of 296. This was 64 percentile for age and sex matched control.  2. Normal coronary origin with right dominance.  3. Mild CAD in the proximal andmid RCA and proximal LAD. Moderate CAD with stenosis 50-69% in the mid LAD. Additional analysis with CT FFR will be submitted. Aggressive risk factors modification should be initiated.  FFR FINDINGS: FFRct analysis was performed on the original cardiac CT angiogram dataset. Diagrammatic representation of the FFRct analysis is provided in a separate PDF document in PACS. This dictation was created using the PDF document and an interactive 3D model of the results. 3D model is not available in the EMR/PACS. Normal FFR range is  >0.80.  1. Left Main:  No significant stenosis.  2. LAD: Proximal CT FFR: 0.95, mid: 0.78, distal: 0.68. 3. D1: CT FFR: 0.77. 4. LCX: Proximal CT FFR: 0.96, distal CT FFR: 0.76. 5. RCA: No significant stenosis.  IMPRESSION: 1. CT FFR showed significant stenosis in the mid LAD. Cardiac catheterization is recommended.   Electronically Signed   By: Ena Dawley   On: 10/31/2017 20:17   Assessment/Plan:  1. Chest pain - multiple CV risk factors and now with recent episode of chest pain with abnormal coronary CTA and elevated calcium score - cardiac catheterization has been recommended. The patient understands that risks include but are not limited to stroke (1 in 1000), death (1 in 49), kidney failure [usually temporary] (1 in 500), bleeding (1 in 200), allergic reaction [possibly serious] (1 in 200), and agrees to proceed. Arranging for Friday with Dr. [place]. Repeat lab today.   2. Tobacco abuse - total cessation imperative.   3. Depression - appears significant to me.   4. HLD - last lipids not at goal. Not fasting today - suspect uncontrolled DM also contributing.   5. HTN - has had Norvasc recently added. BP not at goal but she is quite upset today.   6. DM - uncontrolled per last A1C - I have asked her to see her PCP - she needs better control.   7. Obesity - will need addressing - not discussed today - she was pretty overwhelmed with our conversation regarding the need for cath.    Current medicines are reviewed with the patient today.  The patient does not have concerns regarding medicines other than what has been noted above.  The following changes have been made:  See above.  Labs/ tests ordered today include:    Orders Placed This Encounter  Procedures  . Comprehensive metabolic panel  . CBC with Differential/Platelet     Disposition:  Further disposition pending post cath findings.  Patient is agreeable to this plan and will call if any  problems develop in the interim.   SignedTruitt Merle, NP  11/02/2017 10:42 AM  [facility] [address] [place], [place]  [address] Phone: [phone] Fax: [phone]

## 2017-11-02 NOTE — Telephone Encounter (Signed)
Hi!  This patient was in the hospital this past weekend for chest pain, I read the CT as moderate CAD, however CT FFR showed significant stenosis in the mid LAD, she was discharged before we got the results.  Could you bring her to the clinic to schedule a cath or just schedule a cath if she agrees to it?  Thank you,  Aris LotKatarina   Pt has an appt with Norma FredricksonLori Gerhardt NP for today, to discuss scheduling of cath for noted abnormal coronary ct.

## 2017-11-03 ENCOUNTER — Telehealth: Payer: Self-pay | Admitting: *Deleted

## 2017-11-03 IMAGING — DX DG ABDOMEN ACUTE W/ 1V CHEST
4 series · 4 of 4 positions shown · non-contrast
Comparison: Chest x-ray 11/02/2008 and lumbar spine 08/05/2011

CLINICAL DATA: Sporadic episodes of diarrhea beginning 6 weeks ago.
Diffuse abdominal pain as well vomiting and difficulty controlling
urination. Cough and congestion 3 weeks.

EXAM:
DG ABDOMEN ACUTE W/ 1V CHEST

[chest pa]
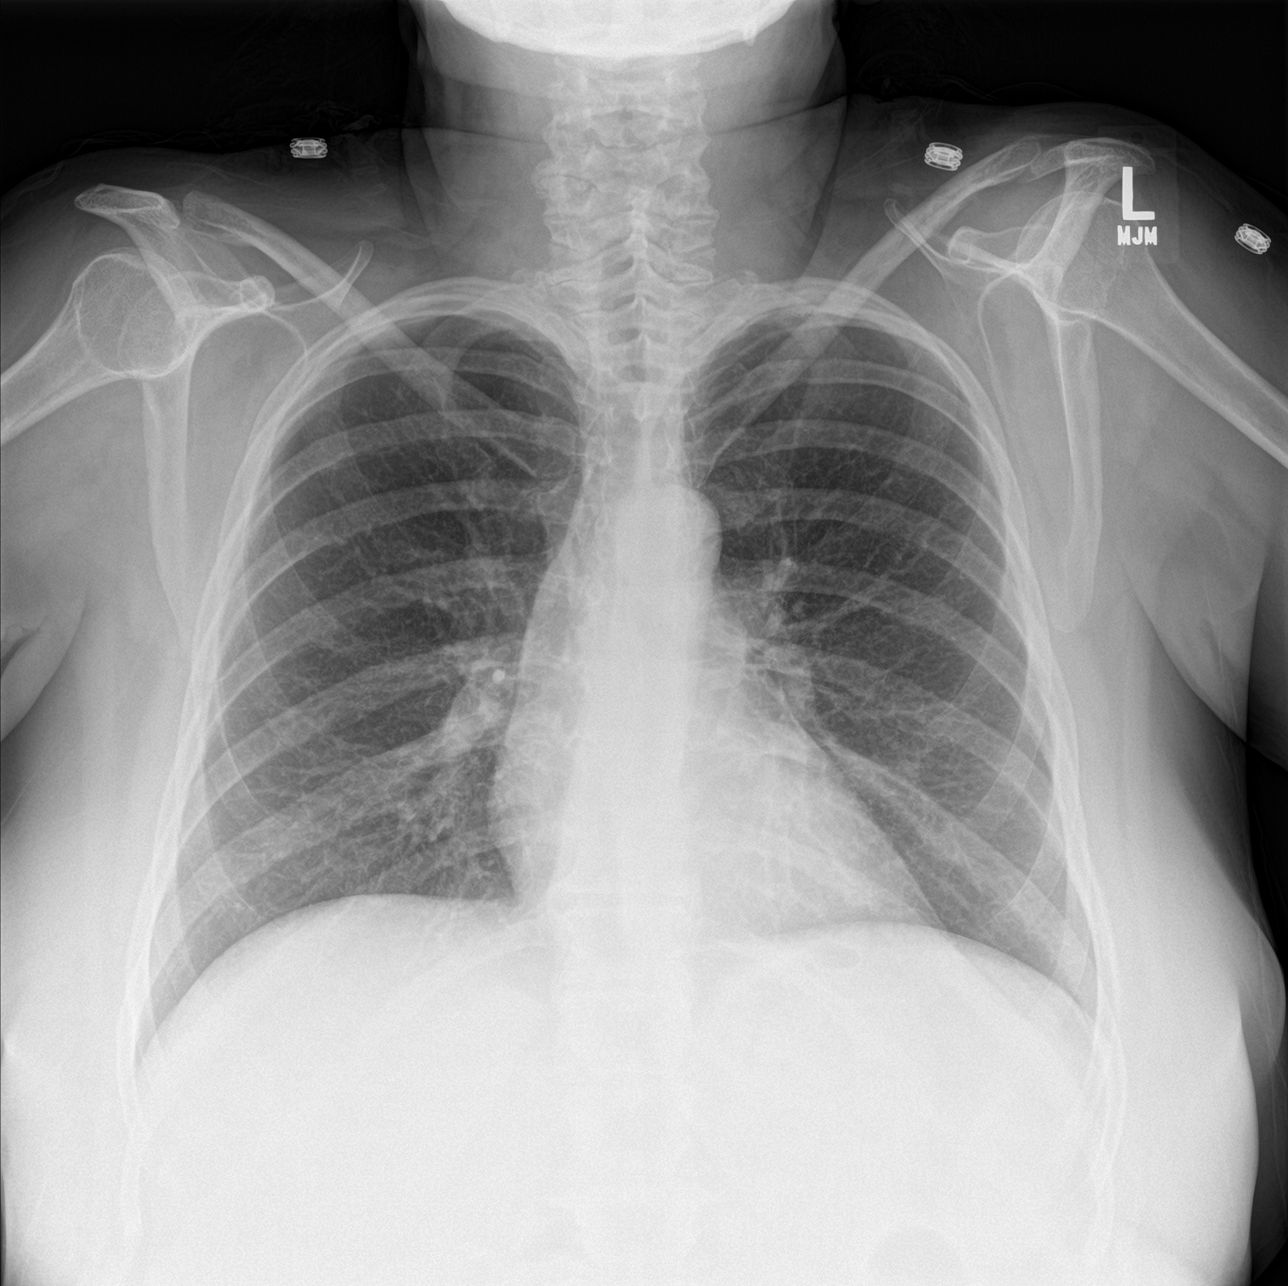

[abdomen erect]
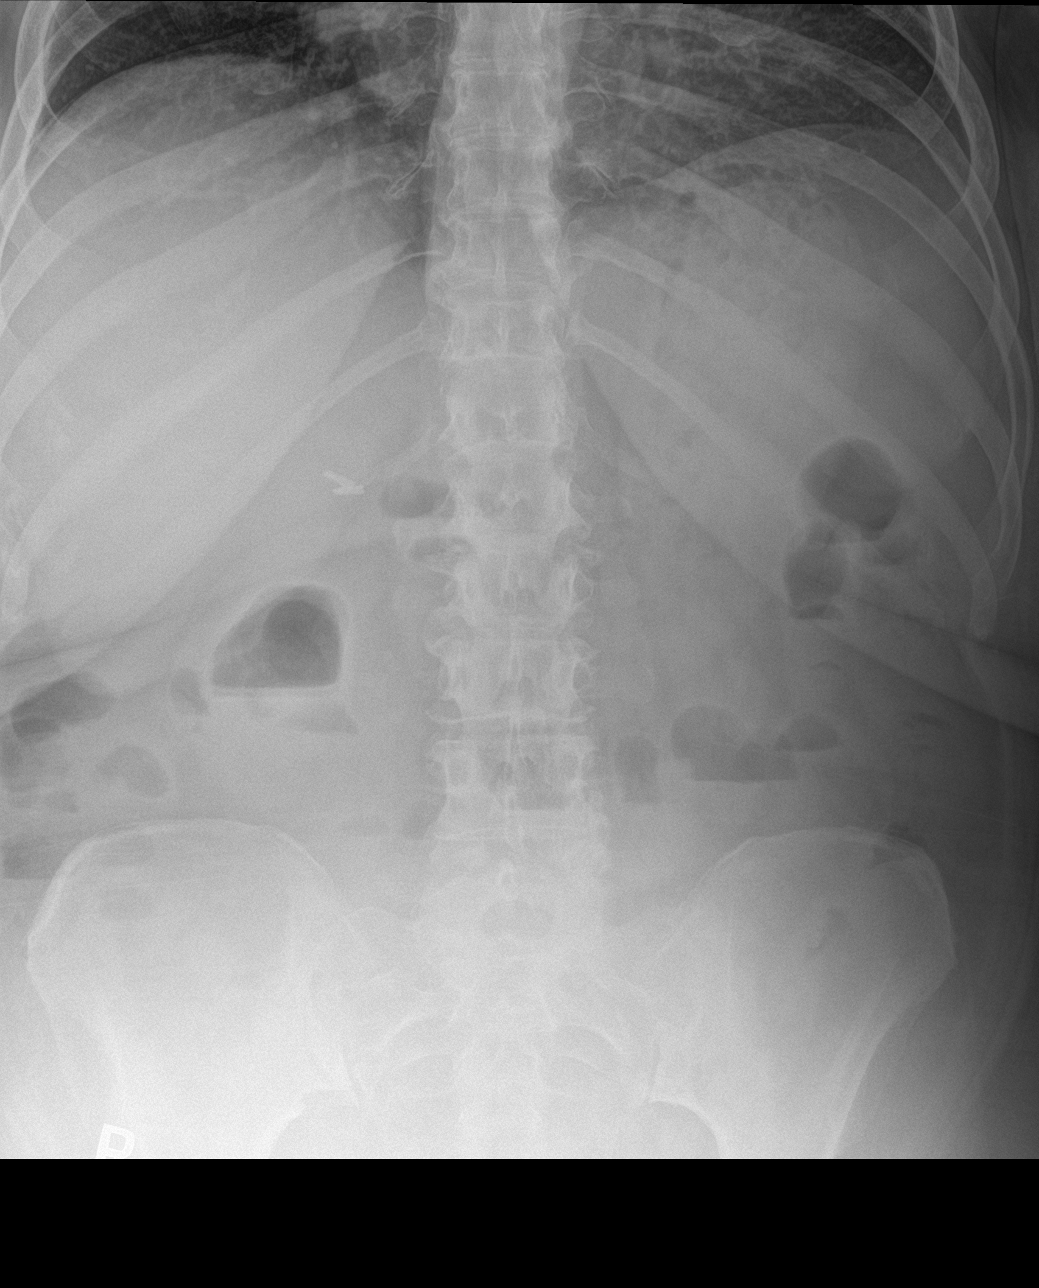

[abdomen supine (1 of 2)]
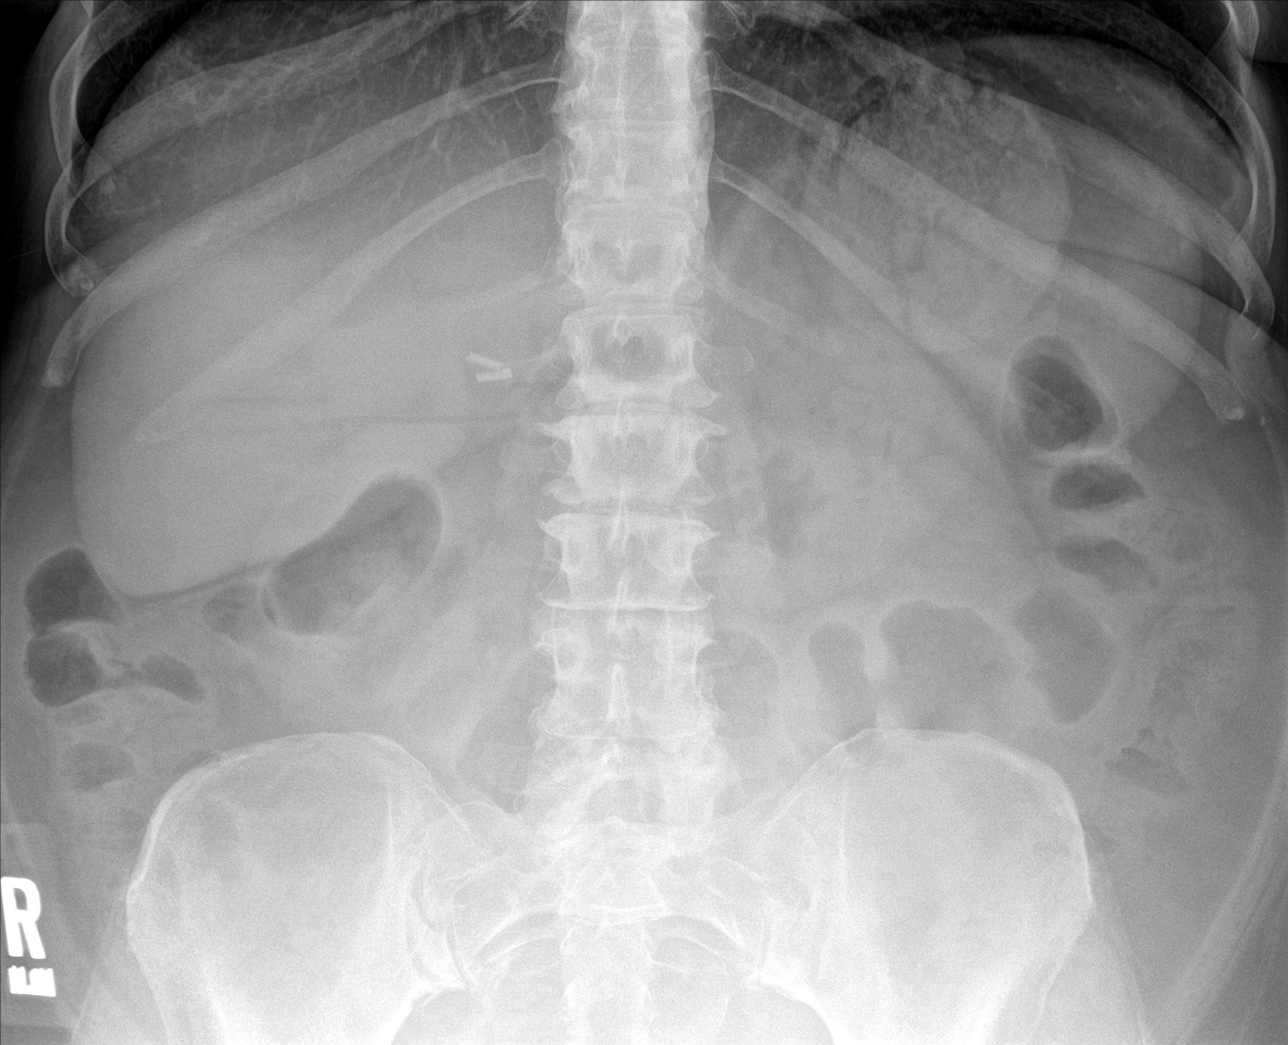

[abdomen supine (2 of 2)]
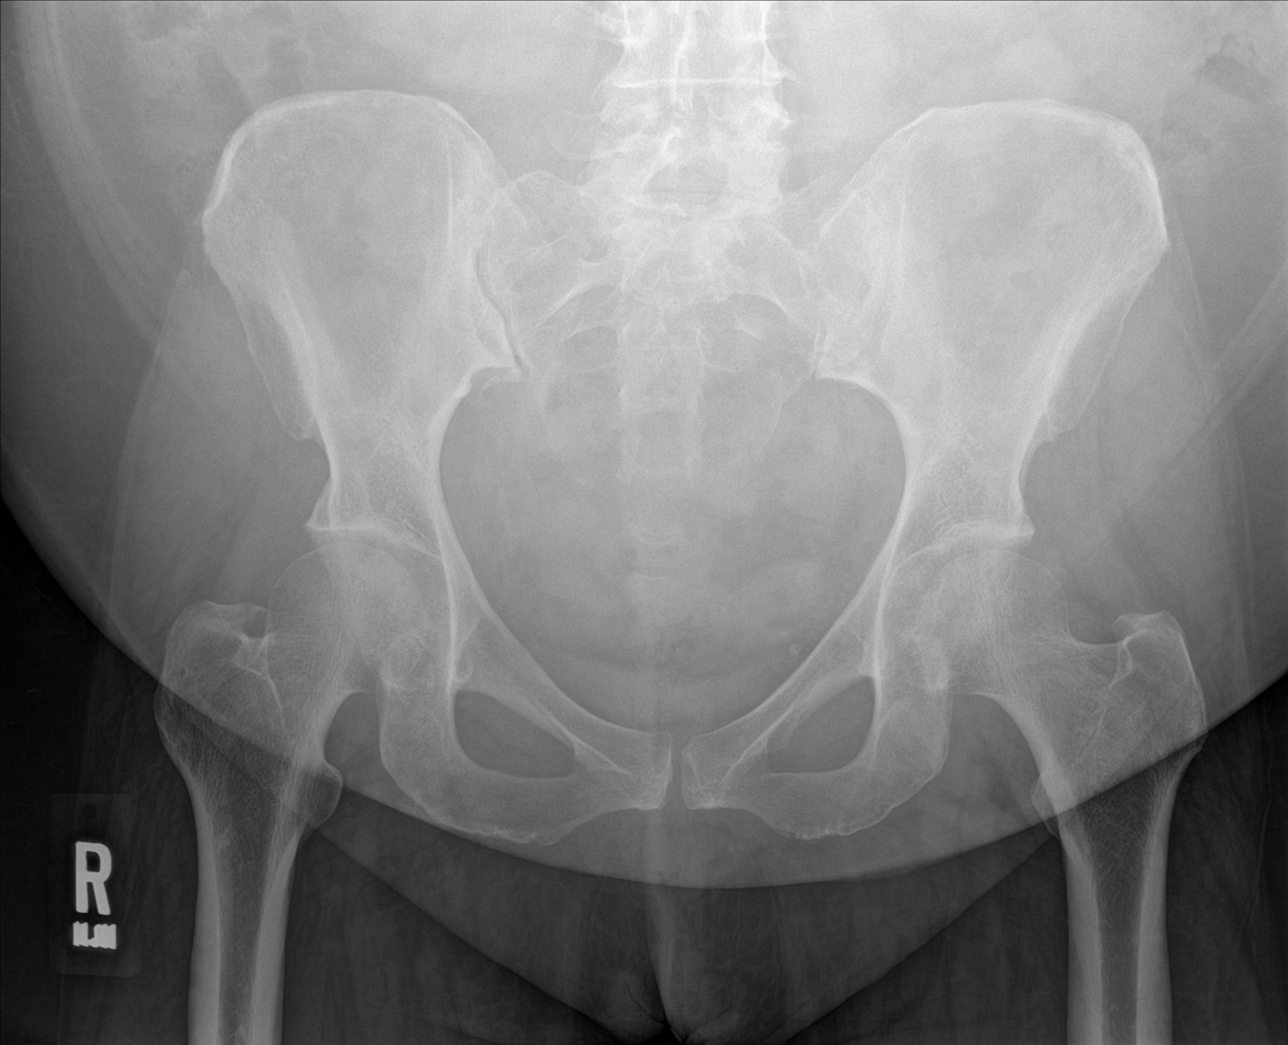

[4 of 4 positions shown; findings below may reference images not displayed]

FINDINGS: Lungs are adequately inflated without focal consolidation or
effusion. Cardiomediastinal silhouette and remainder of the chest is
unchanged.

Abdominal pelvic images demonstrate no evidence of free peritoneal
air. Air is present throughout the colon. There are a few air-fluid
levels over the colon. Surgical clips over the right upper quadrant.
Degenerative change of the spine and hips.
IMPRESSION: Nonspecific scattered air-fluid levels over the colon. No evidence
of obstruction.

No acute cardiopulmonary disease.

## 2017-11-03 NOTE — Telephone Encounter (Addendum)
[facility]: Friday November 04, 2017 10:30 AM Romana JuniperVeriy [facility] at: 8 AM  No solid food after midnight prior to cath, clear liquids until 5 AM day of procedure. Verify allergies in Epic  Hold: Insulin AM of procedure. 1/2 long acting Insulin PM prior to procedure. Janumet day of procedure and 48 hours post procedure.  Except hold medications AM meds can be  taken pre-cath with sip of water including: ASA 81 mg  Confirm patient has responsible person to drive home post procedure and observe patient for 24 hours-yes  Discussed instructions, patient verbalized understanding, thanked me for call.

## 2017-11-04 ENCOUNTER — Encounter (HOSPITAL_COMMUNITY)
Admission: RE | Disposition: A | Discharge status: Home / Self Care | Payer: Self-pay | Source: Ambulatory Visit | Attending: Cardiology

## 2017-11-04 ENCOUNTER — Ambulatory Visit (HOSPITAL_COMMUNITY)
Admission: RE | Admit: 2017-11-04 | Discharge: 2017-11-04 | Disposition: A | Discharge status: Home / Self Care | Payer: Medicaid Other | Source: Ambulatory Visit | Attending: Cardiology | Admitting: Cardiology

## 2017-11-04 DIAGNOSIS — F17200 Nicotine dependence, unspecified, uncomplicated: Secondary | ICD-10-CM | POA: Diagnosis present

## 2017-11-04 DIAGNOSIS — E1165 Type 2 diabetes mellitus with hyperglycemia: Secondary | ICD-10-CM | POA: Diagnosis not present

## 2017-11-04 DIAGNOSIS — E118 Type 2 diabetes mellitus with unspecified complications: Secondary | ICD-10-CM

## 2017-11-04 DIAGNOSIS — Z823 Family history of stroke: Secondary | ICD-10-CM | POA: Insufficient documentation

## 2017-11-04 DIAGNOSIS — Z888 Allergy status to other drugs, medicaments and biological substances status: Secondary | ICD-10-CM | POA: Insufficient documentation

## 2017-11-04 DIAGNOSIS — I2510 Atherosclerotic heart disease of native coronary artery without angina pectoris: Secondary | ICD-10-CM | POA: Diagnosis not present

## 2017-11-04 DIAGNOSIS — Z9851 Tubal ligation status: Secondary | ICD-10-CM | POA: Insufficient documentation

## 2017-11-04 DIAGNOSIS — Z9049 Acquired absence of other specified parts of digestive tract: Secondary | ICD-10-CM | POA: Insufficient documentation

## 2017-11-04 DIAGNOSIS — Z9889 Other specified postprocedural states: Secondary | ICD-10-CM | POA: Insufficient documentation

## 2017-11-04 DIAGNOSIS — I25119 Atherosclerotic heart disease of native coronary artery with unspecified angina pectoris: Secondary | ICD-10-CM | POA: Diagnosis not present

## 2017-11-04 DIAGNOSIS — F329 Major depressive disorder, single episode, unspecified: Secondary | ICD-10-CM | POA: Diagnosis not present

## 2017-11-04 DIAGNOSIS — G4730 Sleep apnea, unspecified: Secondary | ICD-10-CM | POA: Diagnosis not present

## 2017-11-04 DIAGNOSIS — E669 Obesity, unspecified: Secondary | ICD-10-CM | POA: Diagnosis present

## 2017-11-04 DIAGNOSIS — IMO0002 Reserved for concepts with insufficient information to code with codable children: Secondary | ICD-10-CM

## 2017-11-04 DIAGNOSIS — Z7982 Long term (current) use of aspirin: Secondary | ICD-10-CM | POA: Insufficient documentation

## 2017-11-04 DIAGNOSIS — R072 Precordial pain: Secondary | ICD-10-CM | POA: Diagnosis present

## 2017-11-04 DIAGNOSIS — Z6832 Body mass index (BMI) 32.0-32.9, adult: Secondary | ICD-10-CM | POA: Diagnosis not present

## 2017-11-04 DIAGNOSIS — Z833 Family history of diabetes mellitus: Secondary | ICD-10-CM | POA: Diagnosis not present

## 2017-11-04 DIAGNOSIS — Z79899 Other long term (current) drug therapy: Secondary | ICD-10-CM | POA: Insufficient documentation

## 2017-11-04 DIAGNOSIS — E782 Mixed hyperlipidemia: Secondary | ICD-10-CM | POA: Diagnosis not present

## 2017-11-04 DIAGNOSIS — I10 Essential (primary) hypertension: Secondary | ICD-10-CM | POA: Diagnosis present

## 2017-11-04 DIAGNOSIS — K219 Gastro-esophageal reflux disease without esophagitis: Secondary | ICD-10-CM | POA: Insufficient documentation

## 2017-11-04 DIAGNOSIS — R079 Chest pain, unspecified: Secondary | ICD-10-CM

## 2017-11-04 DIAGNOSIS — R0789 Other chest pain: Secondary | ICD-10-CM | POA: Diagnosis present

## 2017-11-04 DIAGNOSIS — R9389 Abnormal findings on diagnostic imaging of other specified body structures: Secondary | ICD-10-CM

## 2017-11-04 DIAGNOSIS — Z794 Long term (current) use of insulin: Secondary | ICD-10-CM | POA: Diagnosis not present

## 2017-11-04 DIAGNOSIS — M1990 Unspecified osteoarthritis, unspecified site: Secondary | ICD-10-CM | POA: Insufficient documentation

## 2017-11-04 DIAGNOSIS — E119 Type 2 diabetes mellitus without complications: Secondary | ICD-10-CM

## 2017-11-04 DIAGNOSIS — Z8249 Family history of ischemic heart disease and other diseases of the circulatory system: Secondary | ICD-10-CM | POA: Diagnosis not present

## 2017-11-04 DIAGNOSIS — Z955 Presence of coronary angioplasty implant and graft: Secondary | ICD-10-CM

## 2017-11-04 HISTORY — PX: INTRAVASCULAR PRESSURE WIRE/FFR STUDY: CATH118243

## 2017-11-04 HISTORY — PX: LEFT HEART CATH AND CORONARY ANGIOGRAPHY: CATH118249

## 2017-11-04 HISTORY — PX: CORONARY STENT INTERVENTION: CATH118234

## 2017-11-04 HISTORY — PX: CORONARY PRESSURE/FFR STUDY: CATH118243

## 2017-11-04 LAB — GLUCOSE, CAPILLARY
Glucose-Capillary: 240 mg/dL — ABNORMAL HIGH (ref 65–99)
Glucose-Capillary: 170 mg/dL — ABNORMAL HIGH (ref 65–99)

## 2017-11-04 LAB — POCT ACTIVATED CLOTTING TIME
Activated Clotting Time: 213 seconds
Activated Clotting Time: 235 seconds

## 2017-11-04 SURGERY — LEFT HEART CATH AND CORONARY ANGIOGRAPHY
Anesthesia: LOCAL

## 2017-11-04 MED ORDER — LISINOPRIL 20 MG PO TABS
20.0000 mg | ORAL_TABLET | Freq: Every day | ORAL | Status: DC
  Filled 2017-11-04: qty 1

## 2017-11-04 MED ORDER — HEPARIN SODIUM (PORCINE) 1000 UNIT/ML IJ SOLN
INTRAMUSCULAR | Status: AC
  Filled 2017-11-04: qty 1

## 2017-11-04 MED ORDER — LIDOCAINE HCL (PF) 1 % IJ SOLN
INTRAMUSCULAR | Status: DC | PRN
  Administered 2017-11-04: 2 mL

## 2017-11-04 MED ORDER — TRAZODONE HCL 50 MG PO TABS: 50.0000 mg | ORAL_TABLET | Freq: Every evening | ORAL | Status: DC | PRN

## 2017-11-04 MED ORDER — ATORVASTATIN CALCIUM 40 MG PO TABS: 40.0000 mg | ORAL_TABLET | Freq: Every evening | ORAL | Status: DC

## 2017-11-04 MED ORDER — ASPIRIN 81 MG PO CHEW: 81.0000 mg | CHEWABLE_TABLET | ORAL | Status: DC

## 2017-11-04 MED ORDER — IOHEXOL 350 MG/ML SOLN
INTRAVENOUS | Status: DC | PRN
  Administered 2017-11-04: 95 mL via INTRA_ARTERIAL

## 2017-11-04 MED ORDER — MIDAZOLAM HCL 2 MG/2ML IJ SOLN
INTRAMUSCULAR | Status: AC
  Filled 2017-11-04: qty 2

## 2017-11-04 MED ORDER — CLOPIDOGREL BISULFATE 300 MG PO TABS
ORAL_TABLET | ORAL | Status: DC | PRN
  Administered 2017-11-04: 600 mg via ORAL

## 2017-11-04 MED ORDER — FENTANYL CITRATE (PF) 100 MCG/2ML IJ SOLN
INTRAMUSCULAR | Status: AC
  Filled 2017-11-04: qty 2

## 2017-11-04 MED ORDER — ONDANSETRON HCL 4 MG/2ML IJ SOLN: 4.0000 mg | Freq: Four times a day (QID) | INTRAMUSCULAR | Status: DC | PRN

## 2017-11-04 MED ORDER — LABETALOL HCL 5 MG/ML IV SOLN
10.0000 mg | INTRAVENOUS | Status: DC | PRN
  Administered 2017-11-04: 10 mg via INTRAVENOUS

## 2017-11-04 MED ORDER — AMLODIPINE BESYLATE 5 MG PO TABS
5.0000 mg | ORAL_TABLET | Freq: Every day | ORAL | Status: DC
  Filled 2017-11-04: qty 1

## 2017-11-04 MED ORDER — DIAZEPAM 5 MG PO TABS
10.0000 mg | ORAL_TABLET | ORAL | Status: AC
  Administered 2017-11-04: 10 mg via ORAL

## 2017-11-04 MED ORDER — CLOPIDOGREL BISULFATE 75 MG PO TABS: 75.0000 mg | ORAL_TABLET | Freq: Every day | ORAL | 0 refills | Status: DC

## 2017-11-04 MED ORDER — METOCLOPRAMIDE HCL 10 MG PO TABS: 10.0000 mg | ORAL_TABLET | Freq: Three times a day (TID) | ORAL | Status: DC | PRN

## 2017-11-04 MED ORDER — CLOPIDOGREL BISULFATE 75 MG PO TABS: 75.0000 mg | ORAL_TABLET | Freq: Every day | ORAL | 2 refills | Status: DC

## 2017-11-04 MED ORDER — NITROGLYCERIN 0.4 MG SL SUBL: 0.4000 mg | SUBLINGUAL_TABLET | SUBLINGUAL | Status: DC | PRN

## 2017-11-04 MED ORDER — DIAZEPAM 5 MG PO TABS
ORAL_TABLET | ORAL | Status: AC
  Filled 2017-11-04: qty 2

## 2017-11-04 MED ORDER — AMLODIPINE BESYLATE 5 MG PO TABS
5.0000 mg | ORAL_TABLET | Freq: Every day | ORAL | Status: DC
  Administered 2017-11-04: 5 mg via ORAL

## 2017-11-04 MED ORDER — METOPROLOL TARTRATE 25 MG PO TABS
25.0000 mg | ORAL_TABLET | Freq: Two times a day (BID) | ORAL | Status: DC
  Administered 2017-11-04: 25 mg via ORAL
  Filled 2017-11-04: qty 1

## 2017-11-04 MED ORDER — SITAGLIPTIN PHOS-METFORMIN HCL 50-1000 MG PO TABS: 1.0000 | ORAL_TABLET | Freq: Two times a day (BID) | ORAL | 0 refills | Status: DC

## 2017-11-04 MED ORDER — ADENOSINE 12 MG/4ML IV SOLN
INTRAVENOUS | Status: AC
  Filled 2017-11-04: qty 16

## 2017-11-04 MED ORDER — HYDRALAZINE HCL 20 MG/ML IJ SOLN: 5.0000 mg | INTRAMUSCULAR | Status: DC | PRN

## 2017-11-04 MED ORDER — HEPARIN (PORCINE) IN NACL 1000-0.9 UT/500ML-% IV SOLN
INTRAVENOUS | Status: AC
  Filled 2017-11-04: qty 1000

## 2017-11-04 MED ORDER — NITROGLYCERIN 1 MG/10 ML FOR IR/CATH LAB
INTRA_ARTERIAL | Status: DC | PRN
  Administered 2017-11-04 (×2): 200 ug via INTRACORONARY

## 2017-11-04 MED ORDER — ADENOSINE (DIAGNOSTIC) 140MCG/KG/MIN
INTRAVENOUS | Status: DC | PRN
  Administered 2017-11-04: 140 ug/kg/min via INTRAVENOUS

## 2017-11-04 MED ORDER — FENTANYL CITRATE (PF) 100 MCG/2ML IJ SOLN
INTRAMUSCULAR | Status: DC | PRN
  Administered 2017-11-04: 25 ug via INTRAVENOUS

## 2017-11-04 MED ORDER — LIDOCAINE HCL (PF) 1 % IJ SOLN
INTRAMUSCULAR | Status: AC
  Filled 2017-11-04: qty 30

## 2017-11-04 MED ORDER — CLOPIDOGREL BISULFATE 300 MG PO TABS
ORAL_TABLET | ORAL | Status: AC
  Filled 2017-11-04: qty 2

## 2017-11-04 MED ORDER — VERAPAMIL HCL 2.5 MG/ML IV SOLN
INTRAVENOUS | Status: AC
  Filled 2017-11-04: qty 2

## 2017-11-04 MED ORDER — FAMOTIDINE IN NACL 20-0.9 MG/50ML-% IV SOLN
INTRAVENOUS | Status: AC
  Filled 2017-11-04: qty 50

## 2017-11-04 MED ORDER — PANTOPRAZOLE SODIUM 40 MG PO TBEC: 40.0000 mg | DELAYED_RELEASE_TABLET | Freq: Every day | ORAL | Status: DC

## 2017-11-04 MED ORDER — HEPARIN (PORCINE) IN NACL 2-0.9 UNITS/ML
INTRAMUSCULAR | Status: DC | PRN
  Administered 2017-11-04: 10 mL via INTRA_ARTERIAL

## 2017-11-04 MED ORDER — METHOCARBAMOL 500 MG PO TABS: 500.0000 mg | ORAL_TABLET | Freq: Three times a day (TID) | ORAL | Status: DC

## 2017-11-04 MED ORDER — CLOPIDOGREL BISULFATE 75 MG PO TABS: 75.0000 mg | ORAL_TABLET | Freq: Every day | ORAL | Status: DC

## 2017-11-04 MED ORDER — LABETALOL HCL 5 MG/ML IV SOLN
INTRAVENOUS | Status: AC
  Administered 2017-11-04: 10 mg via INTRAVENOUS
  Filled 2017-11-04: qty 4

## 2017-11-04 MED ORDER — SODIUM CHLORIDE 0.9 % WEIGHT BASED INFUSION: 1.0000 mL/kg/h | INTRAVENOUS | Status: DC

## 2017-11-04 MED ORDER — OMEGA-3-ACID ETHYL ESTERS 1 G PO CAPS: 1.0000 g | ORAL_CAPSULE | Freq: Two times a day (BID) | ORAL | Status: DC

## 2017-11-04 MED ORDER — ASPIRIN 81 MG PO TBEC: 81.0000 mg | DELAYED_RELEASE_TABLET | Freq: Every day | ORAL | Status: DC

## 2017-11-04 MED ORDER — SODIUM CHLORIDE 0.9% FLUSH: 3.0000 mL | INTRAVENOUS | Status: DC | PRN

## 2017-11-04 MED ORDER — GABAPENTIN 300 MG PO CAPS
300.0000 mg | ORAL_CAPSULE | Freq: Two times a day (BID) | ORAL | Status: DC
Start: 2017-11-04 — End: 2017-11-04

## 2017-11-04 MED ORDER — LISINOPRIL 20 MG PO TABS
20.0000 mg | ORAL_TABLET | Freq: Once | ORAL | Status: AC
  Administered 2017-11-04: 20 mg via ORAL

## 2017-11-04 MED ORDER — HEPARIN (PORCINE) IN NACL 2-0.9 UNITS/ML
INTRAMUSCULAR | Status: AC | PRN
  Administered 2017-11-04: 1000 mL

## 2017-11-04 MED ORDER — BUPROPION HCL ER (SR) 150 MG PO TB12: 150.0000 mg | ORAL_TABLET | Freq: Two times a day (BID) | ORAL | Status: DC

## 2017-11-04 MED ORDER — INSULIN GLARGINE 100 UNIT/ML SOLOSTAR PEN: 35.0000 [IU] | PEN_INJECTOR | Freq: Every day | SUBCUTANEOUS | Status: DC

## 2017-11-04 MED ORDER — NITROGLYCERIN 1 MG/10 ML FOR IR/CATH LAB
INTRA_ARTERIAL | Status: AC
  Filled 2017-11-04: qty 10

## 2017-11-04 MED ORDER — HYDRALAZINE HCL 20 MG/ML IJ SOLN
INTRAMUSCULAR | Status: DC | PRN
  Administered 2017-11-04: 10 mg via INTRAVENOUS

## 2017-11-04 MED ORDER — SODIUM CHLORIDE 0.9% FLUSH: 3.0000 mL | Freq: Two times a day (BID) | INTRAVENOUS | Status: DC

## 2017-11-04 MED ORDER — HYDRALAZINE HCL 20 MG/ML IJ SOLN
INTRAMUSCULAR | Status: AC
  Filled 2017-11-04: qty 1

## 2017-11-04 MED ORDER — PANTOPRAZOLE SODIUM 40 MG PO TBEC: 40.0000 mg | DELAYED_RELEASE_TABLET | Freq: Every day | ORAL | 3 refills | Status: DC

## 2017-11-04 MED ORDER — SODIUM CHLORIDE 0.9 % IV SOLN: 250.0000 mL | INTRAVENOUS | Status: DC | PRN

## 2017-11-04 MED ORDER — ACETAMINOPHEN 325 MG PO TABS: 650.0000 mg | ORAL_TABLET | ORAL | Status: DC | PRN

## 2017-11-04 MED ORDER — SODIUM CHLORIDE 0.9 % IV SOLN
INTRAVENOUS | Status: DC
  Administered 2017-11-04: 08:00:00 via INTRAVENOUS

## 2017-11-04 MED ORDER — HEPARIN SODIUM (PORCINE) 1000 UNIT/ML IJ SOLN
INTRAMUSCULAR | Status: DC | PRN
  Administered 2017-11-04: 4000 [IU] via INTRAVENOUS
  Administered 2017-11-04: 3000 [IU] via INTRAVENOUS
  Administered 2017-11-04: 2000 [IU] via INTRAVENOUS
  Administered 2017-11-04: 3000 [IU] via INTRAVENOUS

## 2017-11-04 MED ORDER — MIDAZOLAM HCL 2 MG/2ML IJ SOLN
INTRAMUSCULAR | Status: DC | PRN
  Administered 2017-11-04: 1 mg via INTRAVENOUS

## 2017-11-04 SURGICAL SUPPLY — 19 items
BALLN SAPPHIRE 2.0X12 (BALLOONS) ×2
BALLN SAPPHIRE ~~LOC~~ 2.75X12 (BALLOONS) ×2 IMPLANT
BALLOON SAPPHIRE 2.0X12 (BALLOONS) ×1 IMPLANT
CATH 5FR JL3.5 JR4 ANG PIG MP (CATHETERS) ×2 IMPLANT
CATH VISTA GUIDE 6FR XBLAD3.5 (CATHETERS) ×2 IMPLANT
COVER PRB 48X5XTLSCP FOLD TPE (BAG) ×1 IMPLANT
COVER PROBE 5X48 (BAG) ×1
DEVICE RAD COMP TR BAND LRG (VASCULAR PRODUCTS) ×2 IMPLANT
GUIDEWIRE INQWIRE 1.5J.035X260 (WIRE) ×1 IMPLANT
GUIDEWIRE PRESSURE COMET II (WIRE) ×2 IMPLANT
INQWIRE 1.5J .035X260CM (WIRE) ×2
KIT ENCORE 26 ADVANTAGE (KITS) ×2 IMPLANT
KIT ESSENTIALS PG (KITS) ×2 IMPLANT
KIT HEART LEFT (KITS) ×2 IMPLANT
PACK CARDIAC CATHETERIZATION (CUSTOM PROCEDURE TRAY) ×2 IMPLANT
SHEATH RAIN RADIAL 21G 6FR (SHEATH) ×2 IMPLANT
STENT SIERRA 2.50 X 15 MM (Permanent Stent) ×2 IMPLANT
TRANSDUCER W/STOPCOCK (MISCELLANEOUS) ×2 IMPLANT
TUBING CIL FLEX 10 FLL-RA (TUBING) ×2 IMPLANT

## 2017-11-04 NOTE — Discharge Summary (Addendum)
Discharge Summary    Patient ID: Sarah Charles,  MRN: [id number], DOB/AGE: May 15, 1961 57 y.o.  Admit date: 11/04/2017 Discharge date: 11/04/2017  Primary Care Provider: Charlott Rakes Primary Cardiologist: Ena Dawley, MD  Discharge Diagnoses    Principal Problem:   Chest pain, precordial Active Problems:   OBESITY, NOS   TOBACCO DEPENDENCE   HYPERTENSION, BENIGN ESSENTIAL   Diabetes mellitus ([place])   Mixed dyslipidemia   Allergies Allergies  Allergen Reactions  . Cymbalta [Duloxetine Hcl] Nausea Only    Nausea, lack of therapeutic effect    Diagnostic Studies/Procedures    Left heart cath 11/04/17:  Prox LAD lesion is 70% stenosed.  Prox Cx to Mid Cx lesion is 30% stenosed.  Prox RCA to Mid RCA lesion is 20% stenosed.  Post intervention, there is a 0% residual stenosis.  A drug-eluting stent was successfully placed using a STENT SIERRA 2.50 X 15 MM.  LV end diastolic pressure is normal.   1. Single vessel obstructive CAD.    - 70% mid LAD immediately after the first diagonal. FFR 0.78 2. Normal LVEDP 3. Successful PCI of the mid LAD with DES  Plan: DAPT for at least 6 months. She is a candidate for same day discharge. Will hold metformin for 48 hours. Will need to switch Prilosec to Protonix.    Echo 10/30/17: Study Conclusions  - Left ventricle: The cavity size was normal. Systolic function was   normal. The estimated ejection fraction was in the range of 55%   to 60%.   History of Present Illness     Sarah Charles is a 57 y.o. female who presented to clinic on 11/02/17 for a pre cath visit.    She has a history significant fordepression, type 2 diabetes,dyslipidemia, hypertension, obesity, ongoing tobacco abuse, and GERD.   She presented on 10/29/17 to the ER at [facility] with chest pressure associated with significant diaphoresis as well as some nausea and shortness of breath. Her FH is quite significant for CAD with father dying at age 42  with MI.  She ruled out for ACS with negative troponin and EKG. She transferred to [facility]. She was seen by cardiology who recommended a coronary CTA which showed moderate CAD with stenosis 50-59% in mid LAD. FFR submitted and shows significant stenosis in the mLAD.   Given her CT coronary results, she was referred for cardiac catheterization.   She presented in clinic for pre-catheterization visit. She presented with her daughter. Husband just had an MI and stents. They both smoke. She is quite emotional - admits she gets easily upset. No more chest pain reported. She does not have NTG on hand. Bp remains up but she is quite upset today. Has trouble affording her medicines.    Hospital Course     Consultants: none  Chest pain, CT coronary with moderate CAD and FFR with significant stenosis in the midLAD.  She presented for heart catheterization 11/04/17.  Heart cath with 70% stenosis in the proximal LAD with FFR of 0.78, treated with DES. Pt tolerated the procedure well. DAPT for at least 6 months.   Will hold metformin for 2 days. Will switch prilosec to protonix.    HTN Pressures were quite elevated today. Continue norvasc, lisinopril, lopressor. Was given 10 mg labetalol in SS. May need medication titration at [place].  Pt will bring blood pressure log.   HLD with LDL goal less than 70 05/30/2017: Cholesterol, Total 222; HDL 33; LDL Calculated Comment; Triglycerides 518  Continue 40 mg lipitor. Repeat lipids at OV. May need to titrate lipitor.    DM2 Last A1c 8.6%, which is improved from 10.8%. She will follow up with her PCP.    Ongoing tobacco use Encouraged cessation.   _____________  Discharge Vitals Blood pressure (!) 170/83, pulse 75, temperature (!) 97.4 F (36.3 C), temperature source Oral, resp. rate 18, height _0  (1.575 m), weight 178 lb (80.7 kg), SpO2 99 %.  Filed Weights   11/04/17 0755  Weight: 178 lb (80.7 kg)    Labs & Radiologic Studies    CBC Recent Labs     11/02/17 1058  WBC 10.6  NEUTROABS 6.0  HGB 14.5  HCT 42.1  MCV 89  PLT 914   Basic Metabolic Panel Recent Labs    11/02/17 1058  NA 137  K 4.1  CL 97  CO2 24  GLUCOSE 230*  BUN 19  CREATININE 0.46*  CALCIUM 10.0   Liver Function Tests Recent Labs    11/02/17 1058  AST 12  ALT 15  ALKPHOS 147*  BILITOT 0.2  PROT 6.9  ALBUMIN 4.4   No results for input(s): LIPASE, AMYLASE in the last 72 hours. Cardiac Enzymes No results for input(s): CKTOTAL, CKMB, CKMBINDEX, TROPONINI in the last 72 hours. BNP Invalid input(s): POCBNP D-Dimer No results for input(s): DDIMER in the last 72 hours. Hemoglobin A1C No results for input(s): HGBA1C in the last 72 hours. Fasting Lipid Panel No results for input(s): CHOL, HDL, LDLCALC, TRIG, CHOLHDL, LDLDIRECT in the last 72 hours. Thyroid Function Tests No results for input(s): TSH, T4TOTAL, T3FREE, THYROIDAB in the last 72 hours.  Invalid input(s): FREET3 _____________  Dg Chest 2 View  Result Date: 10/29/2017 CLINICAL DATA:  Chest pain EXAM: CHEST - 2 VIEW COMPARISON:  July 14, 2016 FINDINGS: Lungs are clear. The heart size and pulmonary vascularity are normal. No pneumothorax. No adenopathy. No bone lesions. IMPRESSION: No edema or consolidation. Electronically Signed   By: Lowella Grip III M.D.   On: 10/29/2017 11:44   Ct Coronary Morph W/cta Cor W/score W/ca W/cm &/or Wo/cm  Addendum Date: 10/31/2017   ADDENDUM REPORT: 10/31/2017 08:16 EXAM: OVER-READ INTERPRETATION  CT CHEST The following report is an over-read performed by radiologist Dr. Norlene Duel [facility] on 10/31/2017. This over-read does not include interpretation of cardiac or coronary anatomy or pathology. The coronary CTA interpretation by the cardiologist is attached. COMPARISON:  None. FINDINGS: No enlarged mediastinal or hilar lymph nodes identified. The visualized portions of the lower airway and esophagus appear normal. No pleural  effusion identified. No pleural effusion. No airspace consolidation, atelectasis or pneumothorax. No acute findings noted within the visualized portions of the upper abdomen. No acute or suspicious bone abnormalities. IMPRESSION: 1. Negative over-read. Electronically Signed   By: Kerby Moors M.D.   On: 10/31/2017 08:16   Result Date: 10/31/2017 CLINICAL DATA:  57 year old female with h/o smoking, diabetes, hypertension, atypical chest pain and family h/o premature CAD. EXAM: Cardiac/Coronary  CT TECHNIQUE: The patient was scanned on a [employer]. FINDINGS: A 120 kV prospective scan was triggered in the descending thoracic aorta at 111 HU's. Axial non-contrast 3 mm slices were carried out through the heart. The data set was analyzed on a dedicated work station and scored using the Vista. Gantry rotation speed was 250 msecs and collimation was .6 mm. No beta blockade and 0.8 mg of sl NTG was given. The 3D data set was reconstructed in 5%  intervals of the 67-82 % of the R-R cycle. Diastolic phases were analyzed on a dedicated work station using MPR, MIP and VRT modes. The patient received 80 cc of contrast. Aorta:  Normal size.  No calcifications.  No dissection. Aortic Valve:  Trileaflet.  No calcifications. Coronary Arteries:  Normal coronary origin.  Right dominance. RCA is a large dominant artery that gives rise to PDA and PLVB. There is mild mixed plaque in the proximal and mid RCA with maximum stenosis 25-50%. Left main is a large artery that gives rise to LAD and LCX arteries. Left main has no plaque. LAD is a large vessel that gives rise to one diagonal artery. There is mild diffuse calcified plaque in the proximal LAD with associated stenosis 25-50%. Mid LAD has moderate predominantly calcified plaque with associated stenosis 50-69%. Mid to distal LAD has no significant plaque. D1 is medium size artery with mild calcified plaque. LCX is a non-dominant artery that gives rise to one  medium size OM1 branch. There is minimal plaque. Other findings: Normal pulmonary vein drainage into the left atrium. Normal let atrial appendage without a thrombus. Normal size of the pulmonary artery. IMPRESSION: 1. Coronary calcium score of 296. This was 78 percentile for age and sex matched control. 2. Normal coronary origin with right dominance. 3. Mild CAD in the proximal andmid RCA and proximal LAD. Moderate CAD with stenosis 50-69% in the mid LAD. Additional analysis with CT FFR will be submitted. Aggressive risk factors modification should be initiated. Electronically Signed: By: Ena Dawley On: 10/30/2017 13:55   Ct Coronary Fractional Flow Reserve Fluid Analysis  Result Date: 10/31/2017 EXAM: FF/RCT ANALYSIS FINDINGS: FFRct analysis was performed on the original cardiac CT angiogram dataset. Diagrammatic representation of the FFRct analysis is provided in a separate PDF document in PACS. This dictation was created using the PDF document and an interactive 3D model of the results. 3D model is not available in the EMR/PACS. Normal FFR range is >0.80. 1. Left Main:  No significant stenosis. 2. LAD: Proximal CT FFR: 0.95, mid: 0.78, distal: 0.68. 3. D1: CT FFR: 0.77. 4. LCX: Proximal CT FFR: 0.96, distal CT FFR: 0.76. 5. RCA: No significant stenosis. IMPRESSION: 1. CT FFR showed significant stenosis in the mid LAD. Cardiac catheterization is recommended. Electronically Signed   By: Ena Dawley   On: 10/31/2017 20:17   Disposition   Pt is being discharged home today in good condition.  Follow-up Plans & Appointments    Follow-up Information    Dorothy Spark, MD Follow up in 1 week(s).   Specialty:  Cardiology Contact information: [place] [address] [phone]          Office will call with appt.   Discharge Instructions    Amb Referral to Cardiac Rehabilitation   Complete by:  As directed    Diagnosis:   Coronary Stents PTCA      Diet - low sodium heart healthy   Complete by:  As directed    Discharge instructions   Complete by:  As directed    No driving for 1 week. No lifting over 5 lbs for 1 week. No sexual activity for 1 week. You may return to work in 1 week. Keep procedure site clean & dry. If you notice increased pain, swelling, bleeding or pus, call/return!  You may shower, but no soaking baths/hot tubs/pools for 1 week.   Increase activity slowly   Complete by:  As directed  Discharge Medications   Allergies as of 11/04/2017      Reactions   Cymbalta [duloxetine Hcl] Nausea Only   Nausea, lack of therapeutic effect      Medication List    STOP taking these medications   omeprazole 20 MG capsule Commonly known as:  PRILOSEC     TAKE these medications   ACCU-CHEK AVIVA PLUS w/Device Kit 1 each by Does not apply route 3 (three) times daily.   accu-chek softclix lancets Use as instructed   amLODipine 5 MG tablet Commonly known as:  NORVASC Take 1 tablet (5 mg total) by mouth daily.   aspirin 81 MG EC tablet Take 1 tablet (81 mg total) by mouth daily.   atorvastatin 40 MG tablet Commonly known as:  LIPITOR Take 1 tablet (40 mg total) by mouth daily. What changed:  when to take this   buPROPion 150 MG 12 hr tablet Commonly known as:  WELLBUTRIN SR Take 1 tablet (150 mg total) by mouth 2 (two) times daily.   cetirizine 10 MG tablet Commonly known as:  ZYRTEC TAKE 1 TABLET (10 MG TOTAL) BY MOUTH DAILY.   clopidogrel 75 MG tablet Commonly known as:  PLAVIX Take 1 tablet (75 mg total) by mouth daily.   clopidogrel 75 MG tablet Commonly known as:  PLAVIX Take 1 tablet (75 mg total) by mouth daily with breakfast. Start taking on:  11/05/2017   gabapentin 300 MG capsule Commonly known as:  NEURONTIN Take 1 capsule (300 mg total) by mouth 2 (two) times daily.   glucose blood test strip Commonly known as:  ACCU-CHEK AVIVA Use as instructed   Insulin Glargine 100 UNIT/ML Solostar  Pen Commonly known as:  LANTUS SOLOSTAR Inject 35 Units into the skin at bedtime.   lisinopril 20 MG tablet Commonly known as:  PRINIVIL,ZESTRIL Take 1 tablet (20 mg total) by mouth daily.   methocarbamol 500 MG tablet Commonly known as:  ROBAXIN Take 1 tablet (500 mg total) by mouth 3 (three) times daily.   metoCLOPramide 10 MG tablet Commonly known as:  REGLAN TAKE 1 TABLET BY MOUTH EVERY 8 (EIGHT) HOURS AS NEEDED FOR NAUSEA OR VOMITING.   metoprolol tartrate 25 MG tablet Commonly known as:  LOPRESSOR Take 1 tablet (25 mg total) by mouth 2 (two) times daily.   nitroGLYCERIN 0.4 MG SL tablet Commonly known as:  NITROSTAT Place 1 tablet (0.4 mg total) under the tongue every 5 (five) minutes as needed for chest pain.   omega-3 acid ethyl esters 1 g capsule Commonly known as:  LOVAZA Take 1 capsule (1 g total) by mouth 2 (two) times daily.   pantoprazole 40 MG tablet Commonly known as:  PROTONIX Take 1 tablet (40 mg total) by mouth daily. Start taking on:  11/05/2017   Pen Needles 31G X 5 MM Misc 35 Units by Does not apply route daily.   sitaGLIPtin-metformin 50-1000 MG tablet Commonly known as:  JANUMET Take 1 tablet by mouth 2 (two) times daily with a meal. Resume in 2 days. What changed:  additional instructions   traZODone 50 MG tablet Commonly known as:  DESYREL Take 1 tablet (50 mg total) by mouth at bedtime as needed for sleep.   TRUEPLUS LANCETS 28G Misc Use as directed   vitamin C 500 MG tablet Commonly known as:  ASCORBIC ACID Take 500 mg by mouth daily.        Acute coronary syndrome (MI, NSTEMI, STEMI, etc) this admission?: No.    Outstanding  Labs/Studies   Needs TCM appt, office will call.   Repeat fasting lipids  Duration of Discharge Encounter   Greater than 30 minutes including physician time.  Signed, [place], [place] 11/04/2017, 3:12 PM

## 2017-11-04 NOTE — Interval H&P Note (Signed)
History and Physical Interval Note:  11/04/2017 9:55 AM  Sarah Charles  has presented today for surgery, with the diagnosis of unstable angina, abnormal coronary CT  The various methods of treatment have been discussed with the patient and family. After consideration of risks, benefits and other options for treatment, the patient has consented to  Procedure(s): LEFT HEART CATH AND CORONARY ANGIOGRAPHY (N/A) as a surgical intervention .  The patient's history has been reviewed, patient examined, no change in status, stable for surgery.  I have reviewed the patient's chart and labs.  Questions were answered to the patient's satisfaction.    Cath Lab Visit (complete for each Cath Lab visit)  Clinical Evaluation Leading to the Procedure:   ACS: No.  Non-ACS:    Anginal Classification: CCS III  Anti-ischemic medical therapy: Maximal Therapy (2 or more classes of medications)  Non-Invasive Test Results: Intermediate-risk stress test findings: cardiac mortality 1-3%/year  Prior CABG: No previous CABG       Theron Aristaeter Methodist Women'S HospitalJordanMD,FACC 11/04/2017 9:56 AM

## 2017-11-04 NOTE — Progress Notes (Signed)
Ed completed with pt and husband. Pt receptive. Discussed stent, Plavix/ASA, restrictions, smoking cessation, diet, ex, NTG, and CRPII. Will send referral to [place] CRPII. Pt is wanting to quit smoking. Accepted fake cigarette and receptive to discussion and advise. Gave resources. Husband just had stents and education was reinforced with him as well. [id number] Ethelda ChickKristan Lion Fernandez CES, ACSM 2:31 PM 11/04/2017

## 2017-11-04 NOTE — Progress Notes (Signed)
Dr SwazilandJordan notified of B/P and per Dr SwazilandJordan will give metoprolol and ok to d/c home

## 2017-11-04 NOTE — Discharge Instructions (Signed)

## 2017-11-04 NOTE — Research (Signed)
CADFEM Informed Consent   Subject Name: Sarah Charles  Subject met inclusion and exclusion criteria.  The informed consent form, study requirements and expectations were reviewed with the subject and questions and concerns were addressed prior to the signing of the consent form.  The subject verbalized understanding of the trail requirements.  The subject agreed to participate in the CADFEM trial and signed the informed consent.  The informed consent was obtained prior to performance of any protocol-specific procedures for the subject.  A copy of the signed informed consent was given to the subject and a copy was placed in the subject's medical record.  [name] 11/04/2017, 08:41 AM

## 2017-11-07 ENCOUNTER — Telehealth: Payer: Self-pay

## 2017-11-07 ENCOUNTER — Encounter (HOSPITAL_COMMUNITY): Payer: Self-pay | Admitting: Cardiology

## 2017-11-07 ENCOUNTER — Ambulatory Visit: Payer: Medicaid Other | Admitting: Family Medicine

## 2017-11-07 MED FILL — Famotidine in NaCl 0.9% IV Soln 20 MG/50ML: INTRAVENOUS | Qty: 50 | Status: AC

## 2017-11-07 MED FILL — Heparin Sod (Porcine)-NaCl IV Soln 1000 Unit/500ML-0.9%: INTRAVENOUS | Qty: 1000 | Status: AC

## 2017-11-07 NOTE — Telephone Encounter (Signed)
**Note De-identified  Obfuscation** -----  **Note De-Identified  Obfuscation** Message from Leonor LivAvaletta L Williams sent at 11/07/2017 10:00 AM EDT ----- Regarding: TOC TOC appointment 07/02 at 10am with Lizabeth LeydenNina Hammond

## 2017-11-07 NOTE — Telephone Encounter (Signed)
I left a message on the pts VM asking her to call me back. 

## 2017-11-08 NOTE — Telephone Encounter (Signed)
[facility].  Patient understands to follow up with provider Dr Delton SeeNelson on November 22, 2017 at [facility]00 pm [facility]. Patient understands discharge instructions? yes Patient understands medications and regiment? yes Patient understands to bring all medications to this visit? yes

## 2017-11-10 ENCOUNTER — Telehealth (HOSPITAL_COMMUNITY): Payer: Self-pay

## 2017-11-10 NOTE — Telephone Encounter (Signed)
[place] [phone]#[id number] [facility].  Will contact patient to see if she is interested in the Cardiac Rehab Program. If interested, patient will be contacted for scheduling upon review by the RN Navigator.

## 2017-11-11 ENCOUNTER — Telehealth: Payer: Self-pay | Admitting: Cardiology

## 2017-11-11 NOTE — Telephone Encounter (Signed)
Pt calling and want to know if she can have a mammogram done after having a stent placement.  Please advise pt

## 2017-11-11 NOTE — Telephone Encounter (Signed)
Advised pt she may have mammogram. She appreciates the return call

## 2017-11-15 ENCOUNTER — Ambulatory Visit: Payer: Medicaid Other | Admitting: Family Medicine

## 2017-11-16 ENCOUNTER — Telehealth (HOSPITAL_COMMUNITY): Payer: Self-pay

## 2017-11-16 NOTE — Telephone Encounter (Signed)
Called and spoke with husband of patient in regards to Cardiac Rehab - He stated she is not home at the time and will have her return call when she gets back home.

## 2017-11-21 MED FILL — BUPROPION SR 150 MG TABLET: 150 | 30 days supply | Qty: 60 | Fill #2

## 2017-11-21 MED FILL — CETIRIZINE HCL 10 MG TABS: 10 | 30 days supply | Qty: 30 | Fill #1

## 2017-11-21 MED FILL — LISINOPRIL 20 MG TAB: 20 | 30 days supply | Qty: 30 | Fill #0

## 2017-11-21 MED FILL — METHOCARBAMOL 500 MG TABS: 500 | 30 days supply | Qty: 90 | Fill #1

## 2017-11-22 ENCOUNTER — Encounter: Payer: Self-pay | Admitting: Cardiology

## 2017-11-22 ENCOUNTER — Ambulatory Visit: Payer: Medicaid Other | Admitting: Cardiology

## 2017-11-22 VITALS — BP 128/74 | HR 76 | Ht 62.0 in | Wt 183.1 lb

## 2017-11-22 DIAGNOSIS — I10 Essential (primary) hypertension: Secondary | ICD-10-CM

## 2017-11-22 DIAGNOSIS — E785 Hyperlipidemia, unspecified: Secondary | ICD-10-CM

## 2017-11-22 DIAGNOSIS — Z95820 Peripheral vascular angioplasty status with implants and grafts: Secondary | ICD-10-CM | POA: Diagnosis not present

## 2017-11-22 DIAGNOSIS — E782 Mixed hyperlipidemia: Secondary | ICD-10-CM | POA: Diagnosis not present

## 2017-11-22 DIAGNOSIS — I2510 Atherosclerotic heart disease of native coronary artery without angina pectoris: Secondary | ICD-10-CM | POA: Insufficient documentation

## 2017-11-22 DIAGNOSIS — F17200 Nicotine dependence, unspecified, uncomplicated: Secondary | ICD-10-CM | POA: Diagnosis not present

## 2017-11-22 DIAGNOSIS — E1169 Type 2 diabetes mellitus with other specified complication: Secondary | ICD-10-CM | POA: Diagnosis not present

## 2017-11-22 MED ORDER — METOPROLOL TARTRATE 25 MG PO TABS: 25.0000 mg | ORAL_TABLET | Freq: Two times a day (BID) | ORAL | 12 refills | Status: DC

## 2017-11-22 MED ORDER — AMLODIPINE BESYLATE 5 MG PO TABS: 5.0000 mg | ORAL_TABLET | Freq: Every day | ORAL | 12 refills | Status: DC

## 2017-11-22 MED ORDER — ASPIRIN 81 MG PO TBEC: 81.0000 mg | DELAYED_RELEASE_TABLET | Freq: Every day | ORAL | 12 refills | Status: AC

## 2017-11-22 MED FILL — ACCU-CHEK AVIVA PLUS TEST S: 30 days supply | Qty: 100 | Fill #2

## 2017-11-22 MED FILL — traZODone HCL 50 MG TABS: 50 | 30 days supply | Qty: 30 | Fill #1

## 2017-11-22 MED FILL — TRUEPLUS PEN NDL 31GX3/16": 31G X 5 MM | 30 days supply | Qty: 100 | Fill #2

## 2017-11-22 MED FILL — ACCU-CHEK SOFTCLIX LANCETS: 30 days supply | Qty: 100 | Fill #2

## 2017-11-22 MED FILL — TRUEPLUS PEN NDL 31GX3/16: 31G X 5 MM | 30 days supply | Qty: 100 | Fill #2

## 2017-11-22 NOTE — Progress Notes (Signed)
Cardiology Office Note:    Date:  11/22/2017   ID:  Sarah Charles, DOB 11/27/1960, MRN [id number]  PCP:  Sarah Rakes, MD  Cardiologist:  Sarah Dawley, MD  Referring MD: Sarah Rakes, MD   Chief Complaint  Patient presents with  . Hospitalization Follow-up    Post stent    History of Present Illness:    Sarah Charles is a 57 y.o. female with a past medical history significant for chin, diabetes type 2, hyperlipidemia, obesity, tobacco abuse, depression, GERD and CAD S/P DES to prox LAD 10/2017.  [name] had gone to the [facility], ED 10/29/2017 for evaluation of chest pressure with diaphoresis, nausea and shortness of breath.  She has a strong family history significant for CAD with her father dying at age 38 of MI.  She ruled out for MI and was transferred to [facility] where she underwent cardiac CTA that showed significant mid LAD stenosis by FFR.  She was discharged from the hospital and came in later for outpatient cardiac catheterization on 11/04/2017 which revealed 75% stenosis of the proximal LAD that was treated with a drug-eluting stent.  She was started on dual antiplatelet therapy for at least 6 months.  Her blood pressures were elevated during her hospitalization  Her husband had 3 stents 2 months ago and this is what prompted her to have her symptoms evaluated. She also notes that she had had left arm pain. Since her stent she has had some dull chest pain sometimes at night. She has taken NTG once since she was discharged. Her chest pain is mild and vague. This does not feel like her discomfort prior to the stent. No further left arm pain. She has gotten back to her daily activities and feels a lot better than prior to the stent. No chest pain or shortness of breath with exertion.   Smokes ~1/2 PPD.    Past Medical History:  Diagnosis Date  . Arthritis   . Coronary artery calcification seen on CT scan    a. 07/2017 noted on CT abd.  . Depression   . GERD  (gastroesophageal reflux disease)   . Hyperlipidemia   . Hypertension   . Obesity   . Sleep apnea    a. Does not use CPAP "cause i dont think I need it anymore".  . Tobacco abuse   . Type II diabetes mellitus ([place])     Past Surgical History:  Procedure Laterality Date  . CHOLECYSTECTOMY    . CORONARY STENT INTERVENTION N/A 11/04/2017   Procedure: CORONARY STENT INTERVENTION;  Surgeon: Martinique, Peter M, MD;  Location: [facility];  Service: Cardiovascular;  Laterality: N/A;  . INCISION AND DRAINAGE ABSCESS Left 12/22/2012   Procedure: INCISION AND DRAINAGE ABSCESS;  Surgeon: Sarah So, MD;  Location: [facility];  Service: General;  Laterality: Left;  . INTRAVASCULAR PRESSURE WIRE/FFR STUDY N/A 11/04/2017   Procedure: INTRAVASCULAR PRESSURE WIRE/FFR STUDY;  Surgeon: Martinique, Peter M, MD;  Location: [facility];  Service: Cardiovascular;  Laterality: N/A;  . LEFT HEART CATH AND CORONARY ANGIOGRAPHY N/A 11/04/2017   Procedure: LEFT HEART CATH AND CORONARY ANGIOGRAPHY;  Surgeon: Martinique, Peter M, MD;  Location: [facility];  Service: Cardiovascular;  Laterality: N/A;  . TONSILLECTOMY    . TUBAL LIGATION      Current Medications: Current Meds  Medication Sig  . amLODipine (NORVASC) 5 MG tablet Take 1 tablet (5 mg total) by mouth daily.  Marland Kitchen aspirin 81  MG EC tablet Take 1 tablet (81 mg total) by mouth daily.  Marland Kitchen atorvastatin (LIPITOR) 40 MG tablet Take 1 tablet (40 mg total) by mouth daily.  . Blood Glucose Monitoring Suppl (ACCU-CHEK AVIVA PLUS) w/Device KIT 1 each by Does not apply route 3 (three) times daily.  Marland Kitchen buPROPion (WELLBUTRIN SR) 150 MG 12 hr tablet Take 1 tablet (150 mg total) by mouth 2 (two) times daily.  . cetirizine (ZYRTEC) 10 MG tablet TAKE 1 TABLET (10 MG TOTAL) BY MOUTH DAILY.  Marland Kitchen clopidogrel (PLAVIX) 75 MG tablet Take 1 tablet (75 mg total) by mouth daily.  Marland Kitchen gabapentin (NEURONTIN) 300 MG capsule Take 1 capsule (300 mg total) by mouth 2 (two) times daily.    Marland Kitchen glucose blood (ACCU-CHEK AVIVA) test strip Use as instructed  . Insulin Glargine (LANTUS SOLOSTAR) 100 UNIT/ML Solostar Pen Inject 35 Units into the skin at bedtime.  . Insulin Pen Needle (PEN NEEDLES) 31G X 5 MM MISC 35 Units by Does not apply route daily.  Elmore Guise Devices (ACCU-CHEK SOFTCLIX) lancets Use as instructed  . lisinopril (PRINIVIL,ZESTRIL) 20 MG tablet Take 1 tablet (20 mg total) by mouth daily.  . methocarbamol (ROBAXIN) 500 MG tablet Take 1 tablet (500 mg total) by mouth 3 (three) times daily.  . metoCLOPramide (REGLAN) 10 MG tablet TAKE 1 TABLET BY MOUTH EVERY 8 (EIGHT) HOURS AS NEEDED FOR NAUSEA OR VOMITING.  . metoprolol tartrate (LOPRESSOR) 25 MG tablet Take 1 tablet (25 mg total) by mouth 2 (two) times daily.  . nitroGLYCERIN (NITROSTAT) 0.4 MG SL tablet Place 1 tablet (0.4 mg total) under the tongue every 5 (five) minutes as needed for chest pain.  Marland Kitchen omega-3 acid ethyl esters (LOVAZA) 1 g capsule Take 1 capsule (1 g total) by mouth 2 (two) times daily.  . pantoprazole (PROTONIX) 40 MG tablet Take 1 tablet (40 mg total) by mouth daily.  . sitaGLIPtin-metformin (JANUMET) 50-1000 MG tablet Take 1 tablet by mouth 2 (two) times daily with a meal. Resume in 2 days.  . traZODone (DESYREL) 50 MG tablet Take 1 tablet (50 mg total) by mouth at bedtime as needed for sleep.  . TRUEPLUS LANCETS 28G MISC Use as directed  . vitamin C (ASCORBIC ACID) 500 MG tablet Take 500 mg by mouth daily.  . [DISCONTINUED] amLODipine (NORVASC) 5 MG tablet Take 1 tablet (5 mg total) by mouth daily.  . [DISCONTINUED] aspirin 81 MG EC tablet Take 1 tablet (81 mg total) by mouth daily.  . [DISCONTINUED] metoprolol tartrate (LOPRESSOR) 25 MG tablet Take 1 tablet (25 mg total) by mouth 2 (two) times daily.     Allergies:   Cymbalta [duloxetine hcl]   Social History   Socioeconomic History  . Marital status: Married    Spouse name: Not on file  . Number of children: Not on file  . Years of  education: Not on file  . Highest education level: Not on file  Occupational History  . Not on file  Social Needs  . Financial resource strain: Not on file  . Food insecurity:    Worry: Not on file    Inability: Not on file  . Transportation needs:    Medical: Not on file    Non-medical: Not on file  Tobacco Use  . Smoking status: Current Every Day Smoker    Packs/day: 1.00    Years: 41.00    Pack years: 41.00    Types: Cigarettes  . Smokeless tobacco: Never Used  Substance and  Sexual Activity  . Alcohol use: No  . Drug use: No  . Sexual activity: Never    Birth control/protection: None  Lifestyle  . Physical activity:    Days per week: Not on file    Minutes per session: Not on file  . Stress: Not on file  Relationships  . Social connections:    Talks on phone: Not on file    Gets together: Not on file    Attends religious service: Not on file    Active member of club or organization: Not on file    Attends meetings of clubs or organizations: Not on file    Relationship status: Not on file  Other Topics Concern  . Not on file  Social History Narrative   Lives in [place] with husband.  Unemployed.  Does not routinely exercise.     Family History: The patient's family history includes CAD in her father; COPD in her mother; CVA in her mother; Cancer in her maternal grandfather and maternal uncle; Diabetes in her maternal grandmother and sister; Heart attack in her father and mother; Heart disease in her mother; Hypercholesterolemia in her father; Hypertension in her mother; Kidney Stones in her mother. ROS:   Please see the history of present illness.     All other systems reviewed and are negative.  EKGs/Labs/Other Studies Reviewed:    The following studies were reviewed today:    Left heart cath 11/04/17:  Prox LAD lesion is 70% stenosed.  Prox Cx to Mid Cx lesion is 30% stenosed.  Prox RCA to Mid RCA lesion is 20% stenosed.  Post intervention, there is  a 0% residual stenosis.  A drug-eluting stent was successfully placed using a STENT SIERRA 2.50 X 15 MM.  LV end diastolic pressure is normal.  1. Single vessel obstructive CAD. - 70% mid LAD immediately after the first diagonal. FFR 0.78 2. Normal LVEDP 3. Successful PCI of the mid LAD with DES  Plan: DAPT for at least 6 months. She is a candidate for same day discharge. Will hold metformin for 48 hours. Will need to switch Prilosec to Protonix.    Echo 10/30/17: Study Conclusions  - Left ventricle: The cavity size was normal. Systolic function was normal. The estimated ejection fraction was in the range of 55% to 60%.   EKG:  EKG is not ordered today.    Recent Labs: 10/29/2017: TSH 0.965 11/02/2017: ALT 15; BUN 19; Creatinine, Ser 0.46; Hemoglobin 14.5; Platelets 277; Potassium 4.1; Sodium 137   Recent Lipid Panel    Component Value Date/Time   CHOL 222 (H) 05/30/2017 1627   TRIG 518 (H) 05/30/2017 1627   HDL 33 (L) 05/30/2017 1627   CHOLHDL 6.7 (H) 05/30/2017 1627   CHOLHDL 9.7 (H) 07/23/2016 1638   VLDL NOT CALC 07/23/2016 1638   [place] Comment 05/30/2017 1627    Physical Exam:    VS:  BP 128/74   Pulse 76   Ht _0  (1.575 Charles)   Wt 183 lb 1.9 oz (83.1 kg)   SpO2 97%   BMI 33.49 kg/Charles     Wt Readings from Last 3 Encounters:  11/22/17 183 lb 1.9 oz (83.1 kg)  11/04/17 178 lb (80.7 kg)  11/02/17 179 lb 12.8 oz (81.6 kg)     Physical Exam  Constitutional: She is oriented to person, place, and time. She appears well-developed and well-nourished. No distress.  HENT:  Head: Normocephalic and atraumatic.  Neck: Normal range of motion. Neck supple.  No JVD present.  Cardiovascular: Normal rate, regular rhythm, normal heart sounds and intact distal pulses. Exam reveals no gallop and no friction rub.  No murmur heard. Pulmonary/Chest: Effort normal and breath sounds normal. No respiratory distress. She has no wheezes. She has no rales.  Abdominal: Soft.  Bowel sounds are normal.  Musculoskeletal: Normal range of motion. She exhibits no edema.  Neurological: She is alert and oriented to person, place, and time.  Skin: Skin is warm and dry.  Psychiatric: She has a normal mood and affect. Her behavior is normal. Thought content normal.  Vitals reviewed.  Right radial cath site healed.    ASSESSMENT:    1. Coronary artery disease involving native coronary artery of native heart without angina pectoris   2. S/P angioplasty with stent   3. HYPERTENSION, BENIGN ESSENTIAL   4. Mixed dyslipidemia   5. Type 2 diabetes mellitus with hyperlipidemia ([place])   6. TOBACCO DEPENDENCE    PLAN:    In order of problems listed above:  CAD status post DES to mid LAD:  Now on dual antiplatelet therapy for at least 6 months, clopidogrel 75 mg and aspirin 81 mg.  Also on beta-blocker, statin and ACE inhibitor. Has had some mild, vague dull chest pain but no exertional chest pain or shortness of breath. She states that she feels a lot better since her stent and is able to be more active.   Hypertension: Pressures were elevated in the hospital.  She was continued on Norvasc, lisinopril, Lopressor. BP well controlled today. Continue current therapy.   Hyperlipidemia: LDL goal of less than 70.  In January her LDL was not calculated due to high triglycerides.  She is continued on atorvastatin 40 mg and Lovaza.  Will repeat fasting lipid panel and adjust medication as needed.  Diabetes type II on insulin: Last hemoglobin A1c was 8.6, has been 10.8 prior to that. Try to get A1c under 7.   Tobacco abuse: Smokes ~1/2 PPD. Is going to try to quit. Has just gottern nictonine patcheis from PCP. We discussed hazards of smoking and some tips for quitting.    Medication Adjustments/Labs and Tests Ordered: Current medicines are reviewed at length with the patient today.  Concerns regarding medicines are outlined above. Labs and tests ordered and medication changes are  outlined in the patient instructions below:  Patient Instructions  Medication Instructions: Your physician recommends that you continue on your current medications as directed. Please refer to the Current Medication list given to you today.   Labwork: Your physician recommends that you return for a FASTING lipid profile: on 12/02/17 our lab is open between 8 am - 5 pm  Procedures/Testing: None Ordered  Follow-Up: Your physician recommends that you schedule a follow-up appointment in: 3 months with Dr.Nelson    Any Additional Special Instructions Will Be Listed Below (If Applicable).     If you need a refill on your cardiac medications before your next appointment, please call your pharmacy.  Peligros de fumar (Smoking Hazards) Fumar cigarrillos es extremadamente malo para su salud. El humo del tabaco est compuesto por ms de 200 venenos conocidos. Contiene gases venenosos xido de nitrgeno y monxido de carbono. Hay ms de 60 sustancias qumicas en el humo del tabaco que causan cncer. Algunas de las sustancias qumicas halladas en el humo del cigarrillo son:  Cianuro.  Benceno.  Formaldehido.  Metanol (alcohol de la [place]).  Acetileno (combustible usado en sopletes).  AmonacoMemory Dance fumar muy poco acorta la  expectativa de vida en varios aos. Usted puede reducir significativamente el riesgo de problemas mdicos para usted y su familia si deja de fumar ahora. El hbito de fumar es la causa de [place] y enfermedad dentro de nuestra sociedad que mejor se IT trainer. Pasados unos [employer] de haber dejado de fumar, la circulacin [place], el riesgo de tener un ataque cardaco [place], y la capacidad de sus pulmones [place]. Podr haber un aumento de la flema los primeros das despus de dejarlo, y podr tomar meses para que sus pulmones se limpien completamente. Pasados 10 aos despus de haber dejado de fumar, el riesgo de Horticulturist, commercial de pulmn se iguala casi al  de un no fumador. [place] LOS RIESGOS DE FUMAR? Los fumadores de cigarrillos tienen un aumento en el riesgo de tener problemas mdicos graves, entre los que se incluyen:  Hotel manager de pulmn.  Enfermedades pulmonares (neumona, bronquitis, enfisema).  Dolor en el pecho debido a que el corazn no obtiene la cantidad suficiente de oxgeno (angina).  Enfermedades cardacas y de los vasos sanguneos perifricos.  Hipertensin arterial.  Ictus.  Cncer bucal (labios, boca, laringe).  Cncer de vejiga.  Cncer de pncreas.  Cncer de cuello uterino.  Complicaciones del embarazo, incluyendo parto prematuro.  Bebs que nacen ms pequeos o muertos, defectos del nacimiento y dao gentico al esperma.  Menopausia precoz.  Bajo nivel de estrgenos en la mujer.  Infertilidad.  Arrugas faciales.  Ceguera.  Aumento del riesgo de romperse huesos (fracturas).  Demencia senil.  lceras de estmago y hemorragias internas.  Retardo en el tiempo de curacin de las heridas y riesgo de complicaciones durante [place]. Debido a la exposicin al humo de segunda mano, los hijos de fumadores tienen los siguientes riesgos :  Sndrome de muerte sbita del Pharmacist, hospital.  Infecciones respiratorias.  Cncer de pulmn.  Enfermedades cardacas.  Infecciones en los odos. [place]? La nicotina es la sustancia qumica del tabaco que puede causar adiccin o dependencia. Cuando usted fuma e inhala, la nicotina se absorbe rpidamente en el torrente sanguneo a travs de los pulmones. Tanto si se inhala como si no se inhala, la nicotina es [place]. cules son las ventajas de dejar de fumar? El dejar de fumar trae muchos beneficios para la salud. Algunos de ellos son:  Disminuye la probabilidad de sufrir cncer y enfermedades cardacas. Los beneficios para la salud son casi inmediatos.  La presin arterial, la frecuencia cardaca y respiratoria comienzan a volver a los  patrones normales.  Pueden verse mejorada la calidad de vida. [place]? Fumar es una adiccin con efectos fsicos y psicolgicos y los hbitos a largo plazo pueden ser difciles de [place] manager. El mdico tambin podr indicar:  Programas y recursos de la comunidad, que pueden incluir 61 de [place], educacin o terapias.  Reemplazos de nicotina, como parches, goma de Higher education careers adviser y [employer]. selos segn las indicaciones. No reemplace el fumar cigarrillos con cigarrillos electrnicos. La seguridad de los Psychologist, sport and exercise se desconoce, y podran tener qumicos txicos. Marthenia Rolling MS INFORMACIN  American Lung Association (Asociacin Estadounidense del Pulmn): www.lung.[place] (Asociacin Estadounidense del cncer) www.cancer.org  Esta informacin no tiene Marine scientist el consejo del mdico. Asegrese de hacerle al mdico cualquier pregunta que tenga. Document Released: 05/10/2005 Document Revised: 09/01/2015 Document Reviewed: 10/30/2012 Elsevier Interactive Patient Education  2017 Elsevier Inc. Coronary Artery Disease, Female Coronary artery disease (CAD) is a condition in which the arteries that lead to  the heart (coronary arteries) become narrow or blocked. The narrowing or blockage can lead to decreased blood flow to the heart. Prolonged reduced blood flow can cause a heart attack (myocardial infarction or MI). This condition may also be called coronary heart disease. Because CAD is the leading cause of death in women, it is important to understand what causes this condition and how it is treated. What are the causes? CAD is most often caused by atherosclerosis. This is the buildup of fat and cholesterol (plaque) on the inside of the arteries. Over time, the plaque may narrow or block the artery, reducing blood flow to the heart. Plaque can also become weak and break off within a coronary artery and cause a sudden blockage. Other less  common causes of CAD include:  An embolism or blood clot in a coronary artery.  A tearing of the artery (spontaneous coronary artery dissection).  An aneurysm.  Inflammation (vasculitis) in the artery wall.  What increases the risk? The following factors may make you more likely to develop this condition:  Age. Women over age 8 are at a greater risk of CAD.  Family history of CAD.  High blood pressure (hypertension).  Diabetes.  High cholesterol levels.  Tobacco use.  Lack of exercise.  Menopause. ? All postmenopausal women are at greater risk of CAD. ? Women who have experienced menopause between the ages of 60-45 (early menopause) are at a higher risk of CAD. ? Women who have experienced menopause before age 43 (premature menopause) are at a very high risk of CAD.  Excessive alcohol use  A diet high in saturated and trans fats, such as fried food and processed meat.  Other possible risk factors include:  High stress levels.  Depression  Obesity.  Sleep apnea.  What are the signs or symptoms? Many people do not have any symptoms during the early stages of CAD. As the condition progresses, symptoms may include:  Chest pain (angina). The pain can: ? Feel like crushing or squeezing, or a tightness, pressure, fullness, or heaviness in the chest. ? Last more than a few minutes or can stop and recur. The pain tends to get worse with exercise or stress and to fade with rest.  Pain in the arms, neck, jaw, or back.  Unexplained heartburn or indigestion.  Shortness of breath.  Nausea.  Sudden cold sweats.  Sudden light-headedness.  Fluttering or fast heartbeat (palpitations).  Many women have chest discomfort and the other symptoms. However, women often have unusual (atypical) symptoms, such as:  Fatigue.  Vomiting.  Unexplained feelings of nervousness or anxiety.  Unexplained weakness.  Dizziness or fainting.  How is this diagnosed? This  condition is diagnosed based on:  Your family and medical history.  A physical exam.  Tests, including: ? A test to check the electrical signals in your heart (electrocardiogram). ? Exercise stress test. This looks for signs of blockage when the heart is stressed with exercise, such as running on a treadmill. ? Pharmacologic stress test. This test looks for signs of blockage when the heart is being stressed with a medicine. ? Blood tests. ? Coronary angiogram. This is a procedure to look at the coronary arteries to see if there is any blockage. During this test, a dye is injected into your arteries Charles they appear on an X-ray. ? A test that uses sound waves to take a picture of your heart (echocardiogram). ? Chest X-ray.  How is this treated? This condition may be treated by:  Healthy lifestyle changes to reduce risk factors.  Medicines such as: ? Antiplatelet medicines and blood-thinning medicines, such as aspirin. These help prevent blood clots. ? Nitroglycerin. ? Blood pressure medicines. ? Cholesterol-lowering medicine.  Coronary angioplasty and stenting. During this procedure, a thin, flexible tube is inserted through a blood vessel and into a blocked artery. A balloon or similar device on the end of the tube is inflated to open up the artery. In some cases, a small, mesh tube (stent) is inserted into the artery to keep it open.  Coronary artery bypass surgery. During this surgery, veins or arteries from other parts of the body are used to create a bypass around the blockage and allow blood to reach your heart.  Follow these instructions at home: Medicines  Take over-the-counter and prescription medicines only as told by your health care provider.  Do not take the following medicines unless your health care provider approves: ? NSAIDs, such as ibuprofen, naproxen, or celecoxib. ? Vitamin supplements that contain vitamin A, vitamin E, or both. ? Hormone replacement therapy  that contains estrogen with or without progestin. Lifestyle  Follow an exercise program approved by your health care provider. Aim for 150 minutes of moderate exercise or 75 minutes of vigorous exercise each week.  Maintain a healthy weight or lose weight as approved by your health care provider.  Rest when you are tired.  Learn to manage stress or try to limit your stress. Ask your health care provider for suggestions if you need help.  Get screened for depression and seek treatment, if needed.  Do not use any products that contain nicotine or tobacco, such as cigarettes and e-cigarettes. If you need help quitting, ask your health care provider.  Do not use illegal drugs. Eating and drinking  Follow a heart-healthy diet. A dietitian can help educate you about healthy food options and changes. In general, eat plenty of fruits and vegetables, lean meats, and whole grains.  Avoid foods high in: ? Sugar. ? Salt (sodium). ? Saturated fats, such as processed or fatty meat. ? Trans fats, such as fried food.  Use healthy cooking methods such as roasting, grilling, broiling, baking, poaching, steaming, or stir-frying.  If you drink alcohol, and your health care provider approves, limit your alcohol intake to no more than 1 drink per day. One drink equals 12 ounces of beer, 5 ounces of wine, or 1 ounces of hard liquor. General instructions  Manage any other health conditions, such as hypertension and diabetes. These conditions affect your heart.  Your health care provider may ask you to monitor your blood pressure. Ideally, your blood pressure should be below 130/80.  Keep all follow-up visits as told by your health care provider. This is important. Get help right away if:  You have pain in your chest, neck, arm, jaw, stomach, or back that: ? Lasts more than a few minutes. ? Is recurring. ? Is not relieved by taking medicine under your tongue (sublingualnitroglycerin).  You have  profuse sweating without cause.  You have unexplained: ? Heartburn or indigestion. ? Shortness of breath or difficulty breathing. ? Fluttering or fast heartbeat (palpitations). ? Nausea or vomiting. ? Fatigue. ? Feelings of nervousness or anxiety. ? Weakness. ? Diarrhea.  You have sudden light-headedness or dizziness.  You faint.  You feel like hurting yourself or think about taking your own life. These symptoms may represent a serious problem that is an emergency. Do not wait to see if the symptoms will go away.  Get medical help right away. Call your local emergency services (911 in the [place] Do not drive yourself to the hospital. Summary  Coronary artery disease (CAD) is a process in which the arteries that lead to the heart (coronary arteries) become narrow or blocked. The narrowing or blockage can lead to a heart attack.  Many women have chest discomfort and other common symptoms of CAD. However, women often have different (atypical) symptoms, such as fatigue, vomiting, and dizziness or weakness.  CAD can be treated with lifestyle changes, medicines, surgery, or a combination of these treatments. This information is not intended to replace advice given to you by your health care provider. Make sure you discuss any questions you have with your health care provider. Document Released: 08/02/2011 Document Revised: 04/30/2016 Document Reviewed: 04/30/2016 Elsevier Interactive Patient Education  2018 [place], Daune Perch, NP  11/22/2017 6:13 PM    [facility]

## 2017-11-22 NOTE — Patient Instructions (Addendum)
Medication Instructions: Your physician recommends that you continue on your current medications as directed. Please refer to the Current Medication list given to you today.   Labwork: Your physician recommends that you return for a FASTING lipid profile: on 12/02/17 our lab is open between 8 am - 5 pm  Procedures/Testing: None Ordered  Follow-Up: Your physician recommends that you schedule a follow-up appointment in: 3 months with Dr.Nelson    Any Additional Special Instructions Will Be Listed Below (If Applicable).     If you need a refill on your cardiac medications before your next appointment, please call your pharmacy.  Peligros de fumar (Smoking Hazards) Fumar cigarrillos es extremadamente malo para su salud. El humo del tabaco est compuesto por ms de 200 venenos conocidos. Contiene gases venenosos xido de nitrgeno y monxido de carbono. Hay ms de 60 sustancias qumicas en el humo del tabaco que causan cncer. Algunas de las sustancias qumicas halladas en el humo del cigarrillo son:  Cianuro.  Benceno.  Formaldehido.  Metanol (alcohol de la [place]ad[place]).  Acetileno (combustible usado en sopletes).  AmonacoBaron Sane. Incluso fumar muy poco acorta la expectativa de vida en varios aos. Usted puede reducir significativamente el riesgo de problemas mdicos para usted y su familia si deja de fumar ahora. El hbito de fumar es la causa de [place] y enfermedad dentro de nuestra sociedad que mejor se Geophysicist/field seismologistpuede prevenir. Pasados unos American International Groupdas despus de haber dejado de fumar, la circulacin [place]ora, el riesgo de tener un ataque cardaco [place]uye, y la capacidad de sus pulmones [place]. Podr haber un aumento de la flema los primeros das despus de dejarlo, y podr tomar meses para que sus pulmones se limpien completamente. Pasados 10 aos despus de haber dejado de fumar, el riesgo de Geophysical data processordesarrollar cncer de pulmn se iguala casi al de un no fumador. CULES SON LOS RIESGOS DE FUMAR? Los fumadores  de cigarrillos tienen un aumento en el riesgo de tener problemas mdicos graves, entre los que se incluyen:  Database administratorCncer de pulmn.  Enfermedades pulmonares (neumona, bronquitis, enfisema).  Dolor en el pecho debido a que el corazn no obtiene la cantidad suficiente de oxgeno (angina).  Enfermedades cardacas y de los vasos sanguneos perifricos.  Hipertensin arterial.  Ictus.  Cncer bucal (labios, boca, laringe).  Cncer de vejiga.  Cncer de pncreas.  Cncer de cuello uterino.  Complicaciones del embarazo, incluyendo parto prematuro.  Bebs que nacen ms pequeos o muertos, defectos del nacimiento y dao gentico al esperma.  Menopausia precoz.  Bajo nivel de estrgenos en la mujer.  Infertilidad.  Arrugas faciales.  Ceguera.  Aumento del riesgo de romperse huesos (fracturas).  Demencia senil.  lceras de estmago y hemorragias internas.  Retardo en el tiempo de curacin de las heridas y riesgo de complicaciones durante [place] ciruga. Debido a la exposicin al humo de [address]segunda mano, los hijos de fumadores tienen los siguientes riesgos :  Sndrome de muerte sbita del Microbiologistlactante.  Infecciones respiratorias.  Cncer de pulmn.  Enfermedades cardacas.  Infecciones en los odos. PORQU EL FUMAR ES ADICTIVO? La nicotina es la sustancia qumica del tabaco que puede causar adiccin o dependencia. Cuando usted fuma e inhala, la nicotina se absorbe rpidamente en el torrente sanguneo a travs de los pulmones. Tanto si se inhala como si no se inhala, la nicotina es [place]ictiva. cules son las ventajas de dejar de fumar? El dejar de fumar trae muchos beneficios para la salud. Algunos de ellos son:  Disminuye la probabilidad de sufrir cncer y enfermedades cardacas.  Los beneficios para la salud son casi inmediatos.  La presin arterial, la frecuencia cardaca y respiratoria comienzan a volver a los patrones normales.  Pueden verse mejorada la calidad de vida. CMO  DEJAR DE FUMAR? Fumar es una adiccin con efectos fsicos y psicolgicos y los hbitos a largo plazo pueden ser difciles de Multimedia programmer. El mdico tambin podr indicar:  Programas y recursos de la comunidad, que pueden incluir apoyo de [place]ab[place]ones, educacin o terapias.  Reemplazos de nicotina, como parches, goma de Theatre manager y [employer]. selos segn las indicaciones. No reemplace el fumar cigarrillos con cigarrillos electrnicos. La seguridad de los Administrator, Civil Service se desconoce, y podran tener qumicos txicos. Irven Shelling MS INFORMACIN  American Lung Association (Asociacin Estadounidense del Pulmn): www.lung.org  American Cancer Society (Asociacin Estadounidense del cncer) www.cancer.org  Esta informacin no tiene Theme park manager el consejo del mdico. Asegrese de hacerle al mdico cualquier pregunta que tenga. Document Released: 05/10/2005 Document Revised: 09/01/2015 Document Reviewed: 10/30/2012 Elsevier Interactive Patient Education  2017 Elsevier Inc. Coronary Artery Disease, Female Coronary artery disease (CAD) is a condition in which the arteries that lead to the heart (coronary arteries) become narrow or blocked. The narrowing or blockage can lead to decreased blood flow to the heart. Prolonged reduced blood flow can cause a heart attack (myocardial infarction or MI). This condition may also be called coronary heart disease. Because CAD is the leading cause of death in women, it is important to understand what causes this condition and how it is treated. What are the causes? CAD is most often caused by atherosclerosis. This is the buildup of fat and cholesterol (plaque) on the inside of the arteries. Over time, the plaque may narrow or block the artery, reducing blood flow to the heart. Plaque can also become weak and break off within a coronary artery and cause a sudden blockage. Other less common causes of CAD include:  An embolism or blood clot in a coronary  artery.  A tearing of the artery (spontaneous coronary artery dissection).  An aneurysm.  Inflammation (vasculitis) in the artery wall.  What increases the risk? The following factors may make you more likely to develop this condition:  Age. Women over age 48 are at a greater risk of CAD.  Family history of CAD.  High blood pressure (hypertension).  Diabetes.  High cholesterol levels.  Tobacco use.  Lack of exercise.  Menopause. ? All postmenopausal women are at greater risk of CAD. ? Women who have experienced menopause between the ages of 13-45 (early menopause) are at a higher risk of CAD. ? Women who have experienced menopause before age 4 (premature menopause) are at a very high risk of CAD.  Excessive alcohol use  A diet high in saturated and trans fats, such as fried food and processed meat.  Other possible risk factors include:  High stress levels.  Depression  Obesity.  Sleep apnea.  What are the signs or symptoms? Many people do not have any symptoms during the early stages of CAD. As the condition progresses, symptoms may include:  Chest pain (angina). The pain can: ? Feel like crushing or squeezing, or a tightness, pressure, fullness, or heaviness in the chest. ? Last more than a few minutes or can stop and recur. The pain tends to get worse with exercise or stress and to fade with rest.  Pain in the arms, neck, jaw, or back.  Unexplained heartburn or indigestion.  Shortness of breath.  Nausea.  Sudden cold sweats.  Sudden light-headedness.  Fluttering or fast heartbeat (palpitations).  Many women have chest discomfort and the other symptoms. However, women often have unusual (atypical) symptoms, such as:  Fatigue.  Vomiting.  Unexplained feelings of nervousness or anxiety.  Unexplained weakness.  Dizziness or fainting.  How is this diagnosed? This condition is diagnosed based on:  Your family and medical history.  A  physical exam.  Tests, including: ? A test to check the electrical signals in your heart (electrocardiogram). ? Exercise stress test. This looks for signs of blockage when the heart is stressed with exercise, such as running on a treadmill. ? Pharmacologic stress test. This test looks for signs of blockage when the heart is being stressed with a medicine. ? Blood tests. ? Coronary angiogram. This is a procedure to look at the coronary arteries to see if there is any blockage. During this test, a dye is injected into your arteries so they appear on an X-ray. ? A test that uses sound waves to take a picture of your heart (echocardiogram). ? Chest X-ray.  How is this treated? This condition may be treated by:  Healthy lifestyle changes to reduce risk factors.  Medicines such as: ? Antiplatelet medicines and blood-thinning medicines, such as aspirin. These help prevent blood clots. ? Nitroglycerin. ? Blood pressure medicines. ? Cholesterol-lowering medicine.  Coronary angioplasty and stenting. During this procedure, a thin, flexible tube is inserted through a blood vessel and into a blocked artery. A balloon or similar device on the end of the tube is inflated to open up the artery. In some cases, a small, mesh tube (stent) is inserted into the artery to keep it open.  Coronary artery bypass surgery. During this surgery, veins or arteries from other parts of the body are used to create a bypass around the blockage and allow blood to reach your heart.  Follow these instructions at home: Medicines  Take over-the-counter and prescription medicines only as told by your health care provider.  Do not take the following medicines unless your health care provider approves: ? NSAIDs, such as ibuprofen, naproxen, or celecoxib. ? Vitamin supplements that contain vitamin A, vitamin E, or both. ? Hormone replacement therapy that contains estrogen with or without progestin. Lifestyle  Follow an  exercise program approved by your health care provider. Aim for 150 minutes of moderate exercise or 75 minutes of vigorous exercise each week.  Maintain a healthy weight or lose weight as approved by your health care provider.  Rest when you are tired.  Learn to manage stress or try to limit your stress. Ask your health care provider for suggestions if you need help.  Get screened for depression and seek treatment, if needed.  Do not use any products that contain nicotine or tobacco, such as cigarettes and e-cigarettes. If you need help quitting, ask your health care provider.  Do not use illegal drugs. Eating and drinking  Follow a heart-healthy diet. A dietitian can help educate you about healthy food options and changes. In general, eat plenty of fruits and vegetables, lean meats, and whole grains.  Avoid foods high in: ? Sugar. ? Salt (sodium). ? Saturated fats, such as processed or fatty meat. ? Trans fats, such as fried food.  Use healthy cooking methods such as roasting, grilling, broiling, baking, poaching, steaming, or stir-frying.  If you drink alcohol, and your health care provider approves, limit your alcohol intake to no more than 1 drink per day. One drink equals 12 ounces of beer,  5 ounces of wine, or 1 ounces of hard liquor. General instructions  Manage any other health conditions, such as hypertension and diabetes. These conditions affect your heart.  Your health care provider may ask you to monitor your blood pressure. Ideally, your blood pressure should be below 130/80.  Keep all follow-up visits as told by your health care provider. This is important. Get help right away if:  You have pain in your chest, neck, arm, jaw, stomach, or back that: ? Lasts more than a few minutes. ? Is recurring. ? Is not relieved by taking medicine under your tongue (sublingualnitroglycerin).  You have profuse sweating without cause.  You have unexplained: ? Heartburn or  indigestion. ? Shortness of breath or difficulty breathing. ? Fluttering or fast heartbeat (palpitations). ? Nausea or vomiting. ? Fatigue. ? Feelings of nervousness or anxiety. ? Weakness. ? Diarrhea.  You have sudden light-headedness or dizziness.  You faint.  You feel like hurting yourself or think about taking your own life. These symptoms may represent a serious problem that is an emergency. Do not wait to see if the symptoms will go away. Get medical help right away. Call your local emergency services (911 in the [place] Do not drive yourself to the hospital. Summary  Coronary artery disease (CAD) is a process in which the arteries that lead to the heart (coronary arteries) become narrow or blocked. The narrowing or blockage can lead to a heart attack.  Many women have chest discomfort and other common symptoms of CAD. However, women often have different (atypical) symptoms, such as fatigue, vomiting, and dizziness or weakness.  CAD can be treated with lifestyle changes, medicines, surgery, or a combination of these treatments. This information is not intended to replace advice given to you by your health care provider. Make sure you discuss any questions you have with your health care provider. Document Released: 08/02/2011 Document Revised: 04/30/2016 Document Reviewed: 04/30/2016 Elsevier Interactive Patient Education  [employer].

## 2017-12-02 ENCOUNTER — Other Ambulatory Visit: Payer: Medicaid Other

## 2017-12-02 DIAGNOSIS — E782 Mixed hyperlipidemia: Secondary | ICD-10-CM

## 2017-12-02 LAB — LIPID PANEL
Chol/HDL Ratio: 5.2 ratio — ABNORMAL HIGH (ref 0.0–4.4)
Cholesterol, Total: 213 mg/dL — ABNORMAL HIGH (ref 100–199)
HDL: 41 mg/dL (ref >39)
LDL Calculated: 126 mg/dL — ABNORMAL HIGH (ref 0–99)
Triglycerides: 231 mg/dL — ABNORMAL HIGH (ref 0–149)
VLDL Cholesterol Cal: 46 mg/dL — ABNORMAL HIGH (ref 5–40)

## 2017-12-06 ENCOUNTER — Other Ambulatory Visit: Payer: Self-pay

## 2017-12-06 DIAGNOSIS — E785 Hyperlipidemia, unspecified: Secondary | ICD-10-CM

## 2017-12-06 MED ORDER — ATORVASTATIN CALCIUM 80 MG PO TABS: 80.0000 mg | ORAL_TABLET | Freq: Every day | ORAL | 3 refills | Status: DC

## 2017-12-09 ENCOUNTER — Telehealth (HOSPITAL_COMMUNITY): Payer: Self-pay

## 2017-12-09 NOTE — Telephone Encounter (Signed)
Attempted to contact patient in regards to Cardiac Rehab - lm on vm °

## 2017-12-16 ENCOUNTER — Telehealth (HOSPITAL_COMMUNITY): Payer: Self-pay

## 2017-12-16 NOTE — Telephone Encounter (Signed)
Called and spoke with patient in regards to Cardiac Rehab - Patient stated she will not be able to participate. Did not give reason. Closed referral.

## 2017-12-21 ENCOUNTER — Other Ambulatory Visit: Payer: Self-pay

## 2017-12-21 ENCOUNTER — Other Ambulatory Visit: Payer: Self-pay | Admitting: Family Medicine

## 2017-12-21 DIAGNOSIS — R110 Nausea: Secondary | ICD-10-CM

## 2017-12-21 DIAGNOSIS — F32A Depression, unspecified: Secondary | ICD-10-CM

## 2017-12-21 DIAGNOSIS — J328 Other chronic sinusitis: Secondary | ICD-10-CM

## 2017-12-21 DIAGNOSIS — F329 Major depressive disorder, single episode, unspecified: Secondary | ICD-10-CM

## 2017-12-21 MED ORDER — BUPROPION HCL ER (SR) 150 MG PO TB12: 150.0000 mg | ORAL_TABLET | Freq: Two times a day (BID) | ORAL | 0 refills | Status: DC

## 2017-12-21 MED FILL — METOCLOPRAMIDE 10 MG TABLET: 10 | 13 days supply | Qty: 40 | Fill #0

## 2017-12-21 MED FILL — PANTOPRAZOLE SOD DR 40 MG T: 40 | 30 days supply | Qty: 30 | Fill #0

## 2017-12-21 MED FILL — ACCU-CHEK AVIVA PLUS TEST S: 30 days supply | Qty: 100 | Fill #3

## 2017-12-21 MED FILL — METHOCARBAMOL 500 MG TABS: 500 | 30 days supply | Qty: 90 | Fill #0

## 2017-12-21 MED FILL — CLOPIDOGREL 75 MG TABLET: 75 | 30 days supply | Qty: 30 | Fill #0

## 2017-12-21 MED FILL — GABAPENTIN 300 MG CAPSULE: 300 | 30 days supply | Qty: 60 | Fill #0

## 2017-12-21 MED FILL — BUPROPION SR 150 MG TABLET: 150 | 30 days supply | Qty: 60 | Fill #0

## 2017-12-21 MED FILL — LISINOPRIL 20 MG TAB: 20 | 30 days supply | Qty: 30 | Fill #1

## 2017-12-21 MED FILL — ACCU-CHEK SOFTCLIX LANCETS: 30 days supply | Qty: 100 | Fill #3

## 2017-12-21 MED FILL — CETIRIZINE HCL 10 MG TABLET: 10 | 30 days supply | Qty: 30 | Fill #0

## 2017-12-26 ENCOUNTER — Telehealth: Payer: Self-pay | Admitting: Family Medicine

## 2017-12-26 MED FILL — LANTUS SOLOSTAR 100 UNITS/M: 100 | 32 days supply | Qty: 15 | Fill #1

## 2017-12-26 NOTE — Telephone Encounter (Signed)
Patient is calling for Lantuss pens

## 2017-12-26 NOTE — Telephone Encounter (Signed)
[facility], submitted refill, it should be ready later on today

## 2018-01-10 ENCOUNTER — Ambulatory Visit: Payer: Medicaid Other | Admitting: Family Medicine

## 2018-01-17 ENCOUNTER — Other Ambulatory Visit: Payer: Medicaid Other

## 2018-01-24 ENCOUNTER — Other Ambulatory Visit: Payer: Self-pay | Admitting: Family Medicine

## 2018-01-24 DIAGNOSIS — J328 Other chronic sinusitis: Secondary | ICD-10-CM

## 2018-01-24 DIAGNOSIS — R110 Nausea: Secondary | ICD-10-CM

## 2018-01-24 DIAGNOSIS — F329 Major depressive disorder, single episode, unspecified: Secondary | ICD-10-CM

## 2018-01-24 DIAGNOSIS — F32A Depression, unspecified: Secondary | ICD-10-CM

## 2018-01-24 MED FILL — TRUEPLUS PEN NDL 31GX3/16": 31G X 5 MM | 30 days supply | Qty: 100 | Fill #3

## 2018-01-24 MED FILL — PANTOPRAZOLE SOD DR 40 MG T: 40 | 30 days supply | Qty: 30 | Fill #1

## 2018-01-24 MED FILL — CLOPIDOGREL 75 MG TABLET: 75 | 30 days supply | Qty: 30 | Fill #1

## 2018-01-24 MED FILL — LANTUS SOLOSTAR 100 UNITS/M: 100 | 32 days supply | Qty: 15 | Fill #2

## 2018-01-24 MED FILL — ACCU-CHEK SOFTCLIX LANCETS: 30 days supply | Qty: 100 | Fill #4

## 2018-01-24 MED FILL — LISINOPRIL 20 MG TAB: 20 | 30 days supply | Qty: 30 | Fill #2

## 2018-01-24 MED FILL — TRUEPLUS PEN NDL 31GX3/16: 31G X 5 MM | 30 days supply | Qty: 100 | Fill #3

## 2018-01-24 MED FILL — GABAPENTIN 300 MG CAPSULE: 300 | 30 days supply | Qty: 60 | Fill #1

## 2018-01-24 MED FILL — ACCU-CHEK AVIVA PLUS TEST S: 30 days supply | Qty: 100 | Fill #4

## 2018-02-15 ENCOUNTER — Encounter: Payer: Self-pay | Admitting: Pharmacist

## 2018-02-15 ENCOUNTER — Ambulatory Visit: Payer: Medicaid Other | Attending: Family Medicine | Admitting: Pharmacist

## 2018-02-15 DIAGNOSIS — Z23 Encounter for immunization: Secondary | ICD-10-CM

## 2018-02-15 NOTE — Progress Notes (Signed)
Patient presents to clinic for influenza vaccine administration. Has completed pre-visit questionnaire which included documented consent. No contraindications exist. Vaccine administered. Patient with no reactions to vaccine upon discharge.    Results and documentation to be scanned in to [facility].    Luke Van Ausdall, PharmD, CPP Clinical Pharmacist [facility] [phone] 

## 2018-02-20 ENCOUNTER — Ambulatory Visit: Payer: Medicaid Other | Admitting: Family Medicine

## 2018-02-27 ENCOUNTER — Other Ambulatory Visit: Payer: Self-pay | Admitting: Family Medicine

## 2018-02-27 DIAGNOSIS — Z794 Long term (current) use of insulin: Secondary | ICD-10-CM

## 2018-02-27 DIAGNOSIS — I10 Essential (primary) hypertension: Secondary | ICD-10-CM

## 2018-02-27 DIAGNOSIS — E1165 Type 2 diabetes mellitus with hyperglycemia: Secondary | ICD-10-CM

## 2018-02-27 DIAGNOSIS — E118 Type 2 diabetes mellitus with unspecified complications: Secondary | ICD-10-CM

## 2018-02-27 DIAGNOSIS — IMO0002 Reserved for concepts with insufficient information to code with codable children: Secondary | ICD-10-CM

## 2018-02-27 MED FILL — LISINOPRIL 20 MG TAB: 20 | 30 days supply | Qty: 30 | Fill #0

## 2018-02-27 MED FILL — PANTOPRAZOLE SOD DR 40 MG T: 40 | 30 days supply | Qty: 30 | Fill #2

## 2018-02-27 MED FILL — CETIRIZINE HCL 10 MG TABLET: 10 | 30 days supply | Qty: 30 | Fill #0

## 2018-02-27 MED FILL — BUPROPION SR 150 MG TABLET: 150 | 30 days supply | Qty: 60 | Fill #0

## 2018-02-27 MED FILL — CLOPIDOGREL 75 MG TABLET: 75 | 30 days supply | Qty: 30 | Fill #2

## 2018-02-27 MED FILL — METOCLOPRAMIDE 10 MG TABLET: 10 | 13 days supply | Qty: 40 | Fill #0

## 2018-02-27 MED FILL — GABAPENTIN 300 MG CAPSULE: 300 | 30 days supply | Qty: 60 | Fill #2

## 2018-03-06 MED FILL — ACCU-CHEK AVIVA PLUS TEST S: 30 days supply | Qty: 100 | Fill #5

## 2018-03-06 MED FILL — ACCU-CHEK SOFTCLIX LANCETS: 30 days supply | Qty: 100 | Fill #5

## 2018-03-15 ENCOUNTER — Ambulatory Visit: Payer: Medicaid Other | Attending: Family Medicine | Admitting: Family Medicine

## 2018-03-15 ENCOUNTER — Encounter: Payer: Self-pay | Admitting: Family Medicine

## 2018-03-15 VITALS — BP 166/80 | HR 69 | Temp 97.6°F | Ht 62.0 in | Wt 188.0 lb

## 2018-03-15 DIAGNOSIS — Z79899 Other long term (current) drug therapy: Secondary | ICD-10-CM | POA: Insufficient documentation

## 2018-03-15 DIAGNOSIS — Z1239 Encounter for other screening for malignant neoplasm of breast: Secondary | ICD-10-CM

## 2018-03-15 DIAGNOSIS — F329 Major depressive disorder, single episode, unspecified: Secondary | ICD-10-CM | POA: Diagnosis not present

## 2018-03-15 DIAGNOSIS — I10 Essential (primary) hypertension: Secondary | ICD-10-CM | POA: Diagnosis not present

## 2018-03-15 DIAGNOSIS — K219 Gastro-esophageal reflux disease without esophagitis: Secondary | ICD-10-CM

## 2018-03-15 DIAGNOSIS — M1990 Unspecified osteoarthritis, unspecified site: Secondary | ICD-10-CM | POA: Insufficient documentation

## 2018-03-15 DIAGNOSIS — G4730 Sleep apnea, unspecified: Secondary | ICD-10-CM | POA: Insufficient documentation

## 2018-03-15 DIAGNOSIS — Z9111 Patient's noncompliance with dietary regimen: Secondary | ICD-10-CM | POA: Insufficient documentation

## 2018-03-15 DIAGNOSIS — Z794 Long term (current) use of insulin: Secondary | ICD-10-CM | POA: Diagnosis not present

## 2018-03-15 DIAGNOSIS — Z888 Allergy status to other drugs, medicaments and biological substances status: Secondary | ICD-10-CM | POA: Diagnosis not present

## 2018-03-15 DIAGNOSIS — I2510 Atherosclerotic heart disease of native coronary artery without angina pectoris: Secondary | ICD-10-CM | POA: Diagnosis not present

## 2018-03-15 DIAGNOSIS — E669 Obesity, unspecified: Secondary | ICD-10-CM | POA: Diagnosis not present

## 2018-03-15 DIAGNOSIS — Z955 Presence of coronary angioplasty implant and graft: Secondary | ICD-10-CM | POA: Insufficient documentation

## 2018-03-15 DIAGNOSIS — R197 Diarrhea, unspecified: Secondary | ICD-10-CM

## 2018-03-15 DIAGNOSIS — E1165 Type 2 diabetes mellitus with hyperglycemia: Secondary | ICD-10-CM | POA: Diagnosis not present

## 2018-03-15 DIAGNOSIS — E785 Hyperlipidemia, unspecified: Secondary | ICD-10-CM | POA: Insufficient documentation

## 2018-03-15 DIAGNOSIS — E118 Type 2 diabetes mellitus with unspecified complications: Secondary | ICD-10-CM

## 2018-03-15 DIAGNOSIS — IMO0002 Reserved for concepts with insufficient information to code with codable children: Secondary | ICD-10-CM

## 2018-03-15 LAB — POCT GLYCOSYLATED HEMOGLOBIN (HGB A1C): Hemoglobin A1C: 10 % — AB (ref 4.0–5.6)

## 2018-03-15 LAB — GLUCOSE, POCT (MANUAL RESULT ENTRY): POC Glucose: 284 mg/dl — AB (ref 70–99)

## 2018-03-15 MED ORDER — SITAGLIPTIN PHOS-METFORMIN HCL 50-1000 MG PO TABS: 1.0000 | ORAL_TABLET | Freq: Two times a day (BID) | ORAL | 1 refills | Status: DC

## 2018-03-15 MED ORDER — PANTOPRAZOLE SODIUM 40 MG PO TBEC: 40.0000 mg | DELAYED_RELEASE_TABLET | Freq: Every day | ORAL | 3 refills | Status: DC

## 2018-03-15 MED ORDER — LISINOPRIL 20 MG PO TABS: 20.0000 mg | ORAL_TABLET | Freq: Every day | ORAL | 6 refills | Status: DC

## 2018-03-15 MED ORDER — INSULIN GLARGINE 100 UNIT/ML SOLOSTAR PEN: 45.0000 [IU] | PEN_INJECTOR | Freq: Every day | SUBCUTANEOUS | 2 refills | Status: DC

## 2018-03-15 MED ORDER — DICYCLOMINE HCL 10 MG PO CAPS: 10.0000 mg | ORAL_CAPSULE | Freq: Every day | ORAL | 3 refills | Status: DC

## 2018-03-15 NOTE — Progress Notes (Signed)
Subjective:  Patient ID: Sarah Charles, female    DOB: 1961-03-13  Age: 57 y.o. MRN: [id number]  CC: Diabetes   HPI Sarah Charles  is a 57 year old female with a history of type 2 diabetes mellitus (A1c 10.8), hypertension, hyperlipidemia, GERD who presents today for follow-up of her diabetes mellitus. A1c is 10.0 which is up from 8.6 previously and she has been compliant with her  Lantus but not a diabetic diet as she eats what she can afford. She also does not exercise. Her blood pressure is elevated as well but was 142/83 at her last visit. Tolerates her statin and denies reflux symptoms. She complains of loose bowel movements occurring once to twice/day whenever she eats but denies abdominal cramping, nausea, vomiting, fever.  Past Medical History:  Diagnosis Date  . Arthritis   . Coronary artery calcification seen on CT scan    a. 07/2017 noted on CT abd.  . Depression   . GERD (gastroesophageal reflux disease)   . Hyperlipidemia   . Hypertension   . Obesity   . Sleep apnea    a. Does not use CPAP "cause i dont think I need it anymore".  . Tobacco abuse   . Type II diabetes mellitus ([facility])     Past Surgical History:  Procedure Laterality Date  . CHOLECYSTECTOMY    . CORONARY STENT INTERVENTION N/A 11/04/2017   Procedure: CORONARY STENT INTERVENTION;  Surgeon: Martinique, Peter M, MD;  Location: [facility];  Service: Cardiovascular;  Laterality: N/A;  . INCISION AND DRAINAGE ABSCESS Left 12/22/2012   Procedure: INCISION AND DRAINAGE ABSCESS;  Surgeon: Jamesetta So, MD;  Location: [facility];  Service: General;  Laterality: Left;  . INTRAVASCULAR PRESSURE WIRE/FFR STUDY N/A 11/04/2017   Procedure: INTRAVASCULAR PRESSURE WIRE/FFR STUDY;  Surgeon: Martinique, Peter M, MD;  Location: [facility];  Service: Cardiovascular;  Laterality: N/A;  . LEFT HEART CATH AND CORONARY ANGIOGRAPHY N/A 11/04/2017   Procedure: LEFT HEART CATH AND CORONARY ANGIOGRAPHY;  Surgeon: Martinique, Peter M, MD;   Location: [facility];  Service: Cardiovascular;  Laterality: N/A;  . TONSILLECTOMY    . TUBAL LIGATION      Allergies  Allergen Reactions  . Cymbalta [Duloxetine Hcl] Nausea Only    Nausea, lack of therapeutic effect     Outpatient Medications Prior to Visit  Medication Sig Dispense Refill  . atorvastatin (LIPITOR) 80 MG tablet Take 1 tablet (80 mg total) by mouth daily. 90 tablet 3  . Blood Glucose Monitoring Suppl (ACCU-CHEK AVIVA PLUS) w/Device KIT 1 each by Does not apply route 3 (three) times daily. 1 kit 0  . buPROPion (WELLBUTRIN SR) 150 MG 12 hr tablet TAKE 1 TABLET BY MOUTH 2 TIMES DAILY. 60 tablet 0  . cetirizine (ZYRTEC) 10 MG tablet TAKE 1 TABLET BY MOUTH DAILY. 30 tablet 0  . clopidogrel (PLAVIX) 75 MG tablet Take 1 tablet (75 mg total) by mouth daily. 90 tablet 2  . gabapentin (NEURONTIN) 300 MG capsule Take 1 capsule (300 mg total) by mouth 2 (two) times daily. 60 capsule 2  . glucose blood (ACCU-CHEK AVIVA) test strip Use as instructed 100 each 12  . Insulin Pen Needle (PEN NEEDLES) 31G X 5 MM MISC 35 Units by Does not apply route daily. 100 each 11  . Lancet Devices (ACCU-CHEK SOFTCLIX) lancets Use as instructed 1 each 12  . methocarbamol (ROBAXIN) 500 MG tablet TAKE 1 TABLET (500 MG TOTAL) BY MOUTH 3 (THREE) TIMES  DAILY. 90 tablet 0  . metoCLOPramide (REGLAN) 10 MG tablet TAKE 1 TABLET BY MOUTH EVERY EIGHT HOURS AS NEEDED FOR NAUSEA OR VOMITING. 40 tablet 0  . omega-3 acid ethyl esters (LOVAZA) 1 g capsule Take 1 capsule (1 g total) by mouth 2 (two) times daily. 180 capsule 0  . traZODone (DESYREL) 50 MG tablet Take 1 tablet (50 mg total) by mouth at bedtime as needed for sleep. 30 tablet 3  . TRUEPLUS LANCETS 28G MISC Use as directed 100 each 12  . vitamin C (ASCORBIC ACID) 500 MG tablet Take 500 mg by mouth daily.    . Insulin Glargine (LANTUS SOLOSTAR) 100 UNIT/ML Solostar Pen Inject 35 Units into the skin at bedtime. 15 mL 2  . lisinopril (PRINIVIL,ZESTRIL)  20 MG tablet Take 1 tablet (20 mg total) by mouth daily. MUST MAKE APPT FOR FURTHER REFILLS 30 tablet 0  . pantoprazole (PROTONIX) 40 MG tablet Take 1 tablet (40 mg total) by mouth daily. 90 tablet 3  . sitaGLIPtin-metformin (JANUMET) 50-1000 MG tablet Take 1 tablet by mouth 2 (two) times daily with a meal. Resume in 2 days. 180 tablet 0  . amLODipine (NORVASC) 5 MG tablet Take 1 tablet (5 mg total) by mouth daily. 30 tablet 12  . metoprolol tartrate (LOPRESSOR) 25 MG tablet Take 1 tablet (25 mg total) by mouth 2 (two) times daily. 60 tablet 12  . nitroGLYCERIN (NITROSTAT) 0.4 MG SL tablet Place 1 tablet (0.4 mg total) under the tongue every 5 (five) minutes as needed for chest pain. 25 tablet 3   No facility-administered medications prior to visit.     ROS Review of Systems  Constitutional: Negative for activity change, appetite change and fatigue.  HENT: Negative for congestion, sinus pressure and sore throat.   Eyes: Negative for visual disturbance.  Respiratory: Negative for cough, chest tightness, shortness of breath and wheezing.   Cardiovascular: Negative for chest pain and palpitations.  Gastrointestinal: Positive for diarrhea. Negative for abdominal distention, abdominal pain and constipation.  Endocrine: Negative for polydipsia.  Genitourinary: Negative for dysuria and frequency.  Musculoskeletal: Negative for arthralgias and back pain.  Skin: Negative for rash.  Neurological: Negative for tremors, light-headedness and numbness.  Hematological: Does not bruise/bleed easily.  Psychiatric/Behavioral: Negative for agitation and behavioral problems.    Objective:  BP (!) 166/80   Pulse 69   Temp 97.6 F (36.4 C) (Oral)   Ht '5\' 2"'  (1.575 m)   Wt 188 lb (85.3 kg)   SpO2 98%   BMI 34.39 kg/m   BP/Weight 03/15/2018 11/22/2017 11/16/6387  Systolic BP 373 428 768  Diastolic BP 80 74 83  Wt. (Lbs) 188 183.12 178  BMI 34.39 33.49 32.56      Physical Exam  Constitutional:  She is oriented to person, place, and time. She appears well-developed and well-nourished.  HENT:  Right Ear: External ear normal.  Left Ear: External ear normal.  Mouth/Throat: Oropharynx is clear and moist.  Cardiovascular: Normal rate, normal heart sounds and intact distal pulses.  No murmur heard. Pulmonary/Chest: Effort normal and breath sounds normal. She has no wheezes. She has no rales. She exhibits no tenderness.  Abdominal: Soft. Bowel sounds are normal. She exhibits no distension and no mass. There is no tenderness.  Musculoskeletal: Normal range of motion.  Neurological: She is alert and oriented to person, place, and time.  Skin: Skin is warm and dry.  Psychiatric: She has a normal mood and affect.  CMP Latest Ref Rng & Units 11/02/2017 10/29/2017 07/26/2017  Glucose 65 - 99 mg/dL 230(H) 345(H) 303(H)  BUN 6 - 24 mg/dL '19 13 15  ' Creatinine 0.57 - 1.00 mg/dL 0.46(L) 0.45 0.53  Sodium 134 - 144 mmol/L 137 138 134(L)  Potassium 3.5 - 5.2 mmol/L 4.1 4.1 3.4(L)  Chloride 96 - 106 mmol/L 97 101 98(L)  CO2 20 - 29 mmol/L '24 27 24  ' Calcium 8.7 - 10.2 mg/dL 10.0 9.2 9.2  Total Protein 6.0 - 8.5 g/dL 6.9 - 6.7  Total Bilirubin 0.0 - 1.2 mg/dL 0.2 - 0.4  Alkaline Phos 39 - 117 IU/L 147(H) - 122  AST 0 - 40 IU/L 12 - 16  ALT 0 - 32 IU/L 15 - 10(L)    Lipid Panel     Component Value Date/Time   CHOL 213 (H) 12/02/2017 0000   TRIG 231 (H) 12/02/2017 0000   HDL 41 12/02/2017 0000   CHOLHDL 5.2 (H) 12/02/2017 0000   CHOLHDL 9.7 (H) 07/23/2016 1638   VLDL NOT CALC 07/23/2016 1638   LDLCALC 126 (H) 12/02/2017 0000    Lab Results  Component Value Date   HGBA1C 10.0 (A) 03/15/2018    Assessment & Plan:   1. Uncontrolled type 2 diabetes mellitus with complication, with long-term current use of insulin (HCC) Uncontrolled with A1c of 10.0 due to noncompliance with diet Increased dose of Lantus Diabetic diet, lifestyle modifications - POCT glucose (manual entry) - POCT  glycosylated hemoglobin (Hb A1C) - Insulin Glargine (LANTUS SOLOSTAR) 100 UNIT/ML Solostar Pen; Inject 45 Units into the skin at bedtime.  Dispense: 15 mL; Refill: 2 - lisinopril (PRINIVIL,ZESTRIL) 20 MG tablet; Take 1 tablet (20 mg total) by mouth daily.  Dispense: 30 tablet; Refill: 6 - sitaGLIPtin-metformin (JANUMET) 50-1000 MG tablet; Take 1 tablet by mouth 2 (two) times daily with a meal.  Dispense: 180 tablet; Refill: 1  2. Essential hypertension Uncontrolled Counseled on blood pressure goal of less than 130/80, low-sodium, DASH diet, medication compliance, 150 minutes of moderate intensity exercise per week. Discussed medication compliance, adverse effects. Regimen changes at next visit if still elevated - lisinopril (PRINIVIL,ZESTRIL) 20 MG tablet; Take 1 tablet (20 mg total) by mouth daily.  Dispense: 30 tablet; Refill: 6  3. Diarrhea, unspecified type Trial of Bentyl Consider stool studies to exclude pancreatic insufficiency if persisting - dicyclomine (BENTYL) 10 MG capsule; Take 1 capsule (10 mg total) by mouth daily.  Dispense: 30 capsule; Refill: 3  4. Screening for breast cancer - MM Digital Screening; Future  5. Gastroesophageal reflux disease without esophagitis Controlled - pantoprazole (PROTONIX) 40 MG tablet; Take 1 tablet (40 mg total) by mouth daily.  Dispense: 90 tablet; Refill: 3   Meds ordered this encounter  Medications  . dicyclomine (BENTYL) 10 MG capsule    Sig: Take 1 capsule (10 mg total) by mouth daily.    Dispense:  30 capsule    Refill:  3  . Insulin Glargine (LANTUS SOLOSTAR) 100 UNIT/ML Solostar Pen    Sig: Inject 45 Units into the skin at bedtime.    Dispense:  15 mL    Refill:  2    Discontinue previous dose  . lisinopril (PRINIVIL,ZESTRIL) 20 MG tablet    Sig: Take 1 tablet (20 mg total) by mouth daily.    Dispense:  30 tablet    Refill:  6  . pantoprazole (PROTONIX) 40 MG tablet    Sig: Take 1 tablet (40 mg total) by mouth daily.  Dispense:  90 tablet    Refill:  3  . sitaGLIPtin-metformin (JANUMET) 50-1000 MG tablet    Sig: Take 1 tablet by mouth 2 (two) times daily with a meal.    Dispense:  180 tablet    Refill:  1    Follow-up: Return in about 3 months (around 06/15/2018) for Follow-up of chronic medical conditions.   Charlott Rakes MD

## 2018-03-15 NOTE — Patient Instructions (Signed)
Diabetes Mellitus and Nutrition When you have diabetes (diabetes mellitus), it is very important to have healthy eating habits because your blood sugar (glucose) levels are greatly affected by what you eat and drink. Eating healthy foods in the appropriate amounts, at about the same times every day, can help you:  Control your blood glucose.  Lower your risk of heart disease.  Improve your blood pressure.  Reach or maintain a healthy weight.  Every person with diabetes is different, and each person has different needs for a meal plan. Your health care provider may recommend that you work with a diet and nutrition specialist (dietitian) to make a meal plan that is best for you. Your meal plan may vary depending on factors such as:  The calories you need.  The medicines you take.  Your weight.  Your blood glucose, blood pressure, and cholesterol levels.  Your activity level.  Other health conditions you have, such as heart or kidney disease.  How do carbohydrates affect me? Carbohydrates affect your blood glucose level more than any other type of food. Eating carbohydrates naturally increases the amount of glucose in your blood. Carbohydrate counting is a method for keeping track of how many carbohydrates you eat. Counting carbohydrates is important to keep your blood glucose at a healthy level, especially if you use insulin or take certain oral diabetes medicines. It is important to know how many carbohydrates you can safely have in each meal. This is different for every person. Your dietitian can help you calculate how many carbohydrates you should have at each meal and for snack. Foods that contain carbohydrates include:  Bread, cereal, rice, pasta, and crackers.  Potatoes and corn.  Peas, beans, and lentils.  Milk and yogurt.  Fruit and juice.  Desserts, such as cakes, cookies, ice cream, and candy.  How does alcohol affect me? Alcohol can cause a sudden decrease in blood  glucose (hypoglycemia), especially if you use insulin or take certain oral diabetes medicines. Hypoglycemia can be a life-threatening condition. Symptoms of hypoglycemia (sleepiness, dizziness, and confusion) are similar to symptoms of having too much alcohol. If your health care provider says that alcohol is safe for you, follow these guidelines:  Limit alcohol intake to no more than 1 drink per day for nonpregnant women and 2 drinks per day for men. One drink equals 12 oz of beer, 5 oz of wine, or 1 oz of hard liquor.  Do not drink on an empty stomach.  Keep yourself hydrated with water, diet soda, or unsweetened iced tea.  Keep in mind that regular soda, juice, and other mixers may contain a lot of sugar and must be counted as carbohydrates.  What are tips for following this plan? Reading food labels  Start by checking the serving size on the label. The amount of calories, carbohydrates, fats, and other nutrients listed on the label are based on one serving of the food. Many foods contain more than one serving per package.  Check the total grams (g) of carbohydrates in one serving. You can calculate the number of servings of carbohydrates in one serving by dividing the total carbohydrates by 15. For example, if a food has 30 g of total carbohydrates, it would be equal to 2 servings of carbohydrates.  Check the number of grams (g) of saturated and trans fats in one serving. Choose foods that have low or no amount of these fats.  Check the number of milligrams (mg) of sodium in one serving. Most people   should limit total sodium intake to less than 2,300 mg per day.  Always check the nutrition information of foods labeled as "low-fat" or "nonfat". These foods may be higher in added sugar or refined carbohydrates and should be avoided.  Talk to your dietitian to identify your daily goals for nutrients listed on the label. Shopping  Avoid buying canned, premade, or processed foods. These  foods tend to be high in fat, sodium, and added sugar.  Shop around the outside edge of the grocery store. This includes fresh fruits and vegetables, bulk grains, fresh meats, and fresh dairy. Cooking  Use low-heat cooking methods, such as baking, instead of high-heat cooking methods like deep frying.  Cook using healthy oils, such as olive, canola, or sunflower oil.  Avoid cooking with butter, cream, or high-fat meats. Meal planning  Eat meals and snacks regularly, preferably at the same times every day. Avoid going long periods of time without eating.  Eat foods high in fiber, such as fresh fruits, vegetables, beans, and whole grains. Talk to your dietitian about how many servings of carbohydrates you can eat at each meal.  Eat 4-6 ounces of lean protein each day, such as lean meat, chicken, fish, eggs, or tofu. 1 ounce is equal to 1 ounce of meat, chicken, or fish, 1 egg, or 1/4 cup of tofu.  Eat some foods each day that contain healthy fats, such as avocado, nuts, seeds, and fish. Lifestyle   Check your blood glucose regularly.  Exercise at least 30 minutes 5 or more days each week, or as told by your health care provider.  Take medicines as told by your health care provider.  Do not use any products that contain nicotine or tobacco, such as cigarettes and e-cigarettes. If you need help quitting, ask your health care provider.  Work with a counselor or diabetes educator to identify strategies to manage stress and any emotional and social challenges. What are some questions to ask my health care provider?  Do I need to meet with a diabetes educator?  Do I need to meet with a dietitian?  What number can I call if I have questions?  When are the best times to check my blood glucose? Where to find more information:  American Diabetes Association: diabetes.org/food-and-fitness/food  Academy of Nutrition and Dietetics:  www.eatright.org/resources/health/diseases-and-conditions/diabetes  National Institute of Diabetes and Digestive and Kidney Diseases (NIH): www.niddk.nih.gov/health-information/diabetes/overview/diet-eating-physical-activity Summary  A healthy meal plan will help you control your blood glucose and maintain a healthy lifestyle.  Working with a diet and nutrition specialist (dietitian) can help you make a meal plan that is best for you.  Keep in mind that carbohydrates and alcohol have immediate effects on your blood glucose levels. It is important to count carbohydrates and to use alcohol carefully. This information is not intended to replace advice given to you by your health care provider. Make sure you discuss any questions you have with your health care provider. Document Released: 02/04/2005 Document Revised: 06/14/2016 Document Reviewed: 06/14/2016 Elsevier Interactive Patient Education  2018 Elsevier Inc.  

## 2018-03-17 ENCOUNTER — Encounter: Payer: Self-pay | Admitting: Family Medicine

## 2018-03-23 ENCOUNTER — Ambulatory Visit (INDEPENDENT_AMBULATORY_CARE_PROVIDER_SITE_OTHER): Payer: Medicaid Other | Admitting: Cardiology

## 2018-03-23 VITALS — BP 96/66 | HR 72 | Ht 63.0 in | Wt 185.0 lb

## 2018-03-23 DIAGNOSIS — I2510 Atherosclerotic heart disease of native coronary artery without angina pectoris: Secondary | ICD-10-CM

## 2018-03-23 DIAGNOSIS — E785 Hyperlipidemia, unspecified: Secondary | ICD-10-CM

## 2018-03-23 DIAGNOSIS — E1169 Type 2 diabetes mellitus with other specified complication: Secondary | ICD-10-CM | POA: Diagnosis not present

## 2018-03-23 DIAGNOSIS — E782 Mixed hyperlipidemia: Secondary | ICD-10-CM

## 2018-03-23 DIAGNOSIS — I10 Essential (primary) hypertension: Secondary | ICD-10-CM | POA: Diagnosis not present

## 2018-03-23 LAB — LIPID PANEL
Chol/HDL Ratio: 7 ratio — ABNORMAL HIGH (ref 0.0–4.4)
Cholesterol, Total: 265 mg/dL — ABNORMAL HIGH (ref 100–199)
HDL: 38 mg/dL — ABNORMAL LOW (ref >39)
Triglycerides: 573 mg/dL (ref 0–149)

## 2018-03-23 LAB — BASIC METABOLIC PANEL
BUN/Creatinine Ratio: 27 — ABNORMAL HIGH (ref 9–23)
BUN: 16 mg/dL (ref 6–24)
CO2: 22 mmol/L (ref 20–29)
Calcium: 10 mg/dL (ref 8.7–10.2)
Chloride: 97 mmol/L (ref 96–106)
Creatinine, Ser: 0.6 mg/dL (ref 0.57–1.00)
GFR calc Af Amer: 117 mL/min/{1.73_m2} (ref >59)
GFR calc non Af Amer: 102 mL/min/{1.73_m2} (ref >59)
Glucose: 194 mg/dL — ABNORMAL HIGH (ref 65–99)
Potassium: 4.3 mmol/L (ref 3.5–5.2)
Sodium: 138 mmol/L (ref 134–144)

## 2018-03-23 LAB — HEPATIC FUNCTION PANEL
ALT: 12 IU/L (ref 0–32)
AST: 10 IU/L (ref 0–40)
Albumin: 4 g/dL (ref 3.5–5.5)
Alkaline Phosphatase: 118 IU/L — ABNORMAL HIGH (ref 39–117)
Bilirubin Total: <0.2 mg/dL (ref 0.0–1.2)
Bilirubin, Direct: 0.07 mg/dL (ref 0.00–0.40)
Total Protein: 6.6 g/dL (ref 6.0–8.5)

## 2018-03-23 LAB — CBC
Hematocrit: 44.9 % (ref 34.0–46.6)
Hemoglobin: 14.9 g/dL (ref 11.1–15.9)
MCH: 29.6 pg (ref 26.6–33.0)
MCHC: 33.2 g/dL (ref 31.5–35.7)
MCV: 89 fL (ref 79–97)
Platelets: 313 10*3/uL (ref 150–450)
RBC: 5.03 x10E6/uL (ref 3.77–5.28)
RDW: 13.3 % (ref 12.3–15.4)
WBC: 9.6 10*3/uL (ref 3.4–10.8)

## 2018-03-23 LAB — VITAMIN B12: Vitamin B-12: 827 pg/mL (ref 232–1245)

## 2018-03-23 LAB — TSH: TSH: 2.87 u[IU]/mL (ref 0.450–4.500)

## 2018-03-23 MED ORDER — AMLODIPINE BESYLATE 2.5 MG PO TABS: 2.5000 mg | ORAL_TABLET | Freq: Every day | ORAL | 3 refills | Status: DC

## 2018-03-23 NOTE — Patient Instructions (Signed)
Medication Instructions:  1) DECREASE Amlodipine to 2.5mg  once daily  If you need a refill on your cardiac medications before your next appointment, please call your pharmacy.   Lab work: BMET, TSH, CBC, Lipid, Liver and B12 today  If you have labs (blood work) drawn today and your tests are completely normal, you will receive your results only by: Marland Kitchen MyChart Message (if you have MyChart) OR . A paper copy in the mail If you have any lab test that is abnormal or we need to change your treatment, we will call you to review the results.  Testing/Procedures: None  Follow-Up: At [facility], you and your health needs are our priority.  As part of our continuing mission to provide you with exceptional heart care, we have created designated Provider Care Teams.  These Care Teams include your primary Cardiologist (physician) and Advanced Practice Providers (APPs -  Physician Assistants and Nurse Practitioners) who all work together to provide you with the care you need, when you need it. You will need a follow up appointment in 3 months.  Please call our office 2 months in advance to schedule this appointment.  You may see Tobias Alexander, MD or one of the following Advanced Practice Providers on your designated Care Team:   [place]on [place], PA-C Ronie Spies, PA-C . Jacolyn Reedy, PA-C  Any Other Special Instructions Will Be Listed Below (If Applicable).

## 2018-03-23 NOTE — Progress Notes (Signed)
Cardiology Office Note:    Date:  03/23/2018   ID:  Sarah Charles, DOB [birth year], MRN [id number]  PCP:  Charlott Rakes, MD  Cardiologist:  Ena Dawley, MD  Referring MD: Charlott Rakes, MD   Chief complaint: Fatigue  History of Present Illness:    Sarah Charles is a 57 y.o. female with a past medical history significant for chin, diabetes type 2, hyperlipidemia, obesity, tobacco abuse, depression, GERD and CAD S/P DES to prox LAD 10/2017.  [name] had gone to the [facility] ED 10/29/2017 for evaluation of chest pressure with diaphoresis, nausea and shortness of breath.  She has a strong family history significant for CAD with her father dying at age 69 of MI.  She ruled out for MI and was transferred to [facility] where she underwent cardiac CTA that showed significant mid LAD stenosis by FFR.  She was discharged from the hospital and came in later for outpatient cardiac catheterization on 11/04/2017 which revealed 75% stenosis of the proximal LAD that was treated with a drug-eluting stent.  She was started on dual antiplatelet therapy for at least 6 months.  Her blood pressures were elevated during her hospitalization  Her husband had 3 stents 2 months ago and this is what prompted her to have her symptoms evaluated. She also notes that she had had left arm pain. Since her stent she has had some dull chest pain sometimes at night. She has taken NTG once since she was discharged. Her chest pain is mild and vague. This does not feel like her discomfort prior to the stent. No further left arm pain. She has gotten back to her daily activities and feels a lot better than prior to the stent. No chest pain or shortness of breath with exertion.   Smokes ~1/2 PPD.   03/23/2018 -she is coming after 3 months, she denies any chest pain or significant shortness of breath, she walks the entire shopping mall 3 times a week with no significant symptoms but enjoys it.  She just overall feels like she has no  energy and started herself on vitamin B12.  Denies any palpitation dizziness or syncope.  She is tolerating her medications well.   Past Medical History:  Diagnosis Date  . Arthritis   . Coronary artery calcification seen on CT scan    a. 07/2017 noted on CT abd.  . Depression   . GERD (gastroesophageal reflux disease)   . Hyperlipidemia   . Hypertension   . Obesity   . Sleep apnea    a. Does not use CPAP "cause i dont think I need it anymore".  . Tobacco abuse   . Type II diabetes mellitus ([place])     Past Surgical History:  Procedure Laterality Date  . CHOLECYSTECTOMY    . CORONARY STENT INTERVENTION N/A 11/04/2017   Procedure: CORONARY STENT INTERVENTION;  Surgeon: Martinique, Peter M, MD;  Location: [place] [facility];  Service: Cardiovascular;  Laterality: N/A;  . INCISION AND DRAINAGE ABSCESS Left 12/22/2012   Procedure: INCISION AND DRAINAGE ABSCESS;  Surgeon: Jamesetta So, MD;  Location: [facility];  Service: General;  Laterality: Left;  . INTRAVASCULAR PRESSURE WIRE/FFR STUDY N/A 11/04/2017   Procedure: INTRAVASCULAR PRESSURE WIRE/FFR STUDY;  Surgeon: Martinique, Peter M, MD;  Location: [place] [facility];  Service: Cardiovascular;  Laterality: N/A;  . LEFT HEART CATH AND CORONARY ANGIOGRAPHY N/A 11/04/2017   Procedure: LEFT HEART CATH AND CORONARY ANGIOGRAPHY;  Surgeon: Martinique, Peter M, MD;  Location:  [place] [facility];  Service: Cardiovascular;  Laterality: N/A;  . TONSILLECTOMY    . TUBAL LIGATION     Current Medications: Current Meds  Medication Sig  . aspirin EC 81 MG tablet Take 81 mg by mouth daily.  Marland Kitchen atorvastatin (LIPITOR) 80 MG tablet Take 1 tablet (80 mg total) by mouth daily.  . Blood Glucose Monitoring Suppl (ACCU-CHEK AVIVA PLUS) w/Device KIT 1 each by Does not apply route 3 (three) times daily.  Marland Kitchen buPROPion (WELLBUTRIN SR) 150 MG 12 hr tablet TAKE 1 TABLET BY MOUTH 2 TIMES DAILY.  . cetirizine (ZYRTEC) 10 MG tablet TAKE 1 TABLET BY MOUTH DAILY.  Marland Kitchen clopidogrel (PLAVIX)  75 MG tablet Take 1 tablet (75 mg total) by mouth daily.  Marland Kitchen gabapentin (NEURONTIN) 300 MG capsule Take 1 capsule (300 mg total) by mouth 2 (two) times daily.  Marland Kitchen glucose blood (ACCU-CHEK AVIVA) test strip Use as instructed  . Insulin Glargine (LANTUS SOLOSTAR) 100 UNIT/ML Solostar Pen Inject 45 Units into the skin at bedtime.  . Insulin Pen Needle (PEN NEEDLES) 31G X 5 MM MISC 35 Units by Does not apply route daily.  Elmore Guise Devices (ACCU-CHEK SOFTCLIX) lancets Use as instructed  . lisinopril (PRINIVIL,ZESTRIL) 20 MG tablet Take 1 tablet (20 mg total) by mouth daily.  . methocarbamol (ROBAXIN) 500 MG tablet TAKE 1 TABLET (500 MG TOTAL) BY MOUTH 3 (THREE) TIMES DAILY.  Marland Kitchen metoCLOPramide (REGLAN) 10 MG tablet TAKE 1 TABLET BY MOUTH EVERY EIGHT HOURS AS NEEDED FOR NAUSEA OR VOMITING.  . metoprolol tartrate (LOPRESSOR) 25 MG tablet Take 1 tablet (25 mg total) by mouth 2 (two) times daily.  . Multiple Vitamin (MULTIVITAMIN) tablet Take 1 tablet by mouth daily.  Marland Kitchen omega-3 acid ethyl esters (LOVAZA) 1 g capsule Take 1 capsule (1 g total) by mouth 2 (two) times daily.  . pantoprazole (PROTONIX) 40 MG tablet Take 1 tablet (40 mg total) by mouth daily.  . sitaGLIPtin-metformin (JANUMET) 50-1000 MG tablet Take 1 tablet by mouth 2 (two) times daily with a meal.  . traZODone (DESYREL) 50 MG tablet Take 1 tablet (50 mg total) by mouth at bedtime as needed for sleep.  . TRUEPLUS LANCETS 28G MISC Use as directed  . vitamin B-12 (CYANOCOBALAMIN) 500 MCG tablet Take 500 mcg by mouth daily.  . vitamin C (ASCORBIC ACID) 500 MG tablet Take 500 mg by mouth daily.  . [DISCONTINUED] amLODipine (NORVASC) 5 MG tablet Take 1 tablet (5 mg total) by mouth daily.     Allergies:   Cymbalta [duloxetine hcl]   Social History   Socioeconomic History  . Marital status: Married    Spouse name: Not on file  . Number of children: Not on file  . Years of education: Not on file  . Highest education level: Not on file    Occupational History  . Not on file  Social Needs  . Financial resource strain: Not on file  . Food insecurity:    Worry: Not on file    Inability: Not on file  . Transportation needs:    Medical: Not on file    Non-medical: Not on file  Tobacco Use  . Smoking status: Current Every Day Smoker    Packs/day: 1.00    Years: 41.00    Pack years: 41.00    Types: Cigarettes  . Smokeless tobacco: Never Used  Substance and Sexual Activity  . Alcohol use: No  . Drug use: No  . Sexual activity: Never  Birth control/protection: None  Lifestyle  . Physical activity:    Days per week: Not on file    Minutes per session: Not on file  . Stress: Not on file  Relationships  . Social connections:    Talks on phone: Not on file    Gets together: Not on file    Attends religious service: Not on file    Active member of club or organization: Not on file    Attends meetings of clubs or organizations: Not on file    Relationship status: Not on file  Other Topics Concern  . Not on file  Social History Narrative   Lives in [place] with husband.  Unemployed.  Does not routinely exercise.     Family History: The patient's family history includes CAD in her father; COPD in her mother; CVA in her mother; Cancer in her maternal grandfather and maternal uncle; Diabetes in her maternal grandmother and sister; Heart attack in her father and mother; Heart disease in her mother; Hypercholesterolemia in her father; Hypertension in her mother; Kidney Stones in her mother. ROS:   Please see the history of present illness.     All other systems reviewed and are negative.  EKGs/Labs/Other Studies Reviewed:    The following studies were reviewed today:    Left heart cath 11/04/17:  Prox LAD lesion is 70% stenosed.  Prox Cx to Mid Cx lesion is 30% stenosed.  Prox RCA to Mid RCA lesion is 20% stenosed.  Post intervention, there is a 0% residual stenosis.  A drug-eluting stent was  successfully placed using a STENT SIERRA 2.50 X 15 MM.  LV end diastolic pressure is normal.  1. Single vessel obstructive CAD. - 70% mid LAD immediately after the first diagonal. FFR 0.78 2. Normal LVEDP 3. Successful PCI of the mid LAD with DES  Plan: DAPT for at least 6 months. She is a candidate for same day discharge. Will hold metformin for 48 hours. Will need to switch Prilosec to Protonix.   Echo 10/30/17: Study Conclusions  - Left ventricle: The cavity size was normal. Systolic function was normal. The estimated ejection fraction was in the range of 55% to 60%.   EKG:  EKG is not ordered today.    Recent Labs: 10/29/2017: TSH 0.965 11/02/2017: ALT 15; BUN 19; Creatinine, Ser 0.46; Hemoglobin 14.5; Platelets 277; Potassium 4.1; Sodium 137   Recent Lipid Panel    Component Value Date/Time   CHOL 213 (H) 12/02/2017 0000   TRIG 231 (H) 12/02/2017 0000   HDL 41 12/02/2017 0000   CHOLHDL 5.2 (H) 12/02/2017 0000   CHOLHDL 9.7 (H) 07/23/2016 1638   VLDL NOT CALC 07/23/2016 1638   LDLCALC 126 (H) 12/02/2017 0000   Physical Exam:    VS:  BP 96/66   Pulse 72   Ht '5\' 3"'  (1.6 m)   Wt 185 lb (83.9 kg)   BMI 32.77 kg/m     Wt Readings from Last 3 Encounters:  03/23/18 185 lb (83.9 kg)  03/15/18 188 lb (85.3 kg)  11/22/17 183 lb 1.9 oz (83.1 kg)    Physical Exam  Constitutional: She is oriented to person, place, and time. She appears well-developed and well-nourished. No distress.  HENT:  Head: Normocephalic and atraumatic.  Neck: Normal range of motion. Neck supple. No JVD present.  Cardiovascular: Normal rate, regular rhythm, normal heart sounds and intact distal pulses. Exam reveals no gallop and no friction rub.  No murmur heard. Pulmonary/Chest: Effort normal  and breath sounds normal. No respiratory distress. She has no wheezes. She has no rales.  Abdominal: Soft. Bowel sounds are normal.  Musculoskeletal: Normal range of motion. She exhibits no edema.   Neurological: She is alert and oriented to person, place, and time.  Skin: Skin is warm and dry.  Psychiatric: She has a normal mood and affect. Her behavior is normal. Thought content normal.  Vitals reviewed.  Right radial cath site healed.    ASSESSMENT:    1. HYPERTENSION, BENIGN ESSENTIAL   2. Coronary artery disease involving native coronary artery of native heart without angina pectoris   3. Mixed dyslipidemia   4. Type 2 diabetes mellitus with hyperlipidemia (HCC)    PLAN:    In order of problems listed above:  CAD status post DES to mid LAD:  Now on dual antiplatelet therapy for at least 6 months, clopidogrel 75 mg and aspirin 81 mg.  Also on beta-blocker, statin and ACE inhibitor.  She has fatigue but no chest pain, will continue the same management no ischemic work-up right now. I will obtain labs including CBC CMP, lipids, TSH and vitamin B12 level.  Hypertension: Too low, I will decrease amlodipine dose to 2.5 mg daily.   Hyperlipidemia: LDL goal of less than 70.  We will recheck today.  Diabetes type II on insulin: Last hemoglobin A1c was 8.6, has been 10.8 prior to that. Try to get A1c under 7.   Tobacco abuse: Smokes ~1/2 PPD.  Struggling with quitting.  Medication Adjustments/Labs and Tests Ordered: Current medicines are reviewed at length with the patient today.  Concerns regarding medicines are outlined above. Labs and tests ordered and medication changes are outlined in the patient instructions below:  Patient Instructions  Medication Instructions:  1) DECREASE Amlodipine to 2.43m once daily  If you need a refill on your cardiac medications before your next appointment, please call your pharmacy.   Lab work: BMET, TSH, CBC, Lipid, Liver and B12 today  If you have labs (blood work) drawn today and your tests are completely normal, you will receive your results only by: .Marland KitchenMyChart Message (if you have MyChart) OR . A paper copy in the mail If you have  any lab test that is abnormal or we need to change your treatment, we will call you to review the results.  Testing/Procedures: None  Follow-Up: [facility] and [facility] needs are our [facility].  As part of our continuing mission to provide you with exceptional heart care, we have created designated Provider Care Teams.  These Care Teams include your primary Cardiologist (physician) and Advanced Practice Providers (APPs -  Physician Assistants and Nurse Practitioners) who all work together to provide you with the care you need, when you need it. You will need a follow up appointment in 3 months.  Please call our office 2 months in advance to schedule this appointment.  You may see KEna Dawley MD or one of the following Advanced Practice Providers on your designated Care Team:   BNaylor PA-C DMelina Copa PA-C . MErmalinda Barrios PA-C  Any Other Special Instructions Will Be Listed Below (If Applicable).       Signed, KEna Dawley MD  03/23/2018 12:01 PM    [place]

## 2018-03-24 ENCOUNTER — Telehealth: Payer: Self-pay | Admitting: Cardiology

## 2018-03-24 DIAGNOSIS — I2510 Atherosclerotic heart disease of native coronary artery without angina pectoris: Secondary | ICD-10-CM

## 2018-03-24 DIAGNOSIS — E782 Mixed hyperlipidemia: Secondary | ICD-10-CM

## 2018-03-24 MED ORDER — ICOSAPENT ETHYL 1 G PO CAPS: 2.0000 g | ORAL_CAPSULE | Freq: Two times a day (BID) | ORAL | 5 refills | Status: DC

## 2018-03-24 NOTE — Telephone Encounter (Signed)
-----   Message from Lars Masson, MD sent at 03/24/2018  8:03 AM EDT ----- Thank you!! Lajoyce Corners, can you call her?  ----- Message ----- From: Levin Bacon, [facility] Sent: 03/24/2018   7:12 AM EDT To: Lars Masson, MD, Loa Socks, LPN

## 2018-03-24 NOTE — Telephone Encounter (Signed)
Spoke with the pt and informed her that based on her lipid results, Dr Delton See recommends that we stop her lovaza, and start her on Vascepa 2 grams po BID, and recheck her lipids in 3 months. Informed the pt that if the Vascepa is not cost effective, then we will restart her Lovaza and increase her Lovaza to 2 grams bid, but lets try the Vascepa first.  Confirmed the pharmacy of choice with the pt.  Scheduled the pt repeat lipid in 3 months on 06/26/2018.  Pt is aware to come fasting to this lab appt. Pt verbalized understanding and agrees with this plan.

## 2018-03-24 NOTE — Telephone Encounter (Signed)
Notes recorded by Levin Bacon, RPH on 03/24/2018 at 7:05 AM EDT Yes I would recommend stopping Lovaza and start Vascepa 2g BID - if this is cost prohibitive would recommend increasing Lovaza to 2g BID. Recheck lipid panel in 2-3 months. ------  Notes recorded by Lars Masson, MD on 03/23/2018 at 4:58 PM EDT Megan and Nicholaus Bloom, This patient is on atorvastatin 80 and fish oil, would you start Vascepa? Thank you, K

## 2018-03-24 NOTE — Telephone Encounter (Signed)
Follow up: ° ° °Patient returning call back °

## 2018-03-27 ENCOUNTER — Telehealth: Payer: Self-pay

## 2018-03-27 ENCOUNTER — Ambulatory Visit
Admission: RE | Admit: 2018-03-27 | Discharge: 2018-03-27 | Disposition: A | Discharge status: Home / Self Care | Payer: Medicaid Other | Source: Ambulatory Visit | Attending: Family Medicine | Admitting: Family Medicine

## 2018-03-27 DIAGNOSIS — Z1239 Encounter for other screening for malignant neoplasm of breast: Secondary | ICD-10-CM

## 2018-03-27 DIAGNOSIS — Z1231 Encounter for screening mammogram for malignant neoplasm of breast: Secondary | ICD-10-CM | POA: Diagnosis not present

## 2018-03-27 NOTE — Telephone Encounter (Signed)
P/A started for Vascepa..P/A #[id number]

## 2018-04-03 ENCOUNTER — Telehealth: Payer: Self-pay

## 2018-04-03 NOTE — Telephone Encounter (Signed)
-----   Message from [name], MD sent at 03/29/2018  1:40 PM EST ----- Mammogram is negative for malignancy

## 2018-04-03 NOTE — Telephone Encounter (Signed)
Patient was called and informed of lab results. 

## 2018-04-05 NOTE — Telephone Encounter (Signed)
Please start fenofibrate 160 mg po daily instead

## 2018-04-05 NOTE — Telephone Encounter (Signed)
I called [employer] and s/w Demitria concerning this denial . Per Demitria only the pt can initiate an appeal through [employer] and she will have to try and fail Fenofibrate.  Will forward this message to Dr Delton SeeNelson and her nurse for recommendation to the pt.

## 2018-04-05 NOTE — Telephone Encounter (Addendum)
We received a [facility] from [facility] stating that they have denied the pts Vascepa PA.  Reason: Tillman SersVascepa is a non-preferred medication so the pt must first try and fail 2 of the preferred medications from the same class. The preferred medications are: Gemfibrozil and Fenofibrate  Gemfibrozil is contraindicated if the pt is taking a statin and the pt is taking Atorvastatin.   I will call [employer] and attempt to appeal this decision.

## 2018-04-06 MED ORDER — FENOFIBRATE 160 MG PO TABS: 160.0000 mg | ORAL_TABLET | Freq: Every day | ORAL | 3 refills | Status: DC

## 2018-04-06 NOTE — Telephone Encounter (Signed)
Left the pt a message to call the office back to endorse non-coverage of Vascepa, and new substitute med Dr Delton SeeNelson would like for her to start instead, Fenofibrate 160 mg po daily.

## 2018-04-06 NOTE — Telephone Encounter (Signed)
Spoke with the pt and endorsed to her that her insurance carrier will not cover Vascepa at this time, and recommended a new substitute for Dr Delton SeeNelson to advise on. Informed the pt that Dr Delton SeeNelson recommends that we stop her Vascepa, and start her on Fenofibrate 160 mg po daily.   Confirmed the pharmacy of choice with the pt.  Endorsed to the pt that I will only send in a 30 day supply, to make sure she tolerates this appropriately.  Advised the pt to call the office back if she has any issues with taking this med, for potentially then, her insurance will more than likely approve Vascepa.  Pt verbalized understanding and agrees with this plan.

## 2018-04-06 NOTE — Telephone Encounter (Signed)
Folow Up:      Returning your call from this morning.

## 2018-04-10 MED ORDER — FENOFIBRATE 145 MG PO TABS: 145.0000 mg | ORAL_TABLET | Freq: Every day | ORAL | 3 refills | Status: DC

## 2018-04-10 NOTE — Telephone Encounter (Signed)
**Note De-Identified  Obfuscation** The pt is advised of this Fenofibrate dose change and she states that she will let me know if her insurance does not cover it either.

## 2018-04-10 NOTE — Telephone Encounter (Signed)
Please start 145 mg, thank you

## 2018-04-10 NOTE — Telephone Encounter (Signed)
**Note De-Identified  Obfuscation** In most cases Medicaid will cover Fenofibrate 48 mg or 145 mg but not 160 mg. Will either of these doses work? Please advise.

## 2018-04-10 NOTE — Addendum Note (Signed)
Addended by: Demetrios LollVIA, PATRICIA M on: 04/10/2018 10:19 AM   Modules accepted: Orders

## 2018-04-10 NOTE — Telephone Encounter (Signed)
**Note De-Identified  Obfuscation** I have [facility] new RX for Fenobrate 145 [facility] in hopes that the [place] med[place] will [place].  I have left a message on the pts VM asking her to call me back.

## 2018-04-13 DIAGNOSIS — H5213 Myopia, bilateral: Secondary | ICD-10-CM | POA: Diagnosis not present

## 2018-04-13 DIAGNOSIS — H524 Presbyopia: Secondary | ICD-10-CM | POA: Diagnosis not present

## 2018-04-25 ENCOUNTER — Other Ambulatory Visit: Payer: Self-pay | Admitting: Family Medicine

## 2018-04-25 DIAGNOSIS — J328 Other chronic sinusitis: Secondary | ICD-10-CM

## 2018-04-25 DIAGNOSIS — E1142 Type 2 diabetes mellitus with diabetic polyneuropathy: Secondary | ICD-10-CM

## 2018-04-25 DIAGNOSIS — R110 Nausea: Secondary | ICD-10-CM

## 2018-04-25 MED FILL — LANTUS SOLOSTAR 100 UNITS/M: 100 | 30 days supply | Qty: 15 | Fill #0

## 2018-04-25 MED FILL — ACCU-CHEK AVIVA PLUS TEST S: 30 days supply | Qty: 100 | Fill #6

## 2018-04-25 MED FILL — PANTOPRAZOLE SOD DR 40 MG T: 40 | 30 days supply | Qty: 30 | Fill #3

## 2018-04-25 MED FILL — CLOPIDOGREL 75 MG TABLET: 75 | 30 days supply | Qty: 30 | Fill #3

## 2018-04-25 MED FILL — ACCU-CHEK SOFTCLIX LANCETS: 30 days supply | Qty: 100 | Fill #6

## 2018-04-25 MED FILL — LISINOPRIL 20 MG TAB: 20 | 30 days supply | Qty: 30 | Fill #0

## 2018-04-25 MED FILL — DICYCLOMINE 10 MG CAPSULE: 10 | 30 days supply | Qty: 30 | Fill #0

## 2018-04-25 MED FILL — FENOFIBRATE 145 MG TABLET: 145 | 30 days supply | Qty: 30 | Fill #0

## 2018-05-01 MED FILL — GABAPENTIN 300 MG CAPSULE: 300 | 30 days supply | Qty: 60 | Fill #0

## 2018-05-01 MED FILL — METOCLOPRAMIDE 10 MG TABLET: 10 | 13 days supply | Qty: 40 | Fill #0

## 2018-05-02 DIAGNOSIS — H524 Presbyopia: Secondary | ICD-10-CM | POA: Diagnosis not present

## 2018-05-26 ENCOUNTER — Other Ambulatory Visit: Payer: Self-pay | Admitting: Family Medicine

## 2018-05-26 DIAGNOSIS — F329 Major depressive disorder, single episode, unspecified: Secondary | ICD-10-CM

## 2018-05-26 DIAGNOSIS — F32A Depression, unspecified: Secondary | ICD-10-CM

## 2018-05-26 MED FILL — FENOFIBRATE 145 MG TABLET: 145 | 30 days supply | Qty: 30 | Fill #1

## 2018-05-26 MED FILL — traZODone HCL 50 MG TABS: 50 | 30 days supply | Qty: 30 | Fill #2

## 2018-05-26 MED FILL — CETIRIZINE HCL 10 MG TABS: 10 | 30 days supply | Qty: 30 | Fill #0

## 2018-05-26 MED FILL — DICYCLOMINE 10 MG CAPSULE: 10 | 30 days supply | Qty: 30 | Fill #1

## 2018-05-26 MED FILL — LISINOPRIL 20 MG TAB: 20 | 30 days supply | Qty: 30 | Fill #1

## 2018-05-29 ENCOUNTER — Ambulatory Visit: Payer: Medicaid Other | Admitting: Family Medicine

## 2018-05-29 MED FILL — BUPROPION SR 150 MG TABLET: 150 | 30 days supply | Qty: 60 | Fill #0

## 2018-05-29 MED FILL — METHOCARBAMOL 500 MG TABS: 500 | 30 days supply | Qty: 90 | Fill #0

## 2018-06-26 ENCOUNTER — Other Ambulatory Visit: Payer: Medicaid Other | Admitting: *Deleted

## 2018-06-26 DIAGNOSIS — E782 Mixed hyperlipidemia: Secondary | ICD-10-CM | POA: Diagnosis not present

## 2018-06-26 DIAGNOSIS — I2510 Atherosclerotic heart disease of native coronary artery without angina pectoris: Secondary | ICD-10-CM | POA: Diagnosis not present

## 2018-06-27 LAB — LIPID PANEL
Chol/HDL Ratio: 3.5 ratio (ref 0.0–4.4)
Cholesterol, Total: 140 mg/dL (ref 100–199)
HDL: 40 mg/dL (ref >39)
LDL Calculated: 59 mg/dL (ref 0–99)
Triglycerides: 203 mg/dL — ABNORMAL HIGH (ref 0–149)
VLDL Cholesterol Cal: 41 mg/dL — ABNORMAL HIGH (ref 5–40)

## 2018-06-28 ENCOUNTER — Encounter: Payer: Self-pay | Admitting: Cardiology

## 2018-06-28 ENCOUNTER — Ambulatory Visit (INDEPENDENT_AMBULATORY_CARE_PROVIDER_SITE_OTHER): Payer: Medicaid Other | Admitting: Cardiology

## 2018-06-28 VITALS — BP 122/94 | HR 79 | Ht 63.0 in | Wt 175.8 lb

## 2018-06-28 DIAGNOSIS — I2510 Atherosclerotic heart disease of native coronary artery without angina pectoris: Secondary | ICD-10-CM | POA: Diagnosis not present

## 2018-06-28 DIAGNOSIS — E785 Hyperlipidemia, unspecified: Secondary | ICD-10-CM | POA: Diagnosis not present

## 2018-06-28 DIAGNOSIS — I10 Essential (primary) hypertension: Secondary | ICD-10-CM

## 2018-06-28 DIAGNOSIS — M791 Myalgia, unspecified site: Secondary | ICD-10-CM | POA: Diagnosis not present

## 2018-06-28 DIAGNOSIS — T466X5A Adverse effect of antihyperlipidemic and antiarteriosclerotic drugs, initial encounter: Secondary | ICD-10-CM | POA: Diagnosis not present

## 2018-06-28 NOTE — Progress Notes (Signed)
Cardiology Office Note:    Date:  06/28/2018   ID:  Sarah Charles, DOB Oct 06, 1960, MRN [id number]  PCP:  Charlott Rakes, MD  Cardiologist:  Ena Dawley, MD  Referring MD: Charlott Rakes, MD   Chief complaint: Fatigue  History of Present Illness:    Sarah Charles is a 58 y.o. female with a past medical history significant for chin, diabetes type 2, hyperlipidemia, obesity, tobacco abuse, depression, GERD and CAD S/P DES to prox LAD 10/2017.  Sarah Charles had gone to the [facility], ED 10/29/2017 for evaluation of chest pressure with diaphoresis, nausea and shortness of breath.  She has a strong family history significant for CAD with her father dying at age 38 of MI.  She ruled out for MI and was transferred to [facility] where she underwent cardiac CTA that showed significant mid LAD stenosis by FFR.  She was discharged from the hospital and came in later for outpatient cardiac catheterization on 11/04/2017 which revealed 75% stenosis of the proximal LAD that was treated with a drug-eluting stent.  She was started on dual antiplatelet therapy for at least 6 months.  Her blood pressures were elevated during her hospitalization  Her husband had 3 stents 2 months ago and this is what prompted her to have her symptoms evaluated. She also notes that she had had left arm pain. Since her stent she has had some dull chest pain sometimes at night. She has taken NTG once since she was discharged. Her chest pain is mild and vague. This does not feel like her discomfort prior to the stent. No further left arm pain. She has gotten back to her daily activities and feels a lot better than prior to the stent. No chest pain or shortness of breath with exertion.   Smokes ~1/2 PPD.   03/23/2018 -she is coming after 3 months, she denies any chest pain or significant shortness of breath, she walks the entire shopping mall 3 times a week with no significant symptoms but enjoys it.  She just overall feels like she has no  energy and started herself on vitamin B12.  Denies any palpitation dizziness or syncope.  She is tolerating her medications well.  06/28/2018 -this is 4 months follow-up, she has been doing well, denies any chest pain or shortness of breath, she continues to smoke, and his dry cough.  She just went over upper respiratory infection with no significant fevers.  She is complaining for leg pain not related to exertion or walking for about the month.   Past Medical History:  Diagnosis Date  . Arthritis   . Coronary artery calcification seen on CT scan    a. 07/2017 noted on CT abd.  . Depression   . GERD (gastroesophageal reflux disease)   . Hyperlipidemia   . Hypertension   . Obesity   . Sleep apnea    a. Does not use CPAP "cause i dont think I need it anymore".  . Tobacco abuse   . Type II diabetes mellitus ([place])     Past Surgical History:  Procedure Laterality Date  . CHOLECYSTECTOMY    . CORONARY STENT INTERVENTION N/A 11/04/2017   Procedure: CORONARY STENT INTERVENTION;  Surgeon: Martinique, Peter M, MD;  Location: [place] [facility];  Service: Cardiovascular;  Laterality: N/A;  . INCISION AND DRAINAGE ABSCESS Left 12/22/2012   Procedure: INCISION AND DRAINAGE ABSCESS;  Surgeon: Jamesetta So, MD;  Location: [facility];  Service: General;  Laterality: Left;  . INTRAVASCULAR  PRESSURE WIRE/FFR STUDY N/A 11/04/2017   Procedure: INTRAVASCULAR PRESSURE WIRE/FFR STUDY;  Surgeon: Martinique, Peter M, MD;  Location: [place] [facility];  Service: Cardiovascular;  Laterality: N/A;  . LEFT HEART CATH AND CORONARY ANGIOGRAPHY N/A 11/04/2017   Procedure: LEFT HEART CATH AND CORONARY ANGIOGRAPHY;  Surgeon: Martinique, Peter M, MD;  Location: [place] [facility];  Service: Cardiovascular;  Laterality: N/A;  . TONSILLECTOMY    . TUBAL LIGATION     Current Medications: Current Meds  Medication Sig  . aspirin EC 81 MG tablet Take 81 mg by mouth daily.  Marland Kitchen atorvastatin (LIPITOR) 80 MG tablet Take 1 tablet (80 mg total)  by mouth daily.  . Blood Glucose Monitoring Suppl (ACCU-CHEK AVIVA PLUS) w/Device KIT 1 each by Does not apply route 3 (three) times daily.  Marland Kitchen buPROPion (WELLBUTRIN SR) 150 MG 12 hr tablet TAKE 1 TABLET BY MOUTH 2 TIMES DAILY.  . cetirizine (ZYRTEC) 10 MG tablet TAKE 1 TABLET BY MOUTH DAILY.  Marland Kitchen clopidogrel (PLAVIX) 75 MG tablet Take 1 tablet (75 mg total) by mouth daily.  Marland Kitchen dicyclomine (BENTYL) 10 MG capsule Take 1 capsule by mouth daily.  . fenofibrate (TRICOR) 145 MG tablet Take 1 tablet (145 mg total) by mouth daily.  Marland Kitchen gabapentin (NEURONTIN) 300 MG capsule TAKE 1 CAPSULE (300 MG TOTAL) BY MOUTH 2 (TWO) TIMES DAILY.  Marland Kitchen glucose blood (ACCU-CHEK AVIVA) test strip Use as instructed  . Insulin Glargine (LANTUS SOLOSTAR) 100 UNIT/ML Solostar Pen Inject 45 Units into the skin at bedtime.  . Insulin Pen Needle (PEN NEEDLES) 31G X 5 MM MISC 35 Units by Does not apply route daily.  Elmore Guise Devices (ACCU-CHEK SOFTCLIX) lancets Use as instructed  . lisinopril (PRINIVIL,ZESTRIL) 20 MG tablet Take 1 tablet (20 mg total) by mouth daily.  . methocarbamol (ROBAXIN) 500 MG tablet TAKE 1 TABLET BY MOUTH 3 TIMES DAILY.  Marland Kitchen metoCLOPramide (REGLAN) 10 MG tablet TAKE 1 TABLET BY MOUTH EVERY EIGHT HOURS AS NEEDED FOR NAUSEA OR VOMITING.  . Multiple Vitamin (MULTIVITAMIN) tablet Take 1 tablet by mouth daily.  . nitroGLYCERIN (NITROSTAT) 0.4 MG SL tablet Place 0.4 mg under the tongue every 5 (five) minutes as needed for chest pain.  . pantoprazole (PROTONIX) 40 MG tablet Take 1 tablet (40 mg total) by mouth daily.  . sitaGLIPtin-metformin (JANUMET) 50-1000 MG tablet Take 1 tablet by mouth 2 (two) times daily with a meal.  . traZODone (DESYREL) 50 MG tablet Take 1 tablet (50 mg total) by mouth at bedtime as needed for sleep.  . TRUEPLUS LANCETS 28G MISC Use as directed  . vitamin B-12 (CYANOCOBALAMIN) 500 MCG tablet Take 500 mcg by mouth daily.  . vitamin C (ASCORBIC ACID) 500 MG tablet Take 500 mg by mouth daily.      Allergies:   Cymbalta [duloxetine hcl]   Social History   Socioeconomic History  . Marital status: Married    Spouse name: Not on file  . Number of children: Not on file  . Years of education: Not on file  . Highest education level: Not on file  Occupational History  . Not on file  Social Needs  . Financial resource strain: Not on file  . Food insecurity:    Worry: Not on file    Inability: Not on file  . Transportation needs:    Medical: Not on file    Non-medical: Not on file  Tobacco Use  . Smoking status: Current Every Day Smoker    Packs/day:  1.00    Years: 41.00    Pack years: 41.00    Types: Cigarettes  . Smokeless tobacco: Never Used  Substance and Sexual Activity  . Alcohol use: No  . Drug use: No  . Sexual activity: Never    Birth control/protection: None  Lifestyle  . Physical activity:    Days per week: Not on file    Minutes per session: Not on file  . Stress: Not on file  Relationships  . Social connections:    Talks on phone: Not on file    Gets together: Not on file    Attends religious service: Not on file    Active member of club or organization: Not on file    Attends meetings of clubs or organizations: Not on file    Relationship status: Not on file  Other Topics Concern  . Not on file  Social History Narrative   Lives in [place] with husband.  Unemployed.  Does not routinely exercise.     Family History: The patient's family history includes Breast cancer in her mother; CAD in her father; COPD in her mother; CVA in her mother; Cancer in her maternal grandfather and maternal uncle; Diabetes in her maternal grandmother and sister; Heart attack in her father and mother; Heart disease in her mother; Hypercholesterolemia in her father; Hypertension in her mother; Kidney Stones in her mother. ROS:   Please see the history of present illness.     All other systems reviewed and are negative.  EKGs/Labs/Other Studies Reviewed:    The  following studies were reviewed today:    Left heart cath 11/04/17:  Prox LAD lesion is 70% stenosed.  Prox Cx to Mid Cx lesion is 30% stenosed.  Prox RCA to Mid RCA lesion is 20% stenosed.  Post intervention, there is a 0% residual stenosis.  A drug-eluting stent was successfully placed using a STENT SIERRA 2.50 X 15 MM.  LV end diastolic pressure is normal.  1. Single vessel obstructive CAD. - 70% mid LAD immediately after the first diagonal. FFR 0.78 2. Normal LVEDP 3. Successful PCI of the mid LAD with DES  Plan: DAPT for at least 6 months. She is a candidate for same day discharge. Will hold metformin for 48 hours. Will need to switch Prilosec to Protonix.   Echo 10/30/17: Study Conclusions  - Left ventricle: The cavity size was normal. Systolic function was normal. The estimated ejection fraction was in the range of 55% to 60%.   EKG:  EKG is not ordered today.    Recent Labs: 03/23/2018: ALT 12; BUN 16; Creatinine, Ser 0.60; Hemoglobin 14.9; Platelets 313; Potassium 4.3; Sodium 138; TSH 2.870   Recent Lipid Panel    Component Value Date/Time   CHOL 140 06/26/2018 0000   TRIG 203 (H) 06/26/2018 0000   HDL 40 06/26/2018 0000   CHOLHDL 3.5 06/26/2018 0000   CHOLHDL 9.7 (H) 07/23/2016 1638   VLDL NOT CALC 07/23/2016 1638   LDLCALC 59 06/26/2018 0000   Physical Exam:    VS:  BP (!) 122/94   Pulse 79   Ht '5\' 3"'  (1.6 m)   Wt 175 lb 12.8 oz (79.7 kg)   SpO2 98%   BMI 31.14 kg/m     Wt Readings from Last 3 Encounters:  06/28/18 175 lb 12.8 oz (79.7 kg)  03/23/18 185 lb (83.9 kg)  03/15/18 188 lb (85.3 kg)    Physical Exam  Constitutional: She is oriented to person, place, and  time. She appears well-developed and well-nourished. No distress.  HENT:  Head: Normocephalic and atraumatic.  Neck: Normal range of motion. Neck supple. No JVD present.  Cardiovascular: Normal rate, regular rhythm, normal heart sounds and intact distal pulses. Exam  reveals no gallop and no friction rub.  No murmur heard. Pulmonary/Chest: Effort normal and breath sounds normal. No respiratory distress. She has no wheezes. She has no rales.  Abdominal: Soft. Bowel sounds are normal.  Musculoskeletal: Normal range of motion.        General: No edema.  Neurological: She is alert and oriented to person, place, and time.  Skin: Skin is warm and dry.  Psychiatric: She has a normal mood and affect. Her behavior is normal. Thought content normal.  Vitals reviewed.  Right radial cath site healed.    ASSESSMENT:    1. Coronary artery disease involving native coronary artery of native heart without angina pectoris   2. Essential hypertension   3. Myalgia due to statin   4. Hyperlipidemia, unspecified hyperlipidemia type    PLAN:    In order of problems listed above:  CAD status post DES to mid LAD: on dual antiplatelet therapy, no bleeding, tolerating it well.  We will continue beta-blocker, statin and ACE inhibitor.    Hypertension: Well-controlled.  Hyperlipidemia: She is currently on atorvastatin, fenofibrate and fish oil, LDL 59, HDL 40 and triglycerides decreased from 572 --> 203.  She is advised to hold atorvastatin for a week and if her leg pain does not improve restart atorvastatin otherwise give [place] a call and we will refer for PCSK9 inhibitors.  Diabetes type II on insulin: Last hemoglobin A1c was 8.6, has been 10.8 prior to that. Try to get A1c under 7.   Tobacco abuse: Smokes ~1/2 PPD.  Struggling with quitting.  Medication Adjustments/Labs and Tests Ordered: Current medicines are reviewed at length with the patient today.  Concerns regarding medicines are outlined above. Labs and tests ordered and medication changes are outlined in the patient instructions below:  Patient Instructions  Medication Instructions:   HOLD YOUR ATORVASTATIN FOR ONE WEEK THEN CALL THE OFFICE BACK AT [phone] TO REPORT YOUR SYMPTOMS  If you need a refill  on your cardiac medications before your next appointment, please call your pharmacy.      Follow-Up:  4 MONTHS WITH AN EXTENDER IN THE OFFICE.  *PLEASE CALL THE OFFICE BACK IN ONE WEEK TO REPORT HOW YOUR SYMPTOMS ARE SINCE HOLDING ATORVASTATIN--[phone]*    Signed, Ena Dawley, MD  06/28/2018 3:14 PM    [facility]

## 2018-06-28 NOTE — Patient Instructions (Signed)
Medication Instructions:   HOLD YOUR ATORVASTATIN FOR ONE WEEK THEN CALL THE OFFICE BACK AT [phone] TO REPORT YOUR SYMPTOMS  If you need a refill on your cardiac medications before your next appointment, please call your pharmacy.      Follow-Up:  4 MONTHS WITH AN EXTENDER IN THE OFFICE.  *PLEASE CALL THE OFFICE BACK IN ONE WEEK TO REPORT HOW YOUR SYMPTOMS ARE SINCE HOLDING ATORVASTATIN--[phone]*

## 2018-07-04 ENCOUNTER — Telehealth: Payer: Self-pay | Admitting: *Deleted

## 2018-07-04 MED ORDER — ATORVASTATIN CALCIUM 80 MG PO TABS: 80.0000 mg | ORAL_TABLET | Freq: Every day | ORAL | 3 refills | Status: DC

## 2018-07-04 NOTE — Telephone Encounter (Signed)
Follow up  ° ° °Patient is returning your call. °

## 2018-07-04 NOTE — Telephone Encounter (Signed)
I would just restart atorvastatin

## 2018-07-04 NOTE — Telephone Encounter (Signed)
-----   Message from Loa Socks, LPN sent at 0/01/4075  3:12 PM EST ----- Regarding: CALL IN ONE WEEK FOR ATORVASTATIN HOLD CALL THIS PT IN ONE WEEK TO ASK HOW HER SYMPTOMS ARE SINCE STOPPING ATORVASTATIN PER DR Delton See

## 2018-07-04 NOTE — Telephone Encounter (Signed)
Spoke with the pt and asked how her myalgias are doing since Dr Delton See d/c'ed her atorvastatin at last week's OV.  Pt reports that her symptoms are still the same, with the stop of this med.  Pt states she stopped taking this med, same day Dr Delton See saw her in clinic.  Informed the pt that I will route this message to Dr Delton See to review and advise on this issue, and to advise if she should now restart back taking her atorvastatin 80 mg once daily, or switch to a different regimen.  Informed the pt that once Dr Delton See gets back to me with further recommendations, I will follow-up with her shortly thereafter.  Pt verbalized understanding and agrees with this plan.

## 2018-07-04 NOTE — Telephone Encounter (Signed)
Spoke with the pt and informed her that per Dr Delton See, given her symptoms continued with the stop of her atorvastatin, she now recommends that she restart taking this med.  Asked the pt if she needed refills at this time, and pt states she has enough on hand, and will call when all her refills are out.  Pt verbalized understanding and agrees with this plan.

## 2018-07-04 NOTE — Telephone Encounter (Signed)
Left the pt a message to call back to ask how her symptoms are since Dr Delton See stopped her atorvastatin, at last weeks OV.

## 2018-07-04 NOTE — Addendum Note (Signed)
Addended by: Loa Socks on: 07/04/2018 02:53 PM   Modules accepted: Orders

## 2018-07-04 NOTE — Telephone Encounter (Signed)
Left the pt a message to call back to endorse recommendations per Dr. Delton SeeNelson, for her to resume taking her atorvastatin.

## 2018-07-18 ENCOUNTER — Emergency Department (HOSPITAL_COMMUNITY)
Admission: EM | Admit: 2018-07-18 | Discharge: 2018-07-19 | Disposition: A | Discharge status: Home / Self Care | Payer: Medicaid Other | Attending: Emergency Medicine | Admitting: Emergency Medicine

## 2018-07-18 ENCOUNTER — Encounter (HOSPITAL_COMMUNITY): Payer: Self-pay | Admitting: Emergency Medicine

## 2018-07-18 ENCOUNTER — Other Ambulatory Visit: Payer: Self-pay

## 2018-07-18 DIAGNOSIS — Z7982 Long term (current) use of aspirin: Secondary | ICD-10-CM | POA: Insufficient documentation

## 2018-07-18 DIAGNOSIS — Z79899 Other long term (current) drug therapy: Secondary | ICD-10-CM | POA: Diagnosis not present

## 2018-07-18 DIAGNOSIS — M545 Low back pain: Secondary | ICD-10-CM | POA: Diagnosis not present

## 2018-07-18 DIAGNOSIS — F17210 Nicotine dependence, cigarettes, uncomplicated: Secondary | ICD-10-CM | POA: Insufficient documentation

## 2018-07-18 DIAGNOSIS — R102 Pelvic and perineal pain: Secondary | ICD-10-CM | POA: Diagnosis not present

## 2018-07-18 DIAGNOSIS — I2510 Atherosclerotic heart disease of native coronary artery without angina pectoris: Secondary | ICD-10-CM | POA: Diagnosis not present

## 2018-07-18 DIAGNOSIS — I10 Essential (primary) hypertension: Secondary | ICD-10-CM | POA: Diagnosis not present

## 2018-07-18 DIAGNOSIS — Z7902 Long term (current) use of antithrombotics/antiplatelets: Secondary | ICD-10-CM | POA: Diagnosis not present

## 2018-07-18 DIAGNOSIS — E119 Type 2 diabetes mellitus without complications: Secondary | ICD-10-CM | POA: Insufficient documentation

## 2018-07-18 DIAGNOSIS — N3001 Acute cystitis with hematuria: Secondary | ICD-10-CM

## 2018-07-18 LAB — URINALYSIS, ROUTINE W REFLEX MICROSCOPIC
Bacteria, UA: NONE SEEN
Bilirubin Urine: NEGATIVE
Glucose, UA: >=500 mg/dL — AB
Ketones, ur: NEGATIVE mg/dL
Nitrite: NEGATIVE
Protein, ur: 30 mg/dL — AB
RBC / HPF: >50 RBC/hpf — ABNORMAL HIGH (ref 0–5)
Specific Gravity, Urine: 1.033 — ABNORMAL HIGH (ref 1.005–1.030)
WBC, UA: >50 WBC/hpf — ABNORMAL HIGH (ref 0–5)
pH: 8 (ref 5.0–8.0)

## 2018-07-18 NOTE — ED Triage Notes (Signed)
Pt c/o of urinary frequency, burning with urination x 2 days.

## 2018-07-19 LAB — CBG MONITORING, ED: Glucose-Capillary: 257 mg/dL — ABNORMAL HIGH (ref 70–99)

## 2018-07-19 LAB — WET PREP, GENITAL
Clue Cells Wet Prep HPF POC: NONE SEEN
Sperm: NONE SEEN
Trich, Wet Prep: NONE SEEN
Yeast Wet Prep HPF POC: NONE SEEN

## 2018-07-19 MED ORDER — CEPHALEXIN 500 MG PO CAPS
500.0000 mg | ORAL_CAPSULE | Freq: Once | ORAL | Status: AC
  Administered 2018-07-19: 500 mg via ORAL
  Filled 2018-07-19: qty 1

## 2018-07-19 MED ORDER — CEPHALEXIN 500 MG PO CAPS: 500.0000 mg | ORAL_CAPSULE | Freq: Two times a day (BID) | ORAL | 0 refills | Status: DC

## 2018-07-19 NOTE — ED Provider Notes (Signed)
[facility] EMERGENCY DEPARTMENT Provider Note   CSN: [id number] Arrival date & time: 07/18/18  2215    History   Chief Complaint Chief Complaint  Patient presents with  . Urinary Tract Infection    HPI Sarah Charles is a 58 y.o. female.     Type II diabetic on insulin presenting with vaginal pain since last night.  Also complains of burning with urination, frequency, urgency, hematuria and lower pubic pain.  Has some some lower back pain as well.  Denies any vaginal bleeding or vaginal discharge.  No fevers, chills, nausea, vomiting.  No chest pain or shortness of breath.  States she is concerned that she has a UTI.  States her sugars have been in the 200 range.  The history is provided by the patient.    Past Medical History:  Diagnosis Date  . Arthritis   . Coronary artery calcification seen on CT scan    a. 07/2017 noted on CT abd.  . Depression   . GERD (gastroesophageal reflux disease)   . Hyperlipidemia   . Hypertension   . Obesity   . Sleep apnea    a. Does not use CPAP "cause i dont think I need it anymore".  . Tobacco abuse   . Type II diabetes mellitus [facility])     Patient Active Problem List   Diagnosis Date Noted  . CAD (coronary artery disease) 11/22/2017  . Chest pain, precordial 11/04/2017  . Acute chest pain   . Type 2 diabetes mellitus with hyperlipidemia ([place]) 10/29/2017  . Unstable angina ([place]) 10/29/2017  . Diabetic polyneuropathy associated with type 2 diabetes mellitus ([place]) 05/30/2017  . Mixed dyslipidemia 05/30/2017  . Depression 05/31/2016  . Hypertension 05/31/2016  . Uncontrolled type 2 diabetes mellitus with complication ([place]) 02/54/2706  . Uncontrolled type 2 diabetes mellitus with complication, with long-term current use of insulin ([place]) 04/20/2015  . Cellulitis of breast 12/29/2013  . Diabetes mellitus ([place]) 12/29/2013  . Cellulitis of female breast 12/29/2013  . Symptomatic menopausal or female climacteric states 04/08/2008  . OTHER  SPECIFIED DISEASE OF NAIL 04/08/2008  . URINARY URGENCY 04/08/2008  . COUGH 04/02/2008  . HYPERTENSION, BENIGN ESSENTIAL 04/12/2007  . FATIGUE 04/12/2007  . OBESITY, NOS 07/21/2006  . Major depressive disorder, recurrent episode ([place]) 07/21/2006  . TOBACCO DEPENDENCE 07/21/2006  . GASTROESOPHAGEAL REFLUX, NO ESOPHAGITIS 07/21/2006  . INCONTINENCE, STRESS, FEMALE 07/21/2006    Past Surgical History:  Procedure Laterality Date  . CHOLECYSTECTOMY    . CORONARY STENT INTERVENTION N/A 11/04/2017   Procedure: CORONARY STENT INTERVENTION;  Surgeon: Martinique, Peter M, MD;  Location: [place] CV LAB;  Service: Cardiovascular;  Laterality: N/A;  . INCISION AND DRAINAGE ABSCESS Left 12/22/2012   Procedure: INCISION AND DRAINAGE ABSCESS;  Surgeon: Jamesetta So, MD;  Location: [facility];  Service: General;  Laterality: Left;  . INTRAVASCULAR PRESSURE WIRE/FFR STUDY N/A 11/04/2017   Procedure: INTRAVASCULAR PRESSURE WIRE/FFR STUDY;  Surgeon: Martinique, Peter M, MD;  Location: [place] CV LAB;  Service: Cardiovascular;  Laterality: N/A;  . LEFT HEART CATH AND CORONARY ANGIOGRAPHY N/A 11/04/2017   Procedure: LEFT HEART CATH AND CORONARY ANGIOGRAPHY;  Surgeon: Martinique, Peter M, MD;  Location: [place] CV LAB;  Service: Cardiovascular;  Laterality: N/A;  . TONSILLECTOMY    . TUBAL LIGATION       OB History   No obstetric history on file.      Home Medications    Prior to Admission medications   Medication Sig  Start Date End Date Taking? Authorizing Provider  amLODipine (NORVASC) 2.5 MG tablet Take 1 tablet (2.5 mg total) by mouth daily. 03/23/18 06/21/18  Dorothy Spark, MD  aspirin EC 81 MG tablet Take 81 mg by mouth daily.    [provider]  atorvastatin (LIPITOR) 80 MG tablet Take 1 tablet (80 mg total) by mouth daily. 07/04/18   Dorothy Spark, MD  Blood Glucose Monitoring Suppl (ACCU-CHEK AVIVA PLUS) w/Device KIT 1 each by Does not apply route 3 (three) times daily. 06/02/17    [name], FNP  buPROPion (WELLBUTRIN SR) 150 MG 12 hr tablet TAKE 1 TABLET BY MOUTH 2 TIMES DAILY. 05/27/18   Charlott Rakes, MD  cetirizine (ZYRTEC) 10 MG tablet TAKE 1 TABLET BY MOUTH DAILY. 04/27/18   Charlott Rakes, MD  clopidogrel (PLAVIX) 75 MG tablet Take 1 tablet (75 mg total) by mouth daily. 11/04/17   Duke, Tami Lin, PA  dicyclomine (BENTYL) 10 MG capsule Take 1 capsule by mouth daily. 05/26/18   [provider]  fenofibrate (TRICOR) 145 MG tablet Take 1 tablet (145 mg total) by mouth daily. 04/10/18   Dorothy Spark, MD  gabapentin (NEURONTIN) 300 MG capsule TAKE 1 CAPSULE (300 MG TOTAL) BY MOUTH 2 (TWO) TIMES DAILY. 04/28/18   Charlott Rakes, MD  glucose blood (ACCU-CHEK AVIVA) test strip Use as instructed 06/02/17   [name], FNP  Insulin Glargine (LANTUS SOLOSTAR) 100 UNIT/ML Solostar Pen Inject 45 Units into the skin at bedtime. 03/15/18   Charlott Rakes, MD  Insulin Pen Needle (PEN NEEDLES) 31G X 5 MM MISC 35 Units by Does not apply route daily. 08/26/17   Charlott Rakes, MD  Lancet Devices [facility]) lancets Use as instructed 06/02/17   [name], FNP  lisinopril (PRINIVIL,ZESTRIL) 20 MG tablet Take 1 tablet (20 mg total) by mouth daily. 03/15/18   Charlott Rakes, MD  methocarbamol (ROBAXIN) 500 MG tablet TAKE 1 TABLET BY MOUTH 3 TIMES DAILY. 05/27/18   Charlott Rakes, MD  metoCLOPramide (REGLAN) 10 MG tablet TAKE 1 TABLET BY MOUTH EVERY EIGHT HOURS AS NEEDED FOR NAUSEA OR VOMITING. 04/28/18   Charlott Rakes, MD  metoprolol tartrate (LOPRESSOR) 25 MG tablet Take 1 tablet (25 mg total) by mouth 2 (two) times daily. 11/22/17 03/23/18  Daune Perch, NP  Multiple Vitamin (MULTIVITAMIN) tablet Take 1 tablet by mouth daily.    [provider]  nitroGLYCERIN (NITROSTAT) 0.4 MG SL tablet Place 0.4 mg under the tongue every 5 (five) minutes as needed for chest pain.    [provider]  pantoprazole (PROTONIX) 40 MG  tablet Take 1 tablet (40 mg total) by mouth daily. 03/15/18   Charlott Rakes, MD  sitaGLIPtin-metformin (JANUMET) 50-1000 MG tablet Take 1 tablet by mouth 2 (two) times daily with a meal. 03/15/18   Charlott Rakes, MD  traZODone (DESYREL) 50 MG tablet Take 1 tablet (50 mg total) by mouth at bedtime as needed for sleep. 08/15/17   Charlott Rakes, MD  TRUEPLUS LANCETS 28G MISC Use as directed 05/30/17   [name], FNP  vitamin B-12 (CYANOCOBALAMIN) 500 MCG tablet Take 500 mcg by mouth daily.    [provider]  vitamin C (ASCORBIC ACID) 500 MG tablet Take 500 mg by mouth daily.    [provider]    Family History Family History  Problem Relation Age of Onset  . Hypertension Mother   . CVA Mother   . COPD Mother   . Heart  disease Mother   . Kidney Stones Mother   . Heart attack Mother        MI in late 28's  . Breast cancer Mother   . CAD Father   . Hypercholesterolemia Father   . Heart attack Father        Died @ 2 of MI  . Cancer Maternal Uncle   . Diabetes Maternal Grandmother   . Cancer Maternal Grandfather        lung cancer  . Diabetes Sister     Social History Social History   Tobacco Use  . Smoking status: Current Every Day Smoker    Packs/day: 1.00    Years: 41.00    Pack years: 41.00    Types: Cigarettes  . Smokeless tobacco: Never Used  Substance Use Topics  . Alcohol use: No  . Drug use: No     Allergies   Cymbalta [duloxetine hcl]   Review of Systems Review of Systems  Constitutional: Negative for activity change, appetite change and fever.  HENT: Negative for congestion and rhinorrhea.   Respiratory: Negative for cough, chest tightness and shortness of breath.   Cardiovascular: Negative for chest pain.  Gastrointestinal: Positive for abdominal pain. Negative for nausea and vomiting.  Genitourinary: Positive for dysuria, frequency, hematuria and urgency. Negative for vaginal bleeding and vaginal discharge.  Skin:  Negative for rash.  Neurological: Negative for dizziness, weakness and headaches.    all other systems are negative except as noted in the HPI and PMH.    Physical Exam Updated Vital Signs BP (!) 148/83 (BP Location: Right Arm)   Pulse 78   Temp 98.5 F (36.9 C) (Oral)   Resp 18   Ht '5\' 3"'  (1.6 m)   Wt 79.4 kg   SpO2 98%   BMI 31.00 kg/m   Physical Exam Vitals signs and nursing note reviewed.  Constitutional:      General: She is not in acute distress.    Appearance: She is well-developed.  HENT:     Head: Normocephalic and atraumatic.     Mouth/Throat:     Pharynx: No oropharyngeal exudate.  Eyes:     Conjunctiva/sclera: Conjunctivae normal.     Pupils: Pupils are equal, round, and reactive to light.  Neck:     Musculoskeletal: Normal range of motion and neck supple.     Comments: No meningismus. Cardiovascular:     Rate and Rhythm: Normal rate and regular rhythm.     Heart sounds: Normal heart sounds. No murmur.  Pulmonary:     Effort: Pulmonary effort is normal. No respiratory distress.     Breath sounds: Normal breath sounds.  Abdominal:     Palpations: Abdomen is soft.     Tenderness: There is abdominal tenderness. There is no guarding or rebound.     Comments: Mild suprapubic tenderness  Genitourinary:    Comments: Chaperone present.  Normal external genitalia.  No visible yeast. No discharge in vaginal vault.  No CMT.  There is mild suprapubic tenderness, no lateralizing adnexal tenderness Musculoskeletal: Normal range of motion.        General: Tenderness present.     Comments: Paraspinal lumbar tenderness, no midline tenderness  Skin:    General: Skin is warm.     Capillary Refill: Capillary refill takes less than 2 seconds.  Neurological:     General: No focal deficit present.     Mental Status: She is alert and oriented to person, place, and time. Mental status  is at baseline.     Cranial Nerves: No cranial nerve deficit.     Motor: No abnormal  muscle tone.     Coordination: Coordination normal.     Comments: No ataxia on finger to nose bilaterally. No pronator drift. 5/5 strength throughout. CN 2-12 intact.Equal grip strength. Sensation intact.   Psychiatric:        Behavior: Behavior normal.      ED Treatments / Results  Labs (all labs ordered are listed, but only abnormal results are displayed) Labs Reviewed  WET PREP, GENITAL - Abnormal; Notable for the following components:      Result Value   WBC, Wet Prep HPF POC MODERATE (*)    All other components within normal limits  URINALYSIS, ROUTINE W REFLEX MICROSCOPIC - Abnormal; Notable for the following components:   APPearance HAZY (*)    Specific Gravity, Urine 1.033 (*)    Glucose, UA >=500 (*)    Hgb urine dipstick LARGE (*)    Protein, ur 30 (*)    Leukocytes,Ua MODERATE (*)    RBC / HPF >50 (*)    WBC, UA >50 (*)    All other components within normal limits  CBG MONITORING, ED - Abnormal; Notable for the following components:   Glucose-Capillary 257 (*)    All other components within normal limits  URINE CULTURE  GC/CHLAMYDIA PROBE AMP ([place]) NOT AT [facility]    EKG None  Radiology No results found.  Procedures Procedures (including critical care time)  Medications Ordered in ED Medications - No data to display   Initial Impression / Assessment and Plan / ED Course  I have reviewed the triage vital signs and the nursing notes.  Pertinent labs & imaging results that were available during my care of the patient were reviewed by me and considered in my medical decision making (see chart for details).       2 days of frequency, urgency, hematuria and vaginal pain.  UA with many reds and many whites.  We will treat empirically for infection and send culture.  CBG 257.  Pelvic exam performed without obvious yeast infection or other explanation for her pain.  Patient will be treated empirically for cystitis with antibiotics while urine culture  is pending. She appears nontoxic with soft abdomen and is tolerating p.o.  Follow-up with PCP.  Return precautions discussed.  Final Clinical Impressions(s) / ED Diagnoses   Final diagnoses:  Acute cystitis with hematuria    ED Discharge Orders    None       Jamaya Sleeth, Annie [place], MD 07/19/18 [phone]

## 2018-07-19 NOTE — Discharge Instructions (Addendum)
Take the antibiotics as prescribed and follow-up with your doctor.  Return to the ED with new or worsening symptoms including pain, fever, vomiting or other concerns.

## 2018-07-20 LAB — GC/CHLAMYDIA PROBE AMP (~~LOC~~) NOT AT ARMC
Chlamydia: NEGATIVE
Neisseria Gonorrhea: NEGATIVE

## 2018-07-21 LAB — URINE CULTURE: Culture: >=100000 — AB

## 2018-07-22 ENCOUNTER — Telehealth: Payer: Self-pay | Admitting: Emergency Medicine

## 2018-07-22 NOTE — Telephone Encounter (Signed)
Post ED Visit - Positive Culture Follow-up  Culture report reviewed by antimicrobial stewardship pharmacist: [facility] Team []  Sarah Charles, Pharm.D. []  [address], Pharm.D., BCPS AQ-ID []  Sarah Charles, Pharm.D., BCPS []  Sarah Charles, [name].D., BCPS []  Sarah Charles, [place].D., BCPS, AAHIVP []  Sarah Charles, Pharm.D., BCPS, AAHIVP []  Sarah Charles, PharmD, BCPS []  Sarah Charles, PharmD, BCPS [x]  Sarah Charles, PharmD, BCPS []  Sarah Charles, PharmD []  Sarah Charles, PharmD, BCPS []  Sarah Charles, PharmD  [facility] []  Sarah Charles, PharmD []  Sarah Charles, PharmD []  Sarah Charles, PharmD []  Sarah Charles, Rph []  Sarah Charles, PharmD []  Sarah Charles, PharmD []  Sarah Charles, PharmD []  Sarah Charles, PharmD []  Sarah Charles, PharmD []  Sarah Charles, PharmD []  Lynann Beaver, PharmD []  Keturah Barre, PharmD []  Loralee Pacas, PharmD   Positive urine culture Treated with Cephalexin, organism sensitive to the same and no further patient follow-up is required at this time.  [name] [name] 07/22/2018, 9:38 AM

## 2018-08-01 ENCOUNTER — Ambulatory Visit: Payer: Medicaid Other | Admitting: Family Medicine

## 2018-09-12 ENCOUNTER — Ambulatory Visit: Payer: Medicaid Other | Admitting: Family Medicine

## 2018-09-25 ENCOUNTER — Telehealth: Payer: Self-pay

## 2018-09-25 DIAGNOSIS — R350 Frequency of micturition: Secondary | ICD-10-CM | POA: Diagnosis not present

## 2018-09-25 DIAGNOSIS — R739 Hyperglycemia, unspecified: Secondary | ICD-10-CM | POA: Diagnosis not present

## 2018-09-25 DIAGNOSIS — E119 Type 2 diabetes mellitus without complications: Secondary | ICD-10-CM | POA: Diagnosis not present

## 2018-09-25 NOTE — Telephone Encounter (Signed)
Tried to call pt to set up possible evisit. Pt number is not in service.

## 2018-09-26 NOTE — Telephone Encounter (Signed)
Obtained new number from mother. Left message for patient to call back.

## 2018-09-29 ENCOUNTER — Other Ambulatory Visit: Payer: Self-pay | Admitting: Family Medicine

## 2018-09-29 MED FILL — BUPROPION SR 150 MG TABLET: 150 | 30 days supply | Qty: 60 | Fill #1

## 2018-09-29 MED FILL — FENOFIBRATE 145 MG TABS: 145 | 30 days supply | Qty: 30 | Fill #2

## 2018-09-29 MED FILL — LISINOPRIL 20 MG TAB: 20 | 30 days supply | Qty: 30 | Fill #2

## 2018-09-29 MED FILL — CLOPIDOGREL 75 MG TABLET: 75 | 30 days supply | Qty: 30 | Fill #4

## 2018-09-29 MED FILL — DICYCLOMINE 10 MG CAPSULE: 10 | 30 days supply | Qty: 30 | Fill #2

## 2018-09-29 MED FILL — GABAPENTIN 300 MG CAPSULE: 300 | 30 days supply | Qty: 60 | Fill #1

## 2018-09-29 MED FILL — CETIRIZINE HCL 10 MG TABS: 10 | 30 days supply | Qty: 30 | Fill #1

## 2018-11-04 DIAGNOSIS — M79644 Pain in right finger(s): Secondary | ICD-10-CM | POA: Diagnosis not present

## 2018-11-04 DIAGNOSIS — L03011 Cellulitis of right finger: Secondary | ICD-10-CM | POA: Diagnosis not present

## 2018-11-08 ENCOUNTER — Other Ambulatory Visit: Payer: Self-pay | Admitting: Family Medicine

## 2018-11-08 MED FILL — BUPROPION SR 150 MG TABLET: 150 | 30 days supply | Qty: 60 | Fill #2

## 2018-11-08 MED FILL — LISINOPRIL 20 MG TABLET: 20 | 30 days supply | Qty: 30 | Fill #3

## 2018-11-08 MED FILL — DICYCLOMINE 10 MG CAPSULE: 10 | 30 days supply | Qty: 30 | Fill #3

## 2018-11-08 MED FILL — CETIRIZINE HCL 10 MG TABS: 10 | 30 days supply | Qty: 30 | Fill #2

## 2018-11-08 MED FILL — GABAPENTIN 300 MG CAPSULE: 300 | 30 days supply | Qty: 60 | Fill #2

## 2018-11-08 MED FILL — FENOFIBRATE 145 MG TABS: 145 | 30 days supply | Qty: 30 | Fill #3

## 2018-11-09 ENCOUNTER — Ambulatory Visit: Payer: Medicaid Other | Attending: Family Medicine | Admitting: Physician Assistant

## 2018-11-09 ENCOUNTER — Other Ambulatory Visit: Payer: Self-pay

## 2018-11-09 ENCOUNTER — Other Ambulatory Visit: Payer: Self-pay | Admitting: Physician Assistant

## 2018-11-09 VITALS — BP 111/70 | HR 88 | Temp 97.8°F | Resp 16 | Wt 160.4 lb

## 2018-11-09 DIAGNOSIS — R110 Nausea: Secondary | ICD-10-CM | POA: Diagnosis not present

## 2018-11-09 DIAGNOSIS — N3000 Acute cystitis without hematuria: Secondary | ICD-10-CM

## 2018-11-09 DIAGNOSIS — F32A Depression, unspecified: Secondary | ICD-10-CM

## 2018-11-09 DIAGNOSIS — I10 Essential (primary) hypertension: Secondary | ICD-10-CM | POA: Diagnosis not present

## 2018-11-09 DIAGNOSIS — E1142 Type 2 diabetes mellitus with diabetic polyneuropathy: Secondary | ICD-10-CM | POA: Diagnosis not present

## 2018-11-09 DIAGNOSIS — E782 Mixed hyperlipidemia: Secondary | ICD-10-CM | POA: Diagnosis not present

## 2018-11-09 DIAGNOSIS — L292 Pruritus vulvae: Secondary | ICD-10-CM | POA: Diagnosis not present

## 2018-11-09 DIAGNOSIS — K219 Gastro-esophageal reflux disease without esophagitis: Secondary | ICD-10-CM

## 2018-11-09 DIAGNOSIS — E118 Type 2 diabetes mellitus with unspecified complications: Secondary | ICD-10-CM

## 2018-11-09 DIAGNOSIS — R358 Other polyuria: Secondary | ICD-10-CM | POA: Diagnosis not present

## 2018-11-09 DIAGNOSIS — J328 Other chronic sinusitis: Secondary | ICD-10-CM | POA: Diagnosis not present

## 2018-11-09 DIAGNOSIS — N898 Other specified noninflammatory disorders of vagina: Secondary | ICD-10-CM | POA: Diagnosis not present

## 2018-11-09 DIAGNOSIS — G4730 Sleep apnea, unspecified: Secondary | ICD-10-CM | POA: Diagnosis not present

## 2018-11-09 DIAGNOSIS — I2510 Atherosclerotic heart disease of native coronary artery without angina pectoris: Secondary | ICD-10-CM | POA: Insufficient documentation

## 2018-11-09 DIAGNOSIS — Z79899 Other long term (current) drug therapy: Secondary | ICD-10-CM | POA: Insufficient documentation

## 2018-11-09 DIAGNOSIS — F329 Major depressive disorder, single episode, unspecified: Secondary | ICD-10-CM

## 2018-11-09 DIAGNOSIS — Z888 Allergy status to other drugs, medicaments and biological substances status: Secondary | ICD-10-CM | POA: Diagnosis not present

## 2018-11-09 DIAGNOSIS — Z7982 Long term (current) use of aspirin: Secondary | ICD-10-CM | POA: Insufficient documentation

## 2018-11-09 DIAGNOSIS — E1165 Type 2 diabetes mellitus with hyperglycemia: Secondary | ICD-10-CM

## 2018-11-09 DIAGNOSIS — Z794 Long term (current) use of insulin: Secondary | ICD-10-CM | POA: Diagnosis not present

## 2018-11-09 DIAGNOSIS — IMO0002 Reserved for concepts with insufficient information to code with codable children: Secondary | ICD-10-CM

## 2018-11-09 LAB — POCT URINALYSIS DIP (CLINITEK)
Bilirubin, UA: NEGATIVE
Blood, UA: NEGATIVE
Glucose, UA: 500 mg/dL — AB
Ketones, POC UA: NEGATIVE mg/dL
Nitrite, UA: NEGATIVE
POC PROTEIN,UA: NEGATIVE
Spec Grav, UA: <=1.005 — AB (ref 1.010–1.025)
Urobilinogen, UA: 0.2 E.U./dL
pH, UA: 5 (ref 5.0–8.0)

## 2018-11-09 LAB — GLUCOSE, POCT (MANUAL RESULT ENTRY)
POC Glucose: 566 mg/dl — AB (ref 70–99)
POC Glucose: 422 mg/dl — AB (ref 70–99)

## 2018-11-09 LAB — POCT GLYCOSYLATED HEMOGLOBIN (HGB A1C): Hemoglobin A1C: 13.4 % — AB (ref 4.0–5.6)

## 2018-11-09 MED ORDER — SODIUM CHLORIDE 0.9 % IV BOLUS
1000.0000 mL | Freq: Once | INTRAVENOUS | Status: AC
  Administered 2018-11-09: 12:00:00 1000 mL via INTRAVENOUS

## 2018-11-09 MED ORDER — METOCLOPRAMIDE HCL 10 MG PO TABS: ORAL_TABLET | ORAL | 0 refills | Status: DC

## 2018-11-09 MED ORDER — ATORVASTATIN CALCIUM 80 MG PO TABS: 80.0000 mg | ORAL_TABLET | Freq: Every day | ORAL | 3 refills | Status: DC

## 2018-11-09 MED ORDER — TRAZODONE HCL 50 MG PO TABS: 50.0000 mg | ORAL_TABLET | Freq: Every evening | ORAL | 2 refills | Status: DC | PRN

## 2018-11-09 MED ORDER — FLUCONAZOLE 150 MG PO TABS: ORAL_TABLET | ORAL | 0 refills | Status: DC

## 2018-11-09 MED ORDER — LANTUS SOLOSTAR 100 UNIT/ML ~~LOC~~ SOPN: 45.0000 [IU] | PEN_INJECTOR | Freq: Every day | SUBCUTANEOUS | 2 refills | Status: DC

## 2018-11-09 MED ORDER — GABAPENTIN 300 MG PO CAPS: 300.0000 mg | ORAL_CAPSULE | Freq: Two times a day (BID) | ORAL | 2 refills | Status: DC

## 2018-11-09 MED ORDER — METOPROLOL TARTRATE 25 MG PO TABS: 25.0000 mg | ORAL_TABLET | Freq: Two times a day (BID) | ORAL | 12 refills | Status: DC

## 2018-11-09 MED ORDER — ACCU-CHEK FASTCLIX LANCET KIT: PACK | 0 refills | Status: DC

## 2018-11-09 MED ORDER — PEN NEEDLES 31G X 5 MM MISC: 35.0000 [IU] | Freq: Every day | 11 refills | Status: DC

## 2018-11-09 MED ORDER — BUPROPION HCL ER (SR) 150 MG PO TB12: 150.0000 mg | ORAL_TABLET | Freq: Two times a day (BID) | ORAL | 2 refills | Status: DC

## 2018-11-09 MED ORDER — AMLODIPINE BESYLATE 2.5 MG PO TABS: 2.5000 mg | ORAL_TABLET | Freq: Every day | ORAL | 3 refills | Status: DC

## 2018-11-09 MED ORDER — CLOPIDOGREL BISULFATE 75 MG PO TABS: 75.0000 mg | ORAL_TABLET | Freq: Every day | ORAL | 2 refills | Status: DC

## 2018-11-09 MED ORDER — FENOFIBRATE 145 MG PO TABS: 145.0000 mg | ORAL_TABLET | Freq: Every day | ORAL | 3 refills | Status: DC

## 2018-11-09 MED ORDER — LISINOPRIL 20 MG PO TABS: 20.0000 mg | ORAL_TABLET | Freq: Every day | ORAL | 6 refills | Status: DC

## 2018-11-09 MED ORDER — ACCU-CHEK GUIDE VI STRP: ORAL_STRIP | 12 refills | Status: DC

## 2018-11-09 MED ORDER — ACCU-CHEK FASTCLIX LANCETS MISC: 1.0000 | Freq: Three times a day (TID) | 3 refills | Status: DC

## 2018-11-09 MED ORDER — JANUMET 50-1000 MG PO TABS: 1.0000 | ORAL_TABLET | Freq: Two times a day (BID) | ORAL | 1 refills | Status: DC

## 2018-11-09 MED ORDER — INSULIN ASPART PROT & ASPART (70-30 MIX) 100 UNIT/ML ~~LOC~~ SUSP: 30.0000 [IU] | Freq: Once | SUBCUTANEOUS | Status: DC

## 2018-11-09 MED ORDER — CETIRIZINE HCL 10 MG PO TABS: 10.0000 mg | ORAL_TABLET | Freq: Every day | ORAL | 2 refills | Status: DC

## 2018-11-09 MED ORDER — INSULIN ASPART 100 UNIT/ML ~~LOC~~ SOLN
30.0000 [IU] | Freq: Once | SUBCUTANEOUS | Status: AC
  Administered 2018-11-09: 30 [IU] via SUBCUTANEOUS

## 2018-11-09 MED ORDER — PANTOPRAZOLE SODIUM 40 MG PO TBEC: 40.0000 mg | DELAYED_RELEASE_TABLET | Freq: Every day | ORAL | 3 refills | Status: DC

## 2018-11-09 MED ORDER — ACCU-CHEK GUIDE ME W/DEVICE KIT: 1.0000 | PACK | Freq: Three times a day (TID) | 0 refills | Status: DC

## 2018-11-09 MED FILL — METOPROLOL TARTRATE 25 MG T: 25 | 90 days supply | Qty: 180 | Fill #0

## 2018-11-09 MED FILL — TRUEPLUS PEN NDL 31GX3/16": 31G X 5 MM | 30 days supply | Qty: 100 | Fill #0

## 2018-11-09 MED FILL — traZODone HCL 50 MG TABS: 50 | 30 days supply | Qty: 30 | Fill #0

## 2018-11-09 MED FILL — LANTUS SOLOSTAR 100 UNITS/M: 100 | 33 days supply | Qty: 15 | Fill #0

## 2018-11-09 MED FILL — FLUCONAZOLE 150 MG TABS: 150 | 14 days supply | Qty: 2 | Fill #0

## 2018-11-09 MED FILL — AMLODIPINE BESYLATE 2.5 MG: 2.5 | 90 days supply | Qty: 90 | Fill #0

## 2018-11-09 MED FILL — METOCLOPRAMIDE 10 MG TABLET: 10 | 13 days supply | Qty: 40 | Fill #0

## 2018-11-09 MED FILL — ACCU-CHEK GUIDE TEST STRIP: 30 days supply | Qty: 100 | Fill #0

## 2018-11-09 MED FILL — TRUEPLUS PEN NDL 31GX3/16: 31G X 5 MM | 30 days supply | Qty: 100 | Fill #0

## 2018-11-09 MED FILL — PANTOPRAZOLE SOD DR 40 MG T: 40 | 90 days supply | Qty: 90 | Fill #0

## 2018-11-09 MED FILL — ACCU-CHEK FASTCLIX LANCETS: 30 days supply | Qty: 102 | Fill #0

## 2018-11-09 MED FILL — ACCU-CHEK GUIDE MONITOR SYS: W/DEVICE | 1 days supply | Qty: 1 | Fill #0

## 2018-11-09 MED FILL — CLOPIDOGREL 75 MG TABLET: 75 | 90 days supply | Qty: 90 | Fill #0

## 2018-11-09 NOTE — Progress Notes (Signed)
Pt. Stated her vaginal is really itchy and little blister is on the side of her vaginal.   Pt. Stated she been having this itchy vaginal since covid19.

## 2018-11-09 NOTE — Progress Notes (Signed)
Sarah Charles, is a 58 y.o. female  GBT:[id number]  [id number]  DOB - August 25, 1960  Subjective:  Chief Complaint and HPI: Sarah Charles is a 58 y.o. female here today for vaginal itching for several months.  Hasn't been checking blood sugars.  Some polyuria/polydipsia.  Out of all meds for about 3 months.    ROS:   Constitutional:  No f/c, No night sweats, No unexplained weight loss. EENT:  No vision changes, No blurry vision, No hearing changes. No mouth, throat, or ear problems.  Respiratory: No cough, No SOB Cardiac: No CP, no palpitations GI:  No abd pain, No N/V/D. GU: No Urinary s/sx Musculoskeletal: No joint pain Neuro: No headache, no dizziness, no motor weakness.  Skin: No rash Endocrine:  some polydipsia. some polyuria.  Psych: Denies SI/HI  No problems updated.  ALLERGIES: Allergies  Allergen Reactions   Cymbalta [Duloxetine Hcl] Nausea Only    Nausea, lack of therapeutic effect    PAST MEDICAL HISTORY: Past Medical History:  Diagnosis Date   Arthritis    Coronary artery calcification seen on CT scan    a. 07/2017 noted on CT abd.   Depression    GERD (gastroesophageal reflux disease)    Hyperlipidemia    Hypertension    Obesity    Sleep apnea    a. Does not use CPAP "cause i dont think I need it anymore".   Tobacco abuse    Type II diabetes mellitus ([place])     MEDICATIONS AT HOME: Prior to Admission medications   Medication Sig Start Date End Date Taking? Authorizing Provider  aspirin EC 81 MG tablet Take 81 mg by mouth daily.   Yes [provider]  atorvastatin (LIPITOR) 80 MG tablet Take 1 tablet (80 mg total) by mouth daily. 07/04/18  Yes Dorothy Spark, MD  Blood Glucose Monitoring Suppl (ACCU-CHEK AVIVA PLUS) w/Device KIT 1 each by Does not apply route 3 (three) times daily. 06/02/17  Yes Hairston, Mandesia R, FNP  buPROPion (WELLBUTRIN SR) 150 MG 12 hr tablet TAKE 1 TABLET BY MOUTH 2 TIMES DAILY. 05/27/18  Yes Newlin, Charlane Ferretti,  MD  cetirizine (ZYRTEC) 10 MG tablet TAKE 1 TABLET BY MOUTH DAILY. 04/27/18  Yes Charlott Rakes, MD  clopidogrel (PLAVIX) 75 MG tablet Take 1 tablet (75 mg total) by mouth daily. 11/04/17  Yes Duke, Tami Lin, PA  dicyclomine (BENTYL) 10 MG capsule Take 1 capsule by mouth daily. 05/26/18  Yes [provider]  fenofibrate (TRICOR) 145 MG tablet Take 1 tablet (145 mg total) by mouth daily. 11/09/18  Yes Amman Bartel, Dionne Bucy, PA-C  gabapentin (NEURONTIN) 300 MG capsule TAKE 1 CAPSULE (300 MG TOTAL) BY MOUTH 2 (TWO) TIMES DAILY. 04/28/18  Yes Charlott Rakes, MD  glucose blood (ACCU-CHEK AVIVA) test strip Use as instructed 06/02/17  Yes Hairston, Mandesia R, FNP  Insulin Glargine (LANTUS SOLOSTAR) 100 UNIT/ML Solostar Pen Inject 45 Units into the skin at bedtime. 11/09/18  Yes Kaiea Esselman M, PA-C  Insulin Pen Needle (PEN NEEDLES) 31G X 5 MM MISC 35 Units by Does not apply route daily. 08/26/17  Yes Charlott Rakes, MD  Lancet Devices [facility]) lancets Use as instructed 06/02/17  Yes Hairston, Mandesia R, FNP  lisinopril (ZESTRIL) 20 MG tablet Take 1 tablet (20 mg total) by mouth daily. 11/09/18  Yes Kairen Hallinan, Dionne Bucy, PA-C  methocarbamol (ROBAXIN) 500 MG tablet TAKE 1 TABLET BY MOUTH 3 TIMES DAILY. 05/27/18  Yes Charlott Rakes, MD  metoCLOPramide (REGLAN) 10 MG tablet  TAKE 1 TABLET BY MOUTH EVERY EIGHT HOURS AS NEEDED FOR NAUSEA OR VOMITING. 04/28/18  Yes Charlott Rakes, MD  Multiple Vitamin (MULTIVITAMIN) tablet Take 1 tablet by mouth daily.   Yes [provider]  nitroGLYCERIN (NITROSTAT) 0.4 MG SL tablet Place 0.4 mg under the tongue every 5 (five) minutes as needed for chest pain.   Yes [provider]  pantoprazole (PROTONIX) 40 MG tablet Take 1 tablet (40 mg total) by mouth daily. 11/09/18  Yes Freeman Caldron M, PA-C  sitaGLIPtin-metformin (JANUMET) 50-1000 MG tablet Take 1 tablet by mouth 2 (two) times daily with a meal. 11/09/18  Yes Caffie Sotto M, PA-C    traZODone (DESYREL) 50 MG tablet Take 1 tablet (50 mg total) by mouth at bedtime as needed for sleep. 11/09/18  Yes [name], PA-C  TRUEPLUS LANCETS 28G MISC Use as directed 05/30/17  Yes Hairston, Maylon Peppers, FNP  vitamin B-12 (CYANOCOBALAMIN) 500 MCG tablet Take 500 mcg by mouth daily.   Yes [provider]  vitamin C (ASCORBIC ACID) 500 MG tablet Take 500 mg by mouth daily.   Yes [provider]  amLODipine (NORVASC) 2.5 MG tablet Take 1 tablet (2.5 mg total) by mouth daily. 03/23/18 06/21/18  Dorothy Spark, MD  fluconazole (DIFLUCAN) 150 MG tablet Take 1 now and repeat dose in 1 week 11/09/18   [name], PA-C  metoprolol tartrate (LOPRESSOR) 25 MG tablet Take 1 tablet (25 mg total) by mouth 2 (two) times daily. 11/22/17 03/23/18  Daune Perch, NP     Objective:  EXAM:   Vitals:   11/09/18 1104  BP: 111/70  Pulse: 88  Resp: 16  Temp: 97.8 F (36.6 C)  TempSrc: Oral  SpO2: 97%  Weight: 160 lb 6.4 oz (72.8 kg)    General appearance : A&OX3. NAD. Non-toxic-appearing HEENT: Atraumatic and Normocephalic.  PERRLA. EOM intact.  Chest/Lungs:  Breathing-non-labored, Good air entry bilaterally, breath sounds normal without rales, rhonchi, or wheezing  CVS: S1 S2 regular, no murmurs, gallops, rubs  Neurology:  CN II-XII grossly intact, Non focal.   Psych:  TP linear. J/I WNL. Normal speech. Appropriate eye contact and affect.  Skin:  No Rash  Data Review Lab Results  Component Value Date   HGBA1C 10.0 (A) 03/15/2018   HGBA1C 8.6 (H) 10/29/2017   HGBA1C 10.8 05/30/2017     Assessment & Plan   1. Vaginal itching Will treat/cover for yeast given poorly controlled DM - POCT URINALYSIS DIP (CLINITEK) - fluconazole (DIFLUCAN) 150 MG tablet; Take 1 now and repeat dose in 1 week  Dispense: 2 tablet; Refill: 0 -urine culture added  2. Uncontrolled type 2 diabetes mellitus with complication, with long-term current use of insulin (HCC) A1C=13.4  today Uncontrolled-out of meds X 3 months or more.  Resume medication. 1L IVF today and 30 units Novolog. Blood sugar came down to 422 in office.   Check blood sugars tid.  Follow up in 3 weeks. - Glucose (CBG) - HgB A1c - CBC with Differential/Platelet - Comprehensive metabolic panel - Insulin Glargine (LANTUS SOLOSTAR) 100 UNIT/ML Solostar Pen; Inject 45 Units into the skin at bedtime.  Dispense: 15 mL; Refill: 2 - lisinopril (ZESTRIL) 20 MG tablet; Take 1 tablet (20 mg total) by mouth daily.  Dispense: 30 tablet; Refill: 6 - sitaGLIPtin-metformin (JANUMET) 50-1000 MG tablet; Take 1 tablet by mouth 2 (two) times daily with a meal.  Dispense: 180 tablet; Refill: 1 - insulin aspart (novoLOG) injection 30 Units  3. Essential hypertension Controlled-continue current regmine - CBC with Differential/Platelet - Comprehensive metabolic panel - lisinopril (ZESTRIL) 20 MG tablet; Take 1 tablet (20 mg total) by mouth daily.  Dispense: 30 tablet; Refill: 6  4. Mixed dyslipidemia - Lipid panel  5. Gastroesophageal reflux disease without esophagitis - pantoprazole (PROTONIX) 40 MG tablet; Take 1 tablet (40 mg total) by mouth daily.  Dispense: 90 tablet; Refill: 3   Patient have been counseled extensively about nutrition and exercise  Return in about 3 weeks (around 11/30/2018) for [facility] for diabetes.  The patient was given clear instructions to go to ER or return to medical center if symptoms don't improve, worsen or new problems develop. The patient verbalized understanding. The patient was told to call to get lab results if they haven't heard anything in the next week.     Freeman Caldron, PA-C [facility] [place]   11/09/2018, 11:30 AMPatient ID: Sarah Charles, female   DOB: Apr 23, 1961, 59 y.o.   MRN: [id number]

## 2018-11-09 NOTE — Patient Instructions (Signed)
Check your blood sugars 3 times daily.  Drink plenty of water.   Insulin Treatment for Diabetes Mellitus Diabetes (diabetes mellitus) is a long-term (chronic) disease. It occurs when the body does not properly use sugar (glucose) that is released from food after digestion. Glucose levels are controlled by a hormone called insulin. Insulin is made in the pancreas, which is an organ behind the stomach.  If you have type 1 diabetes, you must take insulin because your pancreas does not make any.  If you have type 2 diabetes, you might need to take insulin along with other medicines. In type 2 diabetes, one or both of these problems may be present: ? The pancreas does not make enough insulin. ? Cells in the body do not respond properly to insulin that the body makes (insulin resistance). You must use insulin correctly to control your diabetes. You must have some insulin in your body at all times. Insulin treatment varies depending on your type of diabetes, your treatment goals, and your medical history. Ask questions to understand your insulin treatment plan so you can be an active partner in managing your diabetes. How is insulin given? Insulin can only be given through a shot (injection). It is injected using a syringe and needle, an insulin pen, a pump, or a jet injector. Your health care provider will:  Prescribe the type and amount of insulin that you need.  Tell you when you should inject your insulin. Where on the body should insulin be injected? Insulin is injected into a layer of fatty tissue under the skin. Good places to inject insulin include:  Abdomen. Generally, the abdomen is the best place to inject insulin. However, you should avoid any area that is less than 2 inches (5 cm) from the belly button (navel).  Front of thigh.  Upper, outer side of thigh.  Upper, outer side of arm.  Upper, outer part of buttock. It is important to:  Give your injection in a slightly different  place each time. This helps to prevent irritation and improve absorption.  Avoid injecting into areas that have scar tissue. Usually, you will give yourself insulin injections. Others can also be taught how to give you injections. You will use a special type of syringe that is made only for insulin. Some people may have an insulin pump that delivers insulin steadily through a tube (cannula) that is placed under the skin. What are the different types of insulin? The following information is a general guide to different types of insulin. Specifics vary depending on the insulin product that your health care provider prescribes.  Rapid-acting insulin: ? Starts working quickly, in as little as 5 minutes. ? Can last for 4-6 hours (or sometimes longer). ? Works well when taken right before a meal to quickly lower your blood glucose.  Short-acting insulin: ? Starts working in about 30 minutes. ? Can last for 6-10 hours. ? Should be taken about 30 minutes before you start eating a meal.  Intermediate-acting insulin: ? Starts working in 1-2 hours. ? Lasts for about 10-18 hours. ? Lowers your blood glucose for a longer period of time, but it is not as effective for lowering blood glucose right after a meal.  Long-acting insulin: ? Mimics the small amount of insulin that your pancreas usually produces throughout the day. ? Should be used one or two times a day. ? Is usually used in combination with other types of insulin or other medicines.  Concentrated insulin, or U-500 insulin: ?  Contains a higher dose of insulin than most rapid-acting insulins. U-500 insulin has 5 times the amount of insulin per 1 mL. ? Should only be used with the special U-500 syringe or U-500 insulin pen. It is dangerous to use the wrong type of syringe with this insulin. What are the side effects of insulin? Possible side effects of insulin treatment include:  Low blood glucose (hypoglycemia).  Weight gain.  High  blood glucose (hyperglycemia).  Skin injury or irritation. Some of these side effects can be caused by using improper injection technique. Be sure to learn how to inject insulin properly. What are common terms associated with insulin treatment? Some terms that you might hear include:  Basal insulin, or basal rate. This is the constant amount of insulin that needs to be present in your body to keep your blood glucose levels stable. People who have type 1 diabetes need basal insulin in a steady (continuous) dose 24 hours a day. ? Usually, intermediate-acting or long-acting insulin is used one or two times a day to manage basal insulin levels. ? Medicines that are taken by mouth may also be recommended to manage basal insulin levels.  Prandial or nutrition insulin. This refers to meal-related insulin. ? Blood glucose rises quickly after a meal (postprandial). Rapid-acting or short-acting insulin can be used right before a meal (preprandial) to quickly lower your blood glucose. ? You may be instructed to adjust the amount of prandial insulin that you take, based on how much carbohydrate (starch) is in your meal.  Corrective insulin. This may also be called a correction dose or supplemental dose. This is a small amount of rapid-acting or short-acting insulin that can be used to lower your blood glucose if it is too high. You may be instructed to check your blood glucose at certain times of the day and use corrective insulin as needed.  Tight control, or intensive therapy. This means keeping your blood glucose as close to your target as possible, and preventing your blood glucose from getting too high after meals. People who have tight control of their diabetes have fewer long-term problems caused by diabetes. Follow these instructions at home: Talk with your health care provider or pharmacist about the type of insulin you should take and when you should take it. You should know when your insulin goes  up the most (peaks) and when it wears off. You need this information so you can plan your meals and exercise. Work with your health care provider to:  Check your blood glucose every day. Your health care provider will tell you how often and when you should do this.  Manage your: ? Weight. ? Blood pressure. ? Cholesterol. ? Stress.  Eat a healthy diet.  Exercise regularly. Summary  Diabetes is a long-term (chronic) disease. It occurs when the body does not properly use sugar (glucose) that is released from food after digestion. Glucose levels are controlled by a hormone called insulin, which is made in an organ behind your stomach (pancreas).  You must use insulin correctly to control your diabetes. You must have some insulin in your body at all times.  Insulin treatment varies depending on your type of diabetes, your treatment goals, and your medical history.  Talk with your health care provider or pharmacist about the type of insulin you should take and when you should take it.  Check your blood glucose every day. Your health care provider will tell you how often and when you should check it. This  information is not intended to replace advice given to you by your health care provider. Make sure you discuss any questions you have with your health care provider. Document Released: 08/06/2008 Document Revised: 12/01/2016 Document Reviewed: 06/13/2015 Elsevier Interactive Patient Education  2019 [employer].

## 2018-11-11 LAB — URINE CULTURE

## 2018-11-13 ENCOUNTER — Other Ambulatory Visit: Payer: Self-pay | Admitting: Physician Assistant

## 2018-11-13 DIAGNOSIS — N898 Other specified noninflammatory disorders of vagina: Secondary | ICD-10-CM

## 2018-11-13 LAB — CBC WITH DIFFERENTIAL/PLATELET
Basophils Absolute: 0.1 10*3/uL (ref 0.0–0.2)
Basos: 1 %
EOS (ABSOLUTE): 0.1 10*3/uL (ref 0.0–0.4)
Eos: 2 %
Hematocrit: 42.8 % (ref 34.0–46.6)
Hemoglobin: 14.2 g/dL (ref 11.1–15.9)
Immature Grans (Abs): 0 10*3/uL (ref 0.0–0.1)
Immature Granulocytes: 0 %
Lymphocytes Absolute: 2 10*3/uL (ref 0.7–3.1)
Lymphs: 31 %
MCH: 30.9 pg (ref 26.6–33.0)
MCHC: 33.2 g/dL (ref 31.5–35.7)
MCV: 93 fL (ref 79–97)
Monocytes Absolute: 0.4 10*3/uL (ref 0.1–0.9)
Monocytes: 7 %
Neutrophils Absolute: 3.8 10*3/uL (ref 1.4–7.0)
Neutrophils: 59 %
Platelets: 321 10*3/uL (ref 150–450)
RBC: 4.59 x10E6/uL (ref 3.77–5.28)
RDW: 12.5 % (ref 11.7–15.4)
WBC: 6.5 10*3/uL (ref 3.4–10.8)

## 2018-11-13 LAB — COMPREHENSIVE METABOLIC PANEL
ALT: 12 IU/L (ref 0–32)
AST: 17 IU/L (ref 0–40)
Albumin/Globulin Ratio: 1.5 (ref 1.2–2.2)
Albumin: 4 g/dL (ref 3.8–4.9)
Alkaline Phosphatase: 144 IU/L — ABNORMAL HIGH (ref 39–117)
BUN/Creatinine Ratio: 26 — ABNORMAL HIGH (ref 9–23)
BUN: 18 mg/dL (ref 6–24)
Bilirubin Total: 0.2 mg/dL (ref 0.0–1.2)
CO2: 20 mmol/L (ref 20–29)
Calcium: 9.6 mg/dL (ref 8.7–10.2)
Chloride: 98 mmol/L (ref 96–106)
Creatinine, Ser: 0.68 mg/dL (ref 0.57–1.00)
GFR calc Af Amer: 112 mL/min/{1.73_m2} (ref >59)
GFR calc non Af Amer: 97 mL/min/{1.73_m2} (ref >59)
Globulin, Total: 2.6 g/dL (ref 1.5–4.5)
Glucose: 552 mg/dL (ref 65–99)
Potassium: 4.6 mmol/L (ref 3.5–5.2)
Sodium: 136 mmol/L (ref 134–144)
Total Protein: 6.6 g/dL (ref 6.0–8.5)

## 2018-11-13 LAB — LIPID PANEL
Chol/HDL Ratio: 5.3 ratio — ABNORMAL HIGH (ref 0.0–4.4)
Cholesterol, Total: 184 mg/dL (ref 100–199)
HDL: 35 mg/dL — ABNORMAL LOW (ref >39)
Triglycerides: 469 mg/dL — ABNORMAL HIGH (ref 0–149)

## 2018-11-13 MED ORDER — AMOXICILLIN 500 MG PO CAPS: 1000.0000 mg | ORAL_CAPSULE | Freq: Two times a day (BID) | ORAL | 0 refills | Status: DC

## 2018-11-13 MED ORDER — FLUCONAZOLE 150 MG PO TABS: 150.0000 mg | ORAL_TABLET | Freq: Once | ORAL | 0 refills | Status: AC

## 2018-11-14 ENCOUNTER — Ambulatory Visit: Payer: Medicaid Other | Admitting: Cardiology

## 2018-11-14 ENCOUNTER — Encounter: Payer: Self-pay | Admitting: Cardiology

## 2018-11-14 ENCOUNTER — Other Ambulatory Visit: Payer: Self-pay

## 2018-11-14 VITALS — BP 134/72 | HR 79 | Ht 63.0 in | Wt 162.0 lb

## 2018-11-14 DIAGNOSIS — E785 Hyperlipidemia, unspecified: Secondary | ICD-10-CM

## 2018-11-14 DIAGNOSIS — I2510 Atherosclerotic heart disease of native coronary artery without angina pectoris: Secondary | ICD-10-CM

## 2018-11-14 MED ORDER — ICOSAPENT ETHYL 1 G PO CAPS: 1.0000 g | ORAL_CAPSULE | Freq: Two times a day (BID) | ORAL | 3 refills | Status: DC

## 2018-11-14 MED FILL — FLUCONAZOLE 150 MG TABS: 150 | 1 days supply | Qty: 1 | Fill #0

## 2018-11-14 MED FILL — AMOXICILLIN 500 MG CAPSULE: 500 | 10 days supply | Qty: 40 | Fill #0

## 2018-11-14 NOTE — Patient Instructions (Signed)
Medication Instructions:  START VASCEPA (Icosapent Ethyl) 1 gram TWICE per DAY If you need a refill on your cardiac medications before your next appointment, please call your pharmacy.   Lab work: NONE If you have labs (blood work) drawn today and your tests are completely normal, you will receive your results only by: Marland Kitchen MyChart Message (if you have MyChart) OR . A paper copy in the mail If you have any lab test that is abnormal or we need to change your treatment, we will call you to review the results.  Testing/Procedures: NONE  Follow-Up: At [facility], you and your health needs are our priority.  As part of our continuing mission to provide you with exceptional heart care, we have created designated Provider Care Teams.  These Care Teams include your primary Cardiologist (physician) and Advanced Practice Providers (APPs -  Physician Assistants and Nurse Practitioners) who all work together to provide you with the care you need, when you need it. You will need a follow up appointment in 6 months.  Please call our office 2 months in advance to schedule this appointment.  You may see Ena Dawley, MD or one of the following Advanced Practice Providers on your designated Care Team:   [place], PA-C Melina Copa, PA-C . Ermalinda Barrios, PA-C  Any Other Special Instructions Will Be Listed Below (If Applicable).

## 2018-11-14 NOTE — Progress Notes (Signed)
11/14/2018 [name]   02/23/1961  [id number]  Primary Physician Charlott Rakes, MD Primary Cardiologist: Ena Dawley, MD  Electrophysiologist: None   Reason for Visit/CC: Routien f/u for CAD  HPI:  Sarah Charles is a 58 y.o. female, followed by Dr. Meda Coffee, with h/o CAD, T2DM, HLD obesity, tobacco abuse, depression and GERD.   She presented to the [facility] ED 10/29/2017 for evaluation of chest pressure with diaphoresis, nausea and shortness of breath.  She has a strong family history significant for CAD with her father dying at age 5 of MI.  She ruled out for MI and was transferred to [facility] where she underwent cardiac CTA that showed significant mid LAD stenosis by FFR.  She was discharged from the hospital and came in later for outpatient cardiac catheterization on 11/04/2017 which revealed 75% stenosis of the proximal LAD that was treated with a drug-eluting stent and placed on DAPT and high dose statin.   She had f/u with Dr. Meda Coffee 06/28/18 and denied CP and dyspnea but reported  leg pain not related to exertion or walking for about the month. Dr. Meda Coffee felt that this could have been related to her statin. Her LDL at that time was 59. Dr. Meda Coffee instructed her to hold her Lipitor x 1 week to see if any improvement. Unfortunately, she continued to have leg pain despite stopping, it thus was not felt to be statin related. She was advised to restart it and to f/u again with an APP in 4 months.   Today in f/u, pt report that she has done well from a cardiac standpoint. No CP or dyspnea. DM however is poorly controlled. Last Hgb A1c was 13. At the start of the pandemic, she was w/o all of her meds x 1 week has been fully compliant since getting refills. She has been under a lot of recent stress. Her husband is currently in the hospital for recent MI. This is his 2nd in the past year. She has successfully quit smoking and has lost 13 lb since her last OV, down from 175 lb to 162 bl. Her leg  pain got better w/ gabapentin, likely diabetic neuropathy.   Cardiac Studies   LHC 11/05/18  Prox LAD lesion is 70% stenosed.  Prox Cx to Mid Cx lesion is 30% stenosed.  Prox RCA to Mid RCA lesion is 20% stenosed.  Post intervention, there is a 0% residual stenosis.  A drug-eluting stent was successfully placed using a STENT SIERRA 2.50 X 15 MM.  LV end diastolic pressure is normal.   1. Single vessel obstructive CAD.    - 70% mid LAD immediately after the first diagonal. FFR 0.78 2. Normal LVEDP 3. Successful PCI of the mid LAD with DES  Plan: DAPT for at least 6 months. She is a candidate for same day discharge. Will hold metformin for 48 hours. Will need to switch Prilosec to Protonix.   2D Echo 10/2017  Study Conclusions  - Left ventricle: The cavity size was normal. Systolic function was   normal. The estimated ejection fraction was in the range of 55%   to 60%.  Current Meds  Medication Sig   amLODipine (NORVASC) 2.5 MG tablet Take 1 tablet (2.5 mg total) by mouth daily.   aspirin EC 81 MG tablet Take 81 mg by mouth daily.   atorvastatin (LIPITOR) 80 MG tablet Take 1 tablet (80 mg total) by mouth daily.   Blood Glucose Monitoring Suppl (ACCU-CHEK GUIDE ME) w/Device KIT  1 each by Does not apply route 3 (three) times daily.   buPROPion (WELLBUTRIN SR) 150 MG 12 hr tablet Take 1 tablet (150 mg total) by mouth 2 (two) times daily.   cetirizine (ZYRTEC) 10 MG tablet Take 1 tablet (10 mg total) by mouth daily.   clopidogrel (PLAVIX) 75 MG tablet Take 1 tablet (75 mg total) by mouth daily.   dicyclomine (BENTYL) 10 MG capsule Take 1 capsule by mouth daily.   fenofibrate (TRICOR) 145 MG tablet Take 1 tablet (145 mg total) by mouth daily.   gabapentin (NEURONTIN) 300 MG capsule Take 1 capsule (300 mg total) by mouth 2 (two) times daily.   glucose blood (ACCU-CHEK GUIDE) test strip Use as instructed   Insulin Glargine (LANTUS SOLOSTAR) 100 UNIT/ML Solostar Pen  Inject 45 Units into the skin at bedtime.   Insulin Pen Needle (PEN NEEDLES) 31G X 5 MM MISC 35 Units by Does not apply route daily.   Lancet Devices (ACCU-CHEK SOFTCLIX) lancets Use as instructed   Lancets Misc. (ACCU-CHEK FASTCLIX LANCET) KIT As directed   lisinopril (ZESTRIL) 20 MG tablet Take 1 tablet (20 mg total) by mouth daily.   methocarbamol (ROBAXIN) 500 MG tablet TAKE 1 TABLET BY MOUTH 3 TIMES DAILY.   metoCLOPramide (REGLAN) 10 MG tablet TAKE 1 TABLET BY MOUTH EVERY EIGHT HOURS AS NEEDED FOR NAUSEA OR VOMITING.   metoprolol tartrate (LOPRESSOR) 25 MG tablet Take 1 tablet (25 mg total) by mouth 2 (two) times daily for 30 days.   Multiple Vitamin (MULTIVITAMIN) tablet Take 1 tablet by mouth daily.   nitroGLYCERIN (NITROSTAT) 0.4 MG SL tablet Place 0.4 mg under the tongue every 5 (five) minutes as needed for chest pain.   pantoprazole (PROTONIX) 40 MG tablet Take 1 tablet (40 mg total) by mouth daily.   sitaGLIPtin-metformin (JANUMET) 50-1000 MG tablet Take 1 tablet by mouth 2 (two) times daily with a meal.   traZODone (DESYREL) 50 MG tablet Take 1 tablet (50 mg total) by mouth at bedtime as needed for sleep.   vitamin B-12 (CYANOCOBALAMIN) 500 MCG tablet Take 500 mcg by mouth daily.   vitamin C (ASCORBIC ACID) 500 MG tablet Take 500 mg by mouth daily.   Allergies  Allergen Reactions   Cymbalta [Duloxetine Hcl] Nausea Only    Nausea, lack of therapeutic effect   Past Medical History:  Diagnosis Date   Arthritis    Coronary artery calcification seen on CT scan    a. 07/2017 noted on CT abd.   Depression    GERD (gastroesophageal reflux disease)    Hyperlipidemia    Hypertension    Obesity    Sleep apnea    a. Does not use CPAP "cause i dont think I need it anymore".   Tobacco abuse    Type II diabetes mellitus ([place])    Family History  Problem Relation Age of Onset   Hypertension Mother    CVA Mother    COPD Mother    Heart disease Mother      Kidney Stones Mother    Heart attack Mother        MI in late 73's   Breast cancer Mother    CAD Father    Hypercholesterolemia Father    Heart attack Father        Died @ 40 of MI   Cancer Maternal Uncle    Diabetes Maternal Grandmother    Cancer Maternal Grandfather        lung cancer  Diabetes Sister    Past Surgical History:  Procedure Laterality Date   CHOLECYSTECTOMY     CORONARY STENT INTERVENTION N/A 11/04/2017   Procedure: CORONARY STENT INTERVENTION;  Surgeon: Martinique, Peter M, MD;  Location: [place] [facility];  Service: Cardiovascular;  Laterality: N/A;   INCISION AND DRAINAGE ABSCESS Left 12/22/2012   Procedure: INCISION AND DRAINAGE ABSCESS;  Surgeon: Jamesetta So, MD;  Location: [facility];  Service: General;  Laterality: Left;   INTRAVASCULAR PRESSURE WIRE/FFR STUDY N/A 11/04/2017   Procedure: INTRAVASCULAR PRESSURE WIRE/FFR STUDY;  Surgeon: Martinique, Peter M, MD;  Location: [place] [facility];  Service: Cardiovascular;  Laterality: N/A;   LEFT HEART CATH AND CORONARY ANGIOGRAPHY N/A 11/04/2017   Procedure: LEFT HEART CATH AND CORONARY ANGIOGRAPHY;  Surgeon: Martinique, Peter M, MD;  Location: [place] [facility];  Service: Cardiovascular;  Laterality: N/A;   TONSILLECTOMY     TUBAL LIGATION     Social History   Socioeconomic History   Marital status: Married    Spouse name: Not on file   Number of children: Not on file   Years of education: Not on file   Highest education level: Not on file  Occupational History   Not on file  Social Needs   Financial resource strain: Not on file   Food insecurity    Worry: Not on file    Inability: Not on file   Transportation needs    Medical: Not on file    Non-medical: Not on file  Tobacco Use   Smoking status: Former Smoker    Packs/day: 1.00    Years: 41.00    Pack years: 41.00    Types: Cigarettes    Quit date: 10/07/2018    Years since quitting: 0.1   Smokeless tobacco: Never Used   Substance and Sexual Activity   Alcohol use: No   Drug use: No   Sexual activity: Never    Birth control/protection: None  Lifestyle   Physical activity    Days per week: Not on file    Minutes per session: Not on file   Stress: Not on file  Relationships   Social connections    Talks on phone: Not on file    Gets together: Not on file    Attends religious service: Not on file    Active member of club or organization: Not on file    Attends meetings of clubs or organizations: Not on file    Relationship status: Not on file   Intimate partner violence    Fear of current or ex partner: Not on file    Emotionally abused: Not on file    Physically abused: Not on file    Forced sexual activity: Not on file  Other Topics Concern   Not on file  Social History Narrative   Lives in [place] with husband.  Unemployed.  Does not routinely exercise.     Lipid Panel     Component Value Date/Time   CHOL 184 11/09/2018 1158   TRIG 469 (H) 11/09/2018 1158   HDL 35 (L) 11/09/2018 1158   CHOLHDL 5.3 (H) 11/09/2018 1158   CHOLHDL 9.7 (H) 07/23/2016 1638   VLDL NOT CALC 07/23/2016 1638   [place] Comment 11/09/2018 1158    Review of Systems: General: negative for chills, fever, night sweats or weight changes.  Cardiovascular: negative for chest pain, dyspnea on exertion, edema, orthopnea, palpitations, paroxysmal nocturnal dyspnea or shortness of breath Dermatological: negative for rash Respiratory: negative  for cough or wheezing Urologic: negative for hematuria Abdominal: negative for nausea, vomiting, diarrhea, bright red blood per rectum, melena, or hematemesis Neurologic: negative for visual changes, syncope, or dizziness All other systems reviewed and are otherwise negative except as noted above.   Physical Exam:  Blood pressure 134/72, pulse 79, height '5\' 3"'  (1.6 m), weight 162 lb (73.5 kg).  General appearance: alert, cooperative and no distress Neck: no  adenopathy, no carotid bruit, no JVD and thyroid not enlarged, symmetric, no tenderness/mass/nodules Lungs: clear to auscultation bilaterally Heart: regular rate and rhythm, S1, S2 normal, no murmur, click, rub or gallop Extremities: extremities normal, atraumatic, no cyanosis or edema Pulses: 2+ and symmetric Skin: Skin color, texture, turgor normal. No rashes or lesions Neurologic: Grossly normal  EKG NSR 79 bpm-- personally reviewed   ASSESSMENT AND PLAN:   1. CAD: cardiac cath in June 2019 showed single vessel obstructive CAD, with 70% mid LAD immediately after the first diagonal. FFR 0.78. Normal LVEDP. She underwent successful PCI of the mid LAD with DES. RCA and LCx with mild disease, 20-30% disease. Stable w/o CP. EKG shows NSR. No ischemia. HR and BP well controlled. Continue ASA, Plavix, statin, BB and ACEi.    2. Tobacco Abuse: pt has quit smoking. She was congratulated on her efforts.   3. HLD: LDL controlled in the 50s but TGs recently were severely elevated at 469. She reports full compliance with fenofibrate and is on the max dose. She has cardiovascular disease and poorly controlled DM. Will stop fenofibrate and will try Vascepa. If too expensive, will refer to lipid clinic.   4. HTN: controlled on current regimen.   5. DM: managed by PCP. Poorly controlled. Recent Hgb A1c was 13. She is on insulin and has close f/u with PCP. She is on an ACEi for renal protection and gets yearly eye exams. Report she does home foot exams. She is on a statin.   Follow-Up w/ Dr. Meda Coffee in 6 months.   Sarah Charles Ladoris Gene, MHS [facility] 11/14/2018 3:59 PM

## 2018-11-15 MED FILL — VASCEPA 1 GM CAPSULE: 1 | 30 days supply | Qty: 60 | Fill #0

## 2018-11-16 ENCOUNTER — Telehealth: Payer: Self-pay

## 2018-11-16 DIAGNOSIS — E785 Hyperlipidemia, unspecified: Secondary | ICD-10-CM

## 2018-11-16 NOTE — Telephone Encounter (Signed)
Orders

## 2018-11-27 ENCOUNTER — Telehealth: Payer: Self-pay | Admitting: Family Medicine

## 2018-11-27 NOTE — Telephone Encounter (Signed)
We can have a tele or virtual visit. She had an in person visit last month.

## 2018-11-27 NOTE — Telephone Encounter (Signed)
Should we set up a tele or in-person

## 2018-11-27 NOTE — Telephone Encounter (Signed)
Pt called to say blood sugars have run high four days. Pt states meds need adjusted. Return call to pt at this temporary phone number [phone].   [facility].

## 2018-11-27 NOTE — Telephone Encounter (Signed)
Attempt to call both contact numbers on file and either numbers have voicemail set up. Unable to reach patient to inform of  Appointment- Televisit  Please schedule

## 2018-11-28 NOTE — Telephone Encounter (Signed)
Patient was called and there was no answer one any number provided.

## 2018-11-30 ENCOUNTER — Ambulatory Visit: Payer: Medicaid Other | Attending: Family Medicine | Admitting: Family Medicine

## 2018-11-30 ENCOUNTER — Other Ambulatory Visit: Payer: Self-pay

## 2018-11-30 DIAGNOSIS — E1165 Type 2 diabetes mellitus with hyperglycemia: Secondary | ICD-10-CM | POA: Diagnosis not present

## 2018-11-30 DIAGNOSIS — Z794 Long term (current) use of insulin: Secondary | ICD-10-CM | POA: Diagnosis not present

## 2018-11-30 DIAGNOSIS — IMO0002 Reserved for concepts with insufficient information to code with codable children: Secondary | ICD-10-CM

## 2018-11-30 MED ORDER — PEN NEEDLES 31G X 5 MM MISC: 1.0000 | Freq: Every day | 11 refills | Status: DC

## 2018-11-30 MED ORDER — LANTUS SOLOSTAR 100 UNIT/ML ~~LOC~~ SOPN: 45.0000 [IU] | PEN_INJECTOR | Freq: Every day | SUBCUTANEOUS | 2 refills | Status: DC

## 2018-11-30 MED FILL — LANTUS SOLOSTAR 100 UNITS/M: 100 | 30 days supply | Qty: 15 | Fill #0

## 2018-11-30 NOTE — Progress Notes (Signed)
Virtual Visit via Telephone Note  I connected with Sarah Charles, on 11/30/2018 at 10:39 AM by telephone due to the COVID-19 pandemic and verified that I am speaking with the correct person using two identifiers.   Consent: I discussed the limitations, risks, security and privacy concerns of performing an evaluation and management service by telephone and the availability of in person appointments. I also discussed with the patient that there may be a patient responsible charge related to this service. The patient expressed understanding and agreed to proceed.   Location of Patient: Home  Location of Provider: Clinic   Persons participating in Telemedicine visit: Sarah Charles Dr. Felecia Shelling     History of Present Illness: Sarah Charles  is a 58 year old female with a history of type 2 diabetes mellitus (A1c 13.4), hypertension, hyperlipidemia, GERD who presents today for an acute visit complaining of elevated blood sugars.  She complains that sugars have been elevated with a fasting sugar at 401 this morning and having random sugars range between 375 and 400.  She has been administering 35 units of Lantus rather than 45 units prescribed which appears on her med list.  She also endorses drinking lots of sodas. Denies abdominal pain, blurry vision but does have some polyuria.  Past Medical History:  Diagnosis Date  . Arthritis   . Coronary artery calcification seen on CT scan    a. 07/2017 noted on CT abd.  . Depression   . GERD (gastroesophageal reflux disease)   . Hyperlipidemia   . Hypertension   . Obesity   . Sleep apnea    a. Does not use CPAP "cause i dont think I need it anymore".  . Tobacco abuse   . Type II diabetes mellitus (HCC)    Allergies  Allergen Reactions  . Cymbalta [Duloxetine Hcl] Nausea Only    Nausea, lack of therapeutic effect    Current Outpatient Medications on File Prior to Visit  Medication Sig Dispense Refill  . amLODipine  (NORVASC) 2.5 MG tablet Take 1 tablet (2.5 mg total) by mouth daily. 180 tablet 3  . aspirin EC 81 MG tablet Take 81 mg by mouth daily.    Marland Kitchen atorvastatin (LIPITOR) 80 MG tablet Take 1 tablet (80 mg total) by mouth daily. 90 tablet 3  . Blood Glucose Monitoring Suppl (ACCU-CHEK GUIDE ME) w/Device KIT 1 each by Does not apply route 3 (three) times daily. 1 kit 0  . buPROPion (WELLBUTRIN SR) 150 MG 12 hr tablet Take 1 tablet (150 mg total) by mouth 2 (two) times daily. 60 tablet 2  . cetirizine (ZYRTEC) 10 MG tablet Take 1 tablet (10 mg total) by mouth daily. 30 tablet 2  . clopidogrel (PLAVIX) 75 MG tablet Take 1 tablet (75 mg total) by mouth daily. 90 tablet 2  . dicyclomine (BENTYL) 10 MG capsule Take 1 capsule by mouth daily.    . fenofibrate (TRICOR) 145 MG tablet Take 1 tablet (145 mg total) by mouth daily. 30 tablet 3  . gabapentin (NEURONTIN) 300 MG capsule Take 1 capsule (300 mg total) by mouth 2 (two) times daily. 60 capsule 2  . glucose blood (ACCU-CHEK GUIDE) test strip Use as instructed 100 each 12  . Icosapent Ethyl 1 g CAPS Take 1 capsule (1 g total) by mouth 2 (two) times daily. 180 capsule 3  . Insulin Glargine (LANTUS SOLOSTAR) 100 UNIT/ML Solostar Pen Inject 45 Units into the skin at bedtime. 15 mL 2  . Insulin Pen Needle (PEN  NEEDLES) 31G X 5 MM MISC 35 Units by Does not apply route daily. 100 each 11  . Lancet Devices (ACCU-CHEK SOFTCLIX) lancets Use as instructed 1 each 12  . Lancets Misc. (ACCU-CHEK FASTCLIX LANCET) KIT As directed 1 kit 0  . lisinopril (ZESTRIL) 20 MG tablet Take 1 tablet (20 mg total) by mouth daily. 30 tablet 6  . methocarbamol (ROBAXIN) 500 MG tablet TAKE 1 TABLET BY MOUTH 3 TIMES DAILY. 90 tablet 0  . metoCLOPramide (REGLAN) 10 MG tablet TAKE 1 TABLET BY MOUTH EVERY EIGHT HOURS AS NEEDED FOR NAUSEA OR VOMITING. 40 tablet 0  . metoprolol tartrate (LOPRESSOR) 25 MG tablet Take 1 tablet (25 mg total) by mouth 2 (two) times daily for 30 days. 60 tablet 12  .  Multiple Vitamin (MULTIVITAMIN) tablet Take 1 tablet by mouth daily.    . nitroGLYCERIN (NITROSTAT) 0.4 MG SL tablet Place 0.4 mg under the tongue every 5 (five) minutes as needed for chest pain.    . pantoprazole (PROTONIX) 40 MG tablet Take 1 tablet (40 mg total) by mouth daily. 90 tablet 3  . sitaGLIPtin-metformin (JANUMET) 50-1000 MG tablet Take 1 tablet by mouth 2 (two) times daily with a meal. 180 tablet 1  . traZODone (DESYREL) 50 MG tablet Take 1 tablet (50 mg total) by mouth at bedtime as needed for sleep. 30 tablet 2  . vitamin B-12 (CYANOCOBALAMIN) 500 MCG tablet Take 500 mcg by mouth daily.    . vitamin C (ASCORBIC ACID) 500 MG tablet Take 500 mg by mouth daily.    . fluconazole (DIFLUCAN) 150 MG tablet Take 1 now and repeat dose in 1 week (Patient not taking: Reported on 11/14/2018) 2 tablet 0   No current facility-administered medications on file prior to visit.     Observations/Objective: Awake, alert, oriented x3 Not in acute distress  Lab Results  Component Value Date   HGBA1C 13.4 (A) 11/09/2018    Assessment and Plan: 1. Uncontrolled type 2 diabetes mellitus with complication, with long-term current use of insulin (HCC) Uncontrolled with A1c of 13.4 She has been administering 35 rather than 45 units of Lantus and has been corrected on appropriate dosing Nonadherence to diabetic diet also contributory We will schedule with clinical pharmacist for diabetic teaching At her next visit I will review her blood sugar log and adjust regimen accordingly - Insulin Glargine (LANTUS SOLOSTAR) 100 UNIT/ML Solostar Pen; Inject 45 Units into the skin at bedtime.  Dispense: 15 mL; Refill: 2   Follow Up Instructions: 2 weeks   I discussed the assessment and treatment plan with the patient. The patient was provided an opportunity to ask questions and all were answered. The patient agreed with the plan and demonstrated an understanding of the instructions.   The patient was advised  to call back or seek an in-person evaluation if the symptoms worsen or if the condition fails to improve as anticipated.     I provided  9 minutes total of non-face-to-face time during this encounter including median intraservice time, reviewing previous notes, labs, imaging, medications, management and patient verbalized understanding.     Charlott Rakes, MD, FAAFP. [facility] and [place], [place]   11/30/2018, 10:39 AM

## 2018-11-30 NOTE — Progress Notes (Signed)
Patient has been called and DOB has been verified. Patient has been screened and transferred to PCP to start phone visit.  High Blood sugars for 1 week.

## 2018-12-04 ENCOUNTER — Other Ambulatory Visit: Payer: Self-pay

## 2018-12-04 ENCOUNTER — Ambulatory Visit: Payer: Medicaid Other | Attending: Family Medicine | Admitting: Pharmacist

## 2018-12-04 DIAGNOSIS — E118 Type 2 diabetes mellitus with unspecified complications: Secondary | ICD-10-CM | POA: Diagnosis not present

## 2018-12-04 DIAGNOSIS — E1165 Type 2 diabetes mellitus with hyperglycemia: Secondary | ICD-10-CM | POA: Diagnosis not present

## 2018-12-04 DIAGNOSIS — Z794 Long term (current) use of insulin: Secondary | ICD-10-CM | POA: Diagnosis not present

## 2018-12-04 DIAGNOSIS — IMO0002 Reserved for concepts with insufficient information to code with codable children: Secondary | ICD-10-CM

## 2018-12-04 NOTE — Progress Notes (Signed)
Patient was educated on the use of the Lantus pen. Reviewed necessary supplies and operation of the pen. Also reviewed goal blood glucose levels. Patient was able to demonstrate use. All questions and concerns were addressed.  Patient admits to daily intake of soda and regular fruit juice. Reinforced Myplate, the role of diet and exercise in glycemic control, and other lifestyle measures used to aid in glycemic control. Patient verbalizes understanding.

## 2018-12-04 NOTE — Patient Instructions (Signed)
Insulin Injection Instructions, Using Insulin Pens, Adult A subcutaneous injection is a shot of medicine that is injected into the layer of fat and tissue between skin and muscle. People with type 1 diabetes must take insulin because their bodies do not make it. People with type 2 diabetes may need to take insulin. There are many different types of insulin. The type of insulin that you take may determine how many injections you give yourself and when you need to give the injections. Supplies needed:  Soap and water to wash hands.  Your insulin pen.  A new, unused needle.  Alcohol wipes.  A disposal container that is meant for [employer] items (sharps container), such as an empty plastic bottle with a cover. How to choose a site for injection The body absorbs insulin differently, depending on where the insulin is injected (injection site). It is best to inject insulin into the same body area each time (for example, always in the abdomen), but you should use a different spot in that area for each injection. Do not inject the insulin in the same spot each time. There are five main areas that can be used for injecting. These areas include:  Abdomen. This is the preferred area.  Front of thigh.  Upper, outer side of thigh.  Upper, outer side of arm.  Upper, outer part of buttock. How to use an insulin pen  First, follow the steps for Get ready, then continue with the steps for Inject the insulin. Get ready 1. Wash your hands with soap and water. If soap and water are not available, use hand sanitizer. 2. Before you give yourself an insulin injection, be sure to test your blood sugar level (blood glucose level) and write down that number. Follow any instructions from your health care provider about what to do if your blood glucose level is higher or lower than your normal range. 3. Check the expiration date and the type of insulin that is in the pen. 4. If you are using CLEAR insulin, check to  see that it is clear and free of clumps. 5. If you are using CLOUDY insulin, do not shake the pen to get the injection ready. Instead, get it ready in one of these ways: ? Gently roll the pen between your palms several times. ? Tip the pen up and down several times. 6. Remove the cap from the insulin pen. 7. Use an alcohol wipe to clean the rubber tip of the pen. 8. Remove the protective paper tab from the disposable needle. Do not let the needle touch anything. 9. Screw a new, unused needle onto the pen. 10. Remove the outer plastic needle cover. Do not throw away the outer plastic cover yet. ? If the pen uses a special safety needle, leave the inner needle shield in place. ? If the pen does not use a special safety needle, remove the inner plastic cover from the needle. 11. Follow the manufacturer's instructions to prime the insulin pen with the volume of insulin needed. Hold the pen with the needle pointing up, and push the button on the opposite end of the pen until a drop of insulin appears at the needle tip. If no insulin appears, repeat this step. 12. Turn the button (dial) to the number of units of insulin that you will be injecting. Inject the insulin 1. Use an alcohol wipe to clean the site where you will be injecting the needle. Let the site air-dry. 2. Hold the pen in the   palm of your writing hand like a pencil. 3. If directed by your health care provider, use your other hand to pinch and hold about an inch (2.5 cm) of skin at the injection site. Do not directly touch the cleaned part of the skin. 4. Gently but quickly, use your writing hand to put the needle straight into the skin. The needle should be at a 90-degree angle (perpendicular) to the skin. 5. When the needle is completely inserted into the skin, use your thumb or index finger of your writing hand to push the top button of the pen down all the way to inject the insulin. 6. Let go of the skin that you are pinching. Continue  to hold the pen in place with your writing hand. 7. Wait 10 seconds, then pull the needle straight out of the skin. This will allow all of the insulin to go from the pen and needle into your body. 8. Carefully put the larger (outer) plastic cover of the needle back over the needle, then unscrew the capped needle and discard it in a sharps container, such as an empty plastic bottle with a cover. 9. Put the plastic cap back on the insulin pen. How to throw away supplies  Discard all used needles in a puncture-proof sharps disposal container. You can ask your local pharmacy about where you can get this kind of disposal container, or you can use an empty plastic liquid laundry detergent bottle that has a cover.  Follow the disposal regulations for the area where you live. Do not use any needle more than one time.  Throw away empty disposable pens in the regular trash. Questions to ask your health care provider  How often should I be taking insulin?  How often should I check my blood glucose?  What amount of insulin should I be taking at each time?  What are the side effects?  What should I do if my blood glucose is too high?  What should I do if my blood glucose is too low?  What should I do if I forget to take my insulin?  What number should I call if I have questions? Where to find more information  American Diabetes Association (ADA): www.diabetes.org  American Association of Diabetes Educators (AADE) Patient Resources: https://www.diabeteseducator.org Summary  A subcutaneous injection is a shot of medicine that is injected into the layer of fat and tissue between skin and muscle.  Before you give yourself an insulin injection, be sure to test your blood sugar level (blood glucose level) and write down that number.  Check the expiration date and the type of insulin that is in the pen. The type of insulin that you take may determine how many injections you give yourself and when  you need to give the injections.  It is best to inject insulin into the same body area each time (for example, always in the abdomen), but you should use a different spot in that area for each injection. This information is not intended to replace advice given to you by your health care provider. Make sure you discuss any questions you have with your health care provider. Document Released: 06/13/2015 Document Revised: 05/30/2017 Document Reviewed: 06/13/2015 Elsevier Patient Education  2020 Elsevier Inc.  

## 2018-12-05 ENCOUNTER — Encounter: Payer: Self-pay | Admitting: Pharmacist

## 2018-12-26 MED FILL — CETIRIZINE HCL 10 MG TABS: 10 | 30 days supply | Qty: 30 | Fill #0

## 2018-12-26 MED FILL — LISINOPRIL 20 MG TABLET: 20 | 30 days supply | Qty: 30 | Fill #4

## 2018-12-27 ENCOUNTER — Other Ambulatory Visit: Payer: Self-pay

## 2018-12-27 ENCOUNTER — Ambulatory Visit (INDEPENDENT_AMBULATORY_CARE_PROVIDER_SITE_OTHER): Payer: Medicaid Other | Admitting: Pharmacist

## 2018-12-27 ENCOUNTER — Encounter: Payer: Self-pay | Admitting: Pharmacist

## 2018-12-27 ENCOUNTER — Telehealth: Payer: Self-pay | Admitting: Pharmacist

## 2018-12-27 ENCOUNTER — Encounter (INDEPENDENT_AMBULATORY_CARE_PROVIDER_SITE_OTHER): Payer: Self-pay

## 2018-12-27 DIAGNOSIS — E782 Mixed hyperlipidemia: Secondary | ICD-10-CM | POA: Diagnosis not present

## 2018-12-27 MED ORDER — FENOFIBRATE 145 MG PO TABS: 145.0000 mg | ORAL_TABLET | Freq: Every day | ORAL | 3 refills | Status: DC

## 2018-12-27 MED ORDER — ICOSAPENT ETHYL 1 G PO CAPS: 2.0000 | ORAL_CAPSULE | Freq: Two times a day (BID) | ORAL | 3 refills | Status: DC

## 2018-12-27 MED FILL — FENOFIBRATE 145 MG TAB: 145 | 30 days supply | Qty: 30 | Fill #0

## 2018-12-27 NOTE — Patient Instructions (Addendum)
It was nice to meet you today. The goals from this visit are to:  1. Continue taking atorvastatin 80mg  daily 2. Resume taking fenofibrate 145mg  daily 3. I will call your pharmacy and see what fish oil you are taking. We would ideally like you to be on Vascepa. We can help apply for a grant if this medication is too expensive. 4. Try to eliminate soda and juice from the diet. Try things like flavored water "hint" water or Gatorade zero. 5. Repeat lipid panel in 3 months

## 2018-12-27 NOTE — Progress Notes (Signed)
Patient ID: Sarah Charles                 DOB: 04-26-1961                    MRN: [id number]     HPI: Sarah Charles is a 58 y.o. female patient referred to lipid clinic by Dr. Meda Coffee. PMH is significant for CAD, T2DM, HLD obesity, tobacco abuse, depression and GERD. Patient DM recently has been poorly controlled, with a last A1C of 13.4 on June.   Patient presents today to the lipid clinic. She is not entirely sure what medications she is taking. She says she is taking fenofibrate and fish oil but wasn't sure what the fish oil was called. Vascepa didn't sound familiar to her. I did call [facility] after the visit and confirmed patient did pick up a 30 day supply of Vascepa and fenofibrate on 11/15/2018. Vascepa prescription was only $3 as patient has medicaid.    Current Medications: atorvastatin 75m daily, fenofibrate 1415mdaily, Vascepa 1g BID  Risk Factors: CAD, DM LDL goal: <70  Diet: 2 L of soda per day- Orange juice all the time  Exercise: 30 min per day  Family History:  CAD with her father dying at age [age]f MI.   Social History: quite smoking in the past year  Labs: 11/09/2018: TC 184, TG 469, HDL 35, LDL unable to calculate 06/26/2018: TC 140, TG 203, HDL 40, LDL 59  Past Medical History:  Diagnosis Date  . Arthritis   . Coronary artery calcification seen on CT scan    a. 07/2017 noted on CT abd.  . Depression   . GERD (gastroesophageal reflux disease)   . Hyperlipidemia   . Hypertension   . Obesity   . Sleep apnea    a. Does not use CPAP "cause i dont think I need it anymore".  . Tobacco abuse   [place]    Current Outpatient Medications on File Prior to Visit  Medication Sig Dispense Refill  . amLODipine (NORVASC) 2.5 MG tablet Take 1 tablet (2.5 mg total) by mouth daily. 180 tablet 3  . aspirin EC 81 MG tablet Take 81 mg by mouth daily.    . Marland Kitchentorvastatin (LIPITOR) 80 MG tablet Take 1 tablet (80 mg total) by mouth daily. 90  tablet 3  . Blood Glucose Monitoring Suppl (ACCU-CHEK GUIDE ME) w/Device KIT 1 each by Does not apply route 3 (three) times daily. 1 kit 0  . buPROPion (WELLBUTRIN SR) 150 MG 12 hr tablet Take 1 tablet (150 mg total) by mouth 2 (two) times daily. 60 tablet 2  . cetirizine (ZYRTEC) 10 MG tablet Take 1 tablet (10 mg total) by mouth daily. 30 tablet 2  . clopidogrel (PLAVIX) 75 MG tablet Take 1 tablet (75 mg total) by mouth daily. 90 tablet 2  . dicyclomine (BENTYL) 10 MG capsule Take 1 capsule by mouth daily.    . fenofibrate (TRICOR) 145 MG tablet Take 1 tablet (145 mg total) by mouth daily. 30 tablet 3  . fluconazole (DIFLUCAN) 150 MG tablet Take 1 now and repeat dose in 1 week (Patient not taking: Reported on 11/14/2018) 2 tablet 0  . gabapentin (NEURONTIN) 300 MG capsule Take 1 capsule (300 mg total) by mouth 2 (two) times daily. 60 capsule 2  . glucose blood (ACCU-CHEK GUIDE) test strip Use as instructed 100 each 12  . Icosapent Ethyl 1 g  CAPS Take 1 capsule (1 g total) by mouth 2 (two) times daily. 180 capsule 3  . Insulin Glargine (LANTUS SOLOSTAR) 100 UNIT/ML Solostar Pen Inject 45 Units into the skin at bedtime. 15 mL 2  . Insulin Pen Needle (PEN NEEDLES) 31G X 5 MM MISC 1 each by Does not apply route daily. 100 each 11  . Lancet Devices (ACCU-CHEK SOFTCLIX) lancets Use as instructed 1 each 12  . Lancets Misc. (ACCU-CHEK FASTCLIX LANCET) KIT As directed 1 kit 0  . lisinopril (ZESTRIL) 20 MG tablet Take 1 tablet (20 mg total) by mouth daily. 30 tablet 6  . methocarbamol (ROBAXIN) 500 MG tablet TAKE 1 TABLET BY MOUTH 3 TIMES DAILY. 90 tablet 0  . metoCLOPramide (REGLAN) 10 MG tablet TAKE 1 TABLET BY MOUTH EVERY EIGHT HOURS AS NEEDED FOR NAUSEA OR VOMITING. 40 tablet 0  . metoprolol tartrate (LOPRESSOR) 25 MG tablet Take 1 tablet (25 mg total) by mouth 2 (two) times daily for 30 days. 60 tablet 12  . Multiple Vitamin (MULTIVITAMIN) tablet Take 1 tablet by mouth daily.    . nitroGLYCERIN  (NITROSTAT) 0.4 MG SL tablet Place 0.4 mg under the tongue every 5 (five) minutes as needed for chest pain.    . pantoprazole (PROTONIX) 40 MG tablet Take 1 tablet (40 mg total) by mouth daily. 90 tablet 3  . sitaGLIPtin-metformin (JANUMET) 50-1000 MG tablet Take 1 tablet by mouth 2 (two) times daily with a meal. 180 tablet 1  . traZODone (DESYREL) 50 MG tablet Take 1 tablet (50 mg total) by mouth at bedtime as needed for sleep. 30 tablet 2  . vitamin B-12 (CYANOCOBALAMIN) 500 MCG tablet Take 500 mcg by mouth daily.    . vitamin C (ASCORBIC ACID) 500 MG tablet Take 500 mg by mouth daily.     No current facility-administered medications on file prior to visit.     Allergies  Allergen Reactions  . Cymbalta [Duloxetine Hcl] Nausea Only    Nausea, lack of therapeutic effect    Assessment/Plan:  1. Hyperlipidemia - LDL on last lipid panel was not able to be calculated due to TG level. On previous lipid panel it was at goal of <70. Patient biggest issue is her TG. We had a long discussion about the importance of eliminating soda and juice from the diet. Explained its effects on her blood sugars and her TG. Further explained the damage that both elevated blood sugars and TG can have on the arteries and increased risk of cardiovascular events. Talked about drinking water, flavored water such as Hint water (has no carbs) or Gatorade zero.  Patient is to continue atorvastatin 5m daily and fenofibrate 1462mdaily. I will increase Vascepa dose to 2g BID as this is the recommended dose for both TG lowering and cardiovascular risk reduction. Will repeat lipid panel in 3 months.   Thank you,  MeRamond DialPharm.D, [id number]. [address] [address]Phone: [phone]: [phone]

## 2018-12-27 NOTE — Telephone Encounter (Signed)
Called and left VM for patient to call back. Calling to inform patient that she is indeed on Vascepa. She gets for $3. No need for [facility]. Will need to advise that dose has been increased to 2 capsules twice a day and new Rx was sent to pharmacy

## 2018-12-28 NOTE — Telephone Encounter (Signed)
Patient returned call. Made aware of the increased dose of Vascepa. Patient expressed concern over the amount of medications she is taking and that she doesn't even know what they are for. Briefly reviewed medications with patient. Advised that with improvement in diet (ie reduction in sugar consumption), she may be able to eliminate some of the medications.   Explained that both the atorvastatin and Vascepa are being used to help prevent a heart attack or stroke.  Patient aware new rx are at the pharmacy for her

## 2019-02-12 DIAGNOSIS — Z23 Encounter for immunization: Secondary | ICD-10-CM | POA: Diagnosis not present

## 2019-03-01 ENCOUNTER — Other Ambulatory Visit: Payer: Self-pay | Admitting: Family Medicine

## 2019-03-01 DIAGNOSIS — Z1231 Encounter for screening mammogram for malignant neoplasm of breast: Secondary | ICD-10-CM

## 2019-03-08 ENCOUNTER — Other Ambulatory Visit: Payer: Self-pay

## 2019-03-08 ENCOUNTER — Ambulatory Visit: Payer: Medicaid Other | Attending: Family Medicine | Admitting: Pharmacist

## 2019-03-08 DIAGNOSIS — I10 Essential (primary) hypertension: Secondary | ICD-10-CM | POA: Insufficient documentation

## 2019-03-08 DIAGNOSIS — Z833 Family history of diabetes mellitus: Secondary | ICD-10-CM | POA: Insufficient documentation

## 2019-03-08 DIAGNOSIS — I2510 Atherosclerotic heart disease of native coronary artery without angina pectoris: Secondary | ICD-10-CM | POA: Insufficient documentation

## 2019-03-08 DIAGNOSIS — E1165 Type 2 diabetes mellitus with hyperglycemia: Secondary | ICD-10-CM | POA: Insufficient documentation

## 2019-03-08 DIAGNOSIS — Z794 Long term (current) use of insulin: Secondary | ICD-10-CM

## 2019-03-08 DIAGNOSIS — Z79899 Other long term (current) drug therapy: Secondary | ICD-10-CM | POA: Insufficient documentation

## 2019-03-08 DIAGNOSIS — IMO0002 Reserved for concepts with insufficient information to code with codable children: Secondary | ICD-10-CM

## 2019-03-08 DIAGNOSIS — Z8249 Family history of ischemic heart disease and other diseases of the circulatory system: Secondary | ICD-10-CM | POA: Insufficient documentation

## 2019-03-08 DIAGNOSIS — E1140 Type 2 diabetes mellitus with diabetic neuropathy, unspecified: Secondary | ICD-10-CM | POA: Diagnosis not present

## 2019-03-08 DIAGNOSIS — E118 Type 2 diabetes mellitus with unspecified complications: Secondary | ICD-10-CM | POA: Diagnosis not present

## 2019-03-08 DIAGNOSIS — E785 Hyperlipidemia, unspecified: Secondary | ICD-10-CM | POA: Insufficient documentation

## 2019-03-08 DIAGNOSIS — Z9119 Patient's noncompliance with other medical treatment and regimen: Secondary | ICD-10-CM | POA: Diagnosis not present

## 2019-03-08 LAB — GLUCOSE, POCT (MANUAL RESULT ENTRY): POC Glucose: 272 mg/dl — AB (ref 70–99)

## 2019-03-08 LAB — POCT GLYCOSYLATED HEMOGLOBIN (HGB A1C): Hemoglobin A1C: 12.2 % — AB (ref 4.0–5.6)

## 2019-03-08 NOTE — Patient Instructions (Signed)
Thank you for coming to see me today. Please do the following:  1. Increase Lantus to 52 units daily.  2. Re-start Janumet twice daily.  3. Continue checking blood sugars at home.  4. Continue making the lifestyle changes we've discussed together during our visit. Diet and exercise play a significant role in improving your blood sugars.  5. Follow-up with your PCP asap.    Hypoglycemia or low blood sugar:   Low blood sugar can happen quickly and may become an emergency if not treated right away.   While this shouldn't happen often, it can be brought upon if you skip a meal or do not eat enough. Also, if your insulin or other diabetes medications are dosed too high, this can cause your blood sugar to go to low.   Warning signs of low blood sugar include: 1. Feeling shaky or dizzy 2. Feeling weak or tired  3. Excessive hunger 4. Feeling anxious or upset  5. Sweating even when you aren't exercising  What to do if I experience low blood sugar? 1. Check your blood sugar with your meter. If lower than 70, proceed to step 2.  2. Treat with 3-4 glucose tablets or 3 packets of regular sugar. If these aren't around, you can try hard candy. Yet another option would be to drink 4 ounces of fruit juice or 6 ounces of REGULAR soda.  3. Re-check your sugar in 15 minutes. If it is still below 70, do what you did in step 2 again. If has come back up, go ahead and eat a snack or small meal at this time.

## 2019-03-09 MED ORDER — JANUMET 50-1000 MG PO TABS: 1.0000 | ORAL_TABLET | Freq: Two times a day (BID) | ORAL | 1 refills | Status: DC

## 2019-03-09 NOTE — Progress Notes (Signed)
    S:    PCP: Dr. Margarita Rana No chief complaint on file.  Patient arrives in good spirits.  Presents for diabetes evaluation, education, and management Patient was referred and last seen by Primary Care Provider on 11/30/18.   Patient reports Diabetes was diagnosed 5-6 years ago/   Family/Social History:  - DM (sister, MGM), heart disease (mother), HTN (mother), CAD/MI (father), MI (mother)  Insurance coverage/medication affordability: [employer]   Patient reports adherence with medications.  Current diabetes medications include: Lantus 45 units daily, Janumet 50-1000 mg BID (not taking) Current hypertension medications include: amlodipine 2.5 mg daily, lisinopril 20 mg daily, metoprolol 25 mg BID Current hyperlipidemia medications include: atorvastatin 80 mg daily, fenofibrate 145 mg daily, Vascepa 2 g BID  Patient denies hypoglycemic events.  Patient reported dietary habits: - Admits to high consumption of soda  Patient-reported exercise habits:  - Non-compliant    Patient reports polyuria, polydipsia.  Patient reports neuropathy. Patient denies visual changes. Patient reports self foot exams.     O:  Lab Results  Component Value Date   HGBA1C 12.2 (A) 03/08/2019   There were no vitals filed for this visit.  Lipid Panel     Component Value Date/Time   CHOL 184 11/09/2018 1158   TRIG 469 (H) 11/09/2018 1158   HDL 35 (L) 11/09/2018 1158   CHOLHDL 5.3 (H) 11/09/2018 1158   CHOLHDL 9.7 (H) 07/23/2016 1638   VLDL NOT CALC 07/23/2016 1638   LDLCALC Comment 11/09/2018 1158   Home CBG: 200s-400s per pt; no meter with her   Clinical ASCVD: Yes - CAD  A/P: Diabetes longstanding currently uncontrolled. A1c down minimally at 12.2. Patient is able to verbalize appropriate hypoglycemia management plan. Patient is not adherent with Janumet.  -Increased dose of Lantus to 52 units daily.  -Re-start Janumet 50-1000 mg BID.  -Extensively discussed pathophysiology of DM,  recommended lifestyle interventions, dietary effects on glycemic control -Counseled on s/sx of and management of hypoglycemia -Next A1C anticipated 05/2020.   ASCVD risk - secondary prevention in patient with DM. Last LDL is not controlled. High intensity statin therapy indicated. ASA indicated.  -Continued aspirin 81 mg  -Continued atorvastatin 80 mg.   HM: due for flu shot. Advised to get a PCP appointment.   Written patient instructions provided.  Total time in face to face counseling 30 minutes.   Follow-up PCP Clinic Visit 04/03/19.   Benard Halsted, PharmD, [place] [phone]

## 2019-03-16 ENCOUNTER — Telehealth: Payer: Self-pay

## 2019-03-16 NOTE — Telephone Encounter (Signed)
-----   Message from Ladell Pier, MD sent at 03/08/2019  9:35 PM EDT ----- Let pt know that A1C was 12.2 with goal being less than 7.  Please follow medication changes as discussed with clinical pharmacist today.

## 2019-03-16 NOTE — Telephone Encounter (Signed)
Patient was called on both numbers and a message states that call can not be completed at this time.

## 2019-03-21 ENCOUNTER — Other Ambulatory Visit: Payer: Medicaid Other

## 2019-04-03 ENCOUNTER — Other Ambulatory Visit: Payer: Self-pay

## 2019-04-03 ENCOUNTER — Encounter: Payer: Self-pay | Admitting: Family Medicine

## 2019-04-03 ENCOUNTER — Ambulatory Visit: Payer: Medicaid Other | Attending: Family Medicine | Admitting: Family Medicine

## 2019-04-03 VITALS — BP 107/69 | HR 66 | Temp 98.0°F | Ht 63.0 in | Wt 179.0 lb

## 2019-04-03 DIAGNOSIS — E669 Obesity, unspecified: Secondary | ICD-10-CM | POA: Insufficient documentation

## 2019-04-03 DIAGNOSIS — F329 Major depressive disorder, single episode, unspecified: Secondary | ICD-10-CM | POA: Insufficient documentation

## 2019-04-03 DIAGNOSIS — E119 Type 2 diabetes mellitus without complications: Secondary | ICD-10-CM | POA: Diagnosis present

## 2019-04-03 DIAGNOSIS — Z823 Family history of stroke: Secondary | ICD-10-CM | POA: Insufficient documentation

## 2019-04-03 DIAGNOSIS — Z794 Long term (current) use of insulin: Secondary | ICD-10-CM | POA: Diagnosis not present

## 2019-04-03 DIAGNOSIS — Z955 Presence of coronary angioplasty implant and graft: Secondary | ICD-10-CM | POA: Diagnosis not present

## 2019-04-03 DIAGNOSIS — G4730 Sleep apnea, unspecified: Secondary | ICD-10-CM | POA: Diagnosis not present

## 2019-04-03 DIAGNOSIS — K219 Gastro-esophageal reflux disease without esophagitis: Secondary | ICD-10-CM | POA: Diagnosis not present

## 2019-04-03 DIAGNOSIS — Z7982 Long term (current) use of aspirin: Secondary | ICD-10-CM | POA: Insufficient documentation

## 2019-04-03 DIAGNOSIS — Z825 Family history of asthma and other chronic lower respiratory diseases: Secondary | ICD-10-CM | POA: Insufficient documentation

## 2019-04-03 DIAGNOSIS — E1165 Type 2 diabetes mellitus with hyperglycemia: Secondary | ICD-10-CM | POA: Diagnosis not present

## 2019-04-03 DIAGNOSIS — E785 Hyperlipidemia, unspecified: Secondary | ICD-10-CM | POA: Insufficient documentation

## 2019-04-03 DIAGNOSIS — Z6831 Body mass index (BMI) 31.0-31.9, adult: Secondary | ICD-10-CM | POA: Diagnosis not present

## 2019-04-03 DIAGNOSIS — Z803 Family history of malignant neoplasm of breast: Secondary | ICD-10-CM | POA: Insufficient documentation

## 2019-04-03 DIAGNOSIS — M1990 Unspecified osteoarthritis, unspecified site: Secondary | ICD-10-CM | POA: Diagnosis not present

## 2019-04-03 DIAGNOSIS — I10 Essential (primary) hypertension: Secondary | ICD-10-CM

## 2019-04-03 DIAGNOSIS — Z833 Family history of diabetes mellitus: Secondary | ICD-10-CM | POA: Diagnosis not present

## 2019-04-03 DIAGNOSIS — I2510 Atherosclerotic heart disease of native coronary artery without angina pectoris: Secondary | ICD-10-CM | POA: Diagnosis not present

## 2019-04-03 DIAGNOSIS — Z7902 Long term (current) use of antithrombotics/antiplatelets: Secondary | ICD-10-CM | POA: Diagnosis not present

## 2019-04-03 DIAGNOSIS — Z841 Family history of disorders of kidney and ureter: Secondary | ICD-10-CM | POA: Insufficient documentation

## 2019-04-03 DIAGNOSIS — E1169 Type 2 diabetes mellitus with other specified complication: Secondary | ICD-10-CM

## 2019-04-03 DIAGNOSIS — E1142 Type 2 diabetes mellitus with diabetic polyneuropathy: Secondary | ICD-10-CM

## 2019-04-03 DIAGNOSIS — Z79899 Other long term (current) drug therapy: Secondary | ICD-10-CM | POA: Insufficient documentation

## 2019-04-03 DIAGNOSIS — Z8249 Family history of ischemic heart disease and other diseases of the circulatory system: Secondary | ICD-10-CM | POA: Insufficient documentation

## 2019-04-03 DIAGNOSIS — Z888 Allergy status to other drugs, medicaments and biological substances status: Secondary | ICD-10-CM | POA: Diagnosis not present

## 2019-04-03 LAB — POCT URINALYSIS DIP (CLINITEK)
Bilirubin, UA: NEGATIVE
Blood, UA: NEGATIVE
Glucose, UA: 500 mg/dL — AB
Ketones, POC UA: NEGATIVE mg/dL
Leukocytes, UA: NEGATIVE
Nitrite, UA: NEGATIVE
POC PROTEIN,UA: NEGATIVE
Spec Grav, UA: 1.02 (ref 1.010–1.025)
Urobilinogen, UA: 0.2 E.U./dL
pH, UA: 5.5 (ref 5.0–8.0)

## 2019-04-03 LAB — GLUCOSE, POCT (MANUAL RESULT ENTRY)
POC Glucose: 408 mg/dl — AB (ref 70–99)
POC Glucose: 338 mg/dl — AB (ref 70–99)

## 2019-04-03 MED ORDER — INSULIN ASPART 100 UNIT/ML ~~LOC~~ SOLN
15.0000 [IU] | Freq: Once | SUBCUTANEOUS | Status: AC
  Administered 2019-04-03: 15 [IU] via SUBCUTANEOUS

## 2019-04-03 MED ORDER — GABAPENTIN 300 MG PO CAPS: 300.0000 mg | ORAL_CAPSULE | Freq: Two times a day (BID) | ORAL | 2 refills | Status: DC

## 2019-04-03 MED ORDER — LANTUS SOLOSTAR 100 UNIT/ML ~~LOC~~ SOPN: 30.0000 [IU] | PEN_INJECTOR | Freq: Two times a day (BID) | SUBCUTANEOUS | 3 refills | Status: DC

## 2019-04-03 NOTE — Progress Notes (Signed)
Subjective:  Patient ID: Clarnce Charles, female    DOB: 1960/08/13  Age: 58 y.o. MRN: [id number]  CC: Diabetes   HPI Sarah Charles is a 58 year old female with a history of type 2 diabetes mellitus (A1c 12.2), hypertension, hyperlipidemia, GERD, CAD who presents today for  a follow-up visit. She had been administering 45 units of Lantus rather than 52 units agreed upon at her visit with the clinical pharmacist and her blood sugars have ranged from 151-500 with the lowest blood sugar at 92.  Her CBG in the clinic is 408 and NovoLog 15 units administered. She does have diabetic neuropathy which is currently uncontrolled and she has been taking 100 mg of gabapentin twice daily but her med list reveals she should be on 300 mg twice daily. Reflux has been stable and she is compliant with her antihypertensive. Denies chest pains or dyspnea. Past Medical History:  Diagnosis Date  . Arthritis   . Coronary artery calcification seen on CT scan    a. 07/2017 noted on CT abd.  . Depression   . GERD (gastroesophageal reflux disease)   . Hyperlipidemia   . Hypertension   . Obesity   . Sleep apnea    a. Does not use CPAP "cause i dont think I need it anymore".  . Tobacco abuse   . Type II diabetes mellitus ([place])     Past Surgical History:  Procedure Laterality Date  . CHOLECYSTECTOMY    . CORONARY STENT INTERVENTION N/A 11/04/2017   Procedure: CORONARY STENT INTERVENTION;  Surgeon: Martinique, Peter M, MD;  Location: [facility];  Service: Cardiovascular;  Laterality: N/A;  . INCISION AND DRAINAGE ABSCESS Left 12/22/2012   Procedure: INCISION AND DRAINAGE ABSCESS;  Surgeon: Jamesetta So, MD;  Location: [facility];  Service: General;  Laterality: Left;  . INTRAVASCULAR PRESSURE WIRE/FFR STUDY N/A 11/04/2017   Procedure: INTRAVASCULAR PRESSURE WIRE/FFR STUDY;  Surgeon: Martinique, Peter M, MD;  Location: [facility];  Service: Cardiovascular;  Laterality: N/A;  . LEFT HEART CATH AND CORONARY  ANGIOGRAPHY N/A 11/04/2017   Procedure: LEFT HEART CATH AND CORONARY ANGIOGRAPHY;  Surgeon: Martinique, Peter M, MD;  Location: [facility];  Service: Cardiovascular;  Laterality: N/A;  . TONSILLECTOMY    . TUBAL LIGATION      Family History  Problem Relation Age of Onset  . Hypertension Mother   . CVA Mother   . COPD Mother   . Heart disease Mother   . Kidney Stones Mother   . Heart attack Mother        MI in late 58's  . Breast cancer Mother   . CAD Father   . Hypercholesterolemia Father   . Heart attack Father        Died @ 51 of MI  . Cancer Maternal Uncle   . Diabetes Maternal Grandmother   . Cancer Maternal Grandfather        lung cancer  . Diabetes Sister     Allergies  Allergen Reactions  . Cymbalta [Duloxetine Hcl] Nausea Only    Nausea, lack of therapeutic effect    Outpatient Medications Prior to Visit  Medication Sig Dispense Refill  . aspirin EC 81 MG tablet Take 81 mg by mouth daily.    Marland Kitchen atorvastatin (LIPITOR) 80 MG tablet Take 1 tablet (80 mg total) by mouth daily. 90 tablet 3  . Blood Glucose Monitoring Suppl (ACCU-CHEK GUIDE ME) w/Device KIT 1 each by Does not apply route 3 (  three) times daily. 1 kit 0  . buPROPion (WELLBUTRIN SR) 150 MG 12 hr tablet Take 1 tablet (150 mg total) by mouth 2 (two) times daily. 60 tablet 2  . clopidogrel (PLAVIX) 75 MG tablet Take 1 tablet (75 mg total) by mouth daily. 90 tablet 2  . dicyclomine (BENTYL) 10 MG capsule Take 1 capsule by mouth daily.    . fenofibrate (TRICOR) 145 MG tablet Take 1 tablet (145 mg total) by mouth daily. 90 tablet 3  . glucose blood (ACCU-CHEK GUIDE) test strip Use as instructed 100 each 12  . Icosapent Ethyl 1 g CAPS Take 2 capsules (2 g total) by mouth 2 (two) times daily. 360 capsule 3  . Insulin Pen Needle (PEN NEEDLES) 31G X 5 MM MISC 1 each by Does not apply route daily. 100 each 11  . Lancet Devices (ACCU-CHEK SOFTCLIX) lancets Use as instructed 1 each 12  . Lancets Misc. (ACCU-CHEK  FASTCLIX LANCET) KIT As directed 1 kit 0  . lisinopril (ZESTRIL) 20 MG tablet Take 1 tablet (20 mg total) by mouth daily. 30 tablet 6  . methocarbamol (ROBAXIN) 500 MG tablet TAKE 1 TABLET BY MOUTH 3 TIMES DAILY. 90 tablet 0  . metoCLOPramide (REGLAN) 10 MG tablet TAKE 1 TABLET BY MOUTH EVERY EIGHT HOURS AS NEEDED FOR NAUSEA OR VOMITING. 40 tablet 0  . Multiple Vitamin (MULTIVITAMIN) tablet Take 1 tablet by mouth daily.    . nitroGLYCERIN (NITROSTAT) 0.4 MG SL tablet Place 0.4 mg under the tongue every 5 (five) minutes as needed for chest pain.    . pantoprazole (PROTONIX) 40 MG tablet Take 1 tablet (40 mg total) by mouth daily. 90 tablet 3  . sitaGLIPtin-metformin (JANUMET) 50-1000 MG tablet Take 1 tablet by mouth 2 (two) times daily with a meal. 180 tablet 1  . traZODone (DESYREL) 50 MG tablet Take 1 tablet (50 mg total) by mouth at bedtime as needed for sleep. 30 tablet 2  . vitamin B-12 (CYANOCOBALAMIN) 500 MCG tablet Take 500 mcg by mouth daily.    . vitamin C (ASCORBIC ACID) 500 MG tablet Take 500 mg by mouth daily.    Marland Kitchen gabapentin (NEURONTIN) 300 MG capsule Take 1 capsule (300 mg total) by mouth 2 (two) times daily. 60 capsule 2  . Insulin Glargine (LANTUS SOLOSTAR) 100 UNIT/ML Solostar Pen Inject 45 Units into the skin at bedtime. 15 mL 2  . amLODipine (NORVASC) 2.5 MG tablet Take 1 tablet (2.5 mg total) by mouth daily. 180 tablet 3  . cetirizine (ZYRTEC) 10 MG tablet Take 1 tablet (10 mg total) by mouth daily. (Patient not taking: Reported on 12/27/2018) 30 tablet 2  . fluconazole (DIFLUCAN) 150 MG tablet Take 1 now and repeat dose in 1 week (Patient not taking: Reported on 11/14/2018) 2 tablet 0  . metoprolol tartrate (LOPRESSOR) 25 MG tablet Take 1 tablet (25 mg total) by mouth 2 (two) times daily for 30 days. 60 tablet 12   No facility-administered medications prior to visit.      ROS Review of Systems  Constitutional: Negative for activity change, appetite change and fatigue.   HENT: Negative for congestion, sinus pressure and sore throat.   Eyes: Negative for visual disturbance.  Respiratory: Negative for cough, chest tightness, shortness of breath and wheezing.   Cardiovascular: Negative for chest pain and palpitations.  Gastrointestinal: Negative for abdominal distention, abdominal pain and constipation.  Endocrine: Negative for polydipsia.  Genitourinary: Negative for dysuria and frequency.  Musculoskeletal: Negative for arthralgias and  back pain.  Skin: Negative for rash.  Neurological: Negative for tremors, light-headedness and numbness.  Hematological: Does not bruise/bleed easily.  Psychiatric/Behavioral: Negative for agitation and behavioral problems.    Objective:  BP 107/69   Pulse 66   Temp 98 F (36.7 C) (Oral)   Ht '5\' 3"'  (1.6 m)   Wt 179 lb (81.2 kg)   SpO2 95%   BMI 31.71 kg/m   BP/Weight 04/03/2019 11/14/2018 4/98/2641  Systolic BP 583 094 076  Diastolic BP 69 72 70  Wt. (Lbs) 179 162 160.4  BMI 31.71 28.7 28.41      Physical Exam Constitutional:      Appearance: She is well-developed.  Neck:     Vascular: No JVD.  Cardiovascular:     Rate and Rhythm: Normal rate.     Heart sounds: Normal heart sounds. No murmur.  Pulmonary:     Effort: Pulmonary effort is normal.     Breath sounds: Normal breath sounds. No wheezing or rales.  Chest:     Chest wall: No tenderness.  Abdominal:     General: Bowel sounds are normal. There is no distension.     Palpations: Abdomen is soft. There is no mass.     Tenderness: There is no abdominal tenderness.  Musculoskeletal: Normal range of motion.     Right lower leg: No edema.     Left lower leg: No edema.  Neurological:     Mental Status: She is alert and oriented to person, place, and time.  Psychiatric:        Mood and Affect: Mood normal.     CMP Latest Ref Rng & Units 11/09/2018 03/23/2018 11/02/2017  Glucose 65 - 99 mg/dL 552(HH) 194(H) 230(H)  BUN 6 - 24 mg/dL '18 16 19   ' Creatinine 0.57 - 1.00 mg/dL 0.68 0.60 0.46(L)  Sodium 134 - 144 mmol/L 136 138 137  Potassium 3.5 - 5.2 mmol/L 4.6 4.3 4.1  Chloride 96 - 106 mmol/L 98 97 97  CO2 20 - 29 mmol/L '20 22 24  ' Calcium 8.7 - 10.2 mg/dL 9.6 10.0 10.0  Total Protein 6.0 - 8.5 g/dL 6.6 6.6 6.9  Total Bilirubin 0.0 - 1.2 mg/dL 0.2 <0.2 0.2  Alkaline Phos 39 - 117 IU/L 144(H) 118(H) 147(H)  AST 0 - 40 IU/L '17 10 12  ' ALT 0 - 32 IU/L '12 12 15    ' Lipid Panel     Component Value Date/Time   CHOL 184 11/09/2018 1158   TRIG 469 (H) 11/09/2018 1158   HDL 35 (L) 11/09/2018 1158   CHOLHDL 5.3 (H) 11/09/2018 1158   CHOLHDL 9.7 (H) 07/23/2016 1638   VLDL NOT CALC 07/23/2016 1638   LDLCALC Comment 11/09/2018 1158    CBC    Component Value Date/Time   WBC 6.5 11/09/2018 1158   WBC 7.0 10/29/2017 1151   RBC 4.59 11/09/2018 1158   RBC 4.53 10/29/2017 1151   HGB 14.2 11/09/2018 1158   HCT 42.8 11/09/2018 1158   PLT 321 11/09/2018 1158   MCV 93 11/09/2018 1158   MCH 30.9 11/09/2018 1158   MCH 30.0 10/29/2017 1151   MCHC 33.2 11/09/2018 1158   MCHC 33.0 10/29/2017 1151   RDW 12.5 11/09/2018 1158   LYMPHSABS 2.0 11/09/2018 1158   MONOABS 0.4 07/26/2017 2300   EOSABS 0.1 11/09/2018 1158   BASOSABS 0.1 11/09/2018 1158    Lab Results  Component Value Date   HGBA1C 12.2 (A) 03/08/2019    Assessment &  Plan:   1. Type 2 diabetes mellitus with hyperglycemia, with long-term current use of insulin (HCC) Uncontrolled with A1c of 12.2 CBG of 408 in the clinic, NovoLog 15 units administered patient observed for 30 minutes prior to repeating CBG UA negative for ketones Increase Lantus dose to twice daily dosing Review blood sugar log at next visit and consider addition of Victoza - Glucose (CBG) - Insulin Glargine (LANTUS SOLOSTAR) 100 UNIT/ML Solostar Pen; Inject 30 Units into the skin 2 (two) times daily.  Dispense: 30 mL; Refill: 3 - insulin aspart (novoLOG) injection 15 Units - POCT URINALYSIS DIP  (CLINITEK) - Glucose (CBG)  2. Diabetic polyneuropathy associated with type 2 diabetes mellitus ([place]) Uncontrolled she has been taking 100 mg of gabapentin twice daily rather than 300 mg. 300 mg twice daily prescribed - gabapentin (NEURONTIN) 300 MG capsule; Take 1 capsule (300 mg total) by mouth 2 (two) times daily.  Dispense: 60 capsule; Refill: 2  3. Coronary artery disease involving native coronary artery of native heart without angina pectoris Stable Risk factor modification  4. Dyslipidemia associated with type 2 diabetes mellitus ([place]) Uncontrolled with hypertriglyceridemia Continue high-dose statin, fenofibrate Continue Vascepa as per lipid clinic Repeat lipid panel at next visit  5. HYPERTENSION, BENIGN ESSENTIAL Controlled Counseled on blood pressure goal of less than 130/80, low-sodium, DASH diet, medication compliance, 150 minutes of moderate intensity exercise per week. Discussed medication compliance, adverse effects.     Meds ordered this encounter  Medications  . Insulin Glargine (LANTUS SOLOSTAR) 100 UNIT/ML Solostar Pen    Sig: Inject 30 Units into the skin 2 (two) times daily.    Dispense:  30 mL    Refill:  3    Discontinue previous dose  . gabapentin (NEURONTIN) 300 MG capsule    Sig: Take 1 capsule (300 mg total) by mouth 2 (two) times daily.    Dispense:  60 capsule    Refill:  2  . insulin aspart (novoLOG) injection 15 Units    Follow-up: Return in about 3 weeks (around 04/24/2019) for follow up of Diabetets.       Charlott Rakes, MD, FAAFP. [facility] and [facility], [place]   04/03/2019, 4:28 PM

## 2019-04-12 ENCOUNTER — Ambulatory Visit: Payer: Medicaid Other

## 2019-04-24 ENCOUNTER — Other Ambulatory Visit: Payer: Self-pay

## 2019-04-24 ENCOUNTER — Encounter: Payer: Self-pay | Admitting: Family Medicine

## 2019-04-24 ENCOUNTER — Ambulatory Visit: Payer: Medicaid Other | Attending: Family Medicine | Admitting: Family Medicine

## 2019-04-24 VITALS — BP 147/79 | HR 70 | Temp 98.4°F | Ht 63.0 in | Wt 187.0 lb

## 2019-04-24 DIAGNOSIS — Z634 Disappearance and death of family member: Secondary | ICD-10-CM

## 2019-04-24 DIAGNOSIS — E118 Type 2 diabetes mellitus with unspecified complications: Secondary | ICD-10-CM

## 2019-04-24 DIAGNOSIS — E1165 Type 2 diabetes mellitus with hyperglycemia: Secondary | ICD-10-CM

## 2019-04-24 DIAGNOSIS — IMO0002 Reserved for concepts with insufficient information to code with codable children: Secondary | ICD-10-CM

## 2019-04-24 DIAGNOSIS — Z794 Long term (current) use of insulin: Secondary | ICD-10-CM | POA: Diagnosis not present

## 2019-04-24 LAB — GLUCOSE, POCT (MANUAL RESULT ENTRY): POC Glucose: 335 mg/dl — AB (ref 70–99)

## 2019-04-24 MED ORDER — HYDROXYZINE HCL 25 MG PO TABS: 25.0000 mg | ORAL_TABLET | Freq: Three times a day (TID) | ORAL | 1 refills | Status: DC | PRN

## 2019-04-24 NOTE — Progress Notes (Signed)
Subjective:  Patient ID: Sarah Charles, female    DOB: [birth year]  Age: 58 y.o. MRN: [id number]  CC: Diabetes   HPI Sarah Charles  is a 58 year old female with a history of type 2 diabetes mellitus (A1c 12.2), hypertension, hyperlipidemia, GERD, CAD who presents today for a follow-up visit. At her last visit her Lantus was changed to twice daily dosing from 45 units daily to 30 units twice daily as her blood sugars have been running in the 500 range Husband passed away on 04/16/2019 from a heart attack and this has her down.  She is currently living with her mother and is declining any form of grief counseling but would like something to help calm down her nerves and help her sleep.  She does not recall how many units of insulin she was supposed to take but has been taking 30 units at night Blood sugars are 230, 235.  Past Medical History:  Diagnosis Date  . Arthritis   . Coronary artery calcification seen on CT scan    a. 07/2017 noted on CT abd.  . Depression   . GERD (gastroesophageal reflux disease)   . Hyperlipidemia   . Hypertension   . Obesity   . Sleep apnea    a. Does not use CPAP "cause i dont think I need it anymore".  . Tobacco abuse   . Type II diabetes mellitus ([place])     Past Surgical History:  Procedure Laterality Date  . CHOLECYSTECTOMY    . CORONARY STENT INTERVENTION N/A 11/04/2017   Procedure: CORONARY STENT INTERVENTION;  Surgeon: Martinique, Peter M, MD;  Location: [facility];  Service: Cardiovascular;  Laterality: N/A;  . INCISION AND DRAINAGE ABSCESS Left 12/22/2012   Procedure: INCISION AND DRAINAGE ABSCESS;  Surgeon: Jamesetta So, MD;  Location: [facility];  Service: General;  Laterality: Left;  . INTRAVASCULAR PRESSURE WIRE/FFR STUDY N/A 11/04/2017   Procedure: INTRAVASCULAR PRESSURE WIRE/FFR STUDY;  Surgeon: Martinique, Peter M, MD;  Location: [place] [facility];  Service: Cardiovascular;  Laterality: N/A;  . LEFT HEART CATH AND CORONARY ANGIOGRAPHY N/A 11/04/2017    Procedure: LEFT HEART CATH AND CORONARY ANGIOGRAPHY;  Surgeon: Martinique, Peter M, MD;  Location: [place] [facility];  Service: Cardiovascular;  Laterality: N/A;  . TONSILLECTOMY    . TUBAL LIGATION      Family History  Problem Relation Age of Onset  . Hypertension Mother   . CVA Mother   . COPD Mother   . Heart disease Mother   . Kidney Stones Mother   . Heart attack Mother        MI in late 86's  . Breast cancer Mother   . CAD Father   . Hypercholesterolemia Father   . Heart attack Father        Died @ 40 of MI  . Cancer Maternal Uncle   . Diabetes Maternal Grandmother   . Cancer Maternal Grandfather        lung cancer  . Diabetes Sister     Allergies  Allergen Reactions  . Cymbalta [Duloxetine Hcl] Nausea Only    Nausea, lack of therapeutic effect    Outpatient Medications Prior to Visit  Medication Sig Dispense Refill  . aspirin EC 81 MG tablet Take 81 mg by mouth daily.    Marland Kitchen atorvastatin (LIPITOR) 80 MG tablet Take 1 tablet (80 mg total) by mouth daily. 90 tablet 3  . Blood Glucose Monitoring Suppl (ACCU-CHEK GUIDE ME) w/Device KIT 1  each by Does not apply route 3 (three) times daily. 1 kit 0  . buPROPion (WELLBUTRIN SR) 150 MG 12 hr tablet Take 1 tablet (150 mg total) by mouth 2 (two) times daily. 60 tablet 2  . clopidogrel (PLAVIX) 75 MG tablet Take 1 tablet (75 mg total) by mouth daily. 90 tablet 2  . dicyclomine (BENTYL) 10 MG capsule Take 1 capsule by mouth daily.    . fenofibrate (TRICOR) 145 MG tablet Take 1 tablet (145 mg total) by mouth daily. 90 tablet 3  . gabapentin (NEURONTIN) 300 MG capsule Take 1 capsule (300 mg total) by mouth 2 (two) times daily. 60 capsule 2  . glucose blood (ACCU-CHEK GUIDE) test strip Use as instructed 100 each 12  . Icosapent Ethyl 1 g CAPS Take 2 capsules (2 g total) by mouth 2 (two) times daily. 360 capsule 3  . Insulin Glargine (LANTUS SOLOSTAR) 100 UNIT/ML Solostar Pen Inject 30 Units into the skin 2 (two) times daily. 30 mL 3   . Insulin Pen Needle (PEN NEEDLES) 31G X 5 MM MISC 1 each by Does not apply route daily. 100 each 11  . Lancet Devices (ACCU-CHEK SOFTCLIX) lancets Use as instructed 1 each 12  . Lancets Misc. (ACCU-CHEK FASTCLIX LANCET) KIT As directed 1 kit 0  . lisinopril (ZESTRIL) 20 MG tablet Take 1 tablet (20 mg total) by mouth daily. 30 tablet 6  . methocarbamol (ROBAXIN) 500 MG tablet TAKE 1 TABLET BY MOUTH 3 TIMES DAILY. 90 tablet 0  . metoCLOPramide (REGLAN) 10 MG tablet TAKE 1 TABLET BY MOUTH EVERY EIGHT HOURS AS NEEDED FOR NAUSEA OR VOMITING. 40 tablet 0  . Multiple Vitamin (MULTIVITAMIN) tablet Take 1 tablet by mouth daily.    . nitroGLYCERIN (NITROSTAT) 0.4 MG SL tablet Place 0.4 mg under the tongue every 5 (five) minutes as needed for chest pain.    . pantoprazole (PROTONIX) 40 MG tablet Take 1 tablet (40 mg total) by mouth daily. 90 tablet 3  . sitaGLIPtin-metformin (JANUMET) 50-1000 MG tablet Take 1 tablet by mouth 2 (two) times daily with a meal. 180 tablet 1  . traZODone (DESYREL) 50 MG tablet Take 1 tablet (50 mg total) by mouth at bedtime as needed for sleep. 30 tablet 2  . vitamin B-12 (CYANOCOBALAMIN) 500 MCG tablet Take 500 mcg by mouth daily.    . vitamin C (ASCORBIC ACID) 500 MG tablet Take 500 mg by mouth daily.    Marland Kitchen amLODipine (NORVASC) 2.5 MG tablet Take 1 tablet (2.5 mg total) by mouth daily. 180 tablet 3  . cetirizine (ZYRTEC) 10 MG tablet Take 1 tablet (10 mg total) by mouth daily. (Patient not taking: Reported on 12/27/2018) 30 tablet 2  . fluconazole (DIFLUCAN) 150 MG tablet Take 1 now and repeat dose in 1 week (Patient not taking: Reported on 11/14/2018) 2 tablet 0  . metoprolol tartrate (LOPRESSOR) 25 MG tablet Take 1 tablet (25 mg total) by mouth 2 (two) times daily for 30 days. 60 tablet 12   No facility-administered medications prior to visit.      ROS Review of Systems  Constitutional: Negative for activity change, appetite change and fatigue.  HENT: Negative for  congestion, sinus pressure and sore throat.   Eyes: Negative for visual disturbance.  Respiratory: Negative for cough, chest tightness, shortness of breath and wheezing.   Cardiovascular: Negative for chest pain and palpitations.  Gastrointestinal: Negative for abdominal distention, abdominal pain and constipation.  Endocrine: Negative for polydipsia.  Genitourinary: Negative for  dysuria and frequency.  Musculoskeletal: Negative for arthralgias and back pain.  Skin: Negative for rash.  Neurological: Negative for tremors, light-headedness and numbness.  Hematological: Does not bruise/bleed easily.  Psychiatric/Behavioral: Positive for dysphoric mood. Negative for agitation and behavioral problems.    Objective:  BP (!) 147/79   Pulse 70   Temp 98.4 F (36.9 C) (Oral)   Ht '5\' 3"'  (1.6 m)   Wt 187 lb (84.8 kg)   SpO2 97%   BMI 33.13 kg/m   BP/Weight 04/24/2019 04/03/2019 08/14/4008  Systolic BP 272 536 644  Diastolic BP 79 69 72  Wt. (Lbs) 187 179 162  BMI 33.13 31.71 28.7      Physical Exam Constitutional:      Appearance: She is well-developed.  Neck:     Vascular: No JVD.  Cardiovascular:     Rate and Rhythm: Normal rate.     Heart sounds: Normal heart sounds. No murmur.  Pulmonary:     Effort: Pulmonary effort is normal.     Breath sounds: Normal breath sounds. No wheezing or rales.  Chest:     Chest wall: No tenderness.  Abdominal:     General: Bowel sounds are normal. There is no distension.     Palpations: Abdomen is soft. There is no mass.     Tenderness: There is no abdominal tenderness.  Musculoskeletal: Normal range of motion.     Right lower leg: No edema.     Left lower leg: No edema.  Neurological:     Mental Status: She is alert and oriented to person, place, and time.  Psychiatric:     Comments: Dysphoric mood     CMP Latest Ref Rng & Units 11/09/2018 03/23/2018 11/02/2017  Glucose 65 - 99 mg/dL 552(HH) 194(H) 230(H)  BUN 6 - 24 mg/dL '18 16 19   ' Creatinine 0.57 - 1.00 mg/dL 0.68 0.60 0.46(L)  Sodium 134 - 144 mmol/L 136 138 137  Potassium 3.5 - 5.2 mmol/L 4.6 4.3 4.1  Chloride 96 - 106 mmol/L 98 97 97  CO2 20 - 29 mmol/L '20 22 24  ' Calcium 8.7 - 10.2 mg/dL 9.6 10.0 10.0  Total Protein 6.0 - 8.5 g/dL 6.6 6.6 6.9  Total Bilirubin 0.0 - 1.2 mg/dL 0.2 <0.2 0.2  Alkaline Phos 39 - 117 IU/L 144(H) 118(H) 147(H)  AST 0 - 40 IU/L '17 10 12  ' ALT 0 - 32 IU/L '12 12 15    ' Lipid Panel     Component Value Date/Time   CHOL 184 11/09/2018 1158   TRIG 469 (H) 11/09/2018 1158   HDL 35 (L) 11/09/2018 1158   CHOLHDL 5.3 (H) 11/09/2018 1158   CHOLHDL 9.7 (H) 07/23/2016 1638   VLDL NOT CALC 07/23/2016 1638   LDLCALC Comment 11/09/2018 1158    CBC    Component Value Date/Time   WBC 6.5 11/09/2018 1158   WBC 7.0 10/29/2017 1151   RBC 4.59 11/09/2018 1158   RBC 4.53 10/29/2017 1151   HGB 14.2 11/09/2018 1158   HCT 42.8 11/09/2018 1158   PLT 321 11/09/2018 1158   MCV 93 11/09/2018 1158   MCH 30.9 11/09/2018 1158   MCH 30.0 10/29/2017 1151   MCHC 33.2 11/09/2018 1158   MCHC 33.0 10/29/2017 1151   RDW 12.5 11/09/2018 1158   LYMPHSABS 2.0 11/09/2018 1158   MONOABS 0.4 07/26/2017 2300   EOSABS 0.1 11/09/2018 1158   BASOSABS 0.1 11/09/2018 1158    Lab Results  Component Value Date  HGBA1C 12.2 (A) 03/08/2019    Assessment & Plan:   1. Uncontrolled type 2 diabetes mellitus with complication, with long-term current use of insulin (HCC) Uncontrolled with A1c of 12.2 Advised to resume prescribed dose of 30 units twice daily; decrease Lantus by 2 units twice daily if hypoglycemia develops Recent bereavement is affecting compliance Counseled on Diabetic diet, my plate method, 834 minutes of moderate intensity exercise/week Blood sugar logs with fasting goals of 80-120 mg/dl, random of less than 180 and in the event of sugars less than 60 mg/dl or greater than 400 mg/dl encouraged to notify the clinic. Advised on the need for annual  eye exams, annual foot exams, Pneumonia vaccine. - Glucose (CBG)  2. Bereavement Recently lost her husband Declines grief counseling at this time Hydroxyzine as needed prescribed - hydrOXYzine (ATARAX/VISTARIL) 25 MG tablet; Take 1 tablet (25 mg total) by mouth 3 (three) times daily as needed.  Dispense: 60 tablet; Refill: 1    Return in about 3 months (around 07/23/2019) for chronic medical conditions.    Charlott Rakes, MD, FAAFP. [facility] [place], [place]   04/24/2019, 3:44 PM

## 2019-04-24 NOTE — Patient Instructions (Addendum)
30 units of Lantus twice daily

## 2019-04-25 ENCOUNTER — Encounter: Payer: Self-pay | Admitting: Family Medicine

## 2019-04-27 ENCOUNTER — Other Ambulatory Visit: Payer: Self-pay | Admitting: Physician Assistant

## 2019-04-27 DIAGNOSIS — J328 Other chronic sinusitis: Secondary | ICD-10-CM

## 2019-05-03 ENCOUNTER — Ambulatory Visit
Admission: RE | Admit: 2019-05-03 | Discharge: 2019-05-03 | Disposition: A | Discharge status: Home / Self Care | Payer: Medicaid Other | Source: Ambulatory Visit | Attending: Family Medicine | Admitting: Family Medicine

## 2019-05-03 ENCOUNTER — Other Ambulatory Visit: Payer: Self-pay

## 2019-05-03 DIAGNOSIS — Z1231 Encounter for screening mammogram for malignant neoplasm of breast: Secondary | ICD-10-CM | POA: Diagnosis not present

## 2019-05-14 ENCOUNTER — Ambulatory Visit (INDEPENDENT_AMBULATORY_CARE_PROVIDER_SITE_OTHER): Payer: Medicaid Other | Admitting: Family Medicine

## 2019-05-14 ENCOUNTER — Encounter: Payer: Self-pay | Admitting: Family Medicine

## 2019-05-14 DIAGNOSIS — R300 Dysuria: Secondary | ICD-10-CM | POA: Diagnosis not present

## 2019-05-14 DIAGNOSIS — R350 Frequency of micturition: Secondary | ICD-10-CM | POA: Diagnosis not present

## 2019-05-14 MED ORDER — SULFAMETHOXAZOLE-TRIMETHOPRIM 800-160 MG PO TABS: 1.0000 | ORAL_TABLET | Freq: Two times a day (BID) | ORAL | 0 refills | Status: AC

## 2019-05-14 NOTE — Progress Notes (Signed)
Virtual Visit via Telephone Note  I connected with Sarah Charles on 05/14/19 at  2:30 PM EST by telephone and verified that I am speaking with the correct person using two identifiers.   I discussed the limitations, risks, security and privacy concerns of performing an evaluation and management service by telephone and the availability of in person appointments. I also discussed with the patient that there may be a patient responsible charge related to this service. The patient expressed understanding and agreed to proceed.  Patient Location: Home Provider Location: [facility] Office Others participating in call: none   History of Present Illness:       58 yo female with complaint of increased urinary frequency, burning with urination and mild increase in low back pain which started yesterday.  Patient believes that she has urinary tract infection.  She does not believe that the urinary frequency is related to her diabetes or that the burning might be related to yeast infection.  She denies any fever or chills.  No nausea.  No abdominal pain at this time.  She is not able to come into the office today to give a urine sample but would like to have an antibiotic sent to her pharmacy. She denies any vaginal discharge or vaginal irritation.   Past Medical History:  Diagnosis Date  . Arthritis   . Coronary artery calcification seen on CT scan    a. 07/2017 noted on CT abd.  . Depression   . GERD (gastroesophageal reflux disease)   . Hyperlipidemia   . Hypertension   . Obesity   . Sleep apnea    a. Does not use CPAP "cause i dont think I need it anymore".  . Tobacco abuse   . Type II diabetes mellitus (HCC)     Past Surgical History:  Procedure Laterality Date  . CHOLECYSTECTOMY    . CORONARY STENT INTERVENTION N/A 11/04/2017   Procedure: CORONARY STENT INTERVENTION;  Surgeon: Swaziland, Peter M, MD;  Location: [facility];  Service: Cardiovascular;  Laterality: N/A;  . INCISION AND  DRAINAGE ABSCESS Left 12/22/2012   Procedure: INCISION AND DRAINAGE ABSCESS;  Surgeon: Dalia Heading, MD;  Location: [facility];  Service: General;  Laterality: Left;  . INTRAVASCULAR PRESSURE WIRE/FFR STUDY N/A 11/04/2017   Procedure: INTRAVASCULAR PRESSURE WIRE/FFR STUDY;  Surgeon: Swaziland, Peter M, MD;  Location: [facility];  Service: Cardiovascular;  Laterality: N/A;  . LEFT HEART CATH AND CORONARY ANGIOGRAPHY N/A 11/04/2017   Procedure: LEFT HEART CATH AND CORONARY ANGIOGRAPHY;  Surgeon: Swaziland, Peter M, MD;  Location: [facility];  Service: Cardiovascular;  Laterality: N/A;  . TONSILLECTOMY    . TUBAL LIGATION      Family History  Problem Relation Age of Onset  . Hypertension Mother   . CVA Mother   . COPD Mother   . Heart disease Mother   . Kidney Stones Mother   . Heart attack Mother        MI in late 69's  . Breast cancer Mother   . CAD Father   . Hypercholesterolemia Father   . Heart attack Father        Died @ 54 of MI  . Cancer Maternal Uncle   . Diabetes Maternal Grandmother   . Cancer Maternal Grandfather        lung cancer  . Diabetes Sister     Social History   Tobacco Use  . Smoking status: Former Smoker  Packs/day: 1.00    Years: 41.00    Pack years: 41.00    Types: Cigarettes    Quit date: 10/07/2018    Years since quitting: 0.6  . Smokeless tobacco: Never Used  Substance Use Topics  . Alcohol use: No  . Drug use: No     Allergies  Allergen Reactions  . Cymbalta [Duloxetine Hcl] Nausea Only    Nausea, lack of therapeutic effect       Observations/Objective: No vital signs or physical exam conducted as visit was done via telephone  Assessment and Plan: 1. Dysuria 2. Urinary frequency Patient symptoms likely represent acute cystitis/urinary tract infection.  Prescription will be sent to her pharmacy for Septra double strength twice daily x3 days which would treat an uncomplicated urinary tract infection however patient also on review  of chart has uncontrolled diabetes with most recent hemoglobin A1c done 03/08/2019 of 12.2 so it is also reasonable that her frequency and dysuria might be related to her diabetes as well as yeast infection and patient made aware that if her symptoms do not resolve after taking the antibiotic or if symptoms worsen while on antibiotic therapy then she needs to come in to be seen and give urine sample.  Increase fluids and improved blood sugar control encouraged.  Follow Up Instructions:Return for later this week or next with PCP if continued symptoms.    I discussed the assessment and treatment plan with the patient. The patient was provided an opportunity to ask questions and all were answered. The patient agreed with the plan and demonstrated an understanding of the instructions.   The patient was advised to call back or seek an in-person evaluation if the symptoms worsen or if the condition fails to improve as anticipated.  I provided 7 minutes of non-face-to-face time during this encounter.   Antony Blackbird, MD

## 2019-05-23 ENCOUNTER — Telehealth: Payer: Self-pay | Admitting: Family Medicine

## 2019-05-23 DIAGNOSIS — Z634 Disappearance and death of family member: Secondary | ICD-10-CM

## 2019-05-23 NOTE — Telephone Encounter (Signed)
PT called to speak with the nurse since has issued with the med and want some advice about her UTI

## 2019-05-23 NOTE — Telephone Encounter (Signed)
Pt call to speak with the PCP since she want you to prescribe some for her depression since her husband pass, she is crying everyday

## 2019-05-24 MED ORDER — FLUOXETINE HCL 20 MG PO TABS: 20.0000 mg | ORAL_TABLET | Freq: Every day | ORAL | 3 refills | Status: DC

## 2019-05-24 MED ORDER — CEPHALEXIN 500 MG PO CAPS: 500.0000 mg | ORAL_CAPSULE | Freq: Two times a day (BID) | ORAL | 0 refills | Status: DC

## 2019-05-24 MED ORDER — HYDROXYZINE HCL 25 MG PO TABS: 25.0000 mg | ORAL_TABLET | Freq: Three times a day (TID) | ORAL | 3 refills | Status: DC | PRN

## 2019-05-24 NOTE — Telephone Encounter (Signed)
Patient states that she has a UTI and needs medication sent over to pharmacy.

## 2019-05-24 NOTE — Telephone Encounter (Signed)
Will route to PCP for review. 

## 2019-05-24 NOTE — Telephone Encounter (Signed)
Medication sent to Pharmacy.  

## 2019-05-24 NOTE — Telephone Encounter (Signed)
Patient was called and informed of medication being sent to pharmacy. 

## 2019-05-24 NOTE — Telephone Encounter (Signed)
I have sent a prescription for Prozac to her Pharmacy which she needs to continue in addition to Hydroxyzine

## 2019-06-08 ENCOUNTER — Other Ambulatory Visit: Payer: Self-pay | Admitting: Family Medicine

## 2019-06-12 MED FILL — ACCU-CHEK GUIDE TEST STRIP: 30 days supply | Qty: 100 | Fill #0

## 2019-06-13 ENCOUNTER — Ambulatory Visit: Payer: Medicaid Other | Admitting: Cardiology

## 2019-06-29 ENCOUNTER — Encounter: Payer: Self-pay | Admitting: Cardiology

## 2019-06-29 ENCOUNTER — Other Ambulatory Visit: Payer: Self-pay

## 2019-06-29 ENCOUNTER — Ambulatory Visit (INDEPENDENT_AMBULATORY_CARE_PROVIDER_SITE_OTHER): Payer: Medicaid Other | Admitting: Cardiology

## 2019-06-29 ENCOUNTER — Other Ambulatory Visit (HOSPITAL_COMMUNITY): Payer: Self-pay | Admitting: Cardiology

## 2019-06-29 VITALS — BP 134/82 | HR 70 | Ht 63.0 in | Wt 181.8 lb

## 2019-06-29 DIAGNOSIS — Z95820 Peripheral vascular angioplasty status with implants and grafts: Secondary | ICD-10-CM | POA: Diagnosis not present

## 2019-06-29 DIAGNOSIS — E1165 Type 2 diabetes mellitus with hyperglycemia: Secondary | ICD-10-CM

## 2019-06-29 DIAGNOSIS — I739 Peripheral vascular disease, unspecified: Secondary | ICD-10-CM

## 2019-06-29 DIAGNOSIS — I2510 Atherosclerotic heart disease of native coronary artery without angina pectoris: Secondary | ICD-10-CM | POA: Diagnosis not present

## 2019-06-29 DIAGNOSIS — Z794 Long term (current) use of insulin: Secondary | ICD-10-CM

## 2019-06-29 DIAGNOSIS — Z955 Presence of coronary angioplasty implant and graft: Secondary | ICD-10-CM | POA: Diagnosis not present

## 2019-06-29 DIAGNOSIS — E118 Type 2 diabetes mellitus with unspecified complications: Secondary | ICD-10-CM | POA: Diagnosis not present

## 2019-06-29 DIAGNOSIS — E785 Hyperlipidemia, unspecified: Secondary | ICD-10-CM | POA: Diagnosis not present

## 2019-06-29 DIAGNOSIS — IMO0002 Reserved for concepts with insufficient information to code with codable children: Secondary | ICD-10-CM

## 2019-06-29 MED ORDER — ROSUVASTATIN CALCIUM 40 MG PO TABS: 40.0000 mg | ORAL_TABLET | Freq: Every day | ORAL | 2 refills | Status: DC

## 2019-06-29 NOTE — Patient Instructions (Addendum)
Medication Instructions:   STOP TAKING LIPITOR NOW  START TAKING ROSUVASTATIN 40 MG BY MOUTH DAILY  *If you need a refill on your cardiac medications before your next appointment, please call your pharmacy*   Lab Work:  IN 4 WEEKS HERE AT THE OFFICE--WE WILL CHECK CMET, CBC, TSH, LIPIDS, AND HEMOGLOBIN A1C--PLEASE COME FASTING TO THAT LAB APPOINTMENT  If you have labs (blood work) drawn today and your tests are completely normal, you will receive your results only by: Marland Kitchen MyChart Message (if you have MyChart) OR . A paper copy in the mail If you have any lab test that is abnormal or we need to change your treatment, we will call you to review the results.   Testing/Procedures:  Your physician has requested that you have a lower extremity arterial duplex. This test is an ultrasound of the arteries in the legs or arms. It looks at arterial blood flow in the legs and arms. Allow one hour for Lower and Upper Arterial scans. There are no restrictions or special instructions   Follow-Up: At [facility], you and your health needs are our priority.  As part of our continuing mission to provide you with exceptional heart care, we have created designated Provider Care Teams.  These Care Teams include your primary Cardiologist (physician) and Advanced Practice Providers (APPs -  Physician Assistants and Nurse Practitioners) who all work together to provide you with the care you need, when you need it.  Your next appointment:   6 month(s)  The format for your next appointment:   In Person  Provider:   Tobias Alexander, MD

## 2019-06-29 NOTE — Progress Notes (Signed)
06/29/2019 Sarah Charles   1961/04/22  [id number]  Primary Physician Charlott Rakes, MD Primary Cardiologist: Ena Dawley, MD  Electrophysiologist: None   Reason for Visit/CC: 6 months follow-up for CAD  HPI:  Sarah Charles is a 59 y.o. female, followed by Dr. Meda Coffee, with h/o CAD, T2DM, HLD obesity, tobacco abuse, depression and GERD.   She presented to the [facility] ED 10/29/2017 for evaluation of chest pressure with diaphoresis, nausea and shortness of breath.  She has a strong family history significant for CAD with her father dying at age 106 of MI.  She ruled out for MI and was transferred to [facility] where she underwent cardiac CTA that showed significant mid LAD stenosis by FFR.  She was discharged from the hospital and came in later for outpatient cardiac catheterization on 11/04/2017 which revealed 75% stenosis of the proximal LAD that was treated with a drug-eluting stent and placed on DAPT and high dose statin.   The patient is coming after 6 months, she denies any chest pain or shortness of breath, she is able to perform all activities of daily living, she is primary caregiver of her mother.  She has noticed claudications in her lower extremities.  She has been compliant with her medication and has noticed myalgias when taking Lipitor.  She denies any palpitation dizziness or syncope.  Cardiac Studies   LHC 11/05/18  Prox LAD lesion is 70% stenosed.  Prox Cx to Mid Cx lesion is 30% stenosed.  Prox RCA to Mid RCA lesion is 20% stenosed.  Post intervention, there is a 0% residual stenosis.  A drug-eluting stent was successfully placed using a STENT SIERRA 2.50 X 15 MM.  LV end diastolic pressure is normal.   1. Single vessel obstructive CAD.    - 70% mid LAD immediately after the first diagonal. FFR 0.78 2. Normal LVEDP 3. Successful PCI of the mid LAD with DES  Plan: DAPT for at least 6 months. She is a candidate for same day discharge. Will hold metformin for 48  hours. Will need to switch Prilosec to Protonix.   2D Echo 10/2017  Study Conclusions  - Left ventricle: The cavity size was normal. Systolic function was   normal. The estimated ejection fraction was in the range of 55%   to 60%.  Current Meds  Medication Sig  . aspirin EC 81 MG tablet Take 81 mg by mouth daily.  Marland Kitchen atorvastatin (LIPITOR) 80 MG tablet Take 1 tablet (80 mg total) by mouth daily.  . Blood Glucose Monitoring Suppl (ACCU-CHEK GUIDE ME) w/Device KIT 1 each by Does not apply route 3 (three) times daily.  Marland Kitchen buPROPion (WELLBUTRIN SR) 150 MG 12 hr tablet Take 1 tablet (150 mg total) by mouth 2 (two) times daily.  . cephALEXin (KEFLEX) 500 MG capsule Take 1 capsule (500 mg total) by mouth 2 (two) times daily.  . cetirizine (ZYRTEC) 10 MG tablet TAKE 1 TABLET EVERY DAY  . clopidogrel (PLAVIX) 75 MG tablet Take 1 tablet (75 mg total) by mouth daily.  Marland Kitchen dicyclomine (BENTYL) 10 MG capsule Take 1 capsule by mouth daily.  . fenofibrate (TRICOR) 145 MG tablet Take 1 tablet (145 mg total) by mouth daily.  Marland Kitchen FLUoxetine (PROZAC) 20 MG tablet Take 1 tablet (20 mg total) by mouth daily.  Marland Kitchen gabapentin (NEURONTIN) 300 MG capsule Take 1 capsule (300 mg total) by mouth 2 (two) times daily.  Marland Kitchen glucose blood (ACCU-CHEK GUIDE) test strip Use as instructed  . hydrOXYzine (  ATARAX/VISTARIL) 25 MG tablet Take 1 tablet (25 mg total) by mouth 3 (three) times daily as needed.  . Insulin Glargine (LANTUS SOLOSTAR) 100 UNIT/ML Solostar Pen Inject 30 Units into the skin 2 (two) times daily.  . Insulin Pen Needle (PEN NEEDLES) 31G X 5 MM MISC 1 each by Does not apply route daily.  . Lancets Misc. (ACCU-CHEK FASTCLIX LANCET) KIT As directed  . lisinopril (ZESTRIL) 20 MG tablet Take 1 tablet (20 mg total) by mouth daily.  . methocarbamol (ROBAXIN) 500 MG tablet TAKE 1 TABLET BY MOUTH 3 TIMES DAILY.  Marland Kitchen metoCLOPramide (REGLAN) 10 MG tablet TAKE 1 TABLET BY MOUTH EVERY EIGHT HOURS AS NEEDED FOR NAUSEA OR VOMITING.   . Multiple Vitamin (MULTIVITAMIN) tablet Take 1 tablet by mouth daily.  . nitroGLYCERIN (NITROSTAT) 0.4 MG SL tablet Place 0.4 mg under the tongue every 5 (five) minutes as needed for chest pain.  . pantoprazole (PROTONIX) 40 MG tablet Take 1 tablet (40 mg total) by mouth daily.  . sitaGLIPtin-metformin (JANUMET) 50-1000 MG tablet Take 1 tablet by mouth 2 (two) times daily with a meal.  . traZODone (DESYREL) 50 MG tablet Take 1 tablet (50 mg total) by mouth at bedtime as needed for sleep.  . vitamin B-12 (CYANOCOBALAMIN) 500 MCG tablet Take 500 mcg by mouth daily.  . vitamin C (ASCORBIC ACID) 500 MG tablet Take 500 mg by mouth daily.   Allergies  Allergen Reactions  . Cymbalta [Duloxetine Hcl] Nausea Only    Nausea, lack of therapeutic effect   Past Medical History:  Diagnosis Date  . Arthritis   . Coronary artery calcification seen on CT scan    a. 07/2017 noted on CT abd.  . Depression   . GERD (gastroesophageal reflux disease)   . Hyperlipidemia   . Hypertension   . Obesity   . Sleep apnea    a. Does not use CPAP "cause i dont think I need it anymore".  . Tobacco abuse   . Type II diabetes mellitus (HCC)    Family History  Problem Relation Age of Onset  . Hypertension Mother   . CVA Mother   . COPD Mother   . Heart disease Mother   . Kidney Stones Mother   . Heart attack Mother        MI in late 16's  . Breast cancer Mother   . CAD Father   . Hypercholesterolemia Father   . Heart attack Father        Died @ 49 of MI  . Cancer Maternal Uncle   . Diabetes Maternal Grandmother   . Cancer Maternal Grandfather        lung cancer  . Diabetes Sister    Past Surgical History:  Procedure Laterality Date  . CHOLECYSTECTOMY    . CORONARY STENT INTERVENTION N/A 11/04/2017   Procedure: CORONARY STENT INTERVENTION;  Surgeon: Martinique, Peter M, MD;  Location: [place] [facility];  Service: Cardiovascular;  Laterality: N/A;  . INCISION AND DRAINAGE ABSCESS Left 12/22/2012    Procedure: INCISION AND DRAINAGE ABSCESS;  Surgeon: Jamesetta So, MD;  Location: [facility];  Service: General;  Laterality: Left;  . INTRAVASCULAR PRESSURE WIRE/FFR STUDY N/A 11/04/2017   Procedure: INTRAVASCULAR PRESSURE WIRE/FFR STUDY;  Surgeon: Martinique, Peter M, MD;  Location: [place] [facility];  Service: Cardiovascular;  Laterality: N/A;  . LEFT HEART CATH AND CORONARY ANGIOGRAPHY N/A 11/04/2017   Procedure: LEFT HEART CATH AND CORONARY ANGIOGRAPHY;  Surgeon: Martinique, Peter M, MD;  Location: [place] [facility];  Service: Cardiovascular;  Laterality: N/A;  . TONSILLECTOMY    . TUBAL LIGATION     Social History   Socioeconomic History  . Marital status: Married    Spouse name: Not on file  . Number of children: Not on file  . Years of education: Not on file  . Highest education level: Not on file  Occupational History  . Not on file  Tobacco Use  . Smoking status: Former Smoker    Packs/day: 1.00    Years: 41.00    Pack years: 41.00    Types: Cigarettes    Quit date: 10/07/2018    Years since quitting: 0.7  . Smokeless tobacco: Never Used  Substance and Sexual Activity  . Alcohol use: No  . Drug use: No  . Sexual activity: Never    Birth control/protection: None  Other Topics Concern  . Not on file  Social History Narrative   Lives in [place] with husband.  Unemployed.  Does not routinely exercise.   Social Determinants of Health   Financial Resource Strain:   . Difficulty of Paying Living Expenses: Not on file  Food Insecurity:   . Worried About Charity fundraiser in the Last Year: Not on file  . Ran Out of Food in the Last Year: Not on file  Transportation Needs:   . Lack of Transportation (Medical): Not on file  . Lack of Transportation (Non-Medical): Not on file  Physical Activity:   . Days of Exercise per Week: Not on file  . Minutes of Exercise per Session: Not on file  Stress:   . Feeling of Stress : Not on file  Social Connections:   . Frequency of  Communication with Friends and Family: Not on file  . Frequency of Social Gatherings with Friends and Family: Not on file  . Attends Religious Services: Not on file  . Active Member of Clubs or Organizations: Not on file  . Attends Archivist Meetings: Not on file  . Marital Status: Not on file  Intimate Partner Violence:   . Fear of Current or Ex-Partner: Not on file  . Emotionally Abused: Not on file  . Physically Abused: Not on file  . Sexually Abused: Not on file     Lipid Panel     Component Value Date/Time   CHOL 184 11/09/2018 1158   TRIG 469 (H) 11/09/2018 1158   HDL 35 (L) 11/09/2018 1158   CHOLHDL 5.3 (H) 11/09/2018 1158   CHOLHDL 9.7 (H) 07/23/2016 1638   VLDL NOT CALC 07/23/2016 1638   [place] Comment 11/09/2018 1158    Review of Systems: General: negative for chills, fever, night sweats or weight changes.  Cardiovascular: negative for chest pain, dyspnea on exertion, edema, orthopnea, palpitations, paroxysmal nocturnal dyspnea or shortness of breath Dermatological: negative for rash Respiratory: negative for cough or wheezing Urologic: negative for hematuria Abdominal: negative for nausea, vomiting, diarrhea, bright red blood per rectum, melena, or hematemesis Neurologic: negative for visual changes, syncope, or dizziness All other systems reviewed and are otherwise negative except as noted above.   Physical Exam:  Blood pressure 134/82, pulse 70, height '5\' 3"'  (1.6 m), weight 181 lb 12.8 oz (82.5 kg), SpO2 96 %.  General appearance: alert, cooperative and no distress Neck: no adenopathy, no carotid bruit, no JVD and thyroid not enlarged, symmetric, no tenderness/mass/nodules Lungs: clear to auscultation bilaterally Heart: regular rate and rhythm, S1, S2 normal, no murmur, click, rub or  gallop Extremities: extremities normal, atraumatic, no cyanosis or edema Pulses: 2+ and symmetric Skin: Skin color, texture, turgor normal. No rashes or  lesions Neurologic: Grossly normal  EKG NSR 69 bpm normal EKG unchanged from prior-- personally reviewed   ASSESSMENT AND PLAN:   1. CAD: cardiac cath in June 2019 showed single vessel obstructive CAD, with 70% mid LAD immediately after the first diagonal. FFR 0.78. Normal LVEDP. She underwent successful PCI of the mid LAD with DES. RCA and LCx with mild disease, 20-30% disease. Stable w/o CP. EKG shows NSR. No ischemia. HR and BP well controlled. Continue ASA, Plavix, has no bleeding.  She has myalgias with Lipitor, will switch to Crestor 40 mg daily and check lipids and CMP in 1 months.  Continue metoprolol and lisinopril.   2. Tobacco Abuse: pt has quit smoking. She was congratulated on her efforts.   3. HLD: At the last blood draw in June 2020 she had severely elevated triglycerides and therefore LDL was not calculated, we will start Crestor 40 mg daily she is also on TriCor, this is most probably secondary to poorly controlled diabetes.  We will obtain lipids in 1 month.  4. HTN: controlled on current regimen.   5. DM: managed by PCP. Poorly controlled.  This is affecting her lipids as well.  6.  Claudications -will obtain bilateral lower extremity arterial ultrasound.  Follow-Up w/ Dr. Meda Coffee in 6 months.  Obtain CBC, CMP, TSH and lipids as well as hemoglobin A1c in 4 weeks.  Ena Dawley, MD [facility] 06/29/2019 3:15 PM

## 2019-07-09 ENCOUNTER — Ambulatory Visit (HOSPITAL_COMMUNITY)
Admission: RE | Admit: 2019-07-09 | Payer: Medicaid Other | Source: Ambulatory Visit | Attending: Cardiology | Admitting: Cardiology

## 2019-07-10 ENCOUNTER — Ambulatory Visit (HOSPITAL_COMMUNITY)
Admission: RE | Admit: 2019-07-10 | Discharge: 2019-07-10 | Disposition: A | Discharge status: Home / Self Care | Payer: Medicaid Other | Source: Ambulatory Visit | Attending: Cardiovascular Disease | Admitting: Cardiovascular Disease

## 2019-07-10 ENCOUNTER — Other Ambulatory Visit (HOSPITAL_COMMUNITY): Payer: Self-pay | Admitting: Cardiology

## 2019-07-10 ENCOUNTER — Other Ambulatory Visit: Payer: Self-pay

## 2019-07-10 DIAGNOSIS — I739 Peripheral vascular disease, unspecified: Secondary | ICD-10-CM | POA: Diagnosis not present

## 2019-07-23 ENCOUNTER — Encounter: Payer: Self-pay | Admitting: Family Medicine

## 2019-07-23 ENCOUNTER — Ambulatory Visit: Payer: Medicaid Other | Attending: Family Medicine | Admitting: Family Medicine

## 2019-07-23 VITALS — BP 157/76 | HR 67 | Ht 63.0 in | Wt 165.0 lb

## 2019-07-23 DIAGNOSIS — I10 Essential (primary) hypertension: Secondary | ICD-10-CM | POA: Insufficient documentation

## 2019-07-23 DIAGNOSIS — E785 Hyperlipidemia, unspecified: Secondary | ICD-10-CM | POA: Diagnosis not present

## 2019-07-23 DIAGNOSIS — K219 Gastro-esophageal reflux disease without esophagitis: Secondary | ICD-10-CM | POA: Insufficient documentation

## 2019-07-23 DIAGNOSIS — E1142 Type 2 diabetes mellitus with diabetic polyneuropathy: Secondary | ICD-10-CM

## 2019-07-23 DIAGNOSIS — Z79899 Other long term (current) drug therapy: Secondary | ICD-10-CM | POA: Insufficient documentation

## 2019-07-23 DIAGNOSIS — F329 Major depressive disorder, single episode, unspecified: Secondary | ICD-10-CM | POA: Insufficient documentation

## 2019-07-23 DIAGNOSIS — E1165 Type 2 diabetes mellitus with hyperglycemia: Secondary | ICD-10-CM | POA: Diagnosis not present

## 2019-07-23 DIAGNOSIS — Z833 Family history of diabetes mellitus: Secondary | ICD-10-CM | POA: Insufficient documentation

## 2019-07-23 DIAGNOSIS — Z955 Presence of coronary angioplasty implant and graft: Secondary | ICD-10-CM | POA: Insufficient documentation

## 2019-07-23 DIAGNOSIS — Z888 Allergy status to other drugs, medicaments and biological substances status: Secondary | ICD-10-CM | POA: Diagnosis not present

## 2019-07-23 DIAGNOSIS — I739 Peripheral vascular disease, unspecified: Secondary | ICD-10-CM

## 2019-07-23 DIAGNOSIS — E1151 Type 2 diabetes mellitus with diabetic peripheral angiopathy without gangrene: Secondary | ICD-10-CM | POA: Diagnosis not present

## 2019-07-23 DIAGNOSIS — G4730 Sleep apnea, unspecified: Secondary | ICD-10-CM | POA: Insufficient documentation

## 2019-07-23 DIAGNOSIS — E118 Type 2 diabetes mellitus with unspecified complications: Secondary | ICD-10-CM | POA: Diagnosis not present

## 2019-07-23 DIAGNOSIS — Z794 Long term (current) use of insulin: Secondary | ICD-10-CM | POA: Insufficient documentation

## 2019-07-23 DIAGNOSIS — B86 Scabies: Secondary | ICD-10-CM | POA: Diagnosis not present

## 2019-07-23 DIAGNOSIS — Z8249 Family history of ischemic heart disease and other diseases of the circulatory system: Secondary | ICD-10-CM | POA: Insufficient documentation

## 2019-07-23 DIAGNOSIS — E669 Obesity, unspecified: Secondary | ICD-10-CM | POA: Insufficient documentation

## 2019-07-23 DIAGNOSIS — F32A Depression, unspecified: Secondary | ICD-10-CM

## 2019-07-23 DIAGNOSIS — Z95820 Peripheral vascular angioplasty status with implants and grafts: Secondary | ICD-10-CM

## 2019-07-23 DIAGNOSIS — I2510 Atherosclerotic heart disease of native coronary artery without angina pectoris: Secondary | ICD-10-CM | POA: Insufficient documentation

## 2019-07-23 DIAGNOSIS — E1169 Type 2 diabetes mellitus with other specified complication: Secondary | ICD-10-CM | POA: Diagnosis not present

## 2019-07-23 DIAGNOSIS — Z8349 Family history of other endocrine, nutritional and metabolic diseases: Secondary | ICD-10-CM | POA: Insufficient documentation

## 2019-07-23 DIAGNOSIS — Z7982 Long term (current) use of aspirin: Secondary | ICD-10-CM | POA: Diagnosis not present

## 2019-07-23 LAB — POCT URINALYSIS DIP (CLINITEK)
Bilirubin, UA: NEGATIVE
Blood, UA: NEGATIVE
Glucose, UA: 500 mg/dL — AB
Ketones, POC UA: NEGATIVE mg/dL
Leukocytes, UA: NEGATIVE
Nitrite, UA: NEGATIVE
POC PROTEIN,UA: NEGATIVE
Spec Grav, UA: 1.015 (ref 1.010–1.025)
Urobilinogen, UA: 0.2 E.U./dL
pH, UA: 5 (ref 5.0–8.0)

## 2019-07-23 LAB — GLUCOSE, POCT (MANUAL RESULT ENTRY)
POC Glucose: 428 mg/dl — AB (ref 70–99)
POC Glucose: 337 mg/dl — AB (ref 70–99)

## 2019-07-23 MED ORDER — LANTUS SOLOSTAR 100 UNIT/ML ~~LOC~~ SOPN: 50.0000 [IU] | PEN_INJECTOR | Freq: Every day | SUBCUTANEOUS | 3 refills | Status: DC

## 2019-07-23 MED ORDER — GABAPENTIN 300 MG PO CAPS: 300.0000 mg | ORAL_CAPSULE | Freq: Two times a day (BID) | ORAL | 6 refills | Status: DC

## 2019-07-23 MED ORDER — ROSUVASTATIN CALCIUM 40 MG PO TABS: 40.0000 mg | ORAL_TABLET | Freq: Every day | ORAL | 2 refills | Status: DC

## 2019-07-23 MED ORDER — FLUOXETINE HCL 20 MG PO TABS: 20.0000 mg | ORAL_TABLET | Freq: Every day | ORAL | 6 refills | Status: DC

## 2019-07-23 MED ORDER — INSULIN ASPART 100 UNIT/ML ~~LOC~~ SOLN
10.0000 [IU] | Freq: Once | SUBCUTANEOUS | Status: AC
  Administered 2019-07-23: 10 [IU] via SUBCUTANEOUS

## 2019-07-23 MED ORDER — PERMETHRIN 5 % EX CREA: 1.0000 "application " | TOPICAL_CREAM | Freq: Once | CUTANEOUS | 1 refills | Status: AC

## 2019-07-23 MED ORDER — JANUMET 50-1000 MG PO TABS: 1.0000 | ORAL_TABLET | Freq: Two times a day (BID) | ORAL | 1 refills | Status: DC

## 2019-07-23 NOTE — Progress Notes (Signed)
Subjective:  Patient ID: Sarah Charles, female    DOB: [birth year]  Age: 59 y.o. MRN: [id number]  CC: Diabetes   HPI Sarah Charles is a 59 year old female with a history of type 2 diabetes mellitus (A1c 12.2), hypertension, hyperlipidemia, GERD, CAD (s/p DES)who presents today fora follow-up visit. She had a Cardiology visit last month at which time LIpitor was changed to Crestor. She underwent PAD workup which revealed normal ABI b/l. She has no chest pain, dyspnea, pedal edema.  She has been busy taking care of her Mom and has been forgetting to take morning dose of Lantus but endorses compliance with Lantus 30 units and CBG in the clinic is 428. Fasting sugars have been in the 300 at home. BP is elevated and she is yet to take antihypertensive today as she states she has been busy.  She has a rash on her arms which are more itchy at night. Denies presence of fever or introduction of new soaps or creams.  Past Medical History:  Diagnosis Date  . Arthritis   . Coronary artery calcification seen on CT scan    a. 07/2017 noted on CT abd.  . Depression   . GERD (gastroesophageal reflux disease)   . Hyperlipidemia   . Hypertension   . Obesity   . Sleep apnea    a. Does not use CPAP "cause i dont think I need it anymore".  . Tobacco abuse   . Type II diabetes mellitus ([place])     Past Surgical History:  Procedure Laterality Date  . CHOLECYSTECTOMY    . CORONARY STENT INTERVENTION N/A 11/04/2017   Procedure: CORONARY STENT INTERVENTION;  Surgeon: Martinique, Peter M, MD;  Location: [place] [facility];  Service: Cardiovascular;  Laterality: N/A;  . INCISION AND DRAINAGE ABSCESS Left 12/22/2012   Procedure: INCISION AND DRAINAGE ABSCESS;  Surgeon: Jamesetta So, MD;  Location: [facility];  Service: General;  Laterality: Left;  . INTRAVASCULAR PRESSURE WIRE/FFR STUDY N/A 11/04/2017   Procedure: INTRAVASCULAR PRESSURE WIRE/FFR STUDY;  Surgeon: Martinique, Peter M, MD;  Location: [place] [facility];   Service: Cardiovascular;  Laterality: N/A;  . LEFT HEART CATH AND CORONARY ANGIOGRAPHY N/A 11/04/2017   Procedure: LEFT HEART CATH AND CORONARY ANGIOGRAPHY;  Surgeon: Martinique, Peter M, MD;  Location: [place] [facility];  Service: Cardiovascular;  Laterality: N/A;  . TONSILLECTOMY    . TUBAL LIGATION      Family History  Problem Relation Age of Onset  . Hypertension Mother   . CVA Mother   . COPD Mother   . Heart disease Mother   . Kidney Stones Mother   . Heart attack Mother        MI in late 26's  . Breast cancer Mother   . CAD Father   . Hypercholesterolemia Father   . Heart attack Father        Died @ 5 of MI  . Cancer Maternal Uncle   . Diabetes Maternal Grandmother   . Cancer Maternal Grandfather        lung cancer  . Diabetes Sister     Allergies  Allergen Reactions  . Cymbalta [Duloxetine Hcl] Nausea Only    Nausea, lack of therapeutic effect    Outpatient Medications Prior to Visit  Medication Sig Dispense Refill  . aspirin EC 81 MG tablet Take 81 mg by mouth daily.    . Blood Glucose Monitoring Suppl (ACCU-CHEK GUIDE ME) w/Device KIT 1 each by Does not apply route  3 (three) times daily. 1 kit 0  . buPROPion (WELLBUTRIN SR) 150 MG 12 hr tablet Take 1 tablet (150 mg total) by mouth 2 (two) times daily. 60 tablet 2  . cephALEXin (KEFLEX) 500 MG capsule Take 1 capsule (500 mg total) by mouth 2 (two) times daily. 14 capsule 0  . cetirizine (ZYRTEC) 10 MG tablet TAKE 1 TABLET EVERY DAY 30 tablet 1  . clopidogrel (PLAVIX) 75 MG tablet Take 1 tablet (75 mg total) by mouth daily. 90 tablet 2  . dicyclomine (BENTYL) 10 MG capsule Take 1 capsule by mouth daily.    . fenofibrate (TRICOR) 145 MG tablet Take 1 tablet (145 mg total) by mouth daily. 90 tablet 3  . FLUoxetine (PROZAC) 20 MG tablet Take 1 tablet (20 mg total) by mouth daily. 30 tablet 3  . gabapentin (NEURONTIN) 300 MG capsule Take 1 capsule (300 mg total) by mouth 2 (two) times daily. 60 capsule 2  . glucose  blood (ACCU-CHEK GUIDE) test strip Use as instructed 100 each 12  . hydrOXYzine (ATARAX/VISTARIL) 25 MG tablet Take 1 tablet (25 mg total) by mouth 3 (three) times daily as needed. 60 tablet 3  . Insulin Glargine (LANTUS SOLOSTAR) 100 UNIT/ML Solostar Pen Inject 30 Units into the skin 2 (two) times daily. 30 mL 3  . Insulin Pen Needle (PEN NEEDLES) 31G X 5 MM MISC 1 each by Does not apply route daily. 100 each 11  . Lancets Misc. (ACCU-CHEK FASTCLIX LANCET) KIT As directed 1 kit 0  . lisinopril (ZESTRIL) 20 MG tablet Take 1 tablet (20 mg total) by mouth daily. 30 tablet 6  . methocarbamol (ROBAXIN) 500 MG tablet TAKE 1 TABLET BY MOUTH 3 TIMES DAILY. 90 tablet 0  . metoCLOPramide (REGLAN) 10 MG tablet TAKE 1 TABLET BY MOUTH EVERY EIGHT HOURS AS NEEDED FOR NAUSEA OR VOMITING. 40 tablet 0  . Multiple Vitamin (MULTIVITAMIN) tablet Take 1 tablet by mouth daily.    . nitroGLYCERIN (NITROSTAT) 0.4 MG SL tablet Place 0.4 mg under the tongue every 5 (five) minutes as needed for chest pain.    . pantoprazole (PROTONIX) 40 MG tablet Take 1 tablet (40 mg total) by mouth daily. 90 tablet 3  . rosuvastatin (CRESTOR) 40 MG tablet Take 1 tablet (40 mg total) by mouth daily. 90 tablet 2  . sitaGLIPtin-metformin (JANUMET) 50-1000 MG tablet Take 1 tablet by mouth 2 (two) times daily with a meal. 180 tablet 1  . traZODone (DESYREL) 50 MG tablet Take 1 tablet (50 mg total) by mouth at bedtime as needed for sleep. 30 tablet 2  . vitamin B-12 (CYANOCOBALAMIN) 500 MCG tablet Take 500 mcg by mouth daily.    . vitamin C (ASCORBIC ACID) 500 MG tablet Take 500 mg by mouth daily.    Marland Kitchen amLODipine (NORVASC) 2.5 MG tablet Take 1 tablet (2.5 mg total) by mouth daily. 180 tablet 3  . metoprolol tartrate (LOPRESSOR) 25 MG tablet Take 1 tablet (25 mg total) by mouth 2 (two) times daily for 30 days. 60 tablet 12   No facility-administered medications prior to visit.     ROS Review of Systems  Constitutional: Negative for  activity change, appetite change and fatigue.  HENT: Negative for congestion, sinus pressure and sore throat.   Eyes: Negative for visual disturbance.  Respiratory: Negative for cough, chest tightness, shortness of breath and wheezing.   Cardiovascular: Negative for chest pain and palpitations.  Gastrointestinal: Negative for abdominal distention, abdominal pain and constipation.  Endocrine:  Negative for polydipsia.  Genitourinary: Negative for dysuria and frequency.  Musculoskeletal: Negative for arthralgias and back pain.  Skin: Positive for rash.  Neurological: Negative for tremors, light-headedness and numbness.  Hematological: Does not bruise/bleed easily.  Psychiatric/Behavioral: Negative for agitation and behavioral problems.    Objective:  BP (!) 157/76   Pulse 67   Ht _0  (1.6 m)   Wt 165 lb (74.8 kg)   SpO2 97%   BMI 29.23 kg/m   BP/Weight 07/23/2019 06/29/2019 93/12/1827  Systolic BP 937 169 678  Diastolic BP 76 82 79  Wt. (Lbs) 165 181.8 187  BMI 29.23 32.2 33.13      Physical Exam Constitutional:      Appearance: She is well-developed.  Neck:     Vascular: No JVD.  Cardiovascular:     Rate and Rhythm: Normal rate.     Heart sounds: Normal heart sounds. No murmur.  Pulmonary:     Effort: Pulmonary effort is normal.     Breath sounds: Normal breath sounds. No wheezing or rales.  Chest:     Chest wall: No tenderness.  Abdominal:     General: Bowel sounds are normal. There is no distension.     Palpations: Abdomen is soft. There is no mass.     Tenderness: There is no abdominal tenderness.  Musculoskeletal:        General: Normal range of motion.     Right lower leg: No edema.     Left lower leg: No edema.  Skin:    Comments: Pin point rash with scratch marks on extremities,  trunk  Neurological:     Mental Status: She is alert and oriented to person, place, and time.  Psychiatric:        Mood and Affect: Mood normal.     CMP Latest Ref Rng & Units  11/09/2018 03/23/2018 11/02/2017  Glucose 65 - 99 mg/dL 552(HH) 194(H) 230(H)  BUN 6 - 24 mg/dL _1 Creatinine 0.57 - 1.00 mg/dL 0.68 0.60 0.46(L)  Sodium 134 - 144 mmol/L 136 138 137  Potassium 3.5 - 5.2 mmol/L 4.6 4.3 4.1  Chloride 96 - 106 mmol/L 98 97 97  CO2 20 - 29 mmol/L _2 Calcium 8.7 - 10.2 mg/dL 9.6 10.0 10.0  Total Protein 6.0 - 8.5 g/dL 6.6 6.6 6.9  Total Bilirubin 0.0 - 1.2 mg/dL 0.2 <0.2 0.2  Alkaline Phos 39 - 117 IU/L 144(H) 118(H) 147(H)  AST 0 - 40 IU/L _3 ALT 0 - 32 IU/L _4 Lipid Panel     Component Value Date/Time   CHOL 184 11/09/2018 1158   TRIG 469 (H) 11/09/2018 1158   HDL 35 (L) 11/09/2018 1158   CHOLHDL 5.3 (H) 11/09/2018 1158   CHOLHDL 9.7 (H) 07/23/2016 1638   VLDL NOT CALC 07/23/2016 1638   LDLCALC Comment 11/09/2018 1158    CBC    Component Value Date/Time   WBC 6.5 11/09/2018 1158   WBC 7.0 10/29/2017 1151   RBC 4.59 11/09/2018 1158   RBC 4.53 10/29/2017 1151   HGB 14.2 11/09/2018 1158   HCT 42.8 11/09/2018 1158   PLT 321 11/09/2018 1158   MCV 93 11/09/2018 1158   MCH 30.9 11/09/2018 1158   MCH 30.0 10/29/2017 1151   MCHC 33.2 11/09/2018 1158   MCHC 33.0 10/29/2017 1151   RDW 12.5 11/09/2018 1158   LYMPHSABS 2.0 11/09/2018 1158   MONOABS 0.4 07/26/2017  2300   EOSABS 0.1 11/09/2018 1158   BASOSABS 0.1 11/09/2018 1158   Lab Results  Component Value Date   HGBA1C 12.2 (A) 03/08/2019     Assessment & Plan:   1. Type 2 diabetes mellitus with hyperglycemia, with long-term current use of insulin (HCC) Severe hyperglycemia with high risk of progressing to DKA which can cause harm to the patient UA positive for glycosuria, negative for ketones Oral hydration administered along with 10 units of insulin to prevent complications. Repeat CBG after observation and insulin improved to 337 Uncontrolled with A1c of 12.2 due to non compliance Twice daily dosing has been difficult for her so I will switch back from  30 units bid to 50 units daily (of note she was previously on daily dosing and had elevated sugars) Poor compliance with diet and lifestyle largely contributory. Counseled on Diabetic diet, my plate method, 937 minutes of moderate intensity exercise/week Blood sugar logs with fasting goals of 80-120 mg/dl, random of less than 180 and in the event of sugars less than 60 mg/dl or greater than 400 mg/dl encouraged to notify the clinic. Advised on the need for annual eye exams, annual foot exams, Pneumonia vaccine. - POCT glucose (manual entry) - Hemoglobin A1c - Insulin Glargine (LANTUS SOLOSTAR) 100 UNIT/ML Solostar Pen; Inject 50 Units into the skin at bedtime.  Dispense: 30 mL; Refill: 3 - Microalbumin / creatinine urine ratio - Lipid panel - CMP14+EGFR - sitaGLIPtin-metformin (JANUMET) 50-1000 MG tablet; Take 1 tablet by mouth 2 (two) times daily with a meal.  Dispense: 180 tablet; Refill: 1 - rosuvastatin (CRESTOR) 40 MG tablet; Take 1 tablet (40 mg total) by mouth daily.  Dispense: 90 tablet; Refill: 2 - POCT glucose (manual entry) - POCT URINALYSIS DIP (CLINITEK) - insulin aspart (novoLOG) injection 10 Units  2. Scabies Discussed care of beddings - permethrin (ELIMITE) 5 % cream; Apply 1 application topically once for 1 dose. Apply from head to toe for 8 to 14 h then wash off  Dispense: 60 g; Refill: 1  3. Claudication [facility]) AMI negative for PAD - rosuvastatin (CRESTOR) 40 MG tablet; Take 1 tablet (40 mg total) by mouth daily.  Dispense: 90 tablet; Refill: 2  4. Coronary artery disease involving native coronary artery of native heart without angina pectoris S/p stent Risk factor modification - sitaGLIPtin-metformin (JANUMET) 50-1000 MG tablet; Take 1 tablet by mouth 2 (two) times daily with a meal.  Dispense: 180 tablet; Refill: 1 - rosuvastatin (CRESTOR) 40 MG tablet; Take 1 tablet (40 mg total) by mouth daily.  Dispense: 90 tablet; Refill: 2  5. S/P angioplasty with stent See #4  above - rosuvastatin (CRESTOR) 40 MG tablet; Take 1 tablet (40 mg total) by mouth daily.  Dispense: 90 tablet; Refill: 2  6. Hyperlipidemia associated with type 2 diabetes mellitus ([place]) Uncntrolled due to non compliance Counseled on low cholesterol diet - rosuvastatin (CRESTOR) 40 MG tablet; Take 1 tablet (40 mg total) by mouth daily.  Dispense: 90 tablet; Refill: 2  7. Diabetic polyneuropathy associated with type 2 diabetes mellitus (HCC) Controlled on Gabapentin - gabapentin (NEURONTIN) 300 MG capsule; Take 1 capsule (300 mg total) by mouth 2 (two) times daily.  Dispense: 60 capsule; Refill: 6  8. Depression, unspecified depression type Exacerbated by passing of her husband in 03/2019 - FLUoxetine (PROZAC) 20 MG tablet; Take 1 tablet (20 mg total) by mouth daily.  Dispense: 30 tablet; Refill: 6   Return in about 3 months (around 10/23/2019) for medical conditions.  50 minutes of total time spent in encounter and greater than 50% of total time spent on discussing diagnosis, investigations, treatment plan, counseling, coordination of care, administering insulin and monitoring for complications.  Charlott Rakes, MD, FAAFP. [facility], [place]   07/23/2019, 2:50 PM

## 2019-07-24 ENCOUNTER — Encounter: Payer: Self-pay | Admitting: Family Medicine

## 2019-07-24 LAB — CMP14+EGFR
ALT: 11 IU/L (ref 0–32)
AST: 12 IU/L (ref 0–40)
Albumin/Globulin Ratio: 1.7 (ref 1.2–2.2)
Albumin: 4.3 g/dL (ref 3.8–4.9)
Alkaline Phosphatase: 117 IU/L (ref 39–117)
BUN/Creatinine Ratio: 23 (ref 9–23)
BUN: 16 mg/dL (ref 6–24)
Bilirubin Total: 0.2 mg/dL (ref 0.0–1.2)
CO2: 24 mmol/L (ref 20–29)
Calcium: 10 mg/dL (ref 8.7–10.2)
Chloride: 99 mmol/L (ref 96–106)
Creatinine, Ser: 0.69 mg/dL (ref 0.57–1.00)
GFR calc Af Amer: 111 mL/min/{1.73_m2} (ref >59)
GFR calc non Af Amer: 96 mL/min/{1.73_m2} (ref >59)
Globulin, Total: 2.6 g/dL (ref 1.5–4.5)
Glucose: 352 mg/dL — ABNORMAL HIGH (ref 65–99)
Potassium: 4.3 mmol/L (ref 3.5–5.2)
Sodium: 138 mmol/L (ref 134–144)
Total Protein: 6.9 g/dL (ref 6.0–8.5)

## 2019-07-24 LAB — MICROALBUMIN / CREATININE URINE RATIO
Creatinine, Urine: 38.6 mg/dL
Microalb/Creat Ratio: <8 mg/g creat (ref 0–29)
Microalbumin, Urine: <3 ug/mL

## 2019-07-24 LAB — LIPID PANEL
Chol/HDL Ratio: 4.8 ratio — ABNORMAL HIGH (ref 0.0–4.4)
Cholesterol, Total: 242 mg/dL — ABNORMAL HIGH (ref 100–199)
HDL: 50 mg/dL (ref >39)
LDL Chol Calc (NIH): 150 mg/dL — ABNORMAL HIGH (ref 0–99)
Triglycerides: 228 mg/dL — ABNORMAL HIGH (ref 0–149)
VLDL Cholesterol Cal: 42 mg/dL — ABNORMAL HIGH (ref 5–40)

## 2019-07-24 LAB — HEMOGLOBIN A1C
Est. average glucose Bld gHb Est-mCnc: 275 mg/dL
Hgb A1c MFr Bld: 11.2 % — ABNORMAL HIGH (ref 4.8–5.6)

## 2019-07-26 ENCOUNTER — Telehealth: Payer: Self-pay

## 2019-07-26 NOTE — Telephone Encounter (Signed)
JANUMET PRIOR AUTH APPROVED THRU 07/24/2020

## 2019-07-27 ENCOUNTER — Telehealth: Payer: Self-pay

## 2019-07-27 ENCOUNTER — Other Ambulatory Visit: Payer: Medicaid Other

## 2019-07-27 MED ORDER — FLUOXETINE HCL 20 MG PO CAPS: 20.0000 mg | ORAL_CAPSULE | Freq: Every day | ORAL | 6 refills | Status: DC

## 2019-07-27 NOTE — Telephone Encounter (Signed)
-----   Message from [name], MD sent at 07/24/2019  5:29 PM EST ----- Cholesterol is elevated likely due to poor compliance.  Please advised to adhere to medications, low-cholesterol diet.

## 2019-07-27 NOTE — Telephone Encounter (Signed)
Patient name and DOB has been verified Patient was informed of lab results. Patient had no questions.  

## 2019-07-27 NOTE — Telephone Encounter (Signed)
Can you resend the Fluoxetine to [facility] for the capsules instead of the tablets.  Tablets are not preferred under Medicaid.

## 2019-07-27 NOTE — Telephone Encounter (Signed)
Done

## 2019-08-02 ENCOUNTER — Other Ambulatory Visit: Payer: Self-pay | Admitting: Family Medicine

## 2019-08-02 DIAGNOSIS — J328 Other chronic sinusitis: Secondary | ICD-10-CM

## 2019-08-20 ENCOUNTER — Ambulatory Visit: Payer: Medicaid Other | Attending: Family Medicine | Admitting: Physician Assistant

## 2019-08-20 ENCOUNTER — Other Ambulatory Visit: Payer: Self-pay

## 2019-08-20 VITALS — BP 146/83 | HR 80 | Temp 98.7°F | Ht 63.0 in | Wt 180.0 lb

## 2019-08-20 DIAGNOSIS — N390 Urinary tract infection, site not specified: Secondary | ICD-10-CM

## 2019-08-20 DIAGNOSIS — Z23 Encounter for immunization: Secondary | ICD-10-CM | POA: Diagnosis not present

## 2019-08-20 LAB — POCT URINALYSIS DIP (CLINITEK)
Bilirubin, UA: NEGATIVE
Blood, UA: NEGATIVE
Glucose, UA: >=1000 mg/dL — AB
Ketones, POC UA: NEGATIVE mg/dL
Leukocytes, UA: NEGATIVE
Nitrite, UA: POSITIVE — AB
POC PROTEIN,UA: 30 — AB
Spec Grav, UA: >=1.03 — AB (ref 1.010–1.025)
Urobilinogen, UA: 0.2 E.U./dL
pH, UA: 5.5 (ref 5.0–8.0)

## 2019-08-20 MED ORDER — SULFAMETHOXAZOLE-TRIMETHOPRIM 800-160 MG PO TABS: 1.0000 | ORAL_TABLET | Freq: Two times a day (BID) | ORAL | 0 refills | Status: DC

## 2019-08-20 NOTE — Patient Instructions (Signed)
I sent Bactrim to your pharmacy, you will take this twice a day for 5 days.  I sent your urine for culture, we will call you with abnormal results as we discussed.   You can use AZO OTC for pain if needed  Make sure to drinks lots of water!   Urinary Tract Infection, Adult  A urinary tract infection (UTI) is an infection of any part of the urinary tract. The urinary tract includes the kidneys, ureters, bladder, and urethra. These organs make, store, and get rid of urine in the body. Your health care provider may use other names to describe the infection. An upper UTI affects the ureters and kidneys (pyelonephritis). A lower UTI affects the bladder (cystitis) and urethra (urethritis). What are the causes? Most urinary tract infections are caused by bacteria in your genital area, around the entrance to your urinary tract (urethra). These bacteria grow and cause inflammation of your urinary tract. What increases the risk? You are more likely to develop this condition if:  You have a urinary catheter that stays in place (indwelling).  You are not able to control when you urinate or have a bowel movement (you have incontinence).  You are female and you: ? Use a spermicide or diaphragm for birth control. ? Have low estrogen levels. ? Are pregnant.  You have certain genes that increase your risk (genetics).  You are sexually active.  You take antibiotic medicines.  You have a condition that causes your flow of urine to slow down, such as: ? An enlarged prostate, if you are female. ? Blockage in your urethra (stricture). ? A kidney stone. ? A nerve condition that affects your bladder control (neurogenic bladder). ? Not getting enough to drink, or not urinating often.  You have certain medical conditions, such as: ? Diabetes. ? A weak disease-fighting system (immunesystem). ? Sickle cell disease. ? Gout. ? Spinal cord injury. What are the signs or symptoms? Symptoms of this  condition include:  Needing to urinate right away (urgently).  Frequent urination or passing small amounts of urine frequently.  Pain or burning with urination.  Blood in the urine.  Urine that smells bad or unusual.  Trouble urinating.  Cloudy urine.  Vaginal discharge, if you are female.  Pain in the abdomen or the lower back. You may also have:  Vomiting or a decreased appetite.  Confusion.  Irritability or tiredness.  A fever.  Diarrhea. The first symptom in older adults may be confusion. In some cases, they may not have any symptoms until the infection has worsened. How is this diagnosed? This condition is diagnosed based on your medical history and a physical exam. You may also have other tests, including:  Urine tests.  Blood tests.  Tests for sexually transmitted infections (STIs). If you have had more than one UTI, a cystoscopy or imaging studies may be done to determine the cause of the infections. How is this treated? Treatment for this condition includes:  Antibiotic medicine.  Over-the-counter medicines to treat discomfort.  Drinking enough water to stay hydrated. If you have frequent infections or have other conditions such as a kidney stone, you may need to see a health care provider who specializes in the urinary tract (urologist). In rare cases, urinary tract infections can cause sepsis. Sepsis is a life-threatening condition that occurs when the body responds to an infection. Sepsis is treated in the hospital with IV antibiotics, fluids, and other medicines. Follow these instructions at home:  Medicines  Take  over-the-counter and prescription medicines only as told by your health care provider.  If you were prescribed an antibiotic medicine, take it as told by your health care provider. Do not stop using the antibiotic even if you start to feel better. General instructions  Make sure you: ? Empty your bladder often and completely. Do not  hold urine for long periods of time. ? Empty your bladder after sex. ? Wipe from front to back after a bowel movement if you are female. Use each tissue one time when you wipe.  Drink enough fluid to keep your urine pale yellow.  Keep all follow-up visits as told by your health care provider. This is important. Contact a health care provider if:  Your symptoms do not get better after 1-2 days.  Your symptoms go away and then return. Get help right away if you have:  Severe pain in your back or your lower abdomen.  A fever.  Nausea or vomiting. Summary  A urinary tract infection (UTI) is an infection of any part of the urinary tract, which includes the kidneys, ureters, bladder, and urethra.  Most urinary tract infections are caused by bacteria in your genital area, around the entrance to your urinary tract (urethra).  Treatment for this condition often includes antibiotic medicines.  If you were prescribed an antibiotic medicine, take it as told by your health care provider. Do not stop using the antibiotic even if you start to feel better.  Keep all follow-up visits as told by your health care provider. This is important. This information is not intended to replace advice given to you by your health care provider. Make sure you discuss any questions you have with your health care provider. Document Revised: 04/27/2018 Document Reviewed: 11/17/2017 Elsevier Patient Education  2020 [employer].

## 2019-08-20 NOTE — Progress Notes (Signed)
Vaginal discomfort 1 week.

## 2019-08-20 NOTE — Progress Notes (Signed)
Acute Office Visit  Subjective:    Patient ID: Sarah Charles, female    DOB: December 10, 1960, 59 y.o.   MRN: [id number]  Chief Complaint  Patient presents with  . Urinary Tract Infection    HPI Increased urinary frequency, suprapubic discomfort and pain when wiping approximately 5 days ago.  Reports that she has had lower back pain, describes it as chronic, states it is more in the middle.  Has not tried anything for relief, does not drink much water.  Denies fever, nausea, vomiting, diarrhea, hematuria, vaginal discharge.    Past Medical History:  Diagnosis Date  . Arthritis   . Coronary artery calcification seen on CT scan    a. 07/2017 noted on CT abd.  . Depression   . GERD (gastroesophageal reflux disease)   . Hyperlipidemia   . Hypertension   . Obesity   . Sleep apnea    a. Does not use CPAP "cause i dont think I need it anymore".  . Tobacco abuse   . Type II diabetes mellitus ([place])     Past Surgical History:  Procedure Laterality Date  . CHOLECYSTECTOMY    . CORONARY STENT INTERVENTION N/A 11/04/2017   Procedure: CORONARY STENT INTERVENTION;  Surgeon: Martinique, Peter M, MD;  Location: [facility];  Service: Cardiovascular;  Laterality: N/A;  . INCISION AND DRAINAGE ABSCESS Left 12/22/2012   Procedure: INCISION AND DRAINAGE ABSCESS;  Surgeon: Jamesetta So, MD;  Location: [facility];  Service: General;  Laterality: Left;  . INTRAVASCULAR PRESSURE WIRE/FFR STUDY N/A 11/04/2017   Procedure: INTRAVASCULAR PRESSURE WIRE/FFR STUDY;  Surgeon: Martinique, Peter M, MD;  Location: [facility];  Service: Cardiovascular;  Laterality: N/A;  . LEFT HEART CATH AND CORONARY ANGIOGRAPHY N/A 11/04/2017   Procedure: LEFT HEART CATH AND CORONARY ANGIOGRAPHY;  Surgeon: Martinique, Peter M, MD;  Location: [facility];  Service: Cardiovascular;  Laterality: N/A;  . TONSILLECTOMY    . TUBAL LIGATION      Family History  Problem Relation Age of Onset  . Hypertension Mother   . CVA  Mother   . COPD Mother   . Heart disease Mother   . Kidney Stones Mother   . Heart attack Mother        MI in late 104's  . Breast cancer Mother   . CAD Father   . Hypercholesterolemia Father   . Heart attack Father        Died @ 52 of MI  . Cancer Maternal Uncle   . Diabetes Maternal Grandmother   . Cancer Maternal Grandfather        lung cancer  . Diabetes Sister     Social History   Socioeconomic History  . Marital status: Married    Spouse name: Not on file  . Number of children: Not on file  . Years of education: Not on file  . Highest education level: Not on file  Occupational History  . Not on file  Tobacco Use  . Smoking status: Former Smoker    Packs/day: 1.00    Years: 41.00    Pack years: 41.00    Types: Cigarettes    Quit date: 10/07/2018    Years since quitting: 0.8  . Smokeless tobacco: Never Used  Substance and Sexual Activity  . Alcohol use: No  . Drug use: No  . Sexual activity: Never    Birth control/protection: None  Other Topics Concern  . Not on file  Social History  Narrative   Lives in [place] with husband.  Unemployed.  Does not routinely exercise.   Social Determinants of Health   Financial Resource Strain:   . Difficulty of Paying Living Expenses:   Food Insecurity:   . Worried About Charity fundraiser in the Last Year:   . Arboriculturist in the Last Year:   Transportation Needs:   . Film/video editor (Medical):   Marland Kitchen Lack of Transportation (Non-Medical):   Physical Activity:   . Days of Exercise per Week:   . Minutes of Exercise per Session:   Stress:   . Feeling of Stress :   Social Connections:   . Frequency of Communication with Friends and Family:   . Frequency of Social Gatherings with Friends and Family:   . Attends Religious Services:   . Active Member of Clubs or Organizations:   . Attends Archivist Meetings:   Marland Kitchen Marital Status:   Intimate Partner Violence:   . Fear of Current or Ex-Partner:   .  Emotionally Abused:   Marland Kitchen Physically Abused:   . Sexually Abused:     Outpatient Medications Prior to Visit  Medication Sig Dispense Refill  . amLODipine (NORVASC) 2.5 MG tablet Take 1 tablet (2.5 mg total) by mouth daily. 180 tablet 3  . aspirin EC 81 MG tablet Take 81 mg by mouth daily.    . Blood Glucose Monitoring Suppl (ACCU-CHEK GUIDE ME) w/Device KIT 1 each by Does not apply route 3 (three) times daily. 1 kit 0  . buPROPion (WELLBUTRIN SR) 150 MG 12 hr tablet Take 1 tablet (150 mg total) by mouth 2 (two) times daily. 60 tablet 2  . cetirizine (ZYRTEC) 10 MG tablet TAKE 1 TABLET BY MOUTH EVERY DAY 30 tablet 2  . clopidogrel (PLAVIX) 75 MG tablet Take 1 tablet (75 mg total) by mouth daily. 90 tablet 2  . dicyclomine (BENTYL) 10 MG capsule Take 1 capsule by mouth daily.    . fenofibrate (TRICOR) 145 MG tablet Take 1 tablet (145 mg total) by mouth daily. 90 tablet 3  . FLUoxetine (PROZAC) 20 MG capsule Take 1 capsule (20 mg total) by mouth daily. 30 capsule 6  . gabapentin (NEURONTIN) 300 MG capsule Take 1 capsule (300 mg total) by mouth 2 (two) times daily. 60 capsule 6  . glucose blood (ACCU-CHEK GUIDE) test strip Use as instructed 100 each 12  . hydrOXYzine (ATARAX/VISTARIL) 25 MG tablet Take 1 tablet (25 mg total) by mouth 3 (three) times daily as needed. 60 tablet 3  . Insulin Glargine (LANTUS SOLOSTAR) 100 UNIT/ML Solostar Pen Inject 50 Units into the skin at bedtime. 30 mL 3  . Insulin Pen Needle (PEN NEEDLES) 31G X 5 MM MISC 1 each by Does not apply route daily. 100 each 11  . Lancets Misc. (ACCU-CHEK FASTCLIX LANCET) KIT As directed 1 kit 0  . lisinopril (ZESTRIL) 20 MG tablet Take 1 tablet (20 mg total) by mouth daily. 30 tablet 6  . methocarbamol (ROBAXIN) 500 MG tablet TAKE 1 TABLET BY MOUTH 3 TIMES DAILY. 90 tablet 0  . metoCLOPramide (REGLAN) 10 MG tablet TAKE 1 TABLET BY MOUTH EVERY EIGHT HOURS AS NEEDED FOR NAUSEA OR VOMITING. 40 tablet 0  . Multiple Vitamin (MULTIVITAMIN)  tablet Take 1 tablet by mouth daily.    . nitroGLYCERIN (NITROSTAT) 0.4 MG SL tablet Place 0.4 mg under the tongue every 5 (five) minutes as needed for chest pain.    . pantoprazole (  PROTONIX) 40 MG tablet Take 1 tablet (40 mg total) by mouth daily. 90 tablet 3  . rosuvastatin (CRESTOR) 40 MG tablet Take 1 tablet (40 mg total) by mouth daily. 90 tablet 2  . sitaGLIPtin-metformin (JANUMET) 50-1000 MG tablet Take 1 tablet by mouth 2 (two) times daily with a meal. 180 tablet 1  . traZODone (DESYREL) 50 MG tablet Take 1 tablet (50 mg total) by mouth at bedtime as needed for sleep. 30 tablet 2  . vitamin B-12 (CYANOCOBALAMIN) 500 MCG tablet Take 500 mcg by mouth daily.    . vitamin C (ASCORBIC ACID) 500 MG tablet Take 500 mg by mouth daily.    . metoprolol tartrate (LOPRESSOR) 25 MG tablet Take 1 tablet (25 mg total) by mouth 2 (two) times daily for 30 days. 60 tablet 12  . cephALEXin (KEFLEX) 500 MG capsule Take 1 capsule (500 mg total) by mouth 2 (two) times daily. 14 capsule 0   No facility-administered medications prior to visit.    Allergies  Allergen Reactions  . Cymbalta [Duloxetine Hcl] Nausea Only    Nausea, lack of therapeutic effect    Review of Systems  Constitutional: Negative for chills, fatigue and fever.  HENT: Negative.   Eyes: Negative.   Respiratory: Negative.   Cardiovascular: Negative.   Gastrointestinal: Positive for abdominal pain. Negative for constipation, diarrhea, nausea and vomiting.  Endocrine: Negative.   Genitourinary: Positive for frequency, pelvic pain and urgency. Negative for difficulty urinating and hematuria.  Musculoskeletal: Positive for back pain.  Skin: Negative.   Neurological: Negative.   Hematological: Negative.   Psychiatric/Behavioral: Negative.        Objective:    Physical Exam Vitals reviewed.  Constitutional:      Appearance: Normal appearance. She is obese.  HENT:     Head: Normocephalic and atraumatic.     Right Ear: External  ear normal.     Left Ear: External ear normal.     Mouth/Throat:     Mouth: Mucous membranes are dry.     Pharynx: Oropharynx is clear.  Eyes:     Extraocular Movements: Extraocular movements intact.     Conjunctiva/sclera: Conjunctivae normal.     Pupils: Pupils are equal, round, and reactive to light.  Cardiovascular:     Rate and Rhythm: Normal rate and regular rhythm.     Pulses: Normal pulses.     Heart sounds: Normal heart sounds.  Pulmonary:     Effort: Pulmonary effort is normal.  Abdominal:     Tenderness: There is abdominal tenderness. There is no right CVA tenderness or left CVA tenderness.     Comments: Slight suprapubic tenderness noted  Musculoskeletal:        General: Normal range of motion.     Cervical back: Normal range of motion and neck supple.  Skin:    General: Skin is warm and dry.  Neurological:     General: No focal deficit present.     Mental Status: She is alert and oriented to person, place, and time.  Psychiatric:        Mood and Affect: Mood normal.        Behavior: Behavior normal.        Thought Content: Thought content normal.        Judgment: Judgment normal.     BP (!) 146/83 (BP Location: Left Arm, Patient Position: Sitting, Cuff Size: Normal)   Pulse 80   Temp 98.7 F (37.1 C) (Oral)   Ht 5'  3" (1.6 m)   Wt 180 lb (81.6 kg)   BMI 31.89 kg/m  Wt Readings from Last 3 Encounters:  08/20/19 180 lb (81.6 kg)  07/23/19 165 lb (74.8 kg)  06/29/19 181 lb 12.8 oz (82.5 kg)    Health Maintenance Due  Topic Date Due  . OPHTHALMOLOGY EXAM  Never done  . COLONOSCOPY  Never done  . FOOT EXAM  08/11/2017  . PAP SMEAR-Modifier  01/13/2019    There are no preventive care reminders to display for this patient.   Lab Results  Component Value Date   TSH 2.870 03/23/2018   Lab Results  Component Value Date   WBC 6.5 11/09/2018   HGB 14.2 11/09/2018   HCT 42.8 11/09/2018   MCV 93 11/09/2018   PLT 321 11/09/2018   Lab Results    Component Value Date   NA 138 07/23/2019   K 4.3 07/23/2019   CO2 24 07/23/2019   GLUCOSE 352 (H) 07/23/2019   BUN 16 07/23/2019   CREATININE 0.69 07/23/2019   BILITOT 0.2 07/23/2019   ALKPHOS 117 07/23/2019   AST 12 07/23/2019   ALT 11 07/23/2019   PROT 6.9 07/23/2019   ALBUMIN 4.3 07/23/2019   CALCIUM 10.0 07/23/2019   ANIONGAP 10 10/29/2017   Lab Results  Component Value Date   CHOL 242 (H) 07/23/2019   Lab Results  Component Value Date   HDL 50 07/23/2019   Lab Results  Component Value Date   LDLCALC 150 (H) 07/23/2019   Lab Results  Component Value Date   TRIG 228 (H) 07/23/2019   Lab Results  Component Value Date   CHOLHDL 4.8 (H) 07/23/2019   Lab Results  Component Value Date   HGBA1C 11.2 (H) 07/23/2019       Assessment & Plan:   Problem List Items Addressed This Visit    None    Visit Diagnoses    Urinary tract infection without hematuria, site unspecified    -  Primary   Relevant Medications   sulfamethoxazole-trimethoprim (BACTRIM DS) 800-160 MG tablet   Other Relevant Orders   POCT URINALYSIS DIP (CLINITEK) (Completed)   Urine Culture     1. Urinary tract infection without hematuria, site unspecified UA positive for glucose, protein, nitrites, patient is symptomatic, but not septic.  Will treat with Bactrim, request urine culture, encouraged patient to push fluids. - POCT URINALYSIS DIP (CLINITEK) - Urine Culture - sulfamethoxazole-trimethoprim (BACTRIM DS) 800-160 MG tablet; Take 1 tablet by mouth 2 (two) times daily.  Dispense: 10 tablet; Refill: 0   Meds ordered this encounter  Medications  . sulfamethoxazole-trimethoprim (BACTRIM DS) 800-160 MG tablet    Sig: Take 1 tablet by mouth 2 (two) times daily.    Dispense:  10 tablet    Refill:  0    Order Specific Question:   Supervising Provider    Answer:   Asencion Noble E [[id number]]     Samaiyah Howes Chauncey Cruel Mayers, PA-C

## 2019-08-23 LAB — URINE CULTURE

## 2019-08-28 ENCOUNTER — Other Ambulatory Visit: Payer: Self-pay | Admitting: Physician Assistant

## 2019-08-28 DIAGNOSIS — F329 Major depressive disorder, single episode, unspecified: Secondary | ICD-10-CM

## 2019-08-28 DIAGNOSIS — F32A Depression, unspecified: Secondary | ICD-10-CM

## 2019-09-07 ENCOUNTER — Ambulatory Visit: Payer: Medicaid Other

## 2019-09-14 ENCOUNTER — Ambulatory Visit: Payer: Medicaid Other | Attending: Internal Medicine

## 2019-09-14 DIAGNOSIS — Z23 Encounter for immunization: Secondary | ICD-10-CM

## 2019-09-14 NOTE — Progress Notes (Signed)
   Covid-19 Vaccination Clinic  Name:  Noreene Boreman    MRN: [id number] DOB: December 19, 1960  09/14/2019  Ms. Foisy was observed post Covid-19 immunization for 15 minutes without incident. She was provided with Vaccine Information Sheet and instruction to access the V-Safe system.   Ms. Amer was instructed to call 911 with any severe reactions post vaccine: Marland Kitchen Difficulty breathing  . Swelling of face and throat  . A fast heartbeat  . A bad rash all over body  . Dizziness and weakness   Immunizations Administered    Name Date Dose VIS Date Route   Pfizer COVID-19 Vaccine 09/14/2019 10:50 AM 0.3 mL 07/18/2018 Intramuscular   Manufacturer: [employer], [employer]   Lot: [id number]   NDC: [address]-5

## 2019-10-08 ENCOUNTER — Ambulatory Visit: Payer: Medicaid Other

## 2019-10-11 ENCOUNTER — Ambulatory Visit: Payer: Medicaid Other | Attending: Internal Medicine

## 2019-10-11 DIAGNOSIS — Z23 Encounter for immunization: Secondary | ICD-10-CM

## 2019-10-11 NOTE — Progress Notes (Signed)
   Covid-19 Vaccination Clinic  Name:  Sarah Charles    MRN: [id number] DOB: [birth year]  10/11/2019  [name] was observed post Covid-19 immunization for 15 minutes without incident. She was provided with Vaccine Information Sheet and instruction to access the V-Safe system.   [name] was instructed to call 911 with any severe reactions post vaccine: Marland Kitchen Difficulty breathing  . Swelling of face and throat  . A fast heartbeat  . A bad rash all over body  . Dizziness and weakness   Immunizations Administered    Name Date Dose VIS Date Route   Pfizer COVID-19 Vaccine 10/11/2019  9:32 AM 0.3 mL 07/18/2018 Intramuscular   Manufacturer: [employer], [employer]   Lot: [id number]   NDC: [address]-6

## 2019-10-13 NOTE — Progress Notes (Signed)
Erroneous encounter

## 2019-10-17 ENCOUNTER — Other Ambulatory Visit: Payer: Self-pay

## 2019-10-17 ENCOUNTER — Ambulatory Visit: Payer: Medicaid Other | Attending: Family Medicine | Admitting: Physician Assistant

## 2019-10-17 DIAGNOSIS — L602 Onychogryphosis: Secondary | ICD-10-CM

## 2019-10-17 DIAGNOSIS — E1165 Type 2 diabetes mellitus with hyperglycemia: Secondary | ICD-10-CM

## 2019-10-17 DIAGNOSIS — Z794 Long term (current) use of insulin: Secondary | ICD-10-CM

## 2019-10-17 MED ORDER — ACCU-CHEK GUIDE VI STRP: ORAL_STRIP | 12 refills | Status: DC

## 2019-10-17 MED FILL — ACCU-CHEK GUIDE TEST STRIP: 25 days supply | Qty: 100 | Fill #0

## 2019-10-17 NOTE — Progress Notes (Signed)
Virtual Visit via Telephone Note  I connected with Sarah Charles on 10/17/19 at  1:30 PM EDT by telephone and verified that I am speaking with the correct person using two identifiers.   I discussed the limitations, risks, security and privacy concerns of performing an evaluation and management service by telephone and the availability of in person appointments. I also discussed with the patient that there may be a patient responsible charge related to this service. The patient expressed understanding and agreed to proceed.  PATIENT visit by telephone virtually in the context of Covid-19 pandemic. Patient location:  home My Location:  [facility] office Persons on the call:  Me and the patient   History of Present Illness:  She has thickened toenails and is unable to trim them.  She has not been checking her sugars.  Says she needs test strips.  She says she is taking her diabetes meds and denies polydipsia/polyuria    Observations/Objective: NAD.  A&Ox3   Assessment and Plan: 1. Thickened nails - Ambulatory referral to Podiatry  2. Type 2 diabetes mellitus with hyperglycemia, with long-term current use of insulin (HCC) Take all meds as directed and follow diabetic diet - glucose blood (ACCU-CHEK GUIDE) test strip; Use as instructed  Dispense: 100 each; Refill: 12    Follow Up Instructions: Keep June appt with PCP   I discussed the assessment and treatment plan with the patient. The patient was provided an opportunity to ask questions and all were answered. The patient agreed with the plan and demonstrated an understanding of the instructions.   The patient was advised to call back or seek an in-person evaluation if the symptoms worsen or if the condition fails to improve as anticipated.  I provided 9 minutes of non-face-to-face time during this encounter.   Georgian Co, PA-C  Patient ID: Sarah Charles, female   DOB: 12-Jan-1961, 58 y.o.   MRN: [id number]

## 2019-10-23 ENCOUNTER — Ambulatory Visit: Payer: Medicaid Other | Admitting: Family Medicine

## 2019-10-24 ENCOUNTER — Ambulatory Visit: Payer: Medicaid Other | Admitting: Family Medicine

## 2019-11-01 DIAGNOSIS — L239 Allergic contact dermatitis, unspecified cause: Secondary | ICD-10-CM | POA: Diagnosis not present

## 2019-11-01 DIAGNOSIS — N390 Urinary tract infection, site not specified: Secondary | ICD-10-CM | POA: Diagnosis not present

## 2019-11-01 DIAGNOSIS — R358 Other polyuria: Secondary | ICD-10-CM | POA: Diagnosis not present

## 2019-11-01 DIAGNOSIS — R300 Dysuria: Secondary | ICD-10-CM | POA: Diagnosis not present

## 2019-11-07 ENCOUNTER — Ambulatory Visit: Payer: Medicaid Other | Admitting: Family Medicine

## 2019-11-09 ENCOUNTER — Other Ambulatory Visit: Payer: Self-pay

## 2019-11-09 ENCOUNTER — Ambulatory Visit (INDEPENDENT_AMBULATORY_CARE_PROVIDER_SITE_OTHER): Payer: Medicaid Other | Admitting: Podiatry

## 2019-11-09 DIAGNOSIS — M79674 Pain in right toe(s): Secondary | ICD-10-CM | POA: Diagnosis not present

## 2019-11-09 DIAGNOSIS — B351 Tinea unguium: Secondary | ICD-10-CM

## 2019-11-09 DIAGNOSIS — M79675 Pain in left toe(s): Secondary | ICD-10-CM

## 2019-11-09 DIAGNOSIS — E1142 Type 2 diabetes mellitus with diabetic polyneuropathy: Secondary | ICD-10-CM | POA: Diagnosis not present

## 2019-11-13 ENCOUNTER — Encounter: Payer: Self-pay | Admitting: Podiatry

## 2019-11-13 NOTE — Progress Notes (Signed)
  Subjective:  Patient ID: Sarah Charles, female    DOB: 1961-02-19,  MRN: [id number]  Chief Complaint  Patient presents with  . Nail Problem    the toenail is missing    59 y.o. female returns for the above complaint.  Patient presents with thickened elongated dystrophic toenails x10.  Patient states that she is a diabetic that has been going on for 2 to 3 years.  She does not know her last A1c.  There is no swelling or coldness associated.  They are painful to touch.  She would like to have them debrided down as she is not able to debride down herself.  She denies any other acute complaints.  She has not seen anyone else prior to seeing me.  Objective:  There were no vitals filed for this visit. Podiatric Exam: Vascular: dorsalis pedis and posterior tibial pulses are palpable bilateral. Capillary return is immediate. Temperature gradient is WNL. Skin turgor WNL  Sensorium: Normal Semmes Weinstein monofilament test. Normal tactile sensation bilaterally. Nail Exam: Pt has thick disfigured discolored nails with subungual debris noted bilateral entire nail hallux through fifth toenails.  Pain on palpation to the nails. Ulcer Exam: There is no evidence of ulcer or pre-ulcerative changes or infection. Orthopedic Exam: Muscle tone and strength are WNL. No limitations in general ROM. No crepitus or effusions noted. HAV  B/L.  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 Skin: No Porokeratosis. No infection or ulcers    Assessment & Plan:   1. Pain due to onychomycosis of toenails of both feet   2. Diabetic polyneuropathy associated with type 2 diabetes mellitus (HCC)     Patient was evaluated and treated and all questions answered.  Onychomycosis with pain  -Nails palliatively debrided as below. -Educated on self-care  Procedure: Nail Debridement Rationale: pain  Type of Debridement: manual, Cerutti debridement. Instrumentation: Nail nipper, rotary burr. Number of Nails: 10  Procedures and Treatment: Consent by  patient was obtained for treatment procedures. The patient understood the discussion of treatment and procedures well. All questions were answered thoroughly reviewed. Debridement of mycotic and hypertrophic toenails, 1 through 5 bilateral and clearing of subungual debris. No ulceration, no infection noted.  Return Visit-Office Procedure: Patient instructed to return to the office for a follow up visit 3 months for continued evaluation and treatment.  Nicholes Rough, DPM    No follow-ups on file.

## 2019-11-27 ENCOUNTER — Other Ambulatory Visit: Payer: Self-pay | Admitting: Physician Assistant

## 2019-11-27 DIAGNOSIS — Z794 Long term (current) use of insulin: Secondary | ICD-10-CM

## 2019-11-27 DIAGNOSIS — E1165 Type 2 diabetes mellitus with hyperglycemia: Secondary | ICD-10-CM

## 2019-11-27 MED FILL — LANTUS SOLOSTAR 100 UNITS/M: 100 | 33 days supply | Qty: 15 | Fill #0

## 2019-12-10 ENCOUNTER — Emergency Department (HOSPITAL_COMMUNITY)
Admission: EM | Admit: 2019-12-10 | Discharge: 2019-12-11 | Disposition: A | Discharge status: Home / Self Care | Payer: Medicaid Other | Attending: Emergency Medicine | Admitting: Emergency Medicine

## 2019-12-10 ENCOUNTER — Other Ambulatory Visit: Payer: Self-pay

## 2019-12-10 ENCOUNTER — Encounter: Payer: Self-pay | Admitting: Family Medicine

## 2019-12-10 ENCOUNTER — Ambulatory Visit: Payer: Medicaid Other | Attending: Family Medicine | Admitting: Family Medicine

## 2019-12-10 ENCOUNTER — Encounter (HOSPITAL_COMMUNITY): Payer: Self-pay | Admitting: Emergency Medicine

## 2019-12-10 VITALS — BP 126/76 | HR 97 | Ht 63.0 in | Wt 160.0 lb

## 2019-12-10 DIAGNOSIS — Z955 Presence of coronary angioplasty implant and graft: Secondary | ICD-10-CM | POA: Insufficient documentation

## 2019-12-10 DIAGNOSIS — B3731 Acute candidiasis of vulva and vagina: Secondary | ICD-10-CM

## 2019-12-10 DIAGNOSIS — I2510 Atherosclerotic heart disease of native coronary artery without angina pectoris: Secondary | ICD-10-CM | POA: Insufficient documentation

## 2019-12-10 DIAGNOSIS — Z9114 Patient's other noncompliance with medication regimen: Secondary | ICD-10-CM | POA: Diagnosis not present

## 2019-12-10 DIAGNOSIS — E1165 Type 2 diabetes mellitus with hyperglycemia: Secondary | ICD-10-CM

## 2019-12-10 DIAGNOSIS — Z79899 Other long term (current) drug therapy: Secondary | ICD-10-CM | POA: Diagnosis not present

## 2019-12-10 DIAGNOSIS — Z87891 Personal history of nicotine dependence: Secondary | ICD-10-CM | POA: Insufficient documentation

## 2019-12-10 DIAGNOSIS — R739 Hyperglycemia, unspecified: Secondary | ICD-10-CM | POA: Diagnosis not present

## 2019-12-10 DIAGNOSIS — Z803 Family history of malignant neoplasm of breast: Secondary | ICD-10-CM | POA: Insufficient documentation

## 2019-12-10 DIAGNOSIS — B373 Candidiasis of vulva and vagina: Secondary | ICD-10-CM | POA: Diagnosis not present

## 2019-12-10 DIAGNOSIS — I10 Essential (primary) hypertension: Secondary | ICD-10-CM | POA: Diagnosis not present

## 2019-12-10 DIAGNOSIS — Z794 Long term (current) use of insulin: Secondary | ICD-10-CM

## 2019-12-10 DIAGNOSIS — N3000 Acute cystitis without hematuria: Secondary | ICD-10-CM | POA: Insufficient documentation

## 2019-12-10 LAB — CBC WITH DIFFERENTIAL/PLATELET
Abs Immature Granulocytes: 0.02 K/uL (ref 0.00–0.07)
Basophils Absolute: 0.1 K/uL (ref 0.0–0.1)
Basophils Relative: 1 %
Eosinophils Absolute: 0.1 K/uL (ref 0.0–0.5)
Eosinophils Relative: 1 %
HCT: 47.5 % — ABNORMAL HIGH (ref 36.0–46.0)
Hemoglobin: 16 g/dL — ABNORMAL HIGH (ref 12.0–15.0)
Immature Granulocytes: 0 %
Lymphocytes Relative: 33 %
Lymphs Abs: 2.9 K/uL (ref 0.7–4.0)
MCH: 30 pg (ref 26.0–34.0)
MCHC: 33.7 g/dL (ref 30.0–36.0)
MCV: 89 fL (ref 80.0–100.0)
Monocytes Absolute: 0.6 K/uL (ref 0.1–1.0)
Monocytes Relative: 7 %
Neutro Abs: 5.1 K/uL (ref 1.7–7.7)
Neutrophils Relative %: 58 %
Platelets: 382 K/uL (ref 150–400)
RBC: 5.34 MIL/uL — ABNORMAL HIGH (ref 3.87–5.11)
RDW: 12.8 % (ref 11.5–15.5)
WBC: 8.9 K/uL (ref 4.0–10.5)
nRBC: 0 % (ref 0.0–0.2)

## 2019-12-10 LAB — COMPREHENSIVE METABOLIC PANEL WITH GFR
ALT: 13 U/L (ref 0–44)
AST: 12 U/L — ABNORMAL LOW (ref 15–41)
Albumin: 3.6 g/dL (ref 3.5–5.0)
Alkaline Phosphatase: 119 U/L (ref 38–126)
Anion gap: 14 (ref 5–15)
BUN: 18 mg/dL (ref 6–20)
CO2: 23 mmol/L (ref 22–32)
Calcium: 10.1 mg/dL (ref 8.9–10.3)
Chloride: 95 mmol/L — ABNORMAL LOW (ref 98–111)
Creatinine, Ser: 0.78 mg/dL (ref 0.44–1.00)
GFR calc Af Amer: >60 mL/min
GFR calc non Af Amer: >60 mL/min
Glucose, Bld: 537 mg/dL (ref 70–99)
Potassium: 4.4 mmol/L (ref 3.5–5.1)
Sodium: 132 mmol/L — ABNORMAL LOW (ref 135–145)
Total Bilirubin: 0.8 mg/dL (ref 0.3–1.2)
Total Protein: 7.2 g/dL (ref 6.5–8.1)

## 2019-12-10 LAB — GLUCOSE, POCT (MANUAL RESULT ENTRY): POC Glucose: 503 mg/dl — AB (ref 70–99)

## 2019-12-10 LAB — POCT GLYCOSYLATED HEMOGLOBIN (HGB A1C): HbA1c, POC (controlled diabetic range): 11.1 % — AB (ref 0.0–7.0)

## 2019-12-10 LAB — CBG MONITORING, ED: Glucose-Capillary: 517 mg/dL (ref 70–99)

## 2019-12-10 NOTE — Progress Notes (Signed)
Subjective:  Patient ID: Sarah Charles, female    DOB: [birth year]  Age: 59 y.o. MRN: [id number]  CC: Diabetes   HPI Marybel Alcott is a 59 year old female with a history of type 2 diabetes mellitus (A1c 11.2), noncompliance, hypertension, hyperlipidemia, GERD, CAD (s/p DES)who presents today fora follow-up visit. Last visit to Cardiology was in 06/2019. She was seen by Podiatry in 10/2019 and underwent debridement of her toenails  Her blood sugar is 503 and she has been out of her insulin. States she got thrown out of her place of residence.  Also states she has a lot going on.  She has no abdominal pain, headache, blurry vision. Past Medical History:  Diagnosis Date  . Arthritis   . Coronary artery calcification seen on CT scan    a. 07/2017 noted on CT abd.  . Depression   . GERD (gastroesophageal reflux disease)   . Hyperlipidemia   . Hypertension   . Obesity   . Sleep apnea    a. Does not use CPAP "cause i dont think I need it anymore".  . Tobacco abuse   . Type II diabetes mellitus ([place])     Past Surgical History:  Procedure Laterality Date  . CHOLECYSTECTOMY    . CORONARY STENT INTERVENTION N/A 11/04/2017   Procedure: CORONARY STENT INTERVENTION;  Surgeon: Martinique, Peter M, MD;  Location: [facility];  Service: Cardiovascular;  Laterality: N/A;  . INCISION AND DRAINAGE ABSCESS Left 12/22/2012   Procedure: INCISION AND DRAINAGE ABSCESS;  Surgeon: Jamesetta So, MD;  Location: [facility];  Service: General;  Laterality: Left;  . INTRAVASCULAR PRESSURE WIRE/FFR STUDY N/A 11/04/2017   Procedure: INTRAVASCULAR PRESSURE WIRE/FFR STUDY;  Surgeon: Martinique, Peter M, MD;  Location: [facility];  Service: Cardiovascular;  Laterality: N/A;  . LEFT HEART CATH AND CORONARY ANGIOGRAPHY N/A 11/04/2017   Procedure: LEFT HEART CATH AND CORONARY ANGIOGRAPHY;  Surgeon: Martinique, Peter M, MD;  Location: [facility];  Service: Cardiovascular;  Laterality: N/A;  . TONSILLECTOMY    .  TUBAL LIGATION      Family History  Problem Relation Age of Onset  . Hypertension Mother   . CVA Mother   . COPD Mother   . Heart disease Mother   . Kidney Stones Mother   . Heart attack Mother        MI in late 20's  . Breast cancer Mother   . CAD Father   . Hypercholesterolemia Father   . Heart attack Father        Died @ 56 of MI  . Cancer Maternal Uncle   . Diabetes Maternal Grandmother   . Cancer Maternal Grandfather        lung cancer  . Diabetes Sister     Allergies  Allergen Reactions  . Cymbalta [Duloxetine Hcl] Nausea Only    Nausea, lack of therapeutic effect    Outpatient Medications Prior to Visit  Medication Sig Dispense Refill  . aspirin EC 81 MG tablet Take 81 mg by mouth daily.    . Blood Glucose Monitoring Suppl (ACCU-CHEK GUIDE ME) w/Device KIT 1 each by Does not apply route 3 (three) times daily. 1 kit 0  . buPROPion (WELLBUTRIN SR) 150 MG 12 hr tablet TAKE 1 TABLET TWICE DAILY 60 tablet 2  . cetirizine (ZYRTEC) 10 MG tablet TAKE 1 TABLET BY MOUTH EVERY DAY 30 tablet 2  . clopidogrel (PLAVIX) 75 MG tablet Take 1 tablet (75 mg total) by  mouth daily. 90 tablet 2  . dicyclomine (BENTYL) 10 MG capsule Take 1 capsule by mouth daily.    . fenofibrate (TRICOR) 145 MG tablet Take 1 tablet (145 mg total) by mouth daily. 90 tablet 3  . FLUoxetine (PROZAC) 20 MG capsule Take 1 capsule (20 mg total) by mouth daily. 30 capsule 6  . gabapentin (NEURONTIN) 300 MG capsule Take 1 capsule (300 mg total) by mouth 2 (two) times daily. 60 capsule 6  . glucose blood (ACCU-CHEK GUIDE) test strip Use as instructed 100 each 12  . hydrOXYzine (ATARAX/VISTARIL) 25 MG tablet Take 1 tablet (25 mg total) by mouth 3 (three) times daily as needed. 60 tablet 3  . Insulin Pen Needle (PEN NEEDLES) 31G X 5 MM MISC 1 each by Does not apply route daily. 100 each 11  . Lancets Misc. (ACCU-CHEK FASTCLIX LANCET) KIT As directed 1 kit 0  . LANTUS SOLOSTAR 100 UNIT/ML Solostar Pen INJECT 45  UNITS INTO THE SKIN AT BEDTIME. 15 mL 0  . lisinopril (ZESTRIL) 20 MG tablet Take 1 tablet (20 mg total) by mouth daily. 30 tablet 6  . methocarbamol (ROBAXIN) 500 MG tablet TAKE 1 TABLET BY MOUTH 3 TIMES DAILY. 90 tablet 0  . metoCLOPramide (REGLAN) 10 MG tablet TAKE 1 TABLET BY MOUTH EVERY EIGHT HOURS AS NEEDED FOR NAUSEA OR VOMITING. 40 tablet 0  . Multiple Vitamin (MULTIVITAMIN) tablet Take 1 tablet by mouth daily.    . nitroGLYCERIN (NITROSTAT) 0.4 MG SL tablet Place 0.4 mg under the tongue every 5 (five) minutes as needed for chest pain.    . pantoprazole (PROTONIX) 40 MG tablet Take 1 tablet (40 mg total) by mouth daily. 90 tablet 3  . rosuvastatin (CRESTOR) 40 MG tablet Take 1 tablet (40 mg total) by mouth daily. 90 tablet 2  . sitaGLIPtin-metformin (JANUMET) 50-1000 MG tablet Take 1 tablet by mouth 2 (two) times daily with a meal. 180 tablet 1  . sulfamethoxazole-trimethoprim (BACTRIM DS) 800-160 MG tablet Take 1 tablet by mouth 2 (two) times daily. 10 tablet 0  . traZODone (DESYREL) 50 MG tablet Take 1 tablet (50 mg total) by mouth at bedtime as needed for sleep. 30 tablet 2  . vitamin B-12 (CYANOCOBALAMIN) 500 MCG tablet Take 500 mcg by mouth daily.    . vitamin C (ASCORBIC ACID) 500 MG tablet Take 500 mg by mouth daily.    Marland Kitchen amLODipine (NORVASC) 2.5 MG tablet Take 1 tablet (2.5 mg total) by mouth daily. 180 tablet 3  . metoprolol tartrate (LOPRESSOR) 25 MG tablet Take 1 tablet (25 mg total) by mouth 2 (two) times daily for 30 days. 60 tablet 12   No facility-administered medications prior to visit.     ROS Review of Systems  Constitutional: Negative for activity change, appetite change and fatigue.  HENT: Negative for congestion, sinus pressure and sore throat.   Eyes: Negative for visual disturbance.  Respiratory: Negative for cough, chest tightness, shortness of breath and wheezing.   Cardiovascular: Negative for chest pain and palpitations.  Gastrointestinal: Negative for  abdominal distention, abdominal pain and constipation.  Endocrine: Negative for polydipsia.  Genitourinary: Negative for dysuria and frequency.  Musculoskeletal: Negative for arthralgias and back pain.  Skin: Negative for rash.  Neurological: Negative for tremors, light-headedness and numbness.  Hematological: Does not bruise/bleed easily.  Psychiatric/Behavioral: Negative for agitation and behavioral problems.    Objective:  BP 126/76   Pulse 97   Ht _0  (1.6 m)   Wt 160  lb (72.6 kg)   SpO2 97%   BMI 28.34 kg/m   BP/Weight 12/10/2019 2/35/3614 08/25/1538  Systolic BP 086 761 950  Diastolic BP 76 83 76  Wt. (Lbs) 160 180 165  BMI 28.34 31.89 29.23      Physical Exam Constitutional:      Appearance: She is well-developed.  Neck:     Vascular: No JVD.  Cardiovascular:     Rate and Rhythm: Normal rate.     Heart sounds: Normal heart sounds. No murmur heard.   Pulmonary:     Effort: Pulmonary effort is normal.     Breath sounds: Normal breath sounds. No wheezing or rales.  Chest:     Chest wall: No tenderness.  Abdominal:     General: Bowel sounds are normal. There is no distension.     Palpations: Abdomen is soft. There is no mass.     Tenderness: There is no abdominal tenderness.  Musculoskeletal:        General: Normal range of motion.     Right lower leg: No edema.     Left lower leg: No edema.  Neurological:     Mental Status: She is alert and oriented to person, place, and time.  Psychiatric:        Mood and Affect: Mood normal.     CMP Latest Ref Rng & Units 07/23/2019 11/09/2018 03/23/2018  Glucose 65 - 99 mg/dL 352(H) 552(HH) 194(H)  BUN 6 - 24 mg/dL _0 Creatinine 0.57 - 1.00 mg/dL 0.69 0.68 0.60  Sodium 134 - 144 mmol/L 138 136 138  Potassium 3.5 - 5.2 mmol/L 4.3 4.6 4.3  Chloride 96 - 106 mmol/L 99 98 97  CO2 20 - 29 mmol/L _1 Calcium 8.7 - 10.2 mg/dL 10.0 9.6 10.0  Total Protein 6.0 - 8.5 g/dL 6.9 6.6 6.6  Total Bilirubin 0.0 - 1.2  mg/dL 0.2 0.2 <0.2  Alkaline Phos 39 - 117 IU/L 117 144(H) 118(H)  AST 0 - 40 IU/L _2 ALT 0 - 32 IU/L _3 Lipid Panel     Component Value Date/Time   CHOL 242 (H) 07/23/2019 1600   TRIG 228 (H) 07/23/2019 1600   HDL 50 07/23/2019 1600   CHOLHDL 4.8 (H) 07/23/2019 1600   CHOLHDL 9.7 (H) 07/23/2016 1638   VLDL NOT CALC 07/23/2016 1638   LDLCALC 150 (H) 07/23/2019 1600    CBC    Component Value Date/Time   WBC 6.5 11/09/2018 1158   WBC 7.0 10/29/2017 1151   RBC 4.59 11/09/2018 1158   RBC 4.53 10/29/2017 1151   HGB 14.2 11/09/2018 1158   HCT 42.8 11/09/2018 1158   PLT 321 11/09/2018 1158   MCV 93 11/09/2018 1158   MCH 30.9 11/09/2018 1158   MCH 30.0 10/29/2017 1151   MCHC 33.2 11/09/2018 1158   MCHC 33.0 10/29/2017 1151   RDW 12.5 11/09/2018 1158   LYMPHSABS 2.0 11/09/2018 1158   MONOABS 0.4 07/26/2017 2300   EOSABS 0.1 11/09/2018 1158   BASOSABS 0.1 11/09/2018 1158    Lab Results  Component Value Date   HGBA1C 11.1 (A) 12/10/2019    Assessment & Plan:  1. Type 2 diabetes mellitus with hyperglycemia, with long-term current use of insulin (HCC) Hyperglycemia with blood sugar of 503 in the clinic due to noncompliance We will refer to the ED start for IV fluids and administration of insulin Compliance has been emphasized and she  has been reminded that she has prescriptions to be picked up at the pharmacy - POCT glucose (manual entry) - POCT glycosylated hemoglobin (Hb A1C)  2. Non compliance w medication regimen Counseled against noncompliance; complications of diabetes due to nonadherence to prescribed regimen discussed    Health Care Maintenance: due for a PAP smear; will discuss at next visit. No orders of the defined types were placed in this encounter.   Follow-up: Return in about 1 week (around 12/17/2019) for Diabetes mellitus.       Charlott Rakes, MD, FAAFP. [facility] [place],  [place]   12/10/2019, 4:36 PM

## 2019-12-10 NOTE — ED Notes (Signed)
Pt called for roll call with no response x3.

## 2019-12-10 NOTE — ED Triage Notes (Signed)
Pt sent to ED by PCP for high blood sugar, up to 500 on doctors office, pt denies any nausea or vomiting.

## 2019-12-11 ENCOUNTER — Encounter (HOSPITAL_COMMUNITY): Payer: Self-pay | Admitting: Emergency Medicine

## 2019-12-11 DIAGNOSIS — E1165 Type 2 diabetes mellitus with hyperglycemia: Secondary | ICD-10-CM | POA: Diagnosis not present

## 2019-12-11 DIAGNOSIS — N3000 Acute cystitis without hematuria: Secondary | ICD-10-CM | POA: Diagnosis not present

## 2019-12-11 DIAGNOSIS — B373 Candidiasis of vulva and vagina: Secondary | ICD-10-CM | POA: Diagnosis not present

## 2019-12-11 LAB — URINE CULTURE

## 2019-12-11 LAB — URINALYSIS, ROUTINE W REFLEX MICROSCOPIC
Bilirubin Urine: NEGATIVE
Glucose, UA: >=500 mg/dL — AB
Ketones, ur: 5 mg/dL — AB
Nitrite: POSITIVE — AB
Protein, ur: NEGATIVE mg/dL
Specific Gravity, Urine: 1.039 — ABNORMAL HIGH (ref 1.005–1.030)
pH: 5 (ref 5.0–8.0)

## 2019-12-11 LAB — CBG MONITORING, ED
Glucose-Capillary: 478 mg/dL — ABNORMAL HIGH (ref 70–99)
Glucose-Capillary: 282 mg/dL — ABNORMAL HIGH (ref 70–99)

## 2019-12-11 MED ORDER — FLUCONAZOLE 200 MG PO TABS
200.0000 mg | ORAL_TABLET | Freq: Once | ORAL | Status: AC
  Administered 2019-12-11: 200 mg via ORAL
  Filled 2019-12-11 (×2): qty 1

## 2019-12-11 MED ORDER — INSULIN ASPART 100 UNIT/ML ~~LOC~~ SOLN: 10.0000 [IU] | Freq: Once | SUBCUTANEOUS | Status: DC

## 2019-12-11 MED ORDER — FOSFOMYCIN TROMETHAMINE 3 G PO PACK
3.0000 g | PACK | Freq: Once | ORAL | Status: AC
  Administered 2019-12-11: 3 g via ORAL
  Filled 2019-12-11: qty 3

## 2019-12-11 MED ORDER — INSULIN ASPART 100 UNIT/ML ~~LOC~~ SOLN
10.0000 [IU] | Freq: Once | SUBCUTANEOUS | Status: AC
  Administered 2019-12-11: 10 [IU] via INTRAVENOUS

## 2019-12-11 MED ORDER — SODIUM CHLORIDE 0.9 % IV BOLUS
1000.0000 mL | Freq: Once | INTRAVENOUS | Status: AC
  Administered 2019-12-11: 1000 mL via INTRAVENOUS

## 2019-12-11 NOTE — Discharge Instructions (Addendum)
Thank you for allowing me to care for you today in the Emergency Department.   Please check your phone as I have placed a consult with the social worker to follow-up with you regarding housing resources in the shelter.  You were given a dose of Diflucan to treat your yeast infection.  Your urine was concerning for a urinary tract infection.  You were given a dose of fosfomycin.  Your urine has been sent for culture.  Please follow-up with the pharmacy to pick up your supplies so that you can take your insulin.  If you are unable to make it to the pharmacy, try following up with your primary care clinician to see if they can send it to a pharmacy that is closer to you.  Return to the emergency department if you develop confusion, difficulty breathing, if you pass out, develop chest pain, uncontrollable vomiting, high fevers, thoughts of wanting to harm yourself or others, or other new, concerning symptoms.

## 2019-12-11 NOTE — ED Provider Notes (Signed)
[facility] EMERGENCY DEPARTMENT Provider Note   CSN: [id number] Arrival date & time: 12/10/19  1731     History Chief Complaint  Patient presents with  . Hyperglycemia    Sarah Charles is a 59 y.o. female with a history of diabetes mellitus type 2, CAD, tobacco use disorder, diabetic polyneuropathy, HLD, HTN, GERD who presents to the emergency room with a chief complaint of hyperglycemia.  The patient was seen by primary care earlier today for a follow-up visit.  At that visit, her blood sugar was found to be 503 and was advised to come to the ER for further work-up and evaluation.  The patient reports that she has been out of her home insulin for the last 3 days.  Reports that she is newly homeless for the last 3 weeks after her husband passed away suddenly from a massive MI.  Previously, she was living in his apartment in [place], but after his death she was evicted and has been living out of her car.  She is out of the insulin pens and has a refill at the pharmacy, but has not had enough gas to drive to the pharmacy.  She is endorsing vaginal redness with thick, white discharge and itching as well as dysuria.  She has been having polyuria and polydipsia.  She denies urinary frequency or hesitancy, blurred vision fever, chills, abdominal pain, headache, confusion, shortness of breath, chest pain, vomiting.  She reports that she has been much more depressed since the death of her husband and feels as if she is stopped taking care of herself.  She denies SI, HI, or auditory visual hallucinations.  The history is provided by the patient. No language interpreter was used.       Past Medical History:  Diagnosis Date  . Arthritis   . Coronary artery calcification seen on CT scan    a. 07/2017 noted on CT abd.  . Depression   . GERD (gastroesophageal reflux disease)   . Hyperlipidemia   . Hypertension   . Obesity   . Sleep apnea    a. Does not use CPAP "cause i  dont think I need it anymore".  . Tobacco abuse   . Type II diabetes mellitus [facility])     Patient Active Problem List   Diagnosis Date Noted  . CAD (coronary artery disease) 11/22/2017  . Chest pain, precordial 11/04/2017  . Acute chest pain   . Type 2 diabetes mellitus with hyperlipidemia ([place]) 10/29/2017  . Unstable angina ([place]) 10/29/2017  . Diabetic polyneuropathy associated with type 2 diabetes mellitus ([place]) 05/30/2017  . Mixed dyslipidemia 05/30/2017  . Depression 05/31/2016  . Hypertension 05/31/2016  . Uncontrolled type 2 diabetes mellitus with complication ([place]) 67/61/9509  . Uncontrolled type 2 diabetes mellitus with complication, with long-term current use of insulin ([place]) 04/20/2015  . Cellulitis of breast 12/29/2013  . Diabetes mellitus ([place]) 12/29/2013  . Cellulitis of female breast 12/29/2013  . Symptomatic menopausal or female climacteric states 04/08/2008  . OTHER SPECIFIED DISEASE OF NAIL 04/08/2008  . URINARY URGENCY 04/08/2008  . COUGH 04/02/2008  . HYPERTENSION, BENIGN ESSENTIAL 04/12/2007  . FATIGUE 04/12/2007  . OBESITY, NOS 07/21/2006  . Major depressive disorder, recurrent episode ([place]) 07/21/2006  . TOBACCO DEPENDENCE 07/21/2006  . GASTROESOPHAGEAL REFLUX, NO ESOPHAGITIS 07/21/2006  . INCONTINENCE, STRESS, FEMALE 07/21/2006    Past Surgical History:  Procedure Laterality Date  . CHOLECYSTECTOMY    . CORONARY STENT INTERVENTION N/A 11/04/2017   Procedure: CORONARY  STENT INTERVENTION;  Surgeon: Martinique, Peter M, MD;  Location: [facility] CV LAB;  Service: Cardiovascular;  Laterality: N/A;  . INCISION AND DRAINAGE ABSCESS Left 12/22/2012   Procedure: INCISION AND DRAINAGE ABSCESS;  Surgeon: Jamesetta So, MD;  Location: [facility];  Service: General;  Laterality: Left;  . INTRAVASCULAR PRESSURE WIRE/FFR STUDY N/A 11/04/2017   Procedure: INTRAVASCULAR PRESSURE WIRE/FFR STUDY;  Surgeon: Martinique, Peter M, MD;  Location: [facility];  Service:  Cardiovascular;  Laterality: N/A;  . LEFT HEART CATH AND CORONARY ANGIOGRAPHY N/A 11/04/2017   Procedure: LEFT HEART CATH AND CORONARY ANGIOGRAPHY;  Surgeon: Martinique, Peter M, MD;  Location: [facility];  Service: Cardiovascular;  Laterality: N/A;  . TONSILLECTOMY    . TUBAL LIGATION       OB History   No obstetric history on file.     Family History  Problem Relation Age of Onset  . Hypertension Mother   . CVA Mother   . COPD Mother   . Heart disease Mother   . Kidney Stones Mother   . Heart attack Mother        MI in late 65's  . Breast cancer Mother   . CAD Father   . Hypercholesterolemia Father   . Heart attack Father        Died @ 26 of MI  . Cancer Maternal Uncle   . Diabetes Maternal Grandmother   . Cancer Maternal Grandfather        lung cancer  . Diabetes Sister     Social History   Tobacco Use  . Smoking status: Former Smoker    Packs/day: 1.00    Years: 41.00    Pack years: 41.00    Types: Cigarettes    Quit date: 10/07/2018    Years since quitting: 1.1  . Smokeless tobacco: Never Used  Substance Use Topics  . Alcohol use: No  . Drug use: No    Home Medications Prior to Admission medications   Medication Sig Start Date End Date Taking? Authorizing Provider  amLODipine (NORVASC) 2.5 MG tablet Take 1 tablet (2.5 mg total) by mouth daily. 11/09/18 08/20/19  [name], PA-C  aspirin EC 81 MG tablet Take 81 mg by mouth daily.    [provider]  Blood Glucose Monitoring Suppl (ACCU-CHEK GUIDE ME) w/Device KIT 1 each by Does not apply route 3 (three) times daily. 11/09/18   [name], PA-C  buPROPion (WELLBUTRIN SR) 150 MG 12 hr tablet TAKE 1 TABLET TWICE DAILY 08/29/19   Charlott Rakes, MD  cetirizine (ZYRTEC) 10 MG tablet TAKE 1 TABLET BY MOUTH EVERY DAY 08/02/19   Charlott Rakes, MD  clopidogrel (PLAVIX) 75 MG tablet Take 1 tablet (75 mg total) by mouth daily. 11/09/18   [name], PA-C  dicyclomine (BENTYL) 10 MG  capsule Take 1 capsule by mouth daily. 05/26/18   [provider]  fenofibrate (TRICOR) 145 MG tablet Take 1 tablet (145 mg total) by mouth daily. 12/27/18   Dorothy Spark, MD  FLUoxetine (PROZAC) 20 MG capsule Take 1 capsule (20 mg total) by mouth daily. 07/27/19   Charlott Rakes, MD  gabapentin (NEURONTIN) 300 MG capsule Take 1 capsule (300 mg total) by mouth 2 (two) times daily. 07/23/19   Charlott Rakes, MD  glucose blood (ACCU-CHEK GUIDE) test strip Use as instructed 10/17/19   [name], PA-C  hydrOXYzine (ATARAX/VISTARIL) 25 MG tablet Take 1 tablet (25 mg total) by mouth  3 (three) times daily as needed. 05/24/19   Charlott Rakes, MD  Insulin Pen Needle (PEN NEEDLES) 31G X 5 MM MISC 1 each by Does not apply route daily. 11/30/18   Charlott Rakes, MD  Lancets Misc. (ACCU-CHEK FASTCLIX LANCET) KIT As directed 11/09/18   [name], PA-C  LANTUS SOLOSTAR 100 UNIT/ML Solostar Pen INJECT 45 UNITS INTO THE SKIN AT BEDTIME. 11/27/19   Charlott Rakes, MD  lisinopril (ZESTRIL) 20 MG tablet Take 1 tablet (20 mg total) by mouth daily. 11/09/18   [name], PA-C  methocarbamol (ROBAXIN) 500 MG tablet TAKE 1 TABLET BY MOUTH 3 TIMES DAILY. 05/27/18   Charlott Rakes, MD  metoCLOPramide (REGLAN) 10 MG tablet TAKE 1 TABLET BY MOUTH EVERY EIGHT HOURS AS NEEDED FOR NAUSEA OR VOMITING. 11/09/18   [name], PA-C  metoprolol tartrate (LOPRESSOR) 25 MG tablet Take 1 tablet (25 mg total) by mouth 2 (two) times daily for 30 days. 11/09/18 12/09/18  [name], PA-C  Multiple Vitamin (MULTIVITAMIN) tablet Take 1 tablet by mouth daily.    [provider]  nitroGLYCERIN (NITROSTAT) 0.4 MG SL tablet Place 0.4 mg under the tongue every 5 (five) minutes as needed for chest pain.    [provider]  pantoprazole (PROTONIX) 40 MG tablet Take 1 tablet (40 mg total) by mouth daily. 11/09/18   [name], PA-C  rosuvastatin (CRESTOR) 40 MG tablet Take 1 tablet (40 mg  total) by mouth daily. 07/23/19   Charlott Rakes, MD  sitaGLIPtin-metformin (JANUMET) 50-1000 MG tablet Take 1 tablet by mouth 2 (two) times daily with a meal. 07/23/19   Charlott Rakes, MD  sulfamethoxazole-trimethoprim (BACTRIM DS) 800-160 MG tablet Take 1 tablet by mouth 2 (two) times daily. 08/20/19   Mayers, Cari S, PA-C  traZODone (DESYREL) 50 MG tablet Take 1 tablet (50 mg total) by mouth at bedtime as needed for sleep. 11/09/18   [name], PA-C  vitamin B-12 (CYANOCOBALAMIN) 500 MCG tablet Take 500 mcg by mouth daily.    [provider]  vitamin C (ASCORBIC ACID) 500 MG tablet Take 500 mg by mouth daily.    [provider]    Allergies    Cymbalta [duloxetine hcl]  Review of Systems   Review of Systems  Constitutional: Negative for activity change, chills and fever.  Eyes: Negative for visual disturbance.  Respiratory: Negative for choking and shortness of breath.   Cardiovascular: Negative for chest pain.  Gastrointestinal: Negative for abdominal pain, blood in stool, diarrhea, nausea and vomiting.  Endocrine: Positive for polydipsia and polyuria.  Genitourinary: Positive for dysuria, vaginal discharge and vaginal pain. Negative for frequency, hematuria, pelvic pain and vaginal bleeding.  Musculoskeletal: Negative for back pain, myalgias, neck pain and neck stiffness.  Skin: Negative for rash.  Allergic/Immunologic: Negative for immunocompromised state.  Neurological: Negative for seizures, syncope, weakness, numbness and headaches.  Psychiatric/Behavioral: Negative for confusion.    Physical Exam Updated Vital Signs BP 140/80 (BP Location: Right Arm)   Pulse 90   Temp 98.4 F (36.9 C) (Oral)   Resp 18   Ht _0  (1.6 m)   Wt 77.6 kg   SpO2 99%   BMI 30.29 kg/m   Physical Exam Vitals and nursing note reviewed.  Constitutional:      General: She is not in acute distress.    Appearance: She is not ill-appearing, toxic-appearing or diaphoretic.   HENT:     Head: Normocephalic.     Mouth/Throat:  Mouth: Mucous membranes are moist.     Pharynx: No oropharyngeal exudate or posterior oropharyngeal erythema.  Eyes:     Conjunctiva/sclera: Conjunctivae normal.  Cardiovascular:     Rate and Rhythm: Normal rate and regular rhythm.     Pulses: Normal pulses.     Heart sounds: Normal heart sounds. No murmur heard.  No friction rub. No gallop.   Pulmonary:     Effort: Pulmonary effort is normal. No respiratory distress.     Breath sounds: No stridor. No wheezing, rhonchi or rales.  Chest:     Chest wall: No tenderness.  Abdominal:     General: There is no distension.     Palpations: Abdomen is soft. There is no mass.     Tenderness: There is no abdominal tenderness. There is no right CVA tenderness, left CVA tenderness, guarding or rebound.     Hernia: No hernia is present.  Genitourinary:    Comments: Chaperoned exam.  Vulvovaginal candidal rash noted in the bilateral groin. Musculoskeletal:     Cervical back: Neck supple.     Right lower leg: No edema.     Left lower leg: No edema.  Skin:    General: Skin is warm.     Findings: No rash.  Neurological:     Mental Status: She is alert.  Psychiatric:        Behavior: Behavior normal.     ED Results / Procedures / Treatments   Labs (all labs ordered are listed, but only abnormal results are displayed) Labs Reviewed  CBC WITH DIFFERENTIAL/PLATELET - Abnormal; Notable for the following components:      Result Value   RBC 5.34 (*)    Hemoglobin 16.0 (*)    HCT 47.5 (*)    All other components within normal limits  COMPREHENSIVE METABOLIC PANEL - Abnormal; Notable for the following components:   Sodium 132 (*)    Chloride 95 (*)    Glucose, Bld 537 (*)    AST 12 (*)    All other components within normal limits  URINALYSIS, ROUTINE W REFLEX MICROSCOPIC - Abnormal; Notable for the following components:   Specific Gravity, Urine 1.039 (*)    Glucose, UA >=500 (*)     Hgb urine dipstick SMALL (*)    Ketones, ur 5 (*)    Nitrite POSITIVE (*)    Leukocytes,Ua TRACE (*)    Bacteria, UA RARE (*)    All other components within normal limits  CBG MONITORING, ED - Abnormal; Notable for the following components:   Glucose-Capillary 517 (*)    All other components within normal limits  CBG MONITORING, ED - Abnormal; Notable for the following components:   Glucose-Capillary 478 (*)    All other components within normal limits  CBG MONITORING, ED - Abnormal; Notable for the following components:   Glucose-Capillary 282 (*)    All other components within normal limits  URINE CULTURE    EKG None  Radiology No results found.  Procedures Procedures (including critical care time)  Medications Ordered in ED Medications  sodium chloride 0.9 % bolus 1,000 mL (0 mLs Intravenous Stopped 12/11/19 0406)  insulin aspart (novoLOG) injection 10 Units (10 Units Intravenous Given 12/11/19 0225)  fluconazole (DIFLUCAN) tablet 200 mg (200 mg Oral Given 12/11/19 0443)  fosfomycin (MONUROL) packet 3 g (3 g Oral Given 12/11/19 0443)    ED Course  I have reviewed the triage vital signs and the nursing notes.  Pertinent labs & imaging results  that were available during my care of the patient were reviewed by me and considered in my medical decision making (see chart for details).    MDM Rules/Calculators/A&P                          59 year old female with a history of diabetes mellitus type 2, CAD, tobacco use disorder, diabetic polyneuropathy, HLD, HTN, GERD who presents to the emergency department from her primary care provider's office with a chief complaint of hyperglycemia.  Patient presented for a follow-up appointment with primary care earlier today and her blood sugar was found to be 503.  She was advised to come to the ER for further work-up and evaluation.  Glucose is 537 with normal anion gap and bicarb.  Doubt DKA or HHS.  She does have pseudohyponatremia  secondary to hyper glycemia.  UA is concerning for infection.  Urine culture sent.  Will treat with 1 dose of fosfomycin while culture is pending.  Patient also does have vulvovaginal candidal infection, likely secondary to hyperglycemia, and she was given Diflucan in the ER.  Doubt cellulitis or Fournier's gangrene.  She was given 10 units of NovoLog and a liter of fluids since her CBC appeared hemoconcentrated.  On CBG recheck, her glucose was 282.  Patient reports that she is out of her insulin pens, but has a prescription at the pharmacy.  She has been unable to pick it up due to not having enough gas in her vehicle.  She is also newly homeless for the last 3 weeks after the sudden death of her husband.  Will place transition of care consult for after the patient is discharged to see if they can follow-up with any shelters or resources in the area.  All questions answered and patient is agreeable with this plan.  Strict ER return precautions given.  She is hemodynamically stable and in no acute distress.  Safe for discharge to home with outpatient follow-up as indicated.  Final Clinical Impression(s) / ED Diagnoses Final diagnoses:  Hyperglycemia  Vaginal candidiasis  Acute cystitis without hematuria    Rx / DC Orders ED Discharge Orders    None       Joanne Gavel, PA-C 12/11/19 [place], April, MD 12/11/19 2307

## 2019-12-11 NOTE — ED Notes (Signed)
After patient was brought back to the room and was being placed on monitor, patient became tearful talking about her late husband. Patient expressed that she "has stopped taking care of herself" because she "just doesn't care" and feeling as if she "has nothing to live for". Patient comforted and RN notified of conversation.

## 2019-12-12 ENCOUNTER — Telehealth: Payer: Self-pay | Admitting: *Deleted

## 2019-12-12 NOTE — Telephone Encounter (Signed)
Attempted to contact pt to complete transition of care assessment; message states "voicemail has not been set up"; unable to leave message.  Burnard Bunting, RN, BSN, CCRN [facility]agement [facility] [phone]

## 2019-12-12 NOTE — Telephone Encounter (Signed)
  Transition Care Management Follow-up Telephone Call  . Medicaid Managed Care Transition Call Status:MM [facility] Call Made  . Date of discharge and from where: [facility], 12/11/19  . How have you been since you were released from the hospital? ok  . Any questions or concerns? Having to wait so long for room  Items Reviewed: Marland Kitchen Did the pt receive and understand the discharge instructions provided? Yes  . Medications obtained and verified? Yes  . Any new allergies since your discharge? No  . Dietary orders reviewed? No . Do you have support at home? Yes  Functional Questionnaire: (I = Independent and D = Dependent)  ADLs: Independent Bathing/Dressing:Independent Meal Prep: Independent Eating: Independent Maintaining continence: Independent Transferring/Ambulation: Independent Managing Meds: Independent Follow up appointments reviewed:  PCP Hospital f/u appt confirmed? Yes  Scheduled to see Georgian Co on 12/19/19 @ 1410. Specialist Hospital f/u appt confirmed ? Yes Scheduled to see Nicholes Rough on 02/14/14 @ 1415  Are transportation arrangements needed? No   If their condition worsens, is the pt aware to call PCP or go to the EmergencyDept.? Yes  Was the patient provided with contact information for the PCP's office or ED? yes  Was to pt encouraged to call back with questions or concerns? Yes  Burnard Bunting, RN, BSN, CCRN [facility]agement [facility] [phone]

## 2019-12-13 ENCOUNTER — Telehealth: Payer: Self-pay | Admitting: *Deleted

## 2019-12-13 NOTE — Telephone Encounter (Signed)
Call received from [name] in response to [facility] general discharge transition call. Patient does have a follow up on 12/20/19,                                 Courtenay Hirth                       Patient Engagement Center                            [phone]

## 2019-12-19 ENCOUNTER — Ambulatory Visit: Payer: Medicaid Other | Admitting: Physician Assistant

## 2019-12-20 ENCOUNTER — Ambulatory Visit: Payer: Medicaid Other | Admitting: Physician Assistant

## 2019-12-28 ENCOUNTER — Other Ambulatory Visit: Payer: Self-pay | Admitting: Physician Assistant

## 2019-12-28 DIAGNOSIS — K219 Gastro-esophageal reflux disease without esophagitis: Secondary | ICD-10-CM

## 2019-12-28 NOTE — Telephone Encounter (Signed)
Requested medication (s) are due for refill today: Yes  Requested medication (s) are on the active medication list: Yes  Last refill:  11/09/18  Future visit scheduled: Yes  Notes to clinic:  Prescription has expired.    Requested Prescriptions  Pending Prescriptions Disp Refills   pantoprazole (PROTONIX) 40 MG tablet [Pharmacy Med Name: PANTOPRAZOLE SOD DR 40 MG TAB] 90 tablet 2    Sig: TAKE 1 TABLET EVERY DAY      Gastroenterology: Proton Pump Inhibitors Passed - 12/28/2019 11:30 AM      Passed - Valid encounter within last 12 months    Recent Outpatient Visits           2 weeks ago Type 2 diabetes mellitus with hyperglycemia, with long-term current use of insulin (HCC)   [facility] Scott, [place], MD   2 months ago Thickened nails   [facility] [place], [place], [place]   4 months ago Urinary tract infection without hematuria, site unspecified   [place] [address], Cari S, PA-C   5 months ago Type 2 diabetes mellitus with hyperglycemia, with long-term current use of insulin (HCC)   [facility] Patterson, [place], [place]   8 months ago Uncontrolled type 2 diabetes mellitus with complication, with long-term current use of insulin (HCC)   [facility] [name], MD       Future Appointments             In 3 days [name], MD [facility]

## 2019-12-31 ENCOUNTER — Ambulatory Visit: Payer: Medicaid Other | Attending: Family Medicine | Admitting: Family Medicine

## 2019-12-31 ENCOUNTER — Telehealth: Payer: Self-pay

## 2019-12-31 ENCOUNTER — Other Ambulatory Visit: Payer: Self-pay

## 2019-12-31 ENCOUNTER — Encounter: Payer: Self-pay | Admitting: Family Medicine

## 2019-12-31 VITALS — BP 170/82 | HR 86 | Ht 63.0 in | Wt 162.6 lb

## 2019-12-31 DIAGNOSIS — E1165 Type 2 diabetes mellitus with hyperglycemia: Secondary | ICD-10-CM

## 2019-12-31 DIAGNOSIS — I10 Essential (primary) hypertension: Secondary | ICD-10-CM | POA: Diagnosis not present

## 2019-12-31 DIAGNOSIS — I739 Peripheral vascular disease, unspecified: Secondary | ICD-10-CM | POA: Diagnosis not present

## 2019-12-31 DIAGNOSIS — IMO0002 Reserved for concepts with insufficient information to code with codable children: Secondary | ICD-10-CM

## 2019-12-31 DIAGNOSIS — Z794 Long term (current) use of insulin: Secondary | ICD-10-CM | POA: Diagnosis not present

## 2019-12-31 DIAGNOSIS — I2510 Atherosclerotic heart disease of native coronary artery without angina pectoris: Secondary | ICD-10-CM | POA: Diagnosis not present

## 2019-12-31 DIAGNOSIS — E1169 Type 2 diabetes mellitus with other specified complication: Secondary | ICD-10-CM

## 2019-12-31 DIAGNOSIS — E785 Hyperlipidemia, unspecified: Secondary | ICD-10-CM | POA: Diagnosis not present

## 2019-12-31 DIAGNOSIS — E118 Type 2 diabetes mellitus with unspecified complications: Secondary | ICD-10-CM

## 2019-12-31 LAB — GLUCOSE, POCT (MANUAL RESULT ENTRY)
POC Glucose: 407 mg/dl — AB (ref 70–99)
POC Glucose: 504 mg/dl — AB (ref 70–99)

## 2019-12-31 LAB — POCT URINALYSIS DIP (CLINITEK)
Bilirubin, UA: NEGATIVE
Glucose, UA: >=1000 mg/dL — AB
Ketones, POC UA: NEGATIVE mg/dL
Leukocytes, UA: NEGATIVE
Nitrite, UA: NEGATIVE
POC PROTEIN,UA: NEGATIVE
Spec Grav, UA: 1.015 (ref 1.010–1.025)
Urobilinogen, UA: 0.2 E.U./dL
pH, UA: 5.5 (ref 5.0–8.0)

## 2019-12-31 MED ORDER — ROSUVASTATIN CALCIUM 40 MG PO TABS: 40.0000 mg | ORAL_TABLET | Freq: Every day | ORAL | 1 refills | Status: DC

## 2019-12-31 MED ORDER — VICTOZA 18 MG/3ML ~~LOC~~ SOPN: PEN_INJECTOR | SUBCUTANEOUS | 3 refills | Status: DC

## 2019-12-31 MED ORDER — INSULIN ASPART 100 UNIT/ML ~~LOC~~ SOLN
20.0000 [IU] | Freq: Once | SUBCUTANEOUS | Status: AC
  Administered 2019-12-31: 20 [IU] via SUBCUTANEOUS

## 2019-12-31 MED ORDER — JANUMET 50-1000 MG PO TABS: 1.0000 | ORAL_TABLET | Freq: Two times a day (BID) | ORAL | 1 refills | Status: DC

## 2019-12-31 MED ORDER — METOPROLOL TARTRATE 25 MG PO TABS: 25.0000 mg | ORAL_TABLET | Freq: Two times a day (BID) | ORAL | 1 refills | Status: DC

## 2019-12-31 MED ORDER — LISINOPRIL 20 MG PO TABS: 20.0000 mg | ORAL_TABLET | Freq: Every day | ORAL | 1 refills | Status: DC

## 2019-12-31 MED ORDER — LANTUS SOLOSTAR 100 UNIT/ML ~~LOC~~ SOPN: 55.0000 [IU] | PEN_INJECTOR | Freq: Every day | SUBCUTANEOUS | 3 refills | Status: DC

## 2019-12-31 MED ORDER — AMLODIPINE BESYLATE 5 MG PO TABS: 5.0000 mg | ORAL_TABLET | Freq: Every day | ORAL | 1 refills | Status: DC

## 2019-12-31 NOTE — Progress Notes (Signed)
Subjective:  Patient ID: Sarah Charles, female    DOB: 03-16-1961  Age: 59 y.o. MRN: [id number]  CC: Diabetes   HPI Iver Fehrenbach is a 59 year old female with a history of type 2 diabetes mellitus (A1c 11.2), noncompliance, hypertension, hyperlipidemia, GERD, CAD(s/p DES)who presents today fora follow-up visit. Her blood sugar is 504 in the clinic today and she had her last meal 1-1/2 hours ago, she has not had any insulin today but states she took 50 units of Lantus last night.  Denies presence of blurry vision, abdominal pain, headache, fatigue but she has polydipsia.  At her last visit 3 weeks ago we had referred her to the emergency room due to blood sugar of greater than 500 and her not taking her medications. He sugars at home have been around 210-215 before breakfast Her BP is elevated and she endorses compliance with her antihypertensive. Denies presence of chest pain, dyspnea; she has no claudication pain at this time and her coronary artery disease is followed by cardiology. She moved back with her Mom as she has to care for her Mom who is on Oxygen and so her living situation has improved at this time.  Past Medical History:  Diagnosis Date  . Arthritis   . Coronary artery calcification seen on CT scan    a. 07/2017 noted on CT abd.  . Depression   . GERD (gastroesophageal reflux disease)   . Hyperlipidemia   . Hypertension   . Obesity   . Sleep apnea    a. Does not use CPAP "cause i dont think I need it anymore".  . Tobacco abuse   . Type II diabetes mellitus ([place])     Past Surgical History:  Procedure Laterality Date  . CHOLECYSTECTOMY    . CORONARY STENT INTERVENTION N/A 11/04/2017   Procedure: CORONARY STENT INTERVENTION;  Surgeon: Martinique, Peter M, MD;  Location: [facility];  Service: Cardiovascular;  Laterality: N/A;  . INCISION AND DRAINAGE ABSCESS Left 12/22/2012   Procedure: INCISION AND DRAINAGE ABSCESS;  Surgeon: Jamesetta So, MD;  Location: [facility];   Service: General;  Laterality: Left;  . INTRAVASCULAR PRESSURE WIRE/FFR STUDY N/A 11/04/2017   Procedure: INTRAVASCULAR PRESSURE WIRE/FFR STUDY;  Surgeon: Martinique, Peter M, MD;  Location: [facility];  Service: Cardiovascular;  Laterality: N/A;  . LEFT HEART CATH AND CORONARY ANGIOGRAPHY N/A 11/04/2017   Procedure: LEFT HEART CATH AND CORONARY ANGIOGRAPHY;  Surgeon: Martinique, Peter M, MD;  Location: [facility];  Service: Cardiovascular;  Laterality: N/A;  . TONSILLECTOMY    . TUBAL LIGATION      Family History  Problem Relation Age of Onset  . Hypertension Mother   . CVA Mother   . COPD Mother   . Heart disease Mother   . Kidney Stones Mother   . Heart attack Mother        MI in late 65's  . Breast cancer Mother   . CAD Father   . Hypercholesterolemia Father   . Heart attack Father        Died @ 38 of MI  . Cancer Maternal Uncle   . Diabetes Maternal Grandmother   . Cancer Maternal Grandfather        lung cancer  . Diabetes Sister     Allergies  Allergen Reactions  . Cymbalta [Duloxetine Hcl] Nausea Only    Nausea, lack of therapeutic effect    Outpatient Medications Prior to Visit  Medication Sig Dispense Refill  .  aspirin EC 81 MG tablet Take 81 mg by mouth daily.    . Blood Glucose Monitoring Suppl (ACCU-CHEK GUIDE ME) w/Device KIT 1 each by Does not apply route 3 (three) times daily. 1 kit 0  . buPROPion (WELLBUTRIN SR) 150 MG 12 hr tablet TAKE 1 TABLET TWICE DAILY 60 tablet 2  . cetirizine (ZYRTEC) 10 MG tablet TAKE 1 TABLET BY MOUTH EVERY DAY 30 tablet 2  . clopidogrel (PLAVIX) 75 MG tablet Take 1 tablet (75 mg total) by mouth daily. 90 tablet 2  . dicyclomine (BENTYL) 10 MG capsule Take 1 capsule by mouth daily.    . fenofibrate (TRICOR) 145 MG tablet Take 1 tablet (145 mg total) by mouth daily. 90 tablet 3  . FLUoxetine (PROZAC) 20 MG capsule Take 1 capsule (20 mg total) by mouth daily. 30 capsule 6  . gabapentin (NEURONTIN) 300 MG capsule Take 1  capsule (300 mg total) by mouth 2 (two) times daily. 60 capsule 6  . glucose blood (ACCU-CHEK GUIDE) test strip Use as instructed 100 each 12  . hydrOXYzine (ATARAX/VISTARIL) 25 MG tablet Take 1 tablet (25 mg total) by mouth 3 (three) times daily as needed. 60 tablet 3  . Insulin Pen Needle (PEN NEEDLES) 31G X 5 MM MISC 1 each by Does not apply route daily. 100 each 11  . Lancets Misc. (ACCU-CHEK FASTCLIX LANCET) KIT As directed 1 kit 0  . methocarbamol (ROBAXIN) 500 MG tablet TAKE 1 TABLET BY MOUTH 3 TIMES DAILY. 90 tablet 0  . metoCLOPramide (REGLAN) 10 MG tablet TAKE 1 TABLET BY MOUTH EVERY EIGHT HOURS AS NEEDED FOR NAUSEA OR VOMITING. 40 tablet 0  . Multiple Vitamin (MULTIVITAMIN) tablet Take 1 tablet by mouth daily.    . nitroGLYCERIN (NITROSTAT) 0.4 MG SL tablet Place 0.4 mg under the tongue every 5 (five) minutes as needed for chest pain.    . pantoprazole (PROTONIX) 40 MG tablet TAKE 1 TABLET EVERY DAY 90 tablet 2  . sulfamethoxazole-trimethoprim (BACTRIM DS) 800-160 MG tablet Take 1 tablet by mouth 2 (two) times daily. 10 tablet 0  . traZODone (DESYREL) 50 MG tablet Take 1 tablet (50 mg total) by mouth at bedtime as needed for sleep. 30 tablet 2  . vitamin B-12 (CYANOCOBALAMIN) 500 MCG tablet Take 500 mcg by mouth daily.    . vitamin C (ASCORBIC ACID) 500 MG tablet Take 500 mg by mouth daily.    Marland Kitchen LANTUS SOLOSTAR 100 UNIT/ML Solostar Pen INJECT 45 UNITS INTO THE SKIN AT BEDTIME. 15 mL 0  . lisinopril (ZESTRIL) 20 MG tablet Take 1 tablet (20 mg total) by mouth daily. 30 tablet 6  . rosuvastatin (CRESTOR) 40 MG tablet Take 1 tablet (40 mg total) by mouth daily. 90 tablet 2  . sitaGLIPtin-metformin (JANUMET) 50-1000 MG tablet Take 1 tablet by mouth 2 (two) times daily with a meal. 180 tablet 1  . amLODipine (NORVASC) 2.5 MG tablet Take 1 tablet (2.5 mg total) by mouth daily. 180 tablet 3  . metoprolol tartrate (LOPRESSOR) 25 MG tablet Take 1 tablet (25 mg total) by mouth 2 (two) times daily  for 30 days. 60 tablet 12   No facility-administered medications prior to visit.     ROS Review of Systems  Constitutional: Negative for activity change, appetite change and fatigue.  HENT: Negative for congestion, sinus pressure and sore throat.   Eyes: Negative for visual disturbance.  Respiratory: Negative for cough, chest tightness, shortness of breath and wheezing.   Cardiovascular: Negative for  chest pain and palpitations.  Gastrointestinal: Negative for abdominal distention, abdominal pain and constipation.  Endocrine: Negative for polydipsia.  Genitourinary: Negative for dysuria and frequency.  Musculoskeletal: Negative for arthralgias and back pain.  Skin: Negative for rash.  Neurological: Negative for tremors, light-headedness and numbness.  Hematological: Does not bruise/bleed easily.  Psychiatric/Behavioral: Negative for agitation and behavioral problems.    Objective:  BP (!) 170/82   Pulse 86   Ht _0  (1.6 m)   Wt 162 lb 9.6 oz (73.8 kg)   SpO2 96%   BMI 28.80 kg/m   BP/Weight 12/31/2019 12/11/2019 01/15/2352  Systolic BP 614 431 -  Diastolic BP 82 80 -  Wt. (Lbs) 162.6 - 171  BMI 28.8 - 30.29      Physical Exam Constitutional:      Appearance: She is well-developed.  Neck:     Vascular: No JVD.  Cardiovascular:     Rate and Rhythm: Normal rate.     Heart sounds: Normal heart sounds. No murmur heard.   Pulmonary:     Effort: Pulmonary effort is normal.     Breath sounds: Normal breath sounds. No wheezing or rales.  Chest:     Chest wall: No tenderness.  Abdominal:     General: Bowel sounds are normal. There is no distension.     Palpations: Abdomen is soft. There is no mass.     Tenderness: There is no abdominal tenderness.  Musculoskeletal:        General: Normal range of motion.     Right lower leg: No edema.     Left lower leg: No edema.  Neurological:     Mental Status: She is alert and oriented to person, place, and time.  Psychiatric:         Mood and Affect: Mood normal.     CMP Latest Ref Rng & Units 12/10/2019 07/23/2019 11/09/2018  Glucose 70 - 99 mg/dL 537(HH) 352(H) 552(HH)  BUN 6 - 20 mg/dL _1 Creatinine 0.44 - 1.00 mg/dL 0.78 0.69 0.68  Sodium 135 - 145 mmol/L 132(L) 138 136  Potassium 3.5 - 5.1 mmol/L 4.4 4.3 4.6  Chloride 98 - 111 mmol/L 95(L) 99 98  CO2 22 - 32 mmol/L _2 Calcium 8.9 - 10.3 mg/dL 10.1 10.0 9.6  Total Protein 6.5 - 8.1 g/dL 7.2 6.9 6.6  Total Bilirubin 0.3 - 1.2 mg/dL 0.8 0.2 0.2  Alkaline Phos 38 - 126 U/L 119 117 144(H)  AST 15 - 41 U/L 12(L) 12 17  ALT 0 - 44 U/L _3 Lipid Panel     Component Value Date/Time   CHOL 242 (H) 07/23/2019 1600   TRIG 228 (H) 07/23/2019 1600   HDL 50 07/23/2019 1600   CHOLHDL 4.8 (H) 07/23/2019 1600   CHOLHDL 9.7 (H) 07/23/2016 1638   VLDL NOT CALC 07/23/2016 1638   LDLCALC 150 (H) 07/23/2019 1600    CBC    Component Value Date/Time   WBC 8.9 12/10/2019 1745   RBC 5.34 (H) 12/10/2019 1745   HGB 16.0 (H) 12/10/2019 1745   HGB 14.2 11/09/2018 1158   HCT 47.5 (H) 12/10/2019 1745   HCT 42.8 11/09/2018 1158   PLT 382 12/10/2019 1745   PLT 321 11/09/2018 1158   MCV 89.0 12/10/2019 1745   MCV 93 11/09/2018 1158   MCH 30.0 12/10/2019 1745   MCHC 33.7 12/10/2019 1745   RDW 12.8 12/10/2019 1745   RDW  12.5 11/09/2018 1158   LYMPHSABS 2.9 12/10/2019 1745   LYMPHSABS 2.0 11/09/2018 1158   MONOABS 0.6 12/10/2019 1745   EOSABS 0.1 12/10/2019 1745   EOSABS 0.1 11/09/2018 1158   BASOSABS 0.1 12/10/2019 1745   BASOSABS 0.1 11/09/2018 1158    Lab Results  Component Value Date   HGBA1C 11.1 (A) 12/10/2019    Assessment & Plan:  1. Type 2 diabetes mellitus with hyperglycemia, with long-term current use of insulin (HCC) Uncontrolled with A1c of 11.1; goal is less than 7.0 Hyperglycemia with blood glucose of 503 and she is at high risk of progressing to DKA NovoLog 20 units administered stat and oral hydration  provided Urinalysis to evaluate for presence of ketones-negative for ketones Patient observed for 30 minutes post administration of insulin and blood sugar repeated returned at 407. Increase Lantus to 55 units at bedtime, initiated Victoza and I will see her at her next visit to titrate upwards - POCT glucose (manual entry) - insulin aspart (novoLOG) injection 20 Units - POCT glucose (manual entry) - insulin glargine (LANTUS SOLOSTAR) 100 UNIT/ML Solostar Pen; Inject 55 Units into the skin at bedtime.  Dispense: 30 mL; Refill: 3 - liraglutide (VICTOZA) 18 MG/3ML SOPN; Start 0.77m SQ once a day for 7 days, then increase to 1.268monce a day  Dispense: 6 mL; Refill: 3 - rosuvastatin (CRESTOR) 40 MG tablet; Take 1 tablet (40 mg total) by mouth daily.  Dispense: 90 tablet; Refill: 1 - sitaGLIPtin-metformin (JANUMET) 50-1000 MG tablet; Take 1 tablet by mouth 2 (two) times daily with a meal.  Dispense: 180 tablet; Refill: 1 - POCT URINALYSIS DIP (CLINITEK)  2. Essential hypertension Uncontrolled I have increased amlodipine dose from 2.5 mg to 5 mg daily Counseled on blood pressure goal of less than 130/80, low-sodium, DASH diet, medication compliance, 150 minutes of moderate intensity exercise per week. Discussed medication compliance, adverse effects. - amLODipine (NORVASC) 5 MG tablet; Take 1 tablet (5 mg total) by mouth daily.  Dispense: 180 tablet; Refill: 1 - lisinopril (ZESTRIL) 20 MG tablet; Take 1 tablet (20 mg total) by mouth daily.  Dispense: 90 tablet; Refill: 1  3. Uncontrolled type 2 diabetes mellitus with complication, with long-term current use of insulin (HCSevernSee #1 above - lisinopril (ZESTRIL) 20 MG tablet; Take 1 tablet (20 mg total) by mouth daily.  Dispense: 90 tablet; Refill: 1  4. Claudication (HCMendotaStable - rosuvastatin (CRESTOR) 40 MG tablet; Take 1 tablet (40 mg total) by mouth daily.  Dispense: 90 tablet; Refill: 1  5. Coronary artery disease involving native coronary  artery of native heart without angina pectoris Stable Risk factor modification - rosuvastatin (CRESTOR) 40 MG tablet; Take 1 tablet (40 mg total) by mouth daily.  Dispense: 90 tablet; Refill: 1 - sitaGLIPtin-metformin (JANUMET) 50-1000 MG tablet; Take 1 tablet by mouth 2 (two) times daily with a meal.  Dispense: 180 tablet; Refill: 1  6. Hyperlipidemia associated with type 2 diabetes mellitus (HCFieldsboroUncontrolled Noncompliance likely playing a role If still uncontrolled with next labs will refer to the lipid clinic for PCSK9 inhibitor - rosuvastatin (CRESTOR) 40 MG tablet; Take 1 tablet (40 mg total) by mouth daily.  Dispense: 90 tablet; Refill: 1    Meds ordered this encounter  Medications  . insulin aspart (novoLOG) injection 20 Units  . amLODipine (NORVASC) 5 MG tablet    Sig: Take 1 tablet (5 mg total) by mouth daily.    Dispense:  180 tablet    Refill:  1  Dose increase  . insulin glargine (LANTUS SOLOSTAR) 100 UNIT/ML Solostar Pen    Sig: Inject 55 Units into the skin at bedtime.    Dispense:  30 mL    Refill:  3    Dose increase  . liraglutide (VICTOZA) 18 MG/3ML SOPN    Sig: Start 0.39m SQ once a day for 7 days, then increase to 1.225monce a day    Dispense:  6 mL    Refill:  3  . lisinopril (ZESTRIL) 20 MG tablet    Sig: Take 1 tablet (20 mg total) by mouth daily.    Dispense:  90 tablet    Refill:  1  . metoprolol tartrate (LOPRESSOR) 25 MG tablet    Sig: Take 1 tablet (25 mg total) by mouth 2 (two) times daily.    Dispense:  180 tablet    Refill:  1  . rosuvastatin (CRESTOR) 40 MG tablet    Sig: Take 1 tablet (40 mg total) by mouth daily.    Dispense:  90 tablet    Refill:  1  . sitaGLIPtin-metformin (JANUMET) 50-1000 MG tablet    Sig: Take 1 tablet by mouth 2 (two) times daily with a meal.    Dispense:  180 tablet    Refill:  1    Follow-up: Return in about 1 month (around 01/31/2020) for Diabetes mellitus.       EnCharlott RakesMD, FAAFP. [facility]Od[facility] 12/31/2019, 5:55 PM

## 2019-12-31 NOTE — Telephone Encounter (Signed)
Met with the patient when she was in the clinic today for her appointment with Dr Newlin. °She said that she is currently living with her mother who needs her assistance as her mother is on continuous O2. ° °The patient has access to a working car and spoke of the expenses that she has related to car payments, insurance, repairs. She also pays for storage unit.  Currently receives about $700/month social security and about $150/month food stamps.  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  ° °Explained the role of this CM as well as the clinic LCSW.  She said she did not need any resources at this time and did not need to speak with the LCSW for support/grief counseling.  Encouraged her to call this clinic if the need for resources, SW support arise in the the future and she said that she understood.  °

## 2019-12-31 NOTE — Patient Instructions (Signed)
Liraglutide injection What is this medicine? LIRAGLUTIDE (LIR a GLOO tide) is used to improve blood sugar control in adults with type 2 diabetes. This medicine may be used with other diabetes medicines. This drug may also reduce the risk of heart attack or stroke if you have type 2 diabetes and risk factors for heart disease. This medicine may be used for other purposes; ask your health care provider or pharmacist if you have questions. COMMON BRAND NAME(S): Victoza What should I tell my health care provider before I take this medicine? They need to know if you have any of these conditions:  endocrine tumors (MEN 2) or if someone in your family had these tumors  gallbladder disease  high cholesterol  history of alcohol abuse problem  history of pancreatitis  kidney disease or if you are on dialysis  liver disease  previous swelling of the tongue, face, or lips with difficulty breathing, difficulty swallowing, hoarseness, or tightening of the throat  stomach problems  thyroid cancer or if someone in your family had thyroid cancer  an unusual or allergic reaction to liraglutide, other medicines, foods, dyes, or preservatives  pregnant or trying to get pregnant  breast-feeding How should I use this medicine? This medicine is for injection under the skin of your upper leg, stomach area, or upper arm. You will be taught how to prepare and give this medicine. Use exactly as directed. Take your medicine at regular intervals. Do not take it more often than directed. It is important that you put your used needles and syringes in a special sharps container. Do not put them in a trash can. If you do not have a sharps container, call your pharmacist or healthcare provider to get one [place] will be given to you by the pharmacist with each prescription and refill. Be sure to read this information carefully each time. This drug comes with INSTRUCTIONS FOR USE. Ask your pharmacist  for directions on how to use this drug. Read the information carefully. Talk to your pharmacist or health care provider if you have questions. Talk to your pediatrician regarding the use of this medicine in children. While this drug may be prescribed for children as young as 28 years of age, precautions do apply. Overdosage: If you think you have taken too much of this medicine contact a poison control center or emergency room at once. NOTE: This medicine is only for you. Do not share this medicine with others. What if I miss a dose? If you miss a dose, take it as soon as you can. If it is almost time for your next dose, take only that dose. Do not take double or extra doses. What may interact with this medicine?  other medicines for diabetes Many medications may cause changes in blood sugar, these include:  alcohol containing beverages  antiviral medicines for HIV or AIDS  aspirin and aspirin-like drugs  certain medicines for blood pressure, heart disease, irregular heart beat  chromium  diuretics  female hormones, such as estrogens or progestins, birth control pills  fenofibrate  gemfibrozil  isoniazid  lanreotide  female hormones or anabolic steroids  MAOIs like Carbex, Eldepryl, Marplan, Nardil, and Parnate  medicines for weight loss  medicines for allergies, asthma, cold, or cough  medicines for depression, anxiety, or psychotic disturbances  niacin  nicotine  NSAIDs, medicines for pain and inflammation, like ibuprofen or naproxen  octreotide  pasireotide  pentamidine  phenytoin  probenecid  quinolone antibiotics such as ciprofloxacin, levofloxacin,  ofloxacin  some herbal dietary supplements  steroid medicines such as prednisone or cortisone  sulfamethoxazole; trimethoprim  thyroid hormones Some medications can hide the warning symptoms of low blood sugar (hypoglycemia). You may need to monitor your blood sugar more closely if you are taking one  of these medications. These include:  beta-blockers, often used for high blood pressure or heart problems (examples include atenolol, metoprolol, propranolol)  clonidine  guanethidine  reserpine This list may not describe all possible interactions. Give your health care provider a list of all the medicines, herbs, non-prescription drugs, or dietary supplements you use. Also tell them if you smoke, drink alcohol, or use illegal drugs. Some items may interact with your medicine. What should I watch for while using this medicine? Visit your doctor or health care professional for regular checks on your progress. Drink plenty of fluids while taking this medicine. Check with your doctor or health care professional if you get an attack of severe diarrhea, nausea, and vomiting. The loss of too much body fluid can make it dangerous for you to take this medicine. A test called the HbA1C (A1C) will be monitored. This is a simple blood test. It measures your blood sugar control over the last 2 to 3 months. You will receive this test every 3 to 6 months. Learn how to check your blood sugar. Learn the symptoms of low and high blood sugar and how to manage them. Always carry a quick-source of sugar with you in case you have symptoms of low blood sugar. Examples include hard sugar candy or glucose tablets. Make sure others know that you can choke if you eat or drink when you develop serious symptoms of low blood sugar, such as seizures or unconsciousness. They must get medical help at once. Tell your doctor or health care professional if you have high blood sugar. You might need to change the dose of your medicine. If you are sick or exercising more than usual, you might need to change the dose of your medicine. Do not skip meals. Ask your doctor or health care professional if you should avoid alcohol. Many nonprescription cough and cold products contain sugar or alcohol. These can affect blood sugar. Pens should  never be shared. Even if the needle is changed, sharing may result in passing of viruses like hepatitis or HIV. Wear a medical ID bracelet or chain, and carry a card that describes your disease and details of your medicine and dosage times. What side effects may I notice from receiving this medicine? Side effects that you should report to your doctor or health care professional as soon as possible:  allergic reactions like skin rash, itching or hives, swelling of the face, lips, or tongue  breathing problems  diarrhea that continues or is severe  lump or swelling on the neck  severe nausea  signs and symptoms of infection like fever or chills; cough; sore throat; pain or trouble passing urine  signs and symptoms of low blood sugar such as feeling anxious, confusion, dizziness, increased hunger, unusually weak or tired, sweating, shakiness, cold, irritable, headache, blurred vision, fast heartbeat, loss of consciousness  signs and symptoms of kidney injury like trouble passing urine or change in the amount of urine  trouble swallowing  unusual stomach upset or pain  vomiting Side effects that usually do not require medical attention (report to your doctor or health care professional if they continue or are bothersome):  constipation  decreased appetite  diarrhea  fatigue  headache  nausea  pain, redness, or irritation at site where injected  stomach upset  stuffy or runny nose This list may not describe all possible side effects. Call your doctor for medical advice about side effects. You may report side effects to FDA at 1-800-FDA-1088. Where should I keep my medicine? Keep out of the reach of children. Store unopened pen in a refrigerator between 2 and 8 degrees C (36 and 46 degrees F). Do not freeze or use if the medicine has been frozen. Protect from light and excessive heat. After you first use the pen, it can be stored at room temperature between 15 and 30 degrees  C (59 and 86 degrees F) or in a refrigerator. Throw away your used pen after 30 days or after the expiration date, whichever comes first. Do not store your pen with the needle attached. If the needle is left on, medicine may leak from the pen. NOTE: This sheet is a summary. It may not cover all possible information. If you have questions about this medicine, talk to your doctor, pharmacist, or health care provider.  2020 Elsevier/Gold Standard (2019-01-23 09:39:47)  

## 2020-01-01 ENCOUNTER — Telehealth: Payer: Self-pay | Admitting: Family Medicine

## 2020-01-01 MED ORDER — FLUCONAZOLE 150 MG PO TABS
150.0000 mg | ORAL_TABLET | Freq: Once | ORAL | 0 refills | Status: AC
Start: 2020-01-01 — End: 2020-01-01

## 2020-01-01 NOTE — Telephone Encounter (Signed)
Diflucan sent to her pharmacy

## 2020-01-01 NOTE — Telephone Encounter (Signed)
Will route to Pcp for review.

## 2020-01-01 NOTE — Telephone Encounter (Signed)
Pt forgot to mention during her appt yesterday that she has a burning and itching sensation in her vaginal area / Pt states it is very uncomfortable and sometimes its so bad it bleeds/ Pt asked to have a call from Dr. Alvis Lemmings Wynelle Link advise

## 2020-01-02 ENCOUNTER — Other Ambulatory Visit: Payer: Self-pay | Admitting: Physician Assistant

## 2020-01-02 DIAGNOSIS — K219 Gastro-esophageal reflux disease without esophagitis: Secondary | ICD-10-CM

## 2020-01-02 NOTE — Telephone Encounter (Signed)
Patient was called and a voicemail was left informing patient to return phone call. 

## 2020-01-23 ENCOUNTER — Other Ambulatory Visit: Payer: Self-pay | Admitting: Physician Assistant

## 2020-01-23 NOTE — Telephone Encounter (Signed)
Requested medication (s) are due for refill today: Yes  Requested medication (s) are on the active medication list: Yes  Last refill:  11/09/18  Future visit scheduled: Yes  Notes to clinic:  Prescription has expired.    Requested Prescriptions  Pending Prescriptions Disp Refills   clopidogrel (PLAVIX) 75 MG tablet [Pharmacy Med Name: CLOPIDOGREL 75 MG TABLET] 90 tablet 1    Sig: TAKE 1 TABLET EVERY DAY      Hematology: Antiplatelets - clopidogrel Failed - 01/23/2020  1:25 PM      Failed - Evaluate AST, ALT within 2 months of therapy initiation.      Failed - AST in normal range and within 360 days    AST  Date Value Ref Range Status  12/10/2019 12 (L) 15 - 41 U/L Final          Failed - HCT in normal range and within 180 days    HCT  Date Value Ref Range Status  12/10/2019 47.5 (H) 36 - 46 % Final   Hematocrit  Date Value Ref Range Status  11/09/2018 42.8 34.0 - 46.6 % Final          Failed - HGB in normal range and within 180 days    Hemoglobin  Date Value Ref Range Status  12/10/2019 16.0 (H) 12.0 - 15.0 g/dL Final  11/17/9483 46.2 11.1 - 15.9 g/dL Final          Passed - ALT in normal range and within 360 days    ALT  Date Value Ref Range Status  12/10/2019 13 0 - 44 U/L Final          Passed - PLT in normal range and within 180 days    Platelets  Date Value Ref Range Status  12/10/2019 382 150 - 400 K/uL Final  11/09/2018 321 150 - 450 x10E3/uL Final          Passed - Valid encounter within last 6 months    Recent Outpatient Visits           3 weeks ago Type 2 diabetes mellitus with hyperglycemia, with long-term current use of insulin (HCC)   [facility] [place], [place], MD   1 month ago Type 2 diabetes mellitus with hyperglycemia, with long-term current use of insulin (HCC)   [facility] [place], [place], MD   3 months ago Thickened nails   [facility]  [place], [place], [place]   5 months ago Urinary tract infection without hematuria, site unspecified   [facility] [address] [facility] [place], Cari S, PA-C   6 months ago Type 2 diabetes mellitus with hyperglycemia, with long-term current use of insulin (HCC)   [facility] [name], MD       Future Appointments             In 4 weeks [name], MD [facility]

## 2020-01-24 ENCOUNTER — Other Ambulatory Visit: Payer: Self-pay | Admitting: Family Medicine

## 2020-01-24 DIAGNOSIS — E1142 Type 2 diabetes mellitus with diabetic polyneuropathy: Secondary | ICD-10-CM

## 2020-01-24 DIAGNOSIS — F32A Depression, unspecified: Secondary | ICD-10-CM

## 2020-01-24 MED ORDER — BUPROPION HCL ER (SR) 150 MG PO TB12: 150.0000 mg | ORAL_TABLET | Freq: Two times a day (BID) | ORAL | 2 refills | Status: DC

## 2020-01-24 MED ORDER — GABAPENTIN 300 MG PO CAPS: 300.0000 mg | ORAL_CAPSULE | Freq: Two times a day (BID) | ORAL | 2 refills | Status: DC

## 2020-01-24 NOTE — Telephone Encounter (Signed)
Medication Refill - Medication: gabapentin (NEURONTIN) 300 MG capsule  buPROPion (WELLBUTRIN SR) 150 MG 12 hr tablet  Pt is completley out of her Rx   Has the patient contacted their pharmacy? Yes.   (Agent: If no, request that the patient contact the pharmacy for the refill.) (Agent: If yes, when and what did the pharmacy advise?)  Preferred Pharmacy (with phone number or street name): [id number] [place] - [address] [facility] [address]  [address] [place] [address]  Phone: [phone] Fax: [phone]      Agent: Please be advised that RX refills may take up to 3 business days. We ask that you follow-up with your pharmacy.

## 2020-02-04 ENCOUNTER — Ambulatory Visit: Payer: Self-pay

## 2020-02-04 ENCOUNTER — Ambulatory Visit: Payer: Medicaid Other | Attending: Family Medicine | Admitting: Internal Medicine

## 2020-02-04 ENCOUNTER — Encounter: Payer: Self-pay | Admitting: Internal Medicine

## 2020-02-04 ENCOUNTER — Other Ambulatory Visit: Payer: Self-pay

## 2020-02-04 VITALS — BP 131/84 | HR 65 | Temp 97.0°F | Resp 16 | Wt 148.0 lb

## 2020-02-04 DIAGNOSIS — E1165 Type 2 diabetes mellitus with hyperglycemia: Secondary | ICD-10-CM | POA: Diagnosis not present

## 2020-02-04 DIAGNOSIS — Z794 Long term (current) use of insulin: Secondary | ICD-10-CM | POA: Diagnosis not present

## 2020-02-04 LAB — GLUCOSE, POCT (MANUAL RESULT ENTRY): POC Glucose: 251 mg/dl — AB (ref 70–99)

## 2020-02-04 MED ORDER — METHOCARBAMOL 500 MG PO TABS: 500.0000 mg | ORAL_TABLET | Freq: Two times a day (BID) | ORAL | 0 refills | Status: DC | PRN

## 2020-02-04 MED ORDER — MELOXICAM 7.5 MG PO TABS
7.5000 mg | ORAL_TABLET | Freq: Every day | ORAL | 0 refills | Status: DC
Start: 2020-02-04 — End: 2021-05-05

## 2020-02-04 NOTE — Telephone Encounter (Signed)
Called patient and scheduled an appt with Dr. Cato Mulligan today 9/13.   Copied from CRM [id number]. Topic: General - Other >> Feb 04, 2020  9:06 AM Jaquita Rector A wrote: Reason for CRM: Patient called to inform Dr Alvis Lemmings that she fell down some steps on 02/03/20 and feeling pain in her legs and back was not seen at the ER or by anyone. Requesting that Dr Alvis Lemmings send Rx to her pharmacy for muscle relaxer's,  please advise  Ph# [phone]

## 2020-02-04 NOTE — Progress Notes (Signed)
Walking down wood steps while in socks. She slipped and fell down 8-9 steps. Landed on right buttock and that's where the pain is localized. She has some pain in the upper right leg but it does not radiate further.   Has increased pain getting up and down. Lying is better than standing. She is able to sleep ok but wakes up with any movement due to pain.   Acute low back pain from moderate trauma. No concerning s/s Short trial of nsaid (renal function normal) Short trial robaxin.

## 2020-02-04 NOTE — Patient Instructions (Signed)
Use ice to the area of most pain 3 times daily

## 2020-02-04 NOTE — Telephone Encounter (Signed)
Attempted to reach pt. She has an appointment today.

## 2020-02-04 NOTE — Progress Notes (Signed)
Fell down  8 steps in home.  C/o lower back pain and right leg pain.

## 2020-02-04 NOTE — Telephone Encounter (Signed)
Patient was seen by Dr.Swords today.

## 2020-02-15 ENCOUNTER — Encounter: Payer: Self-pay | Admitting: Podiatry

## 2020-02-15 ENCOUNTER — Ambulatory Visit (INDEPENDENT_AMBULATORY_CARE_PROVIDER_SITE_OTHER): Payer: Medicaid Other | Admitting: Podiatry

## 2020-02-15 ENCOUNTER — Other Ambulatory Visit: Payer: Self-pay

## 2020-02-15 DIAGNOSIS — E1142 Type 2 diabetes mellitus with diabetic polyneuropathy: Secondary | ICD-10-CM | POA: Diagnosis not present

## 2020-02-15 DIAGNOSIS — M79674 Pain in right toe(s): Secondary | ICD-10-CM

## 2020-02-15 DIAGNOSIS — B351 Tinea unguium: Secondary | ICD-10-CM

## 2020-02-15 DIAGNOSIS — M79675 Pain in left toe(s): Secondary | ICD-10-CM

## 2020-02-15 NOTE — Progress Notes (Signed)
  Subjective:  Patient ID: Sarah Charles, female    DOB: 1960-09-03,  MRN: [id number]  Chief Complaint  Patient presents with  . Nail Problem    nail trim   59 y.o. female returns for the above complaint.  Patient presents with thickened elongated dystrophic toenails x10.  Patient states that she is a diabetic that has been going on for 2 to 3 years.   There is no swelling or coldness associated.  They are painful to touch.  She would like to have them debrided down as she is not able to debride down herself.  She denies any other acute complaints.  She has not seen anyone else prior to seeing me.  Objective:  There were no vitals filed for this visit. Podiatric Exam: Vascular: dorsalis pedis and posterior tibial pulses are palpable bilateral. Capillary return is immediate. Temperature gradient is WNL. Skin turgor WNL  Sensorium: Normal Semmes Weinstein monofilament test. Normal tactile sensation bilaterally. Nail Exam: Pt has thick disfigured discolored nails with subungual debris noted bilateral entire nail hallux through fifth toenails.  Pain on palpation to the nails. Ulcer Exam: There is no evidence of ulcer or pre-ulcerative changes or infection. Orthopedic Exam: Muscle tone and strength are WNL. No limitations in general ROM. No crepitus or effusions noted. HAV  B/L.  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 Skin: No Porokeratosis. No infection or ulcers    Assessment & Plan:   1. Pain due to onychomycosis of toenails of both feet   2. Diabetic polyneuropathy associated with type 2 diabetes mellitus (HCC)     Patient was evaluated and treated and all questions answered.  Onychomycosis with pain  -Nails palliatively debrided as below. -Educated on self-care  Procedure: Nail Debridement Rationale: pain  Type of Debridement: manual, Pavey debridement. Instrumentation: Nail nipper, rotary burr. Number of Nails: 10  Procedures and Treatment: Consent by patient was obtained for treatment  procedures. The patient understood the discussion of treatment and procedures well. All questions were answered thoroughly reviewed. Debridement of mycotic and hypertrophic toenails, 1 through 5 bilateral and clearing of subungual debris. No ulceration, no infection noted.  Return Visit-Office Procedure: Patient instructed to return to the office for a follow up visit 3 months for continued evaluation and treatment.  Nicholes Rough, DPM    No follow-ups on file.

## 2020-02-20 ENCOUNTER — Other Ambulatory Visit: Payer: Self-pay

## 2020-02-20 ENCOUNTER — Ambulatory Visit: Payer: Medicaid Other | Attending: Family Medicine | Admitting: Family Medicine

## 2020-02-20 ENCOUNTER — Encounter: Payer: Self-pay | Admitting: Family Medicine

## 2020-02-20 DIAGNOSIS — E1165 Type 2 diabetes mellitus with hyperglycemia: Secondary | ICD-10-CM | POA: Diagnosis not present

## 2020-02-20 DIAGNOSIS — Z794 Long term (current) use of insulin: Secondary | ICD-10-CM | POA: Diagnosis not present

## 2020-02-20 NOTE — Progress Notes (Signed)
Virtual Visit via Telephone Note  I connected with [name], on 02/20/2020 at 2:25 PM by telephone due to the COVID-19 pandemic and verified that I am speaking with the correct person using two identifiers.   Consent: I discussed the limitations, risks, security and privacy concerns of performing an evaluation and management service by telephone and the availability of in person appointments. I also discussed with the patient that there may be a patient responsible charge related to this service. The patient expressed understanding and agreed to proceed.   Location of Patient: Home  Location of Provider: Clinic   Persons participating in Telemedicine visit: Nailah Luepke Farrington-CMA Dr. Margarita Rana     History of Present Illness: Sarah Charles is a 59 year old female with a history of type 2 diabetes mellitus (A1c 11.2),noncompliance,hypertension, hyperlipidemia, GERD, CAD(s/p DES)who presents today fora follow-up visit.  Sugars run 114-175 and she denies hypoglycemia. Complaint with Victoza and is on 0.17m; also compliant with Lantus.  Denies neuropathy symptoms, visual concerns. Denies presence of chest pain, dyspnea or pedal edema.   Past Medical History:  Diagnosis Date  . Arthritis   . Coronary artery calcification seen on CT scan    a. 07/2017 noted on CT abd.  . Depression   . GERD (gastroesophageal reflux disease)   . Hyperlipidemia   . Hypertension   . Obesity   . Sleep apnea    a. Does not use CPAP "cause i dont think I need it anymore".  . Tobacco abuse   . Type II diabetes mellitus (HCC)    Allergies  Allergen Reactions  . Cymbalta [Duloxetine Hcl] Nausea Only    Nausea, lack of therapeutic effect    Current Outpatient Medications on File Prior to Visit  Medication Sig Dispense Refill  . amLODipine (NORVASC) 5 MG tablet Take 1 tablet (5 mg total) by mouth daily. 180 tablet 1  . aspirin EC 81 MG tablet Take 81 mg by mouth daily.    . Blood  Glucose Monitoring Suppl (ACCU-CHEK GUIDE ME) w/Device KIT 1 each by Does not apply route 3 (three) times daily. 1 kit 0  . buPROPion (WELLBUTRIN SR) 150 MG 12 hr tablet Take 1 tablet (150 mg total) by mouth 2 (two) times daily. 60 tablet 2  . cetirizine (ZYRTEC) 10 MG tablet TAKE 1 TABLET BY MOUTH EVERY DAY 30 tablet 2  . clopidogrel (PLAVIX) 75 MG tablet TAKE 1 TABLET EVERY DAY 90 tablet 1  . dicyclomine (BENTYL) 10 MG capsule Take 1 capsule by mouth daily.    . fenofibrate (TRICOR) 145 MG tablet Take 1 tablet (145 mg total) by mouth daily. 90 tablet 3  . FLUoxetine (PROZAC) 20 MG capsule Take 1 capsule (20 mg total) by mouth daily. 30 capsule 6  . gabapentin (NEURONTIN) 300 MG capsule Take 1 capsule (300 mg total) by mouth 2 (two) times daily. 60 capsule 2  . glucose blood (ACCU-CHEK GUIDE) test strip Use as instructed 100 each 12  . insulin glargine (LANTUS SOLOSTAR) 100 UNIT/ML Solostar Pen Inject 55 Units into the skin at bedtime. 30 mL 3  . Insulin Pen Needle (PEN NEEDLES) 31G X 5 MM MISC 1 each by Does not apply route daily. 100 each 11  . Lancets Misc. (ACCU-CHEK FASTCLIX LANCET) KIT As directed 1 kit 0  . liraglutide (VICTOZA) 18 MG/3ML SOPN Start 0.651mSQ once a day for 7 days, then increase to 1.65m19mnce a day 6 mL 3  . lisinopril (ZESTRIL) 20 MG tablet  Take 1 tablet (20 mg total) by mouth daily. 90 tablet 1  . meloxicam (MOBIC) 7.5 MG tablet Take 1 tablet (7.5 mg total) by mouth daily. 10 tablet 0  . methocarbamol (ROBAXIN) 500 MG tablet Take 1 tablet (500 mg total) by mouth 2 (two) times daily as needed (back pain). 14 tablet 0  . metoCLOPramide (REGLAN) 10 MG tablet TAKE 1 TABLET BY MOUTH EVERY EIGHT HOURS AS NEEDED FOR NAUSEA OR VOMITING. 40 tablet 0  . Multiple Vitamin (MULTIVITAMIN) tablet Take 1 tablet by mouth daily.    . nitroGLYCERIN (NITROSTAT) 0.4 MG SL tablet Place 0.4 mg under the tongue every 5 (five) minutes as needed for chest pain.    . pantoprazole (PROTONIX) 40 MG  tablet TAKE 1 TABLET EVERY DAY 90 tablet 2  . rosuvastatin (CRESTOR) 40 MG tablet Take 1 tablet (40 mg total) by mouth daily. 90 tablet 1  . sitaGLIPtin-metformin (JANUMET) 50-1000 MG tablet Take 1 tablet by mouth 2 (two) times daily with a meal. 180 tablet 1  . traZODone (DESYREL) 50 MG tablet Take 1 tablet (50 mg total) by mouth at bedtime as needed for sleep. 30 tablet 2  . vitamin B-12 (CYANOCOBALAMIN) 500 MCG tablet Take 500 mcg by mouth daily.    . vitamin C (ASCORBIC ACID) 500 MG tablet Take 500 mg by mouth daily.    . metoprolol tartrate (LOPRESSOR) 25 MG tablet Take 1 tablet (25 mg total) by mouth 2 (two) times daily. 180 tablet 1   No current facility-administered medications on file prior to visit.    Observations/Objective: Awake, alert, and 2x3 Not in acute distress  Lab Results  Component Value Date   HGBA1C 11.1 (A) 12/10/2019    CMP Latest Ref Rng & Units 12/10/2019 07/23/2019 11/09/2018  Glucose 70 - 99 mg/dL 537(HH) 352(H) 552(HH)  BUN 6 - 20 mg/dL _0 Creatinine 0.44 - 1.00 mg/dL 0.78 0.69 0.68  Sodium 135 - 145 mmol/L 132(L) 138 136  Potassium 3.5 - 5.1 mmol/L 4.4 4.3 4.6  Chloride 98 - 111 mmol/L 95(L) 99 98  CO2 22 - 32 mmol/L _1 Calcium 8.9 - 10.3 mg/dL 10.1 10.0 9.6  Total Protein 6.5 - 8.1 g/dL 7.2 6.9 6.6  Total Bilirubin 0.3 - 1.2 mg/dL 0.8 0.2 0.2  Alkaline Phos 38 - 126 U/L 119 117 144(H)  AST 15 - 41 U/L 12(L) 12 17  ALT 0 - 44 U/L _2 Assessment and Plan: 1. Type 2 diabetes mellitus with hyperglycemia, with long-term current use of insulin (HCC) Uncontrolled with A1c of 11.1 Blood sugar log reveals improvement Advised to titrate Victoza up to 1.2 mg for the next 1 week and then finally up to 1.8 mg Continue Lantus She will return in 6 weeks for an in person visit and a repeat A1c   Follow Up Instructions: 6 weeks for follow-up on diabetes mellitus   I discussed the assessment and treatment plan with the patient. The  patient was provided an opportunity to ask questions and all were answered. The patient agreed with the plan and demonstrated an understanding of the instructions.   The patient was advised to call back or seek an in-person evaluation if the symptoms worsen or if the condition fails to improve as anticipated.     I provided 12 minutes total of non-face-to-face time during this encounter including median intraservice time, reviewing previous notes, investigations, ordering medications, medical decision making, coordinating care  and patient verbalized understanding at the end of the visit.     Charlott Rakes, MD, FAAFP. [facility] [place], [place]   02/20/2020, 2:25 PM

## 2020-02-25 ENCOUNTER — Other Ambulatory Visit: Payer: Self-pay | Admitting: Family Medicine

## 2020-02-25 NOTE — Telephone Encounter (Signed)
Unable to reach. Medication is OTC. Not a prescription.

## 2020-02-25 NOTE — Telephone Encounter (Signed)
Copied from CRM [id number]. Topic: Quick Communication - Rx Refill/Question >> Feb 25, 2020  3:05 PM Marylen Ponto wrote: Medication: Fish Oil-Cholecalciferol (FISH OIL + D3 PO)  Has the patient contacted their pharmacy? no  Preferred Pharmacy (with phone number or street name): [facility] - [place], [place] - [address]  Phone: [phone]  Fax: [phone]  Agent: Please be advised that RX refills may take up to 3 business days. We ask that you follow-up with your pharmacy.

## 2020-02-25 NOTE — Telephone Encounter (Signed)
Not on active med list- Routing CHW

## 2020-03-27 ENCOUNTER — Telehealth: Payer: Self-pay | Admitting: Family Medicine

## 2020-03-27 NOTE — Telephone Encounter (Signed)
I attempted to reach Ms.Challis today to get her scheduled with the MM Care Team. No one answered the number and the voicemail was not set up. I will make another attempt to reach her.

## 2020-04-02 ENCOUNTER — Other Ambulatory Visit: Payer: Self-pay | Admitting: Family Medicine

## 2020-04-02 NOTE — Telephone Encounter (Signed)
Requested medication (s) are due for refill today: yes  Requested medication (s) are on the active medication list: yes  Last refill:  02/04/20  Future visit scheduled: no  Notes to clinic:  not delegated    Requested Prescriptions  Pending Prescriptions Disp Refills   methocarbamol (ROBAXIN) 500 MG tablet [Pharmacy Med Name: METHOCARBAMOL 500 MG TABLET] 14 tablet 0    Sig: TAKE 1 TABLET BY MOUTH TWICE A DAY AS NEEDED (BACK PAIN)      Not Delegated - Analgesics:  Muscle Relaxants Failed - 04/02/2020  2:55 PM      Failed - This refill cannot be delegated      Passed - Valid encounter within last 6 months    Recent Outpatient Visits           1 month ago Type 2 diabetes mellitus with hyperglycemia, with long-term current use of insulin (HCC)   [facility] [place], [place], [place]   1 month ago Type 2 diabetes mellitus with hyperglycemia, with long-term current use of insulin (HCC)   [facility] [place], Valetta Mole, MD   3 months ago Type 2 diabetes mellitus with hyperglycemia, with long-term current use of insulin (HCC)   [facility] [place], [place], [place]   3 months ago Type 2 diabetes mellitus with hyperglycemia, with long-term current use of insulin (HCC)   [facility] [name], MD   5 months ago Thickened nails   [facility] [place], [place], [place]

## 2020-04-02 NOTE — Telephone Encounter (Signed)
Requested medication (s) are due for refill today - -yes  Requested medication (s) are on the active medication list -yes  Future visit scheduled -no  Last refill: 02/04/20  Notes to clinic: Request non delegated Rx  Requested Prescriptions  Pending Prescriptions Disp Refills   methocarbamol (ROBAXIN) 500 MG tablet 14 tablet 0    Sig: Take 1 tablet (500 mg total) by mouth 2 (two) times daily as needed (back pain).      Not Delegated - Analgesics:  Muscle Relaxants Failed - 04/02/2020  2:51 PM      Failed - This refill cannot be delegated      Passed - Valid encounter within last 6 months    Recent Outpatient Visits           1 month ago Type 2 diabetes mellitus with hyperglycemia, with long-term current use of insulin (HCC)   [facility] [place], [place], [place]   1 month ago Type 2 diabetes mellitus with hyperglycemia, with long-term current use of insulin (HCC)   [facility] [place], Valetta Mole, MD   3 months ago Type 2 diabetes mellitus with hyperglycemia, with long-term current use of insulin (HCC)   [facility] [place], Odette [place], MD   3 months ago Type 2 diabetes mellitus with hyperglycemia, with long-term current use of insulin (HCC)   [facility] [place], [place], MD   5 months ago Thickened nails   [facility] [place], [place], [place]                  Requested Prescriptions  Pending Prescriptions Disp Refills   methocarbamol (ROBAXIN) 500 MG tablet 14 tablet 0    Sig: Take 1 tablet (500 mg total) by mouth 2 (two) times daily as needed (back pain).      Not Delegated - Analgesics:  Muscle Relaxants Failed - 04/02/2020  2:51 PM      Failed - This refill cannot be delegated      Passed - Valid encounter within last 6 months    Recent Outpatient Visits           1 month ago Type 2 diabetes mellitus with hyperglycemia, with  long-term current use of insulin (HCC)   [facility] [place], [place], MD   1 month ago Type 2 diabetes mellitus with hyperglycemia, with long-term current use of insulin (HCC)   [facility] [place], Valetta Mole, MD   3 months ago Type 2 diabetes mellitus with hyperglycemia, with long-term current use of insulin (HCC)   [facility] [place], [place], MD   3 months ago Type 2 diabetes mellitus with hyperglycemia, with long-term current use of insulin (HCC)   [facility] [name], MD   5 months ago Thickened nails   [facility] [place], [place], [place]

## 2020-04-02 NOTE — Telephone Encounter (Signed)
Pt request refill  methocarbamol (ROBAXIN) 500 MG tablet  [id number] [place] [place] - [address] Phone:  [phone]  Fax:  [phone]      Pt would like to get today if possible

## 2020-04-04 MED ORDER — METHOCARBAMOL 500 MG PO TABS: 500.0000 mg | ORAL_TABLET | Freq: Two times a day (BID) | ORAL | 0 refills | Status: DC | PRN

## 2020-04-14 NOTE — Progress Notes (Signed)
Patient did not show for appointment.   

## 2020-04-15 ENCOUNTER — Encounter: Payer: Medicaid Other | Admitting: Family

## 2020-04-15 DIAGNOSIS — E1165 Type 2 diabetes mellitus with hyperglycemia: Secondary | ICD-10-CM

## 2020-05-02 ENCOUNTER — Telehealth: Payer: Self-pay | Admitting: Family Medicine

## 2020-05-02 NOTE — Telephone Encounter (Signed)
Today was the 2nd attempt to reach Sarah Charles to get her scheduled with the Managed Medicaid Care Team. I was not able to leave a message. I will make a third attempt in the next 7-14 days.

## 2020-05-09 ENCOUNTER — Other Ambulatory Visit: Payer: Self-pay | Admitting: Family Medicine

## 2020-05-09 DIAGNOSIS — Z1231 Encounter for screening mammogram for malignant neoplasm of breast: Secondary | ICD-10-CM

## 2020-05-13 ENCOUNTER — Other Ambulatory Visit: Payer: Self-pay

## 2020-05-13 ENCOUNTER — Ambulatory Visit
Admission: RE | Admit: 2020-05-13 | Discharge: 2020-05-13 | Disposition: A | Discharge status: Home / Self Care | Payer: Medicaid Other | Source: Ambulatory Visit | Attending: Family Medicine | Admitting: Family Medicine

## 2020-05-13 DIAGNOSIS — Z1231 Encounter for screening mammogram for malignant neoplasm of breast: Secondary | ICD-10-CM | POA: Diagnosis not present

## 2020-05-14 ENCOUNTER — Ambulatory Visit: Payer: Medicaid Other

## 2020-05-18 ENCOUNTER — Other Ambulatory Visit: Payer: Self-pay | Admitting: Cardiology

## 2020-05-18 ENCOUNTER — Other Ambulatory Visit: Payer: Self-pay | Admitting: Physician Assistant

## 2020-05-18 NOTE — Telephone Encounter (Signed)
Requested Prescriptions  Pending Prescriptions Disp Refills  . traZODone (DESYREL) 50 MG tablet [Pharmacy Med Name: TRAZODONE 50 MG TABLET] 30 tablet 2    Sig: TAKE 1 TABLET AT BEDTIME AS NEEDED FOR SLEEP     Psychiatry: Antidepressants - Serotonin Modulator Passed - 05/18/2020  4:51 AM      Passed - Completed PHQ-2 or PHQ-9 in the last 360 days      Passed - Valid encounter within last 6 months    Recent Outpatient Visits          2 months ago Type 2 diabetes mellitus with hyperglycemia, with long-term current use of insulin (HCC)   [facility] [place], [place], [place]   3 months ago Type 2 diabetes mellitus with hyperglycemia, with long-term current use of insulin (HCC)   [facility] [place], Valetta Mole, MD   4 months ago Type 2 diabetes mellitus with hyperglycemia, with long-term current use of insulin (HCC)   [facility] [place], [place], MD   5 months ago Type 2 diabetes mellitus with hyperglycemia, with long-term current use of insulin (HCC)   [facility] [name], MD   7 months ago Thickened nails   [facility] [place], [place], [place]

## 2020-05-20 NOTE — Telephone Encounter (Signed)
Attempted to reach patient. No answer. Lvm  

## 2020-05-20 NOTE — Telephone Encounter (Signed)
Pt is supposed to be taking Vascepa per cardiology's last records. Looks like CMA at her PCP office removed it from her med list during virtual visit for UTI and no details were listed. Would confirm with pt that she did not have any side effects to therapy and if she did not, refill should be sent in.

## 2020-05-22 ENCOUNTER — Telehealth: Payer: Self-pay

## 2020-05-22 NOTE — Telephone Encounter (Signed)
-----   Message from [name], MD sent at 05/20/2020  8:36 AM EST ----- Mammogram is negative for malignancy

## 2020-05-22 NOTE — Telephone Encounter (Signed)
Patient name and DOB has been verified Patient was informed of lab results. Patient had no questions.  

## 2020-05-28 ENCOUNTER — Telehealth: Payer: Self-pay

## 2020-05-28 NOTE — Telephone Encounter (Signed)
**Note De-Identified  Obfuscation** [facility]. Per message from covermymeds this PA has already been done and denied as follows: [name] (Key: [id number]) Vascepa 1GM capsules [employer] Electronic Prior Authorization Form (2017 NCPDP) Determination: Unfavorable 6 days ago Message from Plan Request Reference Number: [id number]. VASCEPA CAP 1GM is denied for not meeting the prior authorization requirement(s).  I will forward this message to Dr Delton See and her nurse for advisement to the pt.

## 2020-05-28 NOTE — Telephone Encounter (Signed)
Will also include Pharmacist in lipid clinic on this message, for the pt is followed in their clinic.

## 2020-05-29 NOTE — Telephone Encounter (Signed)
Called patient to see if she has been taking Vascepa. Unsure if patient ever started taking this medication. Vascepa is a non-preferred medication for medicaid. They only pay for generic lovaza when TG are >500. Last TG were 228.   Will need to assess compliance to her other cholesterol medications as well. TG are high due to poorly controlled DM.

## 2020-05-30 NOTE — Telephone Encounter (Signed)
Called and spoke with patient. She states that at some point in time she was taking Vascepa. She is currently taking fish oil from [employer]. She was not taking rosuvastatin for a little bit because she didn't have the money to pick it up, but now she is taking it. Will try resubmitting PA Resubmitted 1/7

## 2020-06-01 ENCOUNTER — Emergency Department (HOSPITAL_COMMUNITY)
Admission: EM | Admit: 2020-06-01 | Discharge: 2020-06-01 | Disposition: A | Discharge status: Home / Self Care | Payer: Medicaid Other | Attending: Emergency Medicine | Admitting: Emergency Medicine

## 2020-06-01 ENCOUNTER — Emergency Department (HOSPITAL_COMMUNITY): Payer: Medicaid Other

## 2020-06-01 ENCOUNTER — Other Ambulatory Visit: Payer: Self-pay

## 2020-06-01 DIAGNOSIS — Z5321 Procedure and treatment not carried out due to patient leaving prior to being seen by health care provider: Secondary | ICD-10-CM | POA: Insufficient documentation

## 2020-06-01 DIAGNOSIS — R109 Unspecified abdominal pain: Secondary | ICD-10-CM | POA: Insufficient documentation

## 2020-06-01 DIAGNOSIS — Z9049 Acquired absence of other specified parts of digestive tract: Secondary | ICD-10-CM | POA: Diagnosis not present

## 2020-06-01 DIAGNOSIS — I700 Atherosclerosis of aorta: Secondary | ICD-10-CM | POA: Diagnosis not present

## 2020-06-01 DIAGNOSIS — N200 Calculus of kidney: Secondary | ICD-10-CM | POA: Insufficient documentation

## 2020-06-01 DIAGNOSIS — R222 Localized swelling, mass and lump, trunk: Secondary | ICD-10-CM | POA: Diagnosis not present

## 2020-06-01 LAB — URINALYSIS, ROUTINE W REFLEX MICROSCOPIC
Bilirubin Urine: NEGATIVE
Glucose, UA: 50 mg/dL — AB
Hgb urine dipstick: NEGATIVE
Ketones, ur: NEGATIVE mg/dL
Nitrite: NEGATIVE
Protein, ur: NEGATIVE mg/dL
Specific Gravity, Urine: 1.005 (ref 1.005–1.030)
pH: 7 (ref 5.0–8.0)

## 2020-06-01 LAB — CBC
HCT: 41.1 % (ref 36.0–46.0)
Hemoglobin: 13.5 g/dL (ref 12.0–15.0)
MCH: 30.1 pg (ref 26.0–34.0)
MCHC: 32.8 g/dL (ref 30.0–36.0)
MCV: 91.5 fL (ref 80.0–100.0)
Platelets: 268 10*3/uL (ref 150–400)
RBC: 4.49 MIL/uL (ref 3.87–5.11)
RDW: 12.9 % (ref 11.5–15.5)
WBC: 7.8 10*3/uL (ref 4.0–10.5)
nRBC: 0 % (ref 0.0–0.2)

## 2020-06-01 LAB — BASIC METABOLIC PANEL
Anion gap: 10 (ref 5–15)
BUN: 12 mg/dL (ref 6–20)
CO2: 28 mmol/L (ref 22–32)
Calcium: 9.3 mg/dL (ref 8.9–10.3)
Chloride: 101 mmol/L (ref 98–111)
Creatinine, Ser: 0.61 mg/dL (ref 0.44–1.00)
GFR, Estimated: >60 mL/min (ref >60)
Glucose, Bld: 214 mg/dL — ABNORMAL HIGH (ref 70–99)
Potassium: 4.1 mmol/L (ref 3.5–5.1)
Sodium: 139 mmol/L (ref 135–145)

## 2020-06-01 NOTE — ED Triage Notes (Signed)
Pt presents to ED POV. Pt c/o R flank pain. Pt reports that pain began suddenly and is intermittent. Pt denies hx of kidney stones and any other GU s/s

## 2020-06-02 ENCOUNTER — Encounter: Payer: Self-pay | Admitting: Podiatry

## 2020-06-02 ENCOUNTER — Ambulatory Visit: Payer: Medicaid Other | Admitting: Podiatry

## 2020-06-02 DIAGNOSIS — B351 Tinea unguium: Secondary | ICD-10-CM | POA: Diagnosis not present

## 2020-06-02 DIAGNOSIS — E1142 Type 2 diabetes mellitus with diabetic polyneuropathy: Secondary | ICD-10-CM

## 2020-06-02 DIAGNOSIS — L309 Dermatitis, unspecified: Secondary | ICD-10-CM

## 2020-06-02 DIAGNOSIS — M79675 Pain in left toe(s): Secondary | ICD-10-CM

## 2020-06-02 DIAGNOSIS — M79674 Pain in right toe(s): Secondary | ICD-10-CM

## 2020-06-02 NOTE — Telephone Encounter (Signed)
Icosapent ethyl denied. Will work on Copy. Insurance states will cover if patient has tried and failed gemfibrozil, however gemfibrozil is CI with statin use.

## 2020-06-03 ENCOUNTER — Other Ambulatory Visit: Payer: Self-pay | Admitting: Family Medicine

## 2020-06-03 NOTE — Telephone Encounter (Addendum)
Pt returned call, states she hasn't received anything in the mail from her insurance but she's been staying with a friend and the form likely would have gone to her mom's house. Advised her to see if she's received any information from her insurance company as she'll have to sign the appeals form. She will call us back.  It also does not look like the appeals form requires pt signature so I will fax this over as well to see if it's sufficient.

## 2020-06-03 NOTE — Progress Notes (Signed)
Subjective:   Patient ID: Sarah Charles, female   DOB: 60 y.o.   MRN: [id number]   HPI Patient presents with discoloration concerns about 2 red spots on her feet plantar and also has severe nail disease 1-5 both feet she cannot take care of they get thick painful and she does have risk factors   ROS      Objective:  Physical Exam  Neurovascular status mildly diminished with diminished Schleifer dull vibratory and pulses PT DP with slight redness plantar aspect right and left foot with no active drainage no proximal edema erythema drainage noted with thick yellow brittle nailbeds 1-5 both feet     Assessment:  Chronic mycotic nail infection 1-5 both feet with dermatitis bilateral that does not appear to be infected or other pathology     Plan:  H&P all conditions reviewed debrided nailbeds 1-5 both feet with no iatrogenic bleeding and recommended utilization of creams for the plantar feet and if any changes were to occur to let us know right away with education rendered to patient with numerous questions answered today

## 2020-06-03 NOTE — Telephone Encounter (Signed)
Patient will need to sign the appeal request form and give [place] permission to appeal on her behalf.  Called and LVM for pt to call back. We have a copy of the form if she did not get it in the mail yet.

## 2020-06-05 ENCOUNTER — Other Ambulatory Visit: Payer: Self-pay | Admitting: Family Medicine

## 2020-06-05 ENCOUNTER — Other Ambulatory Visit: Payer: Self-pay | Admitting: Physician Assistant

## 2020-06-05 DIAGNOSIS — E1165 Type 2 diabetes mellitus with hyperglycemia: Secondary | ICD-10-CM

## 2020-06-05 DIAGNOSIS — Z794 Long term (current) use of insulin: Secondary | ICD-10-CM

## 2020-06-09 ENCOUNTER — Other Ambulatory Visit: Payer: Self-pay | Admitting: Family Medicine

## 2020-06-10 ENCOUNTER — Ambulatory Visit: Payer: Medicaid Other

## 2020-06-10 ENCOUNTER — Other Ambulatory Visit: Payer: Self-pay | Admitting: Obstetrics and Gynecology

## 2020-06-10 ENCOUNTER — Other Ambulatory Visit: Payer: Self-pay

## 2020-06-10 NOTE — Patient Outreach (Signed)
Medicaid Managed Care   Nurse Care Manager Note  06/10/2020 Name:  Sarah Charles MRN:  [id number] DOB:  July 23, 1960  Sarah Charles is an 60 y.o. year old female who is a primary patient of Charlott Rakes, MD.  The [facility] Managed Care Coordination team was consulted for assistance with:    chronic healthcare management needs.  Sarah Charles was given information about Medicaid Managed Care Coordination team services today. [name] agreed to services and verbal consent obtained.  Engaged with patient by telephone for initial visit in response to provider referral for case management and/or care coordination services.   Assessments/Interventions:  Review of past medical history, allergies, medications, health status, including review of consultants reports, laboratory and other test data, was performed as part of comprehensive evaluation and provision of chronic care management services.  SDOH (Social Determinants of Health) assessments and interventions performed:   Care Plan  Allergies  Allergen Reactions  . Cymbalta [Duloxetine Hcl] Nausea Only    Nausea, lack of therapeutic effect    Medications Reviewed Today    Reviewed by Gayla Medicus, RN (Registered Nurse) on 06/10/20 at 25  Med List Status: <None>  Medication Order Taking? Sig Documenting Provider Last Dose Status Informant  amLODipine (NORVASC) 5 MG tablet [id number]  Take 1 tablet (5 mg total) by mouth daily. Charlott Rakes, MD  Expired 03/30/20 2359   aspirin EC 81 MG tablet [id number] Yes Take 81 mg by mouth daily. [provider] Taking Active   B-D UF III MINI PEN NEEDLES 31G X 5 MM MISC [id number] Yes USE AS DIRECTED Charlott Rakes, MD Taking Active   Blood Glucose Monitoring Suppl (ACCU-CHEK GUIDE ME) w/Device KIT [id number] Yes 1 each by Does not apply route 3 (three) times daily. Freeman Caldron M, PA-C Taking Active   buPROPion [facility] SR) 150 MG 12 hr tablet [id number] Yes Take 1 tablet (150 mg total) by  mouth 2 (two) times daily. Charlott Rakes, MD Taking Active   cetirizine (ZYRTEC) 10 MG tablet [id number] Yes TAKE 1 TABLET BY MOUTH EVERY DAY Charlott Rakes, MD Taking Active   clopidogrel (PLAVIX) 75 MG tablet [id number] Yes TAKE 1 TABLET EVERY DAY Newlin, Enobong, MD Taking Active   dicyclomine (BENTYL) 10 MG capsule [id number] Yes Take 1 capsule by mouth daily. [provider] Taking Active   fenofibrate (TRICOR) 145 MG tablet [id number] Yes Take 1 tablet (145 mg total) by mouth daily. Dorothy Spark, MD Taking Active   FLUoxetine (PROZAC) 20 MG capsule [id number] Yes TAKE 1 CAPSULE BY MOUTH EVERY DAY Charlott Rakes, MD Taking Active   gabapentin (NEURONTIN) 300 MG capsule [id number] Yes Take 1 capsule (300 mg total) by mouth 2 (two) times daily. Charlott Rakes, MD Taking Active   glucose blood (ACCU-CHEK GUIDE) test strip [id number] Yes USE AS DIRECTED [name], PA-C Taking Active   insulin glargine (LANTUS SOLOSTAR) 100 UNIT/ML Solostar Pen [id number] Yes Inject 55 Units into the skin at bedtime. Charlott Rakes, MD Taking Active   Lancets Misc. (ACCU-CHEK FASTCLIX LANCET) KIT [id number] Yes As directed [name], PA-C Taking Active   liraglutide (VICTOZA) 18 MG/3ML SOPN [id number] Yes Start 0.32m SQ once a day for 7 days, then increase to 1.238monce a day NeCharlott RakesMD Taking Active   lisinopril (ZESTRIL) 20 MG tablet [id number] Take 1 tablet (20 mg total) by mouth daily. NeCharlott RakesMD Taking Active   meloxicam (MOBIC) 7.5 MG tablet [id number] Take 1 tablet (7.5 mg total) by  mouth daily. Charlott Rakes, MD Taking Active   methocarbamol (ROBAXIN) 500 MG tablet [id number] Yes Take 1 tablet (500 mg total) by mouth 2 (two) times daily as needed (back pain). Charlott Rakes, MD Taking Active   metoCLOPramide (REGLAN) 10 MG tablet [id number] Yes TAKE 1 TABLET BY MOUTH EVERY EIGHT HOURS AS NEEDED FOR NAUSEA OR VOMITING. [name], PA-C Taking Active    metoprolol tartrate (LOPRESSOR) 25 MG tablet [id number]  Take 1 tablet (25 mg total) by mouth 2 (two) times daily. Charlott Rakes, MD  Expired 01/30/20 2359   Multiple Vitamin (MULTIVITAMIN) tablet [id number] Yes Take 1 tablet by mouth daily. [provider] Taking Active   nitroGLYCERIN (NITROSTAT) 0.4 MG SL tablet [id number] Yes Place 0.4 mg under the tongue every 5 (five) minutes as needed for chest pain. [provider] Taking Active   pantoprazole (PROTONIX) 40 MG tablet [id number] Yes TAKE 1 TABLET EVERY DAY Newlin, Enobong, MD Taking Active   rosuvastatin (CRESTOR) 40 MG tablet [id number] Yes Take 1 tablet (40 mg total) by mouth daily. Charlott Rakes, MD Taking Active   sitaGLIPtin-metformin (JANUMET) 50-1000 MG tablet [id number] Yes Take 1 tablet by mouth 2 (two) times daily with a meal. Charlott Rakes, MD Taking Active   traZODone (DESYREL) 50 MG tablet [id number] Yes TAKE 1 TABLET AT BEDTIME AS NEEDED FOR SLEEP Charlott Rakes, MD Taking Active   VASCEPA 1 g capsule [id number] Yes TAKE 2 CAPSULES TWICE A DAY Dorothy Spark, MD Taking Active   vitamin B-12 (CYANOCOBALAMIN) 500 MCG tablet [id number] Yes Take 500 mcg by mouth daily. [provider] Taking Active   vitamin C (ASCORBIC ACID) 500 MG tablet [id number] Yes Take 500 mg by mouth daily. [provider] Taking Active Mother  Med List Note Sarah Charles 12/22/14 1915): Patient receives medications from the [facility]           Patient Active Problem List   Diagnosis Date Noted  . CAD (coronary artery disease) 11/22/2017  . Chest pain, precordial 11/04/2017  . Acute chest pain   . Type 2 diabetes mellitus with hyperlipidemia ([place]) 10/29/2017  . Unstable angina ([place]) 10/29/2017  . Diabetic polyneuropathy associated with type 2 diabetes mellitus ([place]) 05/30/2017  . Mixed dyslipidemia 05/30/2017  . Depression 05/31/2016  . Hypertension 05/31/2016  . Uncontrolled type 2  diabetes mellitus with complication ([place]) 25/42/7062  . Uncontrolled type 2 diabetes mellitus with complication, with long-term current use of insulin ([place]) 04/20/2015  . Cellulitis of breast 12/29/2013  . Diabetes mellitus ([place]) 12/29/2013  . Cellulitis of female breast 12/29/2013  . Symptomatic menopausal or female climacteric states 04/08/2008  . OTHER SPECIFIED DISEASE OF NAIL 04/08/2008  . URINARY URGENCY 04/08/2008  . COUGH 04/02/2008  . HYPERTENSION, BENIGN ESSENTIAL 04/12/2007  . FATIGUE 04/12/2007  . OBESITY, NOS 07/21/2006  . Major depressive disorder, recurrent episode ([place]) 07/21/2006  . TOBACCO DEPENDENCE 07/21/2006  . GASTROESOPHAGEAL REFLUX, NO ESOPHAGITIS 07/21/2006  . INCONTINENCE, STRESS, FEMALE 07/21/2006    Conditions to be addressed/monitored per PCP order:  as stated above.  Care Plan : Wellness (Adult)  Updates made by Gayla Medicus, RN since 06/10/2020 12:00 AM    Problem: Health Literacy (Wellness)   Priority: High  Onset Date: 06/10/2020    Care Plan : General Plan of Care (Adult)  Updates made by Gayla Medicus, RN since 06/10/2020 12:00 AM    Problem: Health Promotion or Disease Self-Management (General Plan of Care)   Priority: High  Onset Date: 06/10/2020    Goal: Self-Management Plan Developed   Start Date: 06/10/2020  Expected End Date: 09/08/2020  This Visit's Progress: Not on track  Priority: High  Note:   Current Barriers:  . Chronic Disease Management support and education needs.  Nurse Case Manager Clinical Goal(s):  Marland Kitchen Over the next 30 days, patient will attend all scheduled medical appointments: . Over the next 30 days, patient will work with CM team pharmacist to review medications.  Interventions:  . Inter-disciplinary care team collaboration (see longitudinal plan of care) . Reviewed medications with patient. Nash Dimmer with pharmacy regarding medication review. . Discussed plans with patient for ongoing care management follow  up and provided patient with direct contact information for care management team . Reviewed scheduled/upcoming provider appointments. . Pharmacy referral for medication review.  Patient Goals/Self-Care Activities Over the next 30 days, patient will:  -Self administers medications as prescribed Attends all scheduled provider appointments Calls pharmacy for medication refills Calls provider office for new concerns or questions  Follow Up Plan: The Managed Medicaid care management team will reach out to the patient again over the next 30 days.  The patient has been provided with contact information for the Managed Medicaid care management team and has been advised to call with any health related questions or concerns.            Follow Up:  Patient agrees to Care Plan and Follow-up.  Plan: The Managed Medicaid care management team will reach out to the patient again over the next 30 days. and The patient has been provided with contact information for the Managed Medicaid care management team and has been advised to call with any health related questions or concerns.  Date/time of next scheduled RN care management/care coordination outreach: 07/11/20 at 0900.

## 2020-06-10 NOTE — Patient Instructions (Signed)
Visit Information  Sarah Charles was given information about Medicaid Managed Care team care coordination services as a part of their [facility] Plan Medicaid benefit. Sarah Charles verbally consented to engagement with the [facility] Managed Care team.   For questions related to your [facility], please call: [phone] or visit the homepage here: [email or web address]  If you would like to schedule transportation through your [facility], please call the following number at least 2 days in advance of your appointment: [phone]  Sarah Charles - following are the goals we discussed in your visit today:  Goals Addressed            This Visit's Progress   . HEMOGLOBIN A1C < 7        Patient verbalizes understanding of instructions provided today.   The Managed Medicaid care management team will reach out to the patient again over the next 30 days.  The patient has been provided with contact information for the Managed Medicaid care management team and has been advised to call with any health related questions or concerns.   Kathi Der RN, BSN [place]  [employer], production - Managed Medicaid High Risk [phone].  Following is a copy of your plan of care:  Patient Care Plan: Wellness (Adult)    Problem Identified: Health Literacy (Wellness)   Priority: High  Onset Date: 06/10/2020    Patient Care Plan: General Plan of Care (Adult)    Problem Identified: Health Promotion or Disease Self-Management (General Plan of Care)   Priority: High  Onset Date: 06/10/2020    Goal: Self-Management Plan Developed   Start Date: 06/10/2020  Expected End Date: 09/08/2020  This Visit's Progress: Not on track  Priority: High  Note:   Current Barriers:  . Chronic Disease Management support and education needs.  Nurse Case Manager Clinical Goal(s):   Marland Kitchen Over the next 30 days, patient will attend all scheduled medical appointments: . Over the next 30 days, patient will work with CM team pharmacist to review medications.  Interventions:  . Inter-disciplinary care team collaboration (see longitudinal plan of care) . Reviewed medications with patient. Steele Sizer with pharmacy regarding medication review. . Discussed plans with patient for ongoing care management follow up and provided patient with direct contact information for care management team . Reviewed scheduled/upcoming provider appointments. . Pharmacy referral for medication review.  Patient Goals/Self-Care Activities Over the next 30 days, patient will:  -Self administers medications as prescribed Attends all scheduled provider appointments Calls pharmacy for medication refills Calls provider office for new concerns or questions  Follow Up Plan: The Managed Medicaid care management team will reach out to the patient again over the next 30 days.  The patient has been provided with contact information for the Managed Medicaid care management team and has been advised to call with any health related questions or concerns.

## 2020-06-11 NOTE — Telephone Encounter (Signed)
Appeals letter faxed 1/19

## 2020-06-13 ENCOUNTER — Ambulatory Visit: Payer: Medicaid Other | Admitting: Podiatry

## 2020-06-17 ENCOUNTER — Telehealth: Payer: Self-pay | Admitting: Pharmacist

## 2020-06-17 ENCOUNTER — Other Ambulatory Visit: Payer: Self-pay | Admitting: Family Medicine

## 2020-06-17 DIAGNOSIS — E1165 Type 2 diabetes mellitus with hyperglycemia: Secondary | ICD-10-CM

## 2020-06-17 DIAGNOSIS — IMO0002 Reserved for concepts with insufficient information to code with codable children: Secondary | ICD-10-CM

## 2020-06-17 DIAGNOSIS — I10 Essential (primary) hypertension: Secondary | ICD-10-CM

## 2020-06-17 NOTE — Telephone Encounter (Signed)
Received phone call from [facility] that patient needs to sign consent form before they can accept our appeals letter.  They will fax [place] the form so that patient can sign and we can fax back.  I have called pt and left message for patient to call back

## 2020-06-22 ENCOUNTER — Other Ambulatory Visit: Payer: Self-pay | Admitting: Family Medicine

## 2020-06-22 NOTE — Telephone Encounter (Signed)
Requested medication (s) are due for refill today: yes  Requested medication (s) are on the active medication list: yes  Last refill:  04/04/20  Future visit scheduled: no  Notes to clinic:  med not delegated to NT to RF   Requested Prescriptions  Pending Prescriptions Disp Refills   methocarbamol (ROBAXIN) 500 MG tablet [Pharmacy Med Name: METHOCARBAMOL 500 MG TABLET] 30 tablet 0    Sig: Take 1 tablet (500 mg total) by mouth 2 (two) times daily as needed (back pain).      Not Delegated - Analgesics:  Muscle Relaxants Failed - 06/22/2020 12:46 PM      Failed - This refill cannot be delegated      Passed - Valid encounter within last 6 months    Recent Outpatient Visits           4 months ago Type 2 diabetes mellitus with hyperglycemia, with long-term current use of insulin (HCC)   [facility] [place], [place], [place]   4 months ago Type 2 diabetes mellitus with hyperglycemia, with long-term current use of insulin (HCC)   [facility] [place], Valetta Mole, MD   5 months ago Type 2 diabetes mellitus with hyperglycemia, with long-term current use of insulin (HCC)   [facility] [place], [place], [place]   6 months ago Type 2 diabetes mellitus with hyperglycemia, with long-term current use of insulin (HCC)   [facility] [name], MD   8 months ago Thickened nails   [facility] [place], [place], [place]

## 2020-06-23 NOTE — Telephone Encounter (Signed)
Patient called. She received the form. She will have someone bring her to drop it off at the office.

## 2020-06-23 NOTE — Telephone Encounter (Signed)
Patient dropped off signed form. Faxed to [phone]

## 2020-07-01 ENCOUNTER — Other Ambulatory Visit: Payer: Self-pay

## 2020-07-01 NOTE — Patient Outreach (Signed)
Medicaid Managed Care    Pharmacy Note  07/01/2020 Name: Sarah Charles MRN: [id number] DOB: 01/21/1961  Sarah Charles is a 59 y.o. year old female who is a primary care patient of Newlin, Enobong, MD. The Medicaid Managed Care Coordination team was consulted for assistance with disease management and care coordination needs.    Engaged with patient Engaged with patient by telephone for initial visit in response to referral for case management and/or care coordination services.  Sarah Charles was given information about Managed Medicaid Care Coordination team services today. Sarah Charles agreed to services and verbal consent obtained.   Objective:  Lab Results  Component Value Date   CREATININE 0.61 06/01/2020   CREATININE 0.78 12/10/2019   CREATININE 0.69 07/23/2019    Lab Results  Component Value Date   HGBA1C 11.1 (A) 12/10/2019       Component Value Date/Time   CHOL 242 (H) 07/23/2019 1600   TRIG 228 (H) 07/23/2019 1600   HDL 50 07/23/2019 1600   CHOLHDL 4.8 (H) 07/23/2019 1600   CHOLHDL 9.7 (H) 07/23/2016 1638   VLDL NOT CALC 07/23/2016 1638   LDLCALC 150 (H) 07/23/2019 1600    Other: (TSH, CBC, Vit D, etc.)  Clinical ASCVD: Yes  The 10-year ASCVD risk score (Goff DC Jr., et al., 2013) is: 15%   Values used to calculate the score:     Age: 59 years     Sex: Female     Is Non-Hispanic African American: No     Diabetic: Yes     Tobacco smoker: No     Systolic Blood Pressure: 163 mmHg     Is BP treated: Yes     HDL Cholesterol: 50 mg/dL     Total Cholesterol: 242 mg/dL    Other: (CHADS2VASc if Afib, PHQ9 if depression, MMRC or CAT for COPD, ACT, DEXA)  BP Readings from Last 3 Encounters:  06/01/20 (!) 163/75  02/04/20 131/84  12/31/19 (!) 170/82    Assessment/Interventions: Review of patient past medical history, allergies, medications, health status, including review of consultants reports, laboratory and other test data, was performed as part of comprehensive  evaluation and provision of chronic care management services.    Cardio BP Readings from Last 3 Encounters:  06/01/20 (!) 163/75  02/04/20 131/84  12/31/19 (!) 170/82   No Meter at home Amlodipine 5mg Lisinopril 20mg Metoprolol 25mg BID Tartrate ASA 81mg Clopidogrel 75mg Plan; Will look into options for getting patient a monitor  DM Lab Results  Component Value Date   HGBA1C 11.1 (A) 12/10/2019   HGBA1C 11.2 (H) 07/23/2019   HGBA1C 12.2 (A) 03/08/2019   Lab Results  Component Value Date   MICROALBUR 80 09/16/2016   LDLCALC 150 (H) 07/23/2019   CREATININE 0.61 06/01/2020    Lab Results  Component Value Date   NA 139 06/01/2020   Charles 4.1 06/01/2020   CREATININE 0.61 06/01/2020   GFRNONAA >60 06/01/2020   GFRAA >60 12/10/2019   GLUCOSE 214 (H) 06/01/2020   Lantus 55 units Victoza Sitagliptin/Metformin 50-1000 Plan: Sugars not at goal, Sitaglipitin and Victoza are contraindicated, will ask PCP about DC'ing and increasing insulin to compensate  Mood Depression screen PHQ 2/9 08/20/2019 04/03/2019 08/15/2017  Decreased Interest 0 0 1  Down, Depressed, Hopeless 3 0 0  PHQ - 2 Score 3 0 1  Altered sleeping 0 0 1  Tired, decreased energy 2 0 3  Change in appetite 0 0 1  Feeling bad or failure about yourself    0 0 0  Trouble concentrating 0 0 1  Moving slowly or fidgety/restless 3 0 0  Suicidal thoughts 2 0 0  PHQ-9 Score 10 0 7  Some recent data might be hidden   -Doing good, really content Bupropion 150mg BID Fluoxetine 20mg Plan: At goal,  patient stable/ symptoms controlled   Lipids Lab Results  Component Value Date   CHOL 242 (H) 07/23/2019   CHOL 184 11/09/2018   CHOL 140 06/26/2018   Lab Results  Component Value Date   HDL 50 07/23/2019   HDL 35 (L) 11/09/2018   HDL 40 06/26/2018   Lab Results  Component Value Date   LDLCALC 150 (H) 07/23/2019   LDLCALC Comment 11/09/2018   LDLCALC 59 06/26/2018   Lab Results  Component Value Date   TRIG  228 (H) 07/23/2019   TRIG 469 (H) 11/09/2018   TRIG 203 (H) 06/26/2018   Lab Results  Component Value Date   CHOLHDL 4.8 (H) 07/23/2019   CHOLHDL 5.3 (H) 11/09/2018   CHOLHDL 3.5 06/26/2018   No results found for: LDLDIRECT Fenofibrate 145mg Rosuvastatin 40mg VASCEPA 1G, 2 BID Plan: Patient not taking statin, will re-assess once compliance has improved  Pain With Meds: "Awesome!" Without Meds: 7/10 Gabapentin 300mg BID Meloxicam 7.5mg Methocarbamol 500mg Plan: Wants me to look into PT    Insomnia Trazodone -Gets 7-8 hours/night Taking 2/night. Plan: At goal,  patient stable/ symptoms controlled   Meds: -Can't afford medications, gets paid 1st of month ($40) and 3rd of month. When she gets refills at end of the month, sometimes she has to wait.  Verbal consent obtained for [facility] enhanced pharmacy services (medication synchronization, adherence packaging, delivery coordination). A medication sync plan was created to allow patient to get all medications delivered once every 30 to 90 days per patient preference. Patient understands they have freedom to choose pharmacy and clinical pharmacist will coordinate care between all prescribers and [facility].     SDOH (Social Determinants of Health) assessments and interventions performed:    Care Plan  Allergies  Allergen Reactions  . Cymbalta [Duloxetine Hcl] Nausea Only    Nausea, lack of therapeutic effect    Medications Reviewed Today    Reviewed by Sarah Charles, RPH (Pharmacist) on 07/01/20 at 1504  Med List Status: <None>  Medication Order Taking? Sig Documenting Provider Last Dose Status Informant  amLODipine (NORVASC) 5 MG tablet [id number]  Take 1 tablet (5 mg total) by mouth daily. Newlin, Enobong, MD  Expired 03/30/20 2359   aspirin EC 81 MG tablet [id number] Yes Take 81 mg by mouth daily. [provider] Taking Active   B-D UF III MINI PEN NEEDLES 31G X 5 MM MISC [id number] Yes USE  AS DIRECTED Newlin, Enobong, MD Taking Active   Blood Glucose Monitoring Suppl (ACCU-CHEK GUIDE ME) w/Device KIT [id number] Yes 1 each by Does not apply route 3 (three) times daily. Charles, Sarah M, PA-C Taking Active   buPROPion (WELLBUTRIN SR) 150 MG 12 hr tablet [id number] Yes Take 1 tablet (150 mg total) by mouth 2 (two) times daily. Newlin, Enobong, MD Taking Active   cetirizine (ZYRTEC) 10 MG tablet [id number] No TAKE 1 TABLET BY MOUTH EVERY DAY  Patient not taking: Reported on 07/01/2020   Newlin, Enobong, MD Not Taking Active   clopidogrel (PLAVIX) 75 MG tablet [id number] Yes TAKE 1 TABLET EVERY DAY Newlin, Enobong, MD Taking Active   dicyclomine (BENTYL) 10 MG capsule [id number] No Take 1 capsule by mouth   daily.  Patient not taking: Reported on 07/01/2020   [provider] Not Taking Active   fenofibrate (TRICOR) 145 MG tablet [id number] Yes Take 1 tablet (145 mg total) by mouth daily. Nelson, Katarina H, MD Taking Active   FLUoxetine (PROZAC) 20 MG capsule [id number] Yes TAKE 1 CAPSULE BY MOUTH EVERY DAY Newlin, Enobong, MD Taking Active   gabapentin (NEURONTIN) 300 MG capsule [id number] Yes Take 1 capsule (300 mg total) by mouth 2 (two) times daily. Newlin, Enobong, MD Taking Active   glucose blood (ACCU-CHEK GUIDE) test strip [id number] Yes USE AS DIRECTED Charles, Sarah M, PA-C Taking Active   insulin glargine (LANTUS SOLOSTAR) 100 UNIT/ML Solostar Pen [id number] Yes Inject 55 Units into the skin at bedtime. Newlin, Enobong, MD Taking Active   Lancets Misc. (ACCU-CHEK FASTCLIX LANCET) KIT [id number] Yes As directed Charles, Sarah M, PA-C Taking Active   liraglutide (VICTOZA) 18 MG/3ML SOPN [id number] Yes Start 0.6mg SQ once a day for 7 days, then increase to 1.2mg once a day Newlin, Enobong, MD Taking Active   lisinopril (ZESTRIL) 20 MG tablet [id number] Yes TAKE 1 TABLET BY MOUTH EVERY DAY Newlin, Enobong, MD Taking Active   meloxicam (MOBIC) 7.5 MG tablet [id number] No Take 1 tablet (7.5  mg total) by mouth daily.  Patient not taking: Reported on 07/01/2020   Newlin, Enobong, MD Not Taking Active   methocarbamol (ROBAXIN) 500 MG tablet [id number] No Take 1 tablet (500 mg total) by mouth 2 (two) times daily as needed (back pain).  Patient not taking: Reported on 07/01/2020   Newlin, Enobong, MD Not Taking Active   metoCLOPramide (REGLAN) 10 MG tablet [id number] No TAKE 1 TABLET BY MOUTH EVERY EIGHT HOURS AS NEEDED FOR NAUSEA OR VOMITING.  Patient not taking: Reported on 07/01/2020   Charles, Sarah M, PA-C Not Taking Consider Medication Status and Discontinue   metoprolol tartrate (LOPRESSOR) 25 MG tablet [id number] Yes TAKE 1 TABLET BY MOUTH TWICE A DAY Newlin, Enobong, MD Taking Active   Multiple Vitamin (MULTIVITAMIN) tablet [id number] No Take 1 tablet by mouth daily.  Patient not taking: Reported on 07/01/2020   [provider] Not Taking Active   nitroGLYCERIN (NITROSTAT) 0.4 MG SL tablet [id number] Yes Place 0.4 mg under the tongue every 5 (five) minutes as needed for chest pain. [provider] Taking Active   pantoprazole (PROTONIX) 40 MG tablet [id number] Yes TAKE 1 TABLET EVERY DAY Newlin, Enobong, MD Taking Active   rosuvastatin (CRESTOR) 40 MG tablet [id number] Yes Take 1 tablet (40 mg total) by mouth daily. Newlin, Enobong, MD Taking Active   sitaGLIPtin-metformin (JANUMET) 50-1000 MG tablet [id number] Yes Take 1 tablet by mouth 2 (two) times daily with a meal. Newlin, Enobong, MD Taking Active   traZODone (DESYREL) 50 MG tablet [id number] Yes TAKE 1 TABLET AT BEDTIME AS NEEDED FOR SLEEP Newlin, Enobong, MD Taking Active   VASCEPA 1 g capsule [id number] No TAKE 2 CAPSULES TWICE A DAY  Patient not taking: Reported on 07/01/2020   Nelson, Katarina H, MD Not Taking Active   vitamin B-12 (CYANOCOBALAMIN) 500 MCG tablet [id number] No Take 500 mcg by mouth daily.  Patient not taking: Reported on 07/01/2020   [provider] Not Taking Active   vitamin C (ASCORBIC  ACID) 500 MG tablet [id number] No Take 500 mg by mouth daily.  Patient not taking: Reported on 07/01/2020   [provider] Not Taking Active Mother  Med List Note (Bridges, Pamela C, CPhT 12/22/14 1915): Patient receives medications from the   [facility] [place]          Patient Active Problem List   Diagnosis Date Noted  . CAD (coronary artery disease) 11/22/2017  . Chest pain, precordial 11/04/2017  . Acute chest pain   . Type 2 diabetes mellitus with hyperlipidemia ([place]) 10/29/2017  . Unstable angina ([place]) 10/29/2017  . Diabetic polyneuropathy associated with type 2 diabetes mellitus ([place]) 05/30/2017  . Mixed dyslipidemia 05/30/2017  . Depression 05/31/2016  . Hypertension 05/31/2016  . Uncontrolled type 2 diabetes mellitus with complication ([place]) 39/76/7341  . Uncontrolled type 2 diabetes mellitus with complication, with long-term current use of insulin ([place]) 04/20/2015  . Cellulitis of breast 12/29/2013  . Diabetes mellitus ([place]) 12/29/2013  . Cellulitis of female breast 12/29/2013  . Symptomatic menopausal or female climacteric states 04/08/2008  . OTHER SPECIFIED DISEASE OF NAIL 04/08/2008  . URINARY URGENCY 04/08/2008  . COUGH 04/02/2008  . HYPERTENSION, BENIGN ESSENTIAL 04/12/2007  . FATIGUE 04/12/2007  . OBESITY, NOS 07/21/2006  . Major depressive disorder, recurrent episode ([place]) 07/21/2006  . TOBACCO DEPENDENCE 07/21/2006  . GASTROESOPHAGEAL REFLUX, NO ESOPHAGITIS 07/21/2006  . INCONTINENCE, STRESS, FEMALE 07/21/2006    Conditions to be addressed/monitored: HTN, Hypertriglyceridemia, DM and Depression  Care Plan : Medication Management  Updates made by Lane Hacker, [place] since 07/01/2020 12:00 AM    Problem: Health Promotion or Disease Self-Management (General Plan of Care)     Goal: Medication Management   Note:   Current Barriers:  . Unable to independently afford treatment regimen .   Pharmacist Clinical Goal(s):  Marland Kitchen Over the next 30  days, patient will verbalize ability to afford treatment regimen through collaboration with PharmD and provider.  .   Interventions: . Inter-disciplinary care team collaboration (see longitudinal plan of care) . Comprehensive medication review performed; medication list updated in electronic medical record  _0 @ _1 @ _2 @  Patient Goals/Self-Care Activities . Over the next 30 days, patient will:  - take medications as prescribed collaborate with provider on medication access solutions  Follow Up Plan: The care management team will reach out to the patient again over the next 30 days.     Task: Mutually Develop and Royce Macadamia Achievement of Patient Goals   Note:   Care Management Activities:    - verbalization of feelings encouraged    Notes:      Medication Assistance: Utilizing [facility] (Organizing delivery on 3rd of the month)  Follow up: Agree/Does not agree  Plan: The care management team will reach out to the patient again over the next 30 days.   [place] Constable, Pharm.D., Managed Medicaid Pharmacist - [phone]

## 2020-07-01 NOTE — Patient Instructions (Signed)
Visit Information  Ms. Dhaliwal was given information about Medicaid Managed Care team care coordination services as a part of their [facility] Medicaid benefit. [name] verbally consented to engagement with the [facility] Managed Care team.   For questions related to your [facility], please call: [phone] or visit the homepage here: [email or web address]  If you would like to schedule transportation through your [facility], please call the following number at least 2 days in advance of your appointment: [phone]  Ms. Lambert Mody - following are the goals we discussed in your visit today:  Goals Addressed            This Visit's Progress   . Manage My Medicine       Timeframe:  Long-Range Goal Priority:  High Start Date:                             Expected End Date:                       Follow Up Date Monthly   - call for medicine refill 2 or 3 days before it runs out    Why is this important?   . These steps will help you keep on track with your medicines.   Notes: Patient paid on 1st and 3rd of month. Can't afford meds if refilled later in the month. Verbalized consent for [facility] to get all meds delivered on 3rd of month  Verbal consent obtained for [facility] enhanced pharmacy services (medication synchronization, adherence packaging, delivery coordination). A medication sync plan was created to allow patient to get all medications delivered once every 30 to 90 days per patient preference. Patient understands they have freedom to choose pharmacy and clinical pharmacist will coordinate care between all prescribers and [facility].        Please see education materials related to DM provided as print materials.   Patient verbalizes understanding of instructions provided today.   The Managed Medicaid care management team will reach out to  the patient again over the next 30 days.   Zettie Pho, [facility]  Following is a copy of your plan of care:  Patient Care Plan: Wellness (Adult)    Problem Identified: Health Literacy (Wellness)   Priority: High  Onset Date: 06/10/2020    Patient Care Plan: General Plan of Care (Adult)    Problem Identified: Health Promotion or Disease Self-Management (General Plan of Care)   Priority: High  Onset Date: 06/10/2020    Goal: Self-Management Plan Developed   Start Date: 06/10/2020  Expected End Date: 09/08/2020  This Visit's Progress: Not on track  Priority: High  Note:   Current Barriers:  . Chronic Disease Management support and education needs.  Nurse Case Manager Clinical Goal(s):  Marland Kitchen Over the next 30 days, patient will attend all scheduled medical appointments: . Over the next 30 days, patient will work with CM team pharmacist to review medications.  Interventions:  . Inter-disciplinary care team collaboration (see longitudinal plan of care) . Reviewed medications with patient. Steele Sizer with pharmacy regarding medication review. . Discussed plans with patient for ongoing care management follow up and provided patient with direct contact information for care management team . Reviewed scheduled/upcoming provider appointments. . Pharmacy referral for medication review.  Patient Goals/Self-Care Activities Over the next 30 days, patient will:  -Self administers medications as prescribed  Attends all scheduled provider appointments Calls pharmacy for medication refills Calls provider office for new concerns or questions  Follow Up Plan: The Managed Medicaid care management team will reach out to the patient again over the next 30 days.  The patient has been provided with contact information for the Managed Medicaid care management team and has been advised to call with any health related questions or concerns.       Task: Mutually Develop and Malen Gauze Achievement of  Patient Goals   Note:   Care Management Activities:    - verbalization of feelings encouraged    Notes:    Patient Care Plan: Medication Management    Problem Identified: Health Promotion or Disease Self-Management (General Plan of Care)     Goal: Medication Management   Note:   Current Barriers:  . Unable to independently afford treatment regimen .   Pharmacist Clinical Goal(s):  Marland Kitchen Over the next 30 days, patient will verbalize ability to afford treatment regimen through collaboration with PharmD and provider.  .   Interventions: . Inter-disciplinary care team collaboration (see longitudinal plan of care) . Comprehensive medication review performed; medication list updated in electronic medical record  @RXCPDIABETES @ @RXCPHYPERTENSION @ @RXCPHYPERLIPIDEMIA @  Patient Goals/Self-Care Activities . Over the next 30 days, patient will:  - take medications as prescribed collaborate with provider on medication access solutions  Follow Up Plan: The care management team will reach out to the patient again over the next 30 days.     Task: Mutually Develop and Achievement of Patient Goals   Note:   Care Management Activities:    - verbalization of feelings encouraged    Notes:

## 2020-07-04 ENCOUNTER — Other Ambulatory Visit: Payer: Self-pay

## 2020-07-04 ENCOUNTER — Ambulatory Visit (INDEPENDENT_AMBULATORY_CARE_PROVIDER_SITE_OTHER): Payer: Medicaid Other | Admitting: Podiatry

## 2020-07-04 DIAGNOSIS — L089 Local infection of the skin and subcutaneous tissue, unspecified: Secondary | ICD-10-CM

## 2020-07-04 DIAGNOSIS — S90829A Blister (nonthermal), unspecified foot, initial encounter: Secondary | ICD-10-CM

## 2020-07-04 MED ORDER — DOXYCYCLINE HYCLATE 100 MG PO TABS: 100.0000 mg | ORAL_TABLET | Freq: Two times a day (BID) | ORAL | 0 refills | Status: DC

## 2020-07-07 ENCOUNTER — Other Ambulatory Visit: Payer: Self-pay

## 2020-07-07 NOTE — Patient Outreach (Signed)
Coordinated with patient, Pharmacy, and PCP to coordinate fill dates for patient. Uploaded to [facility] and will f/u next month for refills

## 2020-07-08 ENCOUNTER — Encounter: Payer: Self-pay | Admitting: Podiatry

## 2020-07-08 NOTE — Progress Notes (Signed)
Subjective:  Patient ID: Sarah Charles, female    DOB: [birth year],  MRN: [id number]  Chief Complaint  Patient presents with  . blisters    Bilateral blisters on the bottom of the foot Pt stated that it itches and it is painful when she walks.    60 y.o. female presents with the above complaint.  Patient presents with complaint of bilateral plantar midfoot blisters are superficial in nature.  Patient states that they have been going on for quite some time as itching is painful to walk on.  She wants to get it evaluated.  She states that this happened over the last few weeks has progressive gotten worse.  She has not tried anything for it.  She tried applying some antiaging cream.  She has not taken any antibiotics.  She denies any other acute complaints   Review of Systems: Negative except as noted in the HPI. Denies N/V/F/Ch.  Past Medical History:  Diagnosis Date  . Arthritis   . Coronary artery calcification seen on CT scan    a. 07/2017 noted on CT abd.  . Depression   . GERD (gastroesophageal reflux disease)   . Hyperlipidemia   . Hypertension   . Obesity   . Sleep apnea    a. Does not use CPAP "cause i dont think I need it anymore".  . Tobacco abuse   . Type II diabetes mellitus (HCC)     Current Outpatient Medications:  .  doxycycline (VIBRA-TABS) 100 MG tablet, Take 1 tablet (100 mg total) by mouth 2 (two) times daily., Disp: 20 tablet, Rfl: 0 .  amLODipine (NORVASC) 5 MG tablet, Take 1 tablet (5 mg total) by mouth daily., Disp: 180 tablet, Rfl: 1 .  aspirin EC 81 MG tablet, Take 81 mg by mouth daily., Disp: , Rfl:  .  B-D UF III MINI PEN NEEDLES 31G X 5 MM MISC, USE AS DIRECTED, Disp: 100 each, Rfl: 3 .  Blood Glucose Monitoring Suppl (ACCU-CHEK GUIDE ME) w/Device KIT, 1 each by Does not apply route 3 (three) times daily., Disp: 1 kit, Rfl: 0 .  buPROPion (WELLBUTRIN SR) 150 MG 12 hr tablet, Take 1 tablet (150 mg total) by mouth 2 (two) times daily., Disp: 60 tablet, Rfl:  2 .  cetirizine (ZYRTEC) 10 MG tablet, TAKE 1 TABLET BY MOUTH EVERY DAY (Patient not taking: Reported on 07/01/2020), Disp: 30 tablet, Rfl: 2 .  clopidogrel (PLAVIX) 75 MG tablet, TAKE 1 TABLET EVERY DAY, Disp: 90 tablet, Rfl: 1 .  dicyclomine (BENTYL) 10 MG capsule, Take 1 capsule by mouth daily. (Patient not taking: Reported on 07/01/2020), Disp: , Rfl:  .  fenofibrate (TRICOR) 145 MG tablet, Take 1 tablet (145 mg total) by mouth daily., Disp: 90 tablet, Rfl: 3 .  FLUoxetine (PROZAC) 20 MG capsule, TAKE 1 CAPSULE BY MOUTH EVERY DAY, Disp: 90 capsule, Rfl: 0 .  gabapentin (NEURONTIN) 300 MG capsule, Take 1 capsule (300 mg total) by mouth 2 (two) times daily., Disp: 60 capsule, Rfl: 2 .  glucose blood (ACCU-CHEK GUIDE) test strip, USE AS DIRECTED, Disp: 100 strip, Rfl: 2 .  insulin glargine (LANTUS SOLOSTAR) 100 UNIT/ML Solostar Pen, Inject 55 Units into the skin at bedtime., Disp: 30 mL, Rfl: 3 .  Lancets Misc. (ACCU-CHEK FASTCLIX LANCET) KIT, As directed, Disp: 1 kit, Rfl: 0 .  liraglutide (VICTOZA) 18 MG/3ML SOPN, Start 0.68m SQ once a day for 7 days, then increase to 1.228monce a day, Disp: 6 mL, Rfl: 3 .  lisinopril (ZESTRIL) 20 MG tablet, TAKE 1 TABLET BY MOUTH EVERY DAY, Disp: 90 tablet, Rfl: 1 .  meloxicam (MOBIC) 7.5 MG tablet, Take 1 tablet (7.5 mg total) by mouth daily. (Patient not taking: Reported on 07/01/2020), Disp: 10 tablet, Rfl: 0 .  methocarbamol (ROBAXIN) 500 MG tablet, Take 1 tablet (500 mg total) by mouth 2 (two) times daily as needed (back pain). (Patient not taking: Reported on 07/01/2020), Disp: 30 tablet, Rfl: 0 .  metoCLOPramide (REGLAN) 10 MG tablet, TAKE 1 TABLET BY MOUTH EVERY EIGHT HOURS AS NEEDED FOR NAUSEA OR VOMITING. (Patient not taking: Reported on 07/01/2020), Disp: 40 tablet, Rfl: 0 .  metoprolol tartrate (LOPRESSOR) 25 MG tablet, TAKE 1 TABLET BY MOUTH TWICE A DAY, Disp: 180 tablet, Rfl: 0 .  Multiple Vitamin (MULTIVITAMIN) tablet, Take 1 tablet by mouth daily. (Patient  not taking: Reported on 07/01/2020), Disp: , Rfl:  .  nitroGLYCERIN (NITROSTAT) 0.4 MG SL tablet, Place 0.4 mg under the tongue every 5 (five) minutes as needed for chest pain., Disp: , Rfl:  .  pantoprazole (PROTONIX) 40 MG tablet, TAKE 1 TABLET EVERY DAY, Disp: 90 tablet, Rfl: 2 .  rosuvastatin (CRESTOR) 40 MG tablet, Take 1 tablet (40 mg total) by mouth daily., Disp: 90 tablet, Rfl: 1 .  sitaGLIPtin-metformin (JANUMET) 50-1000 MG tablet, Take 1 tablet by mouth 2 (two) times daily with a meal., Disp: 180 tablet, Rfl: 1 .  traZODone (DESYREL) 50 MG tablet, TAKE 1 TABLET AT BEDTIME AS NEEDED FOR SLEEP, Disp: 30 tablet, Rfl: 2 .  VASCEPA 1 g capsule, TAKE 2 CAPSULES TWICE A DAY (Patient not taking: Reported on 07/01/2020), Disp: 360 capsule, Rfl: 3 .  vitamin B-12 (CYANOCOBALAMIN) 500 MCG tablet, Take 500 mcg by mouth daily. (Patient not taking: Reported on 07/01/2020), Disp: , Rfl:  .  vitamin C (ASCORBIC ACID) 500 MG tablet, Take 500 mg by mouth daily. (Patient not taking: Reported on 07/01/2020), Disp: , Rfl:   Social History   Tobacco Use  Smoking Status Former Smoker  . Packs/day: 1.00  . Years: 41.00  . Pack years: 41.00  . Types: Cigarettes  . Quit date: 10/07/2018  . Years since quitting: 1.7  Smokeless Tobacco Never Used    Allergies  Allergen Reactions  . Cymbalta [Duloxetine Hcl] Nausea Only    Nausea, lack of therapeutic effect   Objective:  There were no vitals filed for this visit. There is no height or weight on file to calculate BMI. Constitutional Well developed. Well nourished.  Vascular Dorsalis pedis pulses palpable bilaterally. Posterior tibial pulses palpable bilaterally. Capillary refill normal to all digits.  No cyanosis or clubbing noted. Pedal hair growth normal.  Neurologic Normal speech. Oriented to person, place, and time. Epicritic sensation to light touch grossly present bilaterally.  Dermatologic  bilateral blister formation noted with surrounding  erythema.  No concern for infection noted.  No drainage present.  No purulent drainage noted.  No wound present.  Orthopedic: Normal joint ROM without pain or crepitus bilaterally. No visible deformities. No bony tenderness.   Radiographs: None Assessment:   1. Blister of foot with infection, unspecified laterality, initial encounter    Plan:  Patient was evaluated and treated and all questions answered.  Bilateral plantar midfoot blister -I explained to the patient the etiology of blisters and various treatment options were discussed.  She has not had any recent history of shoe gear modification or anything that could add contribute to the blister formation.  However for now  we will treated with Betadine wet-to-dry dressing changes I instructed her to do it at least once a day or every other day.  She states understanding and will do Betadine wet-to-dry dressing changes. -I have discussed with her that if continues to get worse go to the emergency room right away or come see me right away.  She states understanding -Given that there is erythema present I believe she will benefit from doxycycline for skin and soft tissue prophylaxis.  I have sent the medication to her pharmacy and have instructed her start taking.  She states understanding.  No follow-ups on file.

## 2020-07-11 ENCOUNTER — Other Ambulatory Visit: Payer: Self-pay

## 2020-07-11 ENCOUNTER — Other Ambulatory Visit: Payer: Self-pay | Admitting: Obstetrics and Gynecology

## 2020-07-11 NOTE — Patient Instructions (Signed)
Visit Information:  Ms. Blowe was given information about Medicaid Managed Care team care coordination services as a part of their [place] Medicaid benefit. [name] verbally consented to engagement with the [facility] Managed Care team.   For questions related to your [facility], please call: [phone] or visit the homepage here: [email or web address]  If you would like to schedule transportation through your [facility], please call the following number at least 2 days in advance of your appointment: [phone]  Ms. Hervey Ard - following are the goals we discussed in your visit today:  Goals Addressed            This Visit's Progress   . Protect My Health       Timeframe:  Long-Range Goal Priority:  High Start Date:      06/10/20                       Expected End Date:    10/08/20                   Follow Up Date 08/08/20   - schedule appointment for vaccines needed due to my age or health - schedule recommended health tests (blood work, mammogram, colonoscopy, pap test) - schedule and keep appointment for annual check-up    Why is this important?    Screening tests can find diseases early when they are easier to treat.   Your doctor or nurse will talk with you about which tests are important for you.   Getting shots for common diseases like the flu and shingles will help prevent them.         Patient verbalizes understanding of instructions provided today.   The Managed Medicaid care management team will reach out to the patient again over the next 30 days.  The patient has been provided with contact information for the Managed Medicaid care management team and has been advised to call with any health related questions or concerns.   Aida Raider RN, BSN [place]  [facility] Curator - Managed Medicaid High  Risk [phone].  Following is a copy of your plan of care:  Patient Care Plan: Wellness (Adult)    Problem Identified: Health Literacy (Wellness)   Priority: High  Onset Date: 06/10/2020    Patient Care Plan: General Plan of Care (Adult)    Problem Identified: Health Promotion or Disease Self-Management (General Plan of Care)   Priority: High  Onset Date: 06/10/2020    Long-Range Goal: Self-Management Plan Developed   Start Date: 06/10/2020  Expected End Date: 09/08/2020  Recent Progress: Not on track  Priority: High  Note:   Current Barriers:  . Chronic Disease Management support and education needs. . Patient needs blood pressure monitoring kit and CPAP.  Nurse Case Manager Clinical Goal(s):  Marland Kitchen Over the next 30 days, patient will attend all scheduled medical appointments: . Over the next 30 days, patient will work with CM team pharmacist to review medications. Marland Kitchen Update 07/11/20: Patient met with Pharmacist 07/01/20.  Interventions:  . Inter-disciplinary care team collaboration (see longitudinal plan of care) . Reviewed medications with patient. Nash Dimmer with pharmacy regarding medication review. . Discussed plans with patient for ongoing care management follow up and provided patient with direct contact information for care management team . Reviewed scheduled/upcoming provider appointments. . Pharmacy referral for medication review. Marland Kitchen RNCM will follow up with PCP regarding  blood pressure monitoring kit and CPAP.  Patient Goals/Self-Care Activities Over the next 30 days, patient will:  -Self administers medications as prescribed Attends all scheduled provider appointments Calls pharmacy for medication refills Calls provider office for new concerns or questions  Follow Up Plan: The Managed Medicaid care management team will reach out to the patient again over the next 30 days.  The patient has been provided with contact information for the Managed Medicaid care  management team and has been advised to call with any health related questions or concerns.

## 2020-07-11 NOTE — Patient Outreach (Signed)
Medicaid Managed Care   Nurse Care Manager Note  07/11/2020 Name:  Sarah Charles MRN:  [id number] DOB:  08/16/1960  Sarah Charles is an 60 y.o. year old female who is a primary patient of Charlott Rakes, MD.  The [facility] Managed Care Coordination team was consulted for assistance with:    chronic healthcare management needs  Sarah Charles was given information about Medicaid Managed Care Coordination team services today. Sarah Charles agreed to services and verbal consent obtained.  Engaged with patient by telephone for follow up visit in response to provider referral for case management and/or care coordination services.   Assessments/Interventions:  Review of past medical history, allergies, medications, health status, including review of consultants reports, laboratory and other test data, was performed as part of comprehensive evaluation and provision of chronic care management services.  SDOH (Social Determinants of Health) assessments and interventions performed:   Care Plan  Allergies  Allergen Reactions  . Cymbalta [Duloxetine Hcl] Nausea Only    Nausea, lack of therapeutic effect    Medications Reviewed Today    Reviewed by Gayla Medicus, RN (Registered Nurse) on 07/11/20 at [phone]  Med List Status: <None>  Medication Order Taking? Sig Documenting Provider Last Dose Status Informant  amLODipine (NORVASC) 5 MG tablet [id number] No Take 1 tablet (5 mg total) by mouth daily. Charlott Rakes, MD Taking Expired 03/30/20 2359   aspirin EC 81 MG tablet [id number] No Take 81 mg by mouth daily. [provider] Taking Active   B-D UF III MINI PEN NEEDLES 31G X 5 MM MISC [id number] No USE AS DIRECTED Charlott Rakes, MD Taking Active   Blood Glucose Monitoring Suppl (ACCU-CHEK GUIDE ME) w/Device KIT [id number] No 1 each by Does not apply route 3 (three) times daily. Freeman Caldron M, PA-C Taking Active   buPROPion [facility] SR) 150 MG 12 hr tablet [id number] No Take 1 tablet (150 mg total)  by mouth 2 (two) times daily. Charlott Rakes, MD Taking Active   cetirizine (ZYRTEC) 10 MG tablet [id number] No TAKE 1 TABLET BY MOUTH EVERY DAY  Patient not taking: Reported on 07/01/2020   Charlott Rakes, MD Not Taking Active   clopidogrel (PLAVIX) 75 MG tablet [id number] No TAKE 1 TABLET EVERY DAY Newlin, Enobong, MD Taking Active   dicyclomine (BENTYL) 10 MG capsule [id number] No Take 1 capsule by mouth daily.  Patient not taking: Reported on 07/01/2020   [provider] Not Taking Active   doxycycline (VIBRA-TABS) 100 MG tablet [id number]  Take 1 tablet (100 mg total) by mouth 2 (two) times daily. Felipa Furnace, DPM  Active   fenofibrate (TRICOR) 145 MG tablet [id number] No Take 1 tablet (145 mg total) by mouth daily. Dorothy Spark, MD Taking Active   FLUoxetine (PROZAC) 20 MG capsule [id number] No TAKE 1 CAPSULE BY MOUTH EVERY DAY Charlott Rakes, MD Taking Active   gabapentin (NEURONTIN) 300 MG capsule [id number] No Take 1 capsule (300 mg total) by mouth 2 (two) times daily. Charlott Rakes, MD Taking Active   glucose blood (ACCU-CHEK GUIDE) test strip [id number] No USE AS DIRECTED [name], PA-C Taking Active   insulin glargine (LANTUS SOLOSTAR) 100 UNIT/ML Solostar Pen [id number] No Inject 55 Units into the skin at bedtime. Charlott Rakes, MD Taking Active   Lancets Misc. (ACCU-CHEK FASTCLIX LANCET) KIT [id number] No As directed [name], PA-C Taking Active   liraglutide (VICTOZA) 18 MG/3ML SOPN [id number] No Start 0.4m SQ once a day for 7 days, then increase to  1.45m once a day NCharlott Rakes MD Taking Active   lisinopril (ZESTRIL) 20 MG tablet [id number] TAKE 1 TABLET BY MOUTH EVERY DAY Newlin, Enobong, MD Taking Active   meloxicam (MOBIC) 7.5 MG tablet [id number] Take 1 tablet (7.5 mg total) by mouth daily.  Patient not taking: Reported on 07/01/2020   NCharlott Rakes MD Not Taking Active   methocarbamol (ROBAXIN) 500 MG tablet [id number] Take 1 tablet  (500 mg total) by mouth 2 (two) times daily as needed (back pain).  Patient not taking: Reported on 07/01/2020   NCharlott Rakes MD Not Taking Active   metoCLOPramide (REGLAN) 10 MG tablet [id number] TAKE 1 TABLET BY MOUTH EVERY EIGHT HOURS AS NEEDED FOR NAUSEA OR VOMITING.  Patient not taking: Reported on 07/01/2020   MArgentina Donovan PA-C Not Taking Active   metoprolol tartrate (LOPRESSOR) 25 MG tablet [id number] TAKE 1 TABLET BY MOUTH TWICE A DAY Newlin, Enobong, MD Taking Active   Multiple Vitamin (MULTIVITAMIN) tablet [id number] Take 1 tablet by mouth daily.  Patient not taking: Reported on 07/01/2020   [provider] Not Taking Active   nitroGLYCERIN (NITROSTAT) 0.4 MG SL tablet [id number] Place 0.4 mg under the tongue every 5 (five) minutes as needed for chest pain. [provider] Taking Active   pantoprazole (PROTONIX) 40 MG tablet [id number] TAKE 1 TABLET EVERY DAY Newlin, Enobong, MD Taking Active   rosuvastatin (CRESTOR) 40 MG tablet [id number] Take 1 tablet (40 mg total) by mouth daily. NCharlott Rakes MD Taking Active   sitaGLIPtin-metformin (JANUMET) 50-1000 MG tablet [id number] Take 1 tablet by mouth 2 (two) times daily with a meal. NCharlott Rakes MD Taking Active   traZODone (DESYREL) 50 MG tablet [id number] TAKE 1 TABLET AT BEDTIME AS NEEDED FOR SLEEP NCharlott Rakes MD Taking Active   VASCEPA 1 g capsule [id number] TAKE 2 CAPSULES TWICE A DAY  Patient not taking: Reported on 07/01/2020   NDorothy Spark MD Not Taking Active   vitamin B-12 (CYANOCOBALAMIN) 500 MCG tablet [id number] Take 500 mcg by mouth daily.  Patient not taking: Reported on 07/01/2020   [provider] Not Taking Active   vitamin C (ASCORBIC ACID) 500 MG tablet [id number] Take 500 mg by mouth daily.  Patient not taking: Reported on 07/01/2020   [provider] Not Taking Active Mother  Med List Note (Sarah Rough07/31/16 15701: [facility]          Patient Active Problem List   Diagnosis Date Noted  . CAD (coronary artery disease) 11/22/2017  . Chest pain, precordial 11/04/2017  . Acute chest pain   . Type 2 diabetes mellitus with hyperlipidemia ([place] 10/29/2017  . Unstable angina ([place] 10/29/2017  . Diabetic polyneuropathy associated with type 2 diabetes mellitus ([facility] 05/30/2017  . Mixed dyslipidemia 05/30/2017  . Depression 05/31/2016  . Hypertension 05/31/2016  . Uncontrolled type 2 diabetes mellitus with complication ([place] 177/93/9030 . Uncontrolled type 2 diabetes mellitus with complication, with long-term current use of insulin ([place] 04/20/2015  . Cellulitis of breast 12/29/2013  . Diabetes mellitus ([facility] 12/29/2013  . Cellulitis of female breast 12/29/2013  . Symptomatic menopausal or female climacteric states 04/08/2008  . OTHER SPECIFIED DISEASE OF NAIL 04/08/2008  . URINARY URGENCY 04/08/2008  . COUGH 04/02/2008  . HYPERTENSION, BENIGN ESSENTIAL 04/12/2007  . FATIGUE 04/12/2007  . OBESITY, NOS 07/21/2006  . Major depressive  disorder, recurrent episode ([place]) 07/21/2006  . TOBACCO DEPENDENCE 07/21/2006  . GASTROESOPHAGEAL REFLUX, NO ESOPHAGITIS 07/21/2006  . INCONTINENCE, STRESS, FEMALE 07/21/2006    Conditions to be addressed/monitored per PCP order:  chronic healthcare management needs, DM2, HTN, GERD, hyperlipidemia, CAD  Care Plan : General Plan of Care (Adult)  Updates made by Gayla Medicus, RN since 07/11/2020 12:00 AM    Problem: Health Promotion or Disease Self-Management (General Plan of Care)   Priority: High  Onset Date: 06/10/2020    Long-Range Goal: Self-Management Plan Developed   Start Date: 06/10/2020  Expected End Date: 09/08/2020  Recent Progress: Not on track  Priority: High  Note:   Current Barriers:  . Chronic Disease Management support and education needs. . Patient needs blood pressure monitoring kit and  CPAP.  Nurse Case Manager Clinical Goal(s):  Marland Kitchen Over the next 30 days, patient will attend all scheduled medical appointments: . Over the next 30 days, patient will work with CM team pharmacist to review medications. Marland Kitchen Update 07/11/20: Patient met with Pharmacist 07/01/20.  Interventions:  . Inter-disciplinary care team collaboration (see longitudinal plan of care) . Reviewed medications with patient. Nash Dimmer with pharmacy regarding medication review. . Discussed plans with patient for ongoing care management follow up and provided patient with direct contact information for care management team . Reviewed scheduled/upcoming provider appointments. . Pharmacy referral for medication review. Marland Kitchen RNCM will follow up with PCP regarding blood pressure monitoring kit and CPAP.  Patient Goals/Self-Care Activities Over the next 30 days, patient will:  -Self administers medications as prescribed Attends all scheduled provider appointments Calls pharmacy for medication refills Calls provider office for new concerns or questions  Follow Up Plan: The Managed Medicaid care management team will reach out to the patient again over the next 30 days.  The patient has been provided with contact information for the Managed Medicaid care management team and has been advised to call with any health related questions or concerns.       Follow Up:  Patient agrees to Care Plan and Follow-up.  Plan: The Managed Medicaid care management team will reach out to the patient again over the next 30 days. and The patient has been provided with contact information for the Managed Medicaid care management team and has been advised to call with any health related questions or concerns.  Date/time of next scheduled RN care management/care coordination outreach:  08/08/20 at 0900.

## 2020-07-14 ENCOUNTER — Other Ambulatory Visit: Payer: Self-pay | Admitting: Obstetrics and Gynecology

## 2020-07-14 NOTE — Patient Outreach (Signed)
Care Coordination  07/14/2020  Odeth Bry May 16, 1961 [id number]    Medicaid Managed Care   Unsuccessful Outreach Note  07/14/2020 Name: Verdine Grenfell MRN: [id number] DOB: 1961/03/25  Referred by: Charlott Rakes, MD Reason for referral : High Risk Managed Medicaid (Unsuccessful telephone outreach)   An unsuccessful telephone outreach was attempted today. The patient was referred to the case management team for assistance with care management and care coordination.   Follow Up Plan: The patient has been provided with contact information for the care management team and has been advised to call with any health related questions or concerns.  The care management team will reach out to the patient again over the next 21 days days.  Follow up with provider re: blood pressure monitoring kit and CPAP.  RNCM left message for patient to follow up with Dr. Margarita Rana regarding blood pressure monitoring kit and CPAP.  Dr. Margarita Rana requested patient schedule an appointment for office visit as she is overdue for an appointment.  Aida Raider RN, BSN [place]  [facility] Curator - Managed Medicaid High Risk [phone]  SIGNATURE

## 2020-07-14 NOTE — Patient Instructions (Signed)
Hi Ms. Rooke, sorry we missed you today - as a part of your Medicaid benefit, you are eligible for care management and care coordination services at no cost or copay. I was unable to reach you by phone today but would be happy to help you with your health related needs. Please feel free to call me at [phone].  A member of the Managed Medicaid care management team will reach out to you again over the next 7 days.   Kathi Der RN, BSN [place]  [facility] Engineer, production - Managed Medicaid High Risk [phone].

## 2020-07-18 ENCOUNTER — Other Ambulatory Visit: Payer: Self-pay

## 2020-07-18 ENCOUNTER — Telehealth: Payer: Self-pay | Admitting: Pharmacist

## 2020-07-18 MED ORDER — ICOSAPENT ETHYL 1 G PO CAPS: 2.0000 g | ORAL_CAPSULE | Freq: Two times a day (BID) | ORAL | 3 refills | Status: DC

## 2020-07-18 NOTE — Telephone Encounter (Signed)
I called pt insurance to follow up on appeals status of Vascepa. Appeals overturned and is good through 07/07/2021. Pt made aware.

## 2020-07-18 NOTE — Patient Outreach (Signed)
Patient compliant on meds, Will get Gabapentin and Trazodone delivered next week.  Sugars are looking great, 120, 124, 162 recently

## 2020-07-21 ENCOUNTER — Other Ambulatory Visit: Payer: Self-pay | Admitting: Family Medicine

## 2020-07-21 DIAGNOSIS — F32A Depression, unspecified: Secondary | ICD-10-CM

## 2020-07-21 MED ORDER — BUPROPION HCL ER (SR) 150 MG PO TB12: 150.0000 mg | ORAL_TABLET | Freq: Two times a day (BID) | ORAL | 0 refills | Status: DC

## 2020-07-23 ENCOUNTER — Telehealth: Payer: Self-pay | Admitting: Family Medicine

## 2020-07-23 NOTE — Telephone Encounter (Signed)
Copied from CRM [id number]. Topic: General - Other >> Jul 23, 2020  3:37 PM Gwenlyn Fudge wrote: Reason for CRM: Pt called and is requesting to have a referral for an eye doctor. She states that she has diabetes and that she just wants to check on them. Please advise.

## 2020-07-23 NOTE — Telephone Encounter (Signed)
Call placed to pt on number provided and there is no VM set up to leave a message.  Need name of pain medication.

## 2020-07-23 NOTE — Telephone Encounter (Signed)
Copied from CRM [id number]. Topic: General - Other >> Jul 23, 2020 11:50 AM Randol Kern wrote: Reason for CRM: Pt needs a call back from the office, regarding her back pain and pain medication (does not know the name) but she states that the pharmacy needs authorization first   Best contact: [phone]

## 2020-07-25 ENCOUNTER — Ambulatory Visit: Payer: Medicaid Other | Admitting: Podiatry

## 2020-08-04 ENCOUNTER — Ambulatory Visit: Payer: Self-pay

## 2020-08-06 ENCOUNTER — Other Ambulatory Visit: Payer: Self-pay

## 2020-08-06 ENCOUNTER — Encounter: Payer: Self-pay | Admitting: Podiatry

## 2020-08-06 ENCOUNTER — Ambulatory Visit (INDEPENDENT_AMBULATORY_CARE_PROVIDER_SITE_OTHER): Payer: Medicaid Other | Admitting: Podiatry

## 2020-08-06 DIAGNOSIS — L089 Local infection of the skin and subcutaneous tissue, unspecified: Secondary | ICD-10-CM

## 2020-08-06 DIAGNOSIS — S90829A Blister (nonthermal), unspecified foot, initial encounter: Secondary | ICD-10-CM

## 2020-08-08 ENCOUNTER — Other Ambulatory Visit: Payer: Self-pay | Admitting: Obstetrics and Gynecology

## 2020-08-08 NOTE — Patient Outreach (Signed)
Care Coordination  08/08/2020  Sarah Charles 08-Nov-1960 [id number]    Medicaid Managed Care   Unsuccessful Outreach Note  08/08/2020 Name: Sarah Charles MRN: [id number] DOB: 15-Apr-1961  Referred by: [name], MD Reason for referral : High Risk Managed Medicaid (Unsuccessful telephone outreach)   A second unsuccessful telephone outreach was attempted today. The patient was referred to the case management team for assistance with care management and care coordination.   Follow Up Plan: A member of the Managed Medicaid care management team will reach out to the patient again over the next 7 days.   Kathi Der RN, BSN [place]  [employer] - Managed Medicaid High Risk [phone]

## 2020-08-08 NOTE — Patient Instructions (Signed)
Visit Information  Ms. [name]  - as a part of your Medicaid benefit, you are eligible for care management and care coordination services at no cost or copay. I was unable to reach you by phone today but would be happy to help you with your health related needs. Please feel free to call me at [phone].  A member of the Managed Medicaid care management team will reach out to you again over the next 7 days.   Kathi Der RN, BSN [place]  [employer] - Managed Medicaid High Risk [phone].

## 2020-08-12 ENCOUNTER — Encounter: Payer: Self-pay | Admitting: Podiatry

## 2020-08-12 NOTE — Progress Notes (Signed)
Subjective:  Patient ID: Sarah Charles, female    DOB: 11/28/1960,  MRN: [id number]  Chief Complaint  Patient presents with  . Blister    PT stated that she is doing better she has no concerns and denies any pain     60 y.o. female presents with the above complaint.  Patient presents with a follow-up of bilateral plantar midfoot blisters.  She states that she is doing a lot better.  They are completely healed.  She denies any other acute complaints.   Review of Systems: Negative except as noted in the HPI. Denies N/V/F/Ch.  Past Medical History:  Diagnosis Date  . Arthritis   . Coronary artery calcification seen on CT scan    a. 07/2017 noted on CT abd.  . Depression   . GERD (gastroesophageal reflux disease)   . Hyperlipidemia   . Hypertension   . Obesity   . Sleep apnea    a. Does not use CPAP "cause i dont think I need it anymore".  . Tobacco abuse   . Type II diabetes mellitus (HCC)     Current Outpatient Medications:  .  amLODipine (NORVASC) 5 MG tablet, Take 1 tablet (5 mg total) by mouth daily., Disp: 180 tablet, Rfl: 1 .  aspirin EC 81 MG tablet, Take 81 mg by mouth daily., Disp: , Rfl:  .  B-D UF III MINI PEN NEEDLES 31G X 5 MM MISC, USE AS DIRECTED, Disp: 100 each, Rfl: 3 .  Blood Glucose Monitoring Suppl (ACCU-CHEK GUIDE ME) w/Device KIT, 1 each by Does not apply route 3 (three) times daily., Disp: 1 kit, Rfl: 0 .  buPROPion (WELLBUTRIN SR) 150 MG 12 hr tablet, Take 1 tablet (150 mg total) by mouth 2 (two) times daily., Disp: 84 tablet, Rfl: 0 .  cetirizine (ZYRTEC) 10 MG tablet, TAKE 1 TABLET BY MOUTH EVERY DAY (Patient not taking: Reported on 07/01/2020), Disp: 30 tablet, Rfl: 2 .  clopidogrel (PLAVIX) 75 MG tablet, TAKE 1 TABLET EVERY DAY, Disp: 90 tablet, Rfl: 1 .  dicyclomine (BENTYL) 10 MG capsule, Take 1 capsule by mouth daily. (Patient not taking: Reported on 07/01/2020), Disp: , Rfl:  .  doxycycline (VIBRA-TABS) 100 MG tablet, Take 1 tablet (100 mg total) by mouth  2 (two) times daily., Disp: 20 tablet, Rfl: 0 .  fenofibrate (TRICOR) 145 MG tablet, Take 1 tablet (145 mg total) by mouth daily., Disp: 90 tablet, Rfl: 3 .  FLUoxetine (PROZAC) 20 MG capsule, TAKE 1 CAPSULE BY MOUTH EVERY DAY, Disp: 90 capsule, Rfl: 0 .  gabapentin (NEURONTIN) 300 MG capsule, Take 1 capsule (300 mg total) by mouth 2 (two) times daily., Disp: 60 capsule, Rfl: 2 .  glucose blood (ACCU-CHEK GUIDE) test strip, USE AS DIRECTED, Disp: 100 strip, Rfl: 2 .  icosapent Ethyl (VASCEPA) 1 g capsule, Take 2 capsules (2 g total) by mouth 2 (two) times daily., Disp: 360 capsule, Rfl: 3 .  insulin glargine (LANTUS SOLOSTAR) 100 UNIT/ML Solostar Pen, Inject 55 Units into the skin at bedtime., Disp: 30 mL, Rfl: 3 .  Lancets Misc. (ACCU-CHEK FASTCLIX LANCET) KIT, As directed, Disp: 1 kit, Rfl: 0 .  liraglutide (VICTOZA) 18 MG/3ML SOPN, Start 0.6mg SQ once a day for 7 days, then increase to 1.2mg once a day, Disp: 6 mL, Rfl: 3 .  lisinopril (ZESTRIL) 20 MG tablet, TAKE 1 TABLET BY MOUTH EVERY DAY, Disp: 90 tablet, Rfl: 1 .  meloxicam (MOBIC) 7.5 MG tablet, Take 1 tablet (7.5 mg total)   by mouth daily. (Patient not taking: Reported on 07/01/2020), Disp: 10 tablet, Rfl: 0 .  methocarbamol (ROBAXIN) 500 MG tablet, Take 1 tablet (500 mg total) by mouth 2 (two) times daily as needed (back pain). (Patient not taking: Reported on 07/01/2020), Disp: 30 tablet, Rfl: 0 .  metoCLOPramide (REGLAN) 10 MG tablet, TAKE 1 TABLET BY MOUTH EVERY EIGHT HOURS AS NEEDED FOR NAUSEA OR VOMITING. (Patient not taking: Reported on 07/01/2020), Disp: 40 tablet, Rfl: 0 .  metoprolol tartrate (LOPRESSOR) 25 MG tablet, TAKE 1 TABLET BY MOUTH TWICE A DAY, Disp: 180 tablet, Rfl: 0 .  Multiple Vitamin (MULTIVITAMIN) tablet, Take 1 tablet by mouth daily. (Patient not taking: Reported on 07/01/2020), Disp: , Rfl:  .  nitroGLYCERIN (NITROSTAT) 0.4 MG SL tablet, Place 0.4 mg under the tongue every 5 (five) minutes as needed for chest pain., Disp: ,  Rfl:  .  pantoprazole (PROTONIX) 40 MG tablet, TAKE 1 TABLET EVERY DAY, Disp: 90 tablet, Rfl: 2 .  rosuvastatin (CRESTOR) 40 MG tablet, Take 1 tablet (40 mg total) by mouth daily., Disp: 90 tablet, Rfl: 1 .  sitaGLIPtin-metformin (JANUMET) 50-1000 MG tablet, Take 1 tablet by mouth 2 (two) times daily with a meal., Disp: 180 tablet, Rfl: 1 .  traZODone (DESYREL) 50 MG tablet, TAKE 1 TABLET AT BEDTIME AS NEEDED FOR SLEEP, Disp: 30 tablet, Rfl: 2 .  vitamin B-12 (CYANOCOBALAMIN) 500 MCG tablet, Take 500 mcg by mouth daily. (Patient not taking: Reported on 07/01/2020), Disp: , Rfl:  .  vitamin C (ASCORBIC ACID) 500 MG tablet, Take 500 mg by mouth daily. (Patient not taking: Reported on 07/01/2020), Disp: , Rfl:   Social History   Tobacco Use  Smoking Status Former Smoker  . Packs/day: 1.00  . Years: 41.00  . Pack years: 41.00  . Types: Cigarettes  . Quit date: 10/07/2018  . Years since quitting: 1.8  Smokeless Tobacco Never Used    Allergies  Allergen Reactions  . Cymbalta [Duloxetine Hcl] Nausea Only    Nausea, lack of therapeutic effect   Objective:  There were no vitals filed for this visit. There is no height or weight on file to calculate BMI. Constitutional Well developed. Well nourished.  Vascular Dorsalis pedis pulses palpable bilaterally. Posterior tibial pulses palpable bilaterally. Capillary refill normal to all digits.  No cyanosis or clubbing noted. Pedal hair growth normal.  Neurologic Normal speech. Oriented to person, place, and time. Epicritic sensation to light touch grossly present bilaterally.  Dermatologic  no further bilateral blister formation noted without surrounding erythema.  No concern for infection noted.  No drainage present.  No purulent drainage noted.  No wound present.  Orthopedic: Normal joint ROM without pain or crepitus bilaterally. No visible deformities. No bony tenderness.   Radiographs: None Assessment:   1. Blister of foot with  infection, unspecified laterality, initial encounter    Plan:  Patient was evaluated and treated and all questions answered.  Bilateral plantar midfoot blister -Clinically healed.  No further blister formation noted.  She has completed course of doxycycline.  At this time I discussed with her prevention techniques and shoe gear modification.  Patient states understanding and will do so.  If any foot and ankle issues arise in the future come back and see me.  She states understanding.  No follow-ups on file. 

## 2020-08-13 ENCOUNTER — Other Ambulatory Visit: Payer: Self-pay | Admitting: Family Medicine

## 2020-08-13 MED ORDER — CLOPIDOGREL BISULFATE 75 MG PO TABS: 75.0000 mg | ORAL_TABLET | Freq: Every day | ORAL | 1 refills | Status: DC

## 2020-08-14 ENCOUNTER — Other Ambulatory Visit: Payer: Self-pay

## 2020-08-14 ENCOUNTER — Encounter: Payer: Self-pay | Admitting: Physician Assistant

## 2020-08-14 ENCOUNTER — Ambulatory Visit: Payer: Medicaid Other | Attending: Physician Assistant | Admitting: Physician Assistant

## 2020-08-14 VITALS — BP 185/79 | HR 66 | Wt 180.0 lb

## 2020-08-14 DIAGNOSIS — E1165 Type 2 diabetes mellitus with hyperglycemia: Secondary | ICD-10-CM

## 2020-08-14 DIAGNOSIS — L0292 Furuncle, unspecified: Secondary | ICD-10-CM | POA: Diagnosis not present

## 2020-08-14 DIAGNOSIS — R110 Nausea: Secondary | ICD-10-CM

## 2020-08-14 DIAGNOSIS — E1169 Type 2 diabetes mellitus with other specified complication: Secondary | ICD-10-CM | POA: Diagnosis not present

## 2020-08-14 DIAGNOSIS — Z794 Long term (current) use of insulin: Secondary | ICD-10-CM | POA: Diagnosis not present

## 2020-08-14 DIAGNOSIS — I10 Essential (primary) hypertension: Secondary | ICD-10-CM | POA: Diagnosis not present

## 2020-08-14 DIAGNOSIS — IMO0002 Reserved for concepts with insufficient information to code with codable children: Secondary | ICD-10-CM

## 2020-08-14 DIAGNOSIS — I2510 Atherosclerotic heart disease of native coronary artery without angina pectoris: Secondary | ICD-10-CM | POA: Diagnosis not present

## 2020-08-14 DIAGNOSIS — F32A Depression, unspecified: Secondary | ICD-10-CM | POA: Diagnosis not present

## 2020-08-14 DIAGNOSIS — I739 Peripheral vascular disease, unspecified: Secondary | ICD-10-CM | POA: Diagnosis not present

## 2020-08-14 DIAGNOSIS — E118 Type 2 diabetes mellitus with unspecified complications: Secondary | ICD-10-CM

## 2020-08-14 DIAGNOSIS — E1142 Type 2 diabetes mellitus with diabetic polyneuropathy: Secondary | ICD-10-CM

## 2020-08-14 DIAGNOSIS — E785 Hyperlipidemia, unspecified: Secondary | ICD-10-CM

## 2020-08-14 DIAGNOSIS — K219 Gastro-esophageal reflux disease without esophagitis: Secondary | ICD-10-CM

## 2020-08-14 LAB — GLUCOSE, POCT (MANUAL RESULT ENTRY): POC Glucose: 266 mg/dl — AB (ref 70–99)

## 2020-08-14 LAB — POCT GLYCOSYLATED HEMOGLOBIN (HGB A1C): Hemoglobin A1C: 8.2 % — AB (ref 4.0–5.6)

## 2020-08-14 MED ORDER — LISINOPRIL 20 MG PO TABS: 20.0000 mg | ORAL_TABLET | Freq: Every day | ORAL | 1 refills | Status: DC

## 2020-08-14 MED ORDER — VITAMIN B-12 500 MCG PO TABS: 500.0000 ug | ORAL_TABLET | Freq: Every day | ORAL | 3 refills | Status: DC

## 2020-08-14 MED ORDER — TRAZODONE HCL 50 MG PO TABS: 50.0000 mg | ORAL_TABLET | Freq: Every evening | ORAL | 2 refills | Status: DC | PRN

## 2020-08-14 MED ORDER — JANUMET 50-1000 MG PO TABS: 1.0000 | ORAL_TABLET | Freq: Two times a day (BID) | ORAL | 1 refills | Status: DC

## 2020-08-14 MED ORDER — BUPROPION HCL ER (SR) 150 MG PO TB12: 150.0000 mg | ORAL_TABLET | Freq: Two times a day (BID) | ORAL | 1 refills | Status: DC

## 2020-08-14 MED ORDER — CLOPIDOGREL BISULFATE 75 MG PO TABS
75.0000 mg | ORAL_TABLET | Freq: Every day | ORAL | 1 refills | Status: DC
Start: 2020-08-14 — End: 2021-03-18

## 2020-08-14 MED ORDER — ACCU-CHEK GUIDE VI STRP: ORAL_STRIP | 2 refills | Status: DC

## 2020-08-14 MED ORDER — METHOCARBAMOL 500 MG PO TABS: 500.0000 mg | ORAL_TABLET | Freq: Two times a day (BID) | ORAL | 0 refills | Status: DC | PRN

## 2020-08-14 MED ORDER — PANTOPRAZOLE SODIUM 40 MG PO TBEC: 40.0000 mg | DELAYED_RELEASE_TABLET | Freq: Every day | ORAL | 2 refills | Status: DC

## 2020-08-14 MED ORDER — METOCLOPRAMIDE HCL 10 MG PO TABS: ORAL_TABLET | ORAL | 0 refills | Status: DC

## 2020-08-14 MED ORDER — FENOFIBRATE 145 MG PO TABS: 145.0000 mg | ORAL_TABLET | Freq: Every day | ORAL | 3 refills | Status: DC

## 2020-08-14 MED ORDER — DOXYCYCLINE HYCLATE 100 MG PO TABS: 100.0000 mg | ORAL_TABLET | Freq: Two times a day (BID) | ORAL | 0 refills | Status: DC

## 2020-08-14 MED ORDER — ROSUVASTATIN CALCIUM 40 MG PO TABS
40.0000 mg | ORAL_TABLET | Freq: Every day | ORAL | 1 refills | Status: DC
Start: 2020-08-14 — End: 2021-04-15

## 2020-08-14 MED ORDER — LANTUS SOLOSTAR 100 UNIT/ML ~~LOC~~ SOPN
60.0000 [IU] | PEN_INJECTOR | Freq: Every day | SUBCUTANEOUS | 3 refills | Status: DC
Start: 2020-08-14 — End: 2021-05-05

## 2020-08-14 MED ORDER — FLUCONAZOLE 150 MG PO TABS: 150.0000 mg | ORAL_TABLET | Freq: Once | ORAL | 0 refills | Status: AC

## 2020-08-14 MED ORDER — GABAPENTIN 300 MG PO CAPS: 300.0000 mg | ORAL_CAPSULE | Freq: Two times a day (BID) | ORAL | 2 refills | Status: DC

## 2020-08-14 MED ORDER — MUPIROCIN 2 % EX OINT: 1.0000 "application " | TOPICAL_OINTMENT | Freq: Two times a day (BID) | CUTANEOUS | 0 refills | Status: DC

## 2020-08-14 MED ORDER — AMLODIPINE BESYLATE 5 MG PO TABS: 5.0000 mg | ORAL_TABLET | Freq: Every day | ORAL | 1 refills | Status: DC

## 2020-08-14 MED ORDER — ACCU-CHEK FASTCLIX LANCET KIT: PACK | 0 refills | Status: DC

## 2020-08-14 MED ORDER — BD PEN NEEDLE MINI U/F 31G X 5 MM MISC: 3 refills | Status: DC

## 2020-08-14 MED ORDER — VICTOZA 18 MG/3ML ~~LOC~~ SOPN
PEN_INJECTOR | SUBCUTANEOUS | 3 refills | Status: DC
Start: 2020-08-14 — End: 2021-02-06

## 2020-08-14 MED ORDER — FLUOXETINE HCL 20 MG PO CAPS: ORAL_CAPSULE | ORAL | 1 refills | Status: DC

## 2020-08-14 MED ORDER — METOPROLOL TARTRATE 25 MG PO TABS: 25.0000 mg | ORAL_TABLET | Freq: Two times a day (BID) | ORAL | 3 refills | Status: DC

## 2020-08-14 NOTE — Progress Notes (Signed)
Sarah Charles, is a 60 y.o. female  GSU:[id number]  OPF:[id number]  DOB - 1961/05/07  Subjective:  Chief Complaint and HPI: Sarah Charles is a 60 y.o. female here today for boil under right arm.  She has had this before.  It started to drain on it's own.  It is very tender.  No fever.   Compliant with meds but not with diet.  Blood sugars usu in the 200s.    Completely out of BP meds  ROS:   Constitutional:  No f/c, No night sweats, No unexplained weight loss. EENT:  No vision changes, No blurry vision, No hearing changes. No mouth, throat, or ear problems.  Respiratory: No cough, No SOB Cardiac: No CP, no palpitations GI:  No abd pain, No N/V/D. GU: No Urinary s/sx Musculoskeletal: No joint pain Neuro: No headache, no dizziness, no motor weakness.  Skin: No rash Endocrine:  No polydipsia. No polyuria.  Psych: Denies SI/HI  No problems updated.  ALLERGIES: Allergies  Allergen Reactions  . Cymbalta [Duloxetine Hcl] Nausea Only    Nausea, lack of therapeutic effect    PAST MEDICAL HISTORY: Past Medical History:  Diagnosis Date  . Arthritis   . Coronary artery calcification seen on CT scan    a. 07/2017 noted on CT abd.  . Depression   . GERD (gastroesophageal reflux disease)   . Hyperlipidemia   . Hypertension   . Obesity   . Sleep apnea    a. Does not use CPAP "cause i dont think I need it anymore".  . Tobacco abuse   . Type II diabetes mellitus ([facility])     MEDICATIONS AT HOME: Prior to Admission medications   Medication Sig Start Date End Date Taking? Authorizing Provider  aspirin EC 81 MG tablet Take 81 mg by mouth daily.   Yes [provider]  Blood Glucose Monitoring Suppl (ACCU-CHEK GUIDE ME) w/Device KIT 1 each by Does not apply route 3 (three) times daily. 11/09/18  Yes Mirranda Monrroy, Levada Dy M, PA-C  cetirizine (ZYRTEC) 10 MG tablet TAKE 1 TABLET BY MOUTH EVERY DAY 08/02/19  Yes Newlin, Charlane Ferretti, MD  dicyclomine (BENTYL) 10 MG capsule Take 1 capsule by  mouth daily. 05/26/18  Yes [provider]  fluconazole (DIFLUCAN) 150 MG tablet Take 1 tablet (150 mg total) by mouth once for 1 dose. If needed for yeast infection after antibiotics 08/14/20 08/14/20 Yes Shekina Cordell, Dionne Bucy, PA-C  meloxicam (MOBIC) 7.5 MG tablet Take 1 tablet (7.5 mg total) by mouth daily. 02/04/20  Yes Charlott Rakes, MD  Multiple Vitamin (MULTIVITAMIN) tablet Take 1 tablet by mouth daily.   Yes [provider]  mupirocin ointment (BACTROBAN) 2 % Apply 1 application topically 2 (two) times daily. X 7 days at the first sign of skin infection 08/14/20  Yes Nyaira Hodgens, Dionne Bucy, PA-C  nitroGLYCERIN (NITROSTAT) 0.4 MG SL tablet Place 0.4 mg under the tongue every 5 (five) minutes as needed for chest pain.   Yes [provider]  vitamin C (ASCORBIC ACID) 500 MG tablet Take 500 mg by mouth daily.   Yes [provider]  amLODipine (NORVASC) 5 MG tablet Take 1 tablet (5 mg total) by mouth daily. 08/14/20 11/12/20  [name], PA-C  buPROPion (WELLBUTRIN SR) 150 MG 12 hr tablet Take 1 tablet (150 mg total) by mouth 2 (two) times daily. 08/14/20   [name], PA-C  clopidogrel (PLAVIX) 75 MG tablet Take 1 tablet (75 mg total) by mouth daily. 08/14/20  Freeman Caldron M, PA-C  doxycycline (VIBRA-TABS) 100 MG tablet Take 1 tablet (100 mg total) by mouth 2 (two) times daily. 08/14/20   [name], PA-C  fenofibrate (TRICOR) 145 MG tablet Take 1 tablet (145 mg total) by mouth daily. 08/14/20   [name], PA-C  FLUoxetine (PROZAC) 20 MG capsule TAKE 1 CAPSULE BY MOUTH EVERY DAY 08/14/20   [name], PA-C  gabapentin (NEURONTIN) 300 MG capsule Take 1 capsule (300 mg total) by mouth 2 (two) times daily. 08/14/20   [name], PA-C  glucose blood (ACCU-CHEK GUIDE) test strip USE AS DIRECTED 08/14/20   [name], PA-C  insulin glargine (LANTUS SOLOSTAR) 100 UNIT/ML Solostar Pen Inject 60 Units into the skin at bedtime. 08/14/20    [name], PA-C  Insulin Pen Needle (B-D UF III MINI PEN NEEDLES) 31G X 5 MM MISC USE AS DIRECTED 08/14/20   [name], PA-C  Lancets Misc. (ACCU-CHEK FASTCLIX LANCET) KIT As directed 08/14/20   [name], PA-C  liraglutide (VICTOZA) 18 MG/3ML SOPN Sq 1.59m once a day 08/14/20   MArgentina Donovan PA-C  lisinopril (ZESTRIL) 20 MG tablet Take 1 tablet (20 mg total) by mouth daily. 08/14/20   MArgentina Donovan PA-C  methocarbamol (ROBAXIN) 500 MG tablet Take 1 tablet (500 mg total) by mouth 2 (two) times daily as needed (back pain). 08/14/20   MArgentina Donovan PA-C  metoCLOPramide (REGLAN) 10 MG tablet TAKE 1 TABLET BY MOUTH EVERY EIGHT HOURS AS NEEDED FOR NAUSEA OR VOMITING. 08/14/20   MArgentina Donovan PA-C  metoprolol tartrate (LOPRESSOR) 25 MG tablet Take 1 tablet (25 mg total) by mouth 2 (two) times daily. 08/14/20   MArgentina Donovan PA-C  pantoprazole (PROTONIX) 40 MG tablet Take 1 tablet (40 mg total) by mouth daily. 08/14/20   MArgentina Donovan PA-C  rosuvastatin (CRESTOR) 40 MG tablet Take 1 tablet (40 mg total) by mouth daily. 08/14/20   MArgentina Donovan PA-C  sitaGLIPtin-metformin (JANUMET) 50-1000 MG tablet Take 1 tablet by mouth 2 (two) times daily with a meal. 08/14/20   Alvina Strother, ADionne Bucy PA-C  traZODone (DESYREL) 50 MG tablet Take 1 tablet (50 mg total) by mouth at bedtime as needed. for sleep 08/14/20   MArgentina Donovan PA-C  vitamin B-12 (CYANOCOBALAMIN) 500 MCG tablet Take 1 tablet (500 mcg total) by mouth daily. 08/14/20   MArgentina Donovan PA-C     Objective:  EXAM:   Vitals:   08/14/20 1101  BP: (!) 185/79  Pulse: 66  SpO2: 98%  Weight: 180 lb (81.6 kg)    General appearance : A&OX3. NAD. Non-toxic-appearing HEENT: Atraumatic and Normocephalic.  PERRLA. EOM intact.   Chest/Lungs:  Breathing-non-labored, Good air entry bilaterally, breath sounds normal without rales, rhonchi, or wheezing  CVS: S1 S2 regular, no murmurs, gallops, rubs  R axilla  ~2cm erythematous and slightly indurated boil that has drained some and scabbed back over.  Moderately TTP.  No LN Extremities: Bilateral Lower Ext shows no edema, both legs are warm to touch with = pulse throughout Neurology:  CN II-XII grossly intact, Non focal.   Psych:  TP linear. J/I WNL. Normal speech. Appropriate eye contact and affect.  Skin:  No Rash  Data Review Lab Results  Component Value Date   HGBA1C 8.2 (A) 08/14/2020   HGBA1C 11.1 (A) 12/10/2019   HGBA1C 11.2 (H) 07/23/2019     Assessment & Plan   1. Uncontrolled  type 2 diabetes mellitus with complication, with long-term current use of insulin (HCC) Improving but not at goal.  Continue regimen but increase dose of lantus from 55 to 60.  Check blood sugars - Glucose (CBG) - POCT glycosylated hemoglobin (Hb A1C) - Insulin Pen Needle (B-D UF III MINI PEN NEEDLES) 31G X 5 MM MISC; USE AS DIRECTED  Dispense: 100 each; Refill: 3 - insulin glargine (LANTUS SOLOSTAR) 100 UNIT/ML Solostar Pen; Inject 60 Units into the skin at bedtime.  Dispense: 30 mL; Refill: 3 - liraglutide (VICTOZA) 18 MG/3ML SOPN; Sq 1.74m once a day  Dispense: 6 mL; Refill: 3 - Comprehensive metabolic panel - lisinopril (ZESTRIL) 20 MG tablet; Take 1 tablet (20 mg total) by mouth daily.  Dispense: 90 tablet; Refill: 1  2. Essential hypertension Out of meds for ~5 days.  Resume meds - amLODipine (NORVASC) 5 MG tablet; Take 1 tablet (5 mg total) by mouth daily.  Dispense: 180 tablet; Refill: 1 - metoCLOPramide (REGLAN) 10 MG tablet; TAKE 1 TABLET BY MOUTH EVERY EIGHT HOURS AS NEEDED FOR NAUSEA OR VOMITING.  Dispense: 40 tablet; Refill: 0 - lisinopril (ZESTRIL) 20 MG tablet; Take 1 tablet (20 mg total) by mouth daily.  Dispense: 90 tablet; Refill: 1 - metoprolol tartrate (LOPRESSOR) 25 MG tablet; Take 1 tablet (25 mg total) by mouth 2 (two) times daily.  Dispense: 180 tablet; Refill: 3  3. Diabetic polyneuropathy associated with type 2 diabetes mellitus  (HCC) - gabapentin (NEURONTIN) 300 MG capsule; Take 1 capsule (300 mg total) by mouth 2 (two) times daily.  Dispense: 60 capsule; Refill: 2  4. Type 2 diabetes mellitus with hyperglycemia, with long-term current use of insulin (HCC) - insulin glargine (LANTUS SOLOSTAR) 100 UNIT/ML Solostar Pen; Inject 60 Units into the skin at bedtime.  Dispense: 30 mL; Refill: 3 - liraglutide (VICTOZA) 18 MG/3ML SOPN; Sq 1.222monce a day  Dispense: 6 mL; Refill: 3 - glucose blood (ACCU-CHEK GUIDE) test strip; USE AS DIRECTED  Dispense: 100 strip; Refill: 2 - rosuvastatin (CRESTOR) 40 MG tablet; Take 1 tablet (40 mg total) by mouth daily.  Dispense: 90 tablet; Refill: 1 - sitaGLIPtin-metformin (JANUMET) 50-1000 MG tablet; Take 1 tablet by mouth 2 (two) times daily with a meal.  Dispense: 180 tablet; Refill: 1  5. Nausea - metoCLOPramide (REGLAN) 10 MG tablet; TAKE 1 TABLET BY MOUTH EVERY EIGHT HOURS AS NEEDED FOR NAUSEA OR VOMITING.  Dispense: 40 tablet; Refill: 0  6. Gastroesophageal reflux disease without esophagitis - pantoprazole (PROTONIX) 40 MG tablet; Take 1 tablet (40 mg total) by mouth daily.  Dispense: 90 tablet; Refill: 2  7. Claudication (HCWillard- rosuvastatin (CRESTOR) 40 MG tablet; Take 1 tablet (40 mg total) by mouth daily.  Dispense: 90 tablet; Refill: 1 - clopidogrel (PLAVIX) 75 MG tablet; Take 1 tablet (75 mg total) by mouth daily.  Dispense: 90 tablet; Refill: 1  8. Coronary artery disease involving native coronary artery of native heart without angina pectoris  - rosuvastatin (CRESTOR) 40 MG tablet; Take 1 tablet (40 mg total) by mouth daily.  Dispense: 90 tablet; Refill: 1 - sitaGLIPtin-metformin (JANUMET) 50-1000 MG tablet; Take 1 tablet by mouth 2 (two) times daily with a meal.  Dispense: 180 tablet; Refill: 1  9. Hyperlipidemia associated with type 2 diabetes mellitus (HCC) - Lipid panel - rosuvastatin (CRESTOR) 40 MG tablet; Take 1 tablet (40 mg total) by mouth daily.  Dispense: 90  tablet; Refill: 1  10. Depression, unspecified depression type stable - FLUoxetine (PROZAC) 20  MG capsule; TAKE 1 CAPSULE BY MOUTH EVERY DAY  Dispense: 90 capsule; Refill: 1 - traZODone (DESYREL) 50 MG tablet; Take 1 tablet (50 mg total) by mouth at bedtime as needed. for sleep  Dispense: 30 tablet; Refill: 2 - buPROPion (WELLBUTRIN SR) 150 MG 12 hr tablet; Take 1 tablet (150 mg total) by mouth 2 (two) times daily.  Dispense: 180 tablet; Refill: 1  11. Boil - doxycycline (VIBRA-TABS) 100 MG tablet; Take 1 tablet (100 mg total) by mouth 2 (two) times daily.  Dispense: 20 tablet; Refill: 0 - mupirocin ointment (BACTROBAN) 2 %; Apply 1 application topically 2 (two) times daily. X 7 days at the first sign of skin infection  Dispense: 22 g; Refill: 0 - fluconazole (DIFLUCAN) 150 MG tablet; Take 1 tablet (150 mg total) by mouth once for 1 dose. If needed for yeast infection after antibiotics  Dispense: 1 tablet; Refill: 0     Patient have been counseled extensively about nutrition and exercise  Return in about 3 months (around 11/14/2020) for Dr Margarita Rana; chronic conditions.  The patient was given clear instructions to go to ER or return to medical center if symptoms don't improve, worsen or new problems develop. The patient verbalized understanding. The patient was told to call to get lab results if they haven't heard anything in the next week.     Freeman Caldron, PA-C [facility] and [place] [place]   08/14/2020, 11:18 AMPatient ID: Bert Givans, female   DOB: 1960-12-21, 60 y.o.   MRN: [id number]

## 2020-08-15 ENCOUNTER — Other Ambulatory Visit: Payer: Self-pay

## 2020-08-15 LAB — COMPREHENSIVE METABOLIC PANEL
ALT: 10 IU/L (ref 0–32)
AST: 12 IU/L (ref 0–40)
Albumin/Globulin Ratio: 1.6 (ref 1.2–2.2)
Albumin: 3.9 g/dL (ref 3.8–4.9)
Alkaline Phosphatase: 124 IU/L — ABNORMAL HIGH (ref 44–121)
BUN/Creatinine Ratio: 32 — ABNORMAL HIGH (ref 9–23)
BUN: 16 mg/dL (ref 6–24)
Bilirubin Total: <0.2 mg/dL (ref 0.0–1.2)
CO2: 23 mmol/L (ref 20–29)
Calcium: 9.5 mg/dL (ref 8.7–10.2)
Chloride: 97 mmol/L (ref 96–106)
Creatinine, Ser: 0.5 mg/dL — ABNORMAL LOW (ref 0.57–1.00)
Globulin, Total: 2.5 g/dL (ref 1.5–4.5)
Glucose: 239 mg/dL — ABNORMAL HIGH (ref 65–99)
Potassium: 4.4 mmol/L (ref 3.5–5.2)
Sodium: 137 mmol/L (ref 134–144)
Total Protein: 6.4 g/dL (ref 6.0–8.5)
eGFR: 108 mL/min/{1.73_m2} (ref >59)

## 2020-08-15 LAB — LIPID PANEL
Chol/HDL Ratio: 3.9 ratio (ref 0.0–4.4)
Cholesterol, Total: 208 mg/dL — ABNORMAL HIGH (ref 100–199)
HDL: 53 mg/dL (ref >39)
LDL Chol Calc (NIH): 122 mg/dL — ABNORMAL HIGH (ref 0–99)
Triglycerides: 187 mg/dL — ABNORMAL HIGH (ref 0–149)
VLDL Cholesterol Cal: 33 mg/dL (ref 5–40)

## 2020-08-18 ENCOUNTER — Telehealth: Payer: Self-pay | Admitting: Family Medicine

## 2020-08-18 DIAGNOSIS — IMO0002 Reserved for concepts with insufficient information to code with codable children: Secondary | ICD-10-CM

## 2020-08-18 DIAGNOSIS — E1165 Type 2 diabetes mellitus with hyperglycemia: Secondary | ICD-10-CM

## 2020-08-18 NOTE — Telephone Encounter (Signed)
Pt is calling and would like dr Alvis Lemmings recommendation for eye doctor. Pt has dm. Pt has [employer]

## 2020-08-19 NOTE — Telephone Encounter (Signed)
Will route to PCP for review. 

## 2020-08-19 NOTE — Telephone Encounter (Signed)
I have placed a referral

## 2020-08-20 NOTE — Telephone Encounter (Signed)
Pt was called and a VM ws left informing pt that referral has been placed.

## 2020-08-27 ENCOUNTER — Other Ambulatory Visit: Payer: Self-pay

## 2020-08-27 ENCOUNTER — Encounter: Payer: Self-pay | Admitting: Physician Assistant

## 2020-08-27 ENCOUNTER — Ambulatory Visit: Payer: Medicaid Other | Attending: Physician Assistant | Admitting: Physician Assistant

## 2020-08-27 DIAGNOSIS — IMO0002 Reserved for concepts with insufficient information to code with codable children: Secondary | ICD-10-CM

## 2020-08-27 DIAGNOSIS — M545 Low back pain, unspecified: Secondary | ICD-10-CM

## 2020-08-27 DIAGNOSIS — Z1211 Encounter for screening for malignant neoplasm of colon: Secondary | ICD-10-CM

## 2020-08-27 DIAGNOSIS — E1165 Type 2 diabetes mellitus with hyperglycemia: Secondary | ICD-10-CM | POA: Diagnosis not present

## 2020-08-27 DIAGNOSIS — E1142 Type 2 diabetes mellitus with diabetic polyneuropathy: Secondary | ICD-10-CM | POA: Diagnosis not present

## 2020-08-27 DIAGNOSIS — G4730 Sleep apnea, unspecified: Secondary | ICD-10-CM | POA: Diagnosis not present

## 2020-08-27 DIAGNOSIS — Z794 Long term (current) use of insulin: Secondary | ICD-10-CM

## 2020-08-27 DIAGNOSIS — E118 Type 2 diabetes mellitus with unspecified complications: Secondary | ICD-10-CM | POA: Diagnosis not present

## 2020-08-27 MED ORDER — GABAPENTIN 300 MG PO CAPS: 300.0000 mg | ORAL_CAPSULE | Freq: Three times a day (TID) | ORAL | 2 refills | Status: DC

## 2020-08-27 NOTE — Progress Notes (Signed)
Virtual Visit via Telephone Note  I connected with Sarah Charles on 08/27/20 at  9:30 AM EDT by telephone and verified that I am speaking with the correct person using two identifiers.  Location: Patient: home Provider: [facility] office   I discussed the limitations, risks, security and privacy concerns of performing an evaluation and management service by telephone and the availability of in person appointments. I also discussed with the patient that there may be a patient responsible charge related to this service. The patient expressed understanding and agreed to proceed.   History of Present Illness:  Patient would like to get referral for ophthalmology, colon CA screening, and to see a specialist for back pain.  She would also like to increase dose of gabapentin for neuropathy.  Also needs sleep study.  She was diagnosed with sleep apnea years ago and left her CPAP machine when she moved more than 1 year ago.      Observations/Objective: NAD.  A&Ox3  Assessment and Plan: 1. Low back pain without sciatica, unspecified back pain laterality, unspecified chronicity - Ambulatory referral to Orthopedic Surgery  2. Sleep apnea, unspecified type - Split night study; Future  3. Screening for colon cancer - Ambulatory referral to Gastroenterology  4. Diabetic polyneuropathy associated with type 2 diabetes mellitus (HCC) Increase dose from bid to tid - gabapentin (NEURONTIN) 300 MG capsule; Take 1 capsule (300 mg total) by mouth 3 (three) times daily.  Dispense: 90 capsule; Refill: 2  5. Uncontrolled type 2 diabetes mellitus with complication, with long-term current use of insulin (HCC) Check glucose, take all meds as directed, and follow plan as of visit in march 2022.   - Ambulatory referral to Ophthalmology    Follow Up Instructions: Has June appt with Dr Alvis Lemmings   I discussed the assessment and treatment plan with the patient. The patient was provided an opportunity to ask questions and  all were answered. The patient agreed with the plan and demonstrated an understanding of the instructions.   The patient was advised to call back or seek an in-person evaluation if the symptoms worsen or if the condition fails to improve as anticipated.  I provided  16 minutes of non-face-to-face time during this encounter.   Georgian Co, PA-C  Patient ID: Sarah Charles, female   DOB: Nov 23, 1960, 60 y.o.   MRN: [id number]

## 2020-09-03 ENCOUNTER — Ambulatory Visit: Payer: Medicaid Other | Admitting: Family Medicine

## 2020-09-03 ENCOUNTER — Ambulatory Visit (INDEPENDENT_AMBULATORY_CARE_PROVIDER_SITE_OTHER): Payer: Medicaid Other

## 2020-09-03 ENCOUNTER — Other Ambulatory Visit: Payer: Self-pay

## 2020-09-03 ENCOUNTER — Other Ambulatory Visit: Payer: Medicaid Other

## 2020-09-03 ENCOUNTER — Encounter: Payer: Self-pay | Admitting: Family Medicine

## 2020-09-03 DIAGNOSIS — G8929 Other chronic pain: Secondary | ICD-10-CM | POA: Diagnosis not present

## 2020-09-03 DIAGNOSIS — M5442 Lumbago with sciatica, left side: Secondary | ICD-10-CM

## 2020-09-03 DIAGNOSIS — M5441 Lumbago with sciatica, right side: Secondary | ICD-10-CM | POA: Diagnosis not present

## 2020-09-03 NOTE — Progress Notes (Signed)
Office Visit Note   Patient: Sarah Charles           Date of Birth: 25-Nov-1960           MRN: [id number] Visit Date: 09/03/2020 Requested by: Anders Simmonds, PA-C [id number] [place],  [place] [address] PCP: [name], MD  Subjective: Chief Complaint  Patient presents with  . Lower Back - Pain    Chronic pain across her lower back. Constant pain. Hurts worse with walking. Pain radiates down both legs to her feet. Numbness and tingling in legs and feet. 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    HPI: She is here with low back pain.  Longstanding problems with her back dating to at least 2013 when she had x-rays and an MRI scan showing spondylosis.  Over the years she has had progressively worsening pain with radiation down both of her legs when she stands and walks.  She can no longer walk around the block without having to sit down.  Even when she sits, she still has to shift positions occasionally.  She has tried Tylenol with minimal improvement.  She has never been to a physical therapist or chiropractor for this, has not had injections and does not want any.  Denies any bowel or bladder dysfunction.                ROS: No fevers or chills.  She has diabetes which is poorly controlled.  All other systems were reviewed and are negative.  Objective: Vital Signs: There were no vitals taken for this visit.  Physical Exam:  General:  Alert and oriented, in no acute distress. Pulm:  Breathing unlabored. Psy:  Normal mood, congruent affect. Skin: No rash Low back: She is tender along the midline from about L2-S1, along the spinous processes and in the paraspinous muscles.  No pain in the sciatic notch area.  Lower extremity strength and reflexes are normal.   Imaging: XR Lumbar Spine 2-3 Views  Result Date: 09/03/2020 Lumbar x-rays reveal moderate to severe L2-3 and L5-S1 degenerative disc disease,  significantly worse than 2013.  No sign of compression fracture or neoplasm.   Assessment & Plan: 1.  Chronic low back pain, suspicious for spinal stenosis.  She has L2-3 and L5-S1 degenerative disc disease on x-ray. -Discussed options with her and elected to try physical therapy.  If she fails to improve, then possibly new MRI scan followed by either chiropractic or epidural injection depending on findings.     Procedures: No procedures performed        PMFS History: Patient Active Problem List   Diagnosis Date Noted  . CAD (coronary artery disease) 11/22/2017  . Chest pain, precordial 11/04/2017  . Acute chest pain   . Type 2 diabetes mellitus with hyperlipidemia (HCC) 10/29/2017  . Unstable angina (HCC) 10/29/2017  . Diabetic polyneuropathy associated with type 2 diabetes mellitus (HCC) 05/30/2017  . Mixed dyslipidemia 05/30/2017  . Depression 05/31/2016  . Hypertension 05/31/2016  . Uncontrolled type 2 diabetes mellitus with complication (HCC) 04/08/2016  . Uncontrolled type 2 diabetes mellitus with complication, with long-term current use of insulin (HCC) 04/20/2015  . Cellulitis of breast 12/29/2013  . Diabetes mellitus (HCC) 12/29/2013  . Cellulitis of female breast 12/29/2013  . Symptomatic menopausal or female climacteric states 04/08/2008  . OTHER SPECIFIED DISEASE OF NAIL 04/08/2008  . URINARY URGENCY 04/08/2008  .  COUGH 04/02/2008  . HYPERTENSION, BENIGN ESSENTIAL 04/12/2007  . FATIGUE 04/12/2007  . OBESITY, NOS 07/21/2006  . Major depressive disorder, recurrent episode (HCC) 07/21/2006  . TOBACCO DEPENDENCE 07/21/2006  . GASTROESOPHAGEAL REFLUX, NO ESOPHAGITIS 07/21/2006  . INCONTINENCE, STRESS, FEMALE 07/21/2006   Past Medical History:  Diagnosis Date  . Arthritis   . Coronary artery calcification seen on CT scan    a. 07/2017 noted on CT abd.  . Depression   . GERD (gastroesophageal reflux disease)   . Hyperlipidemia   . Hypertension   . Obesity    . Sleep apnea    a. Does not use CPAP "cause i dont think I need it anymore".  . Tobacco abuse   . Type II diabetes mellitus (HCC)     Family History  Problem Relation Age of Onset  . Hypertension Mother   . CVA Mother   . COPD Mother   . Heart disease Mother   . Kidney Stones Mother   . Heart attack Mother        MI in late 6's  . Breast cancer Mother   . CAD Father   . Hypercholesterolemia Father   . Heart attack Father        Died @ 25 of MI  . Cancer Maternal Uncle   . Diabetes Maternal Grandmother   . Cancer Maternal Grandfather        lung cancer  . Diabetes Sister     Past Surgical History:  Procedure Laterality Date  . CHOLECYSTECTOMY    . CORONARY STENT INTERVENTION N/A 11/04/2017   Procedure: CORONARY STENT INTERVENTION;  Surgeon: Swaziland, Peter M, MD;  Location: [facility] INVASIVE CV LAB;  Service: Cardiovascular;  Laterality: N/A;  . INCISION AND DRAINAGE ABSCESS Left 12/22/2012   Procedure: INCISION AND DRAINAGE ABSCESS;  Surgeon: Dalia Heading, MD;  Location: [facility];  Service: General;  Laterality: Left;  . INTRAVASCULAR PRESSURE WIRE/FFR STUDY N/A 11/04/2017   Procedure: INTRAVASCULAR PRESSURE WIRE/FFR STUDY;  Surgeon: Swaziland, Peter M, MD;  Location: [facility] INVASIVE CV LAB;  Service: Cardiovascular;  Laterality: N/A;  . LEFT HEART CATH AND CORONARY ANGIOGRAPHY N/A 11/04/2017   Procedure: LEFT HEART CATH AND CORONARY ANGIOGRAPHY;  Surgeon: Swaziland, Peter M, MD;  Location: [facility] INVASIVE CV LAB;  Service: Cardiovascular;  Laterality: N/A;  . TONSILLECTOMY    . TUBAL LIGATION     Social History   Occupational History  . Not on file  Tobacco Use  . Smoking status: Former Smoker    Packs/day: 1.00    Years: 41.00    Pack years: 41.00    Types: Cigarettes    Quit date: 10/07/2018    Years since quitting: 1.9  . Smokeless tobacco: Never Used  Substance and Sexual Activity  . Alcohol use: No  . Drug use: No  . Sexual activity: Never    Birth control/protection: None

## 2020-09-03 NOTE — Patient Outreach (Signed)
Accu-Chek Fastclix Lancets      Yes  Accu-Chek Test Guide Test Strips      No  Amlodipine 5mg   1    No  Aspirin Ec 81mg   1    No  Bupropion SR 150mg   1  1  No  Clopidogrel 75mg   1    No  Fenofibrate 145mg     1  No  Fluoxetine 20mg   1    No  Gabapentin 300mg       No  Janumet 50-1,000mg   1  1  No  Lantus Solostar      No  Lisinopril 20mg   1    No  Metoprolol Tartrate 25mg   1  1  No  Pantoprazole 40mg   1    No  Rosuvastatin 40mg     1  No  Trazodone 50mg      1 No  Vitamin B-12      No  Victoza      No   Due for refills on 09/11/20, confirmed delivery. F/U 1 Month

## 2020-09-03 NOTE — Patient Instructions (Signed)
    L2-3 and L5-S1 degenerative disc disease.

## 2020-09-08 ENCOUNTER — Ambulatory Visit: Payer: Medicaid Other | Admitting: Physical Therapy

## 2020-09-09 NOTE — Patient Outreach (Signed)
Spoke with patient, explained that Victoza and Janumet require a PA  Patient also requested Fish Oil OTC Delivery as well  Coordinate with [facility] for delivery 09/10/20

## 2020-09-10 ENCOUNTER — Telehealth: Payer: Self-pay

## 2020-09-10 ENCOUNTER — Ambulatory Visit: Payer: Medicaid Other | Admitting: Podiatry

## 2020-09-10 NOTE — Telephone Encounter (Signed)
PA for Victoza approved until 09/10/21.  Still waiting on Janumet PA decision.

## 2020-09-11 ENCOUNTER — Other Ambulatory Visit: Payer: Self-pay | Admitting: Physician Assistant

## 2020-09-11 NOTE — Telephone Encounter (Signed)
Requested medication (s) are due for refill today:   Yes  Requested medication (s) are on the active medication list:   Yes  Future visit scheduled:   Yes with Freeman Caldron   Last ordered: 08/14/2020  the kit  Returned because she is needing a refill of the lancets.   Also need how often she is to check glucose and amount needed.   Requested Prescriptions  Pending Prescriptions Disp Refills   Lancets Misc. (ACCU-CHEK FASTCLIX LANCET) KIT [Pharmacy Med Name: Accu-Chek FastClix Lancing Device kit]  0    Sig: USE AS DIRECTED      Endocrinology: Diabetes - Testing Supplies Passed - 09/11/2020  8:01 AM      Passed - Valid encounter within last 12 months    Recent Outpatient Visits           2 weeks ago Low back pain without sciatica, unspecified back pain laterality, unspecified chronicity   [place], [place], [place]   4 weeks ago Uncontrolled type 2 diabetes mellitus with complication, with long-term current use of insulin [facility])   [place], [place], [place]   6 months ago Type 2 diabetes mellitus with hyperglycemia, with long-term current use of insulin ([place])   [place], [place], MD   7 months ago Type 2 diabetes mellitus with hyperglycemia, with long-term current use of insulin ([place])   [place], Darrick Penna, MD   8 months ago Type 2 diabetes mellitus with hyperglycemia, with long-term current use of insulin ([place])   [place], [place], MD       Future Appointments             In 1 month Charlott Rakes, MD [place]

## 2020-09-12 ENCOUNTER — Ambulatory Visit: Payer: Medicaid Other | Admitting: Podiatry

## 2020-09-26 ENCOUNTER — Ambulatory Visit: Payer: Medicaid Other | Attending: Family Medicine | Admitting: Physical Therapy

## 2020-09-26 DIAGNOSIS — M5442 Lumbago with sciatica, left side: Secondary | ICD-10-CM | POA: Insufficient documentation

## 2020-09-26 DIAGNOSIS — G8929 Other chronic pain: Secondary | ICD-10-CM | POA: Insufficient documentation

## 2020-09-26 DIAGNOSIS — M545 Low back pain, unspecified: Secondary | ICD-10-CM | POA: Insufficient documentation

## 2020-09-26 DIAGNOSIS — M5441 Lumbago with sciatica, right side: Secondary | ICD-10-CM | POA: Insufficient documentation

## 2020-10-01 ENCOUNTER — Other Ambulatory Visit: Payer: Medicaid Other

## 2020-10-01 NOTE — Patient Outreach (Signed)
Medication BB B L EM BT PCP Refill Needed?  Aspirin 81 mg  1      Fish Oil 1200 mg  1  1    Fenofibrate 145 mg    1    Vitamin B-12 500 mcg  1      Metoprolol Tart 25 mg  1  1    Pantoprazole 40 mg  1      Trazodone 50 mg     1   Gabapentin 300 mg        Amlodipine 5 mg  1      Fluoxetine 20 mg  1      Clopidogrel 75 mg  1      Rosuvastatin 40 mg    1    Lisinopril 20 mg  1      Bupropion Sr 150 mg  1  1    Accu Check Guide Test Strips        Accu Check Fastclx Lancet Drum        Victoza Inj Lantus Janumet          Compliant on meds, delivery scheduled for 10/09/20

## 2020-10-07 ENCOUNTER — Other Ambulatory Visit: Payer: Self-pay | Admitting: Physician Assistant

## 2020-10-08 ENCOUNTER — Emergency Department (HOSPITAL_COMMUNITY)
Admission: EM | Admit: 2020-10-08 | Discharge: 2020-10-08 | Disposition: A | Discharge status: Home / Self Care | Payer: Medicaid Other | Attending: Emergency Medicine | Admitting: Emergency Medicine

## 2020-10-08 ENCOUNTER — Encounter (HOSPITAL_COMMUNITY): Payer: Self-pay | Admitting: Emergency Medicine

## 2020-10-08 ENCOUNTER — Other Ambulatory Visit: Payer: Self-pay

## 2020-10-08 ENCOUNTER — Emergency Department (HOSPITAL_COMMUNITY): Payer: Medicaid Other

## 2020-10-08 DIAGNOSIS — Z794 Long term (current) use of insulin: Secondary | ICD-10-CM | POA: Diagnosis not present

## 2020-10-08 DIAGNOSIS — Z7984 Long term (current) use of oral hypoglycemic drugs: Secondary | ICD-10-CM | POA: Diagnosis not present

## 2020-10-08 DIAGNOSIS — I10 Essential (primary) hypertension: Secondary | ICD-10-CM | POA: Insufficient documentation

## 2020-10-08 DIAGNOSIS — E119 Type 2 diabetes mellitus without complications: Secondary | ICD-10-CM | POA: Diagnosis not present

## 2020-10-08 DIAGNOSIS — Z7982 Long term (current) use of aspirin: Secondary | ICD-10-CM | POA: Insufficient documentation

## 2020-10-08 DIAGNOSIS — I2510 Atherosclerotic heart disease of native coronary artery without angina pectoris: Secondary | ICD-10-CM | POA: Insufficient documentation

## 2020-10-08 DIAGNOSIS — R42 Dizziness and giddiness: Secondary | ICD-10-CM | POA: Diagnosis not present

## 2020-10-08 DIAGNOSIS — Z79899 Other long term (current) drug therapy: Secondary | ICD-10-CM | POA: Insufficient documentation

## 2020-10-08 DIAGNOSIS — Z87891 Personal history of nicotine dependence: Secondary | ICD-10-CM | POA: Diagnosis not present

## 2020-10-08 LAB — COMPREHENSIVE METABOLIC PANEL
ALT: 15 U/L (ref 0–44)
AST: 15 U/L (ref 15–41)
Albumin: 3.4 g/dL — ABNORMAL LOW (ref 3.5–5.0)
Alkaline Phosphatase: 96 U/L (ref 38–126)
Anion gap: 9 (ref 5–15)
BUN: 10 mg/dL (ref 6–20)
CO2: 26 mmol/L (ref 22–32)
Calcium: 9.4 mg/dL (ref 8.9–10.3)
Chloride: 99 mmol/L (ref 98–111)
Creatinine, Ser: 0.56 mg/dL (ref 0.44–1.00)
GFR, Estimated: >60 mL/min (ref >60)
Glucose, Bld: 486 mg/dL — ABNORMAL HIGH (ref 70–99)
Potassium: 3.6 mmol/L (ref 3.5–5.1)
Sodium: 134 mmol/L — ABNORMAL LOW (ref 135–145)
Total Bilirubin: 0.5 mg/dL (ref 0.3–1.2)
Total Protein: 6.9 g/dL (ref 6.5–8.1)

## 2020-10-08 LAB — CBC WITH DIFFERENTIAL/PLATELET
Abs Immature Granulocytes: 0.01 10*3/uL (ref 0.00–0.07)
Basophils Absolute: 0.1 10*3/uL (ref 0.0–0.1)
Basophils Relative: 1 %
Eosinophils Absolute: 0.2 10*3/uL (ref 0.0–0.5)
Eosinophils Relative: 3 %
HCT: 41.7 % (ref 36.0–46.0)
Hemoglobin: 14.2 g/dL (ref 12.0–15.0)
Immature Granulocytes: 0 %
Lymphocytes Relative: 28 %
Lymphs Abs: 1.7 10*3/uL (ref 0.7–4.0)
MCH: 30.5 pg (ref 26.0–34.0)
MCHC: 34.1 g/dL (ref 30.0–36.0)
MCV: 89.5 fL (ref 80.0–100.0)
Monocytes Absolute: 0.5 10*3/uL (ref 0.1–1.0)
Monocytes Relative: 9 %
Neutro Abs: 3.4 10*3/uL (ref 1.7–7.7)
Neutrophils Relative %: 59 %
Platelets: 244 10*3/uL (ref 150–400)
RBC: 4.66 MIL/uL (ref 3.87–5.11)
RDW: 12.6 % (ref 11.5–15.5)
WBC: 5.8 10*3/uL (ref 4.0–10.5)
nRBC: 0 % (ref 0.0–0.2)

## 2020-10-08 MED ORDER — AMLODIPINE BESYLATE 5 MG PO TABS
10.0000 mg | ORAL_TABLET | Freq: Once | ORAL | Status: AC
  Administered 2020-10-08: 10 mg via ORAL
  Filled 2020-10-08: qty 2

## 2020-10-08 NOTE — Discharge Instructions (Addendum)
Call your primary care doctor or specialist as discussed in the next 2-3 days.   Return immediately back to the ER if:  Your symptoms worsen within the next 12-24 hours. You develop new symptoms such as new fevers, persistent vomiting, new pain, shortness of breath, or new weakness or numbness, or if you have any other concerns.  

## 2020-10-08 NOTE — ED Provider Notes (Signed)
Emergency Medicine Provider Triage Evaluation Note  Sarah Charles , a 60 y.o. female  was evaluated in triage.  Pt complains of not feeling well, equilibrium off x 2 days, not getting worse, not going away. Falls due to balance problems, no injuries from the fall, no LOC, no head injury.  Review of Systems  Positive: dizziness Negative: One side weakness, numbness  Physical Exam  BP (!) 154/88 (BP Location: Right Arm)   Pulse 92   Temp 98.4 F (36.9 C) (Oral)   Resp 16   SpO2 100%  Gen:   Awake, no distress   Resp:  Normal effort  MSK:   Moves extremities without difficulty  Other:    Medical Decision Making  Medically screening exam initiated at 12:25 PM.  Appropriate orders placed.  Sarah Charles was informed that the remainder of the evaluation will be completed by another provider, this initial triage assessment does not replace that evaluation, and the importance of remaining in the ED until their evaluation is complete.     Karlene, [place], PA-C 10/08/20 1239    Tilden Fossa, MD 10/09/20 [phone]

## 2020-10-08 NOTE — ED Triage Notes (Signed)
Pt here from home with c/o dizziiness and fall over the last 2 days , weakness all over

## 2020-10-08 NOTE — ED Provider Notes (Signed)
[facility] EMERGENCY DEPARTMENT Provider Note   CSN: [id number] Arrival date & time: 10/08/20  1221     History No chief complaint on file.   Sarah Charles is a 60 y.o. female.  Patient presents chief complaint of feeling dizzy lightheaded.  Symptoms ongoing for the past 4 to 5 days.  She fell 2 days ago due to this.  She denies any chest pain.  Denies headache chest pain abdominal pain fever vomiting cough or diarrhea.  She does complain of some nausea.  Was denies any new numbness or weakness.  Headache.        Past Medical History:  Diagnosis Date  . Arthritis   . Coronary artery calcification seen on CT scan    a. 07/2017 noted on CT abd.  . Depression   . GERD (gastroesophageal reflux disease)   . Hyperlipidemia   . Hypertension   . Obesity   . Sleep apnea    a. Does not use CPAP "cause i dont think I need it anymore".  . Tobacco abuse   . Type II diabetes mellitus [facility])     Patient Active Problem List   Diagnosis Date Noted  . CAD (coronary artery disease) 11/22/2017  . Chest pain, precordial 11/04/2017  . Acute chest pain   . Type 2 diabetes mellitus with hyperlipidemia ([place]) 10/29/2017  . Unstable angina ([place]) 10/29/2017  . Diabetic polyneuropathy associated with type 2 diabetes mellitus ([place]) 05/30/2017  . Mixed dyslipidemia 05/30/2017  . Depression 05/31/2016  . Hypertension 05/31/2016  . Uncontrolled type 2 diabetes mellitus with complication ([place]) 40/98/1191  . Uncontrolled type 2 diabetes mellitus with complication, with long-term current use of insulin ([place]) 04/20/2015  . Cellulitis of breast 12/29/2013  . Diabetes mellitus ([place]) 12/29/2013  . Cellulitis of female breast 12/29/2013  . Symptomatic menopausal or female climacteric states 04/08/2008  . OTHER SPECIFIED DISEASE OF NAIL 04/08/2008  . URINARY URGENCY 04/08/2008  . COUGH 04/02/2008  . HYPERTENSION, BENIGN ESSENTIAL 04/12/2007  . FATIGUE 04/12/2007  . OBESITY, NOS  07/21/2006  . Major depressive disorder, recurrent episode ([place]) 07/21/2006  . TOBACCO DEPENDENCE 07/21/2006  . GASTROESOPHAGEAL REFLUX, NO ESOPHAGITIS 07/21/2006  . INCONTINENCE, STRESS, FEMALE 07/21/2006    Past Surgical History:  Procedure Laterality Date  . CHOLECYSTECTOMY    . CORONARY STENT INTERVENTION N/A 11/04/2017   Procedure: CORONARY STENT INTERVENTION;  Surgeon: Martinique, Peter M, MD;  Location: [facility] CV LAB;  Service: Cardiovascular;  Laterality: N/A;  . INCISION AND DRAINAGE ABSCESS Left 12/22/2012   Procedure: INCISION AND DRAINAGE ABSCESS;  Surgeon: Jamesetta So, MD;  Location: [facility] ORS;  Service: General;  Laterality: Left;  . INTRAVASCULAR PRESSURE WIRE/FFR STUDY N/A 11/04/2017   Procedure: INTRAVASCULAR PRESSURE WIRE/FFR STUDY;  Surgeon: Martinique, Peter M, MD;  Location: [place] CV LAB;  Service: Cardiovascular;  Laterality: N/A;  . LEFT HEART CATH AND CORONARY ANGIOGRAPHY N/A 11/04/2017   Procedure: LEFT HEART CATH AND CORONARY ANGIOGRAPHY;  Surgeon: Martinique, Peter M, MD;  Location: [place] CV LAB;  Service: Cardiovascular;  Laterality: N/A;  . TONSILLECTOMY    . TUBAL LIGATION       OB History   No obstetric history on file.     Family History  Problem Relation Age of Onset  . Hypertension Mother   . CVA Mother   . COPD Mother   . Heart disease Mother   . Kidney Stones Mother   . Heart attack Mother  MI in late 50's  . Breast cancer Mother   . CAD Father   . Hypercholesterolemia Father   . Heart attack Father        Died @ 37 of MI  . Cancer Maternal Uncle   . Diabetes Maternal Grandmother   . Cancer Maternal Grandfather        lung cancer  . Diabetes Sister     Social History   Tobacco Use  . Smoking status: Former Smoker    Packs/day: 1.00    Years: 41.00    Pack years: 41.00    Types: Cigarettes    Quit date: 10/07/2018    Years since quitting: 2.0  . Smokeless tobacco: Never Used  Substance Use Topics  . Alcohol use: No   . Drug use: No    Home Medications Prior to Admission medications   Medication Sig Start Date End Date Taking? Authorizing Provider  Accu-Chek FastClix Lancets MISC AS DIRECTED TWICE DAILY 10/07/20   Charlott Rakes, MD  amLODipine (NORVASC) 5 MG tablet Take 1 tablet (5 mg total) by mouth daily. 08/14/20 11/12/20  [name], PA-C  aspirin EC 81 MG tablet Take 81 mg by mouth daily.    [provider]  Blood Glucose Monitoring Suppl (ACCU-CHEK GUIDE ME) w/Device KIT 1 each by Does not apply route 3 (three) times daily. 11/09/18   [name], PA-C  buPROPion (WELLBUTRIN SR) 150 MG 12 hr tablet Take 1 tablet (150 mg total) by mouth 2 (two) times daily. 08/14/20   [name], PA-C  cetirizine (ZYRTEC) 10 MG tablet TAKE 1 TABLET BY MOUTH EVERY DAY 08/02/19   Charlott Rakes, MD  clopidogrel (PLAVIX) 75 MG tablet Take 1 tablet (75 mg total) by mouth daily. 08/14/20   [name], PA-C  dicyclomine (BENTYL) 10 MG capsule Take 1 capsule by mouth daily. 05/26/18   [provider]  doxycycline (VIBRA-TABS) 100 MG tablet Take 1 tablet (100 mg total) by mouth 2 (two) times daily. 08/14/20   [name], PA-C  fenofibrate (TRICOR) 145 MG tablet Take 1 tablet (145 mg total) by mouth daily. 08/14/20   [name], PA-C  FLUoxetine (PROZAC) 20 MG capsule TAKE 1 CAPSULE BY MOUTH EVERY DAY 08/14/20   [name], PA-C  gabapentin (NEURONTIN) 300 MG capsule Take 1 capsule (300 mg total) by mouth 3 (three) times daily. 08/27/20   [name], PA-C  glucose blood (ACCU-CHEK GUIDE) test strip USE AS DIRECTED 08/14/20   [name], PA-C  insulin glargine (LANTUS SOLOSTAR) 100 UNIT/ML Solostar Pen Inject 60 Units into the skin at bedtime. 08/14/20   [name], PA-C  Insulin Pen Needle (B-D UF III MINI PEN NEEDLES) 31G X 5 MM MISC USE AS DIRECTED 08/14/20   [name], PA-C  Lancets Misc. (ACCU-CHEK FASTCLIX LANCET) KIT USE AS DIRECTED 09/11/20    Charlott Rakes, MD  liraglutide (VICTOZA) 18 MG/3ML SOPN Sq 1.57m once a day 08/14/20   MArgentina Donovan PA-C  lisinopril (ZESTRIL) 20 MG tablet Take 1 tablet (20 mg total) by mouth daily. 08/14/20   MArgentina Donovan PA-C  meloxicam (MOBIC) 7.5 MG tablet Take 1 tablet (7.5 mg total) by mouth daily. 02/04/20   NCharlott Rakes MD  methocarbamol (ROBAXIN) 500 MG tablet Take 1 tablet (500 mg total) by mouth 2 (two) times daily as needed (back pain). 08/14/20   MArgentina Donovan PA-C  metoCLOPramide (REGLAN) 10 MG tablet TAKE  1 TABLET BY MOUTH EVERY EIGHT HOURS AS NEEDED FOR NAUSEA OR VOMITING. 08/14/20   [name], PA-C  metoprolol tartrate (LOPRESSOR) 25 MG tablet Take 1 tablet (25 mg total) by mouth 2 (two) times daily. 08/14/20   [name], PA-C  Multiple Vitamin (MULTIVITAMIN) tablet Take 1 tablet by mouth daily.    [provider]  mupirocin ointment (BACTROBAN) 2 % Apply 1 application topically 2 (two) times daily. X 7 days at the first sign of skin infection 08/14/20   [name], PA-C  nitroGLYCERIN (NITROSTAT) 0.4 MG SL tablet Place 0.4 mg under the tongue every 5 (five) minutes as needed for chest pain.    [provider]  pantoprazole (PROTONIX) 40 MG tablet Take 1 tablet (40 mg total) by mouth daily. 08/14/20   [name], PA-C  rosuvastatin (CRESTOR) 40 MG tablet Take 1 tablet (40 mg total) by mouth daily. 08/14/20   [name], PA-C  sitaGLIPtin-metformin (JANUMET) 50-1000 MG tablet Take 1 tablet by mouth 2 (two) times daily with a meal. 08/14/20   McClung, Dionne Bucy, PA-C  traZODone (DESYREL) 50 MG tablet Take 1 tablet (50 mg total) by mouth at bedtime as needed. for sleep 08/14/20   [name], PA-C  vitamin B-12 (CYANOCOBALAMIN) 500 MCG tablet Take 1 tablet (500 mcg total) by mouth daily. 08/14/20   [name], PA-C  vitamin C (ASCORBIC ACID) 500 MG tablet Take 500 mg by mouth daily.    [provider]     Allergies    Cymbalta [duloxetine hcl]  Review of Systems   Review of Systems  Constitutional: Negative for fever.  HENT: Negative for ear pain.   Eyes: Negative for pain.  Respiratory: Negative for cough.   Cardiovascular: Negative for chest pain.  Gastrointestinal: Negative for abdominal pain.  Genitourinary: Negative for flank pain.  Musculoskeletal: Negative for back pain.  Skin: Negative for rash.  Neurological: Negative for headaches.    Physical Exam Updated Vital Signs BP (!) 155/101   Pulse 75   Temp 98.4 F (36.9 C) (Oral)   Resp 16   SpO2 98%   Physical Exam Constitutional:      General: She is not in acute distress.    Appearance: Normal appearance.  HENT:     Head: Normocephalic.     Nose: Nose normal.  Eyes:     Extraocular Movements: Extraocular movements intact.  Cardiovascular:     Rate and Rhythm: Normal rate.  Pulmonary:     Effort: Pulmonary effort is normal.  Musculoskeletal:        General: Normal range of motion.     Cervical back: Normal range of motion.  Neurological:     General: No focal deficit present.     Mental Status: She is alert and oriented to person, place, and time. Mental status is at baseline.     Cranial Nerves: No cranial nerve deficit.     Sensory: No sensory deficit.     Motor: No weakness.     Coordination: Coordination normal.     Gait: Gait normal.     ED Results / Procedures / Treatments   Labs (all labs ordered are listed, but only abnormal results are displayed) Labs Reviewed  COMPREHENSIVE METABOLIC PANEL - Abnormal; Notable for the following components:      Result Value   Sodium 134 (*)    Glucose, Bld 486 (*)    Albumin 3.4 (*)  All other components within normal limits  CBC WITH DIFFERENTIAL/PLATELET    EKG EKG Interpretation  Date/Time:  Wednesday Oct 08 2020 12:28:52 EDT Ventricular Rate:  91 PR Interval:  146 QRS Duration: 84 QT Interval:  362 QTC Calculation: 445 R  Axis:   55 Text Interpretation: Normal sinus rhythm Septal infarct , age undetermined Abnormal ECG Confirmed by Thamas Jaegers ([id number]) on 10/08/2020 3:52:26 PM   Radiology CT Head Wo Contrast  Result Date: 10/08/2020 CLINICAL DATA:  Dizziness EXAM: CT HEAD WITHOUT CONTRAST TECHNIQUE: Contiguous axial images were obtained from the base of the skull through the vertex without intravenous contrast. COMPARISON:  June 04, 2013 FINDINGS: Brain: There is mild cerebral atrophy with widening of the extra-axial spaces and ventricular dilatation. There are areas of decreased attenuation within the white matter tracts of the supratentorial brain, consistent with microvascular disease changes. Vascular: No hyperdense vessel or unexpected calcification. Skull: Normal. Negative for fracture or focal lesion. Sinuses/Orbits: Mild bilateral maxillary sinus and bilateral ethmoid sinus mucosal thickening is seen Other: None. IMPRESSION: 1. Generalized cerebral atrophy. 2. No acute intracranial abnormality. Electronically Signed   By: Virgina Norfolk M.D.   On: 10/08/2020 19:22    Procedures Procedures   Medications Ordered in ED Medications  amLODipine (NORVASC) tablet 10 mg (10 mg Oral Given 10/08/20 1824)    ED Course  I have reviewed the triage vital signs and the nursing notes.  Pertinent labs & imaging results that were available during my care of the patient were reviewed by me and considered in my medical decision making (see chart for details).    MDM Rules/Calculators/A&P                          No exam is benign.  She has a normal gait.  Normal finger-nose.  Labs are sent and glucose elevated 480, however the patient has been continually drinking from a a large cup of Coca[employer] just prior to her evaluation today.  No evidence of DKA on lab work.  CT imaging otherwise unremarkable as well for acute findings.  Recommending outpatient follow-up with her doctor within a week, recommend immediate  return for new numbness weakness or any additional concerns.    Final Clinical Impression(s) / ED Diagnoses Final diagnoses:  Dizziness    Rx / DC Orders ED Discharge Orders    None       Luna Fuse, MD 10/08/20 [phone]

## 2020-10-08 NOTE — ED Notes (Signed)
Denies dizziness at this time ?

## 2020-10-08 NOTE — ED Notes (Signed)
Pt reports she feels better, asking when she will be able to go home.

## 2020-10-09 ENCOUNTER — Telehealth: Payer: Self-pay | Admitting: *Deleted

## 2020-10-09 NOTE — Telephone Encounter (Signed)
Transition Care Management Follow-up Telephone Call  Date of discharge and from where: 10/08/2020 - [facility]  How have you been since you were released from the hospital? "The same"  Any questions or concerns? No  Items Reviewed:  Did the pt receive and understand the discharge instructions provided? Yes   Medications obtained and verified? Yes   Other? No   Any new allergies since your discharge? No   Dietary orders reviewed? No  Do you have support at home? Yes    Functional Questionnaire: (I = Independent and D = Dependent) ADLs: I  Bathing/Dressing- I  Meal Prep- I  Eating- I  Maintaining continence- I  Transferring/Ambulation- I  Managing Meds- I  Follow up appointments reviewed:   PCP Hospital f/u appt confirmed? Yes  Scheduled to see Dr. Alvis Lemmings on 10/28/2020 @ 1510.  Specialist Hospital f/u appt confirmed? No    Are transportation arrangements needed? No   If their condition worsens, is the pt aware to call PCP or go to the Emergency Dept.? Yes  Was the patient provided with contact information for the PCP's office or ED? Yes  Was to pt encouraged to call back with questions or concerns? Yes

## 2020-10-15 ENCOUNTER — Other Ambulatory Visit: Payer: Self-pay

## 2020-10-15 ENCOUNTER — Ambulatory Visit: Payer: Medicaid Other

## 2020-10-15 DIAGNOSIS — M5441 Lumbago with sciatica, right side: Secondary | ICD-10-CM | POA: Diagnosis present

## 2020-10-15 DIAGNOSIS — M545 Low back pain, unspecified: Secondary | ICD-10-CM

## 2020-10-15 DIAGNOSIS — M5442 Lumbago with sciatica, left side: Secondary | ICD-10-CM | POA: Diagnosis not present

## 2020-10-15 DIAGNOSIS — G8929 Other chronic pain: Secondary | ICD-10-CM | POA: Diagnosis present

## 2020-10-15 NOTE — Addendum Note (Signed)
Addended by: Sigmund Hazel on: 10/15/2020 05:18 PM   Modules accepted: Orders

## 2020-10-15 NOTE — Therapy (Signed)
[facility] [address] [place], [place], [address] Phone: [phone]   Fax:  [phone]  Physical Therapy Evaluation  Patient Details  Name: Sarah Charles MRN: [id number] Date of Birth: 1960/10/03 Referring Provider (PT): Lavada Mesi, MD   Encounter Date: 10/15/2020   PT End of Session - 10/15/20 1538    Visit Number 1    Number of Visits 17    Date for PT Re-Evaluation 12/10/20    Authorization Type [employer] MCD    Progress Note Due on Visit 17    PT Start Time 1500    PT Stop Time 1540    PT Time Calculation (min) 40 min    Activity Tolerance Patient tolerated treatment well    Behavior During Therapy [facility] for tasks assessed/performed           Past Medical History:  Diagnosis Date  . Arthritis   . Coronary artery calcification seen on CT scan    a. 07/2017 noted on CT abd.  . Depression   . GERD (gastroesophageal reflux disease)   . Hyperlipidemia   . Hypertension   . Obesity   . Sleep apnea    a. Does not use CPAP "cause i dont think I need it anymore".  . Tobacco abuse   . Type II diabetes mellitus (HCC)     Past Surgical History:  Procedure Laterality Date  . CHOLECYSTECTOMY    . CORONARY STENT INTERVENTION N/A 11/04/2017   Procedure: CORONARY STENT INTERVENTION;  Surgeon: Swaziland, Peter M, MD;  Location: [facility] INVASIVE CV LAB;  Service: Cardiovascular;  Laterality: N/A;  . INCISION AND DRAINAGE ABSCESS Left 12/22/2012   Procedure: INCISION AND DRAINAGE ABSCESS;  Surgeon: Dalia Heading, MD;  Location: [facility];  Service: General;  Laterality: Left;  . INTRAVASCULAR PRESSURE WIRE/FFR STUDY N/A 11/04/2017   Procedure: INTRAVASCULAR PRESSURE WIRE/FFR STUDY;  Surgeon: Swaziland, Peter M, MD;  Location: [facility] INVASIVE CV LAB;  Service: Cardiovascular;  Laterality: N/A;  . LEFT HEART CATH AND CORONARY ANGIOGRAPHY N/A 11/04/2017   Procedure: LEFT HEART CATH AND CORONARY ANGIOGRAPHY;  Surgeon: Swaziland, Peter M, MD;  Location: [facility]  INVASIVE CV LAB;  Service: Cardiovascular;  Laterality: N/A;  . TONSILLECTOMY    . TUBAL LIGATION      There were no vitals filed for this visit.    Subjective Assessment - 10/15/20 1507    Subjective Pt is a 60yo F who presents to clinic with primary c/o LBP with shooting pain into the BIL hips and thighs with occasional N/T for past 40 years. Prolonged standing increases her pain and gabapentin and rest helps to relieve pain. She also complains of constant BIL diabetic neuropathy in the feet. She is limited ADL's such as getting laundry out of the washing machine, reaching into cabinets, and cleaning the bathtub/ shower. The pt's husband recently passed away, so she is currently living with her mother and providing care for her.    Limitations Standing;Lifting;House hold activities    How long can you sit comfortably? unable    How long can you stand comfortably? 5 minutes    How long can you walk comfortably? 10 minutes    Patient Stated Goals To decrease back pain    Currently in Pain? Yes    Pain Score 8     Pain Location Back    Pain Orientation Posterior;Medial;Left;Right    Pain Descriptors / Indicators [place];Dull    Pain Type Chronic pain    Pain Radiating  Towards Anterior and post BIL thighs    Pain Onset More than a month ago    Pain Frequency Constant    Aggravating Factors  Standing, walking, sitting, lifting    Pain Relieving Factors Gabapentin    Effect of Pain on Daily Activities Pt is unable to accomplish daily tasks like doing laundry and cleaning without increased pain.    Multiple Pain Sites Yes    Pain Score 0    Pain Location Leg    Pain Orientation Right;Left;Anterior;Posterior    Pain Descriptors / Marine scientistndicators Shooting;Bill;Tingling;Numbness    Pain Type Chronic pain    Pain Onset More than a month ago    Pain Frequency Intermittent    Aggravating Factors  Sitting, standing, walking    Pain Relieving Factors Gabapentin              [facility] PT  Assessment - 10/15/20 0001      Assessment   Medical Diagnosis Chronic bilateral low back pain with bilateral sciatica (M54.42, M54.41, G89.29)    Referring Provider (PT) Hilts, Michael, MD    Hand Dominance Right    Next MD Visit Unknown    Prior Therapy No      Precautions   Precautions None      Restrictions   Weight Bearing Restrictions No      Balance Screen   Has the patient fallen in the past 6 months Yes    How many times? 2    Has the patient had a decrease in activity level because of a fear of falling?  Yes    Is the patient reluctant to leave their home because of a fear of falling?  No      Home Nurse, mental healthnvironment   Living Environment Private residence    Living Arrangements Other relatives   mother   Available Help at Discharge Family    Type of Home House    Home Access Ramped entrance    Home Layout One level    Home Equipment Walker - 2 wheels;Shower seat      Prior Function   Level of Independence Independent    Vocation Unemployed      Cognition   Overall Cognitive Status Within Functional Limits for tasks assessed      Observation/Other Assessments   Observations Increased lumbar lordosis      Single Leg Stance   Comments Unable to perform BIL      AROM   Lumbar Flexion 88 with pain    Lumbar Extension 15 with increased pain    Lumbar - Right Side Bend 13cm with pain    Lumbar - Left Side Bend 17cm with pain    Lumbar - Right Rotation 65 with pain    Lumbar - Left Rotation 85      Strength   Right Hip Flexion 3/5    Right Hip Extension 2/5    Right Hip ABduction 3/5    Left Hip Flexion 3/5    Left Hip Extension 3-/5    Left Hip ABduction 3/5      Flexibility   Hamstrings WNL   Hip flexors also WNL (-) Thomas test     Palpation   Palpation comment --   CPA's/ BIL UPA's of L1-L5 painful, mobility WNL     Slump test   Findings Negative    Side --   BIL     Straight Leg Raise   Findings Negative    Side  --  BIL     Thomas Test     Findings Negative    Side --   BIL     other   Findings --   Hamstring 90/90 negative BIL     Balance   Balance comment Pt unable to perform Romberg stance >5 seconds                      Objective measurements completed on examination: See above findings.       [facility] Adult PT Treatment/Exercise - 10/15/20 0001      Lumbar Exercises: Seated   Other Seated Lumbar Exercises Seated posterior pelvic tilt x10      Knee/Hip Exercises: Standing   Hip Flexion --   2x10 with GTB   Hip Abduction --   2x10 with GTB   Hip Extension --   2x10 with GTB                 PT Education - 10/15/20 1555    Education Details Pt educated on importance of arriving to appointments on time, as well as proper form for posterior pelvic tilts.    Person(s) Educated Patient    Methods Explanation;Demonstration;Tactile cues;Verbal cues    Comprehension Verbalized understanding;Tactile cues required;Verbal cues required;Returned demonstration            PT Short Term Goals - 10/15/20 1604      PT SHORT TERM GOAL #1   Title The pt will demonstrate understanding and adherence to her initial HEP in order to promote independence in the management of her symptoms.    Baseline HEP given at eval    Time 4    Period Weeks    Status New    Target Date 11/12/20      PT SHORT TERM GOAL #2   Title The pt will demonstrate hip extension, abduction, and flexion strength of at least 4/5 in order to allow for prolonged standing without increased pain.    Baseline Strength ranging from 2+/5 to 3/5 for all hip MMT    Time 4    Period Weeks    Status New    Target Date 11/12/20      PT SHORT TERM GOAL #3   Title The pt will demonstrate BIL lumbar rotation of 85 degrees or higher without pain in order to turn to pick up objects from the kitchen counter without limitation.    Baseline R lumbar rotation = 65 degrees with pain, L lumbar rotation = 85    Time 4    Period Weeks    Status New     Target Date 11/12/20      PT SHORT TERM GOAL #4   Title The pt will report ability to stand >30 minutes with 6/10 pain or less in order to perform household chores with less limitation.    Baseline Unable to stand >42min without 8/10 pain.    Time 4    Period Weeks    Status New    Target Date 11/12/20             PT Long Term Goals - 10/15/20 1608      PT LONG TERM GOAL #1   Title The pt will demonstrate improvement in Berg balance assessment of at least 5 points (assess Berg at next visit) in order to perform safe household ambulation.    Baseline Need assessed    Time 8    Period Weeks  Status New    Target Date 12/10/20      PT LONG TERM GOAL #2   Title The pt will demonstrate WNL lumbar mobility in all tested planes with pain no greater than 4/10 in order to do laundry with minimal limitation.    Baseline 8/10 pain, limited in all lumbar AROM    Time 8    Period Weeks    Status New    Target Date 12/10/20      PT LONG TERM GOAL #3   Title The pt will report ability to stand for 1 hour with 4/10 pain or less in order to wash dishes or cook without limitation due to pain.    Baseline Unable to stand >5 minutes without 8/10 pain    Time 8    Period Weeks    Status New    Target Date 12/10/20      PT LONG TERM GOAL #4   Title The pt will report ability to walk for 1 hour with 4/10 pain or less in order to work out for physical fitness with minimal limitation.    Baseline unable to walk >20min without 8/10 pain    Time 8    Period Weeks    Status New    Target Date 12/10/20                  Plan - 10/15/20 1555    Clinical Impression Statement The pt is a pleasant 60yo F who presents to PT with primary c/o banded lumbar pain with intermittent shooting pain into BIL hips and thighs. She demonstrates substantial weakness in BIL hips, as well as painful lumbar spine segmental mobility. She also exhibits painful and limited lumbar AROM in all planes.  Additionally, she demonstrates poor standing balance, unable to perform single leg stance or Romberg stance without lateral LOB. The pt has WNL BIL hamstring and hip flexor extensibility, although she does exhibit increased lumbar lordosis in standing and sitting. Weak hip and core musculature are likely contributory to the pt's symptoms. She will benefit from skilled PT to address her primary impairments and help her return to her prior level of function without pain and disability.    Personal Factors and Comorbidities Fitness;Comorbidity 3+    Examination-Activity Limitations Bend;Sit;Stand;Carry;Locomotion Level    Examination-Participation Restrictions Laundry;Cleaning    Stability/Clinical Decision Making Stable/Uncomplicated    Clinical Decision Making Low    Rehab Potential Fair    PT Frequency 2x / week    PT Duration 8 weeks    PT Treatment/Interventions ADLs/Self Care Home Management;Moist Heat;Cryotherapy;DME Instruction;Gait training;Functional mobility training;Therapeutic activities;Therapeutic exercise;Balance training;Neuromuscular re-education;Patient/family education;Manual techniques;Taping;Dry needling;Passive range of motion;Joint Manipulations;Spinal Manipulations    PT Next Visit Plan Assess Berg balance assessment, progress hip/ core strengthening and balance exercises    PT Home Exercise Plan [id number]    Consulted and Agree with Plan of Care Patient           Patient will benefit from skilled therapeutic intervention in order to improve the following deficits and impairments:  Decreased mobility,Decreased range of motion,Decreased strength,Hypomobility,Pain  Visit Diagnosis: Acute back pain with sciatica, left  Acute back pain with sciatica, right  Chronic midline low back pain, unspecified whether sciatica present     Problem List Patient Active Problem List   Diagnosis Date Noted  . CAD (coronary artery disease) 11/22/2017  . Chest pain, precordial  11/04/2017  . Acute chest pain   . Type 2 diabetes mellitus with hyperlipidemia (  HCC) 10/29/2017  . Unstable angina (HCC) 10/29/2017  . Diabetic polyneuropathy associated with type 2 diabetes mellitus (HCC) 05/30/2017  . Mixed dyslipidemia 05/30/2017  . Depression 05/31/2016  . Hypertension 05/31/2016  . Uncontrolled type 2 diabetes mellitus with complication (HCC) 04/08/2016  . Uncontrolled type 2 diabetes mellitus with complication, with long-term current use of insulin (HCC) 04/20/2015  . Cellulitis of breast 12/29/2013  . Diabetes mellitus (HCC) 12/29/2013  . Cellulitis of female breast 12/29/2013  . Symptomatic menopausal or female climacteric states 04/08/2008  . OTHER SPECIFIED DISEASE OF NAIL 04/08/2008  . URINARY URGENCY 04/08/2008  . COUGH 04/02/2008  . HYPERTENSION, BENIGN ESSENTIAL 04/12/2007  . FATIGUE 04/12/2007  . OBESITY, NOS 07/21/2006  . Major depressive disorder, recurrent episode (HCC) 07/21/2006  . TOBACCO DEPENDENCE 07/21/2006  . GASTROESOPHAGEAL REFLUX, NO ESOPHAGITIS 07/21/2006  . INCONTINENCE, STRESS, FEMALE 07/21/2006    Carmelina Dane, PT, DPT 10/15/20 4:18 PM   [facility] [address] [place], [place], [address] Phone: [phone]   Fax:  [phone]  Name: Sarah Charles MRN: [id number] Date of Birth: [birth year]   Check all possible CPT codes: 97110- Therapeutic Exercise, [phone]- Neuro Re-education, [phone] - Gait Training, [phone] - Manual Therapy, 97530 - Therapeutic Activities, 97535 - Self Care and [phone] - Mechanical traction

## 2020-10-15 NOTE — Patient Instructions (Signed)
   [id number]

## 2020-10-27 ENCOUNTER — Other Ambulatory Visit: Payer: Self-pay | Admitting: Physician Assistant

## 2020-10-27 DIAGNOSIS — E1142 Type 2 diabetes mellitus with diabetic polyneuropathy: Secondary | ICD-10-CM

## 2020-10-28 ENCOUNTER — Other Ambulatory Visit: Payer: Self-pay

## 2020-10-28 ENCOUNTER — Ambulatory Visit: Payer: Medicaid Other | Attending: Family Medicine | Admitting: Family Medicine

## 2020-10-28 ENCOUNTER — Encounter: Payer: Self-pay | Admitting: Family Medicine

## 2020-10-28 DIAGNOSIS — E1165 Type 2 diabetes mellitus with hyperglycemia: Secondary | ICD-10-CM

## 2020-10-28 DIAGNOSIS — E1159 Type 2 diabetes mellitus with other circulatory complications: Secondary | ICD-10-CM

## 2020-10-28 DIAGNOSIS — E785 Hyperlipidemia, unspecified: Secondary | ICD-10-CM

## 2020-10-28 DIAGNOSIS — R5383 Other fatigue: Secondary | ICD-10-CM | POA: Diagnosis not present

## 2020-10-28 DIAGNOSIS — E118 Type 2 diabetes mellitus with unspecified complications: Secondary | ICD-10-CM | POA: Diagnosis not present

## 2020-10-28 DIAGNOSIS — I2510 Atherosclerotic heart disease of native coronary artery without angina pectoris: Secondary | ICD-10-CM | POA: Diagnosis not present

## 2020-10-28 DIAGNOSIS — E1169 Type 2 diabetes mellitus with other specified complication: Secondary | ICD-10-CM | POA: Diagnosis not present

## 2020-10-28 DIAGNOSIS — I152 Hypertension secondary to endocrine disorders: Secondary | ICD-10-CM

## 2020-10-28 DIAGNOSIS — Z794 Long term (current) use of insulin: Secondary | ICD-10-CM | POA: Diagnosis not present

## 2020-10-28 DIAGNOSIS — IMO0002 Reserved for concepts with insufficient information to code with codable children: Secondary | ICD-10-CM

## 2020-10-28 NOTE — Progress Notes (Signed)
Virtual Visit via Telephone Note  I connected with Sarah Charles, on 10/28/2020 at 3:22 PM by telephone due to the COVID-19 pandemic and verified that I am speaking with the correct person using two identifiers.   Consent: I discussed the limitations, risks, security and privacy concerns of performing an evaluation and management service by telephone and the availability of in person appointments. I also discussed with the patient that there may be a patient responsible charge related to this service. The patient expressed understanding and agreed to proceed.   Location of Patient: Home  Location of Provider: Clinic   Persons participating in Telemedicine visit: [name] Dr. Margarita Rana     History of Present Illness: Sarah Charles a 60 year old female with a history of type 2 diabetes mellitus (A1c 8.2),noncompliance,hypertension, hyperlipidemia, GERD, CAD(s/p DES)who presents today fora follow-up visit.  Her blood sugars have been around 331- this morning fasting and prior to that ranged from 187-287.  She has been on 50 units of Lantus rather than 60 units which appears on her med list.  Endorses drinking lots of sodas and noncompliance with diabetic diet.  She does not exercise regularly. She complains of fatigue x1 month.  Denies presence of chest pain, dyspnea, palpitations.  Compliant with her antihypertensive and her statin. Past Medical History:  Diagnosis Date  . Arthritis   . Coronary artery calcification seen on CT scan    a. 07/2017 noted on CT abd.  . Depression   . GERD (gastroesophageal reflux disease)   . Hyperlipidemia   . Hypertension   . Obesity   . Sleep apnea    a. Does not use CPAP "cause i dont think I need it anymore".  . Tobacco abuse   . Type II diabetes mellitus (HCC)    Allergies  Allergen Reactions  . Cymbalta [Duloxetine Hcl] Nausea Only    Nausea, lack of therapeutic effect    Current Outpatient Medications on File Prior to Visit   Medication Sig Dispense Refill  . Accu-Chek FastClix Lancets MISC AS DIRECTED TWICE DAILY 102 each 0  . amLODipine (NORVASC) 5 MG tablet Take 1 tablet (5 mg total) by mouth daily. 180 tablet 1  . aspirin EC 81 MG tablet Take 81 mg by mouth daily.    . Blood Glucose Monitoring Suppl (ACCU-CHEK GUIDE ME) w/Device KIT 1 each by Does not apply route 3 (three) times daily. 1 kit 0  . buPROPion (WELLBUTRIN SR) 150 MG 12 hr tablet Take 1 tablet (150 mg total) by mouth 2 (two) times daily. 180 tablet 1  . cetirizine (ZYRTEC) 10 MG tablet TAKE 1 TABLET BY MOUTH EVERY DAY 30 tablet 2  . clopidogrel (PLAVIX) 75 MG tablet Take 1 tablet (75 mg total) by mouth daily. 90 tablet 1  . dicyclomine (BENTYL) 10 MG capsule Take 1 capsule by mouth daily.    Marland Kitchen doxycycline (VIBRA-TABS) 100 MG tablet Take 1 tablet (100 mg total) by mouth 2 (two) times daily. 20 tablet 0  . fenofibrate (TRICOR) 145 MG tablet Take 1 tablet (145 mg total) by mouth daily. 90 tablet 3  . FLUoxetine (PROZAC) 20 MG capsule TAKE 1 CAPSULE BY MOUTH EVERY DAY 90 capsule 1  . gabapentin (NEURONTIN) 300 MG capsule TAKE ONE CAPSULE BY MOUTH twice daily 60 capsule 2  . glucose blood (ACCU-CHEK GUIDE) test strip USE AS DIRECTED 100 strip 2  . insulin glargine (LANTUS SOLOSTAR) 100 UNIT/ML Solostar Pen Inject 60 Units into the skin at bedtime. 30 mL  3  . Insulin Pen Needle (B-D UF III MINI PEN NEEDLES) 31G X 5 MM MISC USE AS DIRECTED 100 each 3  . Lancets Misc. (ACCU-CHEK FASTCLIX LANCET) KIT USE AS DIRECTED 1 kit 0  . liraglutide (VICTOZA) 18 MG/3ML SOPN Sq 1.44m once a day 6 mL 3  . lisinopril (ZESTRIL) 20 MG tablet Take 1 tablet (20 mg total) by mouth daily. 90 tablet 1  . meloxicam (MOBIC) 7.5 MG tablet Take 1 tablet (7.5 mg total) by mouth daily. 10 tablet 0  . methocarbamol (ROBAXIN) 500 MG tablet Take 1 tablet (500 mg total) by mouth 2 (two) times daily as needed (back pain). 30 tablet 0  . metoCLOPramide (REGLAN) 10 MG tablet TAKE 1 TABLET BY  MOUTH EVERY EIGHT HOURS AS NEEDED FOR NAUSEA OR VOMITING. 40 tablet 0  . metoprolol tartrate (LOPRESSOR) 25 MG tablet Take 1 tablet (25 mg total) by mouth 2 (two) times daily. 180 tablet 3  . Multiple Vitamin (MULTIVITAMIN) tablet Take 1 tablet by mouth daily.    . mupirocin ointment (BACTROBAN) 2 % Apply 1 application topically 2 (two) times daily. X 7 days at the first sign of skin infection 22 g 0  . nitroGLYCERIN (NITROSTAT) 0.4 MG SL tablet Place 0.4 mg under the tongue every 5 (five) minutes as needed for chest pain.    . pantoprazole (PROTONIX) 40 MG tablet Take 1 tablet (40 mg total) by mouth daily. 90 tablet 2  . rosuvastatin (CRESTOR) 40 MG tablet Take 1 tablet (40 mg total) by mouth daily. 90 tablet 1  . sitaGLIPtin-metformin (JANUMET) 50-1000 MG tablet Take 1 tablet by mouth 2 (two) times daily with a meal. 180 tablet 1  . traZODone (DESYREL) 50 MG tablet Take 1 tablet (50 mg total) by mouth at bedtime as needed. for sleep 30 tablet 2  . vitamin B-12 (CYANOCOBALAMIN) 500 MCG tablet Take 1 tablet (500 mcg total) by mouth daily. 30 tablet 3  . vitamin C (ASCORBIC ACID) 500 MG tablet Take 500 mg by mouth daily.     No current facility-administered medications on file prior to visit.    ROS: See HPI  Observations/Objective: Awake, alert, and 2x3 Not in acute distress  Lab Results  Component Value Date   HGBA1C 8.2 (A) 08/14/2020    CMP Latest Ref Rng & Units 10/08/2020 08/14/2020 06/01/2020  Glucose 70 - 99 mg/dL 486(H) 239(H) 214(H)  BUN 6 - 20 mg/dL _0 Creatinine 0.44 - 1.00 mg/dL 0.56 0.50(L) 0.61  Sodium 135 - 145 mmol/L 134(L) 137 139  Potassium 3.5 - 5.1 mmol/L 3.6 4.4 4.1  Chloride 98 - 111 mmol/L 99 97 101  CO2 22 - 32 mmol/L _1 Calcium 8.9 - 10.3 mg/dL 9.4 9.5 9.3  Total Protein 6.5 - 8.1 g/dL 6.9 6.4 -  Total Bilirubin 0.3 - 1.2 mg/dL 0.5 <0.2 -  Alkaline Phos 38 - 126 U/L 96 124(H) -  AST 15 - 41 U/L 15 12 -  ALT 0 - 44 U/L 15 10 -    Lipid  Panel     Component Value Date/Time   CHOL 208 (H) 08/14/2020 1124   TRIG 187 (H) 08/14/2020 1124   HDL 53 08/14/2020 1124   CHOLHDL 3.9 08/14/2020 1124   CHOLHDL 9.7 (H) 07/23/2016 1638   VLDL NOT CALC 07/23/2016 1638   LDLCALC 122 (H) 08/14/2020 1124   LABVLDL 33 08/14/2020 1124    Assessment and Plan: 1. Uncontrolled type 2  diabetes mellitus with complication, with long-term current use of insulin ([place]) Uncontrolled with A1c of 8.2 likely due to the fact that she is not administering the correct dose of her Lantus and has been advised to increase to 60 units.  Educated on management of hypoglycemia and if blood sugars drop below 88 she will need to decrease her Lantus by 2 units A1c is due at next in person visit which comes up in 2 weeks Counseled on Diabetic diet, my plate method, 007 minutes of moderate intensity exercise/week Blood sugar logs with fasting goals of 80-120 mg/dl, random of less than 180 and in the event of sugars less than 60 mg/dl or greater than 400 mg/dl encouraged to notify the clinic. Advised on the need for annual eye exams, annual foot exams, Pneumonia vaccine.  2. Other fatigue Will need to work-up possible etiologies Labs have been ordered which will be done at her upcoming in person visit - T4, free; Future - TSH; Future - CBC with Differential/Platelet; Future - VITAMIN D 25 Hydroxy (Vit-D Deficiency, Fractures); Future  3. Coronary artery disease involving native coronary artery of native heart without angina pectoris Asymptomatic Risk factor modification Continue statin, Plavix  4. Hyperlipidemia associated with type 2 diabetes mellitus ([place]) Uncontrolled Currently on high-dose statin Low-cholesterol diet   5. Hypertension associated with diabetes ([place]) Controlled from last visit Continue current regimen Counseled on blood pressure goal of less than 130/80, low-sodium, DASH diet, medication compliance, 150 minutes of moderate intensity  exercise per week. Discussed medication compliance, adverse effects.   Follow Up Instructions: Keep previously scheduled upcoming appointment.   I discussed the assessment and treatment plan with the patient. The patient was provided an opportunity to ask questions and all were answered. The patient agreed with the plan and demonstrated an understanding of the instructions.   The patient was advised to call back or seek an in-person evaluation if the symptoms worsen or if the condition fails to improve as anticipated.     I provided 16 minutes total of non-face-to-face time during this encounter.   Charlott Rakes, MD, FAAFP. [facility] [place], [place]   10/28/2020, 3:22 PM

## 2020-10-29 ENCOUNTER — Other Ambulatory Visit: Payer: Self-pay | Admitting: Obstetrics and Gynecology

## 2020-10-29 ENCOUNTER — Other Ambulatory Visit: Payer: Self-pay

## 2020-10-29 NOTE — Patient Outreach (Signed)
Medicaid Managed Care   Nurse Care Manager Note  10/29/2020 Name:  Sarah Charles MRN:  [id number] DOB:  1961/04/21  Sarah Charles is an 60 y.o. year old female who is a primary patient of Sarah Rakes, MD.  The [facility] Managed Care Coordination team was consulted for assistance with:    chronic healthcare management needs.  Sarah Charles was given information about Medicaid Managed Care Coordination team services today. Sarah Charles agreed to services and verbal consent obtained.  Engaged with patient by telephone for follow up visit in response to provider referral for case management and/or care coordination services.   Assessments/Interventions:  Review of past medical history, allergies, medications, health status, including review of consultants reports, laboratory and other test data, was performed as part of comprehensive evaluation and provision of chronic care management services.  SDOH (Social Determinants of Health) assessments and interventions performed:   Care Plan  Allergies  Allergen Reactions  . Cymbalta [Duloxetine Hcl] Nausea Only    Nausea, lack of therapeutic effect    Medications Reviewed Today    Reviewed by Sarah Medicus, RN (Registered Nurse) on 10/29/20 at 58  Med List Status: <None>  Medication Order Taking? Sig Documenting Provider Last Dose Status Informant  Accu-Chek FastClix Lancets MISC [id number] No AS DIRECTED TWICE DAILY Sarah Rakes, MD Taking Active   amLODipine (NORVASC) 5 MG tablet [id number] No Take 1 tablet (5 mg total) by mouth daily. [name], [place] Taking Active   aspirin EC 81 MG tablet [id number] No Take 81 mg by mouth daily. [provider] Taking Active   Blood Glucose Monitoring Suppl (ACCU-CHEK GUIDE ME) w/Device KIT [id number] No 1 each by Does not apply route 3 (three) times daily. Freeman Caldron M, PA-C Taking Active   buPROPion [facility] SR) 150 MG 12 hr tablet [id number] No Take 1 tablet (150 mg total) by mouth 2  (two) times daily. Freeman Caldron M, PA-C Taking Active   cetirizine (ZYRTEC) 10 MG tablet [id number] No TAKE 1 TABLET BY MOUTH EVERY DAY Sarah Rakes, MD Taking Active   clopidogrel (PLAVIX) 75 MG tablet [id number] No Take 1 tablet (75 mg total) by mouth daily. Freeman Caldron M, PA-C Taking Active   dicyclomine (BENTYL) 10 MG capsule [id number] No Take 1 capsule by mouth daily. [provider] Taking Active   doxycycline (VIBRA-TABS) 100 MG tablet [id number] No Take 1 tablet (100 mg total) by mouth 2 (two) times daily. Freeman Caldron M, PA-C Taking Active   fenofibrate (TRICOR) 145 MG tablet [id number] No Take 1 tablet (145 mg total) by mouth daily. [name], [place] Taking Active   FLUoxetine (PROZAC) 20 MG capsule [id number] No TAKE 1 CAPSULE BY MOUTH EVERY DAY [name], PA-C Taking Active   gabapentin (NEURONTIN) 300 MG capsule [id number]  TAKE ONE CAPSULE BY MOUTH twice daily Sarah Rakes, MD  Active   glucose blood (ACCU-CHEK GUIDE) test strip [id number] No USE AS DIRECTED Sarah Solo, Dionne Bucy, PA-C Taking Active   insulin glargine (LANTUS SOLOSTAR) 100 UNIT/ML Solostar Pen [id number] No Inject 60 Units into the skin at bedtime. [name], PA-C Taking Active   Insulin Pen Needle (B-D UF III MINI PEN NEEDLES) 31G X 5 MM MISC [id number] No USE AS DIRECTED Sarah Solo Dionne Bucy, PA-C Taking Active   Lancets Misc. (ACCU-CHEK FASTCLIX LANCET) KIT [id number] No USE AS DIRECTED Sarah Rakes, MD Taking Active   liraglutide (VICTOZA) 18 MG/3ML SOPN [id number] No Sq 1.41m once a day MArgentina Donovan [place]  Taking Active   lisinopril (ZESTRIL) 20 MG tablet [id number] No Take 1 tablet (20 mg total) by mouth daily. Freeman Caldron M, PA-C Taking Active   meloxicam (MOBIC) 7.5 MG tablet [id number] No Take 1 tablet (7.5 mg total) by mouth daily. Sarah Rakes, MD Taking Active   methocarbamol (ROBAXIN) 500 MG tablet [id number] No Take 1 tablet (500 mg total) by mouth 2 (two) times daily  as needed (back pain). Freeman Caldron M, PA-C Taking Active   metoCLOPramide (REGLAN) 10 MG tablet [id number] No TAKE 1 TABLET BY MOUTH EVERY EIGHT HOURS AS NEEDED FOR NAUSEA OR VOMITING. [name], PA-C Taking Active   metoprolol tartrate (LOPRESSOR) 25 MG tablet [id number] No Take 1 tablet (25 mg total) by mouth 2 (two) times daily. [name], [place] Taking Active   Multiple Vitamin (MULTIVITAMIN) tablet [id number] No Take 1 tablet by mouth daily. [provider] Taking Active   mupirocin ointment (BACTROBAN) 2 % [id number] No Apply 1 application topically 2 (two) times daily. X 7 days at the first sign of skin infection [name], PA-C Taking Active   nitroGLYCERIN (NITROSTAT) 0.4 MG SL tablet [id number] No Place 0.4 mg under the tongue every 5 (five) minutes as needed for chest pain. [provider] Taking Active   pantoprazole (PROTONIX) 40 MG tablet [id number] No Take 1 tablet (40 mg total) by mouth daily. Freeman Caldron M, PA-C Taking Active   rosuvastatin (CRESTOR) 40 MG tablet [id number] No Take 1 tablet (40 mg total) by mouth daily. Freeman Caldron M, PA-C Taking Active   sitaGLIPtin-metformin (JANUMET) 50-1000 MG tablet [id number] No Take 1 tablet by mouth 2 (two) times daily with a meal. [name], PA-C Taking Active   traZODone (DESYREL) 50 MG tablet [id number] No Take 1 tablet (50 mg total) by mouth at bedtime as needed. for sleep [name], PA-C Taking Active   vitamin B-12 (CYANOCOBALAMIN) 500 MCG tablet [id number] No Take 1 tablet (500 mcg total) by mouth daily. [name], PA-C Taking Active   vitamin C (ASCORBIC ACID) 500 MG tablet [id number] No Take 500 mg by mouth daily. [provider] Taking Active   Med List Note Loetta Rough 12/22/14 1915): Patient receives medications from the [facility] [place]          Patient Active Problem List   Diagnosis Date Noted  . CAD (coronary artery disease)  11/22/2017  . Chest pain, precordial 11/04/2017  . Acute chest pain   . Type 2 diabetes mellitus with hyperlipidemia ([place]) 10/29/2017  . Unstable angina ([place]) 10/29/2017  . Diabetic polyneuropathy associated with type 2 diabetes mellitus ([place]) 05/30/2017  . Mixed dyslipidemia 05/30/2017  . Depression 05/31/2016  . Hypertension 05/31/2016  . Uncontrolled type 2 diabetes mellitus with complication ([place]) 23/30/0762  . Uncontrolled type 2 diabetes mellitus with complication, with long-term current use of insulin ([place]) 04/20/2015  . Cellulitis of breast 12/29/2013  . Diabetes mellitus ([place]) 12/29/2013  . Cellulitis of female breast 12/29/2013  . Symptomatic menopausal or female climacteric states 04/08/2008  . OTHER SPECIFIED DISEASE OF NAIL 04/08/2008  . URINARY URGENCY 04/08/2008  . COUGH 04/02/2008  . HYPERTENSION, BENIGN ESSENTIAL 04/12/2007  . FATIGUE 04/12/2007  . OBESITY, NOS 07/21/2006  . Major depressive disorder, recurrent episode ([place]) 07/21/2006  . TOBACCO DEPENDENCE 07/21/2006  . GASTROESOPHAGEAL REFLUX, NO ESOPHAGITIS 07/21/2006  . INCONTINENCE, STRESS, FEMALE 07/21/2006    Conditions to be addressed/monitored per PCP order:  chronic healthcare management needs,  DM2, HTN, GERD, CAD, arthritis, sleep apnea, tobacco use  Care Plan : General Plan of Care (Adult)  Updates made by Sarah Medicus, RN since 10/29/2020 12:00 AM    Problem: Health Promotion or Disease Self-Management (General Plan of Care)   Priority: High  Onset Date: 06/10/2020    Long-Range Goal: Self-Management Plan Developed   Start Date: 06/10/2020  Expected End Date: 01/29/2021  Recent Progress: Not on track  Priority: High  Note:   Current Barriers:  . Chronic Disease Management support and education needs. . Patient needs blood pressure monitoring kit, CPAP, dietary referral as well as GI, Ophthamology, Cardiology, and Nephrology  Nurse Case Manager Clinical Goal(s):  Marland Kitchen Over the next 30 days, patient  will attend all scheduled medical appointments: . Over the next 30 days, patient will meet with dietician. . Over the next 30 days, patient will work with CM team pharmacist to review medications. Marland Kitchen Update 07/11/20: Patient met with Pharmacist 07/01/20 and continues to follow.  Interventions:  . Inter-disciplinary care team collaboration (see longitudinal plan of care) . Reviewed medications with patient. Nash Dimmer with pharmacy regarding medication review. . Discussed plans with patient for ongoing care management follow up and provided patient with direct contact information for care management team . Reviewed scheduled/upcoming provider appointments. . Pharmacy referral for medication review. Nash Dimmer with PCP for dietary referral. . RNCM will follow up with PCP regarding blood pressure monitoring kit and CPAP.   Marland Kitchen Update 10/29/20:  Patient states PCP is ordering blood pressure monitoring kit.  Patient to have sleep study.  Patient Goals/Self-Care Activities Over the next 30 days, patient will:  -Self administers medications as prescribed Attends all scheduled provider appointments Calls pharmacy for medication refills Calls provider office for new concerns or questions  Follow Up Plan: The Managed Medicaid care management team will reach out to the patient again over the next 30 days.  The patient has been provided with contact information for the Managed Medicaid care management team and has been advised to call with any health related questions or concerns.     Follow Up:  Patient agrees to Care Plan and Follow-up.  Plan: The Managed Medicaid care management team will reach out to the patient again over the next 30 days. and The patient has been provided with contact information for the Managed Medicaid care management team and has been advised to call with any health related questions or concerns.  Date/time of next scheduled RN care management/care coordination outreach:   11/19/20 at 1030.

## 2020-10-29 NOTE — Patient Instructions (Signed)
Visit Information  [name] was given information about Medicaid Managed Care team care coordination services as a part of their [place] Medicaid benefit. Sarah Charles verbally consented to engagement with the [facility] Managed Care team.   For questions related to your [facility], please call: [phone] or visit the homepage here: [email or web address]  If you would like to schedule transportation through your [facility], please call the following number at least 2 days in advance of your appointment: [phone].   Call the [place] at [phone], at any time, 24 hours a day, 7 days a week. If you are in danger or need immediate medical attention call 911.  [name] - following are the goals we discussed in your visit today:  Goals Addressed            This Visit's Progress   . Protect My Health       Timeframe:  Long-Range Goal Priority:  High Start Date:      06/10/20                       Expected End Date:    01/29/21                  Follow Up Date 11/28/20   - schedule appointment for vaccines needed due to my age or health - schedule recommended health tests (blood work, mammogram, colonoscopy, pap test) - schedule and keep appointment for annual check-up   Update 10/29/20:  Patient seen by PCP and recommended follow up appointments with GI for colonoscopy, Opthamologist for eye exam, Cardiologist and Nephrologist-awaiting referrals.   Why is this important?    Screening tests can find diseases early when they are easier to treat.   Your doctor or nurse will talk with you about which tests are important for you.   Getting shots for common diseases like the flu and shingles will help prevent them.      Patient verbalizes understanding of instructions provided today.   The Managed Medicaid care management team will reach  out to the patient again over the next 30 days.  The patient has been provided with contact information for the Managed Medicaid care management team and has been advised to call with any health related questions or concerns.   Sarah Raider RN, BSN   [facility] - Managed Medicaid High Risk [phone].  Following is a copy of your plan of care:  Patient Care Plan: Wellness (Adult)    Problem Identified: Health Literacy (Wellness)   Priority: High  Onset Date: 06/10/2020    Patient Care Plan: General Plan of Care (Adult)    Problem Identified: Health Promotion or Disease Self-Management (General Plan of Care)   Priority: High  Onset Date: 06/10/2020    Long-Range Goal: Self-Management Plan Developed   Start Date: 06/10/2020  Expected End Date: 01/29/2021  Recent Progress: Not on track  Priority: High  Note:   Current Barriers:  . Chronic Disease Management support and education needs. . Patient needs blood pressure monitoring kit, CPAP, dietary referral as well as GI, Ophthamology, Cardiology, and Nephrology  Nurse Case Manager Clinical Goal(s):  Marland Kitchen Over the next 30 days, patient will attend all scheduled medical appointments: . Over the next 30 days, patient will meet with dietician. . Over the next 30 days, patient will work with CM team pharmacist to review medications. Marland Kitchen  Update 07/11/20: Patient met with Pharmacist 07/01/20 and continues to follow.  Interventions:  . Inter-disciplinary care team collaboration (see longitudinal plan of care) . Reviewed medications with patient. Sarah Charles with pharmacy regarding medication review. . Discussed plans with patient for ongoing care management follow up and provided patient with direct contact information for care management team . Reviewed scheduled/upcoming provider appointments. . Pharmacy referral for medication review. Sarah Charles with PCP for dietary referral. . RNCM  will follow up with PCP regarding blood pressure monitoring kit and CPAP.   Marland Kitchen Update 10/29/20:  Patient states PCP is ordering blood pressure monitoring kit.  Patient to have sleep study.  Patient Goals/Self-Care Activities Over the next 30 days, patient will:  -Self administers medications as prescribed Attends all scheduled provider appointments Calls pharmacy for medication refills Calls provider office for new concerns or questions  Follow Up Plan: The Managed Medicaid care management team will reach out to the patient again over the next 30 days.  The patient has been provided with contact information for the Managed Medicaid care management team and has been advised to call with any health related questions or concerns.

## 2020-11-03 ENCOUNTER — Other Ambulatory Visit: Payer: Medicaid Other

## 2020-11-03 NOTE — Patient Outreach (Signed)
Due for delivery 11/07/20 but patient states has enough left, push back call 2 weeks

## 2020-11-04 ENCOUNTER — Telehealth: Payer: Self-pay | Admitting: Family Medicine

## 2020-11-04 NOTE — Telephone Encounter (Signed)
Attempted to reach pt to schedule Complete Physical Exam w/ Dr. Alvis Lemmings, no answer, LVM to call [facility] to schedule this.

## 2020-11-04 NOTE — Telephone Encounter (Signed)
-----   Message from Royce Macadamia, RN sent at 10/28/2020  4:32 PM EDT ----- Regarding: FW: F/u appt  ----- Message ----- From: [name], MD Sent: 10/28/2020   3:30 PM EDT To: [facility] Admin Pool Subject: F/u appt                                       Hello, Please schedule for 1 month for complete physical exam. Thanks, -Dr Alvis Lemmings

## 2020-11-07 ENCOUNTER — Ambulatory Visit: Payer: Medicaid Other | Admitting: Podiatry

## 2020-11-13 ENCOUNTER — Encounter: Payer: Self-pay | Admitting: Physician Assistant

## 2020-11-13 ENCOUNTER — Emergency Department (HOSPITAL_COMMUNITY)
Admission: EM | Admit: 2020-11-13 | Discharge: 2020-11-13 | Disposition: A | Discharge status: Home / Self Care | Payer: Medicaid Other | Attending: Emergency Medicine | Admitting: Emergency Medicine

## 2020-11-13 ENCOUNTER — Encounter (HOSPITAL_COMMUNITY): Payer: Self-pay | Admitting: Pharmacy Technician

## 2020-11-13 ENCOUNTER — Other Ambulatory Visit: Payer: Self-pay

## 2020-11-13 ENCOUNTER — Ambulatory Visit: Payer: Medicaid Other | Attending: Physician Assistant | Admitting: Physician Assistant

## 2020-11-13 VITALS — BP 125/76 | HR 71 | Ht 63.0 in | Wt 155.4 lb

## 2020-11-13 DIAGNOSIS — Z9114 Patient's other noncompliance with medication regimen: Secondary | ICD-10-CM | POA: Diagnosis not present

## 2020-11-13 DIAGNOSIS — Z87891 Personal history of nicotine dependence: Secondary | ICD-10-CM | POA: Insufficient documentation

## 2020-11-13 DIAGNOSIS — Z7982 Long term (current) use of aspirin: Secondary | ICD-10-CM | POA: Diagnosis not present

## 2020-11-13 DIAGNOSIS — IMO0002 Reserved for concepts with insufficient information to code with codable children: Secondary | ICD-10-CM

## 2020-11-13 DIAGNOSIS — E118 Type 2 diabetes mellitus with unspecified complications: Secondary | ICD-10-CM

## 2020-11-13 DIAGNOSIS — R824 Acetonuria: Secondary | ICD-10-CM | POA: Diagnosis not present

## 2020-11-13 DIAGNOSIS — Z7984 Long term (current) use of oral hypoglycemic drugs: Secondary | ICD-10-CM | POA: Diagnosis not present

## 2020-11-13 DIAGNOSIS — B373 Candidiasis of vulva and vagina: Secondary | ICD-10-CM | POA: Diagnosis not present

## 2020-11-13 DIAGNOSIS — Z885 Allergy status to narcotic agent status: Secondary | ICD-10-CM | POA: Insufficient documentation

## 2020-11-13 DIAGNOSIS — Z888 Allergy status to other drugs, medicaments and biological substances status: Secondary | ICD-10-CM | POA: Diagnosis not present

## 2020-11-13 DIAGNOSIS — I10 Essential (primary) hypertension: Secondary | ICD-10-CM | POA: Insufficient documentation

## 2020-11-13 DIAGNOSIS — Z7902 Long term (current) use of antithrombotics/antiplatelets: Secondary | ICD-10-CM | POA: Insufficient documentation

## 2020-11-13 DIAGNOSIS — Z79899 Other long term (current) drug therapy: Secondary | ICD-10-CM | POA: Insufficient documentation

## 2020-11-13 DIAGNOSIS — R739 Hyperglycemia, unspecified: Secondary | ICD-10-CM

## 2020-11-13 DIAGNOSIS — I2510 Atherosclerotic heart disease of native coronary artery without angina pectoris: Secondary | ICD-10-CM | POA: Diagnosis not present

## 2020-11-13 DIAGNOSIS — E1165 Type 2 diabetes mellitus with hyperglycemia: Secondary | ICD-10-CM | POA: Insufficient documentation

## 2020-11-13 DIAGNOSIS — Z791 Long term (current) use of non-steroidal anti-inflammatories (NSAID): Secondary | ICD-10-CM | POA: Diagnosis not present

## 2020-11-13 DIAGNOSIS — Z79890 Hormone replacement therapy (postmenopausal): Secondary | ICD-10-CM | POA: Insufficient documentation

## 2020-11-13 DIAGNOSIS — Z794 Long term (current) use of insulin: Secondary | ICD-10-CM | POA: Diagnosis not present

## 2020-11-13 DIAGNOSIS — B3731 Acute candidiasis of vulva and vagina: Secondary | ICD-10-CM

## 2020-11-13 LAB — POCT URINALYSIS DIP (CLINITEK)
Bilirubin, UA: NEGATIVE
Glucose, UA: >=1000 mg/dL — AB
Leukocytes, UA: NEGATIVE
Nitrite, UA: NEGATIVE
POC PROTEIN,UA: NEGATIVE
Spec Grav, UA: 1.01 (ref 1.010–1.025)
Urobilinogen, UA: 0.2 E.U./dL
pH, UA: 5 (ref 5.0–8.0)

## 2020-11-13 LAB — WET PREP, GENITAL
Clue Cells Wet Prep HPF POC: NONE SEEN
Sperm: NONE SEEN
Trich, Wet Prep: NONE SEEN
Yeast Wet Prep HPF POC: NONE SEEN

## 2020-11-13 LAB — POCT GLYCOSYLATED HEMOGLOBIN (HGB A1C): HbA1c, POC (controlled diabetic range): 11.3 % — AB (ref 0.0–7.0)

## 2020-11-13 LAB — URINALYSIS, ROUTINE W REFLEX MICROSCOPIC
Bacteria, UA: NONE SEEN
Bilirubin Urine: NEGATIVE
Glucose, UA: >=500 mg/dL — AB
Ketones, ur: NEGATIVE mg/dL
Nitrite: NEGATIVE
Protein, ur: NEGATIVE mg/dL
Specific Gravity, Urine: 1.036 — ABNORMAL HIGH (ref 1.005–1.030)
pH: 5 (ref 5.0–8.0)

## 2020-11-13 LAB — COMPREHENSIVE METABOLIC PANEL
ALT: 6 U/L (ref 0–44)
AST: 23 U/L (ref 15–41)
Albumin: 3.6 g/dL (ref 3.5–5.0)
Alkaline Phosphatase: 107 U/L (ref 38–126)
Anion gap: 12 (ref 5–15)
BUN: 24 mg/dL — ABNORMAL HIGH (ref 6–20)
CO2: 23 mmol/L (ref 22–32)
Calcium: 10 mg/dL (ref 8.9–10.3)
Chloride: 97 mmol/L — ABNORMAL LOW (ref 98–111)
Creatinine, Ser: 0.69 mg/dL (ref 0.44–1.00)
GFR, Estimated: >60 mL/min (ref >60)
Glucose, Bld: 417 mg/dL — ABNORMAL HIGH (ref 70–99)
Potassium: 4.5 mmol/L (ref 3.5–5.1)
Sodium: 132 mmol/L — ABNORMAL LOW (ref 135–145)
Total Bilirubin: 1.2 mg/dL (ref 0.3–1.2)
Total Protein: 6.8 g/dL (ref 6.5–8.1)

## 2020-11-13 LAB — CBG MONITORING, ED
Glucose-Capillary: 453 mg/dL — ABNORMAL HIGH (ref 70–99)
Glucose-Capillary: 337 mg/dL — ABNORMAL HIGH (ref 70–99)
Glucose-Capillary: 291 mg/dL — ABNORMAL HIGH (ref 70–99)

## 2020-11-13 LAB — CBC WITH DIFFERENTIAL/PLATELET
Abs Immature Granulocytes: 0.02 10*3/uL (ref 0.00–0.07)
Basophils Absolute: 0.1 10*3/uL (ref 0.0–0.1)
Basophils Relative: 1 %
Eosinophils Absolute: 0.1 10*3/uL (ref 0.0–0.5)
Eosinophils Relative: 1 %
HCT: 46.7 % — ABNORMAL HIGH (ref 36.0–46.0)
Hemoglobin: 15.9 g/dL — ABNORMAL HIGH (ref 12.0–15.0)
Immature Granulocytes: 0 %
Lymphocytes Relative: 36 %
Lymphs Abs: 2.6 10*3/uL (ref 0.7–4.0)
MCH: 30.9 pg (ref 26.0–34.0)
MCHC: 34 g/dL (ref 30.0–36.0)
MCV: 90.7 fL (ref 80.0–100.0)
Monocytes Absolute: 0.6 10*3/uL (ref 0.1–1.0)
Monocytes Relative: 8 %
Neutro Abs: 3.7 10*3/uL (ref 1.7–7.7)
Neutrophils Relative %: 54 %
Platelets: 305 10*3/uL (ref 150–400)
RBC: 5.15 MIL/uL — ABNORMAL HIGH (ref 3.87–5.11)
RDW: 12.5 % (ref 11.5–15.5)
WBC: 7.1 10*3/uL (ref 4.0–10.5)
nRBC: 0 % (ref 0.0–0.2)

## 2020-11-13 LAB — GLUCOSE, POCT (MANUAL RESULT ENTRY): POC Glucose: 527 mg/dl — AB (ref 70–99)

## 2020-11-13 MED ORDER — SODIUM CHLORIDE 0.9 % IV BOLUS
1000.0000 mL | Freq: Once | INTRAVENOUS | Status: AC
Start: 2020-11-13 — End: 2020-11-13
  Administered 2020-11-13: 1000 mL via INTRAVENOUS

## 2020-11-13 MED ORDER — MONISTAT 1 COMBO PACK 1200 & 2 MG & % VA KIT: 1.0000 | PACK | Freq: Once | VAGINAL | 0 refills | Status: AC

## 2020-11-13 MED ORDER — INSULIN ASPART 100 UNIT/ML IJ SOLN
10.0000 [IU] | Freq: Once | INTRAMUSCULAR | Status: AC
  Administered 2020-11-13: 10 [IU] via SUBCUTANEOUS

## 2020-11-13 NOTE — Discharge Instructions (Signed)
You came to the emergency department today to be evaluated for your high blood sugar.  Your lab work showed that you are not in diabetic ketoacidosis.  Your sugar improved after receiving a 1 L fluid bolus.  It is very important that you take your prescribed medication for diabetes.  Please keep your follow-up appointment with [place] and wellness clinic.  Based on your complaints and physical exam I have started you on Monistat to treat for a vaginal yeast infection.  Please use this medication as prescribed.  If the medication is too expensive when you go to the pharmacy you may get a over-the-counter version to use instead.  Get help right away if: Your blood glucose monitor reads "high" even when you are taking insulin. You have trouble breathing. You have a change in how you think, feel, or act (mental status). You have nausea or vomiting that does not go away.

## 2020-11-13 NOTE — ED Triage Notes (Signed)
Pt here with reports of hyperglycemia. States today her blood sugar was 546. Pt endorses not taking her insulin for the last 2 days.

## 2020-11-13 NOTE — ED Notes (Signed)
Pt CBG 337 - PA notified

## 2020-11-13 NOTE — ED Provider Notes (Signed)
[facility]PART[facility] Provider Note   CSN: [id number] Arrival date & time: 11/13/20  1213     History Chief Complaint  Patient presents with   Hyperglycemia    Sarah Charles is a 60 y.o. female with a history of aortic hypertension, type 2 diabetes mellitus, hyperlipidemia, CAD.  Patient presents to the emergency department from primary care provider's office due to hyperglycemia.  Patient reports that blood sugar was 560 when assessed at doctor's office earlier today.  Per chart review patient was given 10 units of NovoLog, 20 ounces of water orally and sent to the emergency department to be stabilized.  Patient reports that she has not taken insulin in the last 3 days because she agrees "lazy."  Patient denies compliance with carb consistent diet.  Patient endorses polyuria and polydipsia.  Patient denies any nausea, vomiting, diarrhea, abdominal pain, fevers, chills.  Patient also endorses vaginal itching and dysuria.  Patient denies any hematuria, vaginal bleeding, vaginal discharge, vaginal pain, pelvic pain.  Patient reports that she has not been sexually active in 2 years.  Patient denies any genital sores or lesions.     Hyperglycemia Associated symptoms: dysuria, increased thirst and polyuria   Associated symptoms: no abdominal pain, no chest pain, no confusion, no dizziness, no fever, no nausea, no shortness of breath and no vomiting   Hyperglycemia Associated symptoms: dysuria, increased thirst and polyuria   Associated symptoms: no abdominal pain, no chest pain, no confusion, no dizziness, no fever, no nausea, no shortness of breath and no vomiting   Hyperglycemia Associated symptoms: dysuria, increased thirst and polyuria   Associated symptoms: no abdominal pain, no chest pain, no confusion, no dizziness, no fever, no nausea, no shortness of breath and no vomiting   Hyperglycemia Associated symptoms: dysuria, increased thirst and polyuria    Associated symptoms: no abdominal pain, no chest pain, no confusion, no dizziness, no fever, no nausea, no shortness of breath and no vomiting   Hyperglycemia Associated symptoms: increased thirst and polyuria   Associated symptoms: no abdominal pain, no chest pain, no confusion, no dizziness, no dysuria, no fever, no nausea, no shortness of breath and no vomiting   Hyperglycemia Associated symptoms: increased thirst   Associated symptoms: no abdominal pain, no chest pain, no confusion, no dizziness, no dysuria, no fever, no nausea, no shortness of breath and no vomiting   Hyperglycemia Associated symptoms: no abdominal pain, no chest pain, no confusion, no dizziness, no dysuria, no fever, no nausea, no shortness of breath and no vomiting       Past Medical History:  Diagnosis Date   Arthritis    Coronary artery calcification seen on CT scan    a. 07/2017 noted on CT abd.   Depression    GERD (gastroesophageal reflux disease)    Hyperlipidemia    Hypertension    Obesity    Sleep apnea    a. Does not use CPAP "cause i dont think I need it anymore".   Tobacco abuse    Type II diabetes mellitus [facility])     Patient Active Problem List   Diagnosis Date Noted   CAD (coronary artery disease) 11/22/2017   Chest pain, precordial 11/04/2017   Acute chest pain    Type 2 diabetes mellitus with hyperlipidemia ([place]) 10/29/2017   Unstable angina ([place]) 10/29/2017   Diabetic polyneuropathy associated with type 2 diabetes mellitus ([place]) 05/30/2017   Mixed dyslipidemia 05/30/2017   Depression 05/31/2016   Hypertension 05/31/2016  Uncontrolled type 2 diabetes mellitus with complication ([place]) 26/37/8588   Uncontrolled type 2 diabetes mellitus with complication, with long-term current use of insulin ([place]) 04/20/2015   Cellulitis of breast 12/29/2013   Diabetes mellitus ([place]) 12/29/2013   Cellulitis of female breast 12/29/2013   Symptomatic menopausal or female climacteric states 04/08/2008    OTHER SPECIFIED DISEASE OF NAIL 04/08/2008   URINARY URGENCY 04/08/2008   COUGH 04/02/2008   HYPERTENSION, BENIGN ESSENTIAL 04/12/2007   FATIGUE 04/12/2007   OBESITY, NOS 07/21/2006   Major depressive disorder, recurrent episode ([place]) 07/21/2006   TOBACCO DEPENDENCE 07/21/2006   GASTROESOPHAGEAL REFLUX, NO ESOPHAGITIS 07/21/2006   INCONTINENCE, STRESS, FEMALE 07/21/2006    Past Surgical History:  Procedure Laterality Date   CHOLECYSTECTOMY     CORONARY STENT INTERVENTION N/A 11/04/2017   Procedure: CORONARY STENT INTERVENTION;  Surgeon: Martinique, Preston Garabedian M, MD;  Location: [facility];  Service: Cardiovascular;  Laterality: N/A;   INCISION AND DRAINAGE ABSCESS Left 12/22/2012   Procedure: INCISION AND DRAINAGE ABSCESS;  Surgeon: Jamesetta So, MD;  Location: [facility];  Service: General;  Laterality: Left;   INTRAVASCULAR PRESSURE WIRE/FFR STUDY N/A 11/04/2017   Procedure: INTRAVASCULAR PRESSURE WIRE/FFR STUDY;  Surgeon: Martinique, Lebron Nauert M, MD;  Location: [facility];  Service: Cardiovascular;  Laterality: N/A;   LEFT HEART CATH AND CORONARY ANGIOGRAPHY N/A 11/04/2017   Procedure: LEFT HEART CATH AND CORONARY ANGIOGRAPHY;  Surgeon: Martinique, Shalea Tomczak M, MD;  Location: [facility];  Service: Cardiovascular;  Laterality: N/A;   TONSILLECTOMY     TUBAL LIGATION       OB History   No obstetric history on file.     Family History  Problem Relation Age of Onset   Hypertension Mother    CVA Mother    COPD Mother    Heart disease Mother    Kidney Stones Mother    Heart attack Mother        MI in late 27's   Breast cancer Mother    CAD Father    Hypercholesterolemia Father    Heart attack Father        Died @ 20 of MI   Cancer Maternal Uncle    Diabetes Maternal Grandmother    Cancer Maternal Grandfather        lung cancer   Diabetes Sister     Social History   Tobacco Use   Smoking status: Former    Packs/day: 1.00    Years: 41.00    Pack years: 41.00    Types:  Cigarettes    Quit date: 10/07/2018    Years since quitting: 2.1   Smokeless tobacco: Never  Substance Use Topics   Alcohol use: No   Drug use: No    Home Medications Prior to Admission medications   Medication Sig Start Date End Date Taking? Authorizing Provider  Accu-Chek FastClix Lancets MISC AS DIRECTED TWICE DAILY 10/07/20   Charlott Rakes, MD  amLODipine (NORVASC) 5 MG tablet Take 1 tablet (5 mg total) by mouth daily. 08/14/20 11/12/20  [name], PA-C  aspirin EC 81 MG tablet Take 81 mg by mouth daily.    [provider]  Blood Glucose Monitoring Suppl (ACCU-CHEK GUIDE ME) w/Device KIT 1 each by Does not apply route 3 (three) times daily. 11/09/18   [name], PA-C  buPROPion (WELLBUTRIN SR) 150 MG 12 hr tablet Take 1 tablet (150 mg total) by mouth 2 (two) times daily. 08/14/20   McClung,  Dionne Bucy, PA-C  cetirizine (ZYRTEC) 10 MG tablet TAKE 1 TABLET BY MOUTH EVERY DAY 08/02/19   Charlott Rakes, MD  clopidogrel (PLAVIX) 75 MG tablet Take 1 tablet (75 mg total) by mouth daily. 08/14/20   [name], PA-C  dicyclomine (BENTYL) 10 MG capsule Take 1 capsule by mouth daily. 05/26/18   [provider]  doxycycline (VIBRA-TABS) 100 MG tablet Take 1 tablet (100 mg total) by mouth 2 (two) times daily. 08/14/20   [name], PA-C  fenofibrate (TRICOR) 145 MG tablet Take 1 tablet (145 mg total) by mouth daily. 08/14/20   [name], PA-C  FLUoxetine (PROZAC) 20 MG capsule TAKE 1 CAPSULE BY MOUTH EVERY DAY 08/14/20   Freeman Caldron M, PA-C  gabapentin (NEURONTIN) 300 MG capsule TAKE ONE CAPSULE BY MOUTH twice daily 10/27/20   Charlott Rakes, MD  glucose blood (ACCU-CHEK GUIDE) test strip USE AS DIRECTED 08/14/20   [name], PA-C  insulin glargine (LANTUS SOLOSTAR) 100 UNIT/ML Solostar Pen Inject 60 Units into the skin at bedtime. 08/14/20   [name], PA-C  Insulin Pen Needle (B-D UF III MINI PEN NEEDLES) 31G X 5 MM MISC USE AS DIRECTED  08/14/20   [name], PA-C  Lancets Misc. (ACCU-CHEK FASTCLIX LANCET) KIT USE AS DIRECTED 09/11/20   Charlott Rakes, MD  liraglutide (VICTOZA) 18 MG/3ML SOPN Sq 1.31m once a day 08/14/20   MArgentina Donovan PA-C  lisinopril (ZESTRIL) 20 MG tablet Take 1 tablet (20 mg total) by mouth daily. 08/14/20   MArgentina Donovan PA-C  meloxicam (MOBIC) 7.5 MG tablet Take 1 tablet (7.5 mg total) by mouth daily. 02/04/20   NCharlott Rakes MD  methocarbamol (ROBAXIN) 500 MG tablet Take 1 tablet (500 mg total) by mouth 2 (two) times daily as needed (back pain). 08/14/20   MArgentina Donovan PA-C  metoCLOPramide (REGLAN) 10 MG tablet TAKE 1 TABLET BY MOUTH EVERY EIGHT HOURS AS NEEDED FOR NAUSEA OR VOMITING. 08/14/20   MArgentina Donovan PA-C  metoprolol tartrate (LOPRESSOR) 25 MG tablet Take 1 tablet (25 mg total) by mouth 2 (two) times daily. 08/14/20   MArgentina Donovan PA-C  Multiple Vitamin (MULTIVITAMIN) tablet Take 1 tablet by mouth daily.    [provider]  mupirocin ointment (BACTROBAN) 2 % Apply 1 application topically 2 (two) times daily. X 7 days at the first sign of skin infection 08/14/20   MArgentina Donovan PA-C  nitroGLYCERIN (NITROSTAT) 0.4 MG SL tablet Place 0.4 mg under the tongue every 5 (five) minutes as needed for chest pain.    [provider]  pantoprazole (PROTONIX) 40 MG tablet Take 1 tablet (40 mg total) by mouth daily. 08/14/20   MArgentina Donovan PA-C  rosuvastatin (CRESTOR) 40 MG tablet Take 1 tablet (40 mg total) by mouth daily. 08/14/20   MArgentina Donovan PA-C  sitaGLIPtin-metformin (JANUMET) 50-1000 MG tablet Take 1 tablet by mouth 2 (two) times daily with a meal. 08/14/20   McClung, ADionne Bucy PA-C  traZODone (DESYREL) 50 MG tablet Take 1 tablet (50 mg total) by mouth at bedtime as needed. for sleep 08/14/20   MArgentina Donovan PA-C  vitamin B-12 (CYANOCOBALAMIN) 500 MCG tablet Take 1 tablet (500 mcg total) by mouth daily. 08/14/20   MArgentina Donovan PA-C   vitamin C (ASCORBIC ACID) 500 MG tablet Take 500 mg by mouth daily.    [provider]    Allergies    Cymbalta [duloxetine hcl]  Review of Systems   Review of Systems  Constitutional:  Negative for chills and fever.  Eyes:  Negative for visual disturbance.  Respiratory:  Negative for shortness of breath.   Cardiovascular:  Negative for chest pain.  Gastrointestinal:  Negative for abdominal pain, diarrhea, nausea and vomiting.  Endocrine: Positive for polydipsia and polyuria. Negative for polyphagia.  Genitourinary:  Positive for dysuria and frequency. Negative for difficulty urinating, genital sores, hematuria, vaginal bleeding, vaginal discharge and vaginal pain.  Musculoskeletal:  Negative for back pain and neck pain.  Skin:  Negative for color change and rash.  Neurological:  Negative for dizziness, syncope, light-headedness and headaches.  Psychiatric/Behavioral:  Negative for confusion.    Physical Exam Updated Vital Signs BP 137/87 (BP Location: Right Arm)   Pulse 71   Temp (!) 97.5 F (36.4 C) (Oral)   Resp 12   SpO2 99%   Physical Exam Vitals and nursing note reviewed. Exam conducted with a chaperone present (Female nurse tech present as chaperone).  Constitutional:      General: She is not in acute distress.    Appearance: She is not ill-appearing, toxic-appearing or diaphoretic.  HENT:     Head: Normocephalic.  Eyes:     General: No scleral icterus.       Right eye: No discharge.        Left eye: No discharge.  Cardiovascular:     Rate and Rhythm: Normal rate.  Pulmonary:     Effort: Pulmonary effort is normal. No tachypnea, bradypnea or respiratory distress.  Abdominal:     General: Bowel sounds are normal. There is no distension. There are no signs of injury.     Palpations: Abdomen is soft. There is no mass or pulsatile mass.     Tenderness: There is no abdominal tenderness. There is no guarding or rebound.  Genitourinary:    Pubic Area: Rash  present. No pubic lice.      Tanner stage (genital): 5.     Labia:        Right: Rash present. No tenderness, lesion or injury.        Left: Rash present. No tenderness, lesion or injury.      Urethra: No prolapse.     Vagina: No signs of injury and foreign body. Vaginal discharge present. No erythema, tenderness, bleeding, lesions or prolapsed vaginal walls.     Cervix: No cervical motion tenderness, discharge, friability, lesion, erythema, cervical bleeding or eversion.     Comments: Erythema noted to labia and around vulva.  White discharge noted to vaginal vault. Skin:    General: Skin is warm and dry.  Neurological:     General: No focal deficit present.     Mental Status: She is alert and oriented to person, place, and time.     GCS: GCS eye subscore is 4. GCS verbal subscore is 5. GCS motor subscore is 6.  Psychiatric:        Behavior: Behavior is cooperative.    ED Results / Procedures / Treatments   Labs (all labs ordered are listed, but only abnormal results are displayed) Labs Reviewed  WET PREP, GENITAL - Abnormal; Notable for the following components:      Result Value   WBC, Wet Prep HPF POC MANY (*)    All other components within normal limits  COMPREHENSIVE METABOLIC PANEL - Abnormal; Notable for the following components:   Sodium 132 (*)    Chloride 97 (*)    Glucose, Bld  417 (*)    BUN 24 (*)    All other components within normal limits  URINALYSIS, ROUTINE W REFLEX MICROSCOPIC - Abnormal; Notable for the following components:   APPearance HAZY (*)    Specific Gravity, Urine 1.036 (*)    Glucose, UA >=500 (*)    Hgb urine dipstick SMALL (*)    Leukocytes,Ua SMALL (*)    All other components within normal limits  CBC WITH DIFFERENTIAL/PLATELET - Abnormal; Notable for the following components:   RBC 5.15 (*)    Hemoglobin 15.9 (*)    HCT 46.7 (*)    All other components within normal limits  CBG MONITORING, ED - Abnormal; Notable for the following  components:   Glucose-Capillary 453 (*)    All other components within normal limits  CBG MONITORING, ED - Abnormal; Notable for the following components:   Glucose-Capillary 337 (*)    All other components within normal limits  CBG MONITORING, ED - Abnormal; Notable for the following components:   Glucose-Capillary 291 (*)    All other components within normal limits    EKG None  Radiology No results found.  Procedures Procedures   Medications Ordered in ED Medications - No data to display  ED Course  I have reviewed the triage vital signs and the nursing notes.  Pertinent labs & imaging results that were available during my care of the patient were reviewed by me and considered in my medical decision making (see chart for details).    MDM Rules/Calculators/A&P                          Alert 60 year old female no acute distress, nontoxic-appearing.  Patient presents emergency department with a chief complaint of hyperglycemia.  Patient states that blood sugar was 560 when assessed earlier today at her primary care provider's office.  Patient was given 10 units of NovoLog, 20 ounces of water orally and sent to emergency department.  Patient is noncompliant with insulin and diet.  Patient reports that she has not taken insulin in 3 days.  Patient denies any abdominal pain, nausea, vomiting, or diarrhea.  Patient also endorses vaginal itching and dysuria.  Patient denies any vaginal discharge, vaginal pain, vaginal bleeding, genital sores or lesions.  Patient has not been sexually active in the last 2 years.  CBC shows hemoconcentration and CMP shows BUN elevated at 24; these findings are likely secondary to dehydration. Repeat CBG shows glucose at 337. Low suspicion for DKA as bicarb and anion gap within normal limits. Will give patient 1 L fluid bolus and reassess CBG.  Urinalysis shows no signs of infection. Pelvic exam shows erythema to labia and surrounding vulva.  White  discharge noted in vaginal vault.  Wet prep obtained shows no yeast.  Based on physical call exam and entry glucose in urine concern for vulvovaginal candidiasis.  Will prescribe patient with miconazole for treatment.  Patient was offered STI testing however defers due to no sexual activity in the last 2 years.  CBG 291 after patient received 1 L fluid bolus.  Patient has scheduled follow-up with primary care provider.  Primary care provider and myself discussed importance of medication and diet compliance.  Patient given strict return precautions.  Patient expresses understanding of all instructions and is agreeable with this plan.  Final Clinical Impression(s) / ED Diagnoses Final diagnoses:  Hyperglycemia  Vulvovaginal candidiasis    Rx / DC Orders ED Discharge Orders  Ordered    miconazole (MONISTAT 1 COMBO PACK) kit   Once        11/13/20 2220             Dyann Ruddle 11/14/20 0216    Fredia Sorrow, MD 11/20/20 1355

## 2020-11-13 NOTE — Progress Notes (Signed)
Patient ID: Sarah Charles, female   DOB: 16-Nov-1960, 60 y.o.   MRN: [id number]   Sarah Charles, is a 60 y.o. female  DDU:[id number]  CBJ:[id number]  DOB - 11-24-1960  Subjective:  Chief Complaint and HPI: Sarah Charles is a 60 y.o. female here today for check up.  No concerns or complaints.  Denies vision changes/polyuria/polydipsia.  Has not taking lantus in several days.  Says she takes victoza daily but has not taken it today.   Patient says she doesn't "understand why her blood sugars are always high" after we discussed the problem with her not taking her meds.  She does not check blood sugar.  She does not follow diabetic diet.    ROS:   Constitutional:  No f/c, No night sweats, No unexplained weight loss. EENT:  No vision changes, No blurry vision, No hearing changes. No mouth, throat, or ear problems.  Respiratory: No cough, No SOB Cardiac: No CP, no palpitations GI:  No abd pain, No N/V/D. GU: No Urinary s/sx Musculoskeletal: No joint pain Neuro: No headache, no dizziness, no motor weakness.  Skin: No rash Endocrine:  No polydipsia. No polyuria.  Psych: Denies SI/HI  No problems updated.  ALLERGIES: Allergies  Allergen Reactions   Cymbalta [Duloxetine Hcl] Nausea Only    Nausea, lack of therapeutic effect    PAST MEDICAL HISTORY: Past Medical History:  Diagnosis Date   Arthritis    Coronary artery calcification seen on CT scan    a. 07/2017 noted on CT abd.   Depression    GERD (gastroesophageal reflux disease)    Hyperlipidemia    Hypertension    Obesity    Sleep apnea    a. Does not use CPAP "cause i dont think I need it anymore".   Tobacco abuse    Type II diabetes mellitus ([place])     MEDICATIONS AT HOME: Prior to Admission medications   Medication Sig Start Date End Date Taking? Authorizing Provider  Accu-Chek FastClix Lancets MISC AS DIRECTED TWICE DAILY 10/07/20  Yes Charlott Rakes, MD  aspirin EC 81 MG tablet Take 81 mg by mouth daily.   Yes [provider]  Blood Glucose Monitoring Suppl (ACCU-CHEK GUIDE ME) w/Device KIT 1 each by Does not apply route 3 (three) times daily. 11/09/18  Yes Ayyub Krall M, PA-C  buPROPion (WELLBUTRIN SR) 150 MG 12 hr tablet Take 1 tablet (150 mg total) by mouth 2 (two) times daily. 08/14/20  Yes Tionne Dayhoff, Levada Dy M, PA-C  cetirizine (ZYRTEC) 10 MG tablet TAKE 1 TABLET BY MOUTH EVERY DAY 08/02/19  Yes Charlott Rakes, MD  clopidogrel (PLAVIX) 75 MG tablet Take 1 tablet (75 mg total) by mouth daily. 08/14/20  Yes Freeman Caldron M, PA-C  dicyclomine (BENTYL) 10 MG capsule Take 1 capsule by mouth daily. 05/26/18  Yes [provider]  doxycycline (VIBRA-TABS) 100 MG tablet Take 1 tablet (100 mg total) by mouth 2 (two) times daily. 08/14/20  Yes Blaiden Werth M, PA-C  fenofibrate (TRICOR) 145 MG tablet Take 1 tablet (145 mg total) by mouth daily. 08/14/20  Yes Freeman Caldron M, PA-C  FLUoxetine (PROZAC) 20 MG capsule TAKE 1 CAPSULE BY MOUTH EVERY DAY 08/14/20  Yes Infantof Villagomez M, PA-C  gabapentin (NEURONTIN) 300 MG capsule TAKE ONE CAPSULE BY MOUTH twice daily 10/27/20  Yes Newlin, Enobong, MD  glucose blood (ACCU-CHEK GUIDE) test strip USE AS DIRECTED 08/14/20  Yes Marieke Lubke M, PA-C  insulin glargine (LANTUS SOLOSTAR) 100 UNIT/ML Solostar Pen Inject 60  Units into the skin at bedtime. 08/14/20  Yes [name], PA-C  Insulin Pen Needle (B-D UF III MINI PEN NEEDLES) 31G X 5 MM MISC USE AS DIRECTED 08/14/20  Yes Onika Gudiel, Dionne Bucy, PA-C  Lancets Misc. (ACCU-CHEK FASTCLIX LANCET) KIT USE AS DIRECTED 09/11/20  Yes Charlott Rakes, MD  liraglutide (VICTOZA) 18 MG/3ML SOPN Sq 1.46m once a day 08/14/20  Yes Radhika Dershem M, PA-C  lisinopril (ZESTRIL) 20 MG tablet Take 1 tablet (20 mg total) by mouth daily. 08/14/20  Yes MFreeman CaldronM, PA-C  meloxicam (MOBIC) 7.5 MG tablet Take 1 tablet (7.5 mg total) by mouth daily. 02/04/20  Yes NCharlott Rakes MD  methocarbamol (ROBAXIN) 500 MG tablet Take 1 tablet  (500 mg total) by mouth 2 (two) times daily as needed (back pain). 08/14/20  Yes Konnie Noffsinger, ADionne Bucy PA-C  metoCLOPramide (REGLAN) 10 MG tablet TAKE 1 TABLET BY MOUTH EVERY EIGHT HOURS AS NEEDED FOR NAUSEA OR VOMITING. 08/14/20  Yes Giulietta Prokop M, PA-C  metoprolol tartrate (LOPRESSOR) 25 MG tablet Take 1 tablet (25 mg total) by mouth 2 (two) times daily. 08/14/20  Yes MArgentina Donovan PA-C  Multiple Vitamin (MULTIVITAMIN) tablet Take 1 tablet by mouth daily.   Yes [provider]  mupirocin ointment (BACTROBAN) 2 % Apply 1 application topically 2 (two) times daily. X 7 days at the first sign of skin infection 08/14/20  Yes Kymia Simi, ADionne Bucy PA-C  nitroGLYCERIN (NITROSTAT) 0.4 MG SL tablet Place 0.4 mg under the tongue every 5 (five) minutes as needed for chest pain.   Yes [provider]  pantoprazole (PROTONIX) 40 MG tablet Take 1 tablet (40 mg total) by mouth daily. 08/14/20  Yes MFreeman CaldronM, PA-C  rosuvastatin (CRESTOR) 40 MG tablet Take 1 tablet (40 mg total) by mouth daily. 08/14/20  Yes MFreeman CaldronM, PA-C  sitaGLIPtin-metformin (JANUMET) 50-1000 MG tablet Take 1 tablet by mouth 2 (two) times daily with a meal. 08/14/20  Yes Sidhant Helderman M, PA-C  traZODone (DESYREL) 50 MG tablet Take 1 tablet (50 mg total) by mouth at bedtime as needed. for sleep 08/14/20  Yes MArgentina Donovan PA-C  vitamin B-12 (CYANOCOBALAMIN) 500 MCG tablet Take 1 tablet (500 mcg total) by mouth daily. 08/14/20  Yes MFreeman CaldronM, PA-C  vitamin C (ASCORBIC ACID) 500 MG tablet Take 500 mg by mouth daily.   Yes [provider]  amLODipine (NORVASC) 5 MG tablet Take 1 tablet (5 mg total) by mouth daily. 08/14/20 11/12/20  MArgentina Donovan PA-C     Objective:  EXAM:   Vitals:   11/13/20 1034  BP: 125/76  Pulse: 71  SpO2: 97%  Weight: 155 lb 6.4 oz (70.5 kg)  Height: '5\' 3"'  (1.6 m)    General appearance : A&OX3. NAD. Non-toxic-appearing HEENT: Atraumatic and Normocephalic.   PERRLA. EOM intact.   Chest/Lungs:  Breathing-non-labored, Good air entry bilaterally, breath sounds normal without rales, rhonchi, or wheezing  CVS: S1 S2 regular, no murmurs, gallops, rubs  Extremities: Bilateral Lower Ext shows no edema, both legs are warm to touch with = pulse throughout Neurology:  CN II-XII grossly intact, Non focal.   Psych:  TP linear. J/I poor. Normal speech. Appropriate eye contact and depressed affect.  Skin:  No Rash  Data Review Lab Results  Component Value Date   HGBA1C 11.3 (A) 11/13/2020   HGBA1C 8.2 (A) 08/14/2020   HGBA1C 11.1 (A) 12/10/2019     Assessment & Plan   1.  Uncontrolled type 2 diabetes mellitus with complication, with long-term current use of insulin ([place]) Needs more advanced care/acute assessment than we can offer in our office today.   Please comply with current regimen so adjustments may be made as needed.  She is hemodynamically stable but this could change quickly given her poor compliance.  She has agreed to go to the ED to be stabilized.  We had a lengthy discussion about compliance.  - Glucose (CBG) - HgB A1c - insulin aspart (novoLOG) injection 10 Units now 20 ounces water orally - POCT URINALYSIS DIP (CLINITEK)  2. Non compliance w medication regimen Advised using timers, etc to remember meds  3. Ketonuria Patient drank 20 ounces of water prior to leaving our office and verbalizes understanding to go directly to ED  Advised to check blood sugars and take meds as directed   Patient have been counseled extensively about nutrition and exercise  Return in about 3 weeks (around 12/04/2020) for 3 weeks with Lurena Joiner for DM; 6 weeks with Dr Cherene Altes for hospital follow up.  The patient was given clear instructions to go to ER or return to medical center if symptoms don't improve, worsen or new problems develop. The patient verbalized understanding. The patient was told to call to get lab results if they haven't heard anything in the  next week.     Freeman Caldron, PA-C [facility] and [facility], [place]   11/13/2020, 10:59 AM

## 2020-11-13 NOTE — Patient Instructions (Signed)
Go directly the emergency dept.  We have given you 10 units of quick acting insulin to keep your blood sugar from going higher.

## 2020-11-13 NOTE — ED Notes (Signed)
Patient verbalizes understanding of discharge instructions. Opportunity for questioning and answers were provided. Armband removed by staff, pt discharged from ED ambulatory.   

## 2020-11-13 NOTE — ED Notes (Signed)
Pelvic cart at bedside. 

## 2020-11-13 NOTE — ED Provider Notes (Signed)
Emergency Medicine Provider Triage Evaluation Note  Sarah Charles , a 60 y.o. female  was evaluated in triage.  Pt complains of hyperglycemia.  She states that she has all of her medicines available however she has not taken her insulin for the past 2 days.  When asked why she states that she simply sometimes gets tired of taking it.  She reports polydipsia and polyuria.  She denies any other complaints or concerns today.  Review of Systems  Positive: Hyperglycemia, polydipsia, polyuria Negative: Fevers, vomiting  Physical Exam  BP (!) 137/104   Pulse 80   Temp (!) 97.5 F (36.4 C) (Oral)   Resp 16   SpO2 98%  Gen:   Awake, no distress   Resp:  Normal effort  MSK:   Moves extremities without difficulty  Other:  Normal gait.  Patient is awake and alert, answers questions without difficulty.  Speech is not slurred.  Medical Decision Making  Medically screening exam initiated at 12:28 PM.  Appropriate orders placed.  Sarah Charles was informed that the remainder of the evaluation will be completed by another provider, this initial triage assessment does not replace that evaluation, and the importance of remaining in the ED until their evaluation is complete.  Note: Portions of this report may have been transcribed using voice recognition software. Every effort was made to ensure accuracy; however, inadvertent computerized transcription errors may be present    Norman Clay 11/13/20 1229    Tilden Fossa, MD 11/13/20 [phone]

## 2020-11-14 ENCOUNTER — Telehealth: Payer: Self-pay | Admitting: *Deleted

## 2020-11-14 NOTE — Patient Outreach (Signed)
Script of Monistat sent to [employer], insurance doesn't cover it. Told to get OTC.

## 2020-11-14 NOTE — Telephone Encounter (Signed)
Transition Care Management Unsuccessful Follow-up Telephone Call  Date of discharge and from where:  11/13/2020 - [place] [facility]  Attempts:  1st Attempt  Reason for unsuccessful TCM follow-up call:  Left voice message 

## 2020-11-17 ENCOUNTER — Other Ambulatory Visit: Payer: Self-pay

## 2020-11-17 ENCOUNTER — Ambulatory Visit: Payer: Medicaid Other | Admitting: Family Medicine

## 2020-11-17 NOTE — Telephone Encounter (Signed)
Transition Care Management Unsuccessful Follow-up Telephone Call  Date of discharge and from where:  11/13/2020 [facility]  Attempts:  2nd Attempt  Reason for unsuccessful TCM follow-up call:  Left voice message

## 2020-11-17 NOTE — Patient Outreach (Signed)
Patient due for meds on 11/07/20 but states has a lot of extra. Changed date to 11/26/20  Gabapentin  300 mg        Aspirin  81 mg   1     Fish Oil  1200 mg  1  1   Fenofibrate  145 mg     1   Metoprolol Tartrate 25 mg   1  1   Pantoprazole  40 mg   1     Vitamin B-12  500 mcg  1     Amlodipine  5 mg   1     Fluoxetine  20 mg   1     Bupropion  Sr 150 mg  1  1   Trazodone  50 mg      1  Clopidogrel  75 mg   1     Rosuvastatin  40 mg      1   Lisinopril  20 mg   1     Victoza  18mg /67ml      Accu-Chek Guide Test Strips n/a        Lantus Solostar  100u/ml       Janumet     50/1000 1  1

## 2020-11-18 NOTE — Telephone Encounter (Signed)
Transition Care Management Unsuccessful Follow-up Telephone Call  Date of discharge and from where:  11/13/2020 [facility]  Attempts:  3rd Attempt  Reason for unsuccessful TCM follow-up call:  Left voice message

## 2020-11-19 ENCOUNTER — Other Ambulatory Visit: Payer: Self-pay | Admitting: Obstetrics and Gynecology

## 2020-11-19 ENCOUNTER — Other Ambulatory Visit: Payer: Self-pay

## 2020-11-19 DIAGNOSIS — IMO0002 Reserved for concepts with insufficient information to code with codable children: Secondary | ICD-10-CM

## 2020-11-19 NOTE — Patient Outreach (Signed)
Medicaid Managed Care   Nurse Care Manager Note  11/19/2020 Name:  Sarah Charles MRN:  [id number] DOB:  10-21-1960  Sarah Charles is an 60 y.o. year old female who is a primary patient of Charlott Rakes, MD.  The [facility] Managed Care Coordination team was consulted for assistance with:    Chronic healthcare management needs.  [name] was given information about Medicaid Managed Care Coordination team services today. Sarah Charles agreed to services and verbal consent obtained.  Engaged with patient by telephone for follow up visit in response to provider referral for case management and/or care coordination services.   Assessments/Interventions:  Review of past medical history, allergies, medications, health status, including review of consultants reports, laboratory and other test data, was performed as part of comprehensive evaluation and provision of chronic care management services.  SDOH (Social Determinants of Health) assessments and interventions performed:   Care Plan  Allergies  Allergen Reactions   Cymbalta [Duloxetine Hcl] Nausea Only    Nausea, lack of therapeutic effect    Medications Reviewed Today     Reviewed by Gayla Medicus, RN (Registered Nurse) on 11/19/20 at 1104  Med List Status: <None>   Medication Order Taking? Sig Documenting Provider Last Dose Status Informant  Accu-Chek FastClix Lancets MISC [id number] No AS DIRECTED TWICE DAILY  Patient taking differently: 1 each by Other route 2 (two) times daily.   Charlott Rakes, MD 11/13/2020 Active Self  amLODipine (NORVASC) 5 MG tablet [id number] No Take 1 tablet (5 mg total) by mouth daily. Sarah Charles, [place] Past Week Expired 11/13/20 2359 Self  aspirin EC 81 MG tablet [id number] No Take 81 mg by mouth daily. [provider] Past Week Active Self  Blood Glucose Monitoring Suppl (ACCU-CHEK GUIDE ME) w/Device KIT [id number] No 1 each by Does not apply route 3 (three) times daily. Sarah Charles, [place]  11/13/2020 Active Self  buPROPion (WELLBUTRIN SR) 150 MG 12 hr tablet [id number] No Take 1 tablet (150 mg total) by mouth 2 (two) times daily. Sarah Donovan, PA-C Past Week Active Self  cetirizine (ZYRTEC) 10 MG tablet [id number] No TAKE 1 TABLET BY MOUTH EVERY DAY  Patient taking differently: Take 10 mg by mouth daily.   Charlott Rakes, MD Past Week Active Self  clopidogrel (PLAVIX) 75 MG tablet [id number] No Take 1 tablet (75 mg total) by mouth daily. Sarah Charles, [place] 11/13/2020 Active Self  dicyclomine (BENTYL) 10 MG capsule [id number] No Take 1 capsule by mouth daily. [provider] Past Week Active Self  doxycycline (VIBRA-TABS) 100 MG tablet [id number] No Take 1 tablet (100 mg total) by mouth 2 (two) times daily. Sarah Donovan, PA-C Past Week Active Self  fenofibrate (TRICOR) 145 MG tablet [id number] No Take 1 tablet (145 mg total) by mouth daily. Sarah Donovan, PA-C Past Week Active Self  FLUoxetine (PROZAC) 20 MG capsule [id number] No TAKE 1 CAPSULE BY MOUTH EVERY DAY  Patient taking differently: Take 20 mg by mouth daily.   Sarah Donovan, PA-C Past Week Active Self  gabapentin (NEURONTIN) 300 MG capsule [id number] No TAKE ONE CAPSULE BY MOUTH twice daily  Patient taking differently: Take 300 mg by mouth 2 (two) times daily.   Charlott Rakes, MD Past Week Active Self  glucose blood (ACCU-CHEK GUIDE) test strip [id number] No USE AS DIRECTED  Patient taking differently: 1 each by Other route as directed.   Sarah Charles, [place] 11/13/2020 Active Self  insulin glargine (LANTUS SOLOSTAR) 100 UNIT/ML Solostar  Pen [id number] No Inject 60 Units into the skin at bedtime.  Patient taking differently: Inject 55 Units into the skin at bedtime.   Sarah Donovan, PA-C Past Week Active Self  Insulin Pen Needle (B-D UF III MINI PEN NEEDLES) 31G X 5 MM MISC [id number] No USE AS DIRECTED  Patient taking differently: 1 each by Other route as directed.   Sarah Donovan, PA-C  Past Week Active Self  Lancets Misc. (ACCU-CHEK FASTCLIX LANCET) KIT [id number] No USE AS DIRECTED  Patient taking differently: 1 each by Other route as directed.   Charlott Rakes, MD 11/13/2020 Active Self  liraglutide (VICTOZA) 18 MG/3ML SOPN [id number] No Sq 1.94m once a day  Patient taking differently: Inject 1.2 mg into the skin See admin instructions. Sq 1.243monce a day   Sarah [place], [place] Active Self  lisinopril (ZESTRIL) 20 MG tablet [id number] Take 1 tablet (20 mg total) by mouth daily. McArgentina DonovanPA-C Past Week Active Self  meloxicam (MOBIC) 7.5 MG tablet [id number] Take 1 tablet (7.5 mg total) by mouth daily. NeCharlott RakesMD Past Week Active Self  methocarbamol (ROBAXIN) 500 MG tablet [id number] Take 1 tablet (500 mg total) by mouth 2 (two) times daily as needed (back pain).  Patient not taking: No sig reported   [place]ina DonovanPA[place] Taking Active Self  metoCLOPramide (REGLAN) 10 MG tablet [id number] TAKE 1 TABLET BY MOUTH EVERY EIGHT HOURS AS NEEDED FOR NAUSEA OR VOMITING.  Patient taking differently: Take 10 mg by mouth every 8 (eight) hours as needed for nausea or vomiting. TAKE 1 TABLET BY MOUTH EVERY EIGHT HOURS AS NEEDED FOR NAUSEA OR VOMITING.   Sarah Caldron, PA-C unk Active Self  metoprolol tartrate (LOPRESSOR) 25 MG tablet [id number] Take 1 tablet (25 mg total) by mouth 2 (two) times daily. [place]Ar[place]ina DonovanPAV[place]ontast Week Active Self  Multiple Vitamin (MULTIVITAMIN) tablet [id number] Take 1 tablet by mouth daily. [provider] Past Week Active Self  mupirocin ointment (BACTROBAN) 2 % [id number] Apply 1 application topically 2 (two) times daily. X 7 days at the first sign of skin infection  Patient not taking: No sig reported   McArgentina DonovanPA-C Completed Course Active Self  nitroGLYCERIN (NITROSTAT) 0.4 MG SL tablet [id number] Place 0.4 mg under the tongue every 5 (five) minutes as needed for chest pain.  [provider] unk Active Self  pantoprazole (PROTONIX) 40 MG tablet [id number] Take 1 tablet (40 mg total) by mouth daily. McArgentina DonovanPA-C Past Week Active Self  rosuvastatin (CRESTOR) 40 MG tablet [id number] Take 1 tablet (40 mg total) by mouth daily. McArgentina DonovanPA-C Past Week Active Self  sitaGLIPtin-metformin (JANUMET) 50-1000 MG tablet [id number] Take 1 tablet by mouth 2 (two) times daily with a meal. Mc[place]asa SoloAnDionne BucyPA-C Past Week Active Self  traZODone (DESYREL) 50 MG tablet [id number] Take 1 tablet (50 mg total) by mouth at bedtime as needed. for sleep McArgentina DonovanPA-C Past Week Active Self  vitamin B-12 (CYANOCOBALAMIN) 500 MCG tablet [id number] Take 1 tablet (500 mcg total) by mouth daily. McArgentina DonovanPA-C Past Week Active Self  vitamin C (ASCORBIC ACID) 500 MG tablet [id number] Take 500 mg by mouth daily. [provider] Past Week Active Self  Med List Note (BLoetta Rough7/31/16 197948 [facility]  Patient Active Problem List   Diagnosis Date Noted   CAD (coronary artery disease) 11/22/2017   Chest pain, precordial 11/04/2017   Acute chest pain    Type 2 diabetes mellitus with hyperlipidemia ([place]) 10/29/2017   Unstable angina ([place]) 10/29/2017   Diabetic polyneuropathy associated with type 2 diabetes mellitus ([place]) 05/30/2017   Mixed dyslipidemia 05/30/2017   Depression 05/31/2016   Hypertension 05/31/2016   Uncontrolled type 2 diabetes mellitus with complication ([place]) 17/91/5056   Uncontrolled type 2 diabetes mellitus with complication, with long-term current use of insulin ([place]) 04/20/2015   Cellulitis of breast 12/29/2013   Diabetes mellitus ([place]) 12/29/2013   Cellulitis of female breast 12/29/2013   Symptomatic menopausal or female climacteric states 04/08/2008   OTHER SPECIFIED DISEASE OF NAIL 04/08/2008   URINARY URGENCY 04/08/2008    COUGH 04/02/2008   HYPERTENSION, BENIGN ESSENTIAL 04/12/2007   FATIGUE 04/12/2007   OBESITY, NOS 07/21/2006   Major depressive disorder, recurrent episode ([place]) 07/21/2006   TOBACCO DEPENDENCE 07/21/2006   GASTROESOPHAGEAL REFLUX, NO ESOPHAGITIS 07/21/2006   INCONTINENCE, STRESS, FEMALE 07/21/2006    Conditions to be addressed/monitored per PCP order:   chronic healthcare management needs, DM2, HTN, GERD, sleep apnea, tobacco use. CAD, arthritis,   Care Plan : General Plan of Care (Adult)  Updates made by Gayla Medicus, RN since 11/19/2020 12:00 AM     Problem: Health Promotion or Disease Self-Management (General Plan of Care)   Priority: High  Onset Date: 06/10/2020     Long-Range Goal: Self-Management Plan Developed   Start Date: 06/10/2020  Expected End Date: 01/29/2021  Recent Progress: Not on track  Priority: High  Note:   Current Barriers:  Chronic Disease Management support and education needs. Patient needs blood pressure monitoring kit, CPAP, dietary referral as well as GI, Ophthamology, Cardiology, and Nephrology  Nurse Case Manager Clinical Goal(s):  Over the next 30 days, patient will attend all scheduled medical appointments: Over the next 30 days, patient will meet with dietician. Over the next 30 days, patient will work with CM team pharmacist to review medications. Update 07/11/20: Patient met with Pharmacist 07/01/20 and continues to follow.  Interventions:  Inter-disciplinary care team collaboration (see longitudinal plan of care) Reviewed medications with patient. Collaborated with pharmacy regarding medication review. Discussed plans with patient for ongoing care management follow up and provided patient with direct contact information for care management team Reviewed scheduled/upcoming provider appointments. Pharmacy referral for medication review. Care guide referral for Ophthalmology practices that accept her insurance. Collaborated with PCP for  dietary referral. Update 11/19/20:  Patient states she has not heard anything about dietary referral-encouraged patient to follow up with PCP. RNCM will follow up with PCP regarding blood pressure monitoring kit and CPAP.   Update 10/29/20:  Patient states PCP is ordering blood pressure monitoring kit.  Patient to have sleep study. Update 11/19/20:  Sleep study scheduled 11/26/20.  Patient states she will pick up blood pressure monitoring kit 11/21/20.  Has not scheduled GI or nephrology appts yet.  Patient Goals/Self-Care Activities Over the next 30 days, patient will:  -Self administers medications as prescribed Attends all scheduled provider appointments Calls pharmacy for medication refills Calls provider office for new concerns or questions  Follow Up Plan: The Managed Medicaid care management team will reach out to the patient again over the next 30 days.  The patient has been provided with contact information for the Managed Medicaid care management team and has been advised to call with any health related  questions or concerns.     Follow Up:  Patient agrees to Care Plan and Follow-up.  Plan: The Managed Medicaid care management team will reach out to the patient again over the next 30 days. and The patient has been provided with contact information for the Managed Medicaid care management team and has been advised to call with any health related questions or concerns.  Date/time of next scheduled RN care management/care coordination outreach: 12/22/20 at 0900.

## 2020-11-19 NOTE — Patient Instructions (Signed)
Visit Information  Sarah Charles was given information about Medicaid Managed Care team care coordination services as a part of their [place] Medicaid benefit. Sarah Charles verbally consented to engagement with the [facility] Managed Care team.   For questions related to your [facility], please call: [phone] or visit the homepage here: [email or web address]  If you would like to schedule transportation through your [facility], please call the following number at least 2 days in advance of your appointment: [phone].   Call the [place] at [phone], at any time, 24 hours a day, 7 days a week. If you are in danger or need immediate medical attention call 911.  Sarah Charles - following are the goals we discussed in your visit today:   Goals Addressed             This Visit's Progress    Protect My Health       Timeframe:  Long-Range Goal Priority:  High Start Date:      06/10/20                       Expected End Date:   ongoing               Follow Up Date:  12/19/20   - schedule appointment for vaccines needed due to my age or health - schedule recommended health tests (blood work, mammogram, colonoscopy, pap test) - schedule and keep appointment for annual check-up   Update 10/29/20:  Patient seen by PCP and recommended follow up appointments with GI for colonoscopy, Opthamologist for eye exam, Cardiologist and Nephrologist-awaiting referrals. Update 11/19/20:  Patient states she has Cardiology appointment, but no other appointments yet.  Asked for help with Ophthalmology appointment-will make referral.   Why is this important?   Screening tests can find diseases early when they are easier to treat.  Your doctor or nurse will talk with you about which tests are important for you.  Getting shots for common diseases like the flu and  shingles will help prevent them.      Patient verbalizes understanding of instructions provided today.   The Managed Medicaid care management team will reach out to the patient again over the next 30 days.  The patient has been provided with contact information for the Managed Medicaid care management team and has been advised to call with any health related questions or concerns.   Aida Raider RN, BSN [place]  Triad Curator - Managed Medicaid High Risk [phone].    Following is a copy of your plan of care:   Patient Care Plan: General Plan of Care (Adult)     Problem Identified: Health Promotion or Disease Self-Management (General Plan of Care)   Priority: High  Onset Date: 06/10/2020     Long-Range Goal: Self-Management Plan Developed   Start Date: 06/10/2020  Expected End Date: 01/29/2021  Recent Progress: Not on track  Priority: High  Note:   Current Barriers:  Chronic Disease Management support and education needs. Patient needs blood pressure monitoring kit, CPAP, dietary referral as well as GI, Ophthamology, Cardiology, and Nephrology  Nurse Case Manager Clinical Goal(s):  Over the next 30 days, patient will attend all scheduled medical appointments: Over the next 30 days, patient will meet with dietician. Over the next 30 days, patient will work with CM team pharmacist to review medications. Update 07/11/20: Patient met with  Pharmacist 07/01/20 and continues to follow.  Interventions:  Inter-disciplinary care team collaboration (see longitudinal plan of care) Reviewed medications with patient. Collaborated with pharmacy regarding medication review. Discussed plans with patient for ongoing care management follow up and provided patient with direct contact information for care management team Reviewed scheduled/upcoming provider appointments. Pharmacy referral for medication review. Care guide referral for Ophthalmology  practices that accept her insurance. Collaborated with PCP for dietary referral. Update 11/19/20:  Patient states she has not heard anything about dietary referral-encouraged patient to follow up with PCP. RNCM will follow up with PCP regarding blood pressure monitoring kit and CPAP.   Update 10/29/20:  Patient states PCP is ordering blood pressure monitoring kit.  Patient to have sleep study. Update 11/19/20:  Sleep study scheduled 11/26/20.  Patient states she will pick up blood pressure monitoring kit 11/21/20.  Has not scheduled GI or nephrology appts yet.  Patient Goals/Self-Care Activities Over the next 30 days, patient will:  -Self administers medications as prescribed Attends all scheduled provider appointments Calls pharmacy for medication refills Calls provider office for new concerns or questions  Follow Up Plan: The Managed Medicaid care management team will reach out to the patient again over the next 30 days.  The patient has been provided with contact information for the Managed Medicaid care management team and has been advised to call with any health related questions or concerns.

## 2020-11-26 ENCOUNTER — Telehealth: Payer: Self-pay | Admitting: Family Medicine

## 2020-11-26 ENCOUNTER — Ambulatory Visit (HOSPITAL_BASED_OUTPATIENT_CLINIC_OR_DEPARTMENT_OTHER): Payer: Medicaid Other | Attending: Physician Assistant | Admitting: Internal Medicine

## 2020-11-26 ENCOUNTER — Other Ambulatory Visit: Payer: Self-pay

## 2020-11-26 VITALS — Ht 63.0 in | Wt 169.0 lb

## 2020-11-26 DIAGNOSIS — R0683 Snoring: Secondary | ICD-10-CM | POA: Insufficient documentation

## 2020-11-26 DIAGNOSIS — G4730 Sleep apnea, unspecified: Secondary | ICD-10-CM | POA: Diagnosis present

## 2020-11-26 NOTE — Telephone Encounter (Signed)
   Telephone encounter was:  Successful.  11/26/2020 Name: Sarah Charles MRN: [id number] DOB: April 22, 1961  Sarah Charles is a 60 y.o. year old female who is a primary care patient of [name], MD . The community resource team was consulted for assistance with  finding Opthamologists who take Medicaid. Care guide performed the following interventions: Patient provided with information about care guide support team and interviewed to confirm resource needs   Follow Up Plan:  Care guide will follow up with patient by phone over the next day.  April Green Care Guide, Embedded Care Coordination [facility], Care Management Phone: [phone] Email: [email or web address]

## 2020-11-27 ENCOUNTER — Other Ambulatory Visit (HOSPITAL_BASED_OUTPATIENT_CLINIC_OR_DEPARTMENT_OTHER): Payer: Self-pay

## 2020-11-27 DIAGNOSIS — G4730 Sleep apnea, unspecified: Secondary | ICD-10-CM

## 2020-11-28 ENCOUNTER — Other Ambulatory Visit: Payer: Self-pay

## 2020-11-28 ENCOUNTER — Ambulatory Visit (INDEPENDENT_AMBULATORY_CARE_PROVIDER_SITE_OTHER): Payer: Medicaid Other | Admitting: Podiatry

## 2020-11-28 DIAGNOSIS — B353 Tinea pedis: Secondary | ICD-10-CM

## 2020-11-28 DIAGNOSIS — M79674 Pain in right toe(s): Secondary | ICD-10-CM

## 2020-11-28 DIAGNOSIS — B351 Tinea unguium: Secondary | ICD-10-CM | POA: Diagnosis not present

## 2020-11-28 DIAGNOSIS — E1142 Type 2 diabetes mellitus with diabetic polyneuropathy: Secondary | ICD-10-CM

## 2020-11-28 DIAGNOSIS — M79675 Pain in left toe(s): Secondary | ICD-10-CM | POA: Diagnosis not present

## 2020-11-28 MED ORDER — CLOTRIMAZOLE-BETAMETHASONE 1-0.05 % EX CREA
1.0000 | TOPICAL_CREAM | Freq: Two times a day (BID) | CUTANEOUS | 0 refills | Status: DC
Start: 2020-11-28 — End: 2022-02-25

## 2020-12-02 ENCOUNTER — Encounter: Payer: Self-pay | Admitting: Gastroenterology

## 2020-12-03 ENCOUNTER — Encounter: Payer: Self-pay | Admitting: Podiatry

## 2020-12-03 NOTE — Progress Notes (Signed)
  Subjective:  Patient ID: Sarah Charles, female    DOB: 18-May-1961,  MRN: [id number]  Chief Complaint  Patient presents with   Nail Problem    Nail trim    60 y.o. female returns for the above complaint.  Patient presents with thickened elongated dystrophic toenails x10.  Patient states that she is a diabetic that has been going on for 2 to 3 years.  She does not know her last A1c.  There is no swelling or coldness associated.  They are painful to touch.  She like to have them debrided down.  She also has secondary complaint of bilateral athlete's foot.  Patient states that there is itching associated with this she has tried some over-the-counter medication none of which has helped.  She denies any other acute complaint she would like to discuss prescription medication.  Objective:  There were no vitals filed for this visit. Podiatric Exam: Vascular: dorsalis pedis and posterior tibial pulses are palpable bilateral. Capillary return is immediate. Temperature gradient is WNL. Skin turgor WNL  Sensorium: Normal Semmes Weinstein monofilament test. Normal tactile sensation bilaterally. Nail Exam: Pt has thick disfigured discolored nails with subungual debris noted bilateral entire nail hallux through fifth toenails.  Pain on palpation to the nails. Ulcer Exam: There is no evidence of ulcer or pre-ulcerative changes or infection. Orthopedic Exam: Muscle tone and strength are WNL. No limitations in general ROM. No crepitus or effusions noted. HAV  B/L.  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 Skin: No Porokeratosis. No infection or ulcers.  Epidermal lysis with subjective component of itching noted.  No open wounds or lesions noted.    Assessment & Plan:   1. Tinea pedis of both feet   2. Pain due to onychomycosis of toenails of both feet   3. Diabetic polyneuropathy associated with type 2 diabetes mellitus (HCC)      Patient was evaluated and treated and all questions answered.  Bilateral athlete's foot -I  explained to patient the etiology of athlete's foot and various treatment options were extensively discussed.  Given the amount of athlete's foot that is present I believe patient would benefit from prescription lotion.  Lotrisone cream was sent to the pharmacy have asked her to apply twice a day to the bottom of the foot and affected area.  Patient states understanding will do so immediately.  Onychomycosis with pain  -Nails palliatively debrided as below. -Educated on self-care  Procedure: Nail Debridement Rationale: pain  Type of Debridement: manual, [place] debridement. Instrumentation: Nail nipper, rotary burr. Number of Nails: 10  Procedures and Treatment: Consent by patient was obtained for treatment procedures. The patient understood the discussion of treatment and procedures well. All questions were answered thoroughly reviewed. Debridement of mycotic and hypertrophic toenails, 1 through 5 bilateral and clearing of subungual debris. No ulceration, no infection noted.  Return Visit-Office Procedure: Patient instructed to return to the office for a follow up visit 3 months for continued evaluation and treatment.  Nicholes Rough, DPM    No follow-ups on file.

## 2020-12-07 DIAGNOSIS — G4730 Sleep apnea, unspecified: Secondary | ICD-10-CM

## 2020-12-07 NOTE — Procedures (Signed)
    Patient Name: Sarah Charles, Sarah Charles Date: 11/26/2020 Gender: Female D.O.B: June 06, 1960 Age (years): 60 Referring Provider: Anders Simmonds PA-C Height (inches): 63 Interpreting Physician: Jetty Duhamel MD, ABSM Weight (lbs): 169 RPSGT: Shelah Lewandowsky BMI: 30 MRN: [id number] Neck Size: 13.50  CLINICAL INFORMATION Sleep Study Type: NPSG Indication for sleep study: Diabetes, Excessive Daytime Sleepiness, Fatigue, Hypertension, Morning Headaches, OSA, Snoring Epworth Sleepiness Score: 13  SLEEP STUDY TECHNIQUE As per the AASM Manual for the Scoring of Sleep and Associated Events v2.3 (April 2016) with a hypopnea requiring 4% desaturations.  The channels recorded and monitored were frontal, central and occipital EEG, electrooculogram (EOG), submentalis EMG (chin), nasal and oral airflow, thoracic and abdominal wall motion, anterior tibialis EMG, snore microphone, electrocardiogram, and pulse oximetry.  MEDICATIONS Medications self-administered by patient taken the night of the study : none reported  SLEEP ARCHITECTURE The study was initiated at 10:10:37 PM and ended at 4:45:17 AM.  Sleep onset time was 16.6 minutes and the sleep efficiency was 75.6%%. The total sleep time was 298.5 minutes.  Stage REM latency was 72.5 minutes.  The patient spent 12.7%% of the night in stage N1 sleep, 70.2%% in stage N2 sleep, 0.0%% in stage N3 and 17.1% in REM.  Alpha intrusion was absent.  Supine sleep was 18.26%.  RESPIRATORY PARAMETERS The overall apnea/hypopnea index (AHI) was 3.0 per hour. There were 0 total apneas, including 0 obstructive, 0 central and 0 mixed apneas. There were 15 hypopneas and 4 RERAs.  The AHI during Stage REM sleep was 1.2 per hour.  AHI while supine was 6.6 per hour.  The mean oxygen saturation was 92.6%. The minimum SpO2 during sleep was 87.0%.  moderate snoring was noted during this study.  CARDIAC DATA The 2 lead EKG demonstrated sinus rhythm. The mean  heart rate was 66.8 beats per minute. Other EKG findings include: None.  LEG MOVEMENT DATA The total PLMS were 0 with a resulting PLMS index of 0.0. Associated arousal with leg movement index was 0.0 .  IMPRESSIONS - No significant obstructive sleep apnea occurred during this study (AHI = 3.0/h). - Mild oxygen desaturation was noted during this study (Min O2 = 87.0%). Mean O2 saturation 92.6%. - The patient snored with moderate snoring volume. - No cardiac abnormalities were noted during this study. - Clinically significant periodic limb movements did not occur during sleep. No significant associated arousals.  DIAGNOSIS - Primary Snoring  RECOMMENDATIONS - Manage for snoring and symptoms based on clinical judgment.. - Sleep hygiene should be reviewed to assess factors that may improve sleep quality. - Weight management and regular exercise should be initiated or continued if appropriate.  [Electronically signed] 12/07/2020 12:13 PM  Jetty Duhamel MD, ABSM Diplomate, American Board of Sleep Medicine   NPI: [id number]                         Jetty Duhamel Diplomate, American Board of Sleep Medicine  ELECTRONICALLY SIGNED ON:  12/07/2020, 12:11 PM [facility] PH: [phone]   FX: [phone] ACCREDITED BY THE AMERICAN ACADEMY OF SLEEP MEDICINE

## 2020-12-11 ENCOUNTER — Ambulatory Visit: Payer: Medicaid Other

## 2020-12-12 ENCOUNTER — Other Ambulatory Visit: Payer: Self-pay | Admitting: Family Medicine

## 2020-12-14 ENCOUNTER — Other Ambulatory Visit: Payer: Self-pay | Admitting: Physician Assistant

## 2020-12-14 DIAGNOSIS — F32A Depression, unspecified: Secondary | ICD-10-CM

## 2020-12-14 NOTE — Telephone Encounter (Signed)
Requested Prescriptions  Pending Prescriptions Disp Refills  . traZODone (DESYREL) 50 MG tablet [Pharmacy Med Name: trazodone 50 mg tablet] 30 tablet 2    Sig: TAKE ONE TABLET BY MOUTH EVERYDAY AT BEDTIME FOR SLEEP     Psychiatry: Antidepressants - Serotonin Modulator Passed - 12/14/2020  8:00 AM      Passed - Completed PHQ-2 or PHQ-9 in the last 360 days      Passed - Valid encounter within last 6 months    Recent Outpatient Visits          1 month ago Uncontrolled type 2 diabetes mellitus with complication, with long-term current use of insulin [facility])   [facility] [address] [facility] [place], [place], [place]   1 month ago Other fatigue   [facility] [place], [place], [place]   3 months ago Low back pain without sciatica, unspecified back pain laterality, unspecified chronicity   [facility] [address] [facility] [place], [place], [place]   4 months ago Uncontrolled type 2 diabetes mellitus with complication, with long-term current use of insulin [facility])   [facility] [place], [place], [place]   9 months ago Type 2 diabetes mellitus with hyperglycemia, with long-term current use of insulin [facility]   [facility] [name], MD      Future Appointments            In 1 week [name], MD [facility]   In 1 month [place], Tarri Abernethy, PA-C [facility], [facility]   In 4 months Shari Prows, Kathlynn Grate, MD [facility] [employer], [facility]

## 2020-12-16 ENCOUNTER — Other Ambulatory Visit: Payer: Self-pay

## 2020-12-17 ENCOUNTER — Telehealth: Payer: Self-pay | Admitting: Family Medicine

## 2020-12-17 NOTE — Telephone Encounter (Signed)
   Telephone encounter was:  Successful.  12/17/2020 Name: Sarah Charles MRN: [id number] DOB: [birth year]  Sarah Charles is a 60 y.o. year old female who is a primary care patient of [name], MD . The community resource team was consulted for assistance with  finding an Ophthalmology provider who accepts Medicaid.  Care guide performed the following interventions: Patient provided with information about care guide support team and interviewed to confirm resource needs. Mailing patient list of Opthalmology providers.  Follow Up Plan:  No further follow up planned at this time. The patient has been provided with needed resources.  April Green Care Guide, Embedded Care Coordination [facility], Care Management Phone: [phone] Email: [email or web address]

## 2020-12-17 NOTE — Telephone Encounter (Signed)
   Telephone encounter was:  Successful.  12/17/2020 Name: Sarah Charles MRN: [id number] DOB: 01/22/1961  [name] is a 60 y.o. year old female who is a primary care patient of [place], Enobong, MD . The community resource team was consulted for assistance with  finding an Ophthalmology provider who accepts Medicaid.  Care guide performed the following interventions: Patient provided with information about care guide support team and interviewed to confirm resource needs. Mailing patient list of Opthalmology providers.  Follow Up Plan:  No further follow up planned at this time. The patient has been provided with needed resources.  April Green Care Guide, Embedded Care Coordination [place], Care Management Phone: [phone] Email: [email or web address]   

## 2020-12-17 NOTE — Patient Outreach (Signed)
Per Patient, has 3 weeks left of meds  Will call for refills in 2 weeks to coordinate

## 2020-12-22 ENCOUNTER — Other Ambulatory Visit: Payer: Self-pay

## 2020-12-22 ENCOUNTER — Other Ambulatory Visit: Payer: Self-pay | Admitting: Obstetrics and Gynecology

## 2020-12-22 NOTE — Patient Instructions (Signed)
Visit Information  Ms. Imel was given information about Medicaid Managed Care team care coordination services as a part of their [place] Medicaid benefit. [name] verbally consented to engagement with the [facility] Managed Care team.   If you are experiencing a medical emergency, please call 911 or report to your local emergency department or urgent care.   If you have a non-emergency medical problem during routine business hours, please contact your provider's office and ask to speak with a nurse.   For questions related to your [facility], please call: [phone] or visit the homepage here: [email or web address]  If you would like to schedule transportation through your [facility], please call the following number at least 2 days in advance of your appointment: [phone].   Call the [place] at [phone], at any time, 24 hours a day, 7 days a week. If you are in danger or need immediate medical attention call 911.  If you would like help to quit smoking, call 1-800-QUIT-NOW 731-199-9969) OR Espaol: 1-855-Djelo-Ya (2-355-732-2025) o para ms informacin haga clic aqu or Text READY to 200-400 to register via text  Ms. Hervey Ard - following are the goals we discussed in your visit today:   Goals Addressed             This Visit's Progress    Protect My Health       Timeframe:  Long-Range Goal Priority:  High Start Date:      06/10/20                       Expected End Date:   ongoing               Follow Up Date: 01/22/21   - schedule appointment for vaccines needed due to my age or health - schedule recommended health tests (blood work, mammogram, colonoscopy, pap test) - schedule and keep appointment for annual check-up   Update 10/29/20:  Patient seen by PCP and recommended follow up appointments with GI for colonoscopy,  Opthamologist for eye exam, Cardiologist and Nephrologist-awaiting referrals. Update 11/19/20:  Patient states she has Cardiology appointment, but no other appointments yet.  Asked for help with Ophthalmology appointment-will make referral. Update 12/22/20:  Patient with upcoming PCP appt, 8/1, and GI appt, 01/13/21.  Ophthalmology provider information mailed to patient at her request.   Why is this important?   Screening tests can find diseases early when they are easier to treat.  Your doctor or nurse will talk with you about which tests are important for you.  Getting shots for common diseases like the flu and shingles will help prevent them.       Patient verbalizes understanding of instructions provided today.   The Managed Medicaid care management team will reach out to the patient again over the next 30 days.  The  Patient has been provided with contact information for the Managed Medicaid care management team and has been advised to call with any health related questions or concerns.   Aida Raider RN, BSN [place]  Triad Curator - Managed Medicaid High Risk [phone].    Following is a copy of your plan of care:  Patient Care Plan: Wellness (Adult)     Problem Identified: Health Literacy (Wellness)   Priority: High  Onset Date: 06/10/2020     Patient Care Plan: General Plan of Care (Adult)  Problem Identified: Health Promotion or Disease Self-Management (General Plan of Care)   Priority: High  Onset Date: 06/10/2020     Long-Range Goal: Self-Management Plan Developed   Start Date: 06/10/2020  Expected End Date: 01/29/2021  This Visit's Progress: On track  Recent Progress: Not on track  Priority: Medium  Note:   Current Barriers:  Chronic Disease Management support and education needs. Patient has upcoming appointments with PCP and GI.  Ophthalmology appointment to be scheduled.  Nurse Case Manager Clinical Goal(s):   Over the next 30 days, patient will attend all scheduled medical appointments. Over the next 30 days, patient will work with CM team pharmacist to review medications. Update 07/11/20: Patient met with Pharmacist 07/01/20 and continues to follow.  Interventions:  Inter-disciplinary care team collaboration (see longitudinal plan of care) Reviewed medications with patient. Collaborated with pharmacy regarding medication review. Discussed plans with patient for ongoing care management follow up and provided patient with direct contact information for care management team Reviewed scheduled/upcoming provider appointments. Pharmacy referral for medication review. Care guide referral for Ophthalmology practices that accept her insurance. Update 12/22/20:  Patient spoke to Care guide and Ophthalmology information mailed to patient at her request. Collaborated with PCP for dietary referral. Update 11/19/20:  Patient states she has not heard anything about dietary referral-encouraged patient to follow up with PCP. Update 12/22/20:  Patient has appointment with PCP 12/24/20. RNCM will follow up with PCP regarding blood pressure monitoring kit and CPAP.   Update 10/29/20:  Patient states PCP is ordering blood pressure monitoring kit.  Patient to have sleep study. Update 11/19/20:  Sleep study scheduled 11/26/20.  Patient states she will pick up blood pressure monitoring kit 11/21/20.  Has not scheduled GI or nephrology appts yet. Update 12/22/20:  Sleep study WNL, GI appt. 01/13/21.  Patient Goals/Self-Care Activities Over the next 30 days, patient will:  -Self administers medications as prescribed Attends all scheduled provider appointments Calls pharmacy for medication refills Calls provider office for new concerns or questions  Follow Up Plan: The Managed Medicaid care management team will reach out to the patient again over the next 30 days.  The patient has been provided with contact information for the Managed  Medicaid care management team and has been advised to call with any health related questions or concerns.

## 2020-12-22 NOTE — Patient Outreach (Signed)
Medicaid Managed Care   Nurse Care Manager Note  12/22/2020 Name:  Sarah Charles MRN:  [id number] DOB:  Feb 04, 1961  Sarah Charles is an 60 y.o. year old female who is a primary patient of Sarah Rakes, MD.  The [facility] Managed Care Coordination team was consulted for assistance with:    Chronic healthcare management needs.  Ms. Boback was given information about Medicaid Managed Care Coordination team services today. Sarah Charles agreed to services and verbal consent obtained.  Engaged with patient by telephone for follow up visit in response to provider referral for case management and/or care coordination services.   Assessments/Interventions:  Review of past medical history, allergies, medications, health status, including review of consultants reports, laboratory and other test data, was performed as part of comprehensive evaluation and provision of chronic care management services.  SDOH (Social Determinants of Health) assessments and interventions performed: SDOH Interventions    Flowsheet Row Most Recent Value  SDOH Interventions   Food Insecurity Interventions Intervention Not Indicated  Financial Strain Interventions Intervention Not Indicated  Housing Interventions Intervention Not Indicated  Intimate Partner Violence Interventions Intervention Not Indicated  Physical Activity Interventions Patient Refused  Stress Interventions Intervention Not Indicated  Social Connections Interventions Intervention Not Indicated  Transportation Interventions Intervention Not Indicated       Care Plan  Allergies  Allergen Reactions   Cymbalta [Duloxetine Hcl] Nausea Only    Nausea, lack of therapeutic effect    Medications Reviewed Today     Reviewed by Gayla Medicus, RN (Registered Nurse) on 12/22/20 at [place] List Status: <None>   Medication Order Taking? Sig Documenting Provider Last Dose Status Informant  Accu-Chek FastClix Lancets MISC [id number]  USE TWICE DAILY TO check  BLOOD GLUCOSE AS DIRECTED Sarah Rakes, MD  Active   amLODipine (NORVASC) 5 MG tablet [id number] No Take 1 tablet (5 mg total) by mouth daily. Sarah Charles, [place] Past Week Expired 11/13/20 2359 Self  aspirin EC 81 MG tablet [id number] No Take 81 mg by mouth daily. [provider] Past Week Active Self  Blood Glucose Monitoring Suppl (ACCU-CHEK GUIDE ME) w/Device KIT [id number] No 1 each by Does not apply route 3 (three) times daily. Sarah Charles, [place] 11/13/2020 Active Self  buPROPion (WELLBUTRIN SR) 150 MG 12 hr tablet [id number] No Take 1 tablet (150 mg total) by mouth 2 (two) times daily. Sarah Donovan, PA-C Past Week Active Self  cetirizine (ZYRTEC) 10 MG tablet [id number] No TAKE 1 TABLET BY MOUTH EVERY DAY  Patient taking differently: Take 10 mg by mouth daily.   Sarah Rakes, MD Past Week Active Self  clopidogrel (PLAVIX) 75 MG tablet [id number] No Take 1 tablet (75 mg total) by mouth daily. Sarah Charles, [place] 11/13/2020 Active Self  clotrimazole-betamethasone (LOTRISONE) cream [id number]  Apply 1 application topically 2 (two) times daily. Sarah Charles, DPM  Active   dicyclomine (BENTYL) 10 MG capsule [id number] No Take 1 capsule by mouth daily. [provider] Past Week Active Self  doxycycline (VIBRA-TABS) 100 MG tablet [id number] No Take 1 tablet (100 mg total) by mouth 2 (two) times daily. Sarah Donovan, PA-C Past Week Active Self  fenofibrate (TRICOR) 145 MG tablet [id number] No Take 1 tablet (145 mg total) by mouth daily. Sarah Donovan, PA-C Past Week Active Self  FLUoxetine (PROZAC) 20 MG capsule [id number] No TAKE 1 CAPSULE BY MOUTH EVERY DAY  Patient taking differently: Take 20 mg by mouth daily.   Sarah Donovan,  PA-C Past Week Active Self  gabapentin (NEURONTIN) 300 MG capsule [id number] No TAKE ONE CAPSULE BY MOUTH twice daily  Patient taking differently: Take 300 mg by mouth 2 (two) times daily.   Sarah Rakes, MD Past Week Active  Self  glucose blood (ACCU-CHEK GUIDE) test strip [id number] No USE AS DIRECTED  Patient taking differently: 1 each by Other route as directed.   Sarah Charles, [place] 11/13/2020 Active Self  insulin glargine (LANTUS SOLOSTAR) 100 UNIT/ML Solostar Pen [id number] No Inject 60 Units into the skin at bedtime.  Patient taking differently: Inject 55 Units into the skin at bedtime.   Sarah Donovan, PA-C Past Week Active Self  Insulin Pen Needle (B-D UF III MINI PEN NEEDLES) 31G X 5 MM MISC [id number] No USE AS DIRECTED  Patient taking differently: 1 each by Other route as directed.   Sarah Donovan, PA-C Past Week Active Self  Lancets Misc. (ACCU-CHEK FASTCLIX LANCET) KIT [id number] No USE AS DIRECTED  Patient taking differently: 1 each by Other route as directed.   Sarah Rakes, MD 11/13/2020 Active Self  liraglutide (VICTOZA) 18 MG/3ML SOPN [id number] No Sq 1.84m once a day  Patient taking differently: Inject 1.2 mg into the skin See admin instructions. Sq 1.276monce a day   Sarah [place], [place] Week Active Self  lisinopril (ZESTRIL) 20 MG tablet [id number] Take 1 tablet (20 mg total) by mouth daily. McAr[place]ina DonovanPA-C Past Week Active Self  meloxicam (MOBIC) 7.5 MG tablet [id number] Take 1 tablet (7.5 mg total) by mouth daily. NeCharlott RakesMD Past Week Active Self  methocarbamol (ROBAXIN) 500 MG tablet [id number] Take 1 tablet (500 mg total) by mouth 2 (two) times daily as needed (back pain).  Patient not taking: No sig reported   [place] Taking Active Self  metoCLOPramide (REGLAN) 10 MG tablet [id number] TAKE 1 TABLET BY MOUTH EVERY EIGHT HOURS AS NEEDED FOR NAUSEA OR VOMITING.  Patient taking differently: Take 10 mg by mouth every 8 (eight) hours as needed for nausea or vomiting. TAKE 1 TABLET BY MOUTH EVERY EIGHT HOURS AS NEEDED FOR NAUSEA OR VOMITING.   Sarah Caldron, PA-C unk Active Self  metoprolol tartrate (LOPRESSOR) 25 MG tablet  [id number] Take 1 tablet (25 mg total) by mouth 2 (two) times daily. [place] Week Active Self  Multiple Vitamin (MULTIVITAMIN) tablet [id number] Take 1 tablet by mouth daily. [provider] Past Week Active Self  mupirocin ointment (BACTROBAN) 2 % [id number] Apply 1 application topically 2 (two) times daily. X 7 days at the first sign of skin infection  Patient not taking: No sig reported   McAr[place]ina DonovanPA-C Completed Course Active Self  nitroGLYCERIN (NITROSTAT) 0.4 MG SL tablet [id number] Place 0.4 mg under the tongue every 5 (five) minutes as needed for chest pain. [provider] unk Active Self  pantoprazole (PROTONIX) 40 MG tablet [id number] Take 1 tablet (40 mg total) by mouth daily. McAr[place]ina DonovanPA-C Past Week Active Self  rosuvastatin (CRESTOR) 40 MG tablet [id number] Take 1 tablet (40 mg total) by mouth daily. McAr[place]ina DonovanPA-C Past Week Active Self  sitaGLIPtin-metformin (JANUMET) 50-1000 MG tablet [id number] Take 1 tablet by mouth 2 (two) times daily with a meal. McArgentina DonovanPA-C Past Week Active Self  traZODone (DESYREL) 50 MG tablet [id number] ONE TABLET BY MOUTH EVERYDAY AT BEDTIME  FOR SLEEP Sarah Rakes, MD  Active   vitamin B-12 (CYANOCOBALAMIN) 500 MCG tablet [id number] No Take 1 tablet (500 mcg total) by mouth daily. Sarah Donovan, PA-C Past Week Active Self  vitamin C (ASCORBIC ACID) 500 MG tablet [id number] No Take 500 mg by mouth daily. [provider] Past Week Active Self  Med List Note Loetta Rough 12/22/14 7282): Patient receives medications from the [facility] [place]            Patient Active Problem List   Diagnosis Date Noted   Sleep apnea, unspecified 11/26/2020   CAD (coronary artery disease) 11/22/2017   Chest pain, precordial 11/04/2017   Acute chest pain    Type 2 diabetes mellitus with hyperlipidemia ([place]) 10/29/2017   Unstable angina  ([place]) 10/29/2017   Diabetic polyneuropathy associated with type 2 diabetes mellitus ([place]) 05/30/2017   Mixed dyslipidemia 05/30/2017   Depression 05/31/2016   Hypertension 05/31/2016   Uncontrolled type 2 diabetes mellitus with complication ([place]) 10/22/5613   Uncontrolled type 2 diabetes mellitus with complication, with long-term current use of insulin ([place]) 04/20/2015   Cellulitis of breast 12/29/2013   Diabetes mellitus ([place]) 12/29/2013   Cellulitis of female breast 12/29/2013   Symptomatic menopausal or female climacteric states 04/08/2008   OTHER SPECIFIED DISEASE OF NAIL 04/08/2008   URINARY URGENCY 04/08/2008   COUGH 04/02/2008   HYPERTENSION, BENIGN ESSENTIAL 04/12/2007   FATIGUE 04/12/2007   OBESITY, NOS 07/21/2006   Major depressive disorder, recurrent episode ([place]) 07/21/2006   TOBACCO DEPENDENCE 07/21/2006   GASTROESOPHAGEAL REFLUX, NO ESOPHAGITIS 07/21/2006   INCONTINENCE, STRESS, FEMALE 07/21/2006    Conditions to be addressed/monitored per PCP order:   chronic healthcare management needs, DM2, HTN, GERD, CAD, tobacco use, arthritis.  Care Plan : General Plan of Care (Adult)  Updates made by Gayla Medicus, RN since 12/22/2020 12:00 AM     Problem: Health Promotion or Disease Self-Management (General Plan of Care)   Priority: High  Onset Date: 06/10/2020     Long-Range Goal: Self-Management Plan Developed   Start Date: 06/10/2020  Expected End Date: 01/29/2021  This Visit's Progress: On track  Recent Progress: Not on track  Priority: Medium  Note:   Current Barriers:  Chronic Disease Management support and education needs. Patient has upcoming appointments with PCP and GI.  Ophthalmology appointment to be scheduled.  Nurse Case Manager Clinical Goal(s):  Over the next 30 days, patient will attend all scheduled medical appointments. Over the next 30 days, patient will work with CM team pharmacist to review medications. Update 07/11/20: Patient met with Pharmacist  07/01/20 and continues to follow.  Interventions:  Inter-disciplinary care team collaboration (see longitudinal plan of care) Reviewed medications with patient. Collaborated with pharmacy regarding medication review. Discussed plans with patient for ongoing care management follow up and provided patient with direct contact information for care management team Reviewed scheduled/upcoming provider appointments. Pharmacy referral for medication review. Care guide referral for Ophthalmology practices that accept her insurance. Update 12/22/20:  Patient spoke to Care guide and Ophthalmology information mailed to patient at her request. Collaborated with PCP for dietary referral. Update 11/19/20:  Patient states she has not heard anything about dietary referral-encouraged patient to follow up with PCP. Update 12/22/20:  Patient has appointment with PCP 12/24/20. RNCM will follow up with PCP regarding blood pressure monitoring kit and CPAP.   Update 10/29/20:  Patient states PCP is ordering blood pressure monitoring kit.  Patient to have sleep study. Update 11/19/20:  Sleep study scheduled 11/26/20.  Patient states she will pick up blood pressure monitoring kit 11/21/20.  Has not scheduled GI or nephrology appts yet. Update 12/22/20:  Sleep study WNL, GI appt. 01/13/21.  Patient Goals/Self-Care Activities Over the next 30 days, patient will:  -Self administers medications as prescribed Attends all scheduled provider appointments Calls pharmacy for medication refills Calls provider office for new concerns or questions  Follow Up Plan: The Managed Medicaid care management team will reach out to the patient again over the next 30 days.  The patient has been provided with contact information for the Managed Medicaid care management team and has been advised to call with any health related questions or concerns.     Follow Up:  Patient agrees to Care Plan and Follow-up.  Plan: The Managed Medicaid care management  team will reach out to the patient again over the next 30 days. and The patient has been provided with contact information for the Managed Medicaid care management team and has been advised to call with any health related questions or concerns.  Date/time of next scheduled RN care management/care coordination outreach:  01/22/21 at 0900.

## 2020-12-24 ENCOUNTER — Ambulatory Visit: Payer: Medicaid Other | Attending: Family Medicine | Admitting: Family Medicine

## 2020-12-24 ENCOUNTER — Other Ambulatory Visit (HOSPITAL_COMMUNITY)
Admission: RE | Admit: 2020-12-24 | Discharge: 2020-12-24 | Disposition: A | Discharge status: Home / Self Care | Payer: Medicaid Other | Source: Ambulatory Visit | Attending: Family Medicine | Admitting: Family Medicine

## 2020-12-24 ENCOUNTER — Encounter: Payer: Self-pay | Admitting: Family Medicine

## 2020-12-24 ENCOUNTER — Other Ambulatory Visit: Payer: Self-pay

## 2020-12-24 VITALS — BP 144/83 | HR 76 | Ht 63.0 in | Wt 157.4 lb

## 2020-12-24 DIAGNOSIS — Z79899 Other long term (current) drug therapy: Secondary | ICD-10-CM | POA: Insufficient documentation

## 2020-12-24 DIAGNOSIS — Z7901 Long term (current) use of anticoagulants: Secondary | ICD-10-CM | POA: Insufficient documentation

## 2020-12-24 DIAGNOSIS — Z0000 Encounter for general adult medical examination without abnormal findings: Secondary | ICD-10-CM | POA: Insufficient documentation

## 2020-12-24 DIAGNOSIS — Z955 Presence of coronary angioplasty implant and graft: Secondary | ICD-10-CM | POA: Diagnosis not present

## 2020-12-24 DIAGNOSIS — I10 Essential (primary) hypertension: Secondary | ICD-10-CM | POA: Insufficient documentation

## 2020-12-24 DIAGNOSIS — Z113 Encounter for screening for infections with a predominantly sexual mode of transmission: Secondary | ICD-10-CM | POA: Insufficient documentation

## 2020-12-24 DIAGNOSIS — E119 Type 2 diabetes mellitus without complications: Secondary | ICD-10-CM | POA: Insufficient documentation

## 2020-12-24 DIAGNOSIS — Z7182 Exercise counseling: Secondary | ICD-10-CM | POA: Diagnosis not present

## 2020-12-24 DIAGNOSIS — Z794 Long term (current) use of insulin: Secondary | ICD-10-CM | POA: Insufficient documentation

## 2020-12-24 DIAGNOSIS — I2510 Atherosclerotic heart disease of native coronary artery without angina pectoris: Secondary | ICD-10-CM | POA: Diagnosis not present

## 2020-12-24 DIAGNOSIS — Z7902 Long term (current) use of antithrombotics/antiplatelets: Secondary | ICD-10-CM | POA: Diagnosis not present

## 2020-12-24 DIAGNOSIS — E785 Hyperlipidemia, unspecified: Secondary | ICD-10-CM | POA: Insufficient documentation

## 2020-12-24 DIAGNOSIS — Z7982 Long term (current) use of aspirin: Secondary | ICD-10-CM | POA: Insufficient documentation

## 2020-12-24 DIAGNOSIS — Z791 Long term (current) use of non-steroidal anti-inflammatories (NSAID): Secondary | ICD-10-CM | POA: Diagnosis not present

## 2020-12-24 DIAGNOSIS — Z124 Encounter for screening for malignant neoplasm of cervix: Secondary | ICD-10-CM | POA: Insufficient documentation

## 2020-12-24 DIAGNOSIS — K219 Gastro-esophageal reflux disease without esophagitis: Secondary | ICD-10-CM | POA: Diagnosis not present

## 2020-12-24 NOTE — Progress Notes (Signed)
Subjective:  Patient ID: Sarah Charles, female    DOB: [birth year]  Age: 60 y.o. MRN: [id number]  CC: Annual Exam and Gynecologic Exam   HPI Noam Franzen is a 60 year old female with a history of type 2 diabetes mellitus (A1c 11.3), hypertension, hyperlipidemia, GERD, CAD (s/p DES).  Interval History: She presents for complete physical exam today. Colonoscopy is scheduled for  01/13/21. Mammogram in 04/2020 was normal She requests STD screen; due for Pap smear today.  Past Medical History:  Diagnosis Date   Arthritis    Coronary artery calcification seen on CT scan    a. 07/2017 noted on CT abd.   Depression    GERD (gastroesophageal reflux disease)    Hyperlipidemia    Hypertension    Obesity    Sleep apnea    a. Does not use CPAP "cause i dont think I need it anymore".   Tobacco abuse    Type II diabetes mellitus ([place])     Past Surgical History:  Procedure Laterality Date   CHOLECYSTECTOMY     CORONARY STENT INTERVENTION N/A 11/04/2017   Procedure: CORONARY STENT INTERVENTION;  Surgeon: Martinique, Peter M, MD;  Location: [facility];  Service: Cardiovascular;  Laterality: N/A;   INCISION AND DRAINAGE ABSCESS Left 12/22/2012   Procedure: INCISION AND DRAINAGE ABSCESS;  Surgeon: Jamesetta So, MD;  Location: [facility];  Service: General;  Laterality: Left;   INTRAVASCULAR PRESSURE WIRE/FFR STUDY N/A 11/04/2017   Procedure: INTRAVASCULAR PRESSURE WIRE/FFR STUDY;  Surgeon: Martinique, Peter M, MD;  Location: [facility];  Service: Cardiovascular;  Laterality: N/A;   LEFT HEART CATH AND CORONARY ANGIOGRAPHY N/A 11/04/2017   Procedure: LEFT HEART CATH AND CORONARY ANGIOGRAPHY;  Surgeon: Martinique, Peter M, MD;  Location: [facility];  Service: Cardiovascular;  Laterality: N/A;   TONSILLECTOMY     TUBAL LIGATION      Family History  Problem Relation Age of Onset   Hypertension Mother    CVA Mother    COPD Mother    Heart disease Mother    Kidney Stones Mother    Heart  attack Mother        MI in late 72's   Breast cancer Mother    CAD Father    Hypercholesterolemia Father    Heart attack Father        Died @ 81 of MI   Cancer Maternal Uncle    Diabetes Maternal Grandmother    Cancer Maternal Grandfather        lung cancer   Diabetes Sister     Allergies  Allergen Reactions   Cymbalta [Duloxetine Hcl] Nausea Only    Nausea, lack of therapeutic effect    Outpatient Medications Prior to Visit  Medication Sig Dispense Refill   Accu-Chek FastClix Lancets MISC USE TWICE DAILY TO check BLOOD GLUCOSE AS DIRECTED 102 each 0   aspirin EC 81 MG tablet Take 81 mg by mouth daily.     Blood Glucose Monitoring Suppl (ACCU-CHEK GUIDE ME) w/Device KIT 1 each by Does not apply route 3 (three) times daily. 1 kit 0   buPROPion (WELLBUTRIN SR) 150 MG 12 hr tablet Take 1 tablet (150 mg total) by mouth 2 (two) times daily. 180 tablet 1   cetirizine (ZYRTEC) 10 MG tablet TAKE 1 TABLET BY MOUTH EVERY DAY (Patient taking differently: Take 10 mg by mouth daily.) 30 tablet 2   clopidogrel (PLAVIX) 75 MG tablet Take 1 tablet (75 mg total)  by mouth daily. 90 tablet 1   clotrimazole-betamethasone (LOTRISONE) cream Apply 1 application topically 2 (two) times daily. 30 g 0   dicyclomine (BENTYL) 10 MG capsule Take 1 capsule by mouth daily.     fenofibrate (TRICOR) 145 MG tablet Take 1 tablet (145 mg total) by mouth daily. 90 tablet 3   FLUoxetine (PROZAC) 20 MG capsule TAKE 1 CAPSULE BY MOUTH EVERY DAY (Patient taking differently: Take 20 mg by mouth daily.) 90 capsule 1   gabapentin (NEURONTIN) 300 MG capsule TAKE ONE CAPSULE BY MOUTH twice daily (Patient taking differently: Take 300 mg by mouth 2 (two) times daily.) 60 capsule 2   glucose blood (ACCU-CHEK GUIDE) test strip USE AS DIRECTED (Patient taking differently: 1 each by Other route as directed.) 100 strip 2   insulin glargine (LANTUS SOLOSTAR) 100 UNIT/ML Solostar Pen Inject 60 Units into the skin at bedtime. (Patient  taking differently: Inject 55 Units into the skin at bedtime.) 30 mL 3   Insulin Pen Needle (B-D UF III MINI PEN NEEDLES) 31G X 5 MM MISC USE AS DIRECTED (Patient taking differently: 1 each by Other route as directed.) 100 each 3   Lancets Misc. (ACCU-CHEK FASTCLIX LANCET) KIT USE AS DIRECTED (Patient taking differently: 1 each by Other route as directed.) 1 kit 0   liraglutide (VICTOZA) 18 MG/3ML SOPN Sq 1.26m once a day (Patient taking differently: Inject 1.2 mg into the skin See admin instructions. Sq 1.220monce a day) 6 mL 3   lisinopril (ZESTRIL) 20 MG tablet Take 1 tablet (20 mg total) by mouth daily. 90 tablet 1   meloxicam (MOBIC) 7.5 MG tablet Take 1 tablet (7.5 mg total) by mouth daily. 10 tablet 0   metoCLOPramide (REGLAN) 10 MG tablet TAKE 1 TABLET BY MOUTH EVERY EIGHT HOURS AS NEEDED FOR NAUSEA OR VOMITING. (Patient taking differently: Take 10 mg by mouth every 8 (eight) hours as needed for nausea or vomiting. TAKE 1 TABLET BY MOUTH EVERY EIGHT HOURS AS NEEDED FOR NAUSEA OR VOMITING.) 40 tablet 0   metoprolol tartrate (LOPRESSOR) 25 MG tablet Take 1 tablet (25 mg total) by mouth 2 (two) times daily. 180 tablet 3   Multiple Vitamin (MULTIVITAMIN) tablet Take 1 tablet by mouth daily.     nitroGLYCERIN (NITROSTAT) 0.4 MG SL tablet Place 0.4 mg under the tongue every 5 (five) minutes as needed for chest pain.     pantoprazole (PROTONIX) 40 MG tablet Take 1 tablet (40 mg total) by mouth daily. 90 tablet 2   rosuvastatin (CRESTOR) 40 MG tablet Take 1 tablet (40 mg total) by mouth daily. 90 tablet 1   sitaGLIPtin-metformin (JANUMET) 50-1000 MG tablet Take 1 tablet by mouth 2 (two) times daily with a meal. 180 tablet 1   traZODone (DESYREL) 50 MG tablet TAKE ONE TABLET BY MOUTH EVERYDAY AT BEDTIME FOR SLEEP 30 tablet 0   vitamin B-12 (CYANOCOBALAMIN) 500 MCG tablet Take 1 tablet (500 mcg total) by mouth daily. 30 tablet 3   vitamin C (ASCORBIC ACID) 500 MG tablet Take 500 mg by mouth daily.      amLODipine (NORVASC) 5 MG tablet Take 1 tablet (5 mg total) by mouth daily. 180 tablet 1   doxycycline (VIBRA-TABS) 100 MG tablet Take 1 tablet (100 mg total) by mouth 2 (two) times daily. (Patient not taking: Reported on 12/24/2020) 20 tablet 0   methocarbamol (ROBAXIN) 500 MG tablet Take 1 tablet (500 mg total) by mouth 2 (two) times daily as needed (back pain). (  Patient not taking: No sig reported) 30 tablet 0   mupirocin ointment (BACTROBAN) 2 % Apply 1 application topically 2 (two) times daily. X 7 days at the first sign of skin infection (Patient not taking: No sig reported) 22 g 0   No facility-administered medications prior to visit.     ROS Review of Systems  Constitutional:  Negative for activity change, appetite change and fatigue.  HENT:  Negative for congestion, sinus pressure and sore throat.   Eyes:  Negative for visual disturbance.  Respiratory:  Negative for cough, chest tightness, shortness of breath and wheezing.   Cardiovascular:  Negative for chest pain and palpitations.  Gastrointestinal:  Negative for abdominal distention, abdominal pain and constipation.  Endocrine: Negative for polydipsia.  Genitourinary:  Negative for dysuria and frequency.  Musculoskeletal:  Negative for arthralgias and back pain.  Skin:  Negative for rash.  Neurological:  Negative for tremors, light-headedness and numbness.  Hematological:  Does not bruise/bleed easily.  Psychiatric/Behavioral:  Negative for agitation and behavioral problems.    Objective:  BP (!) 144/83   Pulse 76   Ht '5\' 3"'  (1.6 m)   Wt 157 lb 6.4 oz (71.4 kg)   SpO2 98%   BMI 27.88 kg/m   BP/Weight 12/24/2020 11/26/2020 09/07/3843  Systolic BP 364 - 680  Diastolic BP 83 - 77  Wt. (Lbs) 157.4 169 -  BMI 27.88 29.94 -      Physical Exam Exam conducted with a chaperone present.  Constitutional:      General: She is not in acute distress.    Appearance: She is well-developed. She is not diaphoretic.  HENT:      Head: Normocephalic.     Right Ear: External ear normal.     Left Ear: External ear normal.     Nose: Nose normal.  Eyes:     Conjunctiva/sclera: Conjunctivae normal.     Pupils: Pupils are equal, round, and reactive to light.  Neck:     Vascular: No JVD.  Cardiovascular:     Rate and Rhythm: Normal rate and regular rhythm.     Heart sounds: Normal heart sounds. No murmur heard.   No gallop.  Pulmonary:     Effort: Pulmonary effort is normal. No respiratory distress.     Breath sounds: Normal breath sounds. No wheezing or rales.  Chest:     Chest wall: No tenderness.  Breasts:    Right: Normal. No mass, nipple discharge, tenderness, axillary adenopathy or supraclavicular adenopathy.     Left: Normal. No mass, nipple discharge, tenderness, axillary adenopathy or supraclavicular adenopathy.  Abdominal:     General: Bowel sounds are normal. There is no distension.     Palpations: Abdomen is soft. There is no mass.     Tenderness: There is no abdominal tenderness.     Hernia: There is no hernia in the left inguinal area or right inguinal area.  Genitourinary:    General: Normal vulva.     Pubic Area: No rash.      Labia:        Right: No rash.        Left: No rash.      Vagina: Normal.     Cervix: Normal.     Uterus: Normal.      Adnexa: Right adnexa normal and left adnexa normal.       Right: No tenderness.         Left: No tenderness.    Musculoskeletal:  General: No tenderness. Normal range of motion.     Cervical back: Normal range of motion. No tenderness.  Lymphadenopathy:     Upper Body:     Right upper body: No supraclavicular or axillary adenopathy.     Left upper body: No supraclavicular or axillary adenopathy.  Skin:    General: Skin is warm and dry.  Neurological:     Mental Status: She is alert and oriented to person, place, and time.     Deep Tendon Reflexes: Reflexes are normal and symmetric.    CMP Latest Ref Rng & Units 11/13/2020 10/08/2020  08/14/2020  Glucose 70 - 99 mg/dL 417(H) 486(H) 239(H)  BUN 6 - 20 mg/dL 24(H) 10 16  Creatinine 0.44 - 1.00 mg/dL 0.69 0.56 0.50(L)  Sodium 135 - 145 mmol/L 132(L) 134(L) 137  Potassium 3.5 - 5.1 mmol/L 4.5 3.6 4.4  Chloride 98 - 111 mmol/L 97(L) 99 97  CO2 22 - 32 mmol/L '23 26 23  ' Calcium 8.9 - 10.3 mg/dL 10.0 9.4 9.5  Total Protein 6.5 - 8.1 g/dL 6.8 6.9 6.4  Total Bilirubin 0.3 - 1.2 mg/dL 1.2 0.5 <0.2  Alkaline Phos 38 - 126 U/L 107 96 124(H)  AST 15 - 41 U/L '23 15 12  ' ALT 0 - 44 U/L '6 15 10    ' Lipid Panel     Component Value Date/Time   CHOL 208 (H) 08/14/2020 1124   TRIG 187 (H) 08/14/2020 1124   HDL 53 08/14/2020 1124   CHOLHDL 3.9 08/14/2020 1124   CHOLHDL 9.7 (H) 07/23/2016 1638   VLDL NOT CALC 07/23/2016 1638   LDLCALC 122 (H) 08/14/2020 1124    CBC    Component Value Date/Time   WBC 7.1 11/13/2020 1230   RBC 5.15 (H) 11/13/2020 1230   HGB 15.9 (H) 11/13/2020 1230   HGB 14.2 11/09/2018 1158   HCT 46.7 (H) 11/13/2020 1230   HCT 42.8 11/09/2018 1158   PLT 305 11/13/2020 1230   PLT 321 11/09/2018 1158   MCV 90.7 11/13/2020 1230   MCV 93 11/09/2018 1158   MCH 30.9 11/13/2020 1230   MCHC 34.0 11/13/2020 1230   RDW 12.5 11/13/2020 1230   RDW 12.5 11/09/2018 1158   LYMPHSABS 2.6 11/13/2020 1230   LYMPHSABS 2.0 11/09/2018 1158   MONOABS 0.6 11/13/2020 1230   EOSABS 0.1 11/13/2020 1230   EOSABS 0.1 11/09/2018 1158   BASOSABS 0.1 11/13/2020 1230   BASOSABS 0.1 11/09/2018 1158    Lab Results  Component Value Date   HGBA1C 11.3 (A) 11/13/2020    Assessment & Plan:  1. Annual physical exam Counseled on 150 minutes of exercise per week, healthy eating (including decreased daily intake of saturated fats, cholesterol, added sugars, sodium), routine healthcare maintenance.   2. Screening for cervical cancer - Cytology - PAP([facility])  3. Screening for STD (sexually transmitted disease) - Cervicovaginal ancillary only    No orders of the defined  types were placed in this encounter.   Follow-up: Return in about 1 month (around 01/24/2021) for Diabetic visit.       Charlott Rakes, MD, FAAFP. [facility] and [place], [place]   12/24/2020, 10:20 AM

## 2020-12-24 NOTE — Patient Instructions (Signed)
Health Maintenance, Female Adopting a healthy lifestyle and getting preventive care are important in promoting health and wellness. Ask your health care provider about: The right schedule for you to have regular tests and exams. Things you can do on your own to prevent diseases and keep yourself healthy. What should I know about diet, weight, and exercise? Eat a healthy diet  Eat a diet that includes plenty of vegetables, fruits, low-fat dairy products, and lean protein. Do not eat a lot of foods that are high in solid fats, added sugars, or sodium.  Maintain a healthy weight Body mass index (BMI) is used to identify weight problems. It estimates body fat based on height and weight. Your health care provider can help determineyour BMI and help you achieve or maintain a healthy weight. Get regular exercise Get regular exercise. This is one of the most important things you can do for your health. Most adults should: Exercise for at least 150 minutes each week. The exercise should increase your heart rate and make you sweat (moderate-intensity exercise). Do strengthening exercises at least twice a week. This is in addition to the moderate-intensity exercise. Spend less time sitting. Even light physical activity can be beneficial. Watch cholesterol and blood lipids Have your blood tested for lipids and cholesterol at 60 years of age, then havethis test every 5 years. Have your cholesterol levels checked more often if: Your lipid or cholesterol levels are high. You are older than 60 years of age. You are at high risk for heart disease. What should I know about cancer screening? Depending on your health history and family history, you may need to have cancer screening at various ages. This may include screening for: Breast cancer. Cervical cancer. Colorectal cancer. Skin cancer. Lung cancer. What should I know about heart disease, diabetes, and high blood pressure? Blood pressure and heart  disease High blood pressure causes heart disease and increases the risk of stroke. This is more likely to develop in people who have high blood pressure readings, are of African descent, or are overweight. Have your blood pressure checked: Every 3-5 years if you are 18-39 years of age. Every year if you are 40 years old or older. Diabetes Have regular diabetes screenings. This checks your fasting blood sugar level. Have the screening done: Once every three years after age 40 if you are at a normal weight and have a low risk for diabetes. More often and at a younger age if you are overweight or have a high risk for diabetes. What should I know about preventing infection? Hepatitis B If you have a higher risk for hepatitis B, you should be screened for this virus. Talk with your health care provider to find out if you are at risk forhepatitis B infection. Hepatitis C Testing is recommended for: Everyone born from 1945 through 1965. Anyone with known risk factors for hepatitis C. Sexually transmitted infections (STIs) Get screened for STIs, including gonorrhea and chlamydia, if: You are sexually active and are younger than 60 years of age. You are older than 60 years of age and your health care provider tells you that you are at risk for this type of infection. Your sexual activity has changed since you were last screened, and you are at increased risk for chlamydia or gonorrhea. Ask your health care provider if you are at risk. Ask your health care provider about whether you are at high risk for HIV. Your health care provider may recommend a prescription medicine to help   prevent HIV infection. If you choose to take medicine to prevent HIV, you should first get tested for HIV. You should then be tested every 3 months for as long as you are taking the medicine. Pregnancy If you are about to stop having your period (premenopausal) and you may become pregnant, seek counseling before you get  pregnant. Take 400 to 800 micrograms (mcg) of folic acid every day if you become pregnant. Ask for birth control (contraception) if you want to prevent pregnancy. Osteoporosis and menopause Osteoporosis is a disease in which the bones lose minerals and strength with aging. This can result in bone fractures. If you are 65 years old or older, or if you are at risk for osteoporosis and fractures, ask your health care provider if you should: Be screened for bone loss. Take a calcium or vitamin D supplement to lower your risk of fractures. Be given hormone replacement therapy (HRT) to treat symptoms of menopause. Follow these instructions at home: Lifestyle Do not use any products that contain nicotine or tobacco, such as cigarettes, e-cigarettes, and chewing tobacco. If you need help quitting, ask your health care provider. Do not use street drugs. Do not share needles. Ask your health care provider for help if you need support or information about quitting drugs. Alcohol use Do not drink alcohol if: Your health care provider tells you not to drink. You are pregnant, may be pregnant, or are planning to become pregnant. If you drink alcohol: Limit how much you use to 0-1 drink a day. Limit intake if you are breastfeeding. Be aware of how much alcohol is in your drink. In the [place] one drink equals one 12 oz bottle of beer (355 mL), one 5 oz glass of wine (148 mL), or one 1 oz glass of hard liquor (44 mL). General instructions Schedule regular health, dental, and eye exams. Stay current with your vaccines. Tell your health care provider if: You often feel depressed. You have ever been abused or do not feel safe at home. Summary Adopting a healthy lifestyle and getting preventive care are important in promoting health and wellness. Follow your health care provider's instructions about healthy diet, exercising, and getting tested or screened for diseases. Follow your health care provider's  instructions on monitoring your cholesterol and blood pressure. This information is not intended to replace advice given to you by your health care provider. Make sure you discuss any questions you have with your healthcare provider. Document Revised: 05/03/2018 Document Reviewed: 05/03/2018 Elsevier Patient Education  2022 Elsevier Inc.  

## 2020-12-24 NOTE — Progress Notes (Signed)
Physical/Pap 

## 2020-12-25 LAB — CERVICOVAGINAL ANCILLARY ONLY
Bacterial Vaginitis (gardnerella): POSITIVE — AB
Candida Glabrata: POSITIVE — AB
Candida Vaginitis: NEGATIVE
Chlamydia: NEGATIVE
Comment: NEGATIVE
Comment: NEGATIVE
Comment: NEGATIVE
Comment: NEGATIVE
Comment: NEGATIVE
Comment: NORMAL
Neisseria Gonorrhea: NEGATIVE
Trichomonas: NEGATIVE

## 2020-12-26 ENCOUNTER — Emergency Department (HOSPITAL_COMMUNITY)
Admission: EM | Admit: 2020-12-26 | Discharge: 2020-12-27 | Disposition: A | Discharge status: Home / Self Care | Payer: Medicaid Other | Attending: Emergency Medicine | Admitting: Emergency Medicine

## 2020-12-26 ENCOUNTER — Encounter (HOSPITAL_COMMUNITY): Payer: Self-pay

## 2020-12-26 ENCOUNTER — Emergency Department (HOSPITAL_COMMUNITY): Payer: Medicaid Other

## 2020-12-26 ENCOUNTER — Other Ambulatory Visit: Payer: Self-pay

## 2020-12-26 DIAGNOSIS — M542 Cervicalgia: Secondary | ICD-10-CM | POA: Insufficient documentation

## 2020-12-26 DIAGNOSIS — R079 Chest pain, unspecified: Secondary | ICD-10-CM | POA: Diagnosis not present

## 2020-12-26 DIAGNOSIS — R0789 Other chest pain: Secondary | ICD-10-CM | POA: Diagnosis not present

## 2020-12-26 DIAGNOSIS — Z5321 Procedure and treatment not carried out due to patient leaving prior to being seen by health care provider: Secondary | ICD-10-CM | POA: Diagnosis not present

## 2020-12-26 DIAGNOSIS — Y92410 Unspecified street and highway as the place of occurrence of the external cause: Secondary | ICD-10-CM | POA: Diagnosis not present
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
## 2020-12-26 NOTE — ED Notes (Signed)
No answer for triage X 1.

## 2020-12-27 NOTE — ED Notes (Signed)
Registration stated this patient left

## 2020-12-28 LAB — CYTOLOGY - PAP
Comment: NEGATIVE
Diagnosis: NEGATIVE
High risk HPV: NEGATIVE

## 2020-12-29 ENCOUNTER — Telehealth: Payer: Self-pay

## 2020-12-29 ENCOUNTER — Other Ambulatory Visit: Payer: Self-pay | Admitting: Family Medicine

## 2020-12-29 MED ORDER — METRONIDAZOLE 0.75 % VA GEL: 1.0000 | Freq: Every day | VAGINAL | 0 refills | Status: DC

## 2020-12-29 MED ORDER — FLUCONAZOLE 150 MG PO TABS: 150.0000 mg | ORAL_TABLET | Freq: Once | ORAL | 0 refills | Status: AC

## 2020-12-29 NOTE — Telephone Encounter (Signed)
Transition Care Management Follow-up Telephone Call Date of discharge and from where: 12/27/2020-[facility]  How have you been since you were released from the hospital? Patient stated she is doing fine. Patient ELOPED Any questions or concerns? No  Items Reviewed: Did the pt receive and understand the discharge instructions provided?  Patient ELOPED Medications obtained and verified?  Patient ELOPED Other? No  Any new allergies since your discharge? No ELOPED Dietary orders reviewed? No Do you have support at home? Yes   Home Care and Equipment/Supplies: Were home health services ordered? not applicable If so, what is the name of the agency? N/A  Has the agency set up a time to come to the patient's home? not applicable Were any new equipment or medical supplies ordered?  No What is the name of the medical supply agency? N/A Were you able to get the supplies/equipment? not applicable Do you have any questions related to the use of the equipment or supplies? No  Functional Questionnaire: (I = Independent and D = Dependent) ADLs: I  Bathing/Dressing- I  Meal Prep- I  Eating- I  Maintaining continence- I  Transferring/Ambulation- I  Managing Meds- I  Follow up appointments reviewed:  PCP Hospital f/u appt confirmed? No  patient [facility] f/u appt confirmed? No  patient ELOPED Are transportation arrangements needed? No  If their condition worsens, is the pt aware to call PCP or go to the Emergency Dept.? Yes Was the patient provided with contact information for the PCP's office or ED? Yes Was to pt encouraged to call back with questions or concerns? Yes

## 2020-12-31 ENCOUNTER — Telehealth: Payer: Self-pay | Admitting: Family Medicine

## 2020-12-31 NOTE — Telephone Encounter (Signed)
Copied from CRM [id number]. Topic: General - Other >> Dec 29, 2020 11:35 AM Marylen Ponto wrote: Reason for CRM: Pt requests call back to go over x-ray results. Cb# [phone]
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
## 2021-01-02 NOTE — Telephone Encounter (Signed)
X-rays are normal except for arthritis in her cervical spine.

## 2021-01-13 ENCOUNTER — Telehealth: Payer: Self-pay

## 2021-01-13 ENCOUNTER — Encounter: Payer: Self-pay | Admitting: Gastroenterology

## 2021-01-13 ENCOUNTER — Ambulatory Visit (INDEPENDENT_AMBULATORY_CARE_PROVIDER_SITE_OTHER): Payer: Medicaid Other | Admitting: Gastroenterology

## 2021-01-13 VITALS — BP 136/88 | HR 70 | Ht 63.0 in | Wt 166.5 lb

## 2021-01-13 DIAGNOSIS — I2510 Atherosclerotic heart disease of native coronary artery without angina pectoris: Secondary | ICD-10-CM

## 2021-01-13 DIAGNOSIS — Z1211 Encounter for screening for malignant neoplasm of colon: Secondary | ICD-10-CM

## 2021-01-13 DIAGNOSIS — Z7902 Long term (current) use of antithrombotics/antiplatelets: Secondary | ICD-10-CM

## 2021-01-13 DIAGNOSIS — Z1212 Encounter for screening for malignant neoplasm of rectum: Secondary | ICD-10-CM

## 2021-01-13 DIAGNOSIS — Z9861 Coronary angioplasty status: Secondary | ICD-10-CM

## 2021-01-13 NOTE — Telephone Encounter (Signed)
I have reviewed schedules for [facility] and [facility] location to see if I can find a sooner appt, though all schedules are full. I then called GI office and confirmed this is screening colonoscopy and not of urgent nature. Per [facility] surgery scheduler she will post pone procedure until pt has been seen by her cardiologist 02/10/21. I will forward clearance notes to provider for 02/10/21 appt. Will send notes as FYI to surgeon's office Dr. Tomasa Rand pt has appt 02/10/21 and procedure will need to be post poned until seen by cardiology.
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
## 2021-01-13 NOTE — Telephone Encounter (Signed)
   Name: Sarah Charles  DOB: 04/24/1961  MRN: [id number]  Primary Cardiologist: Tobias Alexander, MD (Inactive)  Chart reviewed as part of pre-operative protocol coverage. Because of [name] past medical history and time since last visit, she will require a follow-up visit in order to better assess preoperative cardiovascular risk.  Last OV was >1 yr ago (06/2019).  Pre-op covering staff: - Please schedule appointment and call patient to inform them. If patient already had an upcoming appointment within acceptable timeframe, please add "pre-op clearance" to the appointment notes so provider is aware. Patient has appt with Leda Gauze in September, can find out from GI team if colonoscopy is urgent or can keep this appt for clearance. - Please contact requesting surgeon's office via preferred method (i.e, phone, fax) to inform them of need for appointment prior to surgery.  Will hold off routing question of Plavix to MD (Dr. Delton See is out, Dr. Shari Prows on leave) until patient seen in office as she may not even need to be on this longer term since PCI was several years ago - has been filled by primary care so will need to ensure not a secondary reason for taking.  [name], PA-C  01/13/2021, 2:40 PM

## 2021-01-13 NOTE — Patient Instructions (Signed)
If you are age 60 or older, your body mass index should be between 23-30. Your Body mass index is 29.49 kg/m. If this is out of the aforementioned range listed, please consider follow up with your Primary Care Provider.  If you are age 39 or younger, your body mass index should be between 19-25. Your Body mass index is 29.49 kg/m. If this is out of the aformentioned range listed, please consider follow up with your Primary Care Provider.   You have been scheduled for a colonoscopy. Please follow written instructions given to you at your visit today.  Please pick up your prep supplies at the pharmacy within the next 1-3 days. If you use inhalers (even only as needed), please bring them with you on the day of your procedure.  Due to recent changes in healthcare laws, you may see the results of your imaging and laboratory studies on MyChart before your provider has had a chance to review them.  We understand that in some cases there may be results that are confusing or concerning to you. Not all laboratory results come back in the same time frame and the provider may be waiting for multiple results in order to interpret others.  Please give [place] 48 hours in order for your provider to thoroughly review all the results before contacting the office for clarification of your results.   The [facility] providers would like to encourage you to use [facility] to communicate with providers for non-urgent requests or questions.  Due to long hold times on the telephone, sending your provider a message by [facility] may be a faster and more efficient way to get a response.  Please allow 48 business hours for a response.  Please remember that this is for non-urgent requests.   It was a pleasure to see you today!  Thank you for trusting me with your gastrointestinal care!    Scott E. Tomasa Rand, MD

## 2021-01-13 NOTE — Telephone Encounter (Signed)
[facility] Pre-operative Risk Assessment     Request for surgical clearance:     Endoscopy Procedure  What type of surgery is being performed?     Colonoscopy  When is this surgery scheduled?     01/29/21  What type of clearance is required ?   Pharmacy  Are there any medications that need to be held prior to surgery and how long? Plavix 5 days .  Practice name and name of physician performing surgery?      [facility]  What is your office phone and fax number?      Phone- [phone]  [phone]  Anesthesia type (None, local, MAC, general) ?       MAC

## 2021-01-20 ENCOUNTER — Telehealth: Payer: Self-pay

## 2021-01-20 NOTE — Telephone Encounter (Signed)
Spoke with patient on 01/13/21 after speaking with Cardiology noted in telephone note where clearance letter sent. Patient was rescheduled for 02/17/21 after appointment with Cardiology on 02/10/21.

## 2021-01-22 ENCOUNTER — Other Ambulatory Visit: Payer: Self-pay | Admitting: Obstetrics and Gynecology

## 2021-01-22 NOTE — Patient Instructions (Signed)
Visit Information  [name]  - as a part of your Medicaid benefit, you are eligible for care management and care coordination services at no cost or copay. I was unable to reach you by phone today but would be happy to help you with your health related needs. Please feel free to call at [phone].  A member of the Managed Medicaid care management team will reach out to you again over the next 7-14  days.   Kathi Der RN, BSN [place]  Triad Engineer, production - Managed Medicaid High Risk [phone].

## 2021-01-22 NOTE — Patient Outreach (Signed)
Care Coordination  01/22/2021  Terah Robey 10/04/1960 [id number]   Medicaid Managed Care   Unsuccessful Outreach Note  01/22/2021 Name: Suzannah Bettes MRN: [id number] DOB: [birth year]  Referred by: [name], MD Reason for referral : High Risk Managed Medicaid (Unsuccessful telephone outreach)   An unsuccessful telephone outreach was attempted today. The patient was referred to the case management team for assistance with care management and care coordination.   Follow Up Plan: The care management team will reach out to the patient again over the next 7-14 days.   Kathi Der RN, BSN [place]  Triad Engineer, production - Managed Medicaid High Risk [phone].

## 2021-01-23 ENCOUNTER — Other Ambulatory Visit: Payer: Self-pay | Admitting: Obstetrics and Gynecology

## 2021-01-23 ENCOUNTER — Other Ambulatory Visit: Payer: Self-pay

## 2021-01-23 NOTE — Patient Outreach (Signed)
Care Coordination  01/23/2021  Emilene Roma 1961-04-01 [id number]  RNCM returned patient's phone call-discussed upcoming appointment 02/10/21.  Kathi Der RN, BSN [place]  Triad Engineer, production - Managed Medicaid High Risk [phone].

## 2021-01-28 NOTE — Progress Notes (Deleted)
Cardiology Office Note    Date:  01/28/2021   ID:  Sarah Charles, DOB Aug 08, 1960, MRN [id number]   PCP:  [name], MD   [facility]  Cardiologist:  Tobias Alexander, MD (Inactive) *** Advanced Practice Provider:  No care team member to display Electrophysiologist:  None   [phone]   No chief complaint on file.   History of Present Illness:  Sarah Charles is a 60 y.o. female  with history of CAD cardiac CTA showed significant mLAD stenosis by FFR. DES LAD 11/04/17.Also HLD, DM2, tobacco abuse, strong family history with father dying 55 MI.  Patient last saw Dr. Delton See 06/29/19 complaining of claudication symptoms-ABI's normal  Patient on my schedule for preop clearance for colonoscopy.   Past Medical History:  Diagnosis Date   Arthritis    Coronary artery calcification seen on CT scan    a. 07/2017 noted on CT abd.   Depression    GERD (gastroesophageal reflux disease)    Hyperlipidemia    Hypertension    Obesity    Sleep apnea    a. Does not use CPAP "cause i dont think I need it anymore".   Tobacco abuse    Type II diabetes mellitus (HCC)     Past Surgical History:  Procedure Laterality Date   CHOLECYSTECTOMY     CORONARY STENT INTERVENTION N/A 11/04/2017   Procedure: CORONARY STENT INTERVENTION;  Surgeon: Swaziland, Peter M, MD;  Location: [facility] INVASIVE CV LAB;  Service: Cardiovascular;  Laterality: N/A;   INCISION AND DRAINAGE ABSCESS Left 12/22/2012   Procedure: INCISION AND DRAINAGE ABSCESS;  Surgeon: Dalia Heading, MD;  Location: [facility] ORS;  Service: General;  Laterality: Left;   INTRAVASCULAR PRESSURE WIRE/FFR STUDY N/A 11/04/2017   Procedure: INTRAVASCULAR PRESSURE WIRE/FFR STUDY;  Surgeon: Swaziland, Peter M, MD;  Location: [facility] INVASIVE CV LAB;  Service: Cardiovascular;  Laterality: N/A;   LEFT HEART CATH AND CORONARY ANGIOGRAPHY N/A 11/04/2017   Procedure: LEFT HEART CATH AND CORONARY ANGIOGRAPHY;  Surgeon: Swaziland, Peter M, MD;  Location: [facility]  INVASIVE CV LAB;  Service: Cardiovascular;  Laterality: N/A;   TONSILLECTOMY     TUBAL LIGATION      Current Medications: No outpatient medications have been marked as taking for the 02/10/21 encounter (Appointment) with Dyann Kief, PA-C.     Allergies:   Cymbalta [duloxetine hcl]   Social History   Socioeconomic History   Marital status: Widowed    Spouse name: Not on file   Number of children: Not on file   Years of education: Not on file   Highest education level: Not on file  Occupational History   Not on file  Tobacco Use   Smoking status: Every Day    Packs/day: 1.00    Years: 41.00    Pack years: 41.00    Types: Cigarettes    Last attempt to quit: 10/07/2018    Years since quitting: 2.3   Smokeless tobacco: Never  Vaping Use   Vaping Use: Never used  Substance and Sexual Activity   Alcohol use: No   Drug use: No   Sexual activity: Never    Birth control/protection: None  Other Topics Concern   Not on file  Social History Narrative   Lives in [place] with husband.  Unemployed.  Does not routinely exercise.   Social Determinants of Health   Financial Resource Strain: Low Risk    Difficulty of Paying Living Expenses: Not very hard  Food Insecurity: No  Food Insecurity   Worried About Programme researcher, broadcasting/film/video in the Last Year: Never true   Ran Out of Food in the Last Year: Never true  Transportation Needs: No Transportation Needs   Lack of Transportation (Medical): No   Lack of Transportation (Non-Medical): No  Physical Activity: Unknown   Days of Exercise per Week: Not on file   Minutes of Exercise per Session: 0 min  Stress: No Stress Concern Present   Feeling of Stress : Only a little  Social Connections: Socially Isolated   Frequency of Communication with Friends and Family: More than three times a week   Frequency of Social Gatherings with Friends and Family: Once a week   Attends Religious Services: Never   Database administrator or  Organizations: No   Attends Banker Meetings: Never   Marital Status: Widowed     Family History:  The patient's  family history includes Breast cancer in her mother; CAD in her father; COPD in her mother; CVA in her mother; Cancer in her maternal grandfather and maternal uncle; Diabetes in her maternal grandmother and sister; Heart attack in her father and mother; Heart disease in her mother; Hypercholesterolemia in her father; Hypertension in her mother; Kidney Stones in her mother.   ROS:   Please see the history of present illness.    ROS All other systems reviewed and are negative.   PHYSICAL EXAM:   VS:  There were no vitals taken for this visit.  Physical Exam  GEN: Well nourished, well developed, in no acute distress  HEENT: normal  Neck: no JVD, carotid bruits, or masses Cardiac:RRR; no murmurs, rubs, or gallops  Respiratory:  clear to auscultation bilaterally, normal work of breathing GI: soft, nontender, nondistended, + BS Ext: without cyanosis, clubbing, or edema, Good distal pulses bilaterally MS: no deformity or atrophy  Skin: warm and dry, no rash Neuro:  Alert and Oriented x 3, Strength and sensation are intact Psych: euthymic mood, full affect  Wt Readings from Last 3 Encounters:  01/13/21 166 lb 8 oz (75.5 kg)  12/26/20 157 lb (71.2 kg)  12/24/20 157 lb 6.4 oz (71.4 kg)      Studies/Labs Reviewed:   EKG:  EKG is*** ordered today.  The ekg ordered today demonstrates ***  Recent Labs: 11/13/2020: ALT 6; BUN 24; Creatinine, Ser 0.69; Hemoglobin 15.9; Platelets 305; Potassium 4.5; Sodium 132   Lipid Panel    Component Value Date/Time   CHOL 208 (H) 08/14/2020 1124   TRIG 187 (H) 08/14/2020 1124   HDL 53 08/14/2020 1124   CHOLHDL 3.9 08/14/2020 1124   CHOLHDL 9.7 (H) 07/23/2016 1638   VLDL NOT CALC 07/23/2016 1638   LDLCALC 122 (H) 08/14/2020 1124    Additional studies/ records that were reviewed today include:    LHC 11/04/17 Prox LAD  lesion is 70% stenosed. Prox Cx to Mid Cx lesion is 30% stenosed. Prox RCA to Mid RCA lesion is 20% stenosed. Post intervention, there is a 0% residual stenosis. A drug-eluting stent was successfully placed using a STENT SIERRA 2.50 X 15 MM. LV end diastolic pressure is normal.   1. Single vessel obstructive CAD.    - 70% mid LAD immediately after the first diagonal. FFR 0.78 2. Normal LVEDP 3. Successful PCI of the mid LAD with DES   Plan: DAPT for at least 6 months. She is a candidate for same day discharge. Will hold metformin for 48 hours. Will need to switch Prilosec  to Protonix.    2D Echo 10/2017   Study Conclusions   - Left ventricle: The cavity size was normal. Systolic function was   normal. The estimated ejection fraction was in the range of 55%   to 60%.  Risk Assessment/Calculations:   {Does this patient have ATRIAL FIBRILLATION?:[phone]}     ASSESSMENT:    1. Preoperative clearance   2. Coronary artery disease involving native coronary artery of native heart without angina pectoris   3. Essential hypertension   4. Hyperlipidemia, unspecified hyperlipidemia type   5. Type 2 diabetes mellitus with hyperlipidemia (HCC)      PLAN:  In order of problems listed above:  Preop clearance for colonoscopy  CAD S/P DES LAD 10/2017  HTN  HLD  DM   Shared Decision Making/Informed Consent   {Are you ordering a CV Procedure (e.g. stress test, cath, DCCV, TEE, etc)?   Press F2        :[id number]}    Medication Adjustments/Labs and Tests Ordered: Current medicines are reviewed at length with the patient today.  Concerns regarding medicines are outlined above.  Medication changes, Labs and Tests ordered today are listed in the Patient Instructions below. There are no Patient Instructions on file for this visit.   Elson Clan, PA-C  01/28/2021 7:59 AM    [facility] [address] [place], [place], [place]  [address] Phone: [phone]; Fax: [phone]

## 2021-01-29 ENCOUNTER — Encounter: Payer: Medicaid Other | Admitting: Gastroenterology

## 2021-02-06 ENCOUNTER — Other Ambulatory Visit: Payer: Self-pay | Admitting: Physician Assistant

## 2021-02-06 DIAGNOSIS — E1165 Type 2 diabetes mellitus with hyperglycemia: Secondary | ICD-10-CM

## 2021-02-06 DIAGNOSIS — Z794 Long term (current) use of insulin: Secondary | ICD-10-CM

## 2021-02-06 DIAGNOSIS — IMO0002 Reserved for concepts with insufficient information to code with codable children: Secondary | ICD-10-CM

## 2021-02-08 ENCOUNTER — Other Ambulatory Visit: Payer: Self-pay | Admitting: Physician Assistant

## 2021-02-09 NOTE — Telephone Encounter (Signed)
Requested medication (s) are due for refill today:   Yes  Requested medication (s) are on the active medication list:   Yes  Future visit scheduled:   Yes   Last ordered: 08/14/2020 #30, 3  Returned because there is not a protocol assigned to this medication.   Requested Prescriptions  Pending Prescriptions Disp Refills   vitamin B-12 (CYANOCOBALAMIN) 500 MCG tablet [Pharmacy Med Name: cyanocobalamin (vit B-12) 500 mcg tablet] 30 tablet 3    Sig: TAKE ONE TABLET BY MOUTH DAILY     Off-Protocol Failed - 02/08/2021  8:04 AM      Failed - Medication not assigned to a protocol, review manually.      Passed - Valid encounter within last 12 months    Recent Outpatient Visits           1 month ago Annual physical exam   [facility] [place], [place], [place]   2 months ago Uncontrolled type 2 diabetes mellitus with complication, with long-term current use of insulin [facility])   [place] [place], [place], [place]   3 months ago Other fatigue   [facility] [place], [place], [place]   5 months ago Low back pain without sciatica, unspecified back pain laterality, unspecified chronicity   [facility] [place], [place], [place]   5 months ago Uncontrolled type 2 diabetes mellitus with complication, with long-term current use of insulin [facility]   [place] [facility] [place], [place]zella [place], [place]       Future Appointments             Tomorrow Geni Bers, Tarri Abernethy, PA-C [facility] [address] Office, [facility]   In 1 month [name], MD [facility]   In 2 months Shari Prows, Kathlynn Grate, MD [facility] [address] Office, [facility]

## 2021-02-10 ENCOUNTER — Other Ambulatory Visit: Payer: Self-pay | Admitting: Family Medicine

## 2021-02-10 ENCOUNTER — Ambulatory Visit: Payer: Medicaid Other | Admitting: Physician Assistant

## 2021-02-10 ENCOUNTER — Other Ambulatory Visit: Payer: Self-pay | Admitting: Obstetrics and Gynecology

## 2021-02-10 DIAGNOSIS — Z01818 Encounter for other preprocedural examination: Secondary | ICD-10-CM

## 2021-02-10 DIAGNOSIS — E1169 Type 2 diabetes mellitus with other specified complication: Secondary | ICD-10-CM

## 2021-02-10 DIAGNOSIS — I10 Essential (primary) hypertension: Secondary | ICD-10-CM

## 2021-02-10 DIAGNOSIS — I2510 Atherosclerotic heart disease of native coronary artery without angina pectoris: Secondary | ICD-10-CM

## 2021-02-10 DIAGNOSIS — E785 Hyperlipidemia, unspecified: Secondary | ICD-10-CM

## 2021-02-10 DIAGNOSIS — F32A Depression, unspecified: Secondary | ICD-10-CM

## 2021-02-10 NOTE — Patient Outreach (Signed)
Care Coordination  02/10/2021  Sundai Probert 1961/04/04 [id number]   Medicaid Managed Care   Unsuccessful Outreach Note  02/10/2021 Name: Awa Bachicha MRN: [id number] DOB: 1961/04/07  Referred by: [name], MD Reason for referral : High Risk Managed Medicaid (Unsuccessful telephone outreach and visit)   An unsuccessful telephone outreach was attempted today.  RNCM also tried to attend patient's appointment scheduled for 1015 with Jacolyn Reedy, PA, at her request to meet me.  Patient was a no show for the appointment. The patient was referred to the case management team for assistance with care management and care coordination.   Follow Up Plan: The care management team will reach out to the patient again over the next 7-14 days.   Kathi Der RN, BSN [place]  Triad Engineer, production - Managed Medicaid High Risk [phone].

## 2021-02-10 NOTE — Patient Instructions (Signed)
Visit Information  [name]  - as a part of your Medicaid benefit, you are eligible for care management and care coordination services at no cost or copay. I was unable to reach you by phone today but would be happy to help you with your health related needs. Please feel free to call me at [phone].  A member of the Managed Medicaid care management team will reach out to you again over the next 7-14 days.   Kathi Der RN, BSN [place]  Triad Engineer, production - Managed Medicaid High Risk [phone].

## 2021-02-11 ENCOUNTER — Telehealth: Payer: Self-pay

## 2021-02-11 NOTE — Telephone Encounter (Signed)
Called patient's mother and she stated she would let patient know to give me a call back.

## 2021-02-11 NOTE — Telephone Encounter (Signed)
Called patient twice but there was no answer or VM. Patient did not got to her Cardiology appointment on 02/10/21 for this reason she did not receive clearance to hold her blood thinner.

## 2021-02-12 ENCOUNTER — Telehealth: Payer: Self-pay

## 2021-02-12 NOTE — Telephone Encounter (Signed)
Spoke with patient letting her know that if she did not get clearance from Cardiology her procedure would have to be rescheduled. Patient was not happy about this and was upset that her procedure could possibly be scheduled because she did not go to her cardiology appointment on 02/10/21. I encouraged patient to call cardiology and get her appointment rescheduled.

## 2021-02-13 ENCOUNTER — Telehealth: Payer: Self-pay

## 2021-02-13 NOTE — Telephone Encounter (Signed)
Called patient to reschedule procedure until after her visit with Cardiology but there was answer or VM just a busy signal.

## 2021-02-13 NOTE — Telephone Encounter (Signed)
Called patient to reschedule procedure due to patient not having Clearance to hold plavix 5 days before her procedure. Patient did not go to her appointment scheduled with cardiology on 02/10/21. Tried calling patient multiple times but there is no answer and no VM set up. I also called the number provided for patients mother and did not get an answer and was not able to leave a VM. Patient procedure for 02/17/21 has been canceled.

## 2021-02-16 NOTE — Telephone Encounter (Signed)
Attempted to reach patient again regarding her procedure on 02/17/21 being cancelled due to her missed appointment with Cardiology and not having Clearance to hold Plavix 5 days.

## 2021-02-17 ENCOUNTER — Encounter: Payer: Medicaid Other | Admitting: Gastroenterology

## 2021-02-27 ENCOUNTER — Ambulatory Visit: Payer: Medicaid Other | Admitting: Podiatry

## 2021-03-06 ENCOUNTER — Other Ambulatory Visit: Payer: Self-pay | Admitting: Obstetrics and Gynecology

## 2021-03-06 NOTE — Patient Instructions (Signed)
Visit Information  Ms. Kynzlie Blitzer  - as a part of your Medicaid benefit, you are eligible for care management and care coordination services at no cost or copay. I was unable to reach you by phone today but would be happy to help you with your health related needs. Please feel free to call me at [phone].  A member of the Managed Medicaid care management team will reach out to you again over the next 7 days.   Gaelan Glennon RN, BSN [place]  [facility] Care Management Coordinator - Managed Medicaid High Risk [phone].  

## 2021-03-06 NOTE — Patient Outreach (Signed)
Care Coordination  03/06/2021  Sarah Charles November 19, 1960 [id number]   Medicaid Managed Care   Unsuccessful Outreach Note  03/06/2021 Name: Sarah Charles MRN: [id number] DOB: [birth year]  Referred by: [name], MD Reason for referral : High Risk Managed Medicaid (Unsuccessful telephone outreach)   A second unsuccessful telephone outreach was attempted today. The patient was referred to the case management team for assistance with care management and care coordination.   Follow Up Plan: The care management team will reach out to the patient again over the next 7 days.   Kathi Der RN, BSN [place]  Triad Engineer, production - Managed Medicaid High Risk [phone].

## 2021-03-09 ENCOUNTER — Telehealth: Payer: Self-pay | Admitting: Family Medicine

## 2021-03-09 NOTE — Telephone Encounter (Signed)
..   Medicaid Managed Care   Unsuccessful Outreach Note  03/09/2021 Name: Sarah Charles MRN: [id number] DOB: 1961/04/21  Referred by: [name], MD Reason for referral : High Risk Managed Medicaid (I called the patient today to get her phone visit with the [facility] RNCM rescheduled but she did not answer and there was not a VM.)   An unsuccessful telephone outreach was attempted today. The patient was referred to the case management team for assistance with care management and care coordination.   Follow Up Plan: The care management team will reach out to the patient again over the next 7 days.   Weston Settle Care Guide, High Risk Medicaid Managed Care Embedded Care Coordination [facility]  [facility]

## 2021-03-10 ENCOUNTER — Other Ambulatory Visit: Payer: Self-pay | Admitting: Family Medicine

## 2021-03-10 DIAGNOSIS — E1142 Type 2 diabetes mellitus with diabetic polyneuropathy: Secondary | ICD-10-CM

## 2021-03-16 ENCOUNTER — Ambulatory Visit: Payer: Medicaid Other | Admitting: Family Medicine

## 2021-03-18 ENCOUNTER — Encounter (HOSPITAL_BASED_OUTPATIENT_CLINIC_OR_DEPARTMENT_OTHER): Payer: Self-pay | Admitting: Family

## 2021-03-18 ENCOUNTER — Other Ambulatory Visit: Payer: Self-pay

## 2021-03-18 ENCOUNTER — Other Ambulatory Visit (HOSPITAL_BASED_OUTPATIENT_CLINIC_OR_DEPARTMENT_OTHER): Payer: Self-pay

## 2021-03-18 ENCOUNTER — Ambulatory Visit: Payer: No Typology Code available for payment source | Attending: Internal Medicine

## 2021-03-18 ENCOUNTER — Ambulatory Visit (INDEPENDENT_AMBULATORY_CARE_PROVIDER_SITE_OTHER): Payer: Medicaid Other | Admitting: Family

## 2021-03-18 VITALS — BP 132/80 | HR 58 | Ht 63.0 in | Wt 173.1 lb

## 2021-03-18 DIAGNOSIS — E1169 Type 2 diabetes mellitus with other specified complication: Secondary | ICD-10-CM

## 2021-03-18 DIAGNOSIS — Z01810 Encounter for preprocedural cardiovascular examination: Secondary | ICD-10-CM

## 2021-03-18 DIAGNOSIS — Z720 Tobacco use: Secondary | ICD-10-CM | POA: Diagnosis not present

## 2021-03-18 DIAGNOSIS — Z23 Encounter for immunization: Secondary | ICD-10-CM

## 2021-03-18 DIAGNOSIS — I10 Essential (primary) hypertension: Secondary | ICD-10-CM

## 2021-03-18 DIAGNOSIS — I25118 Atherosclerotic heart disease of native coronary artery with other forms of angina pectoris: Secondary | ICD-10-CM

## 2021-03-18 DIAGNOSIS — E785 Hyperlipidemia, unspecified: Secondary | ICD-10-CM

## 2021-03-18 MED ORDER — PRALUENT 75 MG/ML ~~LOC~~ SOAJ: 75.0000 mg | SUBCUTANEOUS | 2 refills | Status: DC

## 2021-03-18 MED ORDER — PFIZER COVID-19 VAC BIVALENT 30 MCG/0.3ML IM SUSP
INTRAMUSCULAR | 0 refills | Status: DC
  Filled 2021-03-18: qty 0.3, 1d supply, fill #0

## 2021-03-18 NOTE — Patient Instructions (Signed)
Medication Instructions:  Your physician has recommended you make the following change in your medication: Stop: Plavix (Clopidogrel) Be sure to stop at least 5 days prior to Colonoscopy **Please take pill packs to the pharmacy for assistance**  Start: Praulent 1 injection every 2 weeks  *If you need a refill on your cardiac medications before your next appointment, please call your pharmacy*   Lab Work: Your physician recommends that you return for lab work in: 2 months for fasting lipid panel and CMP   If you have labs (blood work) drawn today and your tests are completely normal, you will receive your results only by: MyChart Message (if you have MyChart) OR A paper copy in the mail If you have any lab test that is abnormal or we need to change your treatment, we will call you to review the results.   Testing/Procedures: None today   Follow-Up: At [facility], you and your health needs are our priority.  As part of our continuing mission to provide you with exceptional heart care, we have created designated Provider Care Teams.  These Care Teams include your primary Cardiologist (physician) and Advanced Practice Providers (APPs -  Physician Assistants and Nurse Practitioners) who all work together to provide you with the care you need, when you need it.  We recommend signing up for the patient portal called "MyChart".  Sign up information is provided on this After Visit Summary.  MyChart is used to connect with patients for Virtual Visits (Telemedicine).  Patients are able to view lab/test results, encounter notes, upcoming appointments, etc.  Non-urgent messages can be sent to your provider as well.   To learn more about what you can do with MyChart, go to [email or web address].    Your next appointment:   3 month(s)  The format for your next appointment:   In Person  Provider:   You may see Meriam Sprague, MDor one of the following Advanced Practice Providers on  your designated Care Team:   Tereso Newcomer, PA-C Vin Hitchcock, [place] Gillian Shields FNP

## 2021-03-18 NOTE — Progress Notes (Addendum)
Office Visit    Patient Name: Sarah Charles Date of Encounter: 03/18/2021  PCP:  Charlott Rakes, [place]  Cardiologist:  Freada Bergeron, MD  Advanced Practice Provider:  No care team member to display Electrophysiologist:  None     Chief Complaint    Sarah Charles is a 60 y.o. female with a hx of CAD, DM2, HLD, obesity, tobacco use, depression, GERD presents today for preop clearance  Past Medical History    Past Medical History:  Diagnosis Date   Arthritis    Coronary artery calcification seen on CT scan    a. 07/2017 noted on CT abd.   Depression    GERD (gastroesophageal reflux disease)    Hyperlipidemia    Hypertension    Obesity    Sleep apnea    a. Does not use CPAP "cause i dont think I need it anymore".   Tobacco abuse    Type II diabetes mellitus ([place])    Past Surgical History:  Procedure Laterality Date   CHOLECYSTECTOMY     CORONARY STENT INTERVENTION N/A 11/04/2017   Procedure: CORONARY STENT INTERVENTION;  Surgeon: Martinique, Peter M, MD;  Location: [place] [facility];  Service: Cardiovascular;  Laterality: N/A;   INCISION AND DRAINAGE ABSCESS Left 12/22/2012   Procedure: INCISION AND DRAINAGE ABSCESS;  Surgeon: Jamesetta So, MD;  Location: [facility];  Service: General;  Laterality: Left;   INTRAVASCULAR PRESSURE WIRE/FFR STUDY N/A 11/04/2017   Procedure: INTRAVASCULAR PRESSURE WIRE/FFR STUDY;  Surgeon: Martinique, Peter M, MD;  Location: [place] [facility];  Service: Cardiovascular;  Laterality: N/A;   LEFT HEART CATH AND CORONARY ANGIOGRAPHY N/A 11/04/2017   Procedure: LEFT HEART CATH AND CORONARY ANGIOGRAPHY;  Surgeon: Martinique, Peter M, MD;  Location: [facility];  Service: Cardiovascular;  Laterality: N/A;   TONSILLECTOMY     TUBAL LIGATION      Allergies  Allergies  Allergen Reactions   Cymbalta [Duloxetine Hcl] Nausea Only    Nausea, lack of therapeutic effect    History of Present Illness    Sarah Charles is a  60 y.o. female with a hx of CAD, DM2, HLD, obesity, tobacco use, depression, GERD  last seen 06/29/19.  She was evaluated in the ED 10/2017 for chest pressure.  She underwent cardiac CTA showing significant mid LAD stenosis by FFR.  She was discharged and later came for outpatient cardiac catheterization 10/2017 revealing 75% stenosis in proximal LAD treated with DES.  She was last seen 06/29/2019 doing well with the exception of myalgias.  She was transition from atorvastatin to Crestor.  She presents today for preop clearance for colonoscopy. She endorses feeling overall well. Reports no shortness of breath nor dyspnea on exertion. Reports no chest pain, pressure, or tightness. No edema, orthopnea, PND. Reports no palpitations. Endorses fatigue. Tells me she just feels worn out throughout the day. She is having difficulty sleeping as she lost her husband last year. Does have good family support.   EKGs/Labs/Other Studies Reviewed:   The following studies were reviewed today:  Echo 10/2017 Study Conclusions   - Left ventricle: The cavity size was normal. Systolic function was    normal. The estimated ejection fraction was in the range of 55%    to 60%.   -------------------------------------------------------------------  Study data:  No prior study was available for comparison.  Study  status:  Routine.  Procedure:  The patient reported no pain pre or  post  test. Transthoracic echocardiography. Image quality was fair.  Study completion:  There were no complications.  Transthoracic echocardiography.  M-mode, complete 2D, spectral  Doppler, and color Doppler.  Birthdate:  Patient birthdate:  May 20, 1961.  Age:  Patient is 60 yr old.  Sex:  Gender: female.  BMI: 31.7 kg/m^2.  Blood pressure:     151/91  Patient status:  Inpatient.  Study date:  Study date: 10/30/2017. Study time: 11:39  AM.  Location:  Echo laboratory.   -------------------------------------------------------------------    -------------------------------------------------------------------  Left ventricle:  The cavity size was normal. Systolic function was  normal. The estimated ejection fraction was in the range of 55% to  60%.   -------------------------------------------------------------------  Aortic valve:   Structurally normal valve.   Cusp separation was  normal.  Doppler:  Transvalvular velocity was within the normal  range. There was no stenosis. There was no regurgitation.   -------------------------------------------------------------------  Mitral valve:   Structurally normal valve.   Leaflet separation was  normal.  Doppler:  Transvalvular velocity was within the normal  range. There was no evidence for stenosis. There was no  regurgitation.   -------------------------------------------------------------------  Left atrium:  The atrium was normal in size.   -------------------------------------------------------------------  Right ventricle:  The cavity size was normal. Wall thickness was  normal. Systolic function was normal.   -------------------------------------------------------------------  Tricuspid valve:   Structurally normal valve.   Leaflet separation  was normal.  Doppler:  Transvalvular velocity was within the normal  range. There was trivial regurgitation.   -------------------------------------------------------------------  Right atrium:  The atrium was normal in size.   -------------------------------------------------------------------  Pericardium: There was no pericardial effusion.  LHC 10/2017 Prox LAD lesion is 70% stenosed. Prox Cx to Mid Cx lesion is 30% stenosed. Prox RCA to Mid RCA lesion is 20% stenosed. Post intervention, there is a 0% residual stenosis. A drug-eluting stent was successfully placed using a STENT SIERRA 2.50 X 15 MM. LV end diastolic pressure is normal.   1. Single vessel obstructive CAD.    - 70% mid LAD immediately after the first  diagonal. FFR 0.78 2. Normal LVEDP 3. Successful PCI of the mid LAD with DES   Plan: DAPT for at least 6 months. She is a candidate for same day discharge. Will hold metformin for 48 hours. Will need to switch Prilosec to Protonix.    EKG:  EKG is  ordered today.  The ekg ordered today demonstrates NSR 58 bpm with no acute ST/T wave changes.   Recent Labs: 11/13/2020: ALT 6; BUN 24; Creatinine, Ser 0.69; Hemoglobin 15.9; Platelets 305; Potassium 4.5; Sodium 132  Recent Lipid Panel    Component Value Date/Time   CHOL 208 (H) 08/14/2020 1124   TRIG 187 (H) 08/14/2020 1124   HDL 53 08/14/2020 1124   CHOLHDL 3.9 08/14/2020 1124   CHOLHDL 9.7 (H) 07/23/2016 1638   VLDL NOT CALC 07/23/2016 1638   LDLCALC 122 (H) 08/14/2020 1124    Home Medications   Current Meds  Medication Sig   Alirocumab (PRALUENT) 75 MG/ML SOAJ Inject 75 mg into the skin every 14 (fourteen) days.   amLODipine (NORVASC) 5 MG tablet Take 1 tablet (5 mg total) by mouth daily.   aspirin EC 81 MG tablet Take 81 mg by mouth daily.   Blood Glucose Monitoring Suppl (ACCU-CHEK GUIDE ME) w/Device KIT 1 each by Does not apply route 3 (three) times daily.   buPROPion (WELLBUTRIN SR) 150 MG 12 hr tablet Take 1 tablet (150 mg total)  by mouth 2 (two) times daily.   cetirizine (ZYRTEC) 10 MG tablet TAKE 1 TABLET BY MOUTH EVERY DAY   clotrimazole-betamethasone (LOTRISONE) cream Apply 1 application topically 2 (two) times daily.   dicyclomine (BENTYL) 10 MG capsule Take 1 capsule by mouth daily.   fenofibrate (TRICOR) 145 MG tablet Take 1 tablet (145 mg total) by mouth daily.   FLUoxetine (PROZAC) 20 MG capsule TAKE 1 CAPSULE BY MOUTH EVERY DAY   gabapentin (NEURONTIN) 300 MG capsule TAKE ONE CAPSULE BY MOUTH twice daily   glucose blood (ACCU-CHEK GUIDE) test strip USE AS DIRECTED (Patient taking differently: 1 each by Other route as directed.)   insulin glargine (LANTUS SOLOSTAR) 100 UNIT/ML Solostar Pen Inject 60 Units into the  skin at bedtime. (Patient taking differently: Inject 55 Units into the skin at bedtime.)   Insulin Pen Needle (B-D UF III MINI PEN NEEDLES) 31G X 5 MM MISC USE AS DIRECTED (Patient taking differently: 1 each by Other route as directed.)   Lancets Misc. (ACCU-CHEK FASTCLIX LANCET) KIT USE AS DIRECTED (Patient taking differently: 1 each by Other route as directed.)   liraglutide (VICTOZA) 18 MG/3ML SOPN INJECT 1.67m SUBCUTANEOUSLY ONCE A DAY   lisinopril (ZESTRIL) 20 MG tablet Take 1 tablet (20 mg total) by mouth daily.   meloxicam (MOBIC) 7.5 MG tablet Take 1 tablet (7.5 mg total) by mouth daily.   methocarbamol (ROBAXIN) 500 MG tablet Take 1 tablet (500 mg total) by mouth 2 (two) times daily as needed (back pain).   metoCLOPramide (REGLAN) 10 MG tablet TAKE 1 TABLET BY MOUTH EVERY EIGHT HOURS AS NEEDED FOR NAUSEA OR VOMITING.   metoprolol tartrate (LOPRESSOR) 25 MG tablet Take 1 tablet (25 mg total) by mouth 2 (two) times daily.   metroNIDAZOLE (METROGEL VAGINAL) 0.75 % vaginal gel Place 1 Applicatorful vaginally at bedtime.   Multiple Vitamin (MULTIVITAMIN) tablet Take 1 tablet by mouth daily.   mupirocin ointment (BACTROBAN) 2 % Apply 1 application topically 2 (two) times daily. X 7 days at the first sign of skin infection   nitroGLYCERIN (NITROSTAT) 0.4 MG SL tablet Place 0.4 mg under the tongue every 5 (five) minutes as needed for chest pain.   pantoprazole (PROTONIX) 40 MG tablet Take 1 tablet (40 mg total) by mouth daily.   rosuvastatin (CRESTOR) 40 MG tablet Take 1 tablet (40 mg total) by mouth daily.   sitaGLIPtin-metformin (JANUMET) 50-1000 MG tablet Take 1 tablet by mouth 2 (two) times daily with a meal.   traZODone (DESYREL) 50 MG tablet TAKE ONE TABLET BY MOUTH EVERYDAY AT BEDTIME   vitamin B-12 (CYANOCOBALAMIN) 500 MCG tablet TAKE ONE TABLET BY MOUTH DAILY   vitamin C (ASCORBIC ACID) 500 MG tablet Take 500 mg by mouth daily.   [DISCONTINUED] clopidogrel (PLAVIX) 75 MG tablet Take 1  tablet (75 mg total) by mouth daily.     Review of Systems      All other systems reviewed and are otherwise negative except as noted above.  Physical Exam    VS:  BP 132/80   Pulse (!) 58   Ht _0  (1.6 m)   Wt 173 lb 1.6 oz (78.5 kg)   SpO2 95%   BMI 30.66 kg/m  , BMI Body mass index is 30.66 kg/m.  Wt Readings from Last 3 Encounters:  03/18/21 173 lb 1.6 oz (78.5 kg)  01/13/21 166 lb 8 oz (75.5 kg)  12/26/20 157 lb (71.2 kg)     GEN: Well nourished, well developed, in  no acute distress. HEENT: normal. Neck: Supple, no JVD, carotid bruits, or masses. Cardiac: RRR, no murmurs, rubs, or gallops. No clubbing, cyanosis, edema.  Radials/PT 2+ and equal bilaterally.  Respiratory:  Respirations regular and unlabored, clear to auscultation bilaterally. GI: Soft, nontender, nondistended. MS: No deformity or atrophy. Skin: Warm and dry, no rash. Neuro:  Strength and sensation are intact. Psych: Normal affect.  Assessment & Plan    Preoperative cardiovascular clearance - According to the Revised Cardiac Risk Index (RCRI), her Perioperative Risk of Major Cardiac Event is (%): 6.6. Her Functional Capacity in METs is: 5.69 according to the Duke Activity Status Index (DASI). EKG today with SB and no acute ST/T wave changes. She is deemed acceptable risk for planned procedure without additional cardiovascular intervention. Will route to GI so they are aware. We have discontinued Plavix today, contacted pharmacy who does her pill packs to make them aware. If needed,   CAD - Stable with no anginal symptoms. No indication for ischemic evaluation.  GDMT includes aspirin, metoprolol, rosuvastatin. Praluent added today. As she is >2 years from DES will discontinue Plavix.   Tobacco use - Smoking cessation encouraged. Recommend utilization of 1800QUITNOW.   HTN - BP reasonably well controlled. Continue current medications. If BP persistently elevated at follow up, increase dose Amlodipine.    HLD, LDL goal <70- Intolerant to Atorvastatin 72m and 820mwith myalgias. Both parents with MI, likely HLD as well. Reports compliance with Crestor 4079mD. Lipid panel 08/14/20 not at goal despite high dose Crestor with total cholesterol 208, triglycerides 187, LDL 122. Add Praluent 54m16m4 days with repeat CMP/lipid panel in 2 mos. If LDL not at goal, plan to increase to 150mg46m Likely discontinue fenofibrate pending next lipid panel.  DM2 - Continue to follow with PCP. Reports blood sugar well controlled.   Disposition: Follow up in 3 month(s) with Dr. PembeJohney FramePP.  Signed, CaitlLoel Dubonnet10/26/2022, 1:22 PM [facility]

## 2021-03-18 NOTE — Progress Notes (Signed)
   Covid-19 Vaccination Clinic  Name:  Sarah Charles    MRN: [id number] DOB: 17-Oct-1960  03/18/2021  [name] was observed post Covid-19 immunization for 15 minutes without incident. She was provided with Vaccine Information Sheet and instruction to access the V-Safe system.   [name] was instructed to call 911 with any severe reactions post vaccine: Difficulty breathing  Swelling of face and throat  A fast heartbeat  A bad rash all over body  Dizziness and weakness   Immunizations Administered     Name Date Dose VIS Date Route   Pfizer Covid-19 Vaccine Bivalent Booster 03/18/2021 10:43 AM 0.3 mL 01/21/2021 Intramuscular   Manufacturer: [employer], [employer]   Lot: [id number]   NDC: [phone]

## 2021-03-19 ENCOUNTER — Telehealth: Payer: Self-pay | Admitting: Family Medicine

## 2021-03-19 ENCOUNTER — Telehealth (HOSPITAL_BASED_OUTPATIENT_CLINIC_OR_DEPARTMENT_OTHER): Payer: Self-pay | Admitting: Family

## 2021-03-19 NOTE — Telephone Encounter (Signed)
Patient called today thinking she had been scheduled for a colonoscopy.  There was no appointment scheduled for her that I can see.  Can you please call and advise?  Thank you.

## 2021-03-19 NOTE — Telephone Encounter (Signed)
Attempted to return patients phone call. Called patients number and received a busy signal. I also called patient alternative contact and there was no answer and no VM set up. I have attempted to reach patient multiple times regarding Rescheduling her procedure and Cardiology clearance I have not been able to contact patient because she will not answer the phone.

## 2021-03-19 NOTE — Telephone Encounter (Signed)
..   Medicaid Managed Care   Unsuccessful Outreach Note  03/19/2021 Name: Sarah Charles MRN: [id number] DOB: 05-15-1961  Referred by: [name], MD Reason for referral : High Risk Managed Medicaid (I called the patient today to get her phone visit with the [facility] RNCM rescheduled. She did not answer and I could not leave a message.)   A second unsuccessful telephone outreach was attempted today. The patient was referred to the case management team for assistance with care management and care coordination.   Follow Up Plan: The care management team will reach out to the patient again over the next 7 days.   Weston Settle Care Guide, High Risk Medicaid Managed Care Embedded Care Coordination [facility]  [facility]

## 2021-03-19 NOTE — Telephone Encounter (Signed)
Received notification from cover my meds for prior authorization for Praluent approved through 03/18/2022.  PA [id number].  I have routed to the pharmacy so they are aware.  Alver Sorrow, NP

## 2021-03-24 ENCOUNTER — Telehealth: Payer: Self-pay | Admitting: Family

## 2021-03-24 ENCOUNTER — Other Ambulatory Visit: Payer: Self-pay

## 2021-03-24 DIAGNOSIS — Z1211 Encounter for screening for malignant neoplasm of colon: Secondary | ICD-10-CM

## 2021-03-24 DIAGNOSIS — Z1212 Encounter for screening for malignant neoplasm of rectum: Secondary | ICD-10-CM

## 2021-03-24 NOTE — Telephone Encounter (Signed)
Patient is requesting additional information regarding her colonoscopy (date, time, location etc.). I informed her that we are not performing the procedure and we only provided the clearance, but she states she does not know how to get in contact with the requesting office. Do we have a phone # we can give her?

## 2021-04-02 ENCOUNTER — Other Ambulatory Visit: Payer: Self-pay | Admitting: Physician Assistant

## 2021-04-02 DIAGNOSIS — Z794 Long term (current) use of insulin: Secondary | ICD-10-CM

## 2021-04-02 DIAGNOSIS — E1165 Type 2 diabetes mellitus with hyperglycemia: Secondary | ICD-10-CM

## 2021-04-02 NOTE — Telephone Encounter (Signed)
LOV 12/24/20  No future OV scheduled with PCP at this time. Approved per protocol.  Requested Prescriptions  Pending Prescriptions Disp Refills  . glucose blood (ACCU-CHEK GUIDE) test strip [Pharmacy Med Name: Accu-Chek Guide test strips] 100 strip 2    Sig: USE AS DIRECTED twice daily     Endocrinology: Diabetes - Testing Supplies Passed - 04/02/2021  8:03 AM      Passed - Valid encounter within last 12 months    Recent Outpatient Visits          3 months ago Annual physical exam   [facility] [place], [place], [place]   4 months ago Uncontrolled type 2 diabetes mellitus with complication, with long-term current use of insulin [facility])   [place] [facility] [place], [place], [place]   5 months ago Other fatigue   [place] [place], [place], [place]   7 months ago Low back pain without sciatica, unspecified back pain laterality, unspecified chronicity   [place] [address] [facility] [place], [place], [place]   7 months ago Uncontrolled type 2 diabetes mellitus with complication, with long-term current use of insulin [facility])   [place] [facility] [place], [place], [place]      Future Appointments            In 2 months Dan Humphreys, Storm Frisk, NP [facility], [facility]

## 2021-04-03 ENCOUNTER — Other Ambulatory Visit: Payer: Self-pay | Admitting: Family Medicine

## 2021-04-03 ENCOUNTER — Telehealth: Payer: Self-pay | Admitting: Family Medicine

## 2021-04-03 DIAGNOSIS — Z1231 Encounter for screening mammogram for malignant neoplasm of breast: Secondary | ICD-10-CM

## 2021-04-03 NOTE — Telephone Encounter (Signed)
Pt is calling to let Dr. Alvis Lemmings know that Alirocumab (PRALUENT) 75 MG/ML SOAJ [[id number]] is not covered under her insurance. Can the medication be changed to repatha? Pt would like to know either way if Dr. Alvis Lemmings is able to approve or deny. [phone] Preferred Pharmacy- [facility]

## 2021-04-06 NOTE — Telephone Encounter (Signed)
I reviewed her chart and it looks like the patient's Praluent was approved thru 03/18/2022. This is something prescribed by her Cardiologist and she should contact them. I attempted to call her to inform her of this but was unable to reach her or leave a VM on the number listed in her chart.

## 2021-04-07 ENCOUNTER — Other Ambulatory Visit: Payer: Self-pay | Admitting: Physician Assistant

## 2021-04-07 DIAGNOSIS — F32A Depression, unspecified: Secondary | ICD-10-CM

## 2021-04-07 DIAGNOSIS — E1165 Type 2 diabetes mellitus with hyperglycemia: Secondary | ICD-10-CM

## 2021-04-07 DIAGNOSIS — I10 Essential (primary) hypertension: Secondary | ICD-10-CM

## 2021-04-07 DIAGNOSIS — I739 Peripheral vascular disease, unspecified: Secondary | ICD-10-CM

## 2021-04-07 DIAGNOSIS — E785 Hyperlipidemia, unspecified: Secondary | ICD-10-CM

## 2021-04-07 DIAGNOSIS — E1169 Type 2 diabetes mellitus with other specified complication: Secondary | ICD-10-CM

## 2021-04-07 DIAGNOSIS — I2510 Atherosclerotic heart disease of native coronary artery without angina pectoris: Secondary | ICD-10-CM

## 2021-04-07 NOTE — Telephone Encounter (Signed)
Due 06/08/21. Last filled on 03/18/21. Requested Prescriptions  Refused Prescriptions Disp Refills  . rosuvastatin (CRESTOR) 40 MG tablet [Pharmacy Med Name: rosuvastatin 40 mg tablet] 90 tablet 1    Sig: TAKE ONE TABLET BY MOUTH EVERY EVENING     Cardiovascular:  Antilipid - Statins Failed - 04/07/2021  8:02 AM      Failed - Total Cholesterol in normal range and within 360 days    Cholesterol, Total  Date Value Ref Range Status  08/14/2020 208 (H) 100 - 199 mg/dL Final         Failed - LDL in normal range and within 360 days    LDL Chol Calc (NIH)  Date Value Ref Range Status  08/14/2020 122 (H) 0 - 99 mg/dL Final         Failed - Triglycerides in normal range and within 360 days    Triglycerides  Date Value Ref Range Status  08/14/2020 187 (H) 0 - 149 mg/dL Final         Passed - HDL in normal range and within 360 days    HDL  Date Value Ref Range Status  08/14/2020 53 >39 mg/dL Final         Passed - Patient is not pregnant      Passed - Valid encounter within last 12 months    Recent Outpatient Visits          3 months ago Annual physical exam   [place] [facility] [place], [place], [place]   4 months ago Uncontrolled type 2 diabetes mellitus with complication, with long-term current use of insulin [facility])   [place] [address] [facility] [place], [place], [place]   5 months ago Other fatigue   [place] [employer] [facility] [place], [place], [place]   7 months ago Low back pain without sciatica, unspecified back pain laterality, unspecified chronicity   [place] [address] [facility] [place], [place], [place]   7 months ago Uncontrolled type 2 diabetes mellitus with complication, with long-term current use of insulin [facility])   [place] [facility] [place], [place], [place]      Future Appointments            In 2 months Dan Humphreys, Storm Frisk, NP [facility], [facility]           .  lisinopril (ZESTRIL) 20 MG tablet [Pharmacy Med Name: lisinopril 20 mg tablet] 90 tablet 1    Sig: TAKE ONE TABLET BY MOUTH DAILY     Cardiovascular:  ACE Inhibitors Passed - 04/07/2021  8:02 AM      Passed - Cr in normal range and within 180 days    Creat  Date Value Ref Range Status  07/23/2016 0.50 0.50 - 1.05 mg/dL Final    Comment:      For patients > or = 60 years of age: The upper reference limit for Creatinine is approximately 13% higher for people identified as African-American.      Creatinine, Ser  Date Value Ref Range Status  11/13/2020 0.69 0.44 - 1.00 mg/dL Final   Creatinine, POC  Date Value Ref Range Status  09/16/2016 100 mg/dL Final   Creatinine, Urine  Date Value Ref Range Status  07/23/2016 47 20 - 320 mg/dL Final         Passed - K in normal range and within 180 days    Potassium  Date Value Ref Range Status  11/13/2020  4.5 3.5 - 5.1 mmol/L Final    Comment:    SLIGHT HEMOLYSIS         Passed - Patient is not pregnant      Passed - Last BP in normal range    BP Readings from Last 1 Encounters:  03/18/21 132/80         Passed - Valid encounter within last 6 months    Recent Outpatient Visits          3 months ago Annual physical exam   [place] [facility] [place], [place], [place]   4 months ago Uncontrolled type 2 diabetes mellitus with complication, with long-term current use of insulin [facility])   [place] [facility] [place], [place], [place]   5 months ago Other fatigue   [place] [facility] [place], [place], [place]   7 months ago Low back pain without sciatica, unspecified back pain laterality, unspecified chronicity   [place] [address] [facility] [place], [place], [place]   7 months ago Uncontrolled type 2 diabetes mellitus with complication, with long-term current use of insulin [facility])   [place] [facility] [place], [place], [place]      Future  Appointments            In 2 months Dan Humphreys, Storm Frisk, NP [facility]           . FLUoxetine (PROZAC) 20 MG capsule [Pharmacy Med Name: fluoxetine 20 mg capsule] 90 capsule 1    Sig: TAKE ONE CAPSULE BY MOUTH EVERY MORNING     Psychiatry:  Antidepressants - SSRI Passed - 04/07/2021  8:02 AM      Passed - Completed PHQ-2 or PHQ-9 in the last 360 days      Passed - Valid encounter within last 6 months    Recent Outpatient Visits          3 months ago Annual physical exam   [place] [facility] [place], [place], [place]   4 months ago Uncontrolled type 2 diabetes mellitus with complication, with long-term current use of insulin [facility])   [place] [facility] [place], [place], [place]   5 months ago Other fatigue   [place] [facility] [place], [place], [place]   7 months ago Low back pain without sciatica, unspecified back pain laterality, unspecified chronicity   [place] [address] [facility] [place], [place], [place]   7 months ago Uncontrolled type 2 diabetes mellitus with complication, with long-term current use of insulin [facility])    [facility] [place], [place], [place]      Future Appointments            In 2 months Dan Humphreys, Storm Frisk, NP [facility]

## 2021-04-10 ENCOUNTER — Encounter: Payer: Medicaid Other | Admitting: Gastroenterology

## 2021-04-14 ENCOUNTER — Ambulatory Visit: Payer: Medicaid Other | Admitting: Cardiology

## 2021-04-15 ENCOUNTER — Other Ambulatory Visit: Payer: Self-pay | Admitting: Family Medicine

## 2021-04-15 DIAGNOSIS — I739 Peripheral vascular disease, unspecified: Secondary | ICD-10-CM

## 2021-04-15 DIAGNOSIS — E785 Hyperlipidemia, unspecified: Secondary | ICD-10-CM

## 2021-04-15 DIAGNOSIS — E1165 Type 2 diabetes mellitus with hyperglycemia: Secondary | ICD-10-CM

## 2021-04-15 DIAGNOSIS — E1169 Type 2 diabetes mellitus with other specified complication: Secondary | ICD-10-CM

## 2021-04-15 DIAGNOSIS — I2510 Atherosclerotic heart disease of native coronary artery without angina pectoris: Secondary | ICD-10-CM

## 2021-04-15 MED ORDER — ROSUVASTATIN CALCIUM 40 MG PO TABS: 40.0000 mg | ORAL_TABLET | Freq: Every day | ORAL | 1 refills | Status: DC

## 2021-04-21 ENCOUNTER — Other Ambulatory Visit: Payer: Self-pay | Admitting: Physician Assistant

## 2021-04-21 DIAGNOSIS — F32A Depression, unspecified: Secondary | ICD-10-CM

## 2021-04-21 DIAGNOSIS — I10 Essential (primary) hypertension: Secondary | ICD-10-CM

## 2021-04-24 ENCOUNTER — Other Ambulatory Visit: Payer: Self-pay | Admitting: Obstetrics and Gynecology

## 2021-04-24 NOTE — Patient Outreach (Signed)
Care Coordination  04/24/2021  Sarah Charles 1960-11-07 [id number]   Medicaid Managed Care   Unsuccessful Outreach Note  04/24/2021 Name: Sarah Charles MRN: [id number] DOB: 1960/06/13  Referred by: [name], MD Reason for referral : High Risk Managed Medicaid (Unsuccessful telephone outreach)   Third unsuccessful telephone outreach was attempted today. The patient was referred to the case management team for assistance with care management and care coordination. The patient's primary care provider has been notified of our unsuccessful attempts to make or maintain contact with the patient. The care management team is pleased to engage with this patient at any time in the future should he/she be interested in assistance from the care management team.   Follow Up Plan: We have been unable to make contact with the patient for follow up. The care management team is available to follow up with the patient after provider conversation with the patient regarding recommendation for care management engagement and subsequent re-referral to the care management team.   Kathi Der RN, BSN [place]  [facility] Care Management Coordinator - Managed [facility] High Risk [phone].

## 2021-04-24 NOTE — Patient Instructions (Signed)
Visit Information  Ms. [name]  - as a part of your Medicaid benefit, you are eligible for care management and care coordination services at no cost or copay. I was unable to reach you by phone today but would be happy to help you with your health related needs. Please feel free to call me at [phone].  Kathi Der RN, BSN [place]  [employer] - Managed Medicaid High Risk [phone].

## 2021-05-05 ENCOUNTER — Other Ambulatory Visit: Payer: Self-pay

## 2021-05-05 ENCOUNTER — Ambulatory Visit: Payer: No Typology Code available for payment source | Attending: Family Medicine | Admitting: Family Medicine

## 2021-05-05 ENCOUNTER — Encounter: Payer: Self-pay | Admitting: Family Medicine

## 2021-05-05 VITALS — BP 134/75 | HR 61 | Ht 63.0 in | Wt 179.4 lb

## 2021-05-05 DIAGNOSIS — E785 Hyperlipidemia, unspecified: Secondary | ICD-10-CM

## 2021-05-05 DIAGNOSIS — I2510 Atherosclerotic heart disease of native coronary artery without angina pectoris: Secondary | ICD-10-CM

## 2021-05-05 DIAGNOSIS — Z1211 Encounter for screening for malignant neoplasm of colon: Secondary | ICD-10-CM

## 2021-05-05 DIAGNOSIS — E1169 Type 2 diabetes mellitus with other specified complication: Secondary | ICD-10-CM

## 2021-05-05 DIAGNOSIS — E1159 Type 2 diabetes mellitus with other circulatory complications: Secondary | ICD-10-CM | POA: Diagnosis not present

## 2021-05-05 DIAGNOSIS — E1142 Type 2 diabetes mellitus with diabetic polyneuropathy: Secondary | ICD-10-CM

## 2021-05-05 DIAGNOSIS — M19041 Primary osteoarthritis, right hand: Secondary | ICD-10-CM

## 2021-05-05 DIAGNOSIS — F32A Depression, unspecified: Secondary | ICD-10-CM

## 2021-05-05 DIAGNOSIS — Z794 Long term (current) use of insulin: Secondary | ICD-10-CM

## 2021-05-05 DIAGNOSIS — I152 Hypertension secondary to endocrine disorders: Secondary | ICD-10-CM

## 2021-05-05 DIAGNOSIS — M19042 Primary osteoarthritis, left hand: Secondary | ICD-10-CM

## 2021-05-05 DIAGNOSIS — M545 Low back pain, unspecified: Secondary | ICD-10-CM

## 2021-05-05 LAB — POCT GLYCOSYLATED HEMOGLOBIN (HGB A1C): HbA1c, POC (controlled diabetic range): 7.2 % — AB (ref 0.0–7.0)

## 2021-05-05 LAB — GLUCOSE, POCT (MANUAL RESULT ENTRY): POC Glucose: 131 mg/dl — AB (ref 70–99)

## 2021-05-05 MED ORDER — LANTUS SOLOSTAR 100 UNIT/ML ~~LOC~~ SOPN
50.0000 [IU] | PEN_INJECTOR | Freq: Every day | SUBCUTANEOUS | 3 refills | Status: DC
Start: 2021-05-05 — End: 2021-12-11

## 2021-05-05 MED ORDER — DICLOFENAC SODIUM 1 % EX GEL
4.0000 g | Freq: Four times a day (QID) | CUTANEOUS | 1 refills | Status: DC
Start: 2021-05-05 — End: 2024-01-13

## 2021-05-05 MED ORDER — FLUOXETINE HCL 20 MG PO CAPS: ORAL_CAPSULE | ORAL | 1 refills | Status: DC

## 2021-05-05 MED ORDER — TRAZODONE HCL 50 MG PO TABS: ORAL_TABLET | ORAL | 1 refills | Status: DC

## 2021-05-05 MED ORDER — VICTOZA 18 MG/3ML ~~LOC~~ SOPN: PEN_INJECTOR | SUBCUTANEOUS | 3 refills | Status: DC

## 2021-05-05 MED ORDER — BUPROPION HCL ER (SR) 150 MG PO TB12: 150.0000 mg | ORAL_TABLET | Freq: Two times a day (BID) | ORAL | 1 refills | Status: DC

## 2021-05-05 MED ORDER — GABAPENTIN 300 MG PO CAPS: 300.0000 mg | ORAL_CAPSULE | Freq: Two times a day (BID) | ORAL | 1 refills | Status: DC

## 2021-05-05 MED ORDER — JANUMET 50-1000 MG PO TABS: 1.0000 | ORAL_TABLET | Freq: Two times a day (BID) | ORAL | 1 refills | Status: DC

## 2021-05-05 MED ORDER — METOPROLOL TARTRATE 25 MG PO TABS
25.0000 mg | ORAL_TABLET | Freq: Two times a day (BID) | ORAL | 1 refills | Status: DC
Start: 2021-05-05 — End: 2021-11-04

## 2021-05-05 MED ORDER — AMLODIPINE BESYLATE 5 MG PO TABS: 5.0000 mg | ORAL_TABLET | Freq: Every day | ORAL | 1 refills | Status: DC

## 2021-05-05 MED ORDER — METHOCARBAMOL 500 MG PO TABS
500.0000 mg | ORAL_TABLET | Freq: Three times a day (TID) | ORAL | 3 refills | Status: DC | PRN
Start: 2021-05-05 — End: 2021-07-20

## 2021-05-05 MED ORDER — LISINOPRIL 20 MG PO TABS
20.0000 mg | ORAL_TABLET | Freq: Every day | ORAL | 1 refills | Status: DC
Start: 2021-05-05 — End: 2021-11-04

## 2021-05-05 MED ORDER — FENOFIBRATE 145 MG PO TABS: 145.0000 mg | ORAL_TABLET | Freq: Every day | ORAL | 3 refills | Status: DC

## 2021-05-05 NOTE — Progress Notes (Signed)
Cramps in hands Back pain.

## 2021-05-05 NOTE — Progress Notes (Signed)
Subjective:  Patient ID: Sarah Charles, female    DOB: December 18, 1960  Age: 60 y.o. MRN: [id number]  CC: Diabetes   HPI Sarah Charles is a 60 y.o. year old female with a history of type 2 diabetes mellitus (A1c 7.2), hypertension, hyperlipidemia, GERD, CAD (s/p DES).  Interval History: She complains of chronic pain acoss her lower back 7/10 which has been present for a long time and radiates down both legs. She uses Tylenol for pain. Muscle releaxants previously provided relief but she has run out. She states she has no time for Physical therapy. Her hands have been cramping x4 months and it worse with cold weather and in the mornings  She previously requested  Colonoscopy but now states she no longer wants to do it  She is yet to commence Praluent which was prescribed by Cardiology for management of her hyperlipidemia.  Endorses compliance with her statin. Her A1c is 7.2 down from 11.3 previously.  She has no hypoglycemic episodes and her neuropathy is controlled on gabapentin.  She has no visual concerns. Past Medical History:  Diagnosis Date   Arthritis    Coronary artery calcification seen on CT scan    a. 07/2017 noted on CT abd.   Depression    GERD (gastroesophageal reflux disease)    Hyperlipidemia    Hypertension    Obesity    Sleep apnea    a. Does not use CPAP "cause i dont think I need it anymore".   Tobacco abuse    Type II diabetes mellitus (Sarah Charles)     Past Surgical History:  Procedure Laterality Date   CHOLECYSTECTOMY     CORONARY STENT INTERVENTION N/A 11/04/2017   Procedure: CORONARY STENT INTERVENTION;  Surgeon: Martinique, Peter M, MD;  Location: [place] [facility];  Service: Cardiovascular;  Laterality: N/A;   INCISION AND DRAINAGE ABSCESS Left 12/22/2012   Procedure: INCISION AND DRAINAGE ABSCESS;  Surgeon: Jamesetta So, MD;  Location: [facility];  Service: General;  Laterality: Left;   INTRAVASCULAR PRESSURE WIRE/FFR STUDY N/A 11/04/2017   Procedure: INTRAVASCULAR PRESSURE  WIRE/FFR STUDY;  Surgeon: Martinique, Peter M, MD;  Location: [place];  Service: Cardiovascular;  Laterality: N/A;   LEFT HEART CATH AND CORONARY ANGIOGRAPHY N/A 11/04/2017   Procedure: LEFT HEART CATH AND CORONARY ANGIOGRAPHY;  Surgeon: Martinique, Peter M, MD;  Location: [facility];  Service: Cardiovascular;  Laterality: N/A;   TONSILLECTOMY     TUBAL LIGATION      Family History  Problem Relation Age of Onset   Hypertension Mother    CVA Mother    COPD Mother    Heart disease Mother    Kidney Stones Mother    Heart attack Mother        MI in late 24's   Breast cancer Mother    CAD Father    Hypercholesterolemia Father    Heart attack Father        Died @ 17 of MI   Diabetes Sister    Cancer Maternal Uncle    Diabetes Maternal Grandmother    Cancer Maternal Grandfather        lung cancer   Colon cancer Neg Hx    Esophageal cancer Neg Hx    Stomach cancer Neg Hx    Pancreatic cancer Neg Hx     Allergies  Allergen Reactions   Cymbalta [Duloxetine Hcl] Nausea Only    Nausea, lack of therapeutic effect    Outpatient Medications Prior  to Visit  Medication Sig Dispense Refill   Alirocumab (PRALUENT) 75 MG/ML SOAJ Inject 75 mg into the skin every 14 (fourteen) days. 2 mL 2   aspirin EC 81 MG tablet Take 81 mg by mouth daily.     Blood Glucose Monitoring Suppl (ACCU-CHEK GUIDE ME) w/Device KIT 1 each by Does not apply route 3 (three) times daily. 1 kit 0   cetirizine (ZYRTEC) 10 MG tablet TAKE 1 TABLET BY MOUTH EVERY DAY 30 tablet 2   clotrimazole-betamethasone (LOTRISONE) cream Apply 1 application topically 2 (two) times daily. 30 g 0   COVID-19 mRNA bivalent vaccine, Pfizer, (PFIZER COVID-19 VAC BIVALENT) injection Inject into the muscle. 0.3 mL 0   dicyclomine (BENTYL) 10 MG capsule Take 1 capsule by mouth daily.     glucose blood (ACCU-CHEK GUIDE) test strip USE AS DIRECTED twice daily 100 strip 2   Insulin Pen Needle (B-D UF III MINI PEN NEEDLES) 31G X 5 MM  MISC USE AS DIRECTED (Patient taking differently: 1 each by Other route as directed.) 100 each 3   Lancets Misc. (ACCU-CHEK FASTCLIX LANCET) KIT USE AS DIRECTED (Patient taking differently: 1 each by Other route as directed.) 1 kit 0   metoCLOPramide (REGLAN) 10 MG tablet TAKE 1 TABLET BY MOUTH EVERY EIGHT HOURS AS NEEDED FOR NAUSEA OR VOMITING. 40 tablet 0   metroNIDAZOLE (METROGEL VAGINAL) 0.75 % vaginal gel Place 1 Applicatorful vaginally at bedtime. 70 g 0   Multiple Vitamin (MULTIVITAMIN) tablet Take 1 tablet by mouth daily.     mupirocin ointment (BACTROBAN) 2 % Apply 1 application topically 2 (two) times daily. X 7 days at the first sign of skin infection 22 g 0   nitroGLYCERIN (NITROSTAT) 0.4 MG SL tablet Place 0.4 mg under the tongue every 5 (five) minutes as needed for chest pain.     pantoprazole (PROTONIX) 40 MG tablet Take 1 tablet (40 mg total) by mouth daily. 90 tablet 2   rosuvastatin (CRESTOR) 40 MG tablet Take 1 tablet (40 mg total) by mouth daily. 90 tablet 1   vitamin B-12 (CYANOCOBALAMIN) 500 MCG tablet TAKE ONE TABLET BY MOUTH DAILY 30 tablet 3   vitamin C (ASCORBIC ACID) 500 MG tablet Take 500 mg by mouth daily.     amLODipine (NORVASC) 5 MG tablet Take 1 tablet (5 mg total) by mouth daily. 180 tablet 1   buPROPion (WELLBUTRIN SR) 150 MG 12 hr tablet Take 1 tablet (150 mg total) by mouth 2 (two) times daily. 180 tablet 1   fenofibrate (TRICOR) 145 MG tablet Take 1 tablet (145 mg total) by mouth daily. 90 tablet 3   FLUoxetine (PROZAC) 20 MG capsule TAKE 1 CAPSULE BY MOUTH EVERY DAY 90 capsule 1   gabapentin (NEURONTIN) 300 MG capsule TAKE ONE CAPSULE BY MOUTH twice daily 60 capsule 2   insulin glargine (LANTUS SOLOSTAR) 100 UNIT/ML Solostar Pen Inject 60 Units into the skin at bedtime. (Patient taking differently: Inject 55 Units into the skin at bedtime.) 30 mL 3   liraglutide (VICTOZA) 18 MG/3ML SOPN INJECT 1.53m SUBCUTANEOUSLY ONCE A DAY 6 mL 3   lisinopril (ZESTRIL) 20 MG  tablet Take 1 tablet (20 mg total) by mouth daily. 90 tablet 1   meloxicam (MOBIC) 7.5 MG tablet Take 1 tablet (7.5 mg total) by mouth daily. 10 tablet 0   methocarbamol (ROBAXIN) 500 MG tablet Take 1 tablet (500 mg total) by mouth 2 (two) times daily as needed (back pain). 30 tablet 0  metoprolol tartrate (LOPRESSOR) 25 MG tablet Take 1 tablet (25 mg total) by mouth 2 (two) times daily. 180 tablet 3   sitaGLIPtin-metformin (JANUMET) 50-1000 MG tablet Take 1 tablet by mouth 2 (two) times daily with a meal. 180 tablet 1   traZODone (DESYREL) 50 MG tablet TAKE ONE TABLET BY MOUTH EVERYDAY AT BEDTIME 90 tablet 1   No facility-administered medications prior to visit.     ROS Review of Systems  Constitutional:  Negative for activity change, appetite change and fatigue.  HENT:  Negative for congestion, sinus pressure and sore throat.   Eyes:  Negative for visual disturbance.  Respiratory:  Negative for cough, chest tightness, shortness of breath and wheezing.   Cardiovascular:  Negative for chest pain and palpitations.  Gastrointestinal:  Negative for abdominal distention, abdominal pain and constipation.  Endocrine: Negative for polydipsia.  Genitourinary:  Negative for dysuria and frequency.  Musculoskeletal:        See HPI  Skin:  Negative for rash.  Neurological:  Negative for tremors, light-headedness and numbness.  Hematological:  Does not bruise/bleed easily.  Psychiatric/Behavioral:  Negative for agitation and behavioral problems.    Objective:  BP 134/75    Pulse 61    Ht '5\' 3"'  (1.6 m)    Wt 179 lb 6.4 oz (81.4 kg)    SpO2 97%    BMI 31.78 kg/m   BP/Weight 05/05/2021 03/18/2021 2/77/8242  Systolic BP 353 614 431  Diastolic BP 75 80 88  Wt. (Lbs) 179.4 173.1 166.5  BMI 31.78 30.66 29.49      Physical Exam Constitutional:      Appearance: She is well-developed.  Cardiovascular:     Rate and Rhythm: Normal rate.     Heart sounds: Normal heart sounds. No murmur  heard. Pulmonary:     Effort: Pulmonary effort is normal.     Breath sounds: Normal breath sounds. No wheezing or rales.  Chest:     Chest wall: No tenderness.  Abdominal:     General: Bowel sounds are normal. There is no distension.     Palpations: Abdomen is soft. There is no mass.     Tenderness: There is no abdominal tenderness.  Musculoskeletal:        General: Normal range of motion.     Right lower leg: No edema.     Left lower leg: No edema.  Neurological:     Mental Status: She is alert and oriented to person, place, and time.  Psychiatric:        Mood and Affect: Mood normal.    CMP Latest Ref Rng & Units 11/13/2020 10/08/2020 08/14/2020  Glucose 70 - 99 mg/dL 417(H) 486(H) 239(H)  BUN 6 - 20 mg/dL 24(H) 10 16  Creatinine 0.44 - 1.00 mg/dL 0.69 0.56 0.50(L)  Sodium 135 - 145 mmol/L 132(L) 134(L) 137  Potassium 3.5 - 5.1 mmol/L 4.5 3.6 4.4  Chloride 98 - 111 mmol/L 97(L) 99 97  CO2 22 - 32 mmol/L '23 26 23  ' Calcium 8.9 - 10.3 mg/dL 10.0 9.4 9.5  Total Protein 6.5 - 8.1 g/dL 6.8 6.9 6.4  Total Bilirubin 0.3 - 1.2 mg/dL 1.2 0.5 <0.2  Alkaline Phos 38 - 126 U/L 107 96 124(H)  AST 15 - 41 U/L '23 15 12  ' ALT 0 - 44 U/L '6 15 10    ' Lipid Panel     Component Value Date/Time   CHOL 208 (H) 08/14/2020 1124   TRIG 187 (H) 08/14/2020 1124  HDL 53 08/14/2020 1124   CHOLHDL 3.9 08/14/2020 1124   CHOLHDL 9.7 (H) 07/23/2016 1638   VLDL NOT CALC 07/23/2016 1638   LDLCALC 122 (H) 08/14/2020 1124    CBC    Component Value Date/Time   WBC 7.1 11/13/2020 1230   RBC 5.15 (H) 11/13/2020 1230   HGB 15.9 (H) 11/13/2020 1230   HGB 14.2 11/09/2018 1158   HCT 46.7 (H) 11/13/2020 1230   HCT 42.8 11/09/2018 1158   PLT 305 11/13/2020 1230   PLT 321 11/09/2018 1158   MCV 90.7 11/13/2020 1230   MCV 93 11/09/2018 1158   MCH 30.9 11/13/2020 1230   MCHC 34.0 11/13/2020 1230   RDW 12.5 11/13/2020 1230   RDW 12.5 11/09/2018 1158   LYMPHSABS 2.6 11/13/2020 1230   LYMPHSABS 2.0  11/09/2018 1158   MONOABS 0.6 11/13/2020 1230   EOSABS 0.1 11/13/2020 1230   EOSABS 0.1 11/09/2018 1158   BASOSABS 0.1 11/13/2020 1230   BASOSABS 0.1 11/09/2018 1158    Lab Results  Component Value Date   HGBA1C 7.2 (A) 05/05/2021    Assessment & Plan:  1. Hyperlipidemia associated with type 2 diabetes mellitus ([place]) LDL is 122 above goal of less than 70 She is currently on a statin Praluent also prescribed which she is yet to commence. She has been advised to present to the cardiology clinic for education regarding administration - fenofibrate (TRICOR) 145 MG tablet; Take 1 tablet (145 mg total) by mouth daily.  Dispense: 90 tablet; Refill: 3  2. Diabetic polyneuropathy associated with type 2 diabetes mellitus (HCC) Stable - gabapentin (NEURONTIN) 300 MG capsule; Take 1 capsule (300 mg total) by mouth 2 (two) times daily.  Dispense: 180 capsule; Refill: 1  3. Type 2 diabetes mellitus with other specified complication, with long-term current use of insulin ([place]) Commended on significant improvement of A1c of 7.2 down from 11.3 Continue current management Counseled on Diabetic diet, my plate method, 314 minutes of moderate intensity exercise/week Blood sugar logs with fasting goals of 80-120 mg/dl, random of less than 180 and in the event of sugars less than 60 mg/dl or greater than 400 mg/dl encouraged to notify the clinic. Advised on the need for annual eye exams, annual foot exams, Pneumonia vaccine. - POCT glucose (manual entry) - POCT glycosylated hemoglobin (Hb H7W) - Basic Metabolic Panel - insulin glargine (LANTUS SOLOSTAR) 100 UNIT/ML Solostar Pen; Inject 50 Units into the skin at bedtime.  Dispense: 30 mL; Refill: 3 - liraglutide (VICTOZA) 18 MG/3ML SOPN; INJECT 1.82m SUBCUTANEOUSLY ONCE A DAY  Dispense: 6 mL; Refill: 3 - sitaGLIPtin-metformin (JANUMET) 50-1000 MG tablet; Take 1 tablet by mouth 2 (two) times daily with a meal.  Dispense: 180 tablet; Refill: 1  4.  Hypertension associated with diabetes ([place] Controlled Counseled on blood pressure goal of less than 130/80, low-sodium, DASH diet, medication compliance, 150 minutes of moderate intensity exercise per week. Discussed medication compliance, adverse effects. - amLODipine (NORVASC) 5 MG tablet; Take 1 tablet (5 mg total) by mouth daily.  Dispense: 180 tablet; Refill: 1 - lisinopril (ZESTRIL) 20 MG tablet; Take 1 tablet (20 mg total) by mouth daily.  Dispense: 90 tablet; Refill: 1 - metoprolol tartrate (LOPRESSOR) 25 MG tablet; Take 1 tablet (25 mg total) by mouth 2 (two) times daily.  Dispense: 180 tablet; Refill: 1  5. Depression, unspecified depression type Stable - buPROPion (WELLBUTRIN SR) 150 MG 12 hr tablet; Take 1 tablet (150 mg total) by mouth 2 (two) times daily.  Dispense: 180 tablet; Refill: 1 - FLUoxetine (PROZAC) 20 MG capsule; TAKE 1 CAPSULE BY MOUTH EVERY DAY  Dispense: 90 capsule; Refill: 1 - traZODone (DESYREL) 50 MG tablet; TAKE ONE TABLET BY MOUTH EVERYDAY AT BEDTIME  Dispense: 90 tablet; Refill: 1  6. Coronary artery disease involving native coronary artery of native heart without angina pectoris Asymptomatic Risk factor modification Continue statin, beta-blocker Followed by cardiology  7. Screening for colon cancer - Cologuard  8. Primary osteoarthritis of both hands Uncontrolled Will place on topical NSAID; holding off on oral NSAIDs due to the fact that she is on Praluent - diclofenac Sodium (VOLTAREN) 1 % GEL; Apply 4 g topically 4 (four) times daily.  Dispense: 100 g; Refill: 1  9. Low back pain without sciatica, unspecified back pain laterality, unspecified chronicity Declines referral for PT Advised to apply heat or ice whichever is tolerated to painful areas. Counseled on evidence of improvement in pain control with regards to yoga, water aerobics, massage, home physical therapy, exercise as tolerated.  - methocarbamol (ROBAXIN) 500 MG tablet; Take 1 tablet  (500 mg total) by mouth every 8 (eight) hours as needed (back pain).  Dispense: 90 tablet; Refill: 3   Meds ordered this encounter  Medications   methocarbamol (ROBAXIN) 500 MG tablet    Sig: Take 1 tablet (500 mg total) by mouth every 8 (eight) hours as needed (back pain).    Dispense:  90 tablet    Refill:  3   gabapentin (NEURONTIN) 300 MG capsule    Sig: Take 1 capsule (300 mg total) by mouth 2 (two) times daily.    Dispense:  180 capsule    Refill:  1    This prescription was filled on 02/18/2021. Any refills authorized will be placed on file.   insulin glargine (LANTUS SOLOSTAR) 100 UNIT/ML Solostar Pen    Sig: Inject 50 Units into the skin at bedtime.    Dispense:  30 mL    Refill:  3    Dose increase   amLODipine (NORVASC) 5 MG tablet    Sig: Take 1 tablet (5 mg total) by mouth daily.    Dispense:  180 tablet    Refill:  1    Dose increase   buPROPion (WELLBUTRIN SR) 150 MG 12 hr tablet    Sig: Take 1 tablet (150 mg total) by mouth 2 (two) times daily.    Dispense:  180 tablet    Refill:  1   fenofibrate (TRICOR) 145 MG tablet    Sig: Take 1 tablet (145 mg total) by mouth daily.    Dispense:  90 tablet    Refill:  3   FLUoxetine (PROZAC) 20 MG capsule    Sig: TAKE 1 CAPSULE BY MOUTH EVERY DAY    Dispense:  90 capsule    Refill:  1   liraglutide (VICTOZA) 18 MG/3ML SOPN    Sig: INJECT 1.48m SUBCUTANEOUSLY ONCE A DAY    Dispense:  6 mL    Refill:  3    This prescription was filled on 01/19/2021. Any refills authorized will be placed on file.   lisinopril (ZESTRIL) 20 MG tablet    Sig: Take 1 tablet (20 mg total) by mouth daily.    Dispense:  90 tablet    Refill:  1    DX Code Needed  .   metoprolol tartrate (LOPRESSOR) 25 MG tablet    Sig: Take 1 tablet (25 mg total) by mouth 2 (two) times daily.  Dispense:  180 tablet    Refill:  1   sitaGLIPtin-metformin (JANUMET) 50-1000 MG tablet    Sig: Take 1 tablet by mouth 2 (two) times daily with a meal.    Dispense:   180 tablet    Refill:  1   traZODone (DESYREL) 50 MG tablet    Sig: TAKE ONE TABLET BY MOUTH EVERYDAY AT BEDTIME    Dispense:  90 tablet    Refill:  1   diclofenac Sodium (VOLTAREN) 1 % GEL    Sig: Apply 4 g topically 4 (four) times daily.    Dispense:  100 g    Refill:  1     Follow-up: Return in about 3 months (around 08/03/2021) for Chronic medical conditions.       Charlott Rakes, MD, FAAFP. [facility] [place] [facility], [place]   05/05/2021, 4:00 PM

## 2021-05-05 NOTE — Patient Instructions (Signed)

## 2021-05-06 LAB — BASIC METABOLIC PANEL
BUN/Creatinine Ratio: 37 — ABNORMAL HIGH (ref 12–28)
BUN: 19 mg/dL (ref 8–27)
CO2: 27 mmol/L (ref 20–29)
Calcium: 9.6 mg/dL (ref 8.7–10.3)
Chloride: 101 mmol/L (ref 96–106)
Creatinine, Ser: 0.51 mg/dL — ABNORMAL LOW (ref 0.57–1.00)
Glucose: 123 mg/dL — ABNORMAL HIGH (ref 70–99)
Potassium: 3.7 mmol/L (ref 3.5–5.2)
Sodium: 142 mmol/L (ref 134–144)
eGFR: 107 mL/min/{1.73_m2} (ref >59)

## 2021-05-07 ENCOUNTER — Ambulatory Visit (INDEPENDENT_AMBULATORY_CARE_PROVIDER_SITE_OTHER): Payer: Medicare (Managed Care) | Admitting: Podiatry

## 2021-05-07 ENCOUNTER — Other Ambulatory Visit: Payer: Self-pay

## 2021-05-07 DIAGNOSIS — B351 Tinea unguium: Secondary | ICD-10-CM | POA: Diagnosis not present

## 2021-05-07 DIAGNOSIS — M79674 Pain in right toe(s): Secondary | ICD-10-CM

## 2021-05-07 DIAGNOSIS — M79675 Pain in left toe(s): Secondary | ICD-10-CM | POA: Diagnosis not present

## 2021-05-07 DIAGNOSIS — B353 Tinea pedis: Secondary | ICD-10-CM | POA: Diagnosis not present

## 2021-05-07 MED ORDER — TERBINAFINE HCL 250 MG PO TABS
ORAL_TABLET | ORAL | 0 refills | Status: DC
Start: 2021-05-07 — End: 2023-07-21

## 2021-05-08 ENCOUNTER — Telehealth: Payer: Self-pay | Admitting: Family Medicine

## 2021-05-08 ENCOUNTER — Telehealth: Payer: Self-pay

## 2021-05-08 DIAGNOSIS — Z1211 Encounter for screening for malignant neoplasm of colon: Secondary | ICD-10-CM

## 2021-05-08 NOTE — Telephone Encounter (Signed)
Attempt to call patient- no answer. Unable to leave message.

## 2021-05-08 NOTE — Telephone Encounter (Signed)
Pt was called and no vm is set up to leave a messgae.  Normal results letter has been mailed to patient. CRM created.

## 2021-05-08 NOTE — Progress Notes (Signed)
Subjective:   Patient ID: Sarah Charles, female   DOB: 60 y.o.   MRN: [id number]   HPI Patient presents poor health with dermatitis plantar aspect both feet with moccasin appearance within the arch and redness and thick yellow brittle nailbeds 1-5 both feet that she cannot cut and are painful   ROS      Objective:  Physical Exam  Neurovascular status is unchanged from previous visit with patient found to have significant what appears to be fungal infection plantar right that is not responded so far to topical and also left.  Has thick yellow brittle nailbeds 1-5 both feet that she cannot cut herself     Assessment:  Probability that this is a fungal infection affecting her feet and has not responded topically along with mycotic nail infection with pain 1-5 both feet     Plan:  H&P reviewed condition discussed fungal and we will get a try her on a pulse Lamisil and if this does not improve the condition I have advised her to see a dermatologist.  I also went ahead debrided nailbeds 1-5 both feet with no iatrogenic bleeding and reappoint for routine care

## 2021-05-08 NOTE — Telephone Encounter (Signed)
-----   Message from [name], MD sent at 05/06/2021 11:08 AM EST ----- Please inform the patient that labs are normal. Thank you.

## 2021-05-08 NOTE — Telephone Encounter (Signed)
Patient called in needs help on how to use cologuard box. Please call back.

## 2021-05-11 ENCOUNTER — Telehealth: Payer: Self-pay

## 2021-05-11 NOTE — Telephone Encounter (Signed)
Unable to reach, no answer.  No voicemail set up.

## 2021-05-11 NOTE — Telephone Encounter (Signed)
Pt calling back to learn how to use cologaurd requesting call back at [phone]

## 2021-05-11 NOTE — Telephone Encounter (Signed)
-----   Message from [name], MD sent at 05/06/2021 11:08 AM EST ----- Please inform the patient that labs are normal. Thank you.

## 2021-05-11 NOTE — Telephone Encounter (Signed)
Patient was called and a voicemail was left informing patient to return phone call for lab results. 

## 2021-05-12 NOTE — Addendum Note (Signed)
Addended by: [name] on: 05/12/2021 05:39 PM   Modules accepted: Orders

## 2021-05-12 NOTE — Telephone Encounter (Signed)
The other option is Colonoscopy and I have placed referral

## 2021-05-12 NOTE — Telephone Encounter (Signed)
Patient call was transferred over from [facility]. Patient states she does not understand the collection process for Cologuard.   Informed patient that I could help her with it. She states she would like another method of screening.   Advise patient that we will f/u with her for the next steps.

## 2021-05-13 NOTE — Telephone Encounter (Signed)
No answer or voicemail.   Attempt to call patient to inform that referral was placed for colonoscopy. GI specialist will call to schedule.

## 2021-05-14 ENCOUNTER — Ambulatory Visit: Payer: Medicare (Managed Care)

## 2021-06-11 ENCOUNTER — Other Ambulatory Visit: Payer: Self-pay | Admitting: Family Medicine

## 2021-06-11 NOTE — Telephone Encounter (Signed)
Requested Prescriptions  Pending Prescriptions Disp Refills   vitamin B-12 (CYANOCOBALAMIN) 500 MCG tablet [Pharmacy Med Name: cyanocobalamin (vit B-12) 500 mcg tablet] 30 tablet 3    Sig: TAKE ONE TABLET BY MOUTH ONCE DAILY     Off-Protocol Failed - 06/11/2021  8:02 AM      Failed - Medication not assigned to a protocol, review manually.      Passed - Valid encounter within last 12 months    Recent Outpatient Visits          1 month ago Hyperlipidemia associated with type 2 diabetes mellitus (HCC)   [facility] [place], Odette Horns, MD   5 months ago Annual physical exam   [facility] [place], Odette Horns, MD   7 months ago Uncontrolled type 2 diabetes mellitus with complication, with long-term current use of insulin [facility])   [facility] [place], [place], [place]   7 months ago Other fatigue   [facility] [place], [place], MD   9 months ago Low back pain without sciatica, unspecified back pain laterality, unspecified chronicity   [facility] [address] [facility] [place], Marzella Schlein, [place]      Future Appointments            In 1 week Dan Humphreys, Storm Frisk, NP [facility]   In 1 month [name], MD [facility]

## 2021-06-14 ENCOUNTER — Other Ambulatory Visit (HOSPITAL_BASED_OUTPATIENT_CLINIC_OR_DEPARTMENT_OTHER): Payer: Self-pay | Admitting: Family

## 2021-06-15 NOTE — Telephone Encounter (Signed)
Praulent refill please

## 2021-06-19 ENCOUNTER — Ambulatory Visit (HOSPITAL_BASED_OUTPATIENT_CLINIC_OR_DEPARTMENT_OTHER): Payer: Medicaid Other | Admitting: Family

## 2021-06-19 ENCOUNTER — Ambulatory Visit: Payer: Self-pay | Admitting: *Deleted

## 2021-06-19 NOTE — Telephone Encounter (Signed)
Reason for Disposition  Back pain  Answer Assessment - Initial Assessment Questions 1. ONSET: "When did the pain begin?"      Chronic pain- arthritis 2. LOCATION: "Where does it hurt?" (upper, mid or lower back)     Lower bacl 3. SEVERITY: "How bad is the pain?"  (e.g., Scale 1-10; mild, moderate, or severe)   - MILD (1-3): doesn't interfere with normal activities    - MODERATE (4-7): interferes with normal activities or awakens from sleep    - SEVERE (8-10): excruciating pain, unable to do any normal activities      severe 4. PATTERN: "Is the pain constant?" (e.g., yes, no; constant, intermittent)      constant 5. RADIATION: "Does the pain shoot into your legs or elsewhere?"     *No Answer* 6. CAUSE:  "What do you think is causing the back pain?"      Arthritis- patient was given muscle relaxer 7. BACK OVERUSE:  "Any recent lifting of heavy objects, strenuous work or exercise?"     *No Answer* 8. MEDICATIONS: "What have you taken so far for the pain?" (e.g., nothing, acetaminophen, NSAIDS)     Out of medication- patient is awaiting a RF 9. NEUROLOGIC SYMPTOMS: "Do you have any weakness, numbness, or problems with bowel/bladder control?"     *No Answer* 10. OTHER SYMPTOMS: "Do you have any other symptoms?" (e.g., fever, abdominal pain, burning with urination, blood in urine)       *No Answer* 11. PREGNANCY: "Is there any chance you are pregnant?" (e.g., yes, no; LMP)       *No Answer*  Protocols used: Back Pain-A-AH

## 2021-06-19 NOTE — Telephone Encounter (Signed)
°  Chief Complaint: chronic back pain- out of medication Symptoms: back pain due to arthritis  Frequency: chronic Pertinent Negatives: Patient denies   Disposition: [] ED /[] Urgent Care (no appt availability in office) / [] Appointment(In office/virtual)/ []  [facility]/ [x] Home Care/ [] Refused Recommended Disposition /[] [facility]/ []  Follow-up with PCP Additional Notes: Per chart- patient should have RF- call to pharmacy- she does have RF and they will fill and deliver for her. Patient notified- she is grateful for the help- patient advised call back if medication does not help or she get worse.

## 2021-06-24 ENCOUNTER — Ambulatory Visit
Admission: RE | Admit: 2021-06-24 | Discharge: 2021-06-24 | Disposition: A | Discharge status: Home / Self Care | Payer: Medicare (Managed Care) | Source: Ambulatory Visit | Attending: Family Medicine | Admitting: Family Medicine

## 2021-06-24 DIAGNOSIS — Z1231 Encounter for screening mammogram for malignant neoplasm of breast: Secondary | ICD-10-CM

## 2021-06-25 ENCOUNTER — Telehealth: Payer: Self-pay

## 2021-06-25 NOTE — Telephone Encounter (Signed)
-----   Message from Charlott Rakes, MD sent at 06/25/2021  2:51 PM EST ----- Please inform her that her mammogram is normal

## 2021-06-25 NOTE — Telephone Encounter (Signed)
Pt was called and their is no VM set up to leave a message.

## 2021-07-09 ENCOUNTER — Other Ambulatory Visit: Payer: Self-pay | Admitting: Physician Assistant

## 2021-07-09 DIAGNOSIS — K219 Gastro-esophageal reflux disease without esophagitis: Secondary | ICD-10-CM

## 2021-07-09 NOTE — Telephone Encounter (Signed)
Already filled 06/19/21 . 90 day supply.  Requesting by interface surescripts. Requested Prescriptions  Refused Prescriptions Disp Refills   pantoprazole (PROTONIX) 40 MG tablet [Pharmacy Med Name: pantoprazole 40 mg tablet,delayed release] 90 tablet 2    Sig: TAKE ONE TABLET BY MOUTH ONCE DAILY     Gastroenterology: Proton Pump Inhibitors Passed - 07/09/2021  8:03 AM      Passed - Valid encounter within last 12 months    Recent Outpatient Visits          2 months ago Hyperlipidemia associated with type 2 diabetes mellitus ([place])   [place], Charlane Ferretti, MD   6 months ago Annual physical exam   [place], [place], MD   7 months ago Uncontrolled type 2 diabetes mellitus with complication, with long-term current use of insulin [facility])   [place], [place], [place]   8 months ago Other fatigue   [place], [place], MD   10 months ago Low back pain without sciatica, unspecified back pain laterality, unspecified chronicity   [place], [place]      Future Appointments            In 3 weeks Charlott Rakes, MD [place]

## 2021-07-17 ENCOUNTER — Other Ambulatory Visit: Payer: Self-pay | Admitting: Physician Assistant

## 2021-07-17 DIAGNOSIS — K219 Gastro-esophageal reflux disease without esophagitis: Secondary | ICD-10-CM

## 2021-07-20 ENCOUNTER — Ambulatory Visit (HOSPITAL_COMMUNITY)
Admission: EM | Admit: 2021-07-20 | Discharge: 2021-07-20 | Disposition: A | Discharge status: Home / Self Care | Payer: Medicare (Managed Care) | Attending: Urgent Care | Admitting: Urgent Care

## 2021-07-20 ENCOUNTER — Encounter (HOSPITAL_COMMUNITY): Payer: Self-pay | Admitting: Emergency Medicine

## 2021-07-20 ENCOUNTER — Other Ambulatory Visit: Payer: Self-pay

## 2021-07-20 DIAGNOSIS — H6123 Impacted cerumen, bilateral: Secondary | ICD-10-CM | POA: Diagnosis not present

## 2021-07-20 DIAGNOSIS — H9201 Otalgia, right ear: Secondary | ICD-10-CM | POA: Diagnosis not present

## 2021-07-20 DIAGNOSIS — M7918 Myalgia, other site: Secondary | ICD-10-CM | POA: Diagnosis not present

## 2021-07-20 MED ORDER — BACLOFEN 10 MG PO TABS
ORAL_TABLET | ORAL | 0 refills | Status: DC
Start: 2021-07-20 — End: 2022-02-25

## 2021-07-20 MED ORDER — NAPROXEN 375 MG PO TABS: 375.0000 mg | ORAL_TABLET | Freq: Two times a day (BID) | ORAL | 0 refills | Status: DC

## 2021-07-20 NOTE — ED Triage Notes (Signed)
Right ear pain, into right side of throat, shoulder and right arm.  Onset yesterday.  Denies any other symptoms

## 2021-07-20 NOTE — Discharge Instructions (Signed)
Your ear is not infected.  Your earwax was removed successfully. Your pain is likely coming from muscle tension or spasms.   Please purchase a microwavable heat pack in place over your left shoulder and neck up to 3 times daily. Stop your Robaxin, I have changed this to a muscle relaxer that will be more effective. Take the naproxen as prescribed, with food, as needed.

## 2021-07-20 NOTE — ED Provider Notes (Signed)
[place]    CSN: [id number] Arrival date & time: 07/20/21  1856      History   Chief Complaint Chief Complaint  Patient presents with   Otalgia    HPI Sarah Charles is a 61 y.o. female.   Pleasant 61 year old female presents today with primary concern of right ear pain.  She states she woke up yesterday and had a [place] stabbing pain in her right ear.  She states she took an over-the-counter medication without resolution of her symptoms.  Upon awakening this morning, she states however the pain is also in her shoulder, neck, and arm.  She describes it as a constant [place] pain, with intermittent twinges or stabs.  She denies any radicular symptoms.  She denies any chest pain.  She denies any shortness of breath.  She does admit to having slept on a new pillow recently and feels she might of kinked her neck.  She denies any hearing loss or drainage from her ear.  She denies any headache.  She does report that rotation of her head side to side does cause some discomfort.  She has not tried any treatments today. Denies nucchal rigidity, headache, photophobia, pain behind her ear.   Otalgia  Past Medical History:  Diagnosis Date   Arthritis    Coronary artery calcification seen on CT scan    a. 07/2017 noted on CT abd.   Depression    GERD (gastroesophageal reflux disease)    Hyperlipidemia    Hypertension    Obesity    Sleep apnea    a. Does not use CPAP "cause i dont think I need it anymore".   Tobacco abuse    Type II diabetes mellitus ([place])     Patient Active Problem List   Diagnosis Date Noted   Sleep apnea, unspecified 11/26/2020   CAD (coronary artery disease) 11/22/2017   Chest pain, precordial 11/04/2017   Acute chest pain    Type 2 diabetes mellitus with hyperlipidemia ([place]) 10/29/2017   Unstable angina ([place]) 10/29/2017   Diabetic polyneuropathy associated with type 2 diabetes mellitus ([place]) 05/30/2017   Mixed dyslipidemia 05/30/2017   Depression  05/31/2016   Hypertension 05/31/2016   Uncontrolled type 2 diabetes mellitus with complication 83/38/2505   Uncontrolled type 2 diabetes mellitus with complication, with long-term current use of insulin 04/20/2015   Cellulitis of breast 12/29/2013   Diabetes mellitus ([place]) 12/29/2013   Cellulitis of female breast 12/29/2013   Symptomatic menopausal or female climacteric states 04/08/2008   OTHER SPECIFIED DISEASE OF NAIL 04/08/2008   URINARY URGENCY 04/08/2008   COUGH 04/02/2008   HYPERTENSION, BENIGN ESSENTIAL 04/12/2007   FATIGUE 04/12/2007   OBESITY, NOS 07/21/2006   Major depressive disorder, recurrent episode ([place]) 07/21/2006   TOBACCO DEPENDENCE 07/21/2006   GASTROESOPHAGEAL REFLUX, NO ESOPHAGITIS 07/21/2006   INCONTINENCE, STRESS, FEMALE 07/21/2006    Past Surgical History:  Procedure Laterality Date   CHOLECYSTECTOMY     CORONARY STENT INTERVENTION N/A 11/04/2017   Procedure: CORONARY STENT INTERVENTION;  Surgeon: Martinique, Peter M, MD;  Location: [place] [facility];  Service: Cardiovascular;  Laterality: N/A;   INCISION AND DRAINAGE ABSCESS Left 12/22/2012   Procedure: INCISION AND DRAINAGE ABSCESS;  Surgeon: Jamesetta So, MD;  Location: [facility];  Service: General;  Laterality: Left;   INTRAVASCULAR PRESSURE WIRE/FFR STUDY N/A 11/04/2017   Procedure: INTRAVASCULAR PRESSURE WIRE/FFR STUDY;  Surgeon: Martinique, Peter M, MD;  Location: [place] [facility];  Service: Cardiovascular;  Laterality: N/A;  LEFT HEART CATH AND CORONARY ANGIOGRAPHY N/A 11/04/2017   Procedure: LEFT HEART CATH AND CORONARY ANGIOGRAPHY;  Surgeon: Martinique, Peter M, MD;  Location: [place] [facility];  Service: Cardiovascular;  Laterality: N/A;   TONSILLECTOMY     TUBAL LIGATION      OB History   No obstetric history on file.      Home Medications    Prior to Admission medications   Medication Sig Start Date End Date Taking? Authorizing Provider  baclofen (LIORESAL) 10 MG tablet Take one tab PO TID PRN  pain/ spasm 07/20/21  Yes Matteo Banke L, PA  naproxen (NAPROSYN) 375 MG tablet Take 1 tablet (375 mg total) by mouth 2 (two) times daily with a meal. 07/20/21  Yes Harrison Zetina L, PA  amLODipine (NORVASC) 5 MG tablet Take 1 tablet (5 mg total) by mouth daily. 05/05/21 08/03/21  Charlott Rakes, MD  aspirin EC 81 MG tablet Take 81 mg by mouth daily.    [provider]  Blood Glucose Monitoring Suppl (ACCU-CHEK GUIDE ME) w/Device KIT 1 each by Does not apply route 3 (three) times daily. 11/09/18   [name], PA-C  buPROPion (WELLBUTRIN SR) 150 MG 12 hr tablet Take 1 tablet (150 mg total) by mouth 2 (two) times daily. 05/05/21   Charlott Rakes, MD  cetirizine (ZYRTEC) 10 MG tablet TAKE 1 TABLET BY MOUTH EVERY DAY 08/02/19   Charlott Rakes, MD  clotrimazole-betamethasone (LOTRISONE) cream Apply 1 application topically 2 (two) times daily. 11/28/20   Felipa Furnace, DPM  COVID-19 mRNA bivalent vaccine, Pfizer, (PFIZER COVID-19 VAC BIVALENT) injection Inject into the muscle. 03/18/21   Carlyle Basques, MD  diclofenac Sodium (VOLTAREN) 1 % GEL Apply 4 g topically 4 (four) times daily. 05/05/21   Charlott Rakes, MD  dicyclomine (BENTYL) 10 MG capsule Take 1 capsule by mouth daily. 05/26/18   [provider]  fenofibrate (TRICOR) 145 MG tablet Take 1 tablet (145 mg total) by mouth daily. 05/05/21   Charlott Rakes, MD  FLUoxetine (PROZAC) 20 MG capsule TAKE 1 CAPSULE BY MOUTH EVERY DAY 05/05/21   Charlott Rakes, MD  gabapentin (NEURONTIN) 300 MG capsule Take 1 capsule (300 mg total) by mouth 2 (two) times daily. 05/05/21   Charlott Rakes, MD  glucose blood (ACCU-CHEK GUIDE) test strip USE AS DIRECTED twice daily 04/02/21   Charlott Rakes, MD  insulin glargine (LANTUS SOLOSTAR) 100 UNIT/ML Solostar Pen Inject 50 Units into the skin at bedtime. 05/05/21   Charlott Rakes, MD  Insulin Pen Needle (B-D UF III MINI PEN NEEDLES) 31G X 5 MM MISC USE AS DIRECTED Patient taking differently:  1 each by Other route as directed. 08/14/20   [name], PA-C  Lancets Misc. (ACCU-CHEK FASTCLIX LANCET) KIT USE AS DIRECTED Patient taking differently: 1 each by Other route as directed. 09/11/20   Charlott Rakes, MD  liraglutide (VICTOZA) 18 MG/3ML SOPN INJECT 1.64m SUBCUTANEOUSLY ONCE A DAY 05/05/21   NCharlott Rakes MD  lisinopril (ZESTRIL) 20 MG tablet Take 1 tablet (20 mg total) by mouth daily. 05/05/21   NCharlott Rakes MD  metoCLOPramide (REGLAN) 10 MG tablet TAKE 1 TABLET BY MOUTH EVERY EIGHT HOURS AS NEEDED FOR NAUSEA OR VOMITING. 08/14/20   MArgentina Donovan PA-C  metoprolol tartrate (LOPRESSOR) 25 MG tablet Take 1 tablet (25 mg total) by mouth 2 (two) times daily. 05/05/21   NCharlott Rakes MD  metroNIDAZOLE (METROGEL VAGINAL) 0.75 % vaginal gel Place 1 Applicatorful vaginally at bedtime. 12/29/20  Charlott Rakes, MD  Multiple Vitamin (MULTIVITAMIN) tablet Take 1 tablet by mouth daily.    [provider]  mupirocin ointment (BACTROBAN) 2 % Apply 1 application topically 2 (two) times daily. X 7 days at the first sign of skin infection 08/14/20   [name], PA-C  nitroGLYCERIN (NITROSTAT) 0.4 MG SL tablet Place 0.4 mg under the tongue every 5 (five) minutes as needed for chest pain.    [provider]  pantoprazole (PROTONIX) 40 MG tablet TAKE ONE TABLET BY MOUTH ONCE DAILY 07/17/21   Charlott Rakes, MD  PRALUENT 75 MG/ML SOAJ INJECT 75 MG into THE SKIN every 14 DAYS 06/16/21   Loel Dubonnet, NP  rosuvastatin (CRESTOR) 40 MG tablet Take 1 tablet (40 mg total) by mouth daily. 04/15/21   Charlott Rakes, MD  sitaGLIPtin-metformin (JANUMET) 50-1000 MG tablet Take 1 tablet by mouth 2 (two) times daily with a meal. 05/05/21   Charlott Rakes, MD  terbinafine (LAMISIL) 250 MG tablet Please take one a day x 7days, repeat every 4 weeks x 4 months 05/07/21   Wallene Huh, DPM  traZODone (DESYREL) 50 MG tablet TAKE ONE TABLET BY MOUTH EVERYDAY AT BEDTIME  05/05/21   Charlott Rakes, MD  vitamin B-12 (CYANOCOBALAMIN) 500 MCG tablet TAKE ONE TABLET BY MOUTH ONCE DAILY 06/11/21   Charlott Rakes, MD  vitamin C (ASCORBIC ACID) 500 MG tablet Take 500 mg by mouth daily.    [provider]    Family History Family History  Problem Relation Age of Onset   Hypertension Mother    CVA Mother    COPD Mother    Heart disease Mother    Kidney Stones Mother    Heart attack Mother        MI in late 82's   Breast cancer Mother    CAD Father    Hypercholesterolemia Father    Heart attack Father        Died @ 43 of MI   Diabetes Sister    Cancer Maternal Uncle    Diabetes Maternal Grandmother    Cancer Maternal Grandfather        lung cancer   Colon cancer Neg Hx    Esophageal cancer Neg Hx    Stomach cancer Neg Hx    Pancreatic cancer Neg Hx     Social History Social History   Tobacco Use   Smoking status: Every Day    Packs/day: 1.00    Years: 41.00    Pack years: 41.00    Types: Cigarettes    Last attempt to quit: 10/07/2018    Years since quitting: 2.7   Smokeless tobacco: Never  Vaping Use   Vaping Use: Never used  Substance Use Topics   Alcohol use: No   Drug use: No     Allergies   Cymbalta [duloxetine hcl]   Review of Systems Review of Systems  HENT:  Positive for ear pain.   Musculoskeletal:  Positive for arthralgias.    Physical Exam Triage Vital Signs ED Triage Vitals  Enc Vitals Group     BP 07/20/21 1926 (!) 156/78     Pulse Rate 07/20/21 1926 60     Resp 07/20/21 1926 20     Temp 07/20/21 1926 (!) 97.5 F (36.4 C)     Temp Source 07/20/21 1926 Oral     SpO2 07/20/21 1926 96 %     Weight --      Height --  Head Circumference --      Peak Flow --      Pain Score 07/20/21 1924 10     Pain Loc --      Pain Edu? --      Excl. in [place]? --    No data found.  Updated Vital Signs BP (!) 156/78 (BP Location: Right Arm)    Pulse 60    Temp (!) 97.5 F (36.4 C) (Oral)    Resp 20    SpO2 96%    Visual Acuity Right Eye Distance:   Left Eye Distance:   Bilateral Distance:    Right Eye Near:   Left Eye Near:    Bilateral Near:     Physical Exam Vitals and nursing note reviewed.  Constitutional:      General: She is not in acute distress.    Appearance: Normal appearance. She is obese. She is not toxic-appearing or diaphoretic.  HENT:     Head: Normocephalic and atraumatic.     Right Ear: Tympanic membrane, ear canal and external ear normal. There is impacted cerumen.     Left Ear: Tympanic membrane, ear canal and external ear normal. There is impacted cerumen.     Ears:     Comments: Tms normal after ear wax removed    Nose: Nose normal. No congestion or rhinorrhea.     Mouth/Throat:     Mouth: Mucous membranes are moist.     Pharynx: Oropharynx is clear. No oropharyngeal exudate or posterior oropharyngeal erythema.  Eyes:     General: No scleral icterus.       Right eye: No discharge.        Left eye: No discharge.     Extraocular Movements: Extraocular movements intact.     Conjunctiva/sclera: Conjunctivae normal.     Pupils: Pupils are equal, round, and reactive to light.  Neck:     Vascular: No carotid bruit.  Cardiovascular:     Rate and Rhythm: Normal rate.     Pulses: Normal pulses.     Heart sounds: No murmur heard. Pulmonary:     Effort: Pulmonary effort is normal. No respiratory distress.     Breath sounds: Normal breath sounds. No stridor. No wheezing, rhonchi or rales.  Chest:     Chest wall: No tenderness.  Musculoskeletal:        General: No swelling, tenderness, deformity or signs of injury. Normal range of motion.     Cervical back: Normal range of motion and neck supple. No rigidity or tenderness.     Comments: Reproducible musculoskeletal pain to patient's right trapezius. Full range of motion of neck with pain going to shoulder upon lateral rotation.  Lymphadenopathy:     Cervical: No cervical adenopathy.  Skin:    General: Skin is warm.      Capillary Refill: Capillary refill takes less than 2 seconds.     Findings: No erythema or rash.  Neurological:     General: No focal deficit present.     Mental Status: She is alert and oriented to person, place, and time.     Cranial Nerves: No cranial nerve deficit.     Sensory: No sensory deficit.     Motor: No weakness.     Coordination: Coordination normal.     UC Treatments / Results  Labs (all labs ordered are listed, but only abnormal results are displayed) Labs Reviewed - No data to display  EKG   Radiology No results found.  Procedures Ear Cerumen Removal  Date/Time: 07/20/2021 8:43 PM Performed by: Chaney Malling, PA Authorized by: Chaney Malling, PA   Consent:    Consent obtained:  Verbal   Consent given by:  Patient   Risks, benefits, and alternatives were discussed: yes     Risks discussed:  Bleeding, infection, pain, TM perforation, incomplete removal and dizziness   Alternatives discussed:  No treatment Procedure details:    Location:  L ear and R ear   Procedure type: irrigation     Procedure outcomes: cerumen removed   Post-procedure details:    Hearing quality:  Normal   Procedure completion:  Tolerated (including critical care time)  Medications Ordered in UC Medications - No data to display  Initial Impression / Assessment and Plan / UC Course  I have reviewed the triage vital signs and the nursing notes.  Pertinent labs & imaging results that were available during my care of the patient were reviewed by me and considered in my medical decision making (see chart for details).     Myofascial pain syndrome -patient was having intermittent winces in her neck upon evaluation.  It looked as though she was having active spasms.  Her symptoms did worsen upon palpation of a large knot in her right trap.  I do suspect most of this to be musculoskeletal.  We will give short course of naproxen and baclofen, which would also cover if this was  developing TMJ.  She had no red flag symptoms. Right ear pain -the pain was not improved after removing the earwax.  Suspect this is referred from her right trap/shoulder Cerumen impaction -bilateral, removed in office with lavage.  Final Clinical Impressions(s) / UC Diagnoses   Final diagnoses:  Myofascial pain syndrome, cervical  Right ear pain  Bilateral impacted cerumen     Discharge Instructions      Your ear is not infected.  Your earwax was removed successfully. Your pain is likely coming from muscle tension or spasms.   Please purchase a microwavable heat pack in place over your left shoulder and neck up to 3 times daily. Stop your Robaxin, I have changed this to a muscle relaxer that will be more effective. Take the naproxen as prescribed, with food, as needed.    ED Prescriptions     Medication Sig Dispense Auth. Provider   naproxen (NAPROSYN) 375 MG tablet Take 1 tablet (375 mg total) by mouth 2 (two) times daily with a meal. 20 tablet Camilo Mander L, PA   baclofen (LIORESAL) 10 MG tablet Take one tab PO TID PRN pain/ spasm 30 each Tamla Winkels L, PA      PDMP not reviewed this encounter.   Chaney Malling, [place] 07/20/21 2044

## 2021-08-04 ENCOUNTER — Other Ambulatory Visit: Payer: Self-pay

## 2021-08-04 ENCOUNTER — Ambulatory Visit: Payer: Medicare (Managed Care) | Attending: Family Medicine | Admitting: Family Medicine

## 2021-08-04 ENCOUNTER — Encounter: Payer: Self-pay | Admitting: Family Medicine

## 2021-08-04 VITALS — BP 161/80 | HR 67 | Ht 63.0 in | Wt 191.0 lb

## 2021-08-04 DIAGNOSIS — E1169 Type 2 diabetes mellitus with other specified complication: Secondary | ICD-10-CM | POA: Diagnosis not present

## 2021-08-04 DIAGNOSIS — R058 Other specified cough: Secondary | ICD-10-CM

## 2021-08-04 DIAGNOSIS — M5441 Lumbago with sciatica, right side: Secondary | ICD-10-CM

## 2021-08-04 DIAGNOSIS — E1142 Type 2 diabetes mellitus with diabetic polyneuropathy: Secondary | ICD-10-CM

## 2021-08-04 DIAGNOSIS — F17210 Nicotine dependence, cigarettes, uncomplicated: Secondary | ICD-10-CM

## 2021-08-04 DIAGNOSIS — M5442 Lumbago with sciatica, left side: Secondary | ICD-10-CM

## 2021-08-04 DIAGNOSIS — Z794 Long term (current) use of insulin: Secondary | ICD-10-CM | POA: Diagnosis not present

## 2021-08-04 DIAGNOSIS — E1159 Type 2 diabetes mellitus with other circulatory complications: Secondary | ICD-10-CM

## 2021-08-04 DIAGNOSIS — G8929 Other chronic pain: Secondary | ICD-10-CM | POA: Diagnosis not present

## 2021-08-04 DIAGNOSIS — I152 Hypertension secondary to endocrine disorders: Secondary | ICD-10-CM

## 2021-08-04 LAB — POCT GLYCOSYLATED HEMOGLOBIN (HGB A1C): HbA1c, POC (controlled diabetic range): 6.8 % (ref 0.0–7.0)

## 2021-08-04 LAB — GLUCOSE, POCT (MANUAL RESULT ENTRY): POC Glucose: 74 mg/dl (ref 70–99)

## 2021-08-04 MED ORDER — KETOROLAC TROMETHAMINE 60 MG/2ML IM SOLN
60.0000 mg | Freq: Once | INTRAMUSCULAR | Status: AC
  Administered 2021-08-04: 60 mg via INTRAMUSCULAR

## 2021-08-04 MED ORDER — BENZONATATE 100 MG PO CAPS
100.0000 mg | ORAL_CAPSULE | Freq: Two times a day (BID) | ORAL | 0 refills | Status: DC | PRN
Start: 2021-08-04 — End: 2021-09-29

## 2021-08-04 MED ORDER — AMLODIPINE BESYLATE 10 MG PO TABS: 10.0000 mg | ORAL_TABLET | Freq: Every day | ORAL | 1 refills | Status: DC

## 2021-08-04 MED ORDER — GABAPENTIN 300 MG PO CAPS: 600.0000 mg | ORAL_CAPSULE | Freq: Two times a day (BID) | ORAL | 1 refills | Status: DC

## 2021-08-04 NOTE — Patient Instructions (Signed)

## 2021-08-04 NOTE — Progress Notes (Signed)
? ?Subjective:  ?Patient ID: Sarah Charles, female    DOB: [birth year]  Age: 61 y.o. MRN: [id number] ? ?CC: Diabetes ? ? ?HPI ?Sarah Charles is a 61 y.o. year old female with a history of type 2 diabetes mellitus (A1c 6.8), hypertension, hyperlipidemia, GERD, CAD (s/p DES). ?  ? ?Interval History: ?Today she complains of cough pain, back pain. ?Cough which is chronic and dry. She has mucus in her throat but has no dyspnea, wheezing, orthopnea, paroxysmal nocturnal dyspnea or pedal edema. ?Smokes half a pack /day since the age of 18. Not ready to quit smoking ? ?Back pain radiates to both legs and can get to 9/10; symptoms have been present for close to a year and it hurts so bad she wants to cry. She has associated numbness and tingling in legs but no falls, does not use a cane. Sometimes it causes her knees to bend ?Did physical therapy in the past which was not helpful. ?She has arthritis in her hands she states. ? ?With regards to her chronic medical conditions she has not been taking Praluent because she did not know what was prescribed for but has been compliant with Crestor. ?Her diabetes is controlled with an A1c of 6.8, she has no hypoglycemia, numbness in extremities or visual concerns. ?Compliant with her antihypertensive and her statin. ? ? ? ?Past Medical History:  ?Diagnosis Date  ? Arthritis   ? Coronary artery calcification seen on CT scan   ? a. 07/2017 noted on CT abd.  ? Depression   ? GERD (gastroesophageal reflux disease)   ? Hyperlipidemia   ? Hypertension   ? Obesity   ? Sleep apnea   ? a. Does not use CPAP "cause i dont think I need it anymore".  ? Tobacco abuse   ? Type II diabetes mellitus ([place])   ? ? ?Past Surgical History:  ?Procedure Laterality Date  ? CHOLECYSTECTOMY    ? CORONARY STENT INTERVENTION N/A 11/04/2017  ? Procedure: CORONARY STENT INTERVENTION;  Surgeon: Martinique, Peter M, MD;  Location: [facility];  Service: Cardiovascular;  Laterality: N/A;  ? INCISION AND DRAINAGE ABSCESS  Left 12/22/2012  ? Procedure: INCISION AND DRAINAGE ABSCESS;  Surgeon: Jamesetta So, MD;  Location: [facility];  Service: General;  Laterality: Left;  ? INTRAVASCULAR PRESSURE WIRE/FFR STUDY N/A 11/04/2017  ? Procedure: INTRAVASCULAR PRESSURE WIRE/FFR STUDY;  Surgeon: Martinique, Peter M, MD;  Location: [facility];  Service: Cardiovascular;  Laterality: N/A;  ? LEFT HEART CATH AND CORONARY ANGIOGRAPHY N/A 11/04/2017  ? Procedure: LEFT HEART CATH AND CORONARY ANGIOGRAPHY;  Surgeon: Martinique, Peter M, MD;  Location: [facility];  Service: Cardiovascular;  Laterality: N/A;  ? TONSILLECTOMY    ? TUBAL LIGATION    ? ? ?Family History  ?Problem Relation Age of Onset  ? Hypertension Mother   ? CVA Mother   ? COPD Mother   ? Heart disease Mother   ? Kidney Stones Mother   ? Heart attack Mother   ?     MI in late 50's  ? Breast cancer Mother   ? CAD Father   ? Hypercholesterolemia Father   ? Heart attack Father   ?     Died @ 82 of MI  ? Diabetes Sister   ? Cancer Maternal Uncle   ? Diabetes Maternal Grandmother   ? Cancer Maternal Grandfather   ?     lung cancer  ? Colon cancer Neg Hx   ?  Esophageal cancer Neg Hx   ? Stomach cancer Neg Hx   ? Pancreatic cancer Neg Hx   ? ? ?Social History  ? ?Socioeconomic History  ? Marital status: Widowed  ?  Spouse name: Not on file  ? Number of children: Not on file  ? Years of education: Not on file  ? Highest education level: Not on file  ?Occupational History  ? Not on file  ?Tobacco Use  ? Smoking status: Every Day  ?  Packs/day: 1.00  ?  Years: 41.00  ?  Pack years: 41.00  ?  Types: Cigarettes  ?  Last attempt to quit: 10/07/2018  ?  Years since quitting: 2.8  ? Smokeless tobacco: Never  ?Vaping Use  ? Vaping Use: Never used  ?Substance and Sexual Activity  ? Alcohol use: No  ? Drug use: No  ? Sexual activity: Never  ?  Birth control/protection: None  ?Other Topics Concern  ? Not on file  ?Social History Narrative  ? Lives in [place] with husband.  Unemployed.  Does not  routinely exercise.  ? ?Social Determinants of Health  ? ?Financial Resource Strain: Low Risk   ? Difficulty of Paying Living Expenses: Not very hard  ?Food Insecurity: No Food Insecurity  ? Worried About Charity fundraiser in the Last Year: Never true  ? Ran Out of Food in the Last Year: Never true  ?Transportation Needs: No Transportation Needs  ? Lack of Transportation (Medical): No  ? Lack of Transportation (Non-Medical): No  ?Physical Activity: Unknown  ? Days of Exercise per Week: Not on file  ? Minutes of Exercise per Session: 0 min  ?Stress: No Stress Concern Present  ? Feeling of Stress : Only a little  ?Social Connections: Socially Isolated  ? Frequency of Communication with Friends and Family: More than three times a week  ? Frequency of Social Gatherings with Friends and Family: Once a week  ? Attends Religious Services: Never  ? Active Member of Clubs or Organizations: No  ? Attends Archivist Meetings: Never  ? Marital Status: Widowed  ? ? ?Allergies  ?Allergen Reactions  ? Cymbalta [Duloxetine Hcl] Nausea Only  ?  Nausea, lack of therapeutic effect  ? ? ?Outpatient Medications Prior to Visit  ?Medication Sig Dispense Refill  ? aspirin EC 81 MG tablet Take 81 mg by mouth daily.    ? baclofen (LIORESAL) 10 MG tablet Take one tab PO TID PRN pain/ spasm 30 each 0  ? Blood Glucose Monitoring Suppl (ACCU-CHEK GUIDE ME) w/Device KIT 1 each by Does not apply route 3 (three) times daily. 1 kit 0  ? buPROPion (WELLBUTRIN SR) 150 MG 12 hr tablet Take 1 tablet (150 mg total) by mouth 2 (two) times daily. 180 tablet 1  ? cetirizine (ZYRTEC) 10 MG tablet TAKE 1 TABLET BY MOUTH EVERY DAY 30 tablet 2  ? clotrimazole-betamethasone (LOTRISONE) cream Apply 1 application topically 2 (two) times daily. 30 g 0  ? dicyclomine (BENTYL) 10 MG capsule Take 1 capsule by mouth daily.    ? fenofibrate (TRICOR) 145 MG tablet Take 1 tablet (145 mg total) by mouth daily. 90 tablet 3  ? FLUoxetine (PROZAC) 20 MG capsule  TAKE 1 CAPSULE BY MOUTH EVERY DAY 90 capsule 1  ? gabapentin (NEURONTIN) 300 MG capsule Take 1 capsule (300 mg total) by mouth 2 (two) times daily. 180 capsule 1  ? glucose blood (ACCU-CHEK GUIDE) test strip USE AS DIRECTED twice daily 100  strip 2  ? insulin glargine (LANTUS SOLOSTAR) 100 UNIT/ML Solostar Pen Inject 50 Units into the skin at bedtime. 30 mL 3  ? Insulin Pen Needle (B-D UF III MINI PEN NEEDLES) 31G X 5 MM MISC USE AS DIRECTED (Patient taking differently: 1 each by Other route as directed.) 100 each 3  ? Lancets Misc. (ACCU-CHEK FASTCLIX LANCET) KIT USE AS DIRECTED (Patient taking differently: 1 each by Other route as directed.) 1 kit 0  ? liraglutide (VICTOZA) 18 MG/3ML SOPN INJECT 1.81m SUBCUTANEOUSLY ONCE A DAY 6 mL 3  ? lisinopril (ZESTRIL) 20 MG tablet Take 1 tablet (20 mg total) by mouth daily. 90 tablet 1  ? metoCLOPramide (REGLAN) 10 MG tablet TAKE 1 TABLET BY MOUTH EVERY EIGHT HOURS AS NEEDED FOR NAUSEA OR VOMITING. 40 tablet 0  ? metoprolol tartrate (LOPRESSOR) 25 MG tablet Take 1 tablet (25 mg total) by mouth 2 (two) times daily. 180 tablet 1  ? Multiple Vitamin (MULTIVITAMIN) tablet Take 1 tablet by mouth daily.    ? naproxen (NAPROSYN) 375 MG tablet Take 1 tablet (375 mg total) by mouth 2 (two) times daily with a meal. 20 tablet 0  ? nitroGLYCERIN (NITROSTAT) 0.4 MG SL tablet Place 0.4 mg under the tongue every 5 (five) minutes as needed for chest pain.    ? pantoprazole (PROTONIX) 40 MG tablet TAKE ONE TABLET BY MOUTH ONCE DAILY 90 tablet 0  ? rosuvastatin (CRESTOR) 40 MG tablet Take 1 tablet (40 mg total) by mouth daily. 90 tablet 1  ? sitaGLIPtin-metformin (JANUMET) 50-1000 MG tablet Take 1 tablet by mouth 2 (two) times daily with a meal. 180 tablet 1  ? terbinafine (LAMISIL) 250 MG tablet Please take one a day x 7days, repeat every 4 weeks x 4 months 28 tablet 0  ? traZODone (DESYREL) 50 MG tablet TAKE ONE TABLET BY MOUTH EVERYDAY AT BEDTIME (Patient taking differently: 2 (two) times  daily. TAKE ONE TABLET BY MOUTH EVERYDAY AT BEDTIME) 90 tablet 1  ? vitamin B-12 (CYANOCOBALAMIN) 500 MCG tablet TAKE ONE TABLET BY MOUTH ONCE DAILY 30 tablet 3  ? vitamin C (ASCORBIC ACID) 500 MG tablet Take 500

## 2021-08-04 NOTE — Progress Notes (Signed)
Bad cough ?Leg pain ?Lower back pain. ?

## 2021-08-05 ENCOUNTER — Encounter: Payer: Self-pay | Admitting: Family Medicine

## 2021-08-19 ENCOUNTER — Ambulatory Visit (INDEPENDENT_AMBULATORY_CARE_PROVIDER_SITE_OTHER): Payer: Medicare (Managed Care) | Admitting: Podiatry

## 2021-08-19 ENCOUNTER — Encounter: Payer: Self-pay | Admitting: Podiatry

## 2021-08-19 DIAGNOSIS — M79675 Pain in left toe(s): Secondary | ICD-10-CM | POA: Diagnosis not present

## 2021-08-19 DIAGNOSIS — E1142 Type 2 diabetes mellitus with diabetic polyneuropathy: Secondary | ICD-10-CM

## 2021-08-19 DIAGNOSIS — B351 Tinea unguium: Secondary | ICD-10-CM

## 2021-08-19 DIAGNOSIS — M79674 Pain in right toe(s): Secondary | ICD-10-CM | POA: Diagnosis not present

## 2021-08-20 ENCOUNTER — Ambulatory Visit: Payer: Medicare (Managed Care) | Admitting: Surgery

## 2021-08-23 NOTE — Progress Notes (Signed)
?  Subjective:  ?Patient ID: Sarah Charles, female    DOB: 04/18/1961,  MRN: [id number] ? ?Jenett Mowdy presents to clinic today for at risk foot care with history of diabetic neuropathy and painful elongated mycotic toenails 1-5 bilaterally which are tender when wearing enclosed shoe gear. Pain is relieved with periodic professional debridement. ? ?Patient states blood glucose was 120 mg/dl today Unsure of last A1c, but states it was "good". ? ?New problem(s): None.  ? ?PCP is Charlott Rakes, MD , and last visit was August 04, 2021. ? ?Allergies  ?Allergen Reactions  ? Cymbalta [Duloxetine Hcl] Nausea Only  ?  Nausea, lack of therapeutic effect  ? ? ?Review of Systems: Negative except as noted in the HPI. ? ?Objective: No changes noted in today's physical examination. ? ?Vascular Examination: ?Capillary refill time immediate x 10 digits. Dorsalis pedis present b/l. Posterior tibial pulses present b/l. Digital hair present x 10 digits. Skin temperature gradient WNL b/l.   ? ?Dermatological Examination: ?Pedal integument with normal turgor, texture and tone b/l LE. No open wounds b/l. No interdigital macerations b/l. Toenails 1-5 b/l elongated, thickened, discolored with subungual debris. +Tenderness with dorsal palpation of nailplates. No hyperkeratotic or porokeratotic lesions present.  ? ?Neurological Examination: ?Pt has subjective symptoms of neuropathy. Protective sensation intact 5/5 intact bilaterally with 10g monofilament b/l. Vibratory sensation intact b/l.  ? ?Musculoskeletal Examination: ?Muscle strength 5/5 to all LE muscle groups. Normal LE joint ROM without pain or crepitus.  ?  ? ?  Latest Ref Rng & Units 08/04/2021  ?  3:38 PM 05/05/2021  ?  3:48 PM 11/13/2020  ? 10:38 AM  ?Hemoglobin A1C  ?Hemoglobin-A1c 0.0 - 7.0 % 6.8   7.2   11.3    ? ?Assessment/Plan: ?1. Pain due to onychomycosis of toenails of both feet   ?2. Diabetic polyneuropathy associated with type 2 diabetes mellitus ([place])   ?-Patient was  evaluated and treated. All patient's and/or POA's questions/concerns answered on today's visit. ?-Patient to continue soft, supportive shoe gear daily. ?-Mycotic toenails 1-5 bilaterally were debrided in length and girth with sterile nail nippers and dremel without incident. ?-Patient/POA to call should there be question/concern in the interim.  ? ?Return in about 3 months (around 11/19/2021). ? ?Marzetta Board, DPM  ?

## 2021-08-24 ENCOUNTER — Telehealth: Payer: Self-pay | Admitting: Family Medicine

## 2021-08-24 NOTE — Telephone Encounter (Signed)
Copied from CRM [id number]. Topic: General - Inquiry ?>> Aug 24, 2021 12:21 PM Daphine Deutscher D wrote: ?Reason for CCRN: Pt called asking is it okay for her to take some vitamins that she has at home.  Se has a list of different vitamins and supplements . ? ?CB#  [phone] ?

## 2021-08-25 NOTE — Telephone Encounter (Signed)
Call placed to patient and VM was left informing patient to return phone call. 

## 2021-08-25 NOTE — Telephone Encounter (Signed)
Yes she can take those vitamins but the 'Airborne and Alive', I do not have any experience with and I am not able to provide any recommendations about those. ?

## 2021-08-25 NOTE — Telephone Encounter (Signed)
Pt called to speak with the nurse about if she can take some vitamins she has at home / she wants to make sure its ok to take [place] for brain & eyes, Airborne, Alive for energy, Vitamin B and Tylenol pain relief 500MG  / she wanted to make sure these would be ok taking with the medications she takes and is prescribed from the office / please advise and call pt  ?

## 2021-08-26 NOTE — Telephone Encounter (Signed)
Patient was contacted, DOB verified.  ?Informed message was left for her on yesterday at 1635 by Dr. Baxter Flattery CMA.  ? ?Patient given message per Dr. Alvis Lemmings.  ?Encouraged patient to reach out to Pharmacist regarding her questions about the "Airborne and Alive." Patient verbalized understanding. Denies further questions at this time.  ?

## 2021-08-26 NOTE — Telephone Encounter (Signed)
Patient called in stated that she have not received any calls from the office. Patient was informed that Dr Alvis Lemmings nurse called her yesterday and left a VM but she stated that she have not gotten any messages and would like the nurse to call her back today at her earliest convenience because she need to talk to her. Can be reached at Ph# [phone] ?

## 2021-08-27 ENCOUNTER — Ambulatory Visit: Payer: Self-pay

## 2021-08-27 NOTE — Telephone Encounter (Addendum)
?  Chief Complaint: leg swelling ?Symptoms: leg swelling from knees down to feet on both LE ?Frequency: 1 day ?Pertinent Negatives: Patient denies pain or redness ?Disposition: [] ED /[] Urgent Care (no appt availability in office) / [x] Appointment(In office/virtual)/ []  [facility]/ [] Home Care/ [] Refused Recommended Disposition /[] [facility]/ []  Follow-up with PCP ?Additional Notes: advised pt unable to schedule at [facility] so scheduled for 1st available at [facility] on 08/31/21 @ 0900. Pt was provided with address. Pt advised to elevate legs as much as possible if legs continue to worse to go to UC if needed prior to Monday appt. Pt verbalized understanding.  ? ?Summary: swelling on her leg's ankles and feet.  ? Pt stated she woke up this morning with swelling on her leg's ankles and feet.  ?Pt does not want any fluid medication.  ? ?Pt seeking clinical advice.   ?  ? ?Reason for Disposition ? [1] MODERATE leg swelling (e.g., swelling extends up to knees) AND [2] new-onset or worsening ? ?Answer Assessment - Initial Assessment Questions ?1. ONSET: "When did the swelling start?" (e.g., minutes, hours, days) ?    Last night ?2. LOCATION: "What part of the leg is swollen?"  "Are both legs swollen or just one leg?" ?    Knee down to feet in bilat legs ?3. SEVERITY: "How bad is the swelling?" (e.g., localized; mild, moderate, severe) ? - Localized - small area of swelling localized to one leg ? - MILD pedal edema - swelling limited to foot and ankle, pitting edema < 1/4 inch (6 mm) deep, rest and elevation eliminate most or all swelling ? - MODERATE edema - swelling of lower leg to knee, pitting edema > 1/4 inch (6 mm) deep, rest and elevation only partially reduce swelling ? - SEVERE edema - swelling extends above knee, facial or hand swelling present  ?    moderate ?4. REDNESS: "Does the swelling look red or infected?" ?    No ?5. PAIN: "Is the swelling painful to touch?" If Yes, ask: "How painful is it?"    (Scale 1-10; mild, moderate or severe) ?    No ?8. MEDICAL HISTORY: "Do you have a history of heart failure, kidney disease, liver failure, or cancer?" ?    HF ? ?Protocols used: Leg Swelling and Edema-A-AH ? ?

## 2021-08-28 ENCOUNTER — Encounter (HOSPITAL_COMMUNITY): Payer: Self-pay

## 2021-08-28 ENCOUNTER — Ambulatory Visit (HOSPITAL_COMMUNITY)
Admission: EM | Admit: 2021-08-28 | Discharge: 2021-08-28 | Disposition: A | Discharge status: Home / Self Care | Payer: Medicare (Managed Care) | Attending: Physician Assistant | Admitting: Physician Assistant

## 2021-08-28 DIAGNOSIS — M7989 Other specified soft tissue disorders: Secondary | ICD-10-CM

## 2021-08-28 DIAGNOSIS — Z79899 Other long term (current) drug therapy: Secondary | ICD-10-CM | POA: Diagnosis not present

## 2021-08-28 DIAGNOSIS — R600 Localized edema: Secondary | ICD-10-CM | POA: Insufficient documentation

## 2021-08-28 LAB — POCT URINALYSIS DIPSTICK, ED / UC
Bilirubin Urine: NEGATIVE
Glucose, UA: NEGATIVE mg/dL
Hgb urine dipstick: NEGATIVE
Leukocytes,Ua: NEGATIVE
Nitrite: NEGATIVE
Protein, ur: NEGATIVE mg/dL
Specific Gravity, Urine: 1.02 (ref 1.005–1.030)
Urobilinogen, UA: 0.2 mg/dL (ref 0.0–1.0)
pH: 5 (ref 5.0–8.0)

## 2021-08-28 LAB — CBC WITH DIFFERENTIAL/PLATELET
Abs Immature Granulocytes: 0.03 10*3/uL (ref 0.00–0.07)
Basophils Absolute: 0.1 10*3/uL (ref 0.0–0.1)
Basophils Relative: 1 %
Eosinophils Absolute: 0.1 10*3/uL (ref 0.0–0.5)
Eosinophils Relative: 2 %
HCT: 39.3 % (ref 36.0–46.0)
Hemoglobin: 13.2 g/dL (ref 12.0–15.0)
Immature Granulocytes: 0 %
Lymphocytes Relative: 24 %
Lymphs Abs: 2 10*3/uL (ref 0.7–4.0)
MCH: 30.5 pg (ref 26.0–34.0)
MCHC: 33.6 g/dL (ref 30.0–36.0)
MCV: 90.8 fL (ref 80.0–100.0)
Monocytes Absolute: 0.6 10*3/uL (ref 0.1–1.0)
Monocytes Relative: 7 %
Neutro Abs: 5.4 10*3/uL (ref 1.7–7.7)
Neutrophils Relative %: 66 %
Platelets: 283 10*3/uL (ref 150–400)
RBC: 4.33 MIL/uL (ref 3.87–5.11)
RDW: 12.9 % (ref 11.5–15.5)
WBC: 8.1 10*3/uL (ref 4.0–10.5)
nRBC: 0 % (ref 0.0–0.2)

## 2021-08-28 LAB — COMPREHENSIVE METABOLIC PANEL
ALT: 18 U/L (ref 0–44)
AST: 32 U/L (ref 15–41)
Albumin: 3.5 g/dL (ref 3.5–5.0)
Alkaline Phosphatase: 76 U/L (ref 38–126)
Anion gap: 5 (ref 5–15)
BUN: 27 mg/dL — ABNORMAL HIGH (ref 6–20)
CO2: 30 mmol/L (ref 22–32)
Calcium: 9.4 mg/dL (ref 8.9–10.3)
Chloride: 104 mmol/L (ref 98–111)
Creatinine, Ser: 0.97 mg/dL (ref 0.44–1.00)
GFR, Estimated: >60 mL/min (ref >60)
Glucose, Bld: 197 mg/dL — ABNORMAL HIGH (ref 70–99)
Potassium: 4 mmol/L (ref 3.5–5.1)
Sodium: 139 mmol/L (ref 135–145)
Total Bilirubin: 0.5 mg/dL (ref 0.3–1.2)
Total Protein: 6.8 g/dL (ref 6.5–8.1)

## 2021-08-28 LAB — BRAIN NATRIURETIC PEPTIDE: B Natriuretic Peptide: 16.2 pg/mL (ref 0.0–100.0)

## 2021-08-28 LAB — TSH: TSH: 1.977 u[IU]/mL (ref 0.350–4.500)

## 2021-08-28 MED ORDER — FUROSEMIDE 20 MG PO TABS: 20.0000 mg | ORAL_TABLET | Freq: Every day | ORAL | 0 refills | Status: DC

## 2021-08-28 NOTE — ED Provider Notes (Signed)
?[place] ? ? ? ?CSN: [id number] ?Arrival date & time: 08/28/21  1034 ? ? ?  ? ?History   ?Chief Complaint ?No chief complaint on file. ? ? ?HPI ?Sarah Charles is a 61 y.o. female.  ? ?Patient presents today with a several day history of bilateral leg edema.  She denies any significant pain but does report discomfort related to the severity of swelling.  She denies episodes of similar symptoms in the past.  She denies history of chronic kidney disease, liver disease, heart failure, thyroid condition.  She denies any changes to diet or increase in sodium consumption.  Denies any medication changes.  She does take amlodipine but has been stable on this medication for many years without significant edema.  Denies any shortness of breath, chest pain, lightheadedness, nausea, vomiting.  She has not tried any over-the-counter medications or conservative treatment measures for symptom management.  She has had an echocardiogram in the past that showed normal EF of 55 to 60% on 07/30/2017.  She does not currently take any diuretics. ? ? ?Past Medical History:  ?Diagnosis Date  ? Arthritis   ? Coronary artery calcification seen on CT scan   ? a. 07/2017 noted on CT abd.  ? Depression   ? GERD (gastroesophageal reflux disease)   ? Hyperlipidemia   ? Hypertension   ? Obesity   ? Sleep apnea   ? a. Does not use CPAP "cause i dont think I need it anymore".  ? Tobacco abuse   ? Type II diabetes mellitus ([place])   ? ? ?Patient Active Problem List  ? Diagnosis Date Noted  ? Sleep apnea, unspecified 11/26/2020  ? CAD (coronary artery disease) 11/22/2017  ? Chest pain, precordial 11/04/2017  ? Acute chest pain   ? Type 2 diabetes mellitus with hyperlipidemia ([place]) 10/29/2017  ? Unstable angina ([place]) 10/29/2017  ? Diabetic polyneuropathy associated with type 2 diabetes mellitus ([place]) 05/30/2017  ? Mixed dyslipidemia 05/30/2017  ? Depression 05/31/2016  ? Hypertension 05/31/2016  ? Uncontrolled type 2 diabetes mellitus with  complication 39/07/90  ? Uncontrolled type 2 diabetes mellitus with complication, with long-term current use of insulin 04/20/2015  ? Cellulitis of breast 12/29/2013  ? Diabetes mellitus ([place]) 12/29/2013  ? Cellulitis of female breast 12/29/2013  ? Symptomatic menopausal or female climacteric states 04/08/2008  ? OTHER SPECIFIED DISEASE OF NAIL 04/08/2008  ? URINARY URGENCY 04/08/2008  ? COUGH 04/02/2008  ? HYPERTENSION, BENIGN ESSENTIAL 04/12/2007  ? FATIGUE 04/12/2007  ? OBESITY, NOS 07/21/2006  ? Major depressive disorder, recurrent episode ([place]) 07/21/2006  ? TOBACCO DEPENDENCE 07/21/2006  ? GASTROESOPHAGEAL REFLUX, NO ESOPHAGITIS 07/21/2006  ? INCONTINENCE, STRESS, FEMALE 07/21/2006  ? ? ?Past Surgical History:  ?Procedure Laterality Date  ? CHOLECYSTECTOMY    ? CORONARY STENT INTERVENTION N/A 11/04/2017  ? Procedure: CORONARY STENT INTERVENTION;  Surgeon: Martinique, Peter M, MD;  Location: [facility];  Service: Cardiovascular;  Laterality: N/A;  ? INCISION AND DRAINAGE ABSCESS Left 12/22/2012  ? Procedure: INCISION AND DRAINAGE ABSCESS;  Surgeon: Jamesetta So, MD;  Location: [facility];  Service: General;  Laterality: Left;  ? INTRAVASCULAR PRESSURE WIRE/FFR STUDY N/A 11/04/2017  ? Procedure: INTRAVASCULAR PRESSURE WIRE/FFR STUDY;  Surgeon: Martinique, Peter M, MD;  Location: [facility];  Service: Cardiovascular;  Laterality: N/A;  ? LEFT HEART CATH AND CORONARY ANGIOGRAPHY N/A 11/04/2017  ? Procedure: LEFT HEART CATH AND CORONARY ANGIOGRAPHY;  Surgeon: Martinique, Peter M, MD;  Location: [facility];  Service: Cardiovascular;  Laterality: N/A;  ? TONSILLECTOMY    ? TUBAL LIGATION    ? ? ?OB History   ?No obstetric history on file. ?  ? ? ? ?Home Medications   ? ?Prior to Admission medications   ?Medication Sig Start Date End Date Taking? Authorizing Provider  ?furosemide (LASIX) 20 MG tablet Take 1 tablet (20 mg total) by mouth daily. 08/28/21  Yes Satara Virella K, PA-C  ?amLODipine (NORVASC) 10 MG tablet  Take 1 tablet (10 mg total) by mouth daily. 08/04/21 11/02/21  Charlott Rakes, MD  ?aspirin EC 81 MG tablet Take 81 mg by mouth daily.    [provider]  ?baclofen (LIORESAL) 10 MG tablet Take one tab PO TID PRN pain/ spasm 07/20/21   Crain, Whitney L, PA  ?benzonatate (TESSALON) 100 MG capsule Take 1 capsule (100 mg total) by mouth 2 (two) times daily as needed for cough. 08/04/21   Charlott Rakes, MD  ?Blood Glucose Monitoring Suppl (ACCU-CHEK GUIDE ME) w/Device KIT 1 each by Does not apply route 3 (three) times daily. 11/09/18   [name], PA-C  ?buPROPion (WELLBUTRIN SR) 150 MG 12 hr tablet Take 1 tablet (150 mg total) by mouth 2 (two) times daily. 05/05/21   Charlott Rakes, MD  ?cetirizine (ZYRTEC) 10 MG tablet TAKE 1 TABLET BY MOUTH EVERY DAY 08/02/19   Charlott Rakes, MD  ?clotrimazole-betamethasone (LOTRISONE) cream Apply 1 application topically 2 (two) times daily. 11/28/20   Felipa Furnace, DPM  ?COVID-19 mRNA bivalent vaccine, Pfizer, (PFIZER COVID-19 VAC BIVALENT) injection Inject into the muscle. ?Patient not taking: Reported on 08/04/2021 03/18/21   Carlyle Basques, MD  ?diclofenac Sodium (VOLTAREN) 1 % GEL Apply 4 g topically 4 (four) times daily. ?Patient not taking: Reported on 08/04/2021 05/05/21   Charlott Rakes, MD  ?dicyclomine (BENTYL) 10 MG capsule Take 1 capsule by mouth daily. 05/26/18   [provider]  ?fenofibrate (TRICOR) 145 MG tablet Take 1 tablet (145 mg total) by mouth daily. 05/05/21   Charlott Rakes, MD  ?FLUoxetine (PROZAC) 20 MG capsule TAKE 1 CAPSULE BY MOUTH EVERY DAY 05/05/21   Charlott Rakes, MD  ?gabapentin (NEURONTIN) 300 MG capsule Take 2 capsules (600 mg total) by mouth 2 (two) times daily. 08/04/21   Charlott Rakes, MD  ?glucose blood (ACCU-CHEK GUIDE) test strip USE AS DIRECTED twice daily 04/02/21   Charlott Rakes, MD  ?insulin glargine (LANTUS SOLOSTAR) 100 UNIT/ML Solostar Pen Inject 50 Units into the skin at bedtime. 05/05/21   Charlott Rakes, MD  ?Insulin Pen Needle (B-D UF III MINI PEN NEEDLES) 31G X 5 MM MISC USE AS DIRECTED ?Patient taking differently: 1 each by Other route as directed. 08/14/20   [name], PA-C  ?Lancets Misc. (ACCU-CHEK FASTCLIX LANCET) KIT USE AS DIRECTED ?Patient taking differently: 1 each by Other route as directed. 09/11/20   Charlott Rakes, MD  ?liraglutide (VICTOZA) 18 MG/3ML SOPN INJECT 1.42m SUBCUTANEOUSLY ONCE A DAY 05/05/21   NCharlott Rakes MD  ?lisinopril (ZESTRIL) 20 MG tablet Take 1 tablet (20 mg total) by mouth daily. 05/05/21   NCharlott Rakes MD  ?metoCLOPramide (REGLAN) 10 MG tablet TAKE 1 TABLET BY MOUTH EVERY EIGHT HOURS AS NEEDED FOR NAUSEA OR VOMITING. 08/14/20   MArgentina Donovan PA-C  ?metoprolol tartrate (LOPRESSOR) 25 MG tablet Take 1 tablet (25 mg total) by mouth 2 (two) times daily. 05/05/21   NCharlott Rakes MD  ?metroNIDAZOLE (METROGEL VAGINAL) 0.75 % vaginal gel Place 1 Applicatorful  vaginally at bedtime. ?Patient not taking: Reported on 08/04/2021 12/29/20   Charlott Rakes, MD  ?Multiple Vitamin (MULTIVITAMIN) tablet Take 1 tablet by mouth daily.    [provider]  ?mupirocin ointment (BACTROBAN) 2 % Apply 1 application topically 2 (two) times daily. X 7 days at the first sign of skin infection ?Patient not taking: Reported on 08/04/2021 08/14/20   [name], PA-C  ?naproxen (NAPROSYN) 375 MG tablet Take 1 tablet (375 mg total) by mouth 2 (two) times daily with a meal. 07/20/21   Crain, Whitney L, PA  ?nitroGLYCERIN (NITROSTAT) 0.4 MG SL tablet Place 0.4 mg under the tongue every 5 (five) minutes as needed for chest pain.    [provider]  ?pantoprazole (PROTONIX) 40 MG tablet TAKE ONE TABLET BY MOUTH ONCE DAILY 07/17/21   Charlott Rakes, MD  ?PRALUENT 75 MG/ML SOAJ INJECT 75 MG into THE SKIN every 14 DAYS ?Patient not taking: Reported on 08/04/2021 06/16/21   Loel Dubonnet, NP  ?rosuvastatin (CRESTOR) 40 MG tablet Take 1 tablet (40 mg total) by mouth  daily. 04/15/21   Charlott Rakes, MD  ?sitaGLIPtin-metformin (JANUMET) 50-1000 MG tablet Take 1 tablet by mouth 2 (two) times daily with a meal. 05/05/21   Charlott Rakes, MD  ?terbinafine (LAMISIL) 250 MG tablet Ple

## 2021-08-28 NOTE — Discharge Instructions (Signed)
Your urine was normal.  I will contact you with your lab work if anything is abnormal and we need to change your plan.  Keep your legs elevated and use over-the-counter compression stockings for symptom relief.  Avoid sodium/salt.  Start Lasix daily.  This should be taken in the morning as it will cause you to pee a lot and I do not want you to have to wake up multiple times overnight.  You should follow-up with your primary care as soon as possible for further evaluation.  If anything worsens in the swelling is worse or you develop any shortness of breath, heart racing, chest discomfort, nausea/vomiting you need to go to the emergency room. ?

## 2021-08-28 NOTE — ED Triage Notes (Signed)
Pt presents with c/o ankle, leg and feet swelling X 2 days.  ? ? ?Pt states she has not tried anything to reduce swelling. Pt denies any injuries. Pt denies chest pain, dizziness, headaches and arm pain.  ? ? ?

## 2021-08-31 ENCOUNTER — Telehealth: Payer: Self-pay | Admitting: Family Medicine

## 2021-08-31 ENCOUNTER — Ambulatory Visit (INDEPENDENT_AMBULATORY_CARE_PROVIDER_SITE_OTHER): Payer: Medicare (Managed Care) | Admitting: Physician Assistant

## 2021-08-31 ENCOUNTER — Encounter: Payer: Self-pay | Admitting: Physician Assistant

## 2021-08-31 VITALS — BP 146/81 | HR 63 | Temp 98.9°F | Resp 18 | Ht 62.0 in | Wt 191.0 lb

## 2021-08-31 DIAGNOSIS — F17200 Nicotine dependence, unspecified, uncomplicated: Secondary | ICD-10-CM | POA: Diagnosis not present

## 2021-08-31 DIAGNOSIS — R600 Localized edema: Secondary | ICD-10-CM

## 2021-08-31 DIAGNOSIS — I10 Essential (primary) hypertension: Secondary | ICD-10-CM

## 2021-08-31 DIAGNOSIS — Z23 Encounter for immunization: Secondary | ICD-10-CM | POA: Diagnosis not present

## 2021-08-31 NOTE — Progress Notes (Signed)
Patient has not eaten today and patient has only taken her fluid pill to help address bilateral leg swelling. ?Patient reports chronic back pain at a 5. ?Patient reports swelling being better than over the weekend. ? ?

## 2021-08-31 NOTE — Progress Notes (Signed)
? ?Established Patient Office Visit ? ?Subjective:  ?Patient ID: Sarah AmmonsLaura Normington, female    DOB: 02/16/1961  Age: 61 y.o. MRN: [id number] ? ?CC:  ?Chief Complaint  ?Patient presents with  ? Leg Swelling  ? ? ?HPI ?Sarah AmmonsLaura Crall states that she was seen at urgent care on August 28, 2021.  Note from that visit ? ?Patient presents today with a several day history of bilateral leg edema.  She denies any significant pain but does report discomfort related to the severity of swelling.  She denies episodes of similar symptoms in the past.  She denies history of chronic kidney disease, liver disease, heart failure, thyroid condition.  She denies any changes to diet or increase in sodium consumption.  Denies any medication changes.  She does take amlodipine but has been stable on this medication for many years without significant edema.  Denies any shortness of breath, chest pain, lightheadedness, nausea, vomiting.  She has not tried any over-the-counter medications or conservative treatment measures for symptom management.  She has had an echocardiogram in the past that showed normal EF of 55 to 60% on 07/30/2017.  She does not currently take any diuretics. ?Initial Impression / Assessment and Plan / UC Course  ?I have reviewed the triage vital signs and the nursing notes. ?  ?Pertinent labs & imaging results that were available during my care of the patient were reviewed by me and considered in my medical decision making (see chart for details). ?  ?  ?Unclear etiology of symptoms.  Recommended conservative treatment measures including elevation, compression, sodium restriction.  Low suspicion for DVT given negative Homans' sign, no significant risk factors, symmetrical leg size.  Discussed that if she has worsening swelling in 1 leg, significant pain she should be reevaluated at which point we would consider DVT ultrasound.  UA was obtained that showed no significant proteinuria.  BNP, CBC, TSH, CMP obtained today-results pending.   We will contact patient with lab results if this changes our treatment plan.  Recommended she follow-up with her primary care provider.  She was started on low-dose Lasix (20 mg for 5 days).  Last metabolic panel obtained 05/05/2021 showed normal kidney function with creatinine of 0.51.  We will contact patient if we need to adjust diuretic based on today's kidney function.  She was provided work excuse note.  Discussed that if she has any worsening symptoms including increased pain, increased swelling, shortness of breath, heart racing, nausea/vomiting she needs to be seen immediately.  Strict return precautions given to which she expressed understanding. ? ?States today that she has taken the lasix every day since Satruday with relief, states that they look better, still having a little bit of pain.  States that she does not check her blood pressure at home, states she does not have a blood pressure cuff. ? ?States that her amlodipine was increased approximately 1 month ago, states that the swelling started 5 days ago. ? ?Also endorses that she eats a lot of junk food and "lots of chips" ? ?States that she does continue smoking and does not want to quit.  States is the only thing that helps her with her stress, and states that she is still having difficulty with the loss of her husband 2 years ago. ? ? ? ? ?Past Medical History:  ?Diagnosis Date  ? Arthritis   ? Coronary artery calcification seen on CT scan   ? a. 07/2017 noted on CT abd.  ? Depression   ?  GERD (gastroesophageal reflux disease)   ? Hyperlipidemia   ? Hypertension   ? Obesity   ? Sleep apnea   ? a. Does not use CPAP "cause i dont think I need it anymore".  ? Tobacco abuse   ? Type II diabetes mellitus (HCC)   ? ? ?Past Surgical History:  ?Procedure Laterality Date  ? CHOLECYSTECTOMY    ? CORONARY STENT INTERVENTION N/A 11/04/2017  ? Procedure: CORONARY STENT INTERVENTION;  Surgeon: Swaziland, Peter M, MD;  Location: [facility] INVASIVE CV LAB;  Service:  Cardiovascular;  Laterality: N/A;  ? INCISION AND DRAINAGE ABSCESS Left 12/22/2012  ? Procedure: INCISION AND DRAINAGE ABSCESS;  Surgeon: Dalia Heading, MD;  Location: [facility];  Service: General;  Laterality: Left;  ? INTRAVASCULAR PRESSURE WIRE/FFR STUDY N/A 11/04/2017  ? Procedure: INTRAVASCULAR PRESSURE WIRE/FFR STUDY;  Surgeon: Swaziland, Peter M, MD;  Location: [facility] INVASIVE CV LAB;  Service: Cardiovascular;  Laterality: N/A;  ? LEFT HEART CATH AND CORONARY ANGIOGRAPHY N/A 11/04/2017  ? Procedure: LEFT HEART CATH AND CORONARY ANGIOGRAPHY;  Surgeon: Swaziland, Peter M, MD;  Location: [facility] INVASIVE CV LAB;  Service: Cardiovascular;  Laterality: N/A;  ? TONSILLECTOMY    ? TUBAL LIGATION    ? ? ?Family History  ?Problem Relation Age of Onset  ? Hypertension Mother   ? CVA Mother   ? COPD Mother   ? Heart disease Mother   ? Kidney Stones Mother   ? Heart attack Mother   ?     MI in late 50's  ? Breast cancer Mother   ? CAD Father   ? Hypercholesterolemia Father   ? Heart attack Father   ?     Died @ 9 of MI  ? Diabetes Sister   ? Cancer Maternal Uncle   ? Diabetes Maternal Grandmother   ? Cancer Maternal Grandfather   ?     lung cancer  ? Colon cancer Neg Hx   ? Esophageal cancer Neg Hx   ? Stomach cancer Neg Hx   ? Pancreatic cancer Neg Hx   ? ? ?Social History  ? ?Socioeconomic History  ? Marital status: Widowed  ?  Spouse name: Not on file  ? Number of children: Not on file  ? Years of education: Not on file  ? Highest education level: Not on file  ?Occupational History  ? Not on file  ?Tobacco Use  ? Smoking status: Every Day  ?  Packs/day: 1.00  ?  Years: 41.00  ?  Pack years: 41.00  ?  Types: Cigarettes  ?  Last attempt to quit: 10/07/2018  ?  Years since quitting: 2.9  ? Smokeless tobacco: Never  ?Vaping Use  ? Vaping Use: Never used  ?Substance and Sexual Activity  ? Alcohol use: No  ? Drug use: No  ? Sexual activity: Never  ?  Birth control/protection: None  ?Other Topics Concern  ? Not on file  ?Social History Narrative   ? Lives in [place] with husband.  Unemployed.  Does not routinely exercise.  ? ?Social Determinants of Health  ? ?Financial Resource Strain: Low Risk   ? Difficulty of Paying Living Expenses: Not very hard  ?Food Insecurity: No Food Insecurity  ? Worried About Programme researcher, broadcasting/film/video in the Last Year: Never true  ? Ran Out of Food in the Last Year: Never true  ?Transportation Needs: No Transportation Needs  ? Lack of Transportation (Medical): No  ? Lack of Transportation (Non-Medical): No  ?Physical  Activity: Unknown  ? Days of Exercise per Week: Not on file  ? Minutes of Exercise per Session: 0 min  ?Stress: No Stress Concern Present  ? Feeling of Stress : Only a little  ?Social Connections: Socially Isolated  ? Frequency of Communication with Friends and Family: More than three times a week  ? Frequency of Social Gatherings with Friends and Family: Once a week  ? Attends Religious Services: Never  ? Active Member of Clubs or Organizations: No  ? Attends Banker Meetings: Never  ? Marital Status: Widowed  ?Intimate Partner Violence: Not At Risk  ? Fear of Current or Ex-Partner: No  ? Emotionally Abused: No  ? Physically Abused: No  ? Sexually Abused: No  ? ? ?Outpatient Medications Prior to Visit  ?Medication Sig Dispense Refill  ? amLODipine (NORVASC) 10 MG tablet Take 1 tablet (10 mg total) by mouth daily. 90 tablet 1  ? aspirin EC 81 MG tablet Take 81 mg by mouth daily.    ? baclofen (LIORESAL) 10 MG tablet Take one tab PO TID PRN pain/ spasm 30 each 0  ? benzonatate (TESSALON) 100 MG capsule Take 1 capsule (100 mg total) by mouth 2 (two) times daily as needed for cough. 20 capsule 0  ? buPROPion (WELLBUTRIN SR) 150 MG 12 hr tablet Take 1 tablet (150 mg total) by mouth 2 (two) times daily. 180 tablet 1  ? cetirizine (ZYRTEC) 10 MG tablet TAKE 1 TABLET BY MOUTH EVERY DAY 30 tablet 2  ? clotrimazole-betamethasone (LOTRISONE) cream Apply 1 application topically 2 (two) times daily. 30 g 0  ? diclofenac  Sodium (VOLTAREN) 1 % GEL Apply 4 g topically 4 (four) times daily. 100 g 1  ? dicyclomine (BENTYL) 10 MG capsule Take 1 capsule by mouth daily.    ? fenofibrate (TRICOR) 145 MG tablet Take 1 tablet (145 mg

## 2021-08-31 NOTE — Telephone Encounter (Signed)
Copied from CRM [id number]. Topic: General - Other >> Aug 28, 2021  1:01 PM Traci Sermon wrote: Reason for CRM: [facility] [phone] called in wanting to speak with a nurse about pt test stripes and requested a call back, due to office being closed, please advise.

## 2021-08-31 NOTE — Patient Instructions (Signed)
I encourage you to continue using the Lasix as prescribed by urgent care. ? ?It is very important that you check your blood pressure at home on a daily basis, keep a written log and have available for all office visits.  If your blood pressure continues to be elevated, please feel free to return to the mobile team or follow-up with Dr. Alvis Lemmings. ? ?Roney Jaffe, PA-C ?Physician Assistant ?[facility] ?[email or web address] ? ? ?How to Take Your Blood Pressure ?Blood pressure is a measurement of how strongly your blood is pressing against the walls of your arteries. Arteries are blood vessels that carry blood from your heart throughout your body. Your health care provider takes your blood pressure at each office visit. You can also take your own blood pressure at home with a blood pressure monitor. ?You may need to take your own blood pressure to: ?Confirm a diagnosis of high blood pressure (hypertension). ?Monitor your blood pressure over time. ?Make sure your blood pressure medicine is working. ?Supplies needed: ?Blood pressure monitor. ?Dining room chair to sit in. ?Table or desk. ?Small notebook and pencil or pen. ?How to prepare ?To get the most accurate reading, avoid the following for 30 minutes before you check your blood pressure: ?Drinking caffeine. ?Drinking alcohol. ?Eating. ?Smoking. ?Exercising. ?Five minutes before you check your blood pressure: ?Use the bathroom and urinate so that you have an empty bladder. ?Sit quietly in a dining room chair. Do not sit in a soft couch or an armchair. Do not talk. ?How to take your blood pressure ?To check your blood pressure, follow the instructions in the manual that came with your blood pressure monitor. If you have a digital blood pressure monitor, the instructions may be as follows: ?Sit up straight in a chair. ?Place your feet on the floor. Do not cross your ankles or legs. ?Rest your left arm at the level  of your heart on a table or desk or on the arm of a chair. ?Pull up your shirt sleeve. ?Wrap the blood pressure cuff around the upper part of your left arm, 1 inch (2.5 cm) above your elbow. It is best to wrap the cuff around bare skin. ?Fit the cuff snugly around your arm. You should be able to place only one finger between the cuff and your arm. ?Position the cord so that it rests in the bend of your elbow. ?Press the power button. ?Sit quietly while the cuff inflates and deflates. ?Read the digital reading on the monitor screen and write the numbers down (record them) in a notebook. ?Wait 2-3 minutes, then repeat the steps, starting at step 1. ?What does my blood pressure reading mean? ?A blood pressure reading consists of a higher number over a lower number. Ideally, your blood pressure should be below 120/80. The first ("top") number is called the systolic pressure. It is a measure of the pressure in your arteries as your heart beats. The second ("bottom") number is called the diastolic pressure. It is a measure of the pressure in your arteries as the heart relaxes. ?Blood pressure is classified into five stages. The following are the stages for adults who do not have a short-term serious illness or a chronic condition. Systolic pressure and diastolic pressure are measured in a unit called mm Hg (millimeters of mercury).  ?Normal ?Systolic pressure: below 120. ?Diastolic pressure: below 80. ?Elevated ?Systolic pressure: 120-129. ?Diastolic pressure: below 80. ?Hypertension stage 1 ?Systolic pressure: 130-139. ?Diastolic pressure: 80-89. ?Hypertension stage 2 ?Systolic  pressure: 140 or above. ?Diastolic pressure: 90 or above. ?You can have elevated blood pressure or hypertension even if only the systolic or only the diastolic number in your reading is higher than normal. ?Follow these instructions at home: ?Medicines ?Take over-the-counter and prescription medicines only as told by your health care  provider. ?Tell your health care provider if you are having any side effects from blood pressure medicine. ?General instructions ?Check your blood pressure as often as recommended by your health care provider. ?Check your blood pressure at the same time every day. ?Take your monitor to the next appointment with your health care provider to make sure that: ?You are using it correctly. ?It provides accurate readings. ?Understand what your goal blood pressure numbers are. ?Keep all follow-up visits as told by your health care provider. This is important. ?General tips ?Your health care provider can suggest a reliable monitor that will meet your needs. There are several types of home blood pressure monitors. ?Choose a monitor that has an arm cuff. Do not choose a monitor that measures your blood pressure from your wrist or finger. ?Choose a cuff that wraps snugly around your upper arm. You should be able to fit only one finger between your arm and the cuff. ?You can buy a blood pressure monitor at most drugstores or online. ?Where to find more information ?American Heart Association: www.heart.org ?Contact a health care provider if: ?Your blood pressure is consistently high. ?Your blood pressure is suddenly low. ?Get help right away if: ?Your systolic blood pressure is higher than 180. ?Your diastolic blood pressure is higher than 120. ?Summary ?Blood pressure is a measurement of how strongly your blood is pressing against the walls of your arteries. ?A blood pressure reading consists of a higher number over a lower number. Ideally, your blood pressure should be below 120/80. ?Check your blood pressure at the same time every day. ?Avoid caffeine, alcohol, smoking, and exercise for 30 minutes prior to checking your blood pressure. These agents can affect the accuracy of the blood pressure reading. ?This information is not intended to replace advice given to you by your health care provider. Make sure you discuss any  questions you have with your health care provider. ?Document Revised: 03/19/2020 Document Reviewed: 05/04/2019 ?Elsevier Patient Education ? 2022 Elsevier Inc. ? ?

## 2021-09-01 ENCOUNTER — Other Ambulatory Visit: Payer: Self-pay

## 2021-09-01 DIAGNOSIS — E1165 Type 2 diabetes mellitus with hyperglycemia: Secondary | ICD-10-CM

## 2021-09-01 MED ORDER — ACCU-CHEK GUIDE VI STRP: ORAL_STRIP | 2 refills | Status: DC

## 2021-09-01 NOTE — Telephone Encounter (Signed)
Refills for strips has been sent. ?

## 2021-09-10 ENCOUNTER — Ambulatory Visit (HOSPITAL_BASED_OUTPATIENT_CLINIC_OR_DEPARTMENT_OTHER): Payer: Medicare (Managed Care) | Admitting: Family

## 2021-09-14 ENCOUNTER — Ambulatory Visit: Payer: Self-pay

## 2021-09-14 NOTE — Telephone Encounter (Signed)
Chief Complaint: Leg swelling ?Symptoms: Indentations, no swelling at present, redness when swelling ?Frequency: Noticed it 3 days ago ?Pertinent Negatives: Patient denies other symptoms, no SOB, no CP ?Disposition: [] ED /[] Urgent Care (no appt availability in office) / [x] Appointment(In office/virtual)/ []  [facility]/ [] Home Care/ [] Refused Recommended Disposition /[] [facility]/ []  Follow-up with PCP ?Additional Notes: N/A ? ? ? ?Reason for Disposition ? [1] MILD swelling of both ankles (i.e., pedal edema) AND [2] varicose veins ? ?Answer Assessment - Initial Assessment Questions ?1. ONSET: "When did the swelling start?" (e.g., minutes, hours, days) ?    3 days ago ?2. LOCATION: "What part of the leg is swollen?"  "Are both legs swollen or just one leg?" ?    Right lower leg near the ankle ?3. SEVERITY: "How bad is the swelling?" (e.g., localized; mild, moderate, severe) ? - Localized - small area of swelling localized to one leg ? - MILD pedal edema - swelling limited to foot and ankle, pitting edema < 1/4 inch (6 mm) deep, rest and elevation eliminate most or all swelling ? - MODERATE edema - swelling of lower leg to knee, pitting edema > 1/4 inch (6 mm) deep, rest and elevation only partially reduce swelling ? - SEVERE edema - swelling extends above knee, facial or hand swelling present  ?    Mild, indents and hard to walk ?4. REDNESS: "Does the swelling look red or infected?" ?    No redness now, but red when it swells ?5. PAIN: "Is the swelling painful to touch?" If Yes, ask: "How painful is it?"   (Scale 1-10; mild, moderate or severe) ?    No ?6. FEVER: "Do you have a fever?" If Yes, ask: "What is it, how was it measured, and when did it start?"  ?    No ?7. CAUSE: "What do you think is causing the leg swelling?" ?    Holding fluid ?8. MEDICAL HISTORY: "Do you have a history of heart failure, kidney disease, liver failure, or cancer?" ?    Yes ?9. RECURRENT SYMPTOM: "Have you had  leg swelling before?" If Yes, ask: "When was the last time?" "What happened that time?" ?    Yes, UC few weeks ago given fluid pills ?10. OTHER SYMPTOMS: "Do you have any other symptoms?" (e.g., chest pain, difficulty breathing) ?      No ?11. PREGNANCY: "Is there any chance you are pregnant?" "When was your last menstrual period?" ?      No ? ?Protocols used: Leg Swelling and Edema-A-AH ? ?

## 2021-09-14 NOTE — Telephone Encounter (Signed)
Summary: Leg indention (not painful)  ? Pt says for about a month she says her muscles look like they are coming apart. She says she sees deep indentions in her right leg whenever she spends time on her feet. No pain, approved per triage to send clinical call  ? ?Best contact: [phone]   ?  ?Called pt - LMOM to return call. ?

## 2021-09-15 ENCOUNTER — Other Ambulatory Visit: Payer: Self-pay | Admitting: Physician Assistant

## 2021-09-15 DIAGNOSIS — E118 Type 2 diabetes mellitus with unspecified complications: Secondary | ICD-10-CM

## 2021-09-16 ENCOUNTER — Ambulatory Visit: Payer: Medicare (Managed Care) | Admitting: Physician Assistant

## 2021-09-16 NOTE — Progress Notes (Deleted)
Patient ID: Sarah Charles, female   DOB: [birth year], 61 y.o.   MRN: [id number]  Seen at UC on 08/28/2021 for BLE edema.  From A/P: Unclear etiology of symptoms.  Recommended conservative treatment measures including elevation, compression, sodium restriction.  Low suspicion for DVT given negative Homans' sign, no significant risk factors, symmetrical leg size.  Discussed that if she has worsening swelling in 1 leg, significant pain she should be reevaluated at which point we would consider DVT ultrasound.  UA was obtained that showed no significant proteinuria.  BNP, CBC, TSH, CMP obtained today-results pending.  We will contact patient with lab results if this changes our treatment plan.  Recommended she follow-up with her primary care provider.  She was started on low-dose Lasix (20 mg for 5 days).  Last metabolic panel obtained 05/05/2021 showed normal kidney function with creatinine of 0.51.  We will contact patient if we need to adjust diuretic based on today's kidney function.  She was provided work excuse note.  Discussed that if she has any worsening symptoms including increased pain, increased swelling, shortness of breath, heart racing, nausea/vomiting she needs to be seen immediately.  Strict return precautions given to which she expressed understanding.

## 2021-09-18 ENCOUNTER — Telehealth: Payer: Self-pay | Admitting: Family Medicine

## 2021-09-18 NOTE — Telephone Encounter (Signed)
Medication Refill - Medication: Blood Sugar Machine ? ?Has the patient contacted their pharmacy? Yes.   ?Pt called in stating she is needing a new machine, she states the one she has is expired, and that the pharmacy will no long give the test strips ? ?Preferred Pharmacy (with phone number or street name):  ?[facility] - [place], [place] - [address]  ?[address], [place] [address]  ?Phone:  [phone]  Fax:  [phone]  ? ?Has the patient been seen for an appointment in the last year OR does the patient have an upcoming appointment? Yes.   ? ?Agent: Please be advised that RX refills may take up to 3 business days. We ask that you follow-up with your pharmacy. ?

## 2021-09-21 ENCOUNTER — Other Ambulatory Visit: Payer: Self-pay

## 2021-09-21 MED ORDER — ACCU-CHEK SOFTCLIX LANCETS MISC
12 refills | Status: DC
Start: 2021-09-21 — End: 2022-02-09

## 2021-09-21 MED ORDER — ACCU-CHEK GUIDE VI STRP: ORAL_STRIP | 12 refills | Status: DC

## 2021-09-21 MED ORDER — ACCU-CHEK GUIDE W/DEVICE KIT
PACK | 0 refills | Status: DC
Start: 2021-09-21 — End: 2022-02-09

## 2021-09-21 NOTE — Telephone Encounter (Signed)
Supplies has been sent to pharmacy. ?

## 2021-09-22 ENCOUNTER — Ambulatory Visit: Payer: Medicare (Managed Care) | Admitting: Gastroenterology

## 2021-09-23 ENCOUNTER — Ambulatory Visit: Payer: Medicare (Managed Care) | Admitting: Cardiology

## 2021-09-24 ENCOUNTER — Ambulatory Visit: Payer: Medicare (Managed Care) | Admitting: Physician Assistant

## 2021-09-27 NOTE — Progress Notes (Deleted)
Cardiology Office Note:    Date:  09/27/2021   ID:  Sarah Charles, DOB Jun 26, 1960, MRN [id number]  PCP:  [name], MD   [facility] Providers Cardiologist:  Meriam Sprague, MD {   Referring MD: [name], MD    History of Present Illness:    Sarah Charles is a 61 y.o. female with a hx of CAD s/p LAD PCI, DMII, HLD, obesity, tobacco use, depression and GERD who was previously followed by Dr. Delton See who now presents to clinic for follow-up.   Per review of the record, she was evaluated in the ED 10/2017 for chest pressure.  She underwent cardiac CTA showing significant mid LAD stenosis by FFR.  She was discharged and later came for outpatient cardiac catheterization 10/2017 revealing 75% stenosis in proximal LAD treated with DES.  Was last seen in clinic on 02/2021 where she was doing well. No CV symptoms.   Past Medical History:  Diagnosis Date   Arthritis    Coronary artery calcification seen on CT scan    a. 07/2017 noted on CT abd.   Depression    GERD (gastroesophageal reflux disease)    Hyperlipidemia    Hypertension    Obesity    Sleep apnea    a. Does not use CPAP "cause i dont think I need it anymore".   Tobacco abuse    Type II diabetes mellitus (HCC)     Past Surgical History:  Procedure Laterality Date   CHOLECYSTECTOMY     CORONARY STENT INTERVENTION N/A 11/04/2017   Procedure: CORONARY STENT INTERVENTION;  Surgeon: Swaziland, Peter M, MD;  Location: [facility] INVASIVE CV LAB;  Service: Cardiovascular;  Laterality: N/A;   INCISION AND DRAINAGE ABSCESS Left 12/22/2012   Procedure: INCISION AND DRAINAGE ABSCESS;  Surgeon: Dalia Heading, MD;  Location: [facility];  Service: General;  Laterality: Left;   INTRAVASCULAR PRESSURE WIRE/FFR STUDY N/A 11/04/2017   Procedure: INTRAVASCULAR PRESSURE WIRE/FFR STUDY;  Surgeon: Swaziland, Peter M, MD;  Location: [facility] INVASIVE CV LAB;  Service: Cardiovascular;  Laterality: N/A;   LEFT HEART CATH AND CORONARY ANGIOGRAPHY N/A 11/04/2017    Procedure: LEFT HEART CATH AND CORONARY ANGIOGRAPHY;  Surgeon: Swaziland, Peter M, MD;  Location: [facility] INVASIVE CV LAB;  Service: Cardiovascular;  Laterality: N/A;   TONSILLECTOMY     TUBAL LIGATION      Current Medications: No outpatient medications have been marked as taking for the 09/29/21 encounter (Appointment) with Meriam Sprague, MD.     Allergies:   Cymbalta [duloxetine hcl]   Social History   Socioeconomic History   Marital status: Widowed    Spouse name: Not on file   Number of children: Not on file   Years of education: Not on file   Highest education level: Not on file  Occupational History   Not on file  Tobacco Use   Smoking status: Every Day    Packs/day: 1.00    Years: 41.00    Pack years: 41.00    Types: Cigarettes    Last attempt to quit: 10/07/2018    Years since quitting: 2.9   Smokeless tobacco: Never  Vaping Use   Vaping Use: Never used  Substance and Sexual Activity   Alcohol use: No   Drug use: No   Sexual activity: Never    Birth control/protection: None  Other Topics Concern   Not on file  Social History Narrative   Lives in [place] with husband.  Unemployed.  Does not routinely exercise.  Social Determinants of Health   Financial Resource Strain: Low Risk    Difficulty of Paying Living Expenses: Not very hard  Food Insecurity: No Food Insecurity   Worried About Programme researcher, broadcasting/film/videounning Out of Food in the Last Year: Never true   Ran Out of Food in the Last Year: Never true  Transportation Needs: No Transportation Needs   Lack of Transportation (Medical): No   Lack of Transportation (Non-Medical): No  Physical Activity: Unknown   Days of Exercise per Week: Not on file   Minutes of Exercise per Session: 0 min  Stress: No Stress Concern Present   Feeling of Stress : Only a little  Social Connections: Socially Isolated   Frequency of Communication with Friends and Family: More than three times a week   Frequency of Social Gatherings with Friends  and Family: Once a week   Attends Religious Services: Never   Database administratorActive Member of Clubs or Organizations: No   Attends BankerClub or Organization Meetings: Never   Marital Status: Widowed     Family History: The patient's ***family history includes Breast cancer in her mother; CAD in her father; COPD in her mother; CVA in her mother; Cancer in her maternal grandfather and maternal uncle; Diabetes in her maternal grandmother and sister; Heart attack in her father and mother; Heart disease in her mother; Hypercholesterolemia in her father; Hypertension in her mother; Kidney Stones in her mother. There is no history of Colon cancer, Esophageal cancer, Stomach cancer, or Pancreatic cancer.  ROS:   Please see the history of present illness.    *** All other systems reviewed and are negative.  EKGs/Labs/Other Studies Reviewed:    The following studies were reviewed today: Echo 10/2017 Study Conclusions   - Left ventricle: The cavity size was normal. Systolic function was    normal. The estimated ejection fraction was in the range of 55%    to 60%.   -------------------------------------------------------------------  Study data:  No prior study was available for comparison.  Study  status:  Routine.  Procedure:  The patient reported no pain pre or  post test. Transthoracic echocardiography. Image quality was fair.  Study completion:  There were no complications.  Transthoracic echocardiography.  M-mode, complete 2D, spectral  Doppler, and color Doppler.  Birthdate:  Patient birthdate:  June 04, 1960.  Age:  Patient is 61 yr old.  Sex:  Gender: female.  BMI: 31.7 kg/m^2.  Blood pressure:     151/91  Patient status:  Inpatient.  Study date:  Study date: 10/30/2017. Study time: 11:39  AM.  Location:  Echo laboratory.   -------------------------------------------------------------------   -------------------------------------------------------------------  Left ventricle:  The cavity size was normal.  Systolic function was  normal. The estimated ejection fraction was in the range of 55% to  60%.   -------------------------------------------------------------------  Aortic valve:   Structurally normal valve.   Cusp separation was  normal.  Doppler:  Transvalvular velocity was within the normal  range. There was no stenosis. There was no regurgitation.   -------------------------------------------------------------------  Mitral valve:   Structurally normal valve.   Leaflet separation was  normal.  Doppler:  Transvalvular velocity was within the normal  range. There was no evidence for stenosis. There was no  regurgitation.   -------------------------------------------------------------------  Left atrium:  The atrium was normal in size.   -------------------------------------------------------------------  Right ventricle:  The cavity size was normal. Wall thickness was  normal. Systolic function was normal.   -------------------------------------------------------------------  Tricuspid valve:   Structurally normal valve.   Leaflet  separation  was normal.  Doppler:  Transvalvular velocity was within the normal  range. There was trivial regurgitation.   -------------------------------------------------------------------  Right atrium:  The atrium was normal in size.   -------------------------------------------------------------------  Pericardium: There was no pericardial effusion.  LHC 10/2017 Prox LAD lesion is 70% stenosed. Prox Cx to Mid Cx lesion is 30% stenosed. Prox RCA to Mid RCA lesion is 20% stenosed. Post intervention, there is a 0% residual stenosis. A drug-eluting stent was successfully placed using a STENT SIERRA 2.50 X 15 MM. LV end diastolic pressure is normal.   1. Single vessel obstructive CAD.    - 70% mid LAD immediately after the first diagonal. FFR 0.78 2. Normal LVEDP 3. Successful PCI of the mid LAD with DES   Plan: DAPT for at least 6 months.  She is a candidate for same day discharge. Will hold metformin for 48 hours. Will need to switch Prilosec to Protonix.     EKG:  EKG is *** ordered today.  The ekg ordered today demonstrates ***  Recent Labs: 08/28/2021: ALT 18; B Natriuretic Peptide 16.2; BUN 27; Creatinine, Ser 0.97; Hemoglobin 13.2; Platelets 283; Potassium 4.0; Sodium 139; TSH 1.977  Recent Lipid Panel    Component Value Date/Time   CHOL 208 (H) 08/14/2020 1124   TRIG 187 (H) 08/14/2020 1124   HDL 53 08/14/2020 1124   CHOLHDL 3.9 08/14/2020 1124   CHOLHDL 9.7 (H) 07/23/2016 1638   VLDL NOT CALC 07/23/2016 1638   LDLCALC 122 (H) 08/14/2020 1124     Risk Assessment/Calculations:   {Does this patient have ATRIAL FIBRILLATION?:[phone]}       Physical Exam:    VS:  There were no vitals taken for this visit.    Wt Readings from Last 3 Encounters:  08/31/21 191 lb (86.6 kg)  08/04/21 191 lb (86.6 kg)  05/05/21 179 lb 6.4 oz (81.4 kg)     GEN: *** Well nourished, well developed in no acute distress HEENT: Normal NECK: No JVD; No carotid bruits LYMPHATICS: No lymphadenopathy CARDIAC: ***RRR, no murmurs, rubs, gallops RESPIRATORY:  Clear to auscultation without rales, wheezing or rhonchi  ABDOMEN: Soft, non-tender, non-distended MUSCULOSKELETAL:  No edema; No deformity  SKIN: Warm and dry NEUROLOGIC:  Alert and oriented x 3 PSYCHIATRIC:  Normal affect   ASSESSMENT:    No diagnosis found. PLAN:    In order of problems listed above:  #CAD s/p LAD PCI: Cardiac CT in 2019 demonstrate significant LAD stenosis with positive FFR. She underwent cath on 10/2017 which demonstrated 70% p-LAD stenosis s/p PCI. -Continue crestor 40mg  daily -Continue ASA 81mg  daily -Continue metop 25mg  BID -Continue lisinopril 20mg  daily -Continue praulent and fenofibrate  #HTN: -Continue amlodipine 10mg  daily -Continue metop 25mg  BID -Continue lisinopril 20mg  daily  #HLD: -Continue crestor 40mg  daily, praulent 75mg   BID and fenofibrate 145mg  daily  #DMII: -Continue insulin  #Tobacco Use:      {Are you ordering a CV Procedure (e.g. stress test, cath, DCCV, TEE, etc)?   Press F2        :    Medication Adjustments/Labs and Tests Ordered: Current medicines are reviewed at length with the patient today.  Concerns regarding medicines are outlined above.  No orders of the defined types were placed in this encounter.  No orders of the defined types were placed in this encounter.   There are no Patient Instructions on file for this visit.   Signed, , MD  09/27/2021 6:53 AM  [facility]

## 2021-09-29 ENCOUNTER — Encounter (HOSPITAL_COMMUNITY): Payer: Self-pay

## 2021-09-29 ENCOUNTER — Ambulatory Visit: Payer: Medicare (Managed Care) | Admitting: Cardiology

## 2021-09-29 ENCOUNTER — Ambulatory Visit (HOSPITAL_COMMUNITY)
Admission: EM | Admit: 2021-09-29 | Discharge: 2021-09-29 | Disposition: A | Discharge status: Home / Self Care | Payer: Medicare (Managed Care) | Attending: Family Medicine | Admitting: Family Medicine

## 2021-09-29 DIAGNOSIS — R600 Localized edema: Secondary | ICD-10-CM | POA: Diagnosis not present

## 2021-09-29 MED ORDER — FUROSEMIDE 20 MG PO TABS
20.0000 mg | ORAL_TABLET | Freq: Every day | ORAL | 0 refills | Status: DC | PRN
Start: 2021-09-29 — End: 2021-10-08

## 2021-09-29 MED ORDER — POTASSIUM CHLORIDE ER 10 MEQ PO TBCR: 10.0000 meq | EXTENDED_RELEASE_TABLET | Freq: Every day | ORAL | 0 refills | Status: DC | PRN

## 2021-09-29 NOTE — ED Triage Notes (Signed)
Pt presents with bilateral leg swelling with both of them weeping fluids since last night. ?

## 2021-09-29 NOTE — ED Provider Notes (Addendum)
[facility]    CSN: [id number] Arrival date & time: 09/29/21  1750      History   Chief Complaint Chief Complaint  Patient presents with   Leg Swelling    HPI Sarah Charles is a 61 y.o. female.   HPI Here for swelling that is returned after she finished her 5 days of furosemide last month.  Her legs are now weeping some.  No fever or chills.  She has seen her primary care clinic and will follow-up with community health and wellness on May 18  Allows that she does love salty foods, including chips.  Renal function was normal on labs done 4/7.  Past Medical History:  Diagnosis Date   Arthritis    Coronary artery calcification seen on CT scan    a. 07/2017 noted on CT abd.   Depression    GERD (gastroesophageal reflux disease)    Hyperlipidemia    Hypertension    Obesity    Sleep apnea    a. Does not use CPAP "cause i dont think I need it anymore".   Tobacco abuse    Type II diabetes mellitus (HCC)     Patient Active Problem List   Diagnosis Date Noted   Sleep apnea, unspecified 11/26/2020   CAD (coronary artery disease) 11/22/2017   Chest pain, precordial 11/04/2017   Acute chest pain    Type 2 diabetes mellitus with hyperlipidemia (HCC) 10/29/2017   Unstable angina (HCC) 10/29/2017   Diabetic polyneuropathy associated with type 2 diabetes mellitus (HCC) 05/30/2017   Mixed dyslipidemia 05/30/2017   Depression 05/31/2016   Hypertension 05/31/2016   Uncontrolled type 2 diabetes mellitus with complication 04/08/2016   Uncontrolled type 2 diabetes mellitus with complication, with long-term current use of insulin 04/20/2015   Cellulitis of breast 12/29/2013   Diabetes mellitus (HCC) 12/29/2013   Cellulitis of female breast 12/29/2013   Symptomatic menopausal or female climacteric states 04/08/2008   OTHER SPECIFIED DISEASE OF NAIL 04/08/2008   URINARY URGENCY 04/08/2008   COUGH 04/02/2008   HYPERTENSION, BENIGN ESSENTIAL 04/12/2007   FATIGUE 04/12/2007    OBESITY, NOS 07/21/2006   Major depressive disorder, recurrent episode (HCC) 07/21/2006   TOBACCO DEPENDENCE 07/21/2006   GASTROESOPHAGEAL REFLUX, NO ESOPHAGITIS 07/21/2006   INCONTINENCE, STRESS, FEMALE 07/21/2006    Past Surgical History:  Procedure Laterality Date   CHOLECYSTECTOMY     CORONARY STENT INTERVENTION N/A 11/04/2017   Procedure: CORONARY STENT INTERVENTION;  Surgeon: Swaziland, Peter M, MD;  Location: [facility] INVASIVE CV LAB;  Service: Cardiovascular;  Laterality: N/A;   INCISION AND DRAINAGE ABSCESS Left 12/22/2012   Procedure: INCISION AND DRAINAGE ABSCESS;  Surgeon: Dalia Heading, MD;  Location: [facility];  Service: General;  Laterality: Left;   INTRAVASCULAR PRESSURE WIRE/FFR STUDY N/A 11/04/2017   Procedure: INTRAVASCULAR PRESSURE WIRE/FFR STUDY;  Surgeon: Swaziland, Peter M, MD;  Location: [facility] INVASIVE CV LAB;  Service: Cardiovascular;  Laterality: N/A;   LEFT HEART CATH AND CORONARY ANGIOGRAPHY N/A 11/04/2017   Procedure: LEFT HEART CATH AND CORONARY ANGIOGRAPHY;  Surgeon: Swaziland, Peter M, MD;  Location: [facility] INVASIVE CV LAB;  Service: Cardiovascular;  Laterality: N/A;   TONSILLECTOMY     TUBAL LIGATION      OB History   No obstetric history on file.      Home Medications    Prior to Admission medications   Medication Sig Start Date End Date Taking? Authorizing Provider  potassium chloride (KLOR-CON) 10 MEQ tablet Take 1 tablet (10 mEq total)  by mouth daily as needed. When you take furosemide 09/29/21  Yes Derak Schurman, Janace Aris, MD  Accu-Chek Softclix Lancets lancets Use as instructed tid before meals 09/21/21   [name], MD  amLODipine (NORVASC) 10 MG tablet Take 1 tablet (10 mg total) by mouth daily. 08/04/21 11/02/21  [name], MD  aspirin EC 81 MG tablet Take 81 mg by mouth daily.    [provider]  baclofen (LIORESAL) 10 MG tablet Take one tab PO TID PRN pain/ spasm 07/20/21   Crain, Whitney L, PA  Blood Glucose Monitoring Suppl (ACCU-CHEK GUIDE ME)  w/Device KIT 1 each by Does not apply route 3 (three) times daily. Patient not taking: Reported on 08/31/2021 11/09/18   Anders Simmonds, PA-C  Blood Glucose Monitoring Suppl (ACCU-CHEK GUIDE) w/Device KIT Use as directed 09/21/21   [name], MD  buPROPion (WELLBUTRIN SR) 150 MG 12 hr tablet Take 1 tablet (150 mg total) by mouth 2 (two) times daily. 05/05/21   [name], MD  cetirizine (ZYRTEC) 10 MG tablet TAKE 1 TABLET BY MOUTH EVERY DAY 08/02/19   [name], MD  clotrimazole-betamethasone (LOTRISONE) cream Apply 1 application topically 2 (two) times daily. 11/28/20   Candelaria Stagers, DPM  diclofenac Sodium (VOLTAREN) 1 % GEL Apply 4 g topically 4 (four) times daily. 05/05/21   [name], MD  dicyclomine (BENTYL) 10 MG capsule Take 1 capsule by mouth daily. 05/26/18   [provider]  fenofibrate (TRICOR) 145 MG tablet Take 1 tablet (145 mg total) by mouth daily. 05/05/21   [name], MD  FLUoxetine (PROZAC) 20 MG capsule TAKE 1 CAPSULE BY MOUTH EVERY DAY Patient not taking: Reported on 08/31/2021 05/05/21   [name], MD  furosemide (LASIX) 20 MG tablet Take 1 tablet (20 mg total) by mouth daily as needed for edema. 09/29/21   Zenia Resides, MD  gabapentin (NEURONTIN) 300 MG capsule Take 2 capsules (600 mg total) by mouth 2 (two) times daily. 08/04/21   [name], MD  glucose blood (ACCU-CHEK GUIDE) test strip USE TO TEST BLOOD SUGAR 2 TIMES A DAY 09/01/21   [name], MD  glucose blood (ACCU-CHEK GUIDE) test strip Use as instructed tid 09/21/21   [name], MD  insulin glargine (LANTUS SOLOSTAR) 100 UNIT/ML Solostar Pen Inject 50 Units into the skin at bedtime. 05/05/21   [name], MD  Insulin Pen Needle (COMFORT EZ PEN NEEDLES) 31G X 5 MM MISC USE AS DIRECTED ONCE DAILY 09/16/21   [name], MD  Lancets Misc. (ACCU-CHEK FASTCLIX LANCET) KIT USE AS DIRECTED Patient not taking: Reported on 08/31/2021 09/11/20   [name], MD   liraglutide (VICTOZA) 18 MG/3ML SOPN INJECT 1.2mg  SUBCUTANEOUSLY ONCE A DAY 05/05/21   [name], MD  lisinopril (ZESTRIL) 20 MG tablet Take 1 tablet (20 mg total) by mouth daily. 05/05/21   [name], MD  metoCLOPramide (REGLAN) 10 MG tablet TAKE 1 TABLET BY MOUTH EVERY EIGHT HOURS AS NEEDED FOR NAUSEA OR VOMITING. 08/14/20   Anders Simmonds, PA-C  metoprolol tartrate (LOPRESSOR) 25 MG tablet Take 1 tablet (25 mg total) by mouth 2 (two) times daily. 05/05/21   [name], MD  Multiple Vitamin (MULTIVITAMIN) tablet Take 1 tablet by mouth daily.    [provider]  naproxen (NAPROSYN) 375 MG tablet Take 1 tablet (375 mg total) by mouth 2 (two) times daily with a meal. 07/20/21   Crain, Whitney L, PA  nitroGLYCERIN (NITROSTAT) 0.4 MG SL tablet Place 0.4  mg under the tongue every 5 (five) minutes as needed for chest pain.    [provider]  pantoprazole (PROTONIX) 40 MG tablet TAKE ONE TABLET BY MOUTH ONCE DAILY 07/17/21   [name], MD  PRALUENT 75 MG/ML SOAJ INJECT 75 MG into THE SKIN every 14 DAYS Patient not taking: Reported on 08/04/2021 06/16/21   Alver Sorrow, NP  rosuvastatin (CRESTOR) 40 MG tablet Take 1 tablet (40 mg total) by mouth daily. 04/15/21   [name], MD  sitaGLIPtin-metformin (JANUMET) 50-1000 MG tablet Take 1 tablet by mouth 2 (two) times daily with a meal. 05/05/21   [name], MD  terbinafine (LAMISIL) 250 MG tablet Please take one a day x 7days, repeat every 4 weeks x 4 months 05/07/21   Lenn Sink, DPM  traZODone (DESYREL) 50 MG tablet TAKE ONE TABLET BY MOUTH EVERYDAY AT BEDTIME Patient taking differently: 2 (two) times daily. TAKE ONE TABLET BY MOUTH EVERYDAY AT BEDTIME 05/05/21   [name], MD  vitamin B-12 (CYANOCOBALAMIN) 500 MCG tablet TAKE ONE TABLET BY MOUTH ONCE DAILY 06/11/21   [name], MD  vitamin C (ASCORBIC ACID) 500 MG tablet Take 500 mg by mouth daily.    [provider]     Family History Family History  Problem Relation Age of Onset   Hypertension Mother    CVA Mother    COPD Mother    Heart disease Mother    Kidney Stones Mother    Heart attack Mother        MI in late 51's   Breast cancer Mother    CAD Father    Hypercholesterolemia Father    Heart attack Father        Died @ 47 of MI   Diabetes Sister    Cancer Maternal Uncle    Diabetes Maternal Grandmother    Cancer Maternal Grandfather        lung cancer   Colon cancer Neg Hx    Esophageal cancer Neg Hx    Stomach cancer Neg Hx    Pancreatic cancer Neg Hx     Social History Social History   Tobacco Use   Smoking status: Every Day    Packs/day: 1.00    Years: 41.00    Pack years: 41.00    Types: Cigarettes    Last attempt to quit: 10/07/2018    Years since quitting: 2.9   Smokeless tobacco: Never  Vaping Use   Vaping Use: Never used  Substance Use Topics   Alcohol use: No   Drug use: No     Allergies   Cymbalta [duloxetine hcl]   Review of Systems Review of Systems   Physical Exam Triage Vital Signs ED Triage Vitals  Enc Vitals Group     BP 09/29/21 1825 125/70     Pulse Rate 09/29/21 1825 73     Resp 09/29/21 1825 17     Temp 09/29/21 1825 97.9 F (36.6 C)     Temp Source 09/29/21 1825 Oral     SpO2 09/29/21 1825 93 %     Weight --      Height --      Head Circumference --      Peak Flow --      Pain Score 09/29/21 1826 0     Pain Loc --      Pain Edu? --      Excl. in GC? --    No data found.  Updated Vital  Signs BP 125/70 (BP Location: Left Arm)   Pulse 73   Temp 97.9 F (36.6 C) (Oral)   Resp 17   SpO2 93%   Visual Acuity Right Eye Distance:   Left Eye Distance:   Bilateral Distance:    Right Eye Near:   Left Eye Near:    Bilateral Near:     Physical Exam Vitals reviewed.  Constitutional:      General: She is not in acute distress. Cardiovascular:     Rate and Rhythm: Normal rate and regular rhythm.     Heart sounds: No  murmur heard. Pulmonary:     Effort: Pulmonary effort is normal.     Breath sounds: Normal breath sounds.  Musculoskeletal:     Right lower leg: Edema present.     Left lower leg: Edema present.     Comments: She has 3+ pitting edema with some shallow small erosions bilaterally.  Skin:    Coloration: Skin is not jaundiced or pale.  Neurological:     Mental Status: She is oriented to person, place, and time.  Psychiatric:        Behavior: Behavior normal.     UC Treatments / Results  Labs (all labs ordered are listed, but only abnormal results are displayed) Labs Reviewed - No data to display  EKG   Radiology No results found.  Procedures Procedures (including critical care time)  Medications Ordered in UC Medications - No data to display  Initial Impression / Assessment and Plan / UC Course  I have reviewed the triage vital signs and the nursing notes.  Pertinent labs & imaging results that were available during my care of the patient were reviewed by me and considered in my medical decision making (see chart for details).     At present I do not think she has cellulitis.  We will treat with low-dose furosemide and potassium.  I will do a quantity of 10 for each, and I discussed with her taking it 5 days in a row and then every other day after that.  We discussed a lower sodium diet as being beneficial.  Also she will wear compression socks once the edema is better.  She can apply over-the-counter antibiotic ointment to the open spots Final Clinical Impressions(s) / UC Diagnoses   Final diagnoses:  Leg edema     Discharge Instructions      Take furosemide 20 mg--1 daily for 5 days, and then as needed for swelling; possibly every other day  Take potassium 10 mEq 1 daily on the days you take the furosemide   Limiting your salt intake can help   Compression socks can help  Follow-up with community health and wellness on May 18     ED Prescriptions      Medication Sig Dispense Auth. Provider   furosemide (LASIX) 20 MG tablet Take 1 tablet (20 mg total) by mouth daily as needed for edema. 10 tablet Zenia Resides, MD   potassium chloride (KLOR-CON) 10 MEQ tablet Take 1 tablet (10 mEq total) by mouth daily as needed. When you take furosemide 10 tablet Marlinda Mike, Janace Aris, MD      PDMP not reviewed this encounter.   Zenia Resides, MD 09/29/21 1850    Zenia Resides, MD 09/29/21 [phone]

## 2021-09-29 NOTE — Discharge Instructions (Addendum)
Take furosemide 20 mg--1 daily for 5 days, and then as needed for swelling; possibly every other day ? ?Take potassium 10 mEq 1 daily on the days you take the furosemide ? ? ?Limiting your salt intake can help ? ? ?Compression socks can help ? ?Follow-up with community health and wellness on May 18 ?

## 2021-10-07 ENCOUNTER — Other Ambulatory Visit: Payer: Self-pay | Admitting: Family Medicine

## 2021-10-07 NOTE — Progress Notes (Signed)
Patient ID: Sarah Charles, female   DOB: 1960/10/25, 61 y.o.   MRN: [id number]    Sarah Charles, is a 61 y.o. female  [id number]  [id number]  DOB - [birth year]  Chief Complaint  Patient presents with   Leg Swelling    Bilateral        Subjective:   Sarah Charles is a 61 y.o. female here today for a follow up visit After ED visit 09/29/2021 for edema.  The edema has been improving somewhat but still persistent.  She is taking the lasix daily.  A few small sores but no weeping.  No CP or SOB.  Sedentary lifestyle.  From A/P: At present I do not think she has cellulitis.  We will treat with low-dose furosemide and potassium.  I will do a quantity of 10 for each, and I discussed with her taking it 5 days in a row and then every other day after that.  We discussed a lower sodium diet as being beneficial.  Also she will wear compression socks once the edema is better.  She can apply over-the-counter antibiotic ointment to the open spots    No problems updated.  ALLERGIES: Allergies  Allergen Reactions   Cymbalta [Duloxetine Hcl] Nausea Only    Nausea, lack of therapeutic effect    PAST MEDICAL HISTORY: Past Medical History:  Diagnosis Date   Arthritis    Coronary artery calcification seen on CT scan    a. 07/2017 noted on CT abd.   Depression    GERD (gastroesophageal reflux disease)    Hyperlipidemia    Hypertension    Obesity    Sleep apnea    a. Does not use CPAP "cause i dont think I need it anymore".   Tobacco abuse    Type II diabetes mellitus ([place])     MEDICATIONS AT HOME: Prior to Admission medications   Medication Sig Start Date End Date Taking? Authorizing Provider  Accu-Chek Softclix Lancets lancets Use as instructed tid before meals 09/21/21  Yes Newlin, Enobong, MD  amLODipine (NORVASC) 10 MG tablet Take 1 tablet (10 mg total) by mouth daily. 08/04/21 11/02/21 Yes Charlott Rakes, MD  aspirin EC 81 MG tablet Take 81 mg by mouth daily.   Yes [provider]  baclofen (LIORESAL) 10 MG tablet Take one tab PO TID PRN pain/ spasm 07/20/21  Yes Crain, Whitney L, PA  Blood Glucose Monitoring Suppl (ACCU-CHEK GUIDE ME) w/Device KIT 1 each by Does not apply route 3 (three) times daily. 11/09/18  Yes Tin Engram M, PA-C  Blood Glucose Monitoring Suppl (ACCU-CHEK GUIDE) w/Device KIT Use as directed 09/21/21  Yes Newlin, Charlane Ferretti, MD  buPROPion (WELLBUTRIN SR) 150 MG 12 hr tablet Take 1 tablet (150 mg total) by mouth 2 (two) times daily. 05/05/21  Yes Newlin, Charlane Ferretti, MD  cetirizine (ZYRTEC) 10 MG tablet TAKE 1 TABLET BY MOUTH EVERY DAY 08/02/19  Yes Newlin, Charlane Ferretti, MD  clotrimazole-betamethasone (LOTRISONE) cream Apply 1 application topically 2 (two) times daily. 11/28/20  Yes Felipa Furnace, DPM  diclofenac Sodium (VOLTAREN) 1 % GEL Apply 4 g topically 4 (four) times daily. 05/05/21  Yes Charlott Rakes, MD  dicyclomine (BENTYL) 10 MG capsule Take 1 capsule by mouth daily. 05/26/18  Yes [provider]  fenofibrate (TRICOR) 145 MG tablet Take 1 tablet (145 mg total) by mouth daily. 05/05/21  Yes Charlott Rakes, MD  FLUoxetine (PROZAC) 20 MG capsule TAKE 1 CAPSULE BY MOUTH EVERY DAY 05/05/21  Yes Charlott Rakes, MD  gabapentin (NEURONTIN) 300 MG capsule Take 2 capsules (600 mg total) by mouth 2 (two) times daily. 08/04/21  Yes Newlin, Enobong, MD  glucose blood (ACCU-CHEK GUIDE) test strip USE TO TEST BLOOD SUGAR 2 TIMES A DAY 09/01/21  Yes Newlin, Enobong, MD  glucose blood (ACCU-CHEK GUIDE) test strip Use as instructed tid 09/21/21  Yes Newlin, Enobong, MD  insulin glargine (LANTUS SOLOSTAR) 100 UNIT/ML Solostar Pen Inject 50 Units into the skin at bedtime. 05/05/21  Yes Newlin, Charlane Ferretti, MD  Insulin Pen Needle (COMFORT EZ PEN NEEDLES) 31G X 5 MM MISC USE AS DIRECTED ONCE DAILY 09/16/21  Yes Newlin, Enobong, MD  Lancets Misc. (ACCU-CHEK FASTCLIX LANCET) KIT USE AS DIRECTED 09/11/20  Yes Newlin, Enobong, MD  liraglutide (VICTOZA) 18 MG/3ML SOPN INJECT 1.6m  SUBCUTANEOUSLY ONCE A DAY 05/05/21  Yes Newlin, Enobong, MD  lisinopril (ZESTRIL) 20 MG tablet Take 1 tablet (20 mg total) by mouth daily. 05/05/21  Yes Newlin, ECharlane Ferretti MD  metoCLOPramide (REGLAN) 10 MG tablet TAKE 1 TABLET BY MOUTH EVERY EIGHT HOURS AS NEEDED FOR NAUSEA OR VOMITING. 08/14/20  Yes Tyrina Hines M, PA-C  metoprolol tartrate (LOPRESSOR) 25 MG tablet Take 1 tablet (25 mg total) by mouth 2 (two) times daily. 05/05/21  Yes NCharlott Rakes MD  Multiple Vitamin (MULTIVITAMIN) tablet Take 1 tablet by mouth daily.   Yes [provider]  mupirocin ointment (BACTROBAN) 2 % Apply 1 application. topically 2 (two) times daily. 10/08/21  Yes Arash Karstens M, PA-C  naproxen (NAPROSYN) 375 MG tablet Take 1 tablet (375 mg total) by mouth 2 (two) times daily with a meal. 07/20/21  Yes Crain, Whitney L, PA  nitroGLYCERIN (NITROSTAT) 0.4 MG SL tablet Place 0.4 mg under the tongue every 5 (five) minutes as needed for chest pain.   Yes [provider]  pantoprazole (PROTONIX) 40 MG tablet TAKE ONE TABLET BY MOUTH ONCE DAILY 07/17/21  Yes Newlin, Enobong, MD  PRALUENT 75 MG/ML SOAJ INJECT 75 MG into THE SKIN every 14 DAYS 06/16/21  Yes WLoel Dubonnet NP  rosuvastatin (CRESTOR) 40 MG tablet Take 1 tablet (40 mg total) by mouth daily. 04/15/21  Yes NCharlott Rakes MD  sitaGLIPtin-metformin (JANUMET) 50-1000 MG tablet Take 1 tablet by mouth 2 (two) times daily with a meal. 05/05/21  Yes Newlin, ECharlane Ferretti MD  terbinafine (LAMISIL) 250 MG tablet Please take one a day x 7days, repeat every 4 weeks x 4 months 05/07/21  Yes Regal, NTamala Fothergill DPM  traZODone (DESYREL) 50 MG tablet TAKE ONE TABLET BY MOUTH EVERYDAY AT BEDTIME Patient taking differently: 2 (two) times daily. TAKE ONE TABLET BY MOUTH EVERYDAY AT BEDTIME 05/05/21  Yes Newlin, Enobong, MD  vitamin B-12 (CYANOCOBALAMIN) 500 MCG tablet TAKE ONE TABLET BY MOUTH ONCE DAILY 10/07/21  Yes NCharlott Rakes MD  vitamin C (ASCORBIC ACID) 500  MG tablet Take 500 mg by mouth daily.   Yes [provider]  furosemide (LASIX) 20 MG tablet 1 tab every other day 10/08/21   MArgentina Donovan PA-C  potassium chloride (KLOR-CON) 10 MEQ tablet 1 tab every other day When you take furosemide 10/08/21   Warden Buffa, ADionne Bucy PA-C    ROS: Neg HEENT Neg resp Neg cardiac Neg GI Neg GU Neg MS Neg psych Neg neuro  Objective:   Vitals:   10/08/21 0921  BP: (!) 152/77  Pulse: 62  Temp: 98 F (36.7 C)  TempSrc: Oral  SpO2: 96%  Weight: 198 lb 6.4 oz (90 kg)  Height: _0  (  1.6 m)   Exam General appearance : Awake, alert, not in any distress. Speech Clear. Not toxic looking HEENT: Atraumatic and Normocephalic Neck: Supple, no JVD. No cervical lymphadenopathy.  Chest: Good air entry bilaterally, CTAB.  No rales/rhonchi/wheezing CVS: S1 S2 regular, no murmurs.  Extremities: B/L Lower Ext shows 1-2+ edema B with R slightly more than L.  both legs are warm to touch.  No erythema of calf.  Neg homan's B.   Neurology: Awake alert, and oriented X 3, CN II-XII intact, Non focal Skin: No Rash, small sores on BLE but none weeping or appearing infected currently  Data Review Lab Results  Component Value Date   HGBA1C 6.8 08/04/2021   HGBA1C 7.2 (A) 05/05/2021   HGBA1C 11.3 (A) 11/13/2020    Assessment & Plan   1. Type 2 diabetes mellitus with other specified complication, with long-term current use of insulin (HCC) Continue current regimen and work on diabetic diet - Glucose (CBG)  2. Edema, unspecified type Elevate legs and increase activity.  Low sugar and low salt diet.   - furosemide (LASIX) 20 MG tablet; 1 tab every other day  Dispense: 45 tablet; Refill: 1 - potassium chloride (KLOR-CON) 10 MEQ tablet; 1 tab every other day When you take furosemide  Dispense: 45 tablet; Refill: 1 - Basic metabolic panel  3. Encounter for examination following treatment at hospital  4. Sore on leg - mupirocin ointment (BACTROBAN) 2 %;  Apply 1 application. topically 2 (two) times daily.  Dispense: 22 g; Refill: 0 - Basic metabolic panel    Return for keep appt with dr Margarita Rana.  The patient was given clear instructions to go to ER or return to medical center if symptoms don't improve, worsen or new problems develop. The patient verbalized understanding. The patient was told to call to get lab results if they haven't heard anything in the next week.      Freeman Caldron, PA-C [facility], [place]   10/08/2021, 9:39 AM

## 2021-10-08 ENCOUNTER — Ambulatory Visit: Payer: Medicare (Managed Care) | Attending: Physician Assistant | Admitting: Physician Assistant

## 2021-10-08 ENCOUNTER — Encounter: Payer: Self-pay | Admitting: Physician Assistant

## 2021-10-08 VITALS — BP 152/77 | HR 62 | Temp 98.0°F | Ht 63.0 in | Wt 198.4 lb

## 2021-10-08 DIAGNOSIS — Z794 Long term (current) use of insulin: Secondary | ICD-10-CM

## 2021-10-08 DIAGNOSIS — R609 Edema, unspecified: Secondary | ICD-10-CM

## 2021-10-08 DIAGNOSIS — Z09 Encounter for follow-up examination after completed treatment for conditions other than malignant neoplasm: Secondary | ICD-10-CM

## 2021-10-08 DIAGNOSIS — L989 Disorder of the skin and subcutaneous tissue, unspecified: Secondary | ICD-10-CM

## 2021-10-08 DIAGNOSIS — E1169 Type 2 diabetes mellitus with other specified complication: Secondary | ICD-10-CM | POA: Diagnosis not present

## 2021-10-08 LAB — GLUCOSE, POCT (MANUAL RESULT ENTRY): POC Glucose: 197 mg/dl — AB (ref 70–99)

## 2021-10-08 MED ORDER — MUPIROCIN 2 % EX OINT: 1.0000 "application " | TOPICAL_OINTMENT | Freq: Two times a day (BID) | CUTANEOUS | 0 refills | Status: DC

## 2021-10-08 MED ORDER — POTASSIUM CHLORIDE ER 10 MEQ PO TBCR: EXTENDED_RELEASE_TABLET | ORAL | 1 refills | Status: DC

## 2021-10-08 MED ORDER — FUROSEMIDE 20 MG PO TABS: ORAL_TABLET | ORAL | 1 refills | Status: DC

## 2021-10-08 NOTE — Patient Instructions (Signed)
Edema ? ?Edema is when you have too much fluid in your body or under your skin. Edema may make your legs, feet, and ankles swell. Swelling often happens in looser tissues, such as around your eyes. This is a common condition. It gets more common as you get older. ?There are many possible causes of edema. These include: ?Eating too much salt (sodium). ?Being on your feet or sitting for a long time. ?Certain medical conditions, such as: ?Pregnancy. ?Heart failure. ?Liver disease. ?Kidney disease. ?Cancer. ?Hot weather may make edema worse. Edema is usually painless. Your skin may look swollen or shiny. ?Follow these instructions at home: ?Medicines ?Take over-the-counter and prescription medicines only as told by your doctor. ?Your doctor may prescribe a medicine to help your body get rid of extra water (diuretic). Take this medicine if you are told to take it. ?Eating and drinking ?Eat a low-salt (low-sodium) diet as told by your doctor. Sometimes, eating less salt may reduce swelling. ?Depending on the cause of your swelling, you may need to limit how much fluid you drink (fluid restriction). ?General instructions ?Raise the injured area above the level of your heart while you are sitting or lying down. ?Do not sit still or stand for a long time. ?Do not wear tight clothes. Do not wear garters on your upper legs. ?Exercise your legs. This can help the swelling go down. ?Wear compression stockings as told by your doctor. It is important that these are the right size. These should be prescribed by your doctor to prevent possible injuries. ?If elastic bandages or wraps are recommended, use them as told by your doctor. ?Contact a doctor if: ?Treatment is not working. ?You have heart, liver, or kidney disease and have symptoms of edema. ?You have sudden and unexplained weight gain. ?Get help right away if: ?You have shortness of breath or chest pain. ?You cannot breathe when you lie down. ?You have pain, redness, or  warmth in the swollen areas. ?You have heart, liver, or kidney disease and get edema all of a sudden. ?You have a fever and your symptoms get worse all of a sudden. ?These symptoms may be an emergency. Get help right away. Call 911. ?Do not wait to see if the symptoms will go away. ?Do not drive yourself to the hospital. ?Summary ?Edema is when you have too much fluid in your body or under your skin. ?Edema may make your legs, feet, and ankles swell. Swelling often happens in looser tissues, such as around your eyes. ?Raise the injured area above the level of your heart while you are sitting or lying down. ?Follow your doctor's instructions about diet and how much fluid you can drink. ?This information is not intended to replace advice given to you by your health care provider. Make sure you discuss any questions you have with your health care provider. ?Document Revised: 01/12/2021 Document Reviewed: 01/12/2021 ?Elsevier Patient Education ? 2023 [employer] ? ?

## 2021-10-08 NOTE — Progress Notes (Signed)
Cardiology Office Note:    Date:  10/09/2021   ID:  Sarah Charles, DOB [birth year], MRN [id number]  PCP:  Charlott Rakes, MD   [facility] Providers Cardiologist:  Freada Bergeron, MD     Referring MD: Charlott Rakes, MD   Chief Complaint: leg swelling  History of Present Illness:    Sarah Charles is a very pleasant 61 y.o. female with a hx of CAD, hypertension, sleep apnea, GERD, diabetes, hyperlipidemia, and tobacco abuse  She was evaluated in the ED 10/2017 for chest pressure.  She underwent cardiac CTA showing significant mid LAD stenosis by FFR.  She was discharged and later came for outpatient cardiac catheterization 10/2017 revealing 75% stenosis in proximal LAD treated with DES.  She was transition from atorvastatin to rosuvastatin for myalgias.   She was last seen in our office on 03/18/2021 by [name], NP at which time she needed preoperative clearance for colonoscopy.  She was advised to stop Plavix and continue aspirin due to greater than 2 years from DES.  Praluent 75 mg added to management of hyperlipidemia and she was advised to follow-up in 3 months.  Today, she is here alone for follow-up. She reports bilateral lower extremity edema for a few weeks. Went to [facility] 5/9 for continued swelling after 5 days of Lasix.  Was advised to take Lasix 20 mg for additional 5 days then every other day. Saw PCP 5/18 advised to take Lasix 20 mg for 5 days and then resume every other day dosing.  She is also on a potassium supplement.  Reports she is waiting for Lasix to be delivered by her pharmacy. She denies shortness of breath, orthopnea, PND.  No chest pain, palpitations, presyncope, syncope.  When asked about Praluent, she reports she has a box at home of a medication she is not sure the name.  She is currently living with a friend trying to find her own apartment.  States she sleeps in a chair, sometimes on the floor. Is mostly sedentary, no regular physical activity.  Admits to  eating a very high sodium diet, particularly loves potato chips.  Past Medical History:  Diagnosis Date   Arthritis    Coronary artery calcification seen on CT scan    a. 07/2017 noted on CT abd.   Depression    GERD (gastroesophageal reflux disease)    Hyperlipidemia    Hypertension    Obesity    Sleep apnea    a. Does not use CPAP "cause i dont think I need it anymore".   Tobacco abuse    Type II diabetes mellitus ([place])     Past Surgical History:  Procedure Laterality Date   CHOLECYSTECTOMY     CORONARY STENT INTERVENTION N/A 11/04/2017   Procedure: CORONARY STENT INTERVENTION;  Surgeon: Martinique, Peter M, MD;  Location: [place] [facility];  Service: Cardiovascular;  Laterality: N/A;   INCISION AND DRAINAGE ABSCESS Left 12/22/2012   Procedure: INCISION AND DRAINAGE ABSCESS;  Surgeon: Jamesetta So, MD;  Location: [facility];  Service: General;  Laterality: Left;   INTRAVASCULAR PRESSURE WIRE/FFR STUDY N/A 11/04/2017   Procedure: INTRAVASCULAR PRESSURE WIRE/FFR STUDY;  Surgeon: Martinique, Peter M, MD;  Location: [place] [facility];  Service: Cardiovascular;  Laterality: N/A;   LEFT HEART CATH AND CORONARY ANGIOGRAPHY N/A 11/04/2017   Procedure: LEFT HEART CATH AND CORONARY ANGIOGRAPHY;  Surgeon: Martinique, Peter M, MD;  Location: [facility] [place];  Service: Cardiovascular;  Laterality: N/A;   TONSILLECTOMY  TUBAL LIGATION      Current Medications: Current Meds  Medication Sig   Accu-Chek Softclix Lancets lancets Use as instructed tid before meals   amLODipine (NORVASC) 10 MG tablet Take 1 tablet (10 mg total) by mouth daily.   aspirin EC 81 MG tablet Take 81 mg by mouth daily.   baclofen (LIORESAL) 10 MG tablet Take one tab PO TID PRN pain/ spasm   Blood Glucose Monitoring Suppl (ACCU-CHEK GUIDE ME) w/Device KIT 1 each by Does not apply route 3 (three) times daily.   Blood Glucose Monitoring Suppl (ACCU-CHEK GUIDE) w/Device KIT Use as directed   buPROPion (WELLBUTRIN SR) 150 MG 12 hr  tablet Take 1 tablet (150 mg total) by mouth 2 (two) times daily.   cetirizine (ZYRTEC) 10 MG tablet TAKE 1 TABLET BY MOUTH EVERY DAY   clotrimazole-betamethasone (LOTRISONE) cream Apply 1 application topically 2 (two) times daily.   diclofenac Sodium (VOLTAREN) 1 % GEL Apply 4 g topically 4 (four) times daily.   dicyclomine (BENTYL) 10 MG capsule Take 1 capsule by mouth daily.   fenofibrate (TRICOR) 145 MG tablet Take 1 tablet (145 mg total) by mouth daily.   FLUoxetine (PROZAC) 20 MG capsule TAKE 1 CAPSULE BY MOUTH EVERY DAY   furosemide (LASIX) 20 MG tablet 1 tab every other day   gabapentin (NEURONTIN) 300 MG capsule Take 2 capsules (600 mg total) by mouth 2 (two) times daily.   glucose blood (ACCU-CHEK GUIDE) test strip USE TO TEST BLOOD SUGAR 2 TIMES A DAY   glucose blood (ACCU-CHEK GUIDE) test strip Use as instructed tid   insulin glargine (LANTUS SOLOSTAR) 100 UNIT/ML Solostar Pen Inject 50 Units into the skin at bedtime.   Insulin Pen Needle (COMFORT EZ PEN NEEDLES) 31G X 5 MM MISC USE AS DIRECTED ONCE DAILY   Lancets Misc. (ACCU-CHEK FASTCLIX LANCET) KIT USE AS DIRECTED   liraglutide (VICTOZA) 18 MG/3ML SOPN INJECT 1.48m SUBCUTANEOUSLY ONCE A DAY   lisinopril (ZESTRIL) 20 MG tablet Take 1 tablet (20 mg total) by mouth daily.   metoCLOPramide (REGLAN) 10 MG tablet TAKE 1 TABLET BY MOUTH EVERY EIGHT HOURS AS NEEDED FOR NAUSEA OR VOMITING.   metoprolol tartrate (LOPRESSOR) 25 MG tablet Take 1 tablet (25 mg total) by mouth 2 (two) times daily.   Multiple Vitamin (MULTIVITAMIN) tablet Take 1 tablet by mouth daily.   mupirocin ointment (BACTROBAN) 2 % Apply 1 application. topically 2 (two) times daily.   naproxen (NAPROSYN) 375 MG tablet Take 1 tablet (375 mg total) by mouth 2 (two) times daily with a meal.   nitroGLYCERIN (NITROSTAT) 0.4 MG SL tablet Place 0.4 mg under the tongue every 5 (five) minutes as needed for chest pain.   pantoprazole (PROTONIX) 40 MG tablet TAKE ONE TABLET BY  MOUTH ONCE DAILY   potassium chloride (KLOR-CON) 10 MEQ tablet 1 tab every other day When you take furosemide   PRALUENT 75 MG/ML SOAJ INJECT 75 MG into THE SKIN every 14 DAYS   rosuvastatin (CRESTOR) 40 MG tablet Take 1 tablet (40 mg total) by mouth daily.   sitaGLIPtin-metformin (JANUMET) 50-1000 MG tablet Take 1 tablet by mouth 2 (two) times daily with a meal.   terbinafine (LAMISIL) 250 MG tablet Please take one a day x 7days, repeat every 4 weeks x 4 months   traZODone (DESYREL) 50 MG tablet TAKE ONE TABLET BY MOUTH EVERYDAY AT BEDTIME (Patient taking differently: 2 (two) times daily. TAKE ONE TABLET BY MOUTH EVERYDAY AT BEDTIME)   vitamin  B-12 (CYANOCOBALAMIN) 500 MCG tablet TAKE ONE TABLET BY MOUTH ONCE DAILY   vitamin C (ASCORBIC ACID) 500 MG tablet Take 500 mg by mouth daily.     Allergies:   Cymbalta [duloxetine hcl]   Social History   Socioeconomic History   Marital status: Widowed    Spouse name: Not on file   Number of children: Not on file   Years of education: Not on file   Highest education level: Not on file  Occupational History   Not on file  Tobacco Use   Smoking status: Every Day    Packs/day: 1.00    Years: 41.00    Pack years: 41.00    Types: Cigarettes    Last attempt to quit: 10/07/2018    Years since quitting: 3.0   Smokeless tobacco: Never  Vaping Use   Vaping Use: Never used  Substance and Sexual Activity   Alcohol use: No   Drug use: No   Sexual activity: Never    Birth control/protection: None  Other Topics Concern   Not on file  Social History Narrative   Lives in [place] with husband.  Unemployed.  Does not routinely exercise.   Social Determinants of Health   Financial Resource Strain: Low Risk    Difficulty of Paying Living Expenses: Not very hard  Food Insecurity: No Food Insecurity   Worried About Charity fundraiser in the Last Year: Never true   Ran Out of Food in the Last Year: Never true  Transportation Needs: No  Transportation Needs   Lack of Transportation (Medical): No   Lack of Transportation (Non-Medical): No  Physical Activity: Unknown   Days of Exercise per Week: Not on file   Minutes of Exercise per Session: 0 min  Stress: No Stress Concern Present   Feeling of Stress : Only a little  Social Connections: Socially Isolated   Frequency of Communication with Friends and Family: More than three times a week   Frequency of Social Gatherings with Friends and Family: Once a week   Attends Religious Services: Never   Marine scientist or Organizations: No   Attends Archivist Meetings: Never   Marital Status: Widowed     Family History: The patient's family history includes Breast cancer in her mother; CAD in her father; COPD in her mother; CVA in her mother; Cancer in her maternal grandfather and maternal uncle; Diabetes in her maternal grandmother and sister; Heart attack in her father and mother; Heart disease in her mother; Hypercholesterolemia in her father; Hypertension in her mother; Kidney Stones in her mother. There is no history of Colon cancer, Esophageal cancer, Stomach cancer, or Pancreatic cancer.  ROS:   Please see the history of present illness.    + leg swelling All other systems reviewed and are negative.  Labs/Other Studies Reviewed:    The following studies were reviewed today:  Vas [place] LE Art 07/11/19  Right: Resting right ankle-brachial index is within normal range. No  evidence of significant right lower extremity arterial disease. The right  toe-brachial index is normal.   Left: Resting left ankle-brachial index is within normal range. No  evidence of significant left lower extremity arterial disease. The left  toe-brachial index is normal.   LHC 11/04/17  Prox LAD lesion is 70% stenosed. Prox Cx to Mid Cx lesion is 30% stenosed. Prox RCA to Mid RCA lesion is 20% stenosed. Post intervention, there is a 0% residual stenosis. A drug-eluting stent  was  successfully placed using a STENT SIERRA 2.50 X 15 MM. LV end diastolic pressure is normal.   1. Single vessel obstructive CAD.    - 70% mid LAD immediately after the first diagonal. FFR 0.78 2. Normal LVEDP 3. Successful PCI of the mid LAD with DES   Plan: DAPT for at least 6 months. She is a candidate for same day discharge. Will hold metformin for 48 hours. Will need to switch Prilosec to Protonix  Coronary CTA 12/01/17    1. Left Main:  No significant stenosis.   2. LAD: Proximal CT FFR: 0.95, mid: 0.78, distal: 0.68. 3. D1: CT FFR: 0.77. 4. LCX: Proximal CT FFR: 0.96, distal CT FFR: 0.76. 5. RCA: No significant stenosis.   IMPRESSION: 1. CT FFR showed significant stenosis in the mid LAD. Cardiac catheterization is recommended.  Echo 10/30/17  Left ventricle:  The cavity size was normal. Systolic function was  normal. The estimated ejection fraction was in the range of 55% to  60%.  Aortic valve:   Structurally normal valve.   Cusp separation was  normal.  Doppler:  Transvalvular velocity was within the normal  range. There was no stenosis. There was no regurgitation.  Mitral valve:   Structurally normal valve.   Leaflet separation was  normal.  Doppler:  Transvalvular velocity was within the normal  range. There was no evidence for stenosis. There was no  regurgitation.  Left atrium:  The atrium was normal in size.  Right ventricle:  The cavity size was normal. Wall thickness was  normal. Systolic function was normal.  Tricuspid valve:   Structurally normal valve.   Leaflet separation  was normal.  Doppler:  Transvalvular velocity was within the normal  range. There was trivial regurgitation.  Right atrium:  The atrium was normal in size.  Pericardium: There was no pericardial effusion.   Recent Labs: 08/28/2021: ALT 18; B Natriuretic Peptide 16.2; Hemoglobin 13.2; Platelets 283; TSH 1.977 10/08/2021: BUN 24; Creatinine, Ser 0.66; Potassium 4.2; Sodium 143  Recent  Lipid Panel    Component Value Date/Time   CHOL 208 (H) 08/14/2020 1124   TRIG 187 (H) 08/14/2020 1124   HDL 53 08/14/2020 1124   CHOLHDL 3.9 08/14/2020 1124   CHOLHDL 9.7 (H) 07/23/2016 1638   VLDL NOT CALC 07/23/2016 1638   LDLCALC 122 (H) 08/14/2020 1124     Risk Assessment/Calculations:      Physical Exam:    VS:  BP 128/70   Pulse 66   Ht _0  (1.575 m)   Wt 198 lb 12.8 oz (90.2 kg)   SpO2 94%   BMI 36.36 kg/m     Wt Readings from Last 3 Encounters:  10/09/21 198 lb 12.8 oz (90.2 kg)  10/08/21 198 lb 6.4 oz (90 kg)  08/31/21 191 lb (86.6 kg)     GEN:  Well developed, obese female in no acute distress HEENT: Normal NECK: No JVD; No carotid bruits CARDIAC: RRR, 2/6 systolic murmur LUSB. No rubs, gallops RESPIRATORY:  Clear to auscultation without rales, wheezing or rhonchi  ABDOMEN: Soft, non-tender, non-distended MUSCULOSKELETAL:  Bilateral lower extremity edema, skin is shiny. No deformity. 2+ pedal pulses, equal bilaterally SKIN: Warm and dry NEUROLOGIC:  Alert and oriented x 3 PSYCHIATRIC:  Normal affect   EKG:  EKG is ordered today.  The ekg ordered today demonstrates NSR at 66 bpm, no ST/T wave abnormalities  Diagnoses:    1. Leg swelling   2. Coronary artery disease of native artery  of native heart with stable angina pectoris ([place])   3. Essential hypertension   4. Hyperlipidemia LDL goal <70    Assessment and Plan:     Leg swelling: Bilateral leg swelling for several weeks that by her report has been resistant to diuretics. Also reports she is waiting for pharmacy to deliver Lasix, so unsure how much she has taken. She does not elevate her legs and admits to high sodium diet.  We will try to double the dose of Lasix for 3 days to 40 mg daily and then resume 20 mg daily.  We will get echocardiogram in the setting of systolic murmur.  Encouraged her to reduce sodium intake and elevate legs above the level of her heart when sitting.  Murmur: Soft  systolic murmur on exam.  History of trivial tricuspid valve regurgitation on echo 10/2017. She denies palpitations, chest pain, syncope, or dyspnea.    CAD without angina: PCI/DES to mid LAD 10/2017. She denies chest pain, dyspnea, or other symptoms concerning for angina.  No indication for further ischemic evaluation at this time.   Hypertension: BP is well-controlled. She does not monitor at home. She has a long list of medications. Is followed close by PCP. We are increasing Lasix for a short time and will follow-up with bmet in 7-10 days.   Hyperlipidemia LDL goal < 70: LDL above goal at 122 on 08/14/2020. Prescribed Praluent but unsure whether she has started. I have asked her to check at home and call back to let us know. Would favor starting Praluent if this is affordable. Will seek assistance of Pharm D when patient calls back. Continue rosuvastatin.      Disposition: 3 months with Dr. Johney Frame or APP  Medication Adjustments/Labs and Tests Ordered: Current medicines are reviewed at length with the patient today.  Concerns regarding medicines are outlined above.  Orders Placed This Encounter  Procedures   Basic Metabolic Panel (BMET)   ECHOCARDIOGRAM COMPLETE   No orders of the defined types were placed in this encounter.   Patient Instructions  Medication Instructions:   INCREASE Lasix two (2) tablets by mouth (40 mg) X 3 days than go back to one (1) tablet by mouth (20 mg) daily.   *If you need a refill on your cardiac medications before your next appointment, please call your pharmacy*   Lab Work:  Your physician recommends that you return for lab work on  Tuesday, May 30. You can come in on the day of your appointment anytime between 7:30-4:30.     If you have labs (blood work) drawn today and your tests are completely normal, you will receive your results only by: [place] (if you have MyChart) OR A paper copy in the mail If you have any lab test that is  abnormal or we need to change your treatment, we will call you to review the results.  Testing/Procedures:  Your physician has requested that you have an echocardiogram. Echocardiography is a painless test that uses sound waves to create images of your heart. It provides your doctor with information about the size and shape of your heart and how well your heart's chambers and valves are working. This procedure takes approximately one hour. There are no restrictions for this procedure.  Follow-Up: At [facility], you and your health needs are our priority.  As part of our continuing mission to provide you with exceptional heart care, we have created designated Provider Care Teams.  These Care Teams include your  primary Cardiologist (physician) and Advanced Practice Providers (APPs -  Physician Assistants and Nurse Practitioners) who all work together to provide you with the care you need, when you need it.  We recommend signing up for the patient portal called "MyChart".  Sign up information is provided on this After Visit Summary.  MyChart is used to connect with patients for Virtual Visits (Telemedicine).  Patients are able to view lab/test results, encounter notes, upcoming appointments, etc.  Non-urgent messages can be sent to your provider as well.   To learn more about what you can do with MyChart, go to [email or web address].    Your next appointment:   3 month(s)  The format for your next appointment:   In Person  Provider:   Freada Bergeron, MD     Other Instructions  Look at home to see if you have Praulent and let us know.   Important Information About Sugar         Signed, Emmaline Life, NP  10/09/2021 12:14 PM     [facility]

## 2021-10-09 ENCOUNTER — Ambulatory Visit (INDEPENDENT_AMBULATORY_CARE_PROVIDER_SITE_OTHER): Payer: Medicare (Managed Care) | Admitting: Nurse Practitioner

## 2021-10-09 ENCOUNTER — Encounter: Payer: Self-pay | Admitting: Nurse Practitioner

## 2021-10-09 VITALS — BP 128/70 | HR 66 | Ht 62.0 in | Wt 198.8 lb

## 2021-10-09 DIAGNOSIS — I10 Essential (primary) hypertension: Secondary | ICD-10-CM | POA: Diagnosis not present

## 2021-10-09 DIAGNOSIS — I25118 Atherosclerotic heart disease of native coronary artery with other forms of angina pectoris: Secondary | ICD-10-CM

## 2021-10-09 DIAGNOSIS — E785 Hyperlipidemia, unspecified: Secondary | ICD-10-CM | POA: Diagnosis not present

## 2021-10-09 DIAGNOSIS — M7989 Other specified soft tissue disorders: Secondary | ICD-10-CM

## 2021-10-09 LAB — BASIC METABOLIC PANEL
BUN/Creatinine Ratio: 36 — ABNORMAL HIGH (ref 12–28)
BUN: 24 mg/dL (ref 8–27)
CO2: 26 mmol/L (ref 20–29)
Calcium: 9.8 mg/dL (ref 8.7–10.3)
Chloride: 104 mmol/L (ref 96–106)
Creatinine, Ser: 0.66 mg/dL (ref 0.57–1.00)
Glucose: 181 mg/dL — ABNORMAL HIGH (ref 70–99)
Potassium: 4.2 mmol/L (ref 3.5–5.2)
Sodium: 143 mmol/L (ref 134–144)
eGFR: 100 mL/min/{1.73_m2} (ref >59)

## 2021-10-09 NOTE — Patient Instructions (Signed)
Medication Instructions:   INCREASE Lasix two (2) tablets by mouth (40 mg) X 3 days than go back to one (1) tablet by mouth (20 mg) daily.   *If you need a refill on your cardiac medications before your next appointment, please call your pharmacy*   Lab Work:  Your physician recommends that you return for lab work on  Tuesday, May 30. You can come in on the day of your appointment anytime between 7:30-4:30.     If you have labs (blood work) drawn today and your tests are completely normal, you will receive your results only by: MyChart Message (if you have MyChart) OR A paper copy in the mail If you have any lab test that is abnormal or we need to change your treatment, we will call you to review the results.  Testing/Procedures:  Your physician has requested that you have an echocardiogram. Echocardiography is a painless test that uses sound waves to create images of your heart. It provides your doctor with information about the size and shape of your heart and how well your heart's chambers and valves are working. This procedure takes approximately one hour. There are no restrictions for this procedure.  Follow-Up: At [facility], you and your health needs are our priority.  As part of our continuing mission to provide you with exceptional heart care, we have created designated Provider Care Teams.  These Care Teams include your primary Cardiologist (physician) and Advanced Practice Providers (APPs -  Physician Assistants and Nurse Practitioners) who all work together to provide you with the care you need, when you need it.  We recommend signing up for the patient portal called "MyChart".  Sign up information is provided on this After Visit Summary.  MyChart is used to connect with patients for Virtual Visits (Telemedicine).  Patients are able to view lab/test results, encounter notes, upcoming appointments, etc.  Non-urgent messages can be sent to your provider as well.   To learn  more about what you can do with MyChart, go to [email or web address].    Your next appointment:   3 month(s)  The format for your next appointment:   In Person  Provider:   Meriam Sprague, MD     Other Instructions  Look at home to see if you have Praulent and let us know.   Important Information About Sugar

## 2021-10-09 NOTE — Addendum Note (Signed)
Addended by: Gaetano Net on: 10/09/2021 03:40 PM   Modules accepted: Orders

## 2021-10-12 ENCOUNTER — Ambulatory Visit: Payer: Self-pay

## 2021-10-12 NOTE — Telephone Encounter (Signed)
Second attempt to contact pt. LM on VM to call office back.

## 2021-10-12 NOTE — Telephone Encounter (Signed)
Summary: Rash under breast advice   Pt is calling to report that she has a rash under both her breast. Rash is raw and red and irritated. Rash started yesterday. Pt reports that she get this rash every summer. No available appts. Please advise      3rd attempt to contact patient to review sx of skin irritation/ rash under breasts. No answer, LVMTCB [phone].

## 2021-10-12 NOTE — Telephone Encounter (Signed)
Summary: Rash under breast advice   Pt is calling to report that she has a rash under both her breast. Rash is raw and red and irritated. Rash started yesterday. Pt reports that she get this rash every summer. No available appts. Please advise      Attempted to contact pt. LM on VM to call back

## 2021-10-13 ENCOUNTER — Ambulatory Visit: Payer: Self-pay | Admitting: *Deleted

## 2021-10-13 NOTE — Telephone Encounter (Signed)
  Chief Complaint: Rash under breasts Symptoms: Red raised moist rash under breasts, itchy. "I get this every summer." Mild pain "When scratching" Frequency: 4days ago Pertinent Negatives: Patient denies fever Disposition: [] ED /[] Urgent Care (no appt availability in office) / [] Appointment(In office/virtual)/ []  [facility]/ [x] Home Care/ [] Refused Recommended Disposition /[] [facility]/ []  Follow-up with PCP Additional Notes: Pt verbalizes understanding of care advise. Reason for Disposition  Mild localized rash  Answer Assessment - Initial Assessment Questions 1. APPEARANCE of RASH: "Describe the rash."      Red raised areas 2. LOCATION: "Where is the rash located?"      Under both breasts 3. NUMBER: "How many spots are there?"      Multiple 4. SIZE: "How big are the spots?" (Inches, centimeters or compare to size of a coin)      Smaller than pencil tip 5. ONSET: "When did the rash start?"      4 days ago 6. ITCHING: "Does the rash itch?" If Yes, ask: "How bad is the itch?"  (Scale 0-10; or none, mild, moderate, severe)     At times 7. PAIN: "Does the rash hurt?" If Yes, ask: "How bad is the pain?"  (Scale 0-10; or none, mild, moderate, severe)    - NONE (0): no pain    - MILD (1-3): doesn't interfere with normal activities     - MODERATE (4-7): interferes with normal activities or awakens from sleep     - SEVERE (8-10): excruciating pain, unable to do any normal activities     Mild when scratching 8. OTHER SYMPTOMS: "Do you have any other symptoms?" (e.g., fever)     No  Protocols used: Rash or Redness - Localized-A-AH

## 2021-10-15 ENCOUNTER — Telehealth: Payer: Self-pay | Admitting: Cardiology

## 2021-10-15 ENCOUNTER — Other Ambulatory Visit: Payer: Self-pay | Admitting: Family Medicine

## 2021-10-15 DIAGNOSIS — K219 Gastro-esophageal reflux disease without esophagitis: Secondary | ICD-10-CM

## 2021-10-15 DIAGNOSIS — I2510 Atherosclerotic heart disease of native coronary artery without angina pectoris: Secondary | ICD-10-CM

## 2021-10-15 DIAGNOSIS — E1169 Type 2 diabetes mellitus with other specified complication: Secondary | ICD-10-CM

## 2021-10-15 DIAGNOSIS — I739 Peripheral vascular disease, unspecified: Secondary | ICD-10-CM

## 2021-10-15 DIAGNOSIS — E1165 Type 2 diabetes mellitus with hyperglycemia: Secondary | ICD-10-CM

## 2021-10-15 NOTE — Telephone Encounter (Signed)
Pt returning call, she states that she is unsure as to who called. Please advise

## 2021-10-15 NOTE — Telephone Encounter (Addendum)
Called patient and adv I did not see where we had tried to reach her.  Suggested she reach out to PCP to see if they may have been who she missed a call from.  Confirmed she is aware of her lab appointment next week and echo the week after.

## 2021-10-19 ENCOUNTER — Emergency Department (HOSPITAL_COMMUNITY)
Admission: EM | Admit: 2021-10-19 | Discharge: 2021-10-19 | Disposition: A | Discharge status: Home / Self Care | Payer: Medicare (Managed Care) | Attending: Emergency Medicine | Admitting: Emergency Medicine

## 2021-10-19 ENCOUNTER — Other Ambulatory Visit: Payer: Self-pay

## 2021-10-19 ENCOUNTER — Emergency Department (HOSPITAL_BASED_OUTPATIENT_CLINIC_OR_DEPARTMENT_OTHER): Payer: Medicare (Managed Care)

## 2021-10-19 DIAGNOSIS — Z794 Long term (current) use of insulin: Secondary | ICD-10-CM | POA: Diagnosis not present

## 2021-10-19 DIAGNOSIS — R600 Localized edema: Secondary | ICD-10-CM | POA: Insufficient documentation

## 2021-10-19 DIAGNOSIS — Z79899 Other long term (current) drug therapy: Secondary | ICD-10-CM | POA: Insufficient documentation

## 2021-10-19 DIAGNOSIS — M7989 Other specified soft tissue disorders: Secondary | ICD-10-CM

## 2021-10-19 DIAGNOSIS — Z7984 Long term (current) use of oral hypoglycemic drugs: Secondary | ICD-10-CM | POA: Insufficient documentation

## 2021-10-19 DIAGNOSIS — Z7982 Long term (current) use of aspirin: Secondary | ICD-10-CM | POA: Diagnosis not present

## 2021-10-19 DIAGNOSIS — I10 Essential (primary) hypertension: Secondary | ICD-10-CM | POA: Diagnosis not present

## 2021-10-19 DIAGNOSIS — E1165 Type 2 diabetes mellitus with hyperglycemia: Secondary | ICD-10-CM | POA: Insufficient documentation

## 2021-10-19 DIAGNOSIS — I2510 Atherosclerotic heart disease of native coronary artery without angina pectoris: Secondary | ICD-10-CM | POA: Diagnosis not present

## 2021-10-19 DIAGNOSIS — M79661 Pain in right lower leg: Secondary | ICD-10-CM

## 2021-10-19 LAB — CBC WITH DIFFERENTIAL/PLATELET
Abs Immature Granulocytes: 0.02 10*3/uL (ref 0.00–0.07)
Basophils Absolute: 0.1 10*3/uL (ref 0.0–0.1)
Basophils Relative: 1 %
Eosinophils Absolute: 0.1 10*3/uL (ref 0.0–0.5)
Eosinophils Relative: 2 %
HCT: 36.8 % (ref 36.0–46.0)
Hemoglobin: 12.1 g/dL (ref 12.0–15.0)
Immature Granulocytes: 0 %
Lymphocytes Relative: 29 %
Lymphs Abs: 1.7 10*3/uL (ref 0.7–4.0)
MCH: 30.6 pg (ref 26.0–34.0)
MCHC: 32.9 g/dL (ref 30.0–36.0)
MCV: 93.2 fL (ref 80.0–100.0)
Monocytes Absolute: 0.4 10*3/uL (ref 0.1–1.0)
Monocytes Relative: 7 %
Neutro Abs: 3.6 10*3/uL (ref 1.7–7.7)
Neutrophils Relative %: 61 %
Platelets: 257 10*3/uL (ref 150–400)
RBC: 3.95 MIL/uL (ref 3.87–5.11)
RDW: 13 % (ref 11.5–15.5)
WBC: 6 10*3/uL (ref 4.0–10.5)
nRBC: 0 % (ref 0.0–0.2)

## 2021-10-19 LAB — COMPREHENSIVE METABOLIC PANEL
ALT: 20 U/L (ref 0–44)
AST: 20 U/L (ref 15–41)
Albumin: 3.3 g/dL — ABNORMAL LOW (ref 3.5–5.0)
Alkaline Phosphatase: 75 U/L (ref 38–126)
Anion gap: 6 (ref 5–15)
BUN: 22 mg/dL (ref 8–23)
CO2: 26 mmol/L (ref 22–32)
Calcium: 9.4 mg/dL (ref 8.9–10.3)
Chloride: 107 mmol/L (ref 98–111)
Creatinine, Ser: 0.64 mg/dL (ref 0.44–1.00)
GFR, Estimated: >60 mL/min (ref >60)
Glucose, Bld: 263 mg/dL — ABNORMAL HIGH (ref 70–99)
Potassium: 3.8 mmol/L (ref 3.5–5.1)
Sodium: 139 mmol/L (ref 135–145)
Total Bilirubin: <0.1 mg/dL — ABNORMAL LOW (ref 0.3–1.2)
Total Protein: 6.3 g/dL — ABNORMAL LOW (ref 6.5–8.1)

## 2021-10-19 LAB — BRAIN NATRIURETIC PEPTIDE: B Natriuretic Peptide: 11.7 pg/mL (ref 0.0–100.0)

## 2021-10-19 MED ORDER — FUROSEMIDE 20 MG PO TABS: 20.0000 mg | ORAL_TABLET | Freq: Every day | ORAL | 0 refills | Status: DC

## 2021-10-19 NOTE — ED Triage Notes (Signed)
Pt with RLE swelling and pain x 2 months. Seen at [facility] and PCP for same and has been prescribed Lasix, which she has been taking without improvement.

## 2021-10-19 NOTE — Discharge Instructions (Signed)
Your ultrasound was negative for any blood clots.  Please continue to take the Lasix you are prescribed.  Please follow-up with your cardiologist.  I have given you a prescription for a few more tablets of Lasix to take as you were directed by your cardiologist.  Return to the ER for any new or concerning symptoms.

## 2021-10-19 NOTE — ED Provider Triage Note (Addendum)
Emergency Medicine Provider Triage Evaluation Note  Sarah Charles , a 61 y.o. female  was evaluated in triage.  Pt complains of bilateral legs swelling for the past two weeks. The patient was seen in urgent care on 5/9 and was given water pills she reports they got better but when she ran out they were swollen again. Denies any chest pain or SOB.  Review of Systems  Positive:  Negative:   Physical Exam  BP (!) 159/62   Pulse 62   Temp 98.6 F (37 C) (Oral)   Resp 16   SpO2 97%  Gen:   Awake, no distress   Resp:  Normal effort  MSK:   Moves extremities without difficulty  Other:  Bilateral 1-2+ pitting edema.   Medical Decision Making  Medically screening exam initiated at 1:44 PM.  Appropriate orders placed.  Shivangi Lutz was informed that the remainder of the evaluation will be completed by another provider, this initial triage assessment does not replace that evaluation, and the importance of remaining in the ED until their evaluation is complete.  I think this is more fluid than DVT. Patient reports that her right leg appears bigger to her. On my evaluation, legs look symmetrical although limited due to patient's clothing. Will need further evaluation to determine DVT study.   Achille Rich, PA-C 10/19/21 1347    Achille Rich, PA-C 10/19/21 1347

## 2021-10-19 NOTE — ED Provider Notes (Signed)
[facility] EMERGENCY DEPARTMENT Provider Note   CSN: [id number] Arrival date & time: 10/19/21  1252     History  Chief Complaint  Patient presents with   Leg Swelling    Kamiyah Kindel is a 61 y.o. female.  HPI Patient is a 61 year old female with past medical history significant for HTN, HLD, sleep apnea, DM 2, CAD status post PCI, depression, obesity  Patient is presented emergency room today with complaints of bilateral leg swelling for a few months however she states that she woke up this morning and noticed that her right leg was more swollen and feels somewhat tight and uncomfortable.  She states that she has been prescribed Lasix but states that she ran out of this.  She is uncertain how much she is taking.  She states that her last dose of Lasix was 2 days ago.  She denies any chest pain difficulty breathing nausea vomiting cough hemoptysis lightheadedness or dizziness.     Home Medications Prior to Admission medications   Medication Sig Start Date End Date Taking? Authorizing Provider  Accu-Chek Softclix Lancets lancets Use as instructed tid before meals 09/21/21   Charlott Rakes, MD  amLODipine (NORVASC) 10 MG tablet Take 1 tablet (10 mg total) by mouth daily. 08/04/21 11/02/21  Charlott Rakes, MD  aspirin EC 81 MG tablet Take 81 mg by mouth daily.    [provider]  baclofen (LIORESAL) 10 MG tablet Take one tab PO TID PRN pain/ spasm 07/20/21   Crain, Whitney L, PA  Blood Glucose Monitoring Suppl (ACCU-CHEK GUIDE ME) w/Device KIT 1 each by Does not apply route 3 (three) times daily. 11/09/18   [name], PA-C  Blood Glucose Monitoring Suppl (ACCU-CHEK GUIDE) w/Device KIT Use as directed 09/21/21   Charlott Rakes, MD  buPROPion (WELLBUTRIN SR) 150 MG 12 hr tablet Take 1 tablet (150 mg total) by mouth 2 (two) times daily. 05/05/21   Charlott Rakes, MD  cetirizine (ZYRTEC) 10 MG tablet TAKE 1 TABLET BY MOUTH EVERY DAY 08/02/19   Charlott Rakes, MD  clotrimazole-betamethasone (LOTRISONE) cream Apply 1 application topically 2 (two) times daily. 11/28/20   Felipa Furnace, DPM  diclofenac Sodium (VOLTAREN) 1 % GEL Apply 4 g topically 4 (four) times daily. 05/05/21   Charlott Rakes, MD  dicyclomine (BENTYL) 10 MG capsule Take 1 capsule by mouth daily. 05/26/18   [provider]  fenofibrate (TRICOR) 145 MG tablet Take 1 tablet (145 mg total) by mouth daily. 05/05/21   Charlott Rakes, MD  FLUoxetine (PROZAC) 20 MG capsule TAKE 1 CAPSULE BY MOUTH EVERY DAY 05/05/21   Charlott Rakes, MD  furosemide (LASIX) 20 MG tablet 1 tab every other day 10/08/21   [name], PA-C  gabapentin (NEURONTIN) 300 MG capsule Take 2 capsules (600 mg total) by mouth 2 (two) times daily. 08/04/21   Charlott Rakes, MD  glucose blood (ACCU-CHEK GUIDE) test strip USE TO TEST BLOOD SUGAR 2 TIMES A DAY 09/01/21   Charlott Rakes, MD  glucose blood (ACCU-CHEK GUIDE) test strip Use as instructed tid 09/21/21   Charlott Rakes, MD  insulin glargine (LANTUS SOLOSTAR) 100 UNIT/ML Solostar Pen Inject 50 Units into the skin at bedtime. 05/05/21   Charlott Rakes, MD  Insulin Pen Needle (COMFORT EZ PEN NEEDLES) 31G X 5 MM MISC USE AS DIRECTED ONCE DAILY 09/16/21   Charlott Rakes, MD  Lancets Misc. (ACCU-CHEK FASTCLIX LANCET) KIT USE AS DIRECTED 09/11/20   Charlott Rakes, MD  liraglutide (  VICTOZA) 18 MG/3ML SOPN INJECT 1.56m SUBCUTANEOUSLY ONCE A DAY 05/05/21   NCharlott Rakes MD  lisinopril (ZESTRIL) 20 MG tablet Take 1 tablet (20 mg total) by mouth daily. 05/05/21   NCharlott Rakes MD  metoCLOPramide (REGLAN) 10 MG tablet TAKE 1 TABLET BY MOUTH EVERY EIGHT HOURS AS NEEDED FOR NAUSEA OR VOMITING. 08/14/20   MArgentina Donovan PA-C  metoprolol tartrate (LOPRESSOR) 25 MG tablet Take 1 tablet (25 mg total) by mouth 2 (two) times daily. 05/05/21   NCharlott Rakes MD  Multiple Vitamin (MULTIVITAMIN) tablet Take 1 tablet by mouth daily.    [provider]   mupirocin ointment (BACTROBAN) 2 % Apply 1 application. topically 2 (two) times daily. 10/08/21   MArgentina Donovan PA-C  naproxen (NAPROSYN) 375 MG tablet Take 1 tablet (375 mg total) by mouth 2 (two) times daily with a meal. 07/20/21   Crain, Whitney L, PA  nitroGLYCERIN (NITROSTAT) 0.4 MG SL tablet Place 0.4 mg under the tongue every 5 (five) minutes as needed for chest pain.    [provider]  pantoprazole (PROTONIX) 40 MG tablet TAKE ONE TABLET BY MOUTH ONCE DAILY 10/15/21   NCharlott Rakes MD  potassium chloride (KLOR-CON) 10 MEQ tablet 1 tab every other day When you take furosemide 10/08/21   McClung, ADionne Bucy PA-C  PRALUENT 75 MG/ML SOAJ INJECT 75 MG into THE SKIN every 14 DAYS 06/16/21   WLoel Dubonnet NP  rosuvastatin (CRESTOR) 40 MG tablet TAKE ONE TABLET BY MOUTH EVERY EVENING 10/15/21   NCharlott Rakes MD  sitaGLIPtin-metformin (JANUMET) 50-1000 MG tablet Take 1 tablet by mouth 2 (two) times daily with a meal. 05/05/21   NCharlott Rakes MD  terbinafine (LAMISIL) 250 MG tablet Please take one a day x 7days, repeat every 4 weeks x 4 months 05/07/21   RWallene Huh DPM  traZODone (DESYREL) 50 MG tablet TAKE ONE TABLET BY MOUTH EVERYDAY AT BEDTIME Patient taking differently: 2 (two) times daily. TAKE ONE TABLET BY MOUTH EVERYDAY AT BEDTIME 05/05/21   NCharlott Rakes MD  vitamin B-12 (CYANOCOBALAMIN) 500 MCG tablet TAKE ONE TABLET BY MOUTH ONCE DAILY 10/07/21   NCharlott Rakes MD  vitamin C (ASCORBIC ACID) 500 MG tablet Take 500 mg by mouth daily.    [provider]      Allergies    Cymbalta [duloxetine hcl]    Review of Systems   Review of Systems  Physical Exam Updated Vital Signs BP (!) 159/62   Pulse 62   Temp 98.6 F (37 C) (Oral)   Resp 16   SpO2 97%  Physical Exam Vitals and nursing note reviewed.  Constitutional:      General: She is not in acute distress. HENT:     Head: Normocephalic and atraumatic.     Nose: Nose normal.      Mouth/Throat:     Mouth: Mucous membranes are moist.  Eyes:     General: No scleral icterus. Cardiovascular:     Rate and Rhythm: Normal rate and regular rhythm.     Pulses: Normal pulses.     Heart sounds: Normal heart sounds.  Pulmonary:     Effort: Pulmonary effort is normal. No respiratory distress.     Breath sounds: No wheezing or rales.  Abdominal:     Palpations: Abdomen is soft.     Tenderness: There is no abdominal tenderness. There is no guarding or rebound.  Musculoskeletal:     Cervical back: Normal range of motion.  Right lower leg: Edema present.     Left lower leg: Edema present.     Comments: Bilateral lower extremity edema.  There is significantly more swelling on the right leg than the left.  Somewhat tender to palpation.  No cellulitic changes of skin of right lower extremity  Skin:    General: Skin is warm and dry.     Capillary Refill: Capillary refill takes less than 2 seconds.  Neurological:     Mental Status: She is alert. Mental status is at baseline.  Psychiatric:        Mood and Affect: Mood normal.        Behavior: Behavior normal.    ED Results / Procedures / Treatments   Labs (all labs ordered are listed, but only abnormal results are displayed) Labs Reviewed  COMPREHENSIVE METABOLIC PANEL - Abnormal; Notable for the following components:      Result Value   Glucose, Bld 263 (*)    Total Protein 6.3 (*)    Albumin 3.3 (*)    Total Bilirubin <0.1 (*)    All other components within normal limits  CBC WITH DIFFERENTIAL/PLATELET  BRAIN NATRIURETIC PEPTIDE    EKG None  Radiology No results found.  Procedures Procedures    Medications Ordered in ED Medications - No data to display  ED Course/ Medical Decision Making/ A&P Clinical Course as of 10/19/21 1723  Mon Oct 19, 2021  1624 Total Protein(!): 6.3 [WF]  1624 Albumin(!): 3.3 [WF]    Clinical Course User Index [WF] Tedd Sias, [place]                           Medical  Decision Making  This patient presents to the ED for concern of bilateral leg swelling over the past 2 months now with unilateral worsening of her swelling in the right lower extremity, this involves a number of treatment options, and is a complaint that carries with it a moderate to high risk of complications and morbidity.  The differential diagnosis includes DVT, fracture, arterial injury, compartment syndrome, lymphangitis, fluid overload heart failure, renal failure, liver disease   Co morbidities: Discussed in HPI   Brief History:  Patient is a 61 year old female with past medical history significant for HTN, HLD, sleep apnea, DM 2, CAD status post PCI, depression, obesity  Patient is presented emergency room today with complaints of bilateral leg swelling for a few months however she states that she woke up this morning and noticed that her right leg was more swollen and feels somewhat tight and uncomfortable.  She states that she has been prescribed Lasix but states that she ran out of this.  She is uncertain how much she is taking.  She states that her last dose of Lasix was 2 days ago.  She denies any chest pain difficulty breathing nausea vomiting cough hemoptysis lightheadedness or dizziness.    EMR reviewed including pt PMHx, past surgical history and past visits to ER.   See HPI for more details   Lab Tests:   I ordered and independently interpreted labs. Labs notable for low protein and albumin.  Hyperglycemia without anion gap.  CBC without leukocytosis or anemia.  BNP pending   BNP is 11.7.  Not consistent with CHF.  Patient has no history of similar.   Imaging Studies:  NAD. I personally reviewed all imaging studies and no acute abnormality found. I agree with radiology interpretation.  Summary:  RIGHT:  - There is no evidence of deep vein thrombosis in the lower extremity.     - No cystic structure found in the popliteal fossa.     - Ultrasound  characteristics of enlarged lymph nodes are noted in the groin.     LEFT:  - No evidence of common femoral vein obstruction.        *See table(s) above for measurements and observations.       Cardiac Monitoring:  NA NA   Medicines ordered:    Critical Interventions:     Consults/Attending Physician   I discussed this case with my attending physician who cosigned this note including patient's presenting symptoms, physical exam, and planned diagnostics and interventions. Attending physician stated agreement with plan or made changes to plan which were implemented.   Attending physician assessed patient at bedside.    Reevaluation:  After the interventions noted above I re-evaluated patient and found that they have :stayed the same   Social Determinants of Health:      Problem List / ED Course:  Right lower extremity pain and swelling.  Legs without evidence of cellulitis.  Labs without abnormality.  Apart from mildly low protein and high blood sugar. She informs me that she has not taken the Lasix that she was prescribed.  Seems that she last took it 2 days ago but she states that it makes her pee frequently.  I did represcribe this to her as she states that she is out of it.  Although it is difficult for me to understand how she is out of this medication when she is not taking as it is prescribed I suspect that perhaps she has not filled it although she tells me that she has.  Ultimately she will follow-up with cardiology tomorrow and tells me that she will have a echocardiogram done at that time.   Dispostion:  After consideration of the diagnostic results and the patients response to treatment, I feel that the patent would benefit from outpatient follow-up   Final Clinical Impression(s) / ED Diagnoses Final diagnoses:  Leg swelling  Pain and swelling of right lower leg    Rx / DC Orders ED Discharge Orders     None         Tedd Sias,  [place] 10/19/21 1739    Lajean Saver, MD 10/20/21 2137

## 2021-10-19 NOTE — Progress Notes (Signed)
Lower extremity venous right study completed.  Preliminary results relayed to [place], [place].  See CV Proc for preliminary results report.   Jean Rosenthal, RDMS, RVT

## 2021-10-20 ENCOUNTER — Telehealth: Payer: Self-pay | Admitting: Family Medicine

## 2021-10-20 ENCOUNTER — Other Ambulatory Visit: Payer: Medicare (Managed Care) | Admitting: *Deleted

## 2021-10-20 ENCOUNTER — Ambulatory Visit: Payer: Self-pay | Admitting: *Deleted

## 2021-10-20 DIAGNOSIS — I25118 Atherosclerotic heart disease of native coronary artery with other forms of angina pectoris: Secondary | ICD-10-CM

## 2021-10-20 DIAGNOSIS — M7989 Other specified soft tissue disorders: Secondary | ICD-10-CM

## 2021-10-20 DIAGNOSIS — E785 Hyperlipidemia, unspecified: Secondary | ICD-10-CM

## 2021-10-20 DIAGNOSIS — I10 Essential (primary) hypertension: Secondary | ICD-10-CM

## 2021-10-20 LAB — BASIC METABOLIC PANEL
BUN/Creatinine Ratio: 34 — ABNORMAL HIGH (ref 12–28)
BUN: 23 mg/dL (ref 8–27)
CO2: 26 mmol/L (ref 20–29)
Calcium: 9.9 mg/dL (ref 8.7–10.3)
Chloride: 104 mmol/L (ref 96–106)
Creatinine, Ser: 0.67 mg/dL (ref 0.57–1.00)
Glucose: 87 mg/dL (ref 70–99)
Potassium: 4.2 mmol/L (ref 3.5–5.2)
Sodium: 142 mmol/L (ref 134–144)
eGFR: 99 mL/min/{1.73_m2} (ref >59)

## 2021-10-20 NOTE — Telephone Encounter (Signed)
Pt received a box with a bag with wipes and it states return back in this box. Pt does not know what the box is. Pt is ask for advice .   Chief Complaint: Unsure how to use at home colon screening kit Symptoms:  Frequency:  Pertinent Negatives: Patient denies  Disposition: _0 ED /_1 Urgent Care (no appt availability in office) / _2 Appointment(In office/virtual)/ _3  [facility]/ _4 Home Care/ _5 Refused Recommended Disposition /_6 [place] [facility]/ _7  Follow-up with PCP Additional Notes: Reviewed process with pt, pt verbalizes understanding. Advised to CB if any additional questions arise.      Reason for Disposition  General information question, no triage required and triager able to answer question  Answer Assessment - Initial Assessment Questions 1. REASON FOR CALL or QUESTION: "What is your reason for calling today?" or "How can I best help you?" or "What question do you have that I can help answer?"     Unsure how to use at home cologuard kit.  Protocols used: Information Only Call - No Triage-A-AH

## 2021-10-21 ENCOUNTER — Ambulatory Visit: Payer: Medicare (Managed Care) | Admitting: Surgery

## 2021-10-29 ENCOUNTER — Ambulatory Visit (HOSPITAL_COMMUNITY): Payer: Medicare (Managed Care) | Attending: Cardiology

## 2021-10-29 DIAGNOSIS — I25118 Atherosclerotic heart disease of native coronary artery with other forms of angina pectoris: Secondary | ICD-10-CM | POA: Diagnosis not present

## 2021-10-29 DIAGNOSIS — I10 Essential (primary) hypertension: Secondary | ICD-10-CM | POA: Insufficient documentation

## 2021-10-29 DIAGNOSIS — M7989 Other specified soft tissue disorders: Secondary | ICD-10-CM | POA: Diagnosis not present

## 2021-10-29 DIAGNOSIS — E785 Hyperlipidemia, unspecified: Secondary | ICD-10-CM | POA: Diagnosis not present

## 2021-10-29 LAB — ECHOCARDIOGRAM COMPLETE
Area-P 1/2: 3.54 cm2
S' Lateral: 1.4 cm

## 2021-11-04 ENCOUNTER — Telehealth: Payer: Self-pay | Admitting: Family Medicine

## 2021-11-04 ENCOUNTER — Other Ambulatory Visit: Payer: Self-pay | Admitting: Family Medicine

## 2021-11-04 DIAGNOSIS — F32A Depression, unspecified: Secondary | ICD-10-CM

## 2021-11-04 DIAGNOSIS — E1159 Type 2 diabetes mellitus with other circulatory complications: Secondary | ICD-10-CM

## 2021-11-04 DIAGNOSIS — Z794 Long term (current) use of insulin: Secondary | ICD-10-CM

## 2021-11-04 NOTE — Telephone Encounter (Signed)
Pt on concurrent DPP4i + GLP1 RA. Will send to PCP to review.

## 2021-11-04 NOTE — Telephone Encounter (Signed)
Copied from CRM [id number]. Topic: General - Call Back - No Documentation >> Nov 04, 2021  2:40 PM [name] wrote: Reason for CRM: Pt called and wants to know if she can continue using her accucheck meter + supplies because she does not prefer what has been called in for her. Pt wants a call back from the clinic

## 2021-11-10 NOTE — Telephone Encounter (Signed)
Pt was called and informed that she is ok to use her old meter.

## 2021-11-11 ENCOUNTER — Other Ambulatory Visit: Payer: Self-pay | Admitting: Physician Assistant

## 2021-11-11 DIAGNOSIS — L989 Disorder of the skin and subcutaneous tissue, unspecified: Secondary | ICD-10-CM

## 2021-11-12 ENCOUNTER — Encounter: Payer: Self-pay | Admitting: Family Medicine

## 2021-11-12 ENCOUNTER — Ambulatory Visit: Payer: Medicare (Managed Care) | Attending: Family Medicine | Admitting: Family Medicine

## 2021-11-12 VITALS — BP 139/81 | HR 65 | Temp 97.7°F | Ht 62.0 in | Wt 191.0 lb

## 2021-11-12 DIAGNOSIS — R600 Localized edema: Secondary | ICD-10-CM

## 2021-11-12 DIAGNOSIS — Z794 Long term (current) use of insulin: Secondary | ICD-10-CM | POA: Diagnosis not present

## 2021-11-12 DIAGNOSIS — R21 Rash and other nonspecific skin eruption: Secondary | ICD-10-CM

## 2021-11-12 DIAGNOSIS — B372 Candidiasis of skin and nail: Secondary | ICD-10-CM | POA: Diagnosis not present

## 2021-11-12 DIAGNOSIS — E1169 Type 2 diabetes mellitus with other specified complication: Secondary | ICD-10-CM | POA: Diagnosis not present

## 2021-11-12 LAB — POCT GLYCOSYLATED HEMOGLOBIN (HGB A1C): HbA1c, POC (controlled diabetic range): 7.1 % — AB (ref 0.0–7.0)

## 2021-11-12 LAB — GLUCOSE, POCT (MANUAL RESULT ENTRY): POC Glucose: 176 mg/dl — AB (ref 70–99)

## 2021-11-12 MED ORDER — ACCU-CHEK GUIDE VI STRP: ORAL_STRIP | 11 refills | Status: DC

## 2021-11-12 MED ORDER — VICTOZA 18 MG/3ML ~~LOC~~ SOPN: PEN_INJECTOR | SUBCUTANEOUS | 6 refills | Status: DC

## 2021-11-12 MED ORDER — FUROSEMIDE 20 MG PO TABS: 20.0000 mg | ORAL_TABLET | Freq: Every day | ORAL | 1 refills | Status: DC

## 2021-11-12 MED ORDER — TRIAMCINOLONE ACETONIDE 0.1 % EX CREA: 1.0000 | TOPICAL_CREAM | Freq: Two times a day (BID) | CUTANEOUS | 1 refills | Status: DC

## 2021-11-12 MED ORDER — ACCU-CHEK FASTCLIX LANCET KIT: PACK | 11 refills | Status: AC

## 2021-11-12 MED ORDER — CLOTRIMAZOLE 1 % EX CREA: 1.0000 | TOPICAL_CREAM | Freq: Two times a day (BID) | CUTANEOUS | 1 refills | Status: DC

## 2021-11-12 NOTE — Progress Notes (Signed)
Leg swelling and sores on legs.

## 2021-11-12 NOTE — Patient Instructions (Signed)
[facility]  [address] [place] [address]  ?Ph# [phone]  ?

## 2021-11-12 NOTE — Progress Notes (Signed)
Subjective:  Patient ID: Sarah Charles, female    DOB: [birth year]  Age: 61 y.o. MRN: [id number]  CC: Diabetes   HPI Sarah Charles is a 61 y.o. year old female with a history of type 2 diabetes mellitus (A1c 7.1), hypertension, hyperlipidemia, GERD, CAD (s/p DES).    Interval History: Today she complains of leg swelling and sores on her legs. She scratches her legs and has been breaking out in sores. At her last visit with cardiology on 10/09/2021 she had also complained of this and Lasix dose was increased temporarily.  She later presented to the ED with leg swelling again 1 month ago. She informs me swelling has improved but she does not want to remain on Lasix as it makes her urinate a lot.  From a diabetes standpoint she is doing well and denies hypoglycemia, numbness in extremities or visual concerns. Last eye exam was 4 months ago but she does not recall the name of her ophthalmologist. She states she would only like to use Accu-Chek glucometer but not the most recent which was sent to her but she does not recall the name of the most recent one.  She has a pruritic rash under her right breast and is unsure of the duration. Past Medical History:  Diagnosis Date   Arthritis    Coronary artery calcification seen on CT scan    a. 07/2017 noted on CT abd.   Depression    GERD (gastroesophageal reflux disease)    Hyperlipidemia    Hypertension    Obesity    Sleep apnea    a. Does not use CPAP "cause i dont think I need it anymore".   Tobacco abuse    Type II diabetes mellitus ([place])     Past Surgical History:  Procedure Laterality Date   CHOLECYSTECTOMY     CORONARY STENT INTERVENTION N/A 11/04/2017   Procedure: CORONARY STENT INTERVENTION;  Surgeon: Martinique, Peter M, MD;  Location: [facility];  Service: Cardiovascular;  Laterality: N/A;   INCISION AND DRAINAGE ABSCESS Left 12/22/2012   Procedure: INCISION AND DRAINAGE ABSCESS;  Surgeon: Jamesetta So, MD;  Location: [facility];   Service: General;  Laterality: Left;   INTRAVASCULAR PRESSURE WIRE/FFR STUDY N/A 11/04/2017   Procedure: INTRAVASCULAR PRESSURE WIRE/FFR STUDY;  Surgeon: Martinique, Peter M, MD;  Location: [facility];  Service: Cardiovascular;  Laterality: N/A;   LEFT HEART CATH AND CORONARY ANGIOGRAPHY N/A 11/04/2017   Procedure: LEFT HEART CATH AND CORONARY ANGIOGRAPHY;  Surgeon: Martinique, Peter M, MD;  Location: [facility];  Service: Cardiovascular;  Laterality: N/A;   TONSILLECTOMY     TUBAL LIGATION      Family History  Problem Relation Age of Onset   Hypertension Mother    CVA Mother    COPD Mother    Heart disease Mother    Kidney Stones Mother    Heart attack Mother        MI in late 48's   Breast cancer Mother    CAD Father    Hypercholesterolemia Father    Heart attack Father        Died @ 39 of MI   Diabetes Sister    Cancer Maternal Uncle    Diabetes Maternal Grandmother    Cancer Maternal Grandfather        lung cancer   Colon cancer Neg Hx    Esophageal cancer Neg Hx    Stomach cancer Neg Hx  Pancreatic cancer Neg Hx     Social History   Socioeconomic History   Marital status: Widowed    Spouse name: Not on file   Number of children: Not on file   Years of education: Not on file   Highest education level: Not on file  Occupational History   Not on file  Tobacco Use   Smoking status: Every Day    Packs/day: 1.00    Years: 41.00    Total pack years: 41.00    Types: Cigarettes    Last attempt to quit: 10/07/2018    Years since quitting: 3.1   Smokeless tobacco: Never  Vaping Use   Vaping Use: Never used  Substance and Sexual Activity   Alcohol use: No   Drug use: No   Sexual activity: Never    Birth control/protection: None  Other Topics Concern   Not on file  Social History Narrative   Lives in [place] with husband.  Unemployed.  Does not routinely exercise.   Social Determinants of Health   Financial Resource Strain: Low Risk  (12/22/2020)    Overall Financial Resource Strain (CARDIA)    Difficulty of Paying Living Expenses: Not very hard  Food Insecurity: No Food Insecurity (12/22/2020)   Hunger Vital Sign    Worried About Running Out of Food in the Last Year: Never true    Ran Out of Food in the Last Year: Never true  Transportation Needs: No Transportation Needs (12/22/2020)   PRAPARE - Hydrologist (Medical): No    Lack of Transportation (Non-Medical): No  Physical Activity: Unknown (12/22/2020)   Exercise Vital Sign    Days of Exercise per Week: Not on file    Minutes of Exercise per Session: 0 min  Stress: No Stress Concern Present (12/22/2020)   [place]    Feeling of Stress : Only a little  Social Connections: Socially Isolated (12/22/2020)   Social Connection and Isolation Panel [NHANES]    Frequency of Communication with Friends and Family: More than three times a week    Frequency of Social Gatherings with Friends and Family: Once a week    Attends Religious Services: Never    Marine scientist or Organizations: No    Attends Archivist Meetings: Never    Marital Status: Widowed    Allergies  Allergen Reactions   Cymbalta [Duloxetine Hcl] Nausea Only    Nausea, lack of therapeutic effect    Outpatient Medications Prior to Visit  Medication Sig Dispense Refill   Accu-Chek Softclix Lancets lancets Use as instructed tid before meals 100 each 12   amLODipine (NORVASC) 10 MG tablet Take 1 tablet (10 mg total) by mouth daily. 90 tablet 1   aspirin EC 81 MG tablet Take 81 mg by mouth daily.     baclofen (LIORESAL) 10 MG tablet Take one tab PO TID PRN pain/ spasm 30 each 0   Blood Glucose Monitoring Suppl (ACCU-CHEK GUIDE ME) w/Device KIT 1 each by Does not apply route 3 (three) times daily. 1 kit 0   Blood Glucose Monitoring Suppl (ACCU-CHEK GUIDE) w/Device KIT Use as directed 1 kit 0   buPROPion (WELLBUTRIN  SR) 150 MG 12 hr tablet TAKE ONE TABLET BY MOUTH TWICE DAILY 180 tablet 0   cetirizine (ZYRTEC) 10 MG tablet TAKE 1 TABLET BY MOUTH EVERY DAY 30 tablet 2   clotrimazole-betamethasone (LOTRISONE) cream Apply 1 application topically 2 (  two) times daily. 30 g 0   diclofenac Sodium (VOLTAREN) 1 % GEL Apply 4 g topically 4 (four) times daily. 100 g 1   dicyclomine (BENTYL) 10 MG capsule Take 1 capsule by mouth daily.     fenofibrate (TRICOR) 145 MG tablet Take 1 tablet (145 mg total) by mouth daily. 90 tablet 3   FLUoxetine (PROZAC) 20 MG capsule TAKE ONE CAPSULE BY MOUTH EVERY MORNING 90 capsule 0   gabapentin (NEURONTIN) 300 MG capsule Take 2 capsules (600 mg total) by mouth 2 (two) times daily. 360 capsule 1   glucose blood (ACCU-CHEK GUIDE) test strip Use as instructed tid 100 each 12   insulin glargine (LANTUS SOLOSTAR) 100 UNIT/ML Solostar Pen Inject 50 Units into the skin at bedtime. 30 mL 3   Insulin Pen Needle (COMFORT EZ PEN NEEDLES) 31G X 5 MM MISC USE AS DIRECTED ONCE DAILY 100 each 0   lisinopril (ZESTRIL) 20 MG tablet TAKE ONE TABLET BY MOUTH ONCE DAILY 90 tablet 0   metFORMIN (GLUCOPHAGE) 500 MG tablet Take 2 tablets (1,000 mg total) by mouth 2 (two) times daily with a meal. 180 tablet 1   metoCLOPramide (REGLAN) 10 MG tablet TAKE 1 TABLET BY MOUTH EVERY EIGHT HOURS AS NEEDED FOR NAUSEA OR VOMITING. 40 tablet 0   metoprolol tartrate (LOPRESSOR) 25 MG tablet TAKE ONE TABLET BY MOUTH TWICE DAILY 180 tablet 0   Multiple Vitamin (MULTIVITAMIN) tablet Take 1 tablet by mouth daily.     mupirocin ointment (BACTROBAN) 2 % Apply 1 application. topically 2 (two) times daily. 22 g 0   naproxen (NAPROSYN) 375 MG tablet Take 1 tablet (375 mg total) by mouth 2 (two) times daily with a meal. 20 tablet 0   nitroGLYCERIN (NITROSTAT) 0.4 MG SL tablet Place 0.4 mg under the tongue every 5 (five) minutes as needed for chest pain.     pantoprazole (PROTONIX) 40 MG tablet TAKE ONE TABLET BY MOUTH ONCE DAILY  90 tablet 0   potassium chloride (KLOR-CON) 10 MEQ tablet 1 tab every other day When you take furosemide 45 tablet 1   PRALUENT 75 MG/ML SOAJ INJECT 75 MG into THE SKIN every 14 DAYS 2 mL 11   rosuvastatin (CRESTOR) 40 MG tablet TAKE ONE TABLET BY MOUTH EVERY EVENING 90 tablet 0   terbinafine (LAMISIL) 250 MG tablet Please take one a day x 7days, repeat every 4 weeks x 4 months 28 tablet 0   traZODone (DESYREL) 50 MG tablet TAKE ONE TABLET BY MOUTH EVERYDAY AT BEDTIME 90 tablet 0   vitamin B-12 (CYANOCOBALAMIN) 500 MCG tablet TAKE ONE TABLET BY MOUTH ONCE DAILY 30 tablet 2   vitamin C (ASCORBIC ACID) 500 MG tablet Take 500 mg by mouth daily.     furosemide (LASIX) 20 MG tablet 1 tab every other day 45 tablet 1   furosemide (LASIX) 20 MG tablet Take 1 tablet (20 mg total) by mouth daily. 10 tablet 0   glucose blood (ACCU-CHEK GUIDE) test strip USE TO TEST BLOOD SUGAR 2 TIMES A DAY 100 strip 2   Lancets Misc. (ACCU-CHEK FASTCLIX LANCET) KIT USE AS DIRECTED 1 kit 0   liraglutide (VICTOZA) 18 MG/3ML SOPN INJECT 1.66m SUBCUTANEOUSLY ONCE A DAY 6 mL 3   No facility-administered medications prior to visit.     ROS Review of Systems  Constitutional:  Negative for activity change and appetite change.  HENT:  Negative for sinus pressure and sore throat.   Respiratory:  Negative for chest tightness,  shortness of breath and wheezing.   Cardiovascular:  Positive for leg swelling. Negative for chest pain and palpitations.  Gastrointestinal:  Negative for abdominal distention, abdominal pain and constipation.  Genitourinary: Negative.   Musculoskeletal: Negative.   Skin:        See HPI  Psychiatric/Behavioral:  Negative for behavioral problems and dysphoric mood.     Objective:  BP 139/81   Pulse 65   Temp 97.7 F (36.5 C) (Oral)   Ht _0  (1.575 m)   Wt 191 lb (86.6 kg)   SpO2 96%   BMI 34.93 kg/m      11/12/2021    9:18 AM 10/19/2021    5:57 PM 10/19/2021    1:04 PM  BP/Weight   Systolic BP [id number]  Diastolic BP 81 90 62  Wt. (Lbs) 191    BMI 34.93 kg/m2        Physical Exam Constitutional:      Appearance: She is well-developed.  Cardiovascular:     Rate and Rhythm: Normal rate.     Heart sounds: Normal heart sounds. No murmur heard. Pulmonary:     Effort: Pulmonary effort is normal.     Breath sounds: Normal breath sounds. No wheezing or rales.  Chest:     Chest wall: No tenderness.  Abdominal:     General: Bowel sounds are normal. There is no distension.     Palpations: Abdomen is soft. There is no mass.     Tenderness: There is no abdominal tenderness.  Musculoskeletal:        General: Normal range of motion.     Right lower leg: No edema.     Left lower leg: No edema.  Skin:    Comments: Erythematous rash beneath right breast  Bilateral legs with pinpoint erythematous rash, scratch marks, left shin with area of skin exposed and erythematous base  Neurological:     Mental Status: She is alert and oriented to person, place, and time.  Psychiatric:        Mood and Affect: Mood normal.        Latest Ref Rng & Units 10/20/2021    9:07 AM 10/19/2021    1:45 PM 10/08/2021    9:44 AM  CMP  Glucose 70 - 99 mg/dL 87  263  181   BUN 8 - 27 mg/dL _1 Creatinine 0.57 - 1.00 mg/dL 0.67  0.64  0.66   Sodium 134 - 144 mmol/L 142  139  143   Potassium 3.5 - 5.2 mmol/L 4.2  3.8  4.2   Chloride 96 - 106 mmol/L 104  107  104   CO2 20 - 29 mmol/L _2 Calcium 8.7 - 10.3 mg/dL 9.9  9.4  9.8   Total Protein 6.5 - 8.1 g/dL  6.3    Total Bilirubin 0.3 - 1.2 mg/dL  <0.1    Alkaline Phos 38 - 126 U/L  75    AST 15 - 41 U/L  20    ALT 0 - 44 U/L  20      Lipid Panel     Component Value Date/Time   CHOL 208 (H) 08/14/2020 1124   TRIG 187 (H) 08/14/2020 1124   HDL 53 08/14/2020 1124   CHOLHDL 3.9 08/14/2020 1124   CHOLHDL 9.7 (H) 07/23/2016 1638   VLDL NOT CALC 07/23/2016 1638   LDLCALC 122 (H) 08/14/2020 1124    CBC  Component Value Date/Time   WBC 6.0 10/19/2021 1345   RBC 3.95 10/19/2021 1345   HGB 12.1 10/19/2021 1345   HGB 14.2 11/09/2018 1158   HCT 36.8 10/19/2021 1345   HCT 42.8 11/09/2018 1158   PLT 257 10/19/2021 1345   PLT 321 11/09/2018 1158   MCV 93.2 10/19/2021 1345   MCV 93 11/09/2018 1158   MCH 30.6 10/19/2021 1345   MCHC 32.9 10/19/2021 1345   RDW 13.0 10/19/2021 1345   RDW 12.5 11/09/2018 1158   LYMPHSABS 1.7 10/19/2021 1345   LYMPHSABS 2.0 11/09/2018 1158   MONOABS 0.4 10/19/2021 1345   EOSABS 0.1 10/19/2021 1345   EOSABS 0.1 11/09/2018 1158   BASOSABS 0.1 10/19/2021 1345   BASOSABS 0.1 11/09/2018 1158    Lab Results  Component Value Date   HGBA1C 7.1 (A) 11/12/2021    Assessment & Plan:  1. Type 2 diabetes mellitus with other specified complication, with long-term current use of insulin (HCC) Controlled with A1c of 7.1 Continue current regimen Counseled on Diabetic diet, my plate method, 409 minutes of moderate intensity exercise/week Blood sugar logs with fasting goals of 80-120 mg/dl, random of less than 180 and in the event of sugars less than 60 mg/dl or greater than 400 mg/dl encouraged to notify the clinic. Advised on the need for annual eye exams, annual foot exams, Pneumonia vaccine. - POCT glucose (manual entry) - POCT glycosylated hemoglobin (Hb A1C) - Lancets Misc. (ACCU-CHEK FASTCLIX LANCET) KIT; USE AS DIRECTED  Dispense: 1 kit; Refill: 11 - LP+Non-HDL Cholesterol - Microalbumin/Creatinine Ratio, Urine - liraglutide (VICTOZA) 18 MG/3ML SOPN; INJECT 1.62m SUBCUTANEOUSLY ONCE A DAY  Dispense: 6 mL; Refill: 6 - glucose blood (ACCU-CHEK GUIDE) test strip; USE TO TEST BLOOD SUGAR 2 TIMES A DAY  Dispense: 100 strip; Refill: 11  2. Candidal intertrigo Discussed pathophysiology of candidal intertrigo Advised to keep area dry - clotrimazole (LOTRIMIN) 1 % cream; Apply 1 Application topically 2 (two) times daily. Apply beneath both breasts  Dispense: 60 g;  Refill: 1  3. Pedal edema Improving Continue Lasix Encouraged to comply with a low-sodium diet, elevate feet, use compression stockings - furosemide (LASIX) 20 MG tablet; Take 1 tablet (20 mg total) by mouth daily.  Dispense: 90 tablet; Refill: 1  4. Rash and nonspecific skin eruption She has been picking at the lesions Also has some xerosis and has been advised to apply lotion on her skin - triamcinolone cream (KENALOG) 0.1 %; Apply 1 Application topically 2 (two) times daily. Apply to both legs  Dispense: 45 g; Refill: 1    Meds ordered this encounter  Medications   furosemide (LASIX) 20 MG tablet    Sig: Take 1 tablet (20 mg total) by mouth daily.    Dispense:  90 tablet    Refill:  1   Lancets Misc. (ACCU-CHEK FASTCLIX LANCET) KIT    Sig: USE AS DIRECTED    Dispense:  1 kit    Refill:  11   glucose blood (ACCU-CHEK GUIDE) test strip    Sig: USE TO TEST BLOOD SUGAR 2 TIMES A DAY    Dispense:  100 strip    Refill:  11    This prescription was filled on 03/18/2021. Any refills authorized will be placed on file.   triamcinolone cream (KENALOG) 0.1 %    Sig: Apply 1 Application topically 2 (two) times daily. Apply to both legs    Dispense:  45 g    Refill:  1   clotrimazole (  LOTRIMIN) 1 % cream    Sig: Apply 1 Application topically 2 (two) times daily. Apply beneath both breasts    Dispense:  60 g    Refill:  1   liraglutide (VICTOZA) 18 MG/3ML SOPN    Sig: INJECT 1.63m SUBCUTANEOUSLY ONCE A DAY    Dispense:  6 mL    Refill:  6    This prescription was filled on 01/19/2021. Any refills authorized will be placed on file.    Follow-up: Return in about 3 months (around 02/12/2022) for Chronic medical conditions.       ECharlott Rakes MD, FAAFP. [facility] [place]  11/12/2021, 12:44 PM

## 2021-11-13 LAB — MICROALBUMIN / CREATININE URINE RATIO
Creatinine, Urine: 112.7 mg/dL
Microalb/Creat Ratio: 24 mg/g creat (ref 0–29)
Microalbumin, Urine: 26.8 ug/mL

## 2021-11-13 LAB — LP+NON-HDL CHOLESTEROL
Cholesterol, Total: 137 mg/dL (ref 100–199)
HDL: 54 mg/dL (ref >39)
LDL Chol Calc (NIH): 64 mg/dL (ref 0–99)
Total Non-HDL-Chol (LDL+VLDL): 83 mg/dL (ref 0–129)
Triglycerides: 102 mg/dL (ref 0–149)
VLDL Cholesterol Cal: 19 mg/dL (ref 5–40)

## 2021-11-17 ENCOUNTER — Other Ambulatory Visit: Payer: Self-pay | Admitting: Physician Assistant

## 2021-11-17 DIAGNOSIS — L989 Disorder of the skin and subcutaneous tissue, unspecified: Secondary | ICD-10-CM

## 2021-11-22 ENCOUNTER — Other Ambulatory Visit: Payer: Self-pay | Admitting: Family Medicine

## 2021-11-22 DIAGNOSIS — Z794 Long term (current) use of insulin: Secondary | ICD-10-CM

## 2021-11-23 NOTE — Telephone Encounter (Signed)
Refilled 05/05/2021 49mL 3 refills - Should last until 01/15/2022 Requested Prescriptions  Pending Prescriptions Disp Refills  . LANTUS SOLOSTAR 100 UNIT/ML Solostar Pen [Pharmacy Med Name: Lantus Solostar U-100 Insulin 100 unit/mL (3 mL) subcutaneous pen] 30 mL 3    Sig: INJECT 50 UNITS into THE SKIN AT BEDTIME     Endocrinology:  Diabetes - Insulins Passed - 11/22/2021  8:03 AM      Passed - HBA1C is between 0 and 7.9 and within 180 days    HbA1c, POC (controlled diabetic range)  Date Value Ref Range Status  11/12/2021 7.1 (A) 0.0 - 7.0 % Final         Passed - Valid encounter within last 6 months    Recent Outpatient Visits          1 week ago Type 2 diabetes mellitus with other specified complication, with long-term current use of insulin (HCC)   [facility] [place], [place], MD   1 month ago Type 2 diabetes mellitus with other specified complication, with long-term current use of insulin [facility])   [place] [facility] [place], [place], [place]   2 months ago Localized edema   [facility] at [facility], Cari S, PA-C   3 months ago Type 2 diabetes mellitus with other specified complication, with long-term current use of insulin (HCC)   [facility] [place], [place], MD   6 months ago Hyperlipidemia associated with type 2 diabetes mellitus [facility])   [facility] [name], MD      Future Appointments            In 1 month [place], Kathlynn Grate, MD [facility] [address] Office, [facility]   In 3 months [name], MD [facility]

## 2021-11-25 ENCOUNTER — Ambulatory Visit: Payer: Medicare (Managed Care) | Admitting: Podiatry

## 2021-11-26 ENCOUNTER — Ambulatory Visit: Payer: Medicare (Managed Care) | Admitting: Gastroenterology

## 2021-12-02 ENCOUNTER — Other Ambulatory Visit: Payer: Self-pay | Admitting: Family Medicine

## 2021-12-02 DIAGNOSIS — R21 Rash and other nonspecific skin eruption: Secondary | ICD-10-CM

## 2021-12-11 ENCOUNTER — Other Ambulatory Visit: Payer: Self-pay | Admitting: Family Medicine

## 2021-12-11 DIAGNOSIS — L989 Disorder of the skin and subcutaneous tissue, unspecified: Secondary | ICD-10-CM

## 2021-12-11 DIAGNOSIS — E1169 Type 2 diabetes mellitus with other specified complication: Secondary | ICD-10-CM

## 2022-01-05 ENCOUNTER — Other Ambulatory Visit: Payer: Self-pay | Admitting: Family Medicine

## 2022-01-05 DIAGNOSIS — I152 Hypertension secondary to endocrine disorders: Secondary | ICD-10-CM

## 2022-01-05 DIAGNOSIS — E1142 Type 2 diabetes mellitus with diabetic polyneuropathy: Secondary | ICD-10-CM

## 2022-01-05 DIAGNOSIS — R21 Rash and other nonspecific skin eruption: Secondary | ICD-10-CM

## 2022-01-05 NOTE — Telephone Encounter (Signed)
Requested medication (s) are due for refill today: yes  Requested medication (s) are on the active medication list: yes  Last refill:  gabapentin 08/04/21 #360/1, triamcinolone cream 12/02/21 #45g/1  Future visit scheduled: yes  Notes to clinic:  gabapentin needing PHQ-9 completed, triamcinolone cream not delegated     Requested Prescriptions  Pending Prescriptions Disp Refills   gabapentin (NEURONTIN) 300 MG capsule [Pharmacy Med Name: gabapentin 300 mg capsule] 360 capsule 1    Sig: TAKE TWO CAPSULES BY MOUTH TWICE DAILY     Neurology: Anticonvulsants - gabapentin Failed - 01/05/2022  8:03 AM      Failed - Completed PHQ-2 or PHQ-9 in the last 360 days      Passed - Cr in normal range and within 360 days    Creat  Date Value Ref Range Status  07/23/2016 0.50 0.50 - 1.05 mg/dL Final    Comment:      For patients > or = 61 years of age: The upper reference limit for Creatinine is approximately 13% higher for people identified as African-American.      Creatinine, Ser  Date Value Ref Range Status  10/20/2021 0.67 0.57 - 1.00 mg/dL Final   Creatinine, POC  Date Value Ref Range Status  09/16/2016 100 mg/dL Final   Creatinine, Urine  Date Value Ref Range Status  07/23/2016 47 20 - 320 mg/dL Final         Passed - Valid encounter within last 12 months    Recent Outpatient Visits           1 month ago Type 2 diabetes mellitus with other specified complication, with long-term current use of insulin (HCC)   [facility] [place], [place], MD   2 months ago Type 2 diabetes mellitus with other specified complication, with long-term current use of insulin [facility])   [place] [facility] [place], [place], [place]   4 months ago Localized edema   Primary Care at [facility], Cari S, PA-C   5 months ago Type 2 diabetes mellitus with other specified complication, with long-term current use of insulin (HCC)   [place]  [facility] [place], [place], MD   8 months ago Hyperlipidemia associated with type 2 diabetes mellitus [facility])   [place] [facility] [name], MD       Future Appointments             In 1 week Shari Prows, Kathlynn Grate, MD [facility] [address] Office, [facility]   In 1 month [place], [place], MD [facility]             triamcinolone cream (KENALOG) 0.1 % [Pharmacy Med Name: triamcinolone acetonide 0.1 % topical cream] 45 g 1    Sig: APPLY TO THE AFFECTED AREA(S) ON BOTH LEGS TWICE DAILY     Not Delegated - Dermatology:  Corticosteroids Failed - 01/05/2022  8:03 AM      Failed - This refill cannot be delegated      Passed - Valid encounter within last 12 months    Recent Outpatient Visits           1 month ago Type 2 diabetes mellitus with other specified complication, with long-term current use of insulin (HCC)   [place] [facility] [facility] [place], Odette Horns, MD   2 months ago Type 2 diabetes mellitus with other specified complication, with long-term current use of insulin (  [facility])   [facility] [facility] [place], [place], [place]   4 months ago Localized edema   Primary Care at [facility], Cari S, PA-C   5 months ago Type 2 diabetes mellitus with other specified complication, with long-term current use of insulin (HCC)   [place] [facility] [place], [place], [place]   8 months ago Hyperlipidemia associated with type 2 diabetes mellitus [facility])   [place] [facility] [name], MD       Future Appointments             In 1 week Shari Prows, Kathlynn Grate, MD [facility] [address] Office, [facility]   In 1 month [name], MD [facility]            Signed Prescriptions Disp Refills   amLODipine (NORVASC) 10 MG tablet 90 tablet 1    Sig: TAKE ONE TABLET BY MOUTH ONCE DAILY      Cardiovascular: Calcium Channel Blockers 2 Passed - 01/05/2022  8:03 AM      Passed - Last BP in normal range    BP Readings from Last 1 Encounters:  11/12/21 139/81         Passed - Last Heart Rate in normal range    Pulse Readings from Last 1 Encounters:  11/12/21 65         Passed - Valid encounter within last 6 months    Recent Outpatient Visits           1 month ago Type 2 diabetes mellitus with other specified complication, with long-term current use of insulin (HCC)   [place] [facility] [place], [place], MD   2 months ago Type 2 diabetes mellitus with other specified complication, with long-term current use of insulin [facility])   [place] [facility] [place], [place], [place]   4 months ago Localized edema   [facility] at [facility], Cari S, PA-C   5 months ago Type 2 diabetes mellitus with other specified complication, with long-term current use of insulin (HCC)   [place] [facility] [place], [place], [place]   8 months ago Hyperlipidemia associated with type 2 diabetes mellitus [facility])   [place] [facility] [name], MD       Future Appointments             In 1 week Shari Prows, Kathlynn Grate, MD [facility] [address] Office, [facility]   In 1 month [name], MD [facility]

## 2022-01-05 NOTE — Telephone Encounter (Signed)
Requested Prescriptions  Pending Prescriptions Disp Refills  . amLODipine (NORVASC) 10 MG tablet [Pharmacy Med Name: amlodipine 10 mg tablet] 90 tablet 1    Sig: TAKE ONE TABLET BY MOUTH ONCE DAILY     Cardiovascular: Calcium Channel Blockers 2 Passed - 01/05/2022  8:03 AM      Passed - Last BP in normal range    BP Readings from Last 1 Encounters:  11/12/21 139/81         Passed - Last Heart Rate in normal range    Pulse Readings from Last 1 Encounters:  11/12/21 65         Passed - Valid encounter within last 6 months    Recent Outpatient Visits          1 month ago Type 2 diabetes mellitus with other specified complication, with long-term current use of insulin (HCC)   [facility] [place], [place], [place]   2 months ago Type 2 diabetes mellitus with other specified complication, with long-term current use of insulin [facility])   [facility] [place], [place], [place]   4 months ago Localized edema   [facility] at [facility], Cari S, PA-C   5 months ago Type 2 diabetes mellitus with other specified complication, with long-term current use of insulin (HCC)   [facility] [place], [place], [place]   8 months ago Hyperlipidemia associated with type 2 diabetes mellitus [facility])   [facility] [name], MD      Future Appointments            In 1 week Pember[place], Kathlynn Grate, MD [facility] [address] Office, [address]   In 1 month [name], MD [facility]           . gabapentin (NEURONTIN) 300 MG capsule [Pharmacy Med Name: gabapentin 300 mg capsule] 360 capsule 1    Sig: TAKE TWO CAPSULES BY MOUTH TWICE DAILY     Neurology: Anticonvulsants - gabapentin Failed - 01/05/2022  8:03 AM      Failed - Completed PHQ-2 or PHQ-9 in the last 360 days      Passed - Cr in normal range and within 360 days    Creat  Date Value Ref  Range Status  07/23/2016 0.50 0.50 - 1.05 mg/dL Final    Comment:      For patients > or = 61 years of age: The upper reference limit for Creatinine is approximately 13% higher for people identified as African-American.      Creatinine, Ser  Date Value Ref Range Status  10/20/2021 0.67 0.57 - 1.00 mg/dL Final   Creatinine, POC  Date Value Ref Range Status  09/16/2016 100 mg/dL Final   Creatinine, Urine  Date Value Ref Range Status  07/23/2016 47 20 - 320 mg/dL Final         Passed - Valid encounter within last 12 months    Recent Outpatient Visits          1 month ago Type 2 diabetes mellitus with other specified complication, with long-term current use of insulin (HCC)   [facility] [place], [place], [place]   2 months ago Type 2 diabetes mellitus with other specified complication, with long-term current use of insulin [facility])   [facility] [place], [place], [place]   4 months ago Localized edema  [facility], Cari S, PA-C   5 months ago Type 2 diabetes mellitus with other specified complication, with long-term current use of insulin (HCC)   [facility] [place], [place], [place]   8 months ago Hyperlipidemia associated with type 2 diabetes mellitus [facility])   [facility] [name], MD      Future Appointments            In 1 week Shari Prows, Kathlynn Grate, MD [facility] [address] Office, [facility]   In 1 month [name], MD [facility]           . triamcinolone cream (KENALOG) 0.1 % [Pharmacy Med Name: triamcinolone acetonide 0.1 % topical cream] 45 g 1    Sig: APPLY TO THE AFFECTED AREA(S) ON BOTH LEGS TWICE DAILY     Not Delegated - Dermatology:  Corticosteroids Failed - 01/05/2022  8:03 AM      Failed - This refill cannot be delegated      Passed - Valid encounter within last 12 months     Recent Outpatient Visits          1 month ago Type 2 diabetes mellitus with other specified complication, with long-term current use of insulin (HCC)   [facility] [place], [place], [place]   2 months ago Type 2 diabetes mellitus with other specified complication, with long-term current use of insulin [facility])   [facility] [place], [place], [place]   4 months ago Localized edema   [facility] at [facility], Cari S, PA-C   5 months ago Type 2 diabetes mellitus with other specified complication, with long-term current use of insulin (HCC)   [facility] [place], [place], [place]   8 months ago Hyperlipidemia associated with type 2 diabetes mellitus [facility])   [facility] [name], MD      Future Appointments            In 1 week Pemberton, Kathlynn Grate, MD [facility] [address] Office, [facility]   In 1 month [name], MD [facility]

## 2022-01-13 ENCOUNTER — Other Ambulatory Visit: Payer: Self-pay | Admitting: Family Medicine

## 2022-01-13 DIAGNOSIS — I2510 Atherosclerotic heart disease of native coronary artery without angina pectoris: Secondary | ICD-10-CM

## 2022-01-13 DIAGNOSIS — I739 Peripheral vascular disease, unspecified: Secondary | ICD-10-CM

## 2022-01-13 DIAGNOSIS — L989 Disorder of the skin and subcutaneous tissue, unspecified: Secondary | ICD-10-CM

## 2022-01-13 DIAGNOSIS — Z794 Long term (current) use of insulin: Secondary | ICD-10-CM

## 2022-01-13 DIAGNOSIS — K219 Gastro-esophageal reflux disease without esophagitis: Secondary | ICD-10-CM

## 2022-01-13 DIAGNOSIS — E1169 Type 2 diabetes mellitus with other specified complication: Secondary | ICD-10-CM

## 2022-01-14 ENCOUNTER — Telehealth: Payer: Self-pay

## 2022-01-14 NOTE — Telephone Encounter (Signed)
Copied from CRM [id number]. Topic: General - Other >> Jan 14, 2022  3:15 PM Ja-Kwan M wrote: Reason for CRM: Pt returned call to the office. Attempted to transfer pt but there was no answer. Pt requests call back. Cb# [phone]

## 2022-01-14 NOTE — Telephone Encounter (Signed)
LVM to schedule AWV.

## 2022-01-14 NOTE — Progress Notes (Deleted)
Cardiology Office Note:    Date:  01/14/2022   ID:  Sarah Charles, DOB 08/04/1960, MRN [id number]  PCP:  [name], MD   [facility] Providers Cardiologist:  Meriam Sprague, MD {  Referring MD: [name], MD    History of Present Illness:    Sarah Charles is a 61 y.o. female with a hx of CAD, DMII, HLD, obesity, tobacco use, depression and GERD who was previously followed by Dr. Delton See who now returns to clinic for follow-up.  Per review of the record, the patient was  evaluated in the ED 10/2017 for chest pressure.  She underwent cardiac CTA showing significant mid LAD stenosis by FFR.  She was discharged and later came for outpatient cardiac catheterization 10/2017 revealing 75% stenosis in proximal LAD treated with DES.  Was last seen in clinic by Gillian Shields, NP for preop clearance prior to colonoscopy. She was asymptomatic at that time.  Today, ***   Past Medical History:  Diagnosis Date   Arthritis    Coronary artery calcification seen on CT scan    a. 07/2017 noted on CT abd.   Depression    GERD (gastroesophageal reflux disease)    Hyperlipidemia    Hypertension    Obesity    Sleep apnea    a. Does not use CPAP "cause i dont think I need it anymore".   Tobacco abuse    Type II diabetes mellitus (HCC)     Past Surgical History:  Procedure Laterality Date   CHOLECYSTECTOMY     CORONARY STENT INTERVENTION N/A 11/04/2017   Procedure: CORONARY STENT INTERVENTION;  Surgeon: Swaziland, Peter M, MD;  [facility] INVASIVE CV LAB;  Service: Cardiovascular;  Laterality: N/A;   INCISION AND DRAINAGE ABSCESS Left 12/22/2012   Procedure: INCISION AND DRAINAGE ABSCESS;  Surgeon: Dalia Heading, MD;  Location: [facility] ORS;  Service: General;  Laterality: Left;   INTRAVASCULAR PRESSURE WIRE/FFR STUDY N/A 11/04/2017   Procedure: INTRAVASCULAR PRESSURE WIRE/FFR STUDY;  Surgeon: Swaziland, Peter M, MD;  Location: [facility] INVASIVE CV LAB;  Service: Cardiovascular;  Laterality:  N/A;   LEFT HEART CATH AND CORONARY ANGIOGRAPHY N/A 11/04/2017   Procedure: LEFT HEART CATH AND CORONARY ANGIOGRAPHY;  Surgeon: Swaziland, Peter M, MD;  Location: [facility] INV[facility] CV LAB;  Service: Cardiovascular;  Laterality: N/A;   TONSILLECTOMY     TUBAL LIGATION      Current Medications: No outpatient medications have been marked as taking for the 01/15/22 encounter (Appointment) with Meriam Sprague, MD.     Allergies:   Cymbalta [duloxetine hcl]   Social History   Socioeconomic History   Marital status: Widowed    Spouse name: Not on file   Number of children: Not on file   Years of education: Not on file   Highest education level: Not on file  Occupational History   Not on file  Tobacco Use   Smoking status: Every Day    Packs/day: 1.00    Years: 41.00    Total pack years: 41.00    Types: Cigarettes    Last attempt to quit: 10/07/2018    Years since quitting: 3.2   Smokeless tobacco: Never  Vaping Use   Vaping Use: Never used  Substance and Sexual Activity   Alcohol use: No   Drug use: No   Sexual activity: Never    Birth control/protection: None  Other Topics Concern   Not on file  Social History Narrative   Lives in [place] with  husband.  Unemployed.  Does not routinely exercise.   Social Determinants of Health   Financial Resource Strain: Low Risk  (12/22/2020)   Overall Financial Resource Strain (CARDIA)    Difficulty of Paying Living Expenses: Not very hard  Food Insecurity: No Food Insecurity (12/22/2020)   Hunger Vital Sign    Worried About Running Out of Food in the Last Year: Never true    Ran Out of Food in the Last Year: Never true  Transportation Needs: No Transportation Needs (12/22/2020)   PRAPARE - Administrator, Civil Service (Medical): No    Lack of Transportation (Non-Medical): No  Physical Activity: Unknown (12/22/2020)   Exercise Vital Sign    Days of Exercise per Week: Not on file    Minutes of Exercise per Session: 0 min   Stress: No Stress Concern Present (12/22/2020)   [employer] of Occupational Health - Occupational Stress Questionnaire    Feeling of Stress : Only a little  Social Connections: Socially Isolated (12/22/2020)   Social Connection and Isolation Panel [NHANES]    Frequency of Communication with Friends and Family: More than three times a week    Frequency of Social Gatherings with Friends and Family: Once a week    Attends Religious Services: Never    Database administrator or Organizations: No    Attends Banker Meetings: Never    Marital Status: Widowed     Family History: The patient's ***family history includes Breast cancer in her mother; CAD in her father; COPD in her mother; CVA in her mother; Cancer in her maternal grandfather and maternal uncle; Diabetes in her maternal grandmother and sister; Heart attack in her father and mother; Heart disease in her mother; Hypercholesterolemia in her father; Hypertension in her mother; Kidney Stones in her mother. There is no history of Colon cancer, Esophageal cancer, Stomach cancer, or Pancreatic cancer.  ROS:   Please see the history of present illness.    *** All other systems reviewed and are negative.  EKGs/Labs/Other Studies Reviewed:    The following studies were reviewed today: Echo 10/2017 Study Conclusions   - Left ventricle: The cavity size was normal. Systolic function was    normal. The estimated ejection fraction was in the range of 55%    to 60%.   -------------------------------------------------------------------  Study data:  No prior study was available for comparison.  Study  status:  Routine.  Procedure:  The patient reported no pain pre or  post test. Transthoracic echocardiography. Image quality was fair.  Study completion:  There were no complications.  Transthoracic echocardiography.  M-mode, complete 2D, spectral  Doppler, and color Doppler.  Birthdate:  Patient birthdate:  [birth year].  Age:   Patient is 61 yr old.  Sex:  Gender: female.  BMI: 31.7 kg/m^2.  Blood pressure:     151/91  Patient status:  Inpatient.  Study date:  Study date: 10/30/2017. Study time: 11:39  AM.  Location:  Echo laboratory.   -------------------------------------------------------------------   -------------------------------------------------------------------  Left ventricle:  The cavity size was normal. Systolic function was  normal. The estimated ejection fraction was in the range of 55% to  60%.   -------------------------------------------------------------------  Aortic valve:   Structurally normal valve.   Cusp separation was  normal.  Doppler:  Transvalvular velocity was within the normal  range. There was no stenosis. There was no regurgitation.   -------------------------------------------------------------------  Mitral valve:   Structurally normal valve.   Leaflet separation was  normal.  Doppler:  Transvalvular velocity was within the normal  range. There was no evidence for stenosis. There was no  regurgitation.   -------------------------------------------------------------------  Left atrium:  The atrium was normal in size.   -------------------------------------------------------------------  Right ventricle:  The cavity size was normal. Wall thickness was  normal. Systolic function was normal.   -------------------------------------------------------------------  Tricuspid valve:   Structurally normal valve.   Leaflet separation  was normal.  Doppler:  Transvalvular velocity was within the normal  range. There was trivial regurgitation.   -------------------------------------------------------------------  Right atrium:  The atrium was normal in size.   -------------------------------------------------------------------  Pericardium: There was no pericardial effusion.  LHC 10/2017 Prox LAD lesion is 70% stenosed. Prox Cx to Mid Cx lesion is 30% stenosed. Prox RCA to  Mid RCA lesion is 20% stenosed. Post intervention, there is a 0% residual stenosis. A drug-eluting stent was successfully placed using a STENT SIERRA 2.50 X 15 MM. LV end diastolic pressure is normal.   1. Single vessel obstructive CAD.    - 70% mid LAD immediately after the first diagonal. FFR 0.78 2. Normal LVEDP 3. Successful PCI of the mid LAD with DES   Plan: DAPT for at least 6 months. She is a candidate for same day discharge. Will hold metformin for 48 hours. Will need to switch Prilosec to Protonix.   EKG:  EKG is *** ordered today.  The ekg ordered today demonstrates ***  Recent Labs: 08/28/2021: TSH 1.977 10/19/2021: ALT 20; B Natriuretic Peptide 11.7; Hemoglobin 12.1; Platelets 257 10/20/2021: BUN 23; Creatinine, Ser 0.67; Potassium 4.2; Sodium 142  Recent Lipid Panel    Component Value Date/Time   CHOL 137 11/12/2021 1018   TRIG 102 11/12/2021 1018   HDL 54 11/12/2021 1018   CHOLHDL 3.9 08/14/2020 1124   CHOLHDL 9.7 (H) 07/23/2016 1638   VLDL NOT CALC 07/23/2016 1638   LDLCALC 64 11/12/2021 1018     Risk Assessment/Calculations:   {Does this patient have ATRIAL FIBRILLATION?:[phone]}  No BP recorded.  {Refresh Note OR Click here to enter BP  :1}***         Physical Exam:    VS:  There were no vitals taken for this visit.    Wt Readings from Last 3 Encounters:  11/12/21 191 lb (86.6 kg)  10/09/21 198 lb 12.8 oz (90.2 kg)  10/08/21 198 lb 6.4 oz (90 kg)     GEN: *** Well nourished, well developed in no acute distress HEENT: Normal NECK: No JVD; No carotid bruits LYMPHATICS: No lymphadenopathy CARDIAC: ***RRR, no murmurs, rubs, gallops RESPIRATORY:  Clear to auscultation without rales, wheezing or rhonchi  ABDOMEN: Soft, non-tender, non-distended MUSCULOSKELETAL:  No edema; No deformity  SKIN: Warm and dry NEUROLOGIC:  Alert and oriented x 3 PSYCHIATRIC:  Normal affect   ASSESSMENT:    No diagnosis found. PLAN:    In order of problems listed  above:  #CAD with PCI to LAD: S/p PCI to mid LAD in 06/2017. Mild residual disease in Lcx and RCA. Currently doing well without anginal symptoms.  -Continue ASA 81mg  daily, crestor 40mg  daily -Continue praluent  -Continue metop 25mg  BID -Continue lisinopril 20mg  daily  #HTN: -Continue amlodipine 10mg  daily -Continue metop 25mg  BID -Continue lisinopril 20mg  daily  #HLD: -Continue crestor 40mg  daily -Continue praluent   #Tobacco Use: -Smoking cessation encouarged  #DMII: -Followed by PCP      {Are you ordering a CV Procedure (e.g. stress test, cath, DCCV, TEE, etc)?   Press  F2        :[id number]}    Medication Adjustments/Labs and Tests Ordered: Current medicines are reviewed at length with the patient today.  Concerns regarding medicines are outlined above.  No orders of the defined types were placed in this encounter.  No orders of the defined types were placed in this encounter.   There are no Patient Instructions on file for this visit.   Signed, Meriam Sprague, MD  01/14/2022 8:09 PM    [facility]

## 2022-01-15 ENCOUNTER — Ambulatory Visit: Payer: Medicare (Managed Care) | Admitting: Cardiology

## 2022-01-21 NOTE — Telephone Encounter (Signed)
Call placed to patient and VM was left informing her to contact office.

## 2022-01-22 ENCOUNTER — Ambulatory Visit: Payer: Medicare (Managed Care) | Attending: Family Medicine

## 2022-01-22 DIAGNOSIS — E2839 Other primary ovarian failure: Secondary | ICD-10-CM

## 2022-01-22 DIAGNOSIS — Z0000 Encounter for general adult medical examination without abnormal findings: Secondary | ICD-10-CM

## 2022-01-22 DIAGNOSIS — Z1211 Encounter for screening for malignant neoplasm of colon: Secondary | ICD-10-CM

## 2022-01-22 NOTE — Progress Notes (Signed)
Subjective:   Sarah Charles is a 61 y.o. female who presents for Medicare Annual (Subsequent) preventive examination.  Review of Systems     I connected with Sarah Charles on 01/22/2022 at 2:44pm by telephone and verified that I am speaking with the correct person using two identifiers. I discussed the limitations, risks, security and privacy concerns of performing an evaluation and management service by telephone and the availability of in person appointments. I also discussed with the patient that there may be a patient responsible charge related to this service. The patient expressed understanding and agreed to proceed.   Patient location: Home  My Location: [place] on the telephone call: Myself and Patient      Cardiac Risk Factors include: none     Objective:    Today's Vitals   01/22/22 1444  PainSc: 7    There is no height or weight on file to calculate BMI.     01/22/2022    2:46 PM 11/26/2020    8:26 PM 11/13/2020   12:27 PM 10/15/2020    3:04 PM 12/10/2019    5:40 PM 07/18/2018   10:28 PM 03/15/2018    9:22 AM  Advanced Directives  Does Patient Have a Medical Advance Directive? _0  No No  Would patient like information on creating a medical advance directive? Yes (ED - Information included in AVS) No - Patient declined  No - Patient declined No - Patient declined No - Patient declined No - Patient declined    Current Medications (verified) Outpatient Encounter Medications as of 01/22/2022  Medication Sig   Accu-Chek Softclix Lancets lancets Use as instructed tid before meals   amLODipine (NORVASC) 10 MG tablet TAKE ONE TABLET BY MOUTH ONCE DAILY   aspirin EC 81 MG tablet Take 81 mg by mouth daily.   baclofen (LIORESAL) 10 MG tablet Take one tab PO TID PRN pain/ spasm   Blood Glucose Monitoring Suppl (ACCU-CHEK GUIDE ME) w/Device KIT 1 each by Does not apply route 3 (three) times daily.   Blood Glucose Monitoring Suppl (ACCU-CHEK  GUIDE) w/Device KIT Use as directed   buPROPion (WELLBUTRIN SR) 150 MG 12 hr tablet TAKE ONE TABLET BY MOUTH TWICE DAILY   cetirizine (ZYRTEC) 10 MG tablet TAKE 1 TABLET BY MOUTH EVERY DAY   clotrimazole (LOTRIMIN) 1 % cream Apply 1 Application topically 2 (two) times daily. Apply beneath both breasts   clotrimazole-betamethasone (LOTRISONE) cream Apply 1 application topically 2 (two) times daily.   diclofenac Sodium (VOLTAREN) 1 % GEL Apply 4 g topically 4 (four) times daily.   dicyclomine (BENTYL) 10 MG capsule Take 1 capsule by mouth daily.   fenofibrate (TRICOR) 145 MG tablet Take 1 tablet (145 mg total) by mouth daily.   FLUoxetine (PROZAC) 20 MG capsule TAKE ONE CAPSULE BY MOUTH EVERY MORNING   furosemide (LASIX) 20 MG tablet Take 1 tablet (20 mg total) by mouth daily.   gabapentin (NEURONTIN) 300 MG capsule TAKE TWO CAPSULES BY MOUTH TWICE DAILY   glucose blood (ACCU-CHEK GUIDE) test strip Use as instructed tid   glucose blood (ACCU-CHEK GUIDE) test strip USE TO TEST BLOOD SUGAR 2 TIMES A DAY   insulin glargine (LANTUS SOLOSTAR) 100 UNIT/ML Solostar Pen INJECT 50 UNITS into THE SKIN AT BEDTIME   Insulin Pen Needle (COMFORT EZ PEN NEEDLES) 31G X 5 MM MISC USE AS DIRECTED ONCE DAILY   Lancets Misc. (ACCU-CHEK FASTCLIX LANCET) KIT USE AS DIRECTED  liraglutide (VICTOZA) 18 MG/3ML SOPN INJECT 1.43m SUBCUTANEOUSLY ONCE A DAY   lisinopril (ZESTRIL) 20 MG tablet TAKE ONE TABLET BY MOUTH ONCE DAILY   metFORMIN (GLUCOPHAGE) 500 MG tablet Take 2 tablets (1,000 mg total) by mouth 2 (two) times daily with a meal.   metoCLOPramide (REGLAN) 10 MG tablet TAKE 1 TABLET BY MOUTH EVERY EIGHT HOURS AS NEEDED FOR NAUSEA OR VOMITING.   metoprolol tartrate (LOPRESSOR) 25 MG tablet TAKE ONE TABLET BY MOUTH TWICE DAILY   Multiple Vitamin (MULTIVITAMIN) tablet Take 1 tablet by mouth daily.   mupirocin ointment (BACTROBAN) 2 % APPLY TO THE AFFECTED AREA(S) twice daily   naproxen (NAPROSYN) 375 MG tablet Take 1  tablet (375 mg total) by mouth 2 (two) times daily with a meal.   nitroGLYCERIN (NITROSTAT) 0.4 MG SL tablet Place 0.4 mg under the tongue every 5 (five) minutes as needed for chest pain.   pantoprazole (PROTONIX) 40 MG tablet TAKE ONE TABLET BY MOUTH ONCE DAILY   potassium chloride (KLOR-CON) 10 MEQ tablet 1 tab every other day When you take furosemide   PRALUENT 75 MG/ML SOAJ INJECT 75 MG into THE SKIN every 14 DAYS   rosuvastatin (CRESTOR) 40 MG tablet TAKE ONE TABLET BY MOUTH EVERY EVENING   terbinafine (LAMISIL) 250 MG tablet Please take one a day x 7days, repeat every 4 weeks x 4 months   traZODone (DESYREL) 50 MG tablet TAKE ONE TABLET BY MOUTH EVERYDAY AT BEDTIME   triamcinolone cream (KENALOG) 0.1 % APPLY TO THE AFFECTED AREA(S) ON BOTH LEGS TWICE DAILY   vitamin B-12 (CYANOCOBALAMIN) 500 MCG tablet TAKE ONE TABLET BY MOUTH ONCE DAILY   vitamin C (ASCORBIC ACID) 500 MG tablet Take 500 mg by mouth daily.   No facility-administered encounter medications on file as of 01/22/2022.    Allergies (verified) Cymbalta [duloxetine hcl]   History: Past Medical History:  Diagnosis Date   Arthritis    Coronary artery calcification seen on CT scan    a. 07/2017 noted on CT abd.   Depression    GERD (gastroesophageal reflux disease)    Hyperlipidemia    Hypertension    Obesity    Sleep apnea    a. Does not use CPAP "cause i dont think I need it anymore".   Tobacco abuse    Type II diabetes mellitus ([place]    Past Surgical History:  Procedure Laterality Date   CHOLECYSTECTOMY     CORONARY STENT INTERVENTION N/A 11/04/2017   Procedure: CORONARY STENT INTERVENTION;  Surgeon: JMartinique Peter M, MD;  [facility];  Service: Cardiovascular;  Laterality: N/A;   INCISION AND DRAINAGE ABSCESS Left 12/22/2012   Procedure: INCISION AND DRAINAGE ABSCESS;  Surgeon: MJamesetta So MD;  Location: [facility];  Service: General;  Laterality: Left;   INTRAVASCULAR PRESSURE WIRE/FFR STUDY N/A  11/04/2017   Procedure: INTRAVASCULAR PRESSURE WIRE/FFR STUDY;  Surgeon: JMartinique Peter M, MD;  [facility];  Service: Cardiovascular;  Laterality: N/A;   LEFT HEART CATH AND CORONARY ANGIOGRAPHY N/A 11/04/2017   Procedure: LEFT HEART CATH AND CORONARY ANGIOGRAPHY;  Surgeon: JMartinique Peter M, MD;  [facility];  Service: Cardiovascular;  Laterality: N/A;   TONSILLECTOMY     TUBAL LIGATION     Family History  Problem Relation Age of Onset   Hypertension Mother    CVA Mother    COPD Mother    Heart disease Mother    Kidney Stones Mother  Heart attack Mother        MI in late 70's   Breast cancer Mother    CAD Father    Hypercholesterolemia Father    Heart attack Father        Died @ 80 of MI   Diabetes Sister    Cancer Maternal Uncle    Diabetes Maternal Grandmother    Cancer Maternal Grandfather        lung cancer   Colon cancer Neg Hx    Esophageal cancer Neg Hx    Stomach cancer Neg Hx    Pancreatic cancer Neg Hx    Social History   Socioeconomic History   Marital status: Widowed    Spouse name: Not on file   Number of children: Not on file   Years of education: Not on file   Highest education level: Not on file  Occupational History   Not on file  Tobacco Use   Smoking status: Every Day    Packs/day: 1.00    Years: 41.00    Total pack years: 41.00    Types: Cigarettes    Last attempt to quit: 10/07/2018    Years since quitting: 3.2   Smokeless tobacco: Never  Vaping Use   Vaping Use: Never used  Substance and Sexual Activity   Alcohol use: No   Drug use: No   Sexual activity: Never    Birth control/protection: None  Other Topics Concern   Not on file  Social History Narrative   Lives in [place] with husband.  Unemployed.  Does not routinely exercise.   Social Determinants of Health   Financial Resource Strain: Low Risk  (12/22/2020)   Overall Financial Resource Strain (CARDIA)    Difficulty of Paying Living Expenses:  Not very hard  Food Insecurity: No Food Insecurity (12/22/2020)   Hunger Vital Sign    Worried About Running Out of Food in the Last Year: Never true    Ran Out of Food in the Last Year: Never true  Transportation Needs: No Transportation Needs (12/22/2020)   PRAPARE - Hydrologist (Medical): No    Lack of Transportation (Non-Medical): No  Physical Activity: Unknown (12/22/2020)   Exercise Vital Sign    Days of Exercise per Week: Not on file    Minutes of Exercise per Session: 0 min  Stress: No Stress Concern Present (12/22/2020)   [place]    Feeling of Stress : Only a little  Social Connections: Socially Isolated (12/22/2020)   Social Connection and Isolation Panel [NHANES]    Frequency of Communication with Friends and Family: More than three times a week    Frequency of Social Gatherings with Friends and Family: Once a week    Attends Religious Services: Never    Marine scientist or Organizations: No    Attends Archivist Meetings: Never    Marital Status: Widowed    Tobacco Counseling Ready to quit: No Counseling given: Yes   Clinical Intake:  Pre-visit preparation completed: No  Pain : 0-10 Pain Score: 7  Pain Type: Chronic pain Pain Location: Back Pain Orientation: Lower Pain Descriptors / Indicators: Aching Pain Onset: More than a month ago Pain Frequency: Occasional     Nutritional Risks: None Diabetes: Yes CBG done?: No  How often do you need to have someone help you when you read instructions, pamphlets, or other written materials from your doctor or pharmacy?:  1 - Never  Diabetic?Yes  Interpreter Needed?: No      Activities of Daily Living    01/22/2022    2:47 PM  In your present state of health, do you have any difficulty performing the following activities:  Hearing? 0  Vision? 0  Difficulty concentrating or making decisions? 0  Walking  or climbing stairs? 1  Dressing or bathing? 0  Doing errands, shopping? 0  Preparing Food and eating ? N  Using the Toilet? N  In the past six months, have you accidently leaked urine? Y  Do you have problems with loss of bowel control? N  Managing your Medications? N  Managing your Finances? N  Housekeeping or managing your Housekeeping? N    Patient Care Team: Charlott Rakes, MD as PCP - General (Family Medicine) Freada Bergeron, MD as PCP - Cardiology (Cardiology)  Indicate any recent Medical Services you may have received from other than [facility] providers in the past year (date may be approximate).     Assessment:   This is a routine wellness examination for [place].  Hearing/Vision screen No results found.  Dietary issues and exercise activities discussed: Current Exercise Habits: Home exercise routine, Type of exercise: walking, Time (Minutes): 60, Frequency (Times/Week): 7, Weekly Exercise (Minutes/Week): 420, Intensity: Mild, Exercise limited by: None identified   Goals Addressed   None    Depression Screen    01/22/2022    2:46 PM 10/08/2021    9:20 AM 08/14/2020   11:03 AM 12/31/2019    2:21 PM 08/20/2019   11:07 AM 04/03/2019    2:52 PM 08/15/2017    3:22 PM  PHQ 2/9 Scores  PHQ - 2 Score 0  0  3 0 1  PHQ- 9 Score   0  10 0 7  Exception Documentation  Patient refusal  Patient refusal       Fall Risk    01/22/2022    2:46 PM 11/12/2021    9:17 AM 10/08/2021    9:20 AM 08/31/2021    9:16 AM 08/04/2021    3:26 PM  [place] in the past year? 0 0 0 0 0  Number falls in past yr: 0 0  0 0  Injury with Fall? 0 0  0 0  Risk for fall due to : No Fall Risks      Follow up    Falls evaluation completed     FALL RISK PREVENTION PERTAINING TO THE HOME:  Any stairs in or around the home? Yes  If so, are there any without handrails? No  Home free of loose throw rugs in walkways, pet beds, electrical cords, etc? Yes  Adequate lighting in your home to reduce  risk of falls? Yes   ASSISTIVE DEVICES UTILIZED TO PREVENT FALLS:  Life alert? No  Use of a cane, walker or w/c? No  Grab bars in the bathroom? No  Shower chair or bench in shower? No  Elevated toilet seat or a handicapped toilet? No   TIMED UP AND GO:  Was the test performed? No .  Length of time to ambulate 10 feet: 0  sec.   Gait slow and steady without use of assistive device  Cognitive Function:    01/22/2022    2:48 PM  MMSE - Mini Mental State Exam  Orientation to time 5  Orientation to Place 5  Registration 3  Attention/ Calculation 5  Recall 3  Language- name 2 objects 2  Language- repeat 1  Language- follow 3 step command 3  Language- read & follow direction 1  Write a sentence 1  Copy design 1  Total score 30        01/22/2022    2:49 PM  6CIT Screen  What Year? 0 points  What month? 0 points  What time? 0 points  Count back from 20 0 points  Months in reverse 0 points  Repeat phrase 0 points  Total Score 0 points    Immunizations Immunization History  Administered Date(s) Administered   Influenza Split 02/13/2015   Influenza,inj,Quad PF,6+ Mos 02/15/2018   Influenza-Unspecified 02/24/2017, 04/23/2021   PFIZER(Purple Top)SARS-COV-2 Vaccination 09/14/2019, 10/11/2019, 05/27/2020   Pfizer Covid-19 Vaccine Bivalent Booster 58yrs & up 03/18/2021   Pneumococcal Conjugate-13 08/16/2016   Pneumococcal Polysaccharide-23 09/16/2016   Td 02/21/2001   Tdap 08/16/2016   Zoster Recombinat (Shingrix) 08/20/2019, 08/31/2021    TDAP status: Up to date  Flu Vaccine status: Due, Education has been provided regarding the importance of this vaccine. Advised may receive this vaccine at local pharmacy or Health Dept. Aware to provide a copy of the vaccination record if obtained from local pharmacy or Health Dept. Verbalized acceptance and understanding.  Pneumococcal vaccine status: Up to date  Covid-19 vaccine status: Completed vaccines  Qualifies for  Shingles Vaccine? Yes   Zostavax completed Yes   Shingrix Completed?: Yes  Screening Tests Health Maintenance  Topic Date Due   COLONOSCOPY (Pts 45-31yrs Insurance coverage will need to be confirmed)  Never done   INFLUENZA VACCINE  12/22/2021   FOOT EXAM  12/24/2021   HEMOGLOBIN A1C  05/14/2022   OPHTHALMOLOGY EXAM  06/29/2022   MAMMOGRAM  06/25/2023   PAP SMEAR-Modifier  12/25/2023   TETANUS/TDAP  08/17/2026   COVID-19 Vaccine  Completed   Hepatitis C Screening  Completed   HIV Screening  Completed   Zoster Vaccines- Shingrix  Completed   HPV VACCINES  Aged Out    Health Maintenance  Health Maintenance Due  Topic Date Due   COLONOSCOPY (Pts 45-22yrs Insurance coverage will need to be confirmed)  Never done   INFLUENZA VACCINE  12/22/2021   FOOT EXAM  12/24/2021    Colorectal cancer screening: Referral to GI placed 01/22/2022. Pt aware the office will call re: appt.  Mammogram status: Completed 06/24/2021. Repeat every year  Bone Density status: Ordered 01/22/2022. Pt provided with contact info and advised to call to schedule appt.  Lung Cancer Screening: (Low Dose CT Chest recommended if Age 56-80 years, 30 pack-year currently smoking OR have quit w/in 15years.) does qualify.   Lung Cancer Screening Referral: ordered on 08/04/2021 not done yet  Additional Screening:  Hepatitis C Screening: does qualify; Completed 09/16/2016  Vision Screening: Recommended annual ophthalmology exams for early detection of glaucoma and other disorders of the eye. Is the patient up to date with their annual eye exam?  Yes  Who is the provider or what is the name of the office in which the patient attends annual eye exams? My eye doctor If pt is not established with a provider, would they like to be referred to a provider to establish care? No .   Dental Screening: Recommended annual dental exams for proper oral hygiene  Community Resource Referral / Chronic Care Management: CRR required  this visit?  No   CCM required this visit?  No      Plan:     I have personally reviewed and noted the following in the  patient's chart:   Medical and social history Use of alcohol, tobacco or illicit drugs  Current medications and supplements including opioid prescriptions. Patient is not currently taking opioid prescriptions. Functional ability and status Nutritional status Physical activity Advanced directives List of other physicians Hospitalizations, surgeries, and ER visits in previous 12 months Vitals Screenings to include cognitive, depression, and falls Referrals and appointments  In addition, I have reviewed and discussed with patient certain preventive protocols, quality metrics, and best practice recommendations. A written personalized care plan for preventive services as well as general preventive health recommendations were provided to patient.     Gomez Cleverly, [place]   01/22/2022   Nurse Notes: I spent 30 minutes on this telephone encounter  AVS mailed to patient

## 2022-01-22 NOTE — Patient Instructions (Signed)
[name] , Thank you for taking time to come for your Medicare Wellness Visit. I appreciate your ongoing commitment to your health goals. Please review the following plan we discussed and let me know if I can assist you in the future.   These are the goals we discussed:  Goals      HEMOGLOBIN A1C < 7     Manage My Medicine     Timeframe:  Long-Range Goal Priority:  High Start Date:                             Expected End Date:                       Follow Up Date Monthly   - call for medicine refill 2 or 3 days before it runs out    Why is this important?   These steps will help you keep on track with your medicines.   Notes: Patient paid on 1st and 3rd of month. Can't afford meds if refilled later in the month. Verbalized consent for [facility] to get all meds delivered on 3rd of month  Verbal consent obtained for [facility] enhanced pharmacy services (medication synchronization, adherence packaging, delivery coordination). A medication sync plan was created to allow patient to get all medications delivered once every 30 to 90 days per patient preference. Patient understands they have freedom to choose pharmacy and clinical pharmacist will coordinate care between all prescribers and [facility].         This is a list of the screening recommended for you and due dates:  Health Maintenance  Topic Date Due   Colon Cancer Screening  Never done   Flu Shot  12/22/2021   Complete foot exam   12/24/2021   Hemoglobin A1C  05/14/2022   Eye exam for diabetics  06/29/2022   Mammogram  06/25/2023   Pap Smear  12/25/2023   Tetanus Vaccine  08/17/2026   COVID-19 Vaccine  Completed   Hepatitis C Screening: USPSTF Recommendation to screen - Ages 18-79 yo.  Completed   HIV Screening  Completed   Zoster (Shingles) Vaccine  Completed   HPV Vaccine  Aged Out  Health Maintenance, Female Adopting a healthy lifestyle and getting preventive care are important in promoting health  and wellness. Ask your health care provider about: The right schedule for you to have regular tests and exams. Things you can do on your own to prevent diseases and keep yourself healthy. What should I know about diet, weight, and exercise? Eat a healthy diet  Eat a diet that includes plenty of vegetables, fruits, low-fat dairy products, and lean protein. Do not eat a lot of foods that are high in solid fats, added sugars, or sodium. Maintain a healthy weight Body mass index (BMI) is used to identify weight problems. It estimates body fat based on height and weight. Your health care provider can help determine your BMI and help you achieve or maintain a healthy weight. Get regular exercise Get regular exercise. This is one of the most important things you can do for your health. Most adults should: Exercise for at least 150 minutes each week. The exercise should increase your heart rate and make you sweat (moderate-intensity exercise). Do strengthening exercises at least twice a week. This is in addition to the moderate-intensity exercise. Spend less time sitting. Even light physical activity can be beneficial. Watch cholesterol and  blood lipids Have your blood tested for lipids and cholesterol at 61 years of age, then have this test every 5 years. Have your cholesterol levels checked more often if: Your lipid or cholesterol levels are high. You are older than 61 years of age. You are at high risk for heart disease. What should I know about cancer screening? Depending on your health history and family history, you may need to have cancer screening at various ages. This may include screening for: Breast cancer. Cervical cancer. Colorectal cancer. Skin cancer. Lung cancer. What should I know about heart disease, diabetes, and high blood pressure? Blood pressure and heart disease High blood pressure causes heart disease and increases the risk of stroke. This is more likely to develop in  people who have high blood pressure readings or are overweight. Have your blood pressure checked: Every 3-5 years if you are 54-8 years of age. Every year if you are 35 years old or older. Diabetes Have regular diabetes screenings. This checks your fasting blood sugar level. Have the screening done: Once every three years after age 64 if you are at a normal weight and have a low risk for diabetes. More often and at a younger age if you are overweight or have a high risk for diabetes. What should I know about preventing infection? Hepatitis B If you have a higher risk for hepatitis B, you should be screened for this virus. Talk with your health care provider to find out if you are at risk for hepatitis B infection. Hepatitis C Testing is recommended for: Everyone born from [birth year] through 1965. Anyone with known risk factors for hepatitis C. Sexually transmitted infections (STIs) Get screened for STIs, including gonorrhea and chlamydia, if: You are sexually active and are younger than 61 years of age. You are older than 61 years of age and your health care provider tells you that you are at risk for this type of infection. Your sexual activity has changed since you were last screened, and you are at increased risk for chlamydia or gonorrhea. Ask your health care provider if you are at risk. Ask your health care provider about whether you are at high risk for HIV. Your health care provider may recommend a prescription medicine to help prevent HIV infection. If you choose to take medicine to prevent HIV, you should first get tested for HIV. You should then be tested every 3 months for as long as you are taking the medicine. Pregnancy If you are about to stop having your period (premenopausal) and you may become pregnant, seek counseling before you get pregnant. Take 400 to 800 micrograms (mcg) of folic acid every day if you become pregnant. Ask for birth control (contraception) if you want to  prevent pregnancy. Osteoporosis and menopause Osteoporosis is a disease in which the bones lose minerals and strength with aging. This can result in bone fractures. If you are 41 years old or older, or if you are at risk for osteoporosis and fractures, ask your health care provider if you should: Be screened for bone loss. Take a calcium or vitamin D supplement to lower your risk of fractures. Be given hormone replacement therapy (HRT) to treat symptoms of menopause. Follow these instructions at home: Alcohol use Do not drink alcohol if: Your health care provider tells you not to drink. You are pregnant, may be pregnant, or are planning to become pregnant. If you drink alcohol: Limit how much you have to: 0-1 drink a day. Know how  much alcohol is in your drink. In the [place] one drink equals one 12 oz bottle of beer (355 mL), one 5 oz glass of wine (148 mL), or one 1 oz glass of hard liquor (44 mL). Lifestyle Do not use any products that contain nicotine or tobacco. These products include cigarettes, chewing tobacco, and vaping devices, such as e-cigarettes. If you need help quitting, ask your health care provider. Do not use street drugs. Do not share needles. Ask your health care provider for help if you need support or information about quitting drugs. General instructions Schedule regular health, dental, and eye exams. Stay current with your vaccines. Tell your health care provider if: You often feel depressed. You have ever been abused or do not feel safe at home. Summary Adopting a healthy lifestyle and getting preventive care are important in promoting health and wellness. Follow your health care provider's instructions about healthy diet, exercising, and getting tested or screened for diseases. Follow your health care provider's instructions on monitoring your cholesterol and blood pressure. This information is not intended to replace advice given to you by your health care  provider. Make sure you discuss any questions you have with your health care provider. Document Revised: 09/29/2020 Document Reviewed: 09/29/2020 Elsevier Patient Education  [employer].

## 2022-02-03 ENCOUNTER — Ambulatory Visit: Payer: Medicare (Managed Care) | Admitting: Podiatry

## 2022-02-04 ENCOUNTER — Other Ambulatory Visit: Payer: Self-pay | Admitting: Family Medicine

## 2022-02-04 DIAGNOSIS — Z794 Long term (current) use of insulin: Secondary | ICD-10-CM

## 2022-02-09 ENCOUNTER — Telehealth: Payer: Self-pay | Admitting: Family Medicine

## 2022-02-09 ENCOUNTER — Other Ambulatory Visit: Payer: Self-pay

## 2022-02-09 MED ORDER — ONETOUCH VERIO VI STRP
ORAL_STRIP | 12 refills | Status: DC
Start: 2022-02-09 — End: 2023-06-22

## 2022-02-09 MED ORDER — ONETOUCH VERIO W/DEVICE KIT: PACK | 0 refills | Status: DC

## 2022-02-09 MED ORDER — ONETOUCH DELICA PLUS LANCET33G MISC
12 refills | Status: DC
Start: 2022-02-09 — End: 2023-06-22

## 2022-02-09 NOTE — Telephone Encounter (Signed)
Please call pt back as may be confused. She was staying with a friend in the hospital and she lost her Blood Monitor machine and all supplies. She states that everything was One Touch as her insurance does not cover anything but that. However everything lancets, monitor, on Epic everything is Accu Ck.  She needs meter and all supplies. Please confirm which meter as she stated that she did not have Accu ck. Please call back to confirm as she wants insurance to pay for everything. [phone] She only wants what insurance will cover.

## 2022-02-09 NOTE — Telephone Encounter (Signed)
One touch device has been sent to pharmacy.

## 2022-02-10 ENCOUNTER — Ambulatory Visit
Admission: RE | Admit: 2022-02-10 | Discharge: 2022-02-10 | Disposition: A | Discharge status: Home / Self Care | Payer: Medicare (Managed Care) | Source: Ambulatory Visit | Attending: Family Medicine | Admitting: Family Medicine

## 2022-02-10 DIAGNOSIS — E2839 Other primary ovarian failure: Secondary | ICD-10-CM

## 2022-02-12 ENCOUNTER — Other Ambulatory Visit: Payer: Self-pay | Admitting: *Deleted

## 2022-02-12 ENCOUNTER — Telehealth: Payer: Self-pay

## 2022-02-12 MED ORDER — DICYCLOMINE HCL 10 MG PO CAPS: 10.0000 mg | ORAL_CAPSULE | Freq: Every day | ORAL | 1 refills | Status: DC

## 2022-02-12 MED ORDER — LIDOCAINE 5 % EX PTCH: 1.0000 | MEDICATED_PATCH | CUTANEOUS | 1 refills | Status: DC

## 2022-02-12 NOTE — Telephone Encounter (Signed)
I have sent a prescription for Lidoderm patches to her pharmacy.  I will need to make a substitution to one of her medications for adequate pain control.  This can be addressed at her upcoming visit.

## 2022-02-12 NOTE — Telephone Encounter (Signed)
Pt asking about muscle relaxer and if something stronger can be sent in since she has to take 3 of the baclofen to even help with the pain.

## 2022-02-12 NOTE — Telephone Encounter (Signed)
Pt called again because she has not heard back from her call this morning. She is waiting to hear about some pain medication. We reviewed her meds to make sure she is taking correctly. She stated she only takes one Gabapentin twice a day. I called her pharm that fills her meds in daily doses. They stated she gets 4 Gabapentin a day two in the morning and two in the evening. Pt wanting Bentyl.

## 2022-02-12 NOTE — Telephone Encounter (Signed)
Requested medication (s) are due for refill today: yes  Requested medication (s) are on the active medication list: yes  Last refill:  05/26/2018 (historical provider so no details)  Future visit scheduled: 02/25/22, seen 11/12/21  Notes to clinic:  Historical Provider, please assess.       Requested Prescriptions  Pending Prescriptions Disp Refills   dicyclomine (BENTYL) 10 MG capsule      Sig: Take 1 capsule (10 mg total) by mouth daily.     Gastroenterology:  Antispasmodic Agents Passed - 02/12/2022  1:19 PM      Passed - Valid encounter within last 12 months    Recent Outpatient Visits           3 months ago Type 2 diabetes mellitus with other specified complication, with long-term current use of insulin ([place])   [place], [place], MD   4 months ago Type 2 diabetes mellitus with other specified complication, with long-term current use of insulin [facility])   [place], [place], [place]   5 months ago Localized edema   Primary Care at [facility], Cari S, PA-C   6 months ago Type 2 diabetes mellitus with other specified complication, with long-term current use of insulin ([place])   [place], [place], MD   9 months ago Hyperlipidemia associated with type 2 diabetes mellitus [facility])   [place], [place], MD       Future Appointments             In 1 week Johney Frame, Greer Ee, MD [place]. [facility], [facility]   In 1 week Charlott Rakes, MD [place]

## 2022-02-13 ENCOUNTER — Other Ambulatory Visit: Payer: Self-pay | Admitting: Family Medicine

## 2022-02-13 DIAGNOSIS — I152 Hypertension secondary to endocrine disorders: Secondary | ICD-10-CM

## 2022-02-13 DIAGNOSIS — F32A Depression, unspecified: Secondary | ICD-10-CM

## 2022-02-15 NOTE — Telephone Encounter (Signed)
Requested Prescriptions  Pending Prescriptions Disp Refills  . metoprolol tartrate (LOPRESSOR) 25 MG tablet [Pharmacy Med Name: metoprolol tartrate 25 mg tablet] 180 tablet 0    Sig: TAKE ONE TABLET BY MOUTH TWICE DAILY     Cardiovascular:  Beta Blockers Passed - 02/13/2022  1:57 PM      Passed - Last BP in normal range    BP Readings from Last 1 Encounters:  11/12/21 139/81         Passed - Last Heart Rate in normal range    Pulse Readings from Last 1 Encounters:  11/12/21 65         Passed - Valid encounter within last 6 months    Recent Outpatient Visits          3 months ago Type 2 diabetes mellitus with other specified complication, with long-term current use of insulin (HCC)   [place] [facility] [place], [place], [place]   4 months ago Type 2 diabetes mellitus with other specified complication, with long-term current use of insulin [facility])   [place] [facility] [place], [place], [place]   5 months ago Localized edema   Primary Care at [facility], Cari S, PA-C   6 months ago Type 2 diabetes mellitus with other specified complication, with long-term current use of insulin (HCC)   [place] [facility] [place], [place], MD   9 months ago Hyperlipidemia associated with type 2 diabetes mellitus [facility])   [place] [facility] [name], MD      Future Appointments            In 1 week Shari Prows, Kathlynn [place]ate, MD [facility] [address] A Dept Of [place]. [facility], [facility]   In 1 week [name], MD [facility]           . FLUoxetine (PROZAC) 20 MG capsule [Pharmacy Med Name: fluoxetine 20 mg capsule] 90 capsule 0    Sig: TAKE ONE CAPSULE BY MOUTH EVERY MORNING     Psychiatry:  Antidepressants - SSRI Passed - 02/13/2022  1:57 PM      Passed - Completed PHQ-2 or PHQ-9 in the last 360 days      Passed - Valid encounter within last  6 months    Recent Outpatient Visits          3 months ago Type 2 diabetes mellitus with other specified complication, with long-term current use of insulin (HCC)   [place] [facility] [place], [place], [place]   4 months ago Type 2 diabetes mellitus with other specified complication, with long-term current use of insulin [facility])   [place] [facility] [place], [place], [place]   5 months ago Localized edema   Primary Care at [facility], Cari S, PA-C   6 months ago Type 2 diabetes mellitus with other specified complication, with long-term current use of insulin (HCC)   [place] [facility] [place], [place], MD   9 months ago Hyperlipidemia associated with type 2 diabetes mellitus [facility])   [place] [facility] [name], MD      Future Appointments            In 1 week Shari Prows, Kathlynn Grate, MD [facility] [address] A Dept Of [place]. [facility], [facility]   In 1 week [name], MD [facility]  [place]           . buPROPion (WELLBUTRIN SR) 150 MG 12 hr tablet [Pharmacy Med Name: bupropion HCl SR 150 mg tablet,12 hr sustained-release] 180 tablet 0    Sig: TAKE ONE TABLET BY MOUTH TWICE DAILY     Psychiatry: Antidepressants - bupropion Passed - 02/13/2022  1:57 PM      Passed - Cr in normal range and within 360 days    Creat  Date Value Ref Range Status  07/23/2016 0.50 0.50 - 1.05 mg/dL Final    Comment:      For patients > or = 62 years of age: The upper reference limit for Creatinine is approximately 13% higher for people identified as African-American.      Creatinine, Ser  Date Value Ref Range Status  10/20/2021 0.67 0.57 - 1.00 mg/dL Final   Creatinine, POC  Date Value Ref Range Status  09/16/2016 100 mg/dL Final   Creatinine, Urine  Date Value Ref Range Status  07/23/2016 47 20 - 320 mg/dL Final         Passed - AST in normal  range and within 360 days    AST  Date Value Ref Range Status  10/19/2021 20 15 - 41 U/L Final         Passed - ALT in normal range and within 360 days    ALT  Date Value Ref Range Status  10/19/2021 20 0 - 44 U/L Final         Passed - Completed PHQ-2 or PHQ-9 in the last 360 days      Passed - Last BP in normal range    BP Readings from Last 1 Encounters:  11/12/21 139/81         Passed - Valid encounter within last 6 months    Recent Outpatient Visits          3 months ago Type 2 diabetes mellitus with other specified complication, with long-term current use of insulin ([place])   [place], [place], MD   4 months ago Type 2 diabetes mellitus with other specified complication, with long-term current use of insulin [facility])   [place], [place], [place]   5 months ago Localized edema   Primary [facility] at [facility], Cari S, PA-C   6 months ago Type 2 diabetes mellitus with other specified complication, with long-term current use of insulin ([place])   [place], [place], MD   9 months ago Hyperlipidemia associated with type 2 diabetes mellitus [facility])   [place], [place], MD      Future Appointments            In 1 week Johney Frame, Greer Ee, MD [address] A Dept Of [place]. [facility], [facility]   In 1 week Charlott Rakes, MD [place]           . traZODone (DESYREL) 50 MG tablet [Pharmacy Med Name: trazodone 50 mg tablet] 90 tablet 0    Sig: TAKE ONE TABLET BY MOUTH EVERYDAY AT BEDTIME     Psychiatry: Antidepressants - Serotonin Modulator Passed - 02/13/2022  1:57 PM      Passed - Completed PHQ-2 or PHQ-9 in the last 360 days      Passed - Valid encounter within last 6 months  Recent Outpatient Visits          3 months ago Type 2 diabetes mellitus  with other specified complication, with long-term current use of insulin (HCC)   [place] [facility] [place], [place], MD   4 months ago Type 2 diabetes mellitus with other specified complication, with long-term current use of insulin [facility])   [place] [facility] [place], [place], [place]   5 months ago Localized edema   Primary Care at [facility], Cari S, PA-C   6 months ago Type 2 diabetes mellitus with other specified complication, with long-term current use of insulin (HCC)   [place] [facility] [place], Odette Horns, MD   9 months ago Hyperlipidemia associated with type 2 diabetes mellitus [facility])   [place] [facility] [name], MD      Future Appointments            In 1 week Shari Prows, Kathlynn Grate, MD [facility] [address] A Dept Of [place]. [facility], [facility]   In 1 week [name], MD [facility] Wellness           . lisinopril (ZESTRIL) 20 MG tablet [Pharmacy Med Name: lisinopril 20 mg tablet] 90 tablet 0    Sig: TAKE ONE TABLET BY MOUTH ONCE DAILY     Cardiovascular:  ACE Inhibitors Passed - 02/13/2022  1:57 PM      Passed - Cr in normal range and within 180 days    Creat  Date Value Ref Range Status  07/23/2016 0.50 0.50 - 1.05 mg/dL Final    Comment:      For patients > or = 61 years of age: The upper reference limit for Creatinine is approximately 13% higher for people identified as African-American.      Creatinine, Ser  Date Value Ref Range Status  10/20/2021 0.67 0.57 - 1.00 mg/dL Final   Creatinine, POC  Date Value Ref Range Status  09/16/2016 100 mg/dL Final   Creatinine, Urine  Date Value Ref Range Status  07/23/2016 47 20 - 320 mg/dL Final         Passed - K in normal range and within 180 days    Potassium  Date Value Ref Range Status  10/20/2021 4.2 3.5 - 5.2 mmol/L Final         Passed - Patient is  not pregnant      Passed - Last BP in normal range    BP Readings from Last 1 Encounters:  11/12/21 139/81         Passed - Valid encounter within last 6 months    Recent Outpatient Visits          3 months ago Type 2 diabetes mellitus with other specified complication, with long-term current use of insulin (HCC)   [place] [facility] [place], [place], MD   4 months ago Type 2 diabetes mellitus with other specified complication, with long-term current use of insulin [facility])   [place] [facility] [place], [place], [place]   5 months ago Localized edema   [facility] at [facility], Cari S, PA-C   6 months ago Type 2 diabetes mellitus with other specified complication, with long-term current use of insulin (HCC)    [place] [facility] [place], Odette Horns, MD   9 months ago Hyperlipidemia associated with type 2 diabetes mellitus (HCC)  [facility] [name], MD      Future Appointments            In 1 week Shari Prows, Kathlynn Grate, MD [facility] A Dept Of [place]. [facility], [facility]   In 1 week [name], MD [facility] And Wellness

## 2022-02-18 NOTE — Progress Notes (Deleted)
Cardiology Office Note:    Date:  02/18/2022   ID:  Sarah Charles, DOB 03/13/1961, MRN [id number]  PCP:  [name], MD   [facility] Providers Cardiologist:  Meriam Sprague, MD {   Referring MD: [name], MD   History of Present Illness:    Sarah Charles is a 61 y.o. female with a hx of CAD, DMII, HLD, obesity, tobacco use, depression, GERD and OSA who presents to clinic for follow-up.  She was evaluated in the ED 10/2017 for chest pressure. She underwent cardiac CTA showing significant mid LAD stenosis by FFR.  She was discharged and later came for outpatient cardiac catheterization 10/2017 revealing 75% stenosis in proximal LAD treated with DES.   Was last seen by Gillian Shields for pre-op clearance for colonoscopy. She was doing well from a CV standpoint.   Today, ***  Past Medical History:  Diagnosis Date   Arthritis    Coronary artery calcification seen on CT scan    a. 07/2017 noted on CT abd.   Depression    GERD (gastroesophageal reflux disease)    Hyperlipidemia    Hypertension    Obesity    Sleep apnea    a. Does not use CPAP "cause i dont think I need it anymore".   Tobacco abuse    Type II diabetes mellitus (HCC)     Past Surgical History:  Procedure Laterality Date   CHOLECYSTECTOMY     CORONARY STENT INTERVENTION N/A 11/04/2017   Procedure: CORONARY STENT INTERVENTION;  Surgeon: Swaziland, Peter M, MD;  Location: [facility] INV[facility] CV LAB;  Service: Cardiovascular;  Laterality: N/A;   INCISION AND DRAINAGE ABSCESS Left 12/22/2012   Procedure: INCISION AND DRAINAGE ABSCESS;  Surgeon: Dalia Heading, MD;  Location: [facility];  Service: General;  Laterality: Left;   INTRAVASCULAR PRESSURE WIRE/FFR STUDY N/A 11/04/2017   Procedure: INTRAVASCULAR PRESSURE WIRE/FFR STUDY;  Surgeon: Swaziland, Peter M, MD;  Location: [facility] INVASIVE CV LAB;  Service: Cardiovascular;  Laterality: N/A;   LEFT HEART CATH AND CORONARY ANGIOGRAPHY N/A 11/04/2017   Procedure: LEFT HEART  CATH AND CORONARY ANGIOGRAPHY;  Surgeon: Swaziland, Peter M, MD;  Location: [facility] INVASIVE CV LAB;  Service: Cardiovascular;  Laterality: N/A;   TONSILLECTOMY     TUBAL LIGATION      Current Medications: No outpatient medications have been marked as taking for the 02/24/22 encounter (Appointment) with Meriam Sprague, MD.     Allergies:   Cymbalta [duloxetine hcl]   Social History   Socioeconomic History   Marital status: Widowed    Spouse name: Not on file   Number of children: Not on file   Years of education: Not on file   Highest education level: Not on file  Occupational History   Not on file  Tobacco Use   Smoking status: Every Day    Packs/day: 1.00    Years: 41.00    Total pack years: 41.00    Types: Cigarettes    Last attempt to quit: 10/07/2018    Years since quitting: 3.3   Smokeless tobacco: Never  Vaping Use   Vaping Use: Never used  Substance and Sexual Activity   Alcohol use: No   Drug use: No   Sexual activity: Never    Birth control/protection: None  Other Topics Concern   Not on file  Social History Narrative   Lives in [place] with husband.  Unemployed.  Does not routinely exercise.   Social Determinants of Health  Financial Resource Strain: Low Risk  (12/22/2020)   Overall Financial Resource Strain (CARDIA)    Difficulty of Paying Living Expenses: Not very hard  Food Insecurity: No Food Insecurity (12/22/2020)   Hunger Vital Sign    Worried About Running Out of Food in the Last Year: Never true    Ran Out of Food in the Last Year: Never true  Transportation Needs: No Transportation Needs (12/22/2020)   PRAPARE - Administrator, Civil Service (Medical): No    Lack of Transportation (Non-Medical): No  Physical Activity: Unknown (12/22/2020)   Exercise Vital Sign    Days of Exercise per Week: Not on file    Minutes of Exercise per Session: 0 min  Stress: No Stress Concern Present (12/22/2020)   [employer] of Occupational Health -  Occupational Stress Questionnaire    Feeling of Stress : Only a little  Social Connections: Socially Isolated (12/22/2020)   Social Connection and Isolation Panel [NHANES]    Frequency of Communication with Friends and Family: More than three times a week    Frequency of Social Gatherings with Friends and Family: Once a week    Attends Religious Services: Never    Database administrator or Organizations: No    Attends Banker Meetings: Never    Marital Status: Widowed     Family History: The patient's ***family history includes Breast cancer in her mother; CAD in her father; COPD in her mother; CVA in her mother; Cancer in her maternal grandfather and maternal uncle; Diabetes in her maternal grandmother and sister; Heart attack in her father and mother; Heart disease in her mother; Hypercholesterolemia in her father; Hypertension in her mother; Kidney Stones in her mother. There is no history of Colon cancer, Esophageal cancer, Stomach cancer, or Pancreatic cancer.  ROS:   Please see the history of present illness.    *** All other systems reviewed and are negative.  EKGs/Labs/Other Studies Reviewed:    The following studies were reviewed today: Echo 10/2017 Study Conclusions   - Left ventricle: The cavity size was normal. Systolic function was    normal. The estimated ejection fraction was in the range of 55%    to 60%.   -------------------------------------------------------------------  Study data:  No prior study was available for comparison.  Study  status:  Routine.  Procedure:  The patient reported no pain pre or  post test. Transthoracic echocardiography. Image quality was fair.  Study completion:  There were no complications.  Transthoracic echocardiography.  M-mode, complete 2D, spectral  Doppler, and color Doppler.  Birthdate:  Patient birthdate:  [birth year].  Age:  Patient is 61 yr old.  Sex:  Gender: female.  BMI: 31.7 kg/m^2.  Blood pressure:     151/91   Patient status:  Inpatient.  Study date:  Study date: 10/30/2017. Study time: 11:39  AM.  Location:  Echo laboratory.   -------------------------------------------------------------------   -------------------------------------------------------------------  Left ventricle:  The cavity size was normal. Systolic function was  normal. The estimated ejection fraction was in the range of 55% to  60%.   -------------------------------------------------------------------  Aortic valve:   Structurally normal valve.   Cusp separation was  normal.  Doppler:  Transvalvular velocity was within the normal  range. There was no stenosis. There was no regurgitation.   -------------------------------------------------------------------  Mitral valve:   Structurally normal valve.   Leaflet separation was  normal.  Doppler:  Transvalvular velocity was within the normal  range. There was no evidence  for stenosis. There was no  regurgitation.   -------------------------------------------------------------------  Left atrium:  The atrium was normal in size.   -------------------------------------------------------------------  Right ventricle:  The cavity size was normal. Wall thickness was  normal. Systolic function was normal.   -------------------------------------------------------------------  Tricuspid valve:   Structurally normal valve.   Leaflet separation  was normal.  Doppler:  Transvalvular velocity was within the normal  range. There was trivial regurgitation.   -------------------------------------------------------------------  Right atrium:  The atrium was normal in size.   -------------------------------------------------------------------  Pericardium: There was no pericardial effusion.  LHC 10/2017 Prox LAD lesion is 70% stenosed. Prox Cx to Mid Cx lesion is 30% stenosed. Prox RCA to Mid RCA lesion is 20% stenosed. Post intervention, there is a 0% residual stenosis. A  drug-eluting stent was successfully placed using a STENT SIERRA 2.50 X 15 MM. LV end diastolic pressure is normal.   1. Single vessel obstructive CAD.    - 70% mid LAD immediately after the first diagonal. FFR 0.78 2. Normal LVEDP 3. Successful PCI of the mid LAD with DES   Plan: DAPT for at least 6 months. She is a candidate for same day discharge. Will hold metformin for 48 hours. Will need to switch Prilosec to Protonix.   EKG:  EKG is *** ordered today.  The ekg ordered today demonstrates ***  Recent Labs: 08/28/2021: TSH 1.977 10/19/2021: ALT 20; B Natriuretic Peptide 11.7; Hemoglobin 12.1; Platelets 257 10/20/2021: BUN 23; Creatinine, Ser 0.67; Potassium 4.2; Sodium 142  Recent Lipid Panel    Component Value Date/Time   CHOL 137 11/12/2021 1018   TRIG 102 11/12/2021 1018   HDL 54 11/12/2021 1018   CHOLHDL 3.9 08/14/2020 1124   CHOLHDL 9.7 (H) 07/23/2016 1638   VLDL NOT CALC 07/23/2016 1638   LDLCALC 64 11/12/2021 1018     Risk Assessment/Calculations:   {Does this patient have ATRIAL FIBRILLATION?:[phone]}  No BP recorded.  {Refresh Note OR Click here to enter BP  :1}***         Physical Exam:    VS:  There were no vitals taken for this visit.    Wt Readings from Last 3 Encounters:  11/12/21 191 lb (86.6 kg)  10/09/21 198 lb 12.8 oz (90.2 kg)  10/08/21 198 lb 6.4 oz (90 kg)     GEN: *** Well nourished, well developed in no acute distress HEENT: Normal NECK: No JVD; No carotid bruits LYMPHATICS: No lymphadenopathy CARDIAC: ***RRR, no murmurs, rubs, gallops RESPIRATORY:  Clear to auscultation without rales, wheezing or rhonchi  ABDOMEN: Soft, non-tender, non-distended MUSCULOSKELETAL:  No edema; No deformity  SKIN: Warm and dry NEUROLOGIC:  Alert and oriented x 3 PSYCHIATRIC:  Normal affect   ASSESSMENT:    No diagnosis found. PLAN:    In order of problems listed above:  #CAD: Patient with history of prox LAD PCI in 10/2017. Currently doing well  with no anginal symptoms.  -Continue ASA 81mg  daily -Continue metop 25mg  BID -Continue crestor 40mg  daily -Continue praluent 75mg  q 14days -Continue fenofibrate 145mg  daily  #HLD: -Continue crestor 40mg  daily -Continue praluent 75mg  q 14days -Continue fenofibrate 145mg  daily  #HTN: -Continue metop 25mg  BID -Continue amlodipine 10mg  daily -Continue lisinopril 20mg  daily  #DMII: -Management per PCP      {Are you ordering a CV Procedure (e.g. stress test, cath, DCCV, TEE, etc)?   Press F2        :[id number]}    Medication Adjustments/Labs and Tests Ordered: Current medicines are reviewed  at length with the patient today.  Concerns regarding medicines are outlined above.  No orders of the defined types were placed in this encounter.  No orders of the defined types were placed in this encounter.   There are no Patient Instructions on file for this visit.   Signed, Meriam Sprague, MD  02/18/2022 2:13 PM    [facility]

## 2022-02-20 ENCOUNTER — Other Ambulatory Visit: Payer: Self-pay | Admitting: Family Medicine

## 2022-02-20 DIAGNOSIS — R21 Rash and other nonspecific skin eruption: Secondary | ICD-10-CM

## 2022-02-22 NOTE — Telephone Encounter (Signed)
Requested medication (s) are due for refill today:yes  Requested medication (s) are on the active medication list:yes  Last refill:  01/05/22  Future visit scheduled:yes  Notes to clinic:  Unable to refill per protocol, cannot delegate.      Requested Prescriptions  Pending Prescriptions Disp Refills   triamcinolone cream (KENALOG) 0.1 % [Pharmacy Med Name: triamcinolone acetonide 0.1 % topical cream] 45 g 1    Sig: APPLY TO THE AFFECTED AREA(S) ON BOTH LEGS TWICE DAILY     Not Delegated - Dermatology:  Corticosteroids Failed - 02/20/2022  8:05 AM      Failed - This refill cannot be delegated      Passed - Valid encounter within last 12 months    Recent Outpatient Visits           3 months ago Type 2 diabetes mellitus with other specified complication, with long-term current use of insulin ([place])   [place], [place], MD   4 months ago Type 2 diabetes mellitus with other specified complication, with long-term current use of insulin [facility])   [place], [employer], [place]   5 months ago Localized edema   [facility], Cari S, PA-C   6 months ago Type 2 diabetes mellitus with other specified complication, with long-term current use of insulin ([place])   [place], [place], MD   9 months ago Hyperlipidemia associated with type 2 diabetes mellitus [facility])   [place], [place], MD       Future Appointments             In 2 days Johney Frame, Greer Ee, MD [place]. [facility], [facility]   In 3 days Charlott Rakes, MD [place]

## 2022-02-24 ENCOUNTER — Ambulatory Visit: Payer: Medicare (Managed Care) | Admitting: Gastroenterology

## 2022-02-24 ENCOUNTER — Ambulatory Visit: Payer: Medicare (Managed Care) | Admitting: Cardiology

## 2022-02-25 ENCOUNTER — Ambulatory Visit: Payer: Medicare Other | Attending: Family Medicine | Admitting: Family Medicine

## 2022-02-25 ENCOUNTER — Encounter: Payer: Self-pay | Admitting: Family Medicine

## 2022-02-25 VITALS — BP 148/76 | HR 63 | Temp 98.0°F | Ht 62.0 in | Wt 199.8 lb

## 2022-02-25 DIAGNOSIS — Z794 Long term (current) use of insulin: Secondary | ICD-10-CM | POA: Diagnosis not present

## 2022-02-25 DIAGNOSIS — R600 Localized edema: Secondary | ICD-10-CM

## 2022-02-25 DIAGNOSIS — Z23 Encounter for immunization: Secondary | ICD-10-CM | POA: Diagnosis not present

## 2022-02-25 DIAGNOSIS — E1169 Type 2 diabetes mellitus with other specified complication: Secondary | ICD-10-CM | POA: Diagnosis not present

## 2022-02-25 DIAGNOSIS — E1142 Type 2 diabetes mellitus with diabetic polyneuropathy: Secondary | ICD-10-CM | POA: Diagnosis not present

## 2022-02-25 DIAGNOSIS — I2510 Atherosclerotic heart disease of native coronary artery without angina pectoris: Secondary | ICD-10-CM

## 2022-02-25 DIAGNOSIS — I152 Hypertension secondary to endocrine disorders: Secondary | ICD-10-CM

## 2022-02-25 DIAGNOSIS — E785 Hyperlipidemia, unspecified: Secondary | ICD-10-CM

## 2022-02-25 DIAGNOSIS — E1159 Type 2 diabetes mellitus with other circulatory complications: Secondary | ICD-10-CM

## 2022-02-25 LAB — POCT GLYCOSYLATED HEMOGLOBIN (HGB A1C): HbA1c, POC (controlled diabetic range): 7.4 % — AB (ref 0.0–7.0)

## 2022-02-25 LAB — POCT ABI - SCREENING FOR PILOT NO CHARGE
Left ABI: 1.21
Right ABI: 1.19

## 2022-02-25 LAB — GLUCOSE, POCT (MANUAL RESULT ENTRY): POC Glucose: 116 mg/dl — AB (ref 70–99)

## 2022-02-25 MED ORDER — SEMAGLUTIDE (1 MG/DOSE) 4 MG/3ML ~~LOC~~ SOPN: 1.0000 mg | PEN_INJECTOR | SUBCUTANEOUS | 0 refills | Status: DC

## 2022-02-25 MED ORDER — METFORMIN HCL 500 MG PO TABS: 1000.0000 mg | ORAL_TABLET | Freq: Two times a day (BID) | ORAL | 1 refills | Status: DC

## 2022-02-25 MED ORDER — METOPROLOL SUCCINATE ER 25 MG PO TB24: 25.0000 mg | ORAL_TABLET | Freq: Every day | ORAL | 1 refills | Status: DC

## 2022-02-25 MED ORDER — SEMAGLUTIDE (2 MG/DOSE) 8 MG/3ML ~~LOC~~ SOPN: 2.0000 mg | PEN_INJECTOR | SUBCUTANEOUS | 6 refills | Status: DC

## 2022-02-25 MED ORDER — LISINOPRIL 40 MG PO TABS: 40.0000 mg | ORAL_TABLET | Freq: Every day | ORAL | 1 refills | Status: DC

## 2022-02-25 NOTE — Progress Notes (Signed)
Subjective:  Patient ID: Sarah Charles, female    DOB: 1961/04/06  Age: 61 y.o. MRN: [id number]  CC: Diabetes   HPI Sarah Charles is a 61 y.o. year old female with a history of type 2 diabetes mellitus (A1c 7.4), hypertension, hyperlipidemia, GERD, CAD (s/p DES).  Interval History:  BP is elevated and she did not take her antihypertensive today as she is fasting in anticipation of labs.  Complains of pedal edema x3 weeks and endorses ingestion of high sodium foods. Had an Urgent Care visit for this 5 months ago and has been on Lasix. Also endorses weight gain Denies orthopnea, PND, chest pain.  There is no change in her exercise tolerance. Her lower extremities hurt from her hips down. Echo from 10/2021 revealed EF of 70 to 75%, grade 1 DD, hyperdynamic LV function, moderately dilated LA.  With regards to her diabetes she endorses adherence with her medication with no symptoms of hypoglycemia.  Neuropathy is uncontrolled on her current regimen. Past Medical History:  Diagnosis Date   Arthritis    Coronary artery calcification seen on CT scan    a. 07/2017 noted on CT abd.   Depression    GERD (gastroesophageal reflux disease)    Hyperlipidemia    Hypertension    Obesity    Sleep apnea    a. Does not use CPAP "cause i dont think I need it anymore".   Tobacco abuse    Type II diabetes mellitus ([place])     Past Surgical History:  Procedure Laterality Date   CHOLECYSTECTOMY     CORONARY STENT INTERVENTION N/A 11/04/2017   Procedure: CORONARY STENT INTERVENTION;  Surgeon: Martinique, Peter M, MD;  Location: [facility];  Service: Cardiovascular;  Laterality: N/A;   INCISION AND DRAINAGE ABSCESS Left 12/22/2012   Procedure: INCISION AND DRAINAGE ABSCESS;  Surgeon: Jamesetta So, MD;  Location: [facility];  Service: General;  Laterality: Left;   INTRAVASCULAR PRESSURE WIRE/FFR STUDY N/A 11/04/2017   Procedure: INTRAVASCULAR PRESSURE WIRE/FFR STUDY;  Surgeon: Martinique, Peter M, MD;  Location:  [facility];  Service: Cardiovascular;  Laterality: N/A;   LEFT HEART CATH AND CORONARY ANGIOGRAPHY N/A 11/04/2017   Procedure: LEFT HEART CATH AND CORONARY ANGIOGRAPHY;  Surgeon: Martinique, Peter M, MD;  Location: [facility];  Service: Cardiovascular;  Laterality: N/A;   TONSILLECTOMY     TUBAL LIGATION      Family History  Problem Relation Age of Onset   Hypertension Mother    CVA Mother    COPD Mother    Heart disease Mother    Kidney Stones Mother    Heart attack Mother        MI in late 69's   Breast cancer Mother    CAD Father    Hypercholesterolemia Father    Heart attack Father        Died @ 1 of MI   Diabetes Sister    Cancer Maternal Uncle    Diabetes Maternal Grandmother    Cancer Maternal Grandfather        lung cancer   Colon cancer Neg Hx    Esophageal cancer Neg Hx    Stomach cancer Neg Hx    Pancreatic cancer Neg Hx     Social History   Socioeconomic History   Marital status: Widowed    Spouse name: Not on file   Number of children: Not on file   Years of education: Not on file  Highest education level: Not on file  Occupational History   Not on file  Tobacco Use   Smoking status: Every Day    Packs/day: 1.00    Years: 41.00    Total pack years: 41.00    Types: Cigarettes    Last attempt to quit: 10/07/2018    Years since quitting: 3.3   Smokeless tobacco: Never  Vaping Use   Vaping Use: Never used  Substance and Sexual Activity   Alcohol use: No   Drug use: No   Sexual activity: Never    Birth control/protection: None  Other Topics Concern   Not on file  Social History Narrative   Lives in [place] with husband.  Unemployed.  Does not routinely exercise.   Social Determinants of Health   Financial Resource Strain: Low Risk  (12/22/2020)   Overall Financial Resource Strain (CARDIA)    Difficulty of Paying Living Expenses: Not very hard  Food Insecurity: No Food Insecurity (12/22/2020)   Hunger Vital Sign    Worried About  Running Out of Food in the Last Year: Never true    Ran Out of Food in the Last Year: Never true  Transportation Needs: No Transportation Needs (12/22/2020)   PRAPARE - Hydrologist (Medical): No    Lack of Transportation (Non-Medical): No  Physical Activity: Unknown (12/22/2020)   Exercise Vital Sign    Days of Exercise per Week: Not on file    Minutes of Exercise per Session: 0 min  Stress: No Stress Concern Present (12/22/2020)   [place]    Feeling of Stress : Only a little  Social Connections: Socially Isolated (12/22/2020)   Social Connection and Isolation Panel [NHANES]    Frequency of Communication with Friends and Family: More than three times a week    Frequency of Social Gatherings with Friends and Family: Once a week    Attends Religious Services: Never    Marine scientist or Organizations: No    Attends Archivist Meetings: Never    Marital Status: Widowed    Allergies  Allergen Reactions   Cymbalta [Duloxetine Hcl] Nausea Only    Nausea, lack of therapeutic effect    Outpatient Medications Prior to Visit  Medication Sig Dispense Refill   aspirin EC 81 MG tablet Take 81 mg by mouth daily.     Blood Glucose Monitoring Suppl (ONETOUCH VERIO) w/Device KIT Use to test blood sugar 3 times a day 1 kit 0   buPROPion (WELLBUTRIN SR) 150 MG 12 hr tablet TAKE ONE TABLET BY MOUTH TWICE DAILY 180 tablet 0   cetirizine (ZYRTEC) 10 MG tablet TAKE 1 TABLET BY MOUTH EVERY DAY 30 tablet 2   clotrimazole (LOTRIMIN) 1 % cream Apply 1 Application topically 2 (two) times daily. Apply beneath both breasts 60 g 1   diclofenac Sodium (VOLTAREN) 1 % GEL Apply 4 g topically 4 (four) times daily. 100 g 1   dicyclomine (BENTYL) 10 MG capsule Take 1 capsule (10 mg total) by mouth daily. 30 capsule 1   fenofibrate (TRICOR) 145 MG tablet Take 1 tablet (145 mg total) by mouth daily. 90 tablet 3    FLUoxetine (PROZAC) 20 MG capsule TAKE ONE CAPSULE BY MOUTH EVERY MORNING 90 capsule 0   furosemide (LASIX) 20 MG tablet Take 1 tablet (20 mg total) by mouth daily. 90 tablet 1   gabapentin (NEURONTIN) 300 MG capsule TAKE TWO CAPSULES BY  MOUTH TWICE DAILY 360 capsule 1   glucose blood (ONETOUCH VERIO) test strip Use as instructed 100 each 12   insulin glargine (LANTUS SOLOSTAR) 100 UNIT/ML Solostar Pen INJECT 50 UNITS into THE SKIN AT BEDTIME 45 mL 1   Insulin Pen Needle (COMFORT EZ PEN NEEDLES) 31G X 5 MM MISC USE AS DIRECTED ONCE DAILY 100 each 0   Lancets (ONETOUCH DELICA PLUS [id number]) MISC Use to test blood sugar 3 times a day 100 each 12   Lancets Misc. (ACCU-CHEK FASTCLIX LANCET) KIT USE AS DIRECTED 1 kit 11   lidocaine (LIDODERM) 5 % Place 1 patch onto the skin daily. Remove & Discard patch within 12 hours or as directed by MD 30 patch 1   metoCLOPramide (REGLAN) 10 MG tablet TAKE 1 TABLET BY MOUTH EVERY EIGHT HOURS AS NEEDED FOR NAUSEA OR VOMITING. 40 tablet 0   Multiple Vitamin (MULTIVITAMIN) tablet Take 1 tablet by mouth daily.     mupirocin ointment (BACTROBAN) 2 % APPLY TO THE AFFECTED AREA(S) twice daily 22 g 2   naproxen (NAPROSYN) 375 MG tablet Take 1 tablet (375 mg total) by mouth 2 (two) times daily with a meal. 20 tablet 0   nitroGLYCERIN (NITROSTAT) 0.4 MG SL tablet Place 0.4 mg under the tongue every 5 (five) minutes as needed for chest pain.     pantoprazole (PROTONIX) 40 MG tablet TAKE ONE TABLET BY MOUTH ONCE DAILY 90 tablet 1   PRALUENT 75 MG/ML SOAJ INJECT 75 MG into THE SKIN every 14 DAYS 2 mL 11   rosuvastatin (CRESTOR) 40 MG tablet TAKE ONE TABLET BY MOUTH EVERY EVENING 90 tablet 1   terbinafine (LAMISIL) 250 MG tablet Please take one a day x 7days, repeat every 4 weeks x 4 months 28 tablet 0   traZODone (DESYREL) 50 MG tablet TAKE ONE TABLET BY MOUTH EVERYDAY AT BEDTIME 90 tablet 0   triamcinolone cream (KENALOG) 0.1 % APPLY TO THE AFFECTED AREA(S) ON BOTH LEGS  TWICE DAILY 45 g 0   vitamin B-12 (CYANOCOBALAMIN) 500 MCG tablet TAKE ONE TABLET BY MOUTH ONCE DAILY 30 tablet 2   vitamin C (ASCORBIC ACID) 500 MG tablet Take 500 mg by mouth daily.     amLODipine (NORVASC) 10 MG tablet TAKE ONE TABLET BY MOUTH ONCE DAILY 90 tablet 1   baclofen (LIORESAL) 10 MG tablet Take one tab PO TID PRN pain/ spasm 30 each 0   clotrimazole-betamethasone (LOTRISONE) cream Apply 1 application topically 2 (two) times daily. 30 g 0   liraglutide (VICTOZA) 18 MG/3ML SOPN INJECT 1.1m SUBCUTANEOUSLY ONCE A DAY 6 mL 6   lisinopril (ZESTRIL) 20 MG tablet TAKE ONE TABLET BY MOUTH ONCE DAILY 90 tablet 0   metFORMIN (GLUCOPHAGE) 500 MG tablet TAKE TWO TABLETS BY MOUTH TWICE DAILY WITH A MEAL 120 tablet 0   metoprolol tartrate (LOPRESSOR) 25 MG tablet TAKE ONE TABLET BY MOUTH TWICE DAILY 180 tablet 0   potassium chloride (KLOR-CON) 10 MEQ tablet 1 tab every other day When you take furosemide 45 tablet 1   No facility-administered medications prior to visit.     ROS Review of Systems  Constitutional:  Negative for activity change and appetite change.  HENT:  Negative for sinus pressure and sore throat.   Respiratory:  Negative for chest tightness, shortness of breath and wheezing.   Cardiovascular:  Positive for leg swelling. Negative for chest pain and palpitations.  Gastrointestinal:  Negative for abdominal distention, abdominal pain and constipation.  Genitourinary: Negative.  Musculoskeletal: Negative.   Psychiatric/Behavioral:  Negative for behavioral problems and dysphoric mood.     Objective:  BP (!) 148/76   Pulse 63   Temp 98 F (36.7 C) (Oral)   Ht 5' 2" (1.575 m)   Wt 199 lb 12.8 oz (90.6 kg)   SpO2 98%   BMI 36.54 kg/m      02/25/2022    9:35 AM 11/12/2021    9:18 AM 10/19/2021    5:57 PM  BP/Weight  Systolic BP 358 251 898  Diastolic BP 76 81 90  Wt. (Lbs) 199.8 191   BMI 36.54 kg/m2 34.93 kg/m2     Wt Readings from Last 3 Encounters:   02/25/22 199 lb 12.8 oz (90.6 kg)  11/12/21 191 lb (86.6 kg)  10/09/21 198 lb 12.8 oz (90.2 kg)     Physical Exam Constitutional:      Appearance: She is well-developed.  Neck:     Comments: No JVD Cardiovascular:     Rate and Rhythm: Normal rate.     Heart sounds: Normal heart sounds. No murmur heard. Pulmonary:     Effort: Pulmonary effort is normal.     Breath sounds: Normal breath sounds. No wheezing or rales.  Chest:     Chest wall: No tenderness.  Abdominal:     General: Bowel sounds are normal. There is no distension.     Palpations: Abdomen is soft. There is no mass.     Tenderness: There is no abdominal tenderness.  Musculoskeletal:        General: Normal range of motion.     Right lower leg: Edema present.     Left lower leg: Edema present.  Neurological:     Mental Status: She is alert and oriented to person, place, and time.  Psychiatric:        Mood and Affect: Mood normal.    Diabetic Foot Exam - Simple   Simple Foot Form Diabetic Foot exam was performed with the following findings: Yes 02/25/2022 10:18 AM  Visual Inspection No deformities, no ulcerations, no other skin breakdown bilaterally: Yes Sensation Testing Intact to touch and monofilament testing bilaterally: Yes Pulse Check See comments: Yes Comments Non palpable dorsalis pedis and posterior tibialis, 2+ bilateral pitting pedal edema ABI: Left-1.21, right-1.19        Latest Ref Rng & Units 10/20/2021    9:07 AM 10/19/2021    1:45 PM 10/08/2021    9:44 AM  CMP  Glucose 70 - 99 mg/dL 87  263  181   BUN 8 - 27 mg/dL _0 Creatinine 0.57 - 1.00 mg/dL 0.67  0.64  0.66   Sodium 134 - 144 mmol/L 142  139  143   Potassium 3.5 - 5.2 mmol/L 4.2  3.8  4.2   Chloride 96 - 106 mmol/L 104  107  104   CO2 20 - 29 mmol/L _1 Calcium 8.7 - 10.3 mg/dL 9.9  9.4  9.8   Total Protein 6.5 - 8.1 g/dL  6.3    Total Bilirubin 0.3 - 1.2 mg/dL  <0.1    Alkaline Phos 38 - 126 U/L  75    AST  15 - 41 U/L  20    ALT 0 - 44 U/L  20      Lipid Panel     Component Value Date/Time   CHOL 137 11/12/2021 1018   TRIG 102 11/12/2021 1018  HDL 54 11/12/2021 1018   CHOLHDL 3.9 08/14/2020 1124   CHOLHDL 9.7 (H) 07/23/2016 1638   VLDL NOT CALC 07/23/2016 1638   LDLCALC 64 11/12/2021 1018    CBC    Component Value Date/Time   WBC 6.0 10/19/2021 1345   RBC 3.95 10/19/2021 1345   HGB 12.1 10/19/2021 1345   HGB 14.2 11/09/2018 1158   HCT 36.8 10/19/2021 1345   HCT 42.8 11/09/2018 1158   PLT 257 10/19/2021 1345   PLT 321 11/09/2018 1158   MCV 93.2 10/19/2021 1345   MCV 93 11/09/2018 1158   MCH 30.6 10/19/2021 1345   MCHC 32.9 10/19/2021 1345   RDW 13.0 10/19/2021 1345   RDW 12.5 11/09/2018 1158   LYMPHSABS 1.7 10/19/2021 1345   LYMPHSABS 2.0 11/09/2018 1158   MONOABS 0.4 10/19/2021 1345   EOSABS 0.1 10/19/2021 1345   EOSABS 0.1 11/09/2018 1158   BASOSABS 0.1 10/19/2021 1345   BASOSABS 0.1 11/09/2018 1158    Lab Results  Component Value Date   HGBA1C 7.4 (A) 02/25/2022    Assessment & Plan:  1. Type 2 diabetes mellitus with other specified complication, with long-term current use of insulin (HCC) Controlled with A1c of 7.4; goal is less than 7.0 We will substitute Victoza with Ozempic due to ease of administration and additional weight benefit with plans to titrate up to 2 mg after 4 weeks Counseled on Diabetic diet, my plate method, 010 minutes of moderate intensity exercise/week Blood sugar logs with fasting goals of 80-120 mg/dl, random of less than 180 and in the event of sugars less than 60 mg/dl or greater than 400 mg/dl encouraged to notify the clinic. Advised on the need for annual eye exams, annual foot exams, Pneumonia vaccine. - POCT glucose (manual entry) - POCT glycosylated hemoglobin (Hb A1C) - Semaglutide, 1 MG/DOSE, 4 MG/3ML SOPN; Inject 1 mg as directed once a week. For 4 weeks then increase to 91m weekly thereafter  Dispense: 3 mL; Refill: 0 -  Semaglutide, 2 MG/DOSE, 8 MG/3ML SOPN; Inject 2 mg as directed once a week.  Dispense: 3 mL; Refill: 6 - CMP14+EGFR - metFORMIN (GLUCOPHAGE) 500 MG tablet; Take 2 tablets (1,000 mg total) by mouth 2 (two) times daily with a meal.  Dispense: 360 tablet; Refill: 1  2. Hypertension associated with diabetes ([place] Uncontrolled due to the fact that she is yet to take her medications today Medication adherence emphasized Counseled on blood pressure goal of less than 130/80, low-sodium, DASH diet, medication compliance, 150 minutes of moderate intensity exercise per week. Discussed medication compliance, adverse effects. - metoprolol succinate (TOPROL-XL) 25 MG 24 hr tablet; Take 1 tablet (25 mg total) by mouth daily. Take with or immediately following a meal.  Dispense: 90 tablet; Refill: 1 - lisinopril (ZESTRIL) 40 MG tablet; Take 1 tablet (40 mg total) by mouth daily.  Dispense: 90 tablet; Refill: 1  3. Pedal edema Intake of high sodium foods contributing We will discontinue amlodipine which could also be contributing Encouraged to comply with a low-sodium diet, elevate feet, use compression stockings - Pro b natriuretic peptide - POCT ABI Screening for Pilot No Charge  4. Diabetic polyneuropathy associated with type 2 diabetes mellitus ([place] Uncontrolled Currently on gabapentin She is currently concerned about taking too many medications hence I will hold off on adding Cymbalta Increasing her dose of gabapentin might cause more sedation Consider switching to Lyrica if uncontrolled  5. Hyperlipidemia associated with type 2 diabetes mellitus (HCC) Controlled Low-cholesterol diet Continue  Crestor and Praluent Low-cholesterol diet  6. Coronary artery disease involving native coronary artery of native heart without angina pectoris Asymptomatic Continue risk factor modification  7. Need for immunization against influenza - Flu Vaccine QUAD 20moIM (Fluarix, Fluzone & Alfiuria Quad PF)  8.  Need for pneumococcal vaccine - Pneumococcal conjugate vaccine 20-valent    Meds ordered this encounter  Medications   metoprolol succinate (TOPROL-XL) 25 MG 24 hr tablet    Sig: Take 1 tablet (25 mg total) by mouth daily. Take with or immediately following a meal.    Dispense:  90 tablet    Refill:  1    Discontinue Metoprolol tartrate, discontinue potassium   lisinopril (ZESTRIL) 40 MG tablet    Sig: Take 1 tablet (40 mg total) by mouth daily.    Dispense:  90 tablet    Refill:  1    Dose increased, discontinue Amlodipine   Semaglutide, 1 MG/DOSE, 4 MG/3ML SOPN    Sig: Inject 1 mg as directed once a week. For 4 weeks then increase to 231mweekly thereafter    Dispense:  3 mL    Refill:  0   Semaglutide, 2 MG/DOSE, 8 MG/3ML SOPN    Sig: Inject 2 mg as directed once a week.    Dispense:  3 mL    Refill:  6    Discontinue Victoza   metFORMIN (GLUCOPHAGE) 500 MG tablet    Sig: Take 2 tablets (1,000 mg total) by mouth 2 (two) times daily with a meal.    Dispense:  360 tablet    Refill:  1    Follow-up: Return in about 1 month (around 03/28/2022) for Blood Pressure follow-up, edema.       EnCharlott RakesMD, FAAFP. CoLaurel Surgery And [facility] LLCnd WeJordan Hillr[place]awayNCK[place] 02/25/2022, 2:19 PM

## 2022-02-26 LAB — CMP14+EGFR
ALT: 18 IU/L (ref 0–32)
AST: 19 IU/L (ref 0–40)
Albumin/Globulin Ratio: 1.6 (ref 1.2–2.2)
Albumin: 4.1 g/dL (ref 3.9–4.9)
Alkaline Phosphatase: 77 IU/L (ref 44–121)
BUN/Creatinine Ratio: 28 (ref 12–28)
BUN: 20 mg/dL (ref 8–27)
Bilirubin Total: <0.2 mg/dL (ref 0.0–1.2)
CO2: 24 mmol/L (ref 20–29)
Calcium: 9.4 mg/dL (ref 8.7–10.3)
Chloride: 100 mmol/L (ref 96–106)
Creatinine, Ser: 0.71 mg/dL (ref 0.57–1.00)
Globulin, Total: 2.6 g/dL (ref 1.5–4.5)
Glucose: 101 mg/dL — ABNORMAL HIGH (ref 70–99)
Potassium: 4.1 mmol/L (ref 3.5–5.2)
Sodium: 140 mmol/L (ref 134–144)
Total Protein: 6.7 g/dL (ref 6.0–8.5)
eGFR: 97 mL/min/{1.73_m2} (ref >59)

## 2022-02-26 LAB — PRO B NATRIURETIC PEPTIDE: NT-Pro BNP: 71 pg/mL (ref 0–287)

## 2022-03-07 ENCOUNTER — Other Ambulatory Visit: Payer: Self-pay | Admitting: Family Medicine

## 2022-03-07 DIAGNOSIS — B372 Candidiasis of skin and nail: Secondary | ICD-10-CM

## 2022-03-08 NOTE — Telephone Encounter (Signed)
Requested medication (s) are due for refill today: yes  Requested medication (s) are on the active medication list: yes  Last refill:  11/12/21  Future visit scheduled: yes  Notes to clinic:  Unable to refill per protocol, Medication not assigned to a protocol, review manually     Requested Prescriptions  Pending Prescriptions Disp Refills   clotrimazole (LOTRIMIN) 1 % cream [Pharmacy Med Name: clotrimazole 1 % topical cream] 60 g 1    Sig: APPLY TO THE AFFECTED AREA(S) beneath BOTH breasts TWICE DAILY     Off-Protocol Failed - 03/07/2022  8:03 AM      Failed - Medication not assigned to a protocol, review manually.      Passed - Valid encounter within last 12 months    Recent Outpatient Visits           1 week ago Type 2 diabetes mellitus with other specified complication, with long-term current use of insulin ([place])   [place], [place], MD   3 months ago Type 2 diabetes mellitus with other specified complication, with long-term current use of insulin ([place])   [place], [place], MD   5 months ago Type 2 diabetes mellitus with other specified complication, with long-term current use of insulin [facility])   [place]oles, [place], [place]   6 months ago Localized edema   [facility], Cari S, PA-C   7 months ago Type 2 diabetes mellitus with other specified complication, with long-term current use of insulin ([place])   [place], MD       Future Appointments             In 1 month Charlott Rakes, MD [place]

## 2022-03-10 ENCOUNTER — Other Ambulatory Visit: Payer: Self-pay | Admitting: Family Medicine

## 2022-03-10 DIAGNOSIS — Z794 Long term (current) use of insulin: Secondary | ICD-10-CM

## 2022-03-10 DIAGNOSIS — E1159 Type 2 diabetes mellitus with other circulatory complications: Secondary | ICD-10-CM

## 2022-03-10 NOTE — Telephone Encounter (Signed)
Medication Refill - Medication: lisinopril (ZESTRIL) 40 MG tablet  (100 day quantity since it is more affordable at the pharmacy for her copays)  metFORMIN (GLUCOPHAGE) 500 MG tablet (100 day quantity request)   Has the patient contacted their pharmacy? Yes.   (Agent: If no, request that the patient contact the pharmacy for the refill. If patient does not wish to contact the pharmacy document the reason why and proceed with request.) (Agent: If yes, when and what did the pharmacy advise?)  Preferred Pharmacy (with phone number or street name):  [facility] - [place], [place] - [address]  [address] [place] [address]  Phone: [phone] Fax: [phone]   Has the patient been seen for an appointment in the last year OR does the patient have an upcoming appointment? Yes.    Agent: Please be advised that RX refills may take up to 3 business days. We ask that you follow-up with your pharmacy.

## 2022-03-10 NOTE — Telephone Encounter (Signed)
Both ordered 02/25/22  Lisinopril #90 1 RF Metformin #360 1 RF  Requested Prescriptions  Refused Prescriptions Disp Refills  . lisinopril (ZESTRIL) 40 MG tablet 90 tablet 1    Sig: Take 1 tablet (40 mg total) by mouth daily.     Cardiovascular:  ACE Inhibitors Failed - 03/10/2022  1:09 PM      Failed - Last BP in normal range    BP Readings from Last 1 Encounters:  02/25/22 (!) 148/76         Passed - Cr in normal range and within 180 days    Creat  Date Value Ref Range Status  07/23/2016 0.50 0.50 - 1.05 mg/dL Final    Comment:      For patients > or = 61 years of age: The upper reference limit for Creatinine is approximately 13% higher for people identified as African-American.      Creatinine, Ser  Date Value Ref Range Status  02/25/2022 0.71 0.57 - 1.00 mg/dL Final   Creatinine, POC  Date Value Ref Range Status  09/16/2016 100 mg/dL Final   Creatinine, Urine  Date Value Ref Range Status  07/23/2016 47 20 - 320 mg/dL Final         Passed - K in normal range and within 180 days    Potassium  Date Value Ref Range Status  02/25/2022 4.1 3.5 - 5.2 mmol/L Final         Passed - Patient is not pregnant      Passed - Valid encounter within last 6 months    Recent Outpatient Visits          1 week ago Type 2 diabetes mellitus with other specified complication, with long-term current use of insulin ([place])   [place], [place], MD   3 months ago Type 2 diabetes mellitus with other specified complication, with long-term current use of insulin ([place])   [place], [place], MD   5 months ago Type 2 diabetes mellitus with other specified complication, with long-term current use of insulin [facility])   [place], [place], [place]   6 months ago Localized edema   [facility], Cari S, PA-C   7 months ago Type 2 diabetes mellitus with other  specified complication, with long-term current use of insulin ([place])   [place], Enobong, MD      Future Appointments            In 1 month Charlott Rakes, MD [place]           . metFORMIN (GLUCOPHAGE) 500 MG tablet 360 tablet 1    Sig: Take 2 tablets (1,000 mg total) by mouth 2 (two) times daily with a meal.     Endocrinology:  Diabetes - Biguanides Failed - 03/10/2022  1:09 PM      Failed - B12 Level in normal range and within 720 days    Vitamin B-12  Date Value Ref Range Status  03/23/2018 827 232 - 1,245 pg/mL Final         Passed - Cr in normal range and within 360 days    Creat  Date Value Ref Range Status  07/23/2016 0.50 0.50 - 1.05 mg/dL Final    Comment:      For patients > or = 61 years of age: The upper reference  limit for Creatinine is approximately 13% higher for people identified as African-American.      Creatinine, Ser  Date Value Ref Range Status  02/25/2022 0.71 0.57 - 1.00 mg/dL Final   Creatinine, POC  Date Value Ref Range Status  09/16/2016 100 mg/dL Final   Creatinine, Urine  Date Value Ref Range Status  07/23/2016 47 20 - 320 mg/dL Final         Passed - HBA1C is between 0 and 7.9 and within 180 days    HbA1c, POC (controlled diabetic range)  Date Value Ref Range Status  02/25/2022 7.4 (A) 0.0 - 7.0 % Final         Passed - eGFR in normal range and within 360 days    GFR, Est African American  Date Value Ref Range Status  07/23/2016 >89 >=60 mL/min Final   GFR calc Af Amer  Date Value Ref Range Status  12/10/2019 >60 >60 mL/min Final   GFR, Est Non African American  Date Value Ref Range Status  07/23/2016 >89 >=60 mL/min Final   GFR, Estimated  Date Value Ref Range Status  10/19/2021 >60 >60 mL/min Final    Comment:    (NOTE) Calculated using the CKD-EPI Creatinine Equation (2021)    eGFR  Date Value Ref Range Status  02/25/2022 97 >59  mL/min/1.73 Final         Passed - Valid encounter within last 6 months    Recent Outpatient Visits          1 week ago Type 2 diabetes mellitus with other specified complication, with long-term current use of insulin ([place])   [place], [place], MD   3 months ago Type 2 diabetes mellitus with other specified complication, with long-term current use of insulin ([place])   [place], [place], MD   5 months ago Type 2 diabetes mellitus with other specified complication, with long-term current use of insulin ([place])   [place], [place], [place]   6 months ago Localized edema   [facility], Cari S, PA-C   7 months ago Type 2 diabetes mellitus with other specified complication, with long-term current use of insulin ([place])   [place], Enobong, MD      Future Appointments            In 1 month Charlott Rakes, MD [place] - CBC within normal limits and completed in the last 12 months    WBC  Date Value Ref Range Status  10/19/2021 6.0 4.0 - 10.5 K/uL Final   RBC  Date Value Ref Range Status  10/19/2021 3.95 3.87 - 5.11 MIL/uL Final   Hemoglobin  Date Value Ref Range Status  10/19/2021 12.1 12.0 - 15.0 g/dL Final  11/09/2018 14.2 11.1 - 15.9 g/dL Final   HCT  Date Value Ref Range Status  10/19/2021 36.8 36.0 - 46.0 % Final   Hematocrit  Date Value Ref Range Status  11/09/2018 42.8 34.0 - 46.6 % Final   MCHC  Date Value Ref Range Status  10/19/2021 32.9 30.0 - 36.0 g/dL Final   [facility]  Date Value Ref Range Status  10/19/2021 30.6 26.0 - 34.0 pg Final   MCV  Date Value Ref Range Status  10/19/2021 93.2 80.0 - 100.0 fL  Final  11/09/2018 93 79 - 97 fL Final   No results found for: "PLTCOUNTKUC", "LABPLAT", "POCPLA" RDW  Date Value Ref Range Status   10/19/2021 13.0 11.5 - 15.5 % Final  11/09/2018 12.5 11.7 - 15.4 % Final

## 2022-03-15 ENCOUNTER — Telehealth: Payer: Self-pay

## 2022-03-15 NOTE — Telephone Encounter (Signed)
Lidocaine patch 5% PA approved until 05/24/2023-claim successfully processed at [employer] per [employer].

## 2022-03-19 ENCOUNTER — Encounter (HOSPITAL_COMMUNITY): Payer: Self-pay

## 2022-03-19 ENCOUNTER — Ambulatory Visit (HOSPITAL_COMMUNITY)
Admission: EM | Admit: 2022-03-19 | Discharge: 2022-03-19 | Disposition: A | Discharge status: Home / Self Care | Payer: Medicare Other | Attending: Family Medicine | Admitting: Family Medicine

## 2022-03-19 DIAGNOSIS — Z1152 Encounter for screening for COVID-19: Secondary | ICD-10-CM | POA: Diagnosis not present

## 2022-03-19 DIAGNOSIS — R059 Cough, unspecified: Secondary | ICD-10-CM | POA: Diagnosis not present

## 2022-03-19 DIAGNOSIS — L03115 Cellulitis of right lower limb: Secondary | ICD-10-CM | POA: Diagnosis present

## 2022-03-19 DIAGNOSIS — J069 Acute upper respiratory infection, unspecified: Secondary | ICD-10-CM | POA: Diagnosis present

## 2022-03-19 MED ORDER — BENZONATATE 100 MG PO CAPS: 100.0000 mg | ORAL_CAPSULE | Freq: Three times a day (TID) | ORAL | 0 refills | Status: DC | PRN

## 2022-03-19 MED ORDER — MUPIROCIN 2 % EX OINT: 1.0000 | TOPICAL_OINTMENT | Freq: Two times a day (BID) | CUTANEOUS | 0 refills | Status: DC

## 2022-03-19 MED ORDER — AMOXICILLIN-POT CLAVULANATE 875-125 MG PO TABS: 1.0000 | ORAL_TABLET | Freq: Two times a day (BID) | ORAL | 0 refills | Status: AC

## 2022-03-19 NOTE — Discharge Instructions (Addendum)
Take amoxicillin-clavulanate 875 mg--1 tab twice daily with food for 7 days, this is the oral antibiotic  Put mupirocin ointment on the sore areas twice daily until improved; this is topical antibiotic.  Take benzonatate 100 mg, 1 tab every 8 hours as needed for cough.   You have been swabbed for COVID, and the test will result in the next 24 hours. Our staff will call you if positive. If the test is positive, you should quarantine for 5 days from the start of your symptoms

## 2022-03-19 NOTE — ED Provider Notes (Addendum)
[place]    CSN: [id number] Arrival date & time: 03/19/22  1057      History   Chief Complaint Chief Complaint  Patient presents with   Cough   Hoarse    HPI Sarah Charles is a 61 y.o. female.    Cough  Here for cough that began on October 24.  It is dry and she is not producing much mucus.  She has had some nasal congestion and just a little bit of rhinorrhea.  No feeling of shortness of breath or congestion in her chest.  No wheezing or tightness in her chest.  No fever or chills or nausea or vomiting or diarrhea.  Yesterday she began noting more swelling in her lower legs than usual and she had red spots show up on her right lower leg yesterday.  She is already taking furosemide daily now for leg edema.  Past Medical History:  Diagnosis Date   Arthritis    Coronary artery calcification seen on CT scan    a. 07/2017 noted on CT abd.   Depression    GERD (gastroesophageal reflux disease)    Hyperlipidemia    Hypertension    Obesity    Sleep apnea    a. Does not use CPAP "cause i dont think I need it anymore".   Tobacco abuse    Type II diabetes mellitus ([place])     Patient Active Problem List   Diagnosis Date Noted   Sleep apnea, unspecified 11/26/2020   CAD (coronary artery disease) 11/22/2017   Chest pain, precordial 11/04/2017   Acute chest pain    Type 2 diabetes mellitus with hyperlipidemia ([place]) 10/29/2017   Unstable angina ([place]) 10/29/2017   Diabetic polyneuropathy associated with type 2 diabetes mellitus ([place]) 05/30/2017   Mixed dyslipidemia 05/30/2017   Depression 05/31/2016   Hypertension 05/31/2016   Uncontrolled type 2 diabetes mellitus with complication 25/09/3974   Uncontrolled type 2 diabetes mellitus with complication, with long-term current use of insulin 04/20/2015   Cellulitis of breast 12/29/2013   Diabetes mellitus ([place]) 12/29/2013   Cellulitis of female breast 12/29/2013   Symptomatic menopausal or female climacteric  states 04/08/2008   OTHER SPECIFIED DISEASE OF NAIL 04/08/2008   URINARY URGENCY 04/08/2008   COUGH 04/02/2008   HYPERTENSION, BENIGN ESSENTIAL 04/12/2007   FATIGUE 04/12/2007   OBESITY, NOS 07/21/2006   Major depressive disorder, recurrent episode ([place]) 07/21/2006   TOBACCO DEPENDENCE 07/21/2006   GASTROESOPHAGEAL REFLUX, NO ESOPHAGITIS 07/21/2006   INCONTINENCE, STRESS, FEMALE 07/21/2006    Past Surgical History:  Procedure Laterality Date   CHOLECYSTECTOMY     CORONARY STENT INTERVENTION N/A 11/04/2017   Procedure: CORONARY STENT INTERVENTION;  Surgeon: Martinique, Peter M, MD;  Location: [place] [facility];  Service: Cardiovascular;  Laterality: N/A;   INCISION AND DRAINAGE ABSCESS Left 12/22/2012   Procedure: INCISION AND DRAINAGE ABSCESS;  Surgeon: Jamesetta So, MD;  Location: [facility];  Service: General;  Laterality: Left;   INTRAVASCULAR PRESSURE WIRE/FFR STUDY N/A 11/04/2017   Procedure: INTRAVASCULAR PRESSURE WIRE/FFR STUDY;  Surgeon: Martinique, Peter M, MD;  Location: [place] [facility];  Service: Cardiovascular;  Laterality: N/A;   LEFT HEART CATH AND CORONARY ANGIOGRAPHY N/A 11/04/2017   Procedure: LEFT HEART CATH AND CORONARY ANGIOGRAPHY;  Surgeon: Martinique, Peter M, MD;  Location: [place] [facility];  Service: Cardiovascular;  Laterality: N/A;   TONSILLECTOMY     TUBAL LIGATION      OB History   No obstetric history on  file.      Home Medications    Prior to Admission medications   Medication Sig Start Date End Date Taking? Authorizing Provider  amoxicillin-clavulanate (AUGMENTIN) 875-125 MG tablet Take 1 tablet by mouth 2 (two) times daily for 7 days. 03/19/22 03/26/22 Yes Barrett Henle, MD  aspirin EC 81 MG tablet Take 81 mg by mouth daily.   Yes [provider]  benzonatate (TESSALON) 100 MG capsule Take 1 capsule (100 mg total) by mouth 3 (three) times daily as needed for cough. 03/19/22  Yes Daquana Paddock, Gwenlyn Perking, MD  Blood Glucose Monitoring Suppl (ONETOUCH  VERIO) w/Device KIT Use to test blood sugar 3 times a day 02/09/22  Yes Newlin, Enobong, MD  buPROPion (WELLBUTRIN SR) 150 MG 12 hr tablet TAKE ONE TABLET BY MOUTH TWICE DAILY 02/15/22  Yes Newlin, Enobong, MD  cetirizine (ZYRTEC) 10 MG tablet TAKE 1 TABLET BY MOUTH EVERY DAY 08/02/19  Yes Newlin, Enobong, MD  clotrimazole (LOTRIMIN) 1 % cream APPLY TO THE AFFECTED AREA(S) beneath BOTH breasts TWICE DAILY 03/08/22  Yes Newlin, Charlane Ferretti, MD  diclofenac Sodium (VOLTAREN) 1 % GEL Apply 4 g topically 4 (four) times daily. 05/05/21  Yes Charlott Rakes, MD  dicyclomine (BENTYL) 10 MG capsule Take 1 capsule (10 mg total) by mouth daily. 02/12/22  Yes Charlott Rakes, MD  fenofibrate (TRICOR) 145 MG tablet Take 1 tablet (145 mg total) by mouth daily. 05/05/21  Yes Newlin, Charlane Ferretti, MD  FLUoxetine (PROZAC) 20 MG capsule TAKE ONE CAPSULE BY MOUTH EVERY MORNING 02/15/22  Yes Newlin, Enobong, MD  furosemide (LASIX) 20 MG tablet Take 1 tablet (20 mg total) by mouth daily. 11/12/21  Yes Newlin, Charlane Ferretti, MD  gabapentin (NEURONTIN) 300 MG capsule TAKE TWO CAPSULES BY MOUTH TWICE DAILY 01/05/22  Yes Newlin, Enobong, MD  glucose blood (ONETOUCH VERIO) test strip Use as instructed 02/09/22  Yes Newlin, Enobong, MD  insulin glargine (LANTUS SOLOSTAR) 100 UNIT/ML Solostar Pen INJECT 50 UNITS into THE SKIN AT BEDTIME 12/11/21  Yes Newlin, Enobong, MD  Insulin Pen Needle (COMFORT EZ PEN NEEDLES) 31G X 5 MM MISC USE AS DIRECTED ONCE DAILY 09/16/21  Yes Charlott Rakes, MD  Lancets (ONETOUCH DELICA PLUS [id number]) MISC Use to test blood sugar 3 times a day 02/09/22  Yes Newlin, Enobong, MD  Lancets Misc. (ACCU-CHEK FASTCLIX LANCET) KIT USE AS DIRECTED 11/12/21  Yes Newlin, Enobong, MD  lidocaine (LIDODERM) 5 % Place 1 patch onto the skin daily. Remove & Discard patch within 12 hours or as directed by MD 02/12/22  Yes Charlott Rakes, MD  lisinopril (ZESTRIL) 40 MG tablet Take 1 tablet (40 mg total) by mouth daily. 02/25/22  Yes Charlott Rakes, MD  metFORMIN (GLUCOPHAGE) 500 MG tablet Take 2 tablets (1,000 mg total) by mouth 2 (two) times daily with a meal. 02/25/22  Yes Newlin, Enobong, MD  metoCLOPramide (REGLAN) 10 MG tablet TAKE 1 TABLET BY MOUTH EVERY EIGHT HOURS AS NEEDED FOR NAUSEA OR VOMITING. 08/14/20  Yes McClung, Angela M, PA-C  metoprolol succinate (TOPROL-XL) 25 MG 24 hr tablet Take 1 tablet (25 mg total) by mouth daily. Take with or immediately following a meal. 02/25/22  Yes Newlin, Enobong, MD  Multiple Vitamin (MULTIVITAMIN) tablet Take 1 tablet by mouth daily.   Yes [provider]  mupirocin ointment (BACTROBAN) 2 % Apply 1 Application topically 2 (two) times daily. To affected area till better 03/19/22  Yes Amyrah Pinkhasov, Gwenlyn Perking, MD  naproxen (NAPROSYN) 375 MG tablet Take 1 tablet (375 mg  total) by mouth 2 (two) times daily with a meal. 07/20/21  Yes Crain, Whitney L, PA  nitroGLYCERIN (NITROSTAT) 0.4 MG SL tablet Place 0.4 mg under the tongue every 5 (five) minutes as needed for chest pain.   Yes [provider]  pantoprazole (PROTONIX) 40 MG tablet TAKE ONE TABLET BY MOUTH ONCE DAILY 01/13/22  Yes Newlin, Enobong, MD  PRALUENT 75 MG/ML SOAJ INJECT 75 MG into THE SKIN every 14 DAYS 06/16/21  Yes Loel Dubonnet, NP  rosuvastatin (CRESTOR) 40 MG tablet TAKE ONE TABLET BY MOUTH EVERY EVENING 01/13/22  Yes Newlin, Enobong, MD  Semaglutide, 1 MG/DOSE, 4 MG/3ML SOPN Inject 1 mg as directed once a week. For 4 weeks then increase to 35m weekly thereafter 02/25/22  Yes Newlin, ECharlane Ferretti MD  Semaglutide, 2 MG/DOSE, 8 MG/3ML SOPN Inject 2 mg as directed once a week. 02/25/22  Yes NCharlott Rakes MD  terbinafine (LAMISIL) 250 MG tablet Please take one a day x 7days, repeat every 4 weeks x 4 months 05/07/21  Yes Regal, NTamala Fothergill DPM  traZODone (DESYREL) 50 MG tablet TAKE ONE TABLET BY MOUTH EVERYDAY AT BEDTIME 02/15/22  Yes Newlin, Enobong, MD  triamcinolone cream (KENALOG) 0.1 % APPLY TO THE AFFECTED AREA(S) ON BOTH  LEGS TWICE DAILY 02/22/22  Yes Newlin, Enobong, MD  vitamin B-12 (CYANOCOBALAMIN) 500 MCG tablet TAKE ONE TABLET BY MOUTH ONCE DAILY 01/13/22  Yes NCharlott Rakes MD  vitamin C (ASCORBIC ACID) 500 MG tablet Take 500 mg by mouth daily.   Yes [provider]    Family History Family History  Problem Relation Age of Onset   Hypertension Mother    CVA Mother    COPD Mother    Heart disease Mother    Kidney Stones Mother    Heart attack Mother        MI in late 558's  Breast cancer Mother    CAD Father    Hypercholesterolemia Father    Heart attack Father        Died @ 552of MI   Diabetes Sister    Cancer Maternal Uncle    Diabetes Maternal Grandmother    Cancer Maternal Grandfather        lung cancer   Colon cancer Neg Hx    Esophageal cancer Neg Hx    Stomach cancer Neg Hx    Pancreatic cancer Neg Hx     Social History Social History   Tobacco Use   Smoking status: Every Day    Packs/day: 1.00    Years: 41.00    Total pack years: 41.00    Types: Cigarettes    Last attempt to quit: 10/07/2018    Years since quitting: 3.4   Smokeless tobacco: Never  Vaping Use   Vaping Use: Never used  Substance Use Topics   Alcohol use: No   Drug use: No     Allergies   Cymbalta [duloxetine hcl]   Review of Systems Review of Systems  Respiratory:  Positive for cough.      Physical Exam Triage Vital Signs ED Triage Vitals  Enc Vitals Group     BP 03/19/22 1235 (!) 159/82     Pulse Rate 03/19/22 1235 64     Resp 03/19/22 1235 16     Temp 03/19/22 1235 98.8 F (37.1 C)     Temp Source 03/19/22 1235 Oral     SpO2 03/19/22 1235 94 %     Weight --  Height --      Head Circumference --      Peak Flow --      Pain Score 03/19/22 1234 0     Pain Loc --      Pain Edu? --      Excl. in [place]? --    No data found.  Updated Vital Signs BP (!) 159/82 (BP Location: Right Arm)   Pulse 64   Temp 98.8 F (37.1 C) (Oral)   Resp 16   SpO2 94%   Visual  Acuity Right Eye Distance:   Left Eye Distance:   Bilateral Distance:    Right Eye Near:   Left Eye Near:    Bilateral Near:     Physical Exam Vitals reviewed.  Constitutional:      General: She is not in acute distress.    Appearance: She is not ill-appearing, toxic-appearing or diaphoretic.  HENT:     Right Ear: Tympanic membrane normal.     Left Ear: Tympanic membrane normal.     Ears:     Comments: There is some cerumen bilaterally in each ear canal    Nose: Congestion present.     Mouth/Throat:     Mouth: Mucous membranes are moist.     Comments: There is clear mucus draining but no erythema Eyes:     Extraocular Movements: Extraocular movements intact.     Conjunctiva/sclera: Conjunctivae normal.     Pupils: Pupils are equal, round, and reactive to light.  Cardiovascular:     Rate and Rhythm: Normal rate and regular rhythm.     Heart sounds: No murmur heard. Pulmonary:     Effort: Pulmonary effort is normal. No respiratory distress.     Breath sounds: No stridor. No wheezing, rhonchi or rales.  Musculoskeletal:     Cervical back: Neck supple.     Comments: There is bilateral 1+ pitting edema.  On the right lower leg there are areas of induration and erythema along the lateral lower leg and onto the anterior shin.  There is no area of fluctuance  Lymphadenopathy:     Cervical: No cervical adenopathy.  Skin:    Coloration: Skin is not pale.  Neurological:     General: No focal deficit present.     Mental Status: She is alert and oriented to person, place, and time.  Psychiatric:        Behavior: Behavior normal.      UC Treatments / Results  Labs (all labs ordered are listed, but only abnormal results are displayed) Labs Reviewed  SARS CORONAVIRUS 2 (TAT 6-24 HRS)    EKG   Radiology No results found.  Procedures Procedures (including critical care time)  Medications Ordered in UC Medications - No data to display  Initial Impression / Assessment  and Plan / UC Course  I have reviewed the triage vital signs and the nursing notes.  Pertinent labs & imaging results that were available during my care of the patient were reviewed by me and considered in my medical decision making (see chart for details).     Going to treat with oral antibiotics for possible cellulitis in her legs.    Tessalon Perles are sent for the cough, and COVID testing is done.  If she is positive for COVID she is a candidate for Paxlovid unless there are medication interactions.  Last EGFR was 97 earlier this month Final Clinical Impressions(s) / UC Diagnoses   Final diagnoses:  Viral URI with cough  Cellulitis of right lower extremity     Discharge Instructions      Take amoxicillin-clavulanate 875 mg--1 tab twice daily with food for 7 days, this is the oral antibiotic  Put mupirocin ointment on the sore areas twice daily until improved; this is topical antibiotic.  Take benzonatate 100 mg, 1 tab every 8 hours as needed for cough.   You have been swabbed for COVID, and the test will result in the next 24 hours. Our staff will call you if positive. If the test is positive, you should quarantine for 5 days from the start of your symptoms      ED Prescriptions     Medication Sig Dispense Auth. Provider   amoxicillin-clavulanate (AUGMENTIN) 875-125 MG tablet Take 1 tablet by mouth 2 (two) times daily for 7 days. 14 tablet Yarieliz Wasser, Gwenlyn Perking, MD   benzonatate (TESSALON) 100 MG capsule Take 1 capsule (100 mg total) by mouth 3 (three) times daily as needed for cough. 21 capsule Barrett Henle, MD   mupirocin ointment (BACTROBAN) 2 % Apply 1 Application topically 2 (two) times daily. To affected area till better 22 g Windy Carina Gwenlyn Perking, MD      PDMP not reviewed this encounter.   Barrett Henle, MD 03/19/22 1302    Barrett Henle, MD 03/19/22 [id number]

## 2022-03-19 NOTE — ED Triage Notes (Signed)
Patient having cough and voice loss for 3 days. Dry cough. No known sick exposure.  Patient has history of a smokers cough. Has been taking Mucinex with no relief.   Patient declines sore throat fever or chills.   Patient also having res spots and swelling in bilateral lower legs. No medical history of issues with the legs. Patient is a controlled diabetic.

## 2022-03-20 LAB — SARS CORONAVIRUS 2 (TAT 6-24 HRS): SARS Coronavirus 2: NEGATIVE

## 2022-03-31 ENCOUNTER — Ambulatory Visit: Payer: Medicare Other | Admitting: Gastroenterology

## 2022-04-01 ENCOUNTER — Other Ambulatory Visit: Payer: Self-pay | Admitting: Family Medicine

## 2022-04-01 NOTE — Telephone Encounter (Signed)
Requested Prescriptions  Pending Prescriptions Disp Refills   lidocaine (LIDODERM) 5 % [Pharmacy Med Name: lidocaine 5 % topical patch] 30 patch 1    Sig: Place ONE PATCH onto THE SKIN daily. REMOVE & Discard PATCH WITHIN 12 hours OR as directed by MD     Analgesics:  Topicals Failed - 04/01/2022  8:01 AM      Failed - Manual Review: Labs are only required if the patient has taken medication for more than 8 weeks.      Passed - PLT in normal range and within 360 days    Platelets  Date Value Ref Range Status  10/19/2021 257 150 - 400 K/uL Final  11/09/2018 321 150 - 450 x10E3/uL Final         Passed - HGB in normal range and within 360 days    Hemoglobin  Date Value Ref Range Status  10/19/2021 12.1 12.0 - 15.0 g/dL Final  11/09/2018 14.2 11.1 - 15.9 g/dL Final         Passed - HCT in normal range and within 360 days    HCT  Date Value Ref Range Status  10/19/2021 36.8 36.0 - 46.0 % Final   Hematocrit  Date Value Ref Range Status  11/09/2018 42.8 34.0 - 46.6 % Final         Passed - Cr in normal range and within 360 days    Creat  Date Value Ref Range Status  07/23/2016 0.50 0.50 - 1.05 mg/dL Final    Comment:      For patients > or = 61 years of age: The upper reference limit for Creatinine is approximately 13% higher for people identified as African-American.      Creatinine, Ser  Date Value Ref Range Status  02/25/2022 0.71 0.57 - 1.00 mg/dL Final   Creatinine, POC  Date Value Ref Range Status  09/16/2016 100 mg/dL Final   Creatinine, Urine  Date Value Ref Range Status  07/23/2016 47 20 - 320 mg/dL Final         Passed - eGFR is 30 or above and within 360 days    GFR, Est African American  Date Value Ref Range Status  07/23/2016 >89 >=60 mL/min Final   GFR calc Af Amer  Date Value Ref Range Status  12/10/2019 >60 >60 mL/min Final   GFR, Est Non African American  Date Value Ref Range Status  07/23/2016 >89 >=60 mL/min Final   GFR, Estimated   Date Value Ref Range Status  10/19/2021 >60 >60 mL/min Final    Comment:    (NOTE) Calculated using the CKD-EPI Creatinine Equation (2021)    eGFR  Date Value Ref Range Status  02/25/2022 97 >59 mL/min/1.73 Final         Passed - Patient is not pregnant      Passed - Valid encounter within last 12 months    Recent Outpatient Visits           1 month ago Type 2 diabetes mellitus with other specified complication, with long-term current use of insulin ([place])   [place], [place], MD   4 months ago Type 2 diabetes mellitus with other specified complication, with long-term current use of insulin ([place])   [place], [place], MD   5 months ago Type 2 diabetes mellitus with other specified complication, with long-term current use of insulin ([place])   [place],  Dionne Bucy, PA-C   7 months ago Localized edema   [facility], Cari S, PA-C   8 months ago Type 2 diabetes mellitus with other specified complication, with long-term current use of insulin ([place])   [place], Charlane Ferretti, MD       Future Appointments             In 1 week Charlott Rakes, MD [place]             vitamin B-12 (CYANOCOBALAMIN) 500 MCG tablet [Pharmacy Med Name: cyanocobalamin (vit B-12) 500 mcg tablet] 30 tablet 2    Sig: TAKE ONE TABLET BY MOUTH ONCE DAILY     Endocrinology:  Vitamins - Vitamin B12 Failed - 04/01/2022  8:01 AM      Failed - B12 Level in normal range and within 360 days    Vitamin B-12  Date Value Ref Range Status  03/23/2018 827 232 - 1,245 pg/mL Final         Passed - HCT in normal range and within 360 days    HCT  Date Value Ref Range Status  10/19/2021 36.8 36.0 - 46.0 % Final   Hematocrit  Date Value Ref Range Status  11/09/2018 42.8 34.0 - 46.6 % Final          Passed - HGB in normal range and within 360 days    Hemoglobin  Date Value Ref Range Status  10/19/2021 12.1 12.0 - 15.0 g/dL Final  11/09/2018 14.2 11.1 - 15.9 g/dL Final         Passed - Valid encounter within last 12 months    Recent Outpatient Visits           1 month ago Type 2 diabetes mellitus with other specified complication, with long-term current use of insulin ([facility])   [place], [place], MD   4 months ago Type 2 diabetes mellitus with other specified complication, with long-term current use of insulin ([place])   [place], [place], MD   5 months ago Type 2 diabetes mellitus with other specified complication, with long-term current use of insulin [facility])   [place], [place], [place]   7 months ago Localized edema   [facility] at [facility], Cari S, PA-C   8 months ago Type 2 diabetes mellitus with other specified complication, with long-term current use of insulin ([place])   [place], [place], MD       Future Appointments             In 1 week Charlott Rakes, MD [place]

## 2022-04-13 ENCOUNTER — Other Ambulatory Visit: Payer: Self-pay | Admitting: Family Medicine

## 2022-04-13 DIAGNOSIS — R21 Rash and other nonspecific skin eruption: Secondary | ICD-10-CM

## 2022-04-13 NOTE — Telephone Encounter (Signed)
Requested medications are due for refill today.  unsure  Requested medications are on the active medications list.  yes  Last refill. 02/22/2022 45g 0 rf  Future visit scheduled.   yes  Notes to clinic.  Refill not delegated.    Requested Prescriptions  Pending Prescriptions Disp Refills   triamcinolone cream (KENALOG) 0.1 % [Pharmacy Med Name: triamcinolone acetonide 0.1 % topical cream] 45 g 0    Sig: APPLY TO THE AFFECTED AREA(S) ON BOTH LEGS TWICE DAILY     Not Delegated - Dermatology:  Corticosteroids Failed - 04/13/2022  4:30 PM      Failed - This refill cannot be delegated      Passed - Valid encounter within last 12 months    Recent Outpatient Visits           1 month ago Type 2 diabetes mellitus with other specified complication, with long-term current use of insulin (HCC)   [facility] [place], [place], MD   5 months ago Type 2 diabetes mellitus with other specified complication, with long-term current use of insulin (HCC)   [facility] [place], [place], MD   6 months ago Type 2 diabetes mellitus with other specified complication, with long-term current use of insulin [facility])   [place] [facility] [place], [place], [place]   7 months ago Localized edema   [facility] at [facility], Cari S, PA-C   8 months ago Type 2 diabetes mellitus with other specified complication, with long-term current use of insulin (HCC)   [facility] [place], Odette Horns, MD       Future Appointments             Tomorrow [name], MD [facility]

## 2022-04-14 ENCOUNTER — Ambulatory Visit: Payer: Medicaid Other | Admitting: Family Medicine

## 2022-04-24 ENCOUNTER — Emergency Department (HOSPITAL_COMMUNITY): Payer: Medicare Other

## 2022-04-24 ENCOUNTER — Other Ambulatory Visit: Payer: Self-pay

## 2022-04-24 ENCOUNTER — Emergency Department (HOSPITAL_COMMUNITY)
Admission: EM | Admit: 2022-04-24 | Discharge: 2022-04-24 | Disposition: A | Discharge status: Home / Self Care | Payer: Medicare Other | Attending: Student | Admitting: Student

## 2022-04-24 ENCOUNTER — Encounter (HOSPITAL_COMMUNITY): Payer: Self-pay

## 2022-04-24 DIAGNOSIS — R531 Weakness: Secondary | ICD-10-CM | POA: Insufficient documentation

## 2022-04-24 DIAGNOSIS — R112 Nausea with vomiting, unspecified: Secondary | ICD-10-CM | POA: Insufficient documentation

## 2022-04-24 DIAGNOSIS — R058 Other specified cough: Secondary | ICD-10-CM | POA: Diagnosis not present

## 2022-04-24 DIAGNOSIS — Z794 Long term (current) use of insulin: Secondary | ICD-10-CM | POA: Diagnosis not present

## 2022-04-24 DIAGNOSIS — Z1152 Encounter for screening for COVID-19: Secondary | ICD-10-CM | POA: Diagnosis not present

## 2022-04-24 DIAGNOSIS — Z5321 Procedure and treatment not carried out due to patient leaving prior to being seen by health care provider: Secondary | ICD-10-CM | POA: Insufficient documentation

## 2022-04-24 DIAGNOSIS — Z7982 Long term (current) use of aspirin: Secondary | ICD-10-CM | POA: Insufficient documentation

## 2022-04-24 DIAGNOSIS — E1142 Type 2 diabetes mellitus with diabetic polyneuropathy: Secondary | ICD-10-CM | POA: Diagnosis not present

## 2022-04-24 DIAGNOSIS — R0789 Other chest pain: Secondary | ICD-10-CM | POA: Diagnosis not present

## 2022-04-24 DIAGNOSIS — N390 Urinary tract infection, site not specified: Secondary | ICD-10-CM

## 2022-04-24 DIAGNOSIS — I2510 Atherosclerotic heart disease of native coronary artery without angina pectoris: Secondary | ICD-10-CM | POA: Insufficient documentation

## 2022-04-24 DIAGNOSIS — R519 Headache, unspecified: Secondary | ICD-10-CM | POA: Insufficient documentation

## 2022-04-24 DIAGNOSIS — I25110 Atherosclerotic heart disease of native coronary artery with unstable angina pectoris: Secondary | ICD-10-CM | POA: Insufficient documentation

## 2022-04-24 DIAGNOSIS — R059 Cough, unspecified: Secondary | ICD-10-CM | POA: Diagnosis not present

## 2022-04-24 DIAGNOSIS — F17210 Nicotine dependence, cigarettes, uncomplicated: Secondary | ICD-10-CM | POA: Diagnosis not present

## 2022-04-24 DIAGNOSIS — Z7984 Long term (current) use of oral hypoglycemic drugs: Secondary | ICD-10-CM | POA: Diagnosis not present

## 2022-04-24 DIAGNOSIS — Z951 Presence of aortocoronary bypass graft: Secondary | ICD-10-CM | POA: Diagnosis not present

## 2022-04-24 DIAGNOSIS — Z79899 Other long term (current) drug therapy: Secondary | ICD-10-CM | POA: Insufficient documentation

## 2022-04-24 DIAGNOSIS — I10 Essential (primary) hypertension: Secondary | ICD-10-CM | POA: Insufficient documentation

## 2022-04-24 LAB — CBC
HCT: 35.9 % — ABNORMAL LOW (ref 36.0–46.0)
Hemoglobin: 12.5 g/dL (ref 12.0–15.0)
MCH: 30.9 pg (ref 26.0–34.0)
MCHC: 34.8 g/dL (ref 30.0–36.0)
MCV: 88.6 fL (ref 80.0–100.0)
Platelets: 208 10*3/uL (ref 150–400)
RBC: 4.05 MIL/uL (ref 3.87–5.11)
RDW: 13 % (ref 11.5–15.5)
WBC: 5.7 10*3/uL (ref 4.0–10.5)
nRBC: 0 % (ref 0.0–0.2)

## 2022-04-24 LAB — URINALYSIS, ROUTINE W REFLEX MICROSCOPIC
Bilirubin Urine: NEGATIVE
Glucose, UA: >=500 mg/dL — AB
Ketones, ur: NEGATIVE mg/dL
Nitrite: NEGATIVE
Protein, ur: NEGATIVE mg/dL
RBC / HPF: >50 RBC/hpf — ABNORMAL HIGH (ref 0–5)
Specific Gravity, Urine: 1.023 (ref 1.005–1.030)
pH: 9 — ABNORMAL HIGH (ref 5.0–8.0)

## 2022-04-24 LAB — COMPREHENSIVE METABOLIC PANEL
ALT: 14 U/L (ref 0–44)
AST: 16 U/L (ref 15–41)
Albumin: 3.1 g/dL — ABNORMAL LOW (ref 3.5–5.0)
Alkaline Phosphatase: 72 U/L (ref 38–126)
Anion gap: 8 (ref 5–15)
BUN: 19 mg/dL (ref 8–23)
CO2: 26 mmol/L (ref 22–32)
Calcium: 9.7 mg/dL (ref 8.9–10.3)
Chloride: 104 mmol/L (ref 98–111)
Creatinine, Ser: 0.67 mg/dL (ref 0.44–1.00)
GFR, Estimated: >60 mL/min (ref >60)
Glucose, Bld: 328 mg/dL — ABNORMAL HIGH (ref 70–99)
Potassium: 4.1 mmol/L (ref 3.5–5.1)
Sodium: 138 mmol/L (ref 135–145)
Total Bilirubin: <0.1 mg/dL — ABNORMAL LOW (ref 0.3–1.2)
Total Protein: 6.5 g/dL (ref 6.5–8.1)

## 2022-04-24 LAB — TROPONIN I (HIGH SENSITIVITY)
Troponin I (High Sensitivity): 5 ng/L (ref <18)
Troponin I (High Sensitivity): 6 ng/L (ref <18)

## 2022-04-24 LAB — BRAIN NATRIURETIC PEPTIDE: B Natriuretic Peptide: 74.6 pg/mL (ref 0.0–100.0)

## 2022-04-24 LAB — RESP PANEL BY RT-PCR (FLU A&B, COVID) ARPGX2
Influenza A by PCR: NEGATIVE
Influenza B by PCR: NEGATIVE
SARS Coronavirus 2 by RT PCR: NEGATIVE

## 2022-04-24 MED ORDER — FUROSEMIDE 20 MG PO TABS
20.0000 mg | ORAL_TABLET | Freq: Every day | ORAL | Status: DC
  Administered 2022-04-24: 20 mg via ORAL
  Filled 2022-04-24: qty 1

## 2022-04-24 MED ORDER — PROCHLORPERAZINE EDISYLATE 10 MG/2ML IJ SOLN
10.0000 mg | Freq: Once | INTRAMUSCULAR | Status: AC
  Administered 2022-04-24: 10 mg via INTRAVENOUS
  Filled 2022-04-24: qty 2

## 2022-04-24 MED ORDER — LORATADINE 10 MG PO TABS
10.0000 mg | ORAL_TABLET | Freq: Every day | ORAL | Status: DC
  Administered 2022-04-24: 10 mg via ORAL
  Filled 2022-04-24: qty 1

## 2022-04-24 MED ORDER — METOPROLOL SUCCINATE ER 25 MG PO TB24
25.0000 mg | ORAL_TABLET | Freq: Every day | ORAL | Status: DC
  Administered 2022-04-24: 25 mg via ORAL
  Filled 2022-04-24: qty 1

## 2022-04-24 MED ORDER — CEFADROXIL 500 MG PO CAPS
500.0000 mg | ORAL_CAPSULE | Freq: Two times a day (BID) | ORAL | Status: DC
  Administered 2022-04-24: 500 mg via ORAL
  Filled 2022-04-24: qty 1

## 2022-04-24 MED ORDER — LISINOPRIL 20 MG PO TABS
40.0000 mg | ORAL_TABLET | Freq: Every day | ORAL | Status: DC
  Administered 2022-04-24: 40 mg via ORAL
  Filled 2022-04-24: qty 2

## 2022-04-24 MED ORDER — DIPHENHYDRAMINE HCL 50 MG/ML IJ SOLN
25.0000 mg | Freq: Once | INTRAMUSCULAR | Status: AC
  Administered 2022-04-24: 25 mg via INTRAVENOUS
  Filled 2022-04-24: qty 1

## 2022-04-24 MED ORDER — IOHEXOL 350 MG/ML SOLN
75.0000 mL | Freq: Once | INTRAVENOUS | Status: AC | PRN
  Administered 2022-04-24: 75 mL via INTRAVENOUS

## 2022-04-24 MED ORDER — LACTATED RINGERS IV BOLUS
1000.0000 mL | Freq: Once | INTRAVENOUS | Status: AC
  Administered 2022-04-24: 1000 mL via INTRAVENOUS

## 2022-04-24 MED ORDER — BUPROPION HCL ER (SR) 150 MG PO TB12
150.0000 mg | ORAL_TABLET | Freq: Two times a day (BID) | ORAL | Status: DC
  Administered 2022-04-24: 150 mg via ORAL
  Filled 2022-04-24: qty 1

## 2022-04-24 MED ORDER — CEFADROXIL 500 MG PO CAPS: 500.0000 mg | ORAL_CAPSULE | Freq: Two times a day (BID) | ORAL | 0 refills | Status: AC

## 2022-04-24 NOTE — ED Notes (Signed)
Discharge instructions reviewed with patient. Patient denies any questions or concerns.   

## 2022-04-24 NOTE — ED Provider Notes (Signed)
[facility] EMERGENCY DEPARTMENT Provider Note  CSN: [id number] Arrival date & time: 04/24/22 1300  Chief Complaint(s) Weakness  HPI Sarah Charles is a 61 y.o. female with PMH CAD, T2DM on insulin, HTN, HLD, tobacco abuse who presents emergency department for evaluation of multiple complaints including headache, nausea, vomiting, generalized weakness and cough.  Patient states that for the last few days she has had upper respiratory and cough like symptoms but had a headache this morning around 7 AM that woke her from sleep with associated nausea and vomiting.  She states that this is the worst headache of her life and had a sense of generalized heaviness immediately following this episode of vomiting.  She states that she still currently has a milder headache and is endorsing some mild chest discomfort but denies numbness, tingling, weakness or other neurologic complaints.  Denies diarrhea, abdominal pain or other systemic symptoms.   Past Medical History Past Medical History:  Diagnosis Date   Arthritis    Coronary artery calcification seen on CT scan    a. 07/2017 noted on CT abd.   Depression    GERD (gastroesophageal reflux disease)    Hyperlipidemia    Hypertension    Obesity    Sleep apnea    a. Does not use CPAP "cause i dont think I need it anymore".   Tobacco abuse    Type II diabetes mellitus ([place])    Patient Active Problem List   Diagnosis Date Noted   Sleep apnea, unspecified 11/26/2020   CAD (coronary artery disease) 11/22/2017   Chest pain, precordial 11/04/2017   Acute chest pain    Type 2 diabetes mellitus with hyperlipidemia ([place]) 10/29/2017   Unstable angina ([place]) 10/29/2017   Diabetic polyneuropathy associated with type 2 diabetes mellitus ([place]) 05/30/2017   Mixed dyslipidemia 05/30/2017   Depression 05/31/2016   Hypertension 05/31/2016   Uncontrolled type 2 diabetes mellitus with complication 78/29/5621   Uncontrolled type 2 diabetes  mellitus with complication, with long-term current use of insulin 04/20/2015   Cellulitis of breast 12/29/2013   Diabetes mellitus ([place]) 12/29/2013   Cellulitis of female breast 12/29/2013   Symptomatic menopausal or female climacteric states 04/08/2008   OTHER SPECIFIED DISEASE OF NAIL 04/08/2008   URINARY URGENCY 04/08/2008   COUGH 04/02/2008   HYPERTENSION, BENIGN ESSENTIAL 04/12/2007   FATIGUE 04/12/2007   OBESITY, NOS 07/21/2006   Major depressive disorder, recurrent episode ([place]) 07/21/2006   TOBACCO DEPENDENCE 07/21/2006   GASTROESOPHAGEAL REFLUX, NO ESOPHAGITIS 07/21/2006   INCONTINENCE, STRESS, FEMALE 07/21/2006   Home Medication(s) Prior to Admission medications   Medication Sig Start Date End Date Taking? Authorizing Provider  aspirin EC 81 MG tablet Take 81 mg by mouth daily.    [provider]  benzonatate (TESSALON) 100 MG capsule Take 1 capsule (100 mg total) by mouth 3 (three) times daily as needed for cough. 03/19/22   Barrett Henle, MD  Blood Glucose Monitoring Suppl [facility] VERIO) w/Device KIT Use to test blood sugar 3 times a day 02/09/22   Charlott Rakes, MD  buPROPion (WELLBUTRIN SR) 150 MG 12 hr tablet TAKE ONE TABLET BY MOUTH TWICE DAILY 02/15/22   Charlott Rakes, MD  cetirizine (ZYRTEC) 10 MG tablet TAKE 1 TABLET BY MOUTH EVERY DAY 08/02/19   Charlott Rakes, MD  clotrimazole (LOTRIMIN) 1 % cream APPLY TO THE AFFECTED AREA(S) beneath BOTH breasts TWICE DAILY 03/08/22   Charlott Rakes, MD  diclofenac Sodium (VOLTAREN) 1 % GEL Apply 4 g topically 4 (  four) times daily. 05/05/21   Charlott Rakes, MD  dicyclomine (BENTYL) 10 MG capsule Take 1 capsule (10 mg total) by mouth daily. 02/12/22   Charlott Rakes, MD  fenofibrate (TRICOR) 145 MG tablet Take 1 tablet (145 mg total) by mouth daily. 05/05/21   Charlott Rakes, MD  FLUoxetine (PROZAC) 20 MG capsule TAKE ONE CAPSULE BY MOUTH EVERY MORNING 02/15/22   Charlott Rakes, MD  furosemide (LASIX) 20 MG  tablet Take 1 tablet (20 mg total) by mouth daily. 11/12/21   Charlott Rakes, MD  gabapentin (NEURONTIN) 300 MG capsule TAKE TWO CAPSULES BY MOUTH TWICE DAILY 01/05/22   Charlott Rakes, MD  glucose blood (ONETOUCH VERIO) test strip Use as instructed 02/09/22   Charlott Rakes, MD  insulin glargine (LANTUS SOLOSTAR) 100 UNIT/ML Solostar Pen INJECT 50 UNITS into THE SKIN AT BEDTIME 12/11/21   Charlott Rakes, MD  Insulin Pen Needle (COMFORT EZ PEN NEEDLES) 31G X 5 MM MISC USE AS DIRECTED ONCE DAILY 09/16/21   Charlott Rakes, MD  Lancets [facility] DELICA PLUS [id number]) MISC Use to test blood sugar 3 times a day 02/09/22   Charlott Rakes, MD  Lancets Misc. (ACCU-CHEK FASTCLIX LANCET) KIT USE AS DIRECTED 11/12/21   Charlott Rakes, MD  lidocaine (LIDODERM) 5 % Place ONE PATCH onto THE SKIN daily. REMOVE & Discard PATCH WITHIN 12 hours OR as directed by MD 04/01/22   Charlott Rakes, MD  lisinopril (ZESTRIL) 40 MG tablet Take 1 tablet (40 mg total) by mouth daily. 02/25/22   Charlott Rakes, MD  metFORMIN (GLUCOPHAGE) 500 MG tablet Take 2 tablets (1,000 mg total) by mouth 2 (two) times daily with a meal. 02/25/22   [place], Charlane Ferretti, MD  metoCLOPramide (REGLAN) 10 MG tablet TAKE 1 TABLET BY MOUTH EVERY EIGHT HOURS AS NEEDED FOR NAUSEA OR VOMITING. 08/14/20   [name], PA-C  metoprolol succinate (TOPROL-XL) 25 MG 24 hr tablet Take 1 tablet (25 mg total) by mouth daily. Take with or immediately following a meal. 02/25/22   Charlott Rakes, MD  Multiple Vitamin (MULTIVITAMIN) tablet Take 1 tablet by mouth daily.    [provider]  mupirocin ointment (BACTROBAN) 2 % Apply 1 Application topically 2 (two) times daily. To affected area till better 03/19/22   Barrett Henle, MD  naproxen (NAPROSYN) 375 MG tablet Take 1 tablet (375 mg total) by mouth 2 (two) times daily with a meal. 07/20/21   [place]rain, Whitney L, PA  nitroGLYCERIN (NITROSTAT) 0.4 MG SL tablet Place 0.4 mg under the tongue every 5 (five)  minutes as needed for chest pain.    [provider]  pantoprazole (PROTONIX) 40 MG tablet TAKE ONE TABLET BY MOUTH ONCE DAILY 01/13/22   Charlott Rakes, MD  PRALUENT 75 MG/ML SOAJ INJECT 75 MG into THE SKIN every 14 DAYS 06/16/21   Loel Dubonnet, NP  rosuvastatin (CRESTOR) 40 MG tablet TAKE ONE TABLET BY MOUTH EVERY EVENING 01/13/22   Charlott Rakes, MD  Semaglutide, 1 MG/DOSE, 4 MG/3ML SOPN Inject 1 mg as directed once a week. For 4 weeks then increase to 25m weekly thereafter 02/25/22   NCharlott Rakes MD  Semaglutide, 2 MG/DOSE, 8 MG/3ML SOPN Inject 2 mg as directed once a week. 02/25/22   NCharlott Rakes MD  terbinafine (LAMISIL) 250 MG tablet Please take one a day x 7days, repeat every 4 weeks x 4 months 05/07/21   RWallene Huh DPM  traZODone (DESYREL) 50 MG tablet TAKE ONE TABLET BY MOUTH EVERYDAY AT BEDTIME 02/15/22  Charlott Rakes, MD  triamcinolone cream (KENALOG) 0.1 % APPLY TO THE AFFECTED AREA(S) ON BOTH LEGS TWICE DAILY 04/13/22   Charlott Rakes, MD  vitamin B-12 (CYANOCOBALAMIN) 500 MCG tablet TAKE ONE TABLET BY MOUTH ONCE DAILY 04/01/22   Charlott Rakes, MD  vitamin C (ASCORBIC ACID) 500 MG tablet Take 500 mg by mouth daily.    [provider]                                                                                                                                    Past Surgical History Past Surgical History:  Procedure Laterality Date   CHOLECYSTECTOMY     CORONARY STENT INTERVENTION N/A 11/04/2017   Procedure: CORONARY STENT INTERVENTION;  Surgeon: Martinique, Peter M, MD;  Location: [facility];  Service: Cardiovascular;  Laterality: N/A;   INCISION AND DRAINAGE ABSCESS Left 12/22/2012   Procedure: INCISION AND DRAINAGE ABSCESS;  Surgeon: Jamesetta So, MD;  Location: [facility];  Service: General;  Laterality: Left;   INTRAVASCULAR PRESSURE WIRE/FFR STUDY N/A 11/04/2017   Procedure: INTRAVASCULAR PRESSURE WIRE/FFR STUDY;  Surgeon: Martinique, Peter  M, MD;  [facility];  Service: Cardiovascular;  Laterality: N/A;   LEFT HEART CATH AND CORONARY ANGIOGRAPHY N/A 11/04/2017   Procedure: LEFT HEART CATH AND CORONARY ANGIOGRAPHY;  Surgeon: Martinique, Peter M, MD;  Location: [facility];  Service: Cardiovascular;  Laterality: N/A;   TONSILLECTOMY     TUBAL LIGATION     Family History Family History  Problem Relation Age of Onset   Hypertension Mother    CVA Mother    COPD Mother    Heart disease Mother    Kidney Stones Mother    Heart attack Mother        MI in late 17's   Breast cancer Mother    CAD Father    Hypercholesterolemia Father    Heart attack Father        Died @ 71 of MI   Diabetes Sister    Cancer Maternal Uncle    Diabetes Maternal Grandmother    Cancer Maternal Grandfather        lung cancer   Colon cancer Neg Hx    Esophageal cancer Neg Hx    Stomach cancer Neg Hx    Pancreatic cancer Neg Hx     Social History Social History   Tobacco Use   Smoking status: Every Day    Packs/day: 1.00    Years: 41.00    Total pack years: 41.00    Types: Cigarettes    Last attempt to quit: 10/07/2018    Years since quitting: 3.5   Smokeless tobacco: Never  Vaping Use   Vaping Use: Never used  Substance Use Topics   Alcohol use: No   Drug use: No   Allergies Cymbalta [duloxetine hcl]  Review of Systems Review of Systems  Gastrointestinal:  Positive for nausea and vomiting.  Neurological:  Positive for weakness and headaches.    Physical Exam Vital Signs  I have reviewed the triage vital signs BP (!) 172/84   Pulse 86   Temp (!) 97.1 F (36.2 C) (Oral)   Resp (!) 26   Ht 5' 3" (1.6 m)   Wt 67.1 kg   SpO2 96%   BMI 26.22 kg/m   Physical Exam Vitals and nursing note reviewed.  Constitutional:      General: She is not in acute distress.    Appearance: She is well-developed.  HENT:     Head: Normocephalic and atraumatic.  Eyes:     Conjunctiva/sclera: Conjunctivae normal.   Cardiovascular:     Rate and Rhythm: Normal rate and regular rhythm.     Heart sounds: No murmur heard. Pulmonary:     Effort: Pulmonary effort is normal. No respiratory distress.     Breath sounds: Normal breath sounds.  Abdominal:     Palpations: Abdomen is soft.     Tenderness: There is no abdominal tenderness.  Musculoskeletal:        General: No swelling.     Cervical back: Neck supple.  Skin:    General: Skin is warm and dry.     Capillary Refill: Capillary refill takes less than 2 seconds.  Neurological:     Mental Status: She is alert.  Psychiatric:        Mood and Affect: Mood normal.     ED Results and Treatments Labs (all labs ordered are listed, but only abnormal results are displayed) Labs Reviewed  CBC - Abnormal; Notable for the following components:      Result Value   HCT 35.9 (*)    All other components within normal limits  COMPREHENSIVE METABOLIC PANEL - Abnormal; Notable for the following components:   Glucose, Bld 328 (*)    Albumin 3.1 (*)    Total Bilirubin <0.1 (*)    All other components within normal limits  RESP PANEL BY RT-PCR (FLU A&B, COVID) ARPGX2  TROPONIN I (HIGH SENSITIVITY)  TROPONIN I (HIGH SENSITIVITY)                                                                                                                          Radiology CT Head Wo Contrast  Result Date: 04/24/2022 CLINICAL DATA:  61 year old female with headache. EXAM: CT HEAD WITHOUT CONTRAST TECHNIQUE: Contiguous axial images were obtained from the base of the skull through the vertex without intravenous contrast. RADIATION DOSE REDUCTION: This exam was performed according to the departmental dose-optimization program which includes automated exposure control, adjustment of the mA and/or kV according to patient size and/or use of iterative reconstruction technique. COMPARISON:  10/08/2020 and prior studies FINDINGS: Brain: No evidence of acute infarction, hemorrhage,  hydrocephalus, extra-axial collection or mass lesion/mass effect. Atrophy and chronic small-vessel white matter ischemic changes again noted. Vascular: Carotid atherosclerotic calcifications are noted. Skull: Normal. Negative for  fracture or focal lesion. Sinuses/Orbits: No acute finding. Other: None. IMPRESSION: 1. No evidence of acute intracranial abnormality. 2. Atrophy and chronic small-vessel white matter ischemic changes. Electronically Signed   By: Margarette Canada M.D.   On: 04/24/2022 14:28    Pertinent labs & imaging results that were available during my care of the patient were reviewed by me and considered in my medical decision making (see MDM for details).  Medications Ordered in ED Medications - No data to display                                                                                                                                   Procedures Procedures  (including critical care time)  Medical Decision Making / ED Course   This patient presents to the ED for concern of headache, fatigue, this involves an extensive number of treatment options, and is a complaint that carries with it a high risk of complications and morbidity.  The differential diagnosis includes tension headache, migraine headache, subarachnoid hemorrhage, electrolyte abnormality, pneumonia, viral URI, UTI  MDM: Patient seen emergency room for evaluation of multiple complaints described above.  Physical exam is largely unremarkable with no focal motor or sensory deficits.  Given patient's concern for worst headache of life outside the 6-hour window for CT Noncon imaging only, patient received CT Noncon followed by a CT angiogram that was reassuringly negative for subarachnoid hemorrhage or aneurysm.  Laboratory workup largely unremarkable but urinalysis is concerning for UTI with leuk esterase, greater than 50 red blood cells, 21-50 white blood cells and rare bacteria.  Patient started on [place].  Chest x-ray  unremarkable.  Patient received migraine cocktail and on reevaluation her symptoms have completely resolved.  At this time, she does not meet inpatient criteria and she is safe for discharge with outpatient follow-up and prescription for Duricef.  She was given tricked return precautions which she voiced understanding and patient discharged with outpatient follow-up   Additional history obtained:  -External records from outside source obtained and reviewed including: Chart review including previous notes, labs, imaging, consultation notes   Lab Tests: -I ordered, reviewed, and interpreted labs.   The pertinent results include:   Labs Reviewed  CBC - Abnormal; Notable for the following components:      Result Value   HCT 35.9 (*)    All other components within normal limits  COMPREHENSIVE METABOLIC PANEL - Abnormal; Notable for the following components:   Glucose, Bld 328 (*)    Albumin 3.1 (*)    Total Bilirubin <0.1 (*)    All other components within normal limits  RESP PANEL BY RT-PCR (FLU A&B, COVID) ARPGX2  TROPONIN I (HIGH SENSITIVITY)  TROPONIN I (HIGH SENSITIVITY)      EKG   EKG Interpretation  Date/Time:  Saturday April 24 2022 13:45:55 EST Ventricular Rate:  79 PR Interval:  170 QRS Duration: 94 QT Interval:  378 QTC  Calculation: 433 R Axis:   12 Text Interpretation: Normal sinus rhythm When compared with ECG of 08-Oct-2020 12:28, PREVIOUS ECG IS PRESENT Confirmed by Kwanza Cancelliere ([id number]) on 04/24/2022 7:51:57 PM         Imaging Studies ordered: I ordered imaging studies including chest x-ray, CT head, CT angio brain I independently visualized and interpreted imaging. I agree with the radiologist interpretation   Medicines ordered and prescription drug management: No orders of the defined types were placed in this encounter.   -I have reviewed the patients home medicines and have made adjustments as needed  Critical interventions none   Cardiac  Monitoring: The patient was maintained on a cardiac monitor.  I personally viewed and interpreted the cardiac monitored which showed an underlying rhythm of: NSR  Social Determinants of Health:  Factors impacting patients care include: none   Reevaluation: After the interventions noted above, I reevaluated the patient and found that they have :improved  Co morbidities that complicate the patient evaluation  Past Medical History:  Diagnosis Date   Arthritis    Coronary artery calcification seen on CT scan    a. 07/2017 noted on CT abd.   Depression    GERD (gastroesophageal reflux disease)    Hyperlipidemia    Hypertension    Obesity    Sleep apnea    a. Does not use CPAP "cause i dont think I need it anymore".   Tobacco abuse    Type II diabetes mellitus ([place])       Dispostion: I considered admission for this patient, but she does not meet inpatient criteria for admission and she is safe for discharge with outpatient follow-up     Final Clinical Impression(s) / ED Diagnoses Final diagnoses:  None     _0 @    Teressa Lower, MD 04/24/22 1952

## 2022-04-24 NOTE — ED Provider Triage Note (Signed)
Emergency Medicine Provider Triage Evaluation Note  Sarah Charles , a 61 y.o. female  was evaluated in triage.  Pt complains of generalized weakness, tingling in both arms, headache. Headache came on suddenly at 11:30am. Cough and congestion x 1 week, felt like she had a fever. Been taking tylenol. Had some chest discomfort this morning when she woke up but this has resolved.  Review of Systems  Positive: As above Negative: Vomiting, diarrhea, blurry vision  Physical Exam  BP (!) 172/81 (BP Location: Right Arm)   Pulse 83   Temp (!) 97.1 F (36.2 C) (Oral)   Resp 18   Ht 5\' 3"  (1.6 m)   Wt 67.1 kg   SpO2 98%   BMI 26.22 kg/m  Gen:   Awake, no distress   Resp:  Normal effort  MSK:   Moves extremities without difficulty  Other:  Normal speech, PERRLA, EOMI, 5/5 strength in all extremities, no facial droop, no pronator drift  Medical Decision Making  Medically screening exam initiated at 1:30 PM.  Appropriate orders placed.  Sarah Charles was informed that the remainder of the evaluation will be completed by another provider, this initial triage assessment does not replace that evaluation, and the importance of remaining in the ED until their evaluation is complete.  Workup initiated   [name], PA-C 04/24/22 1334

## 2022-04-24 NOTE — ED Triage Notes (Signed)
Pt arrived POV from home c/o feeling bad. Pt states she woke up this morning and smoked a cigarette and then started feeling weak like she had too much weight on her and could not hold herself up, pt also states she feels like she hasn't had no sleep even tho she has.

## 2022-05-10 ENCOUNTER — Other Ambulatory Visit: Payer: Self-pay | Admitting: Family Medicine

## 2022-05-10 DIAGNOSIS — R21 Rash and other nonspecific skin eruption: Secondary | ICD-10-CM

## 2022-05-10 NOTE — Telephone Encounter (Signed)
Requested medication (s) are due for refill today: review  Requested medication (s) are on the active medication list: yes  Last refill:  04/13/22 #45g/0  Future visit scheduled: no  Notes to clinic:  Unable to refill per protocol, cannot delegate.      Requested Prescriptions  Pending Prescriptions Disp Refills   triamcinolone cream (KENALOG) 0.1 % [Pharmacy Med Name: triamcinolone acetonide 0.1 % topical cream] 45 g 0    Sig: APPLY TO THE AFFECTED AREA(S) ON BOTH LEGS TWICE DAILY     Not Delegated - Dermatology:  Corticosteroids Failed - 05/10/2022  2:16 PM      Failed - This refill cannot be delegated      Passed - Valid encounter within last 12 months    Recent Outpatient Visits           2 months ago Type 2 diabetes mellitus with other specified complication, with long-term current use of insulin (HCC)   [facility] [place], [place], [place]   5 months ago Type 2 diabetes mellitus with other specified complication, with long-term current use of insulin (HCC)   [facility] [place], [place], [place]   7 months ago Type 2 diabetes mellitus with other specified complication, with long-term current use of insulin [facility])   [facility] [place], [place], [place]   8 months ago Localized edema   [facility], Cari S, PA-C   9 months ago Type 2 diabetes mellitus with other specified complication, with long-term current use of insulin [facility])   [place] [facility] [name], MD

## 2022-05-13 ENCOUNTER — Other Ambulatory Visit: Payer: Self-pay | Admitting: Family Medicine

## 2022-05-13 DIAGNOSIS — E1169 Type 2 diabetes mellitus with other specified complication: Secondary | ICD-10-CM

## 2022-05-13 DIAGNOSIS — F32A Depression, unspecified: Secondary | ICD-10-CM

## 2022-05-27 ENCOUNTER — Other Ambulatory Visit: Payer: Self-pay | Admitting: Nurse Practitioner

## 2022-05-27 DIAGNOSIS — Z1231 Encounter for screening mammogram for malignant neoplasm of breast: Secondary | ICD-10-CM

## 2022-05-28 LAB — COLOGUARD

## 2022-06-04 ENCOUNTER — Ambulatory Visit: Payer: Self-pay

## 2022-06-04 ENCOUNTER — Telehealth: Payer: Self-pay | Admitting: Family Medicine

## 2022-06-04 NOTE — Telephone Encounter (Signed)
Pt has been scheduled for a telephone visit with provider to discuss concerns.

## 2022-06-04 NOTE — Telephone Encounter (Signed)
Attempted to contact patient to schedule an appointment to brig in urine sample VM was left.

## 2022-06-04 NOTE — Telephone Encounter (Signed)
Copied from [place] [phone]. Topic: General - Other >> Jun 04, 2022  2:56 PM Oley Balm A wrote: Reason for CRM: Patient is returning phone call CMA left on her voice mail to return a call to the office to have an appointment scheduled to drop off a urine sample.  Please call patient back

## 2022-06-04 NOTE — Telephone Encounter (Signed)
Summary: urinary discomfort   The patient has experienced increased urinary frequency for roughly three weeks  The patient shares that they are using the restroom more than hourly  The patient has noticed no odor or discharge  The patient would like to speak with a member of clinical staff further when possible  Please contact further      Called pt - LMOMTCB 

## 2022-06-04 NOTE — Telephone Encounter (Signed)
Summary: urinary discomfort   The patient has experienced increased urinary frequency for roughly three weeks  The patient shares that they are using the restroom more than hourly  The patient has noticed no odor or discharge  The patient would like to speak with a member of clinical staff further when possible  Please contact further        Called pt - Phone was picked up and hung up.

## 2022-06-04 NOTE — Telephone Encounter (Signed)
Summary: urinary discomfort   The patient has experienced increased urinary frequency for roughly three weeks  The patient shares that they are using the restroom more than hourly  The patient has noticed no odor or discharge  The patient would like to speak with a member of clinical staff further when possible  Please contact further      Called pt - LMOMTCB

## 2022-06-08 ENCOUNTER — Telehealth: Payer: Self-pay | Admitting: Family Medicine

## 2022-06-08 ENCOUNTER — Ambulatory Visit (HOSPITAL_BASED_OUTPATIENT_CLINIC_OR_DEPARTMENT_OTHER): Payer: 59 | Admitting: Family Medicine

## 2022-06-08 ENCOUNTER — Encounter: Payer: Self-pay | Admitting: Family Medicine

## 2022-06-08 ENCOUNTER — Ambulatory Visit: Payer: Self-pay

## 2022-06-08 DIAGNOSIS — B3731 Acute candidiasis of vulva and vagina: Secondary | ICD-10-CM

## 2022-06-08 DIAGNOSIS — R350 Frequency of micturition: Secondary | ICD-10-CM | POA: Diagnosis not present

## 2022-06-08 MED ORDER — FLUCONAZOLE 150 MG PO TABS: 150.0000 mg | ORAL_TABLET | Freq: Once | ORAL | 0 refills | Status: AC

## 2022-06-08 NOTE — Progress Notes (Signed)
Virtual Visit via Telephone Note  I connected with [name], on 06/08/2022 at 11:30 AM by telephone and verified that I am speaking with the correct person using two identifiers.   Consent: I discussed the limitations, risks, security and privacy concerns of performing an evaluation and management service by telephone and the availability of in person appointments. I also discussed with the patient that there may be a patient responsible charge related to this service. The patient expressed understanding and agreed to proceed.   Location of Patient: Home  Location of Provider: Clinic   Persons participating in Telemedicine visit: [name] Dr. Alvis Lemmings     History of Present Illness: Sarah Charles is a 62 y.o. year old female with  type 2 diabetes mellitus (A1c 7.4), hypertension, hyperlipidemia, GERD, CAD (s/p DES).    She Complains of urinary frequency, vaginal itching but no dysuria for 1 week. She has no abdominal pain or flank pain, no nausea, vomiting. Denies the presence of vaginal discharge.  She has not used any OTC medications for this.  Past Medical History:  Diagnosis Date   Arthritis    Coronary artery calcification seen on CT scan    a. 07/2017 noted on CT abd.   Depression    GERD (gastroesophageal reflux disease)    Hyperlipidemia    Hypertension    Obesity    Sleep apnea    a. Does not use CPAP "cause i dont think I need it anymore".   Tobacco abuse    Type II diabetes mellitus (HCC)    Allergies  Allergen Reactions   Cymbalta [Duloxetine Hcl] Nausea Only    Nausea, lack of therapeutic effect    Current Outpatient Medications on File Prior to Visit  Medication Sig Dispense Refill   aspirin EC 81 MG tablet Take 81 mg by mouth daily.     benzonatate (TESSALON) 100 MG capsule Take 1 capsule (100 mg total) by mouth 3 (three) times daily as needed for cough. 21 capsule 0   Blood Glucose Monitoring Suppl (ONETOUCH VERIO) w/Device KIT Use to test blood  sugar 3 times a day 1 kit 0   buPROPion (WELLBUTRIN SR) 150 MG 12 hr tablet TAKE ONE TABLET BY MOUTH TWICE DAILY 180 tablet 0   cetirizine (ZYRTEC) 10 MG tablet TAKE 1 TABLET BY MOUTH EVERY DAY 30 tablet 2   clotrimazole (LOTRIMIN) 1 % cream APPLY TO THE AFFECTED AREA(S) beneath BOTH breasts TWICE DAILY 60 g 1   diclofenac Sodium (VOLTAREN) 1 % GEL Apply 4 g topically 4 (four) times daily. 100 g 1   dicyclomine (BENTYL) 10 MG capsule Take 1 capsule (10 mg total) by mouth daily. 30 capsule 1   fenofibrate (TRICOR) 145 MG tablet TAKE ONE TABLET BY MOUTH EVERY EVENING 90 tablet 0   FLUoxetine (PROZAC) 20 MG capsule TAKE ONE TABLET BY MOUTH EVERY MORNING 90 capsule 0   furosemide (LASIX) 20 MG tablet Take 1 tablet (20 mg total) by mouth daily. 90 tablet 1   gabapentin (NEURONTIN) 300 MG capsule TAKE TWO CAPSULES BY MOUTH TWICE DAILY 360 capsule 1   glucose blood (ONETOUCH VERIO) test strip Use as instructed 100 each 12   insulin glargine (LANTUS SOLOSTAR) 100 UNIT/ML Solostar Pen INJECT 50 UNITS into THE SKIN AT BEDTIME 45 mL 1   Insulin Pen Needle (COMFORT EZ PEN NEEDLES) 31G X 5 MM MISC USE AS DIRECTED ONCE DAILY 100 each 0   Lancets (ONETOUCH DELICA PLUS LANCET33G) MISC Use to  test blood sugar 3 times a day 100 each 12   Lancets Misc. (ACCU-CHEK FASTCLIX LANCET) KIT USE AS DIRECTED 1 kit 11   lidocaine (LIDODERM) 5 % Place ONE PATCH onto THE SKIN daily. REMOVE & Discard PATCH WITHIN 12 hours OR as directed by MD 30 patch 1   lisinopril (ZESTRIL) 40 MG tablet Take 1 tablet (40 mg total) by mouth daily. 90 tablet 1   metFORMIN (GLUCOPHAGE) 500 MG tablet Take 2 tablets (1,000 mg total) by mouth 2 (two) times daily with a meal. 360 tablet 1   metoCLOPramide (REGLAN) 10 MG tablet TAKE 1 TABLET BY MOUTH EVERY EIGHT HOURS AS NEEDED FOR NAUSEA OR VOMITING. 40 tablet 0   metoprolol succinate (TOPROL-XL) 25 MG 24 hr tablet Take 1 tablet (25 mg total) by mouth daily. Take with or immediately following a meal.  90 tablet 1   Multiple Vitamin (MULTIVITAMIN) tablet Take 1 tablet by mouth daily.     mupirocin ointment (BACTROBAN) 2 % Apply 1 Application topically 2 (two) times daily. To affected area till better 22 g 0   naproxen (NAPROSYN) 375 MG tablet Take 1 tablet (375 mg total) by mouth 2 (two) times daily with a meal. 20 tablet 0   nitroGLYCERIN (NITROSTAT) 0.4 MG SL tablet Place 0.4 mg under the tongue every 5 (five) minutes as needed for chest pain.     pantoprazole (PROTONIX) 40 MG tablet TAKE ONE TABLET BY MOUTH ONCE DAILY 90 tablet 1   PRALUENT 75 MG/ML SOAJ INJECT 75 MG into THE SKIN every 14 DAYS 2 mL 11   rosuvastatin (CRESTOR) 40 MG tablet TAKE ONE TABLET BY MOUTH EVERY EVENING 90 tablet 1   Semaglutide, 1 MG/DOSE, 4 MG/3ML SOPN Inject 1 mg as directed once a week. For 4 weeks then increase to 2mg  weekly thereafter 3 mL 0   Semaglutide, 2 MG/DOSE, 8 MG/3ML SOPN Inject 2 mg as directed once a week. 3 mL 6   terbinafine (LAMISIL) 250 MG tablet Please take one a day x 7days, repeat every 4 weeks x 4 months 28 tablet 0   traZODone (DESYREL) 50 MG tablet TAKE ONE TABLET BY MOUTH EVERYDAY AT BEDTIME 90 tablet 0   triamcinolone cream (KENALOG) 0.1 % APPLY TO THE AFFECTED AREA(S) ON BOTH LEGS TWICE DAILY 45 g 0   vitamin B-12 (CYANOCOBALAMIN) 500 MCG tablet TAKE ONE TABLET BY MOUTH ONCE DAILY 30 tablet 2   vitamin C (ASCORBIC ACID) 500 MG tablet Take 500 mg by mouth daily.     No current facility-administered medications on file prior to visit.    ROS: See HPI  Observations/Objective: Awake, alert, oriented x3 Not in acute distress Normal mood      Latest Ref Rng & Units 04/24/2022    1:39 PM 02/25/2022   10:53 AM 10/20/2021    9:07 AM  CMP  Glucose 70 - 99 mg/dL 328  101  87   BUN 8 - 23 mg/dL 19  20  23    Creatinine 0.44 - 1.00 mg/dL 0.67  0.71  0.67   Sodium 135 - 145 mmol/L 138  140  142   Potassium 3.5 - 5.1 mmol/L 4.1  4.1  4.2   Chloride 98 - 111 mmol/L 104  100  104   CO2 22  - 32 mmol/L 26  24  26    Calcium 8.9 - 10.3 mg/dL 9.7  9.4  9.9   Total Protein 6.5 - 8.1 g/dL 6.5  6.7  Total Bilirubin 0.3 - 1.2 mg/dL <0.1  <0.2    Alkaline Phos 38 - 126 U/L 72  77    AST 15 - 41 U/L 16  19    ALT 0 - 44 U/L 14  18      Lipid Panel     Component Value Date/Time   CHOL 137 11/12/2021 1018   TRIG 102 11/12/2021 1018   HDL 54 11/12/2021 1018   CHOLHDL 3.9 08/14/2020 1124   CHOLHDL 9.7 (H) 07/23/2016 1638   VLDL NOT CALC 07/23/2016 1638   LDLCALC 64 11/12/2021 1018   LABVLDL 19 11/12/2021 1018    Lab Results  Component Value Date   HGBA1C 7.4 (A) 02/25/2022    Assessment and Plan: 1. Vaginal candidiasis Will treat with Diflucan presumptively and if symptoms do not improve she will need to come into the office for the cervical vaginal swab - fluconazole (DIFLUCAN) 150 MG tablet; Take 1 tablet (150 mg total) by mouth once for 1 dose. Repeat in 72 hours  Dispense: 2 tablet; Refill: 0  2. Urinary frequency She does not have dysuria or other UTI symptoms hence I will hold off on presumptive treating Given history of diabetes will need to exclude hyperglycemia Advised to schedule in person visit for chronic disease management we can send it for urine at that visit.   Follow Up Instructions: Advised to call to schedule appointment for chronic disease management   I discussed the assessment and treatment plan with the patient. The patient was provided an opportunity to ask questions and all were answered. The patient agreed with the plan and demonstrated an understanding of the instructions.   The patient was advised to call back or seek an in-person evaluation if the symptoms worsen or if the condition fails to improve as anticipated.     I provided 11 minutes total of non-face-to-face time during this encounter.   Charlott Rakes, MD, FAAFP. [facility] [place], [place]   06/08/2022, 11:30 AM

## 2022-06-08 NOTE — Telephone Encounter (Signed)
Spoke with patient to let her know that PCP will be returning her call. Asked patient to stay by her device to receive call so she will not miss call again.  Patient voiced understanding of all discussed .

## 2022-06-08 NOTE — Telephone Encounter (Signed)
Summary: frequent urination and itching   Patient called in states has itching and frequent urination, no office appt until March. Please call back for further assistance      Called pt - Left message on machine to call back. 

## 2022-06-08 NOTE — Telephone Encounter (Signed)
Patient was given appointment .

## 2022-06-08 NOTE — Telephone Encounter (Signed)
Summary: frequent urination and itching   Patient called in states has itching and frequent urination, no office appt until March. Please call back for further assistance      Called pt - Left message on machine to call back.

## 2022-06-08 NOTE — Telephone Encounter (Signed)
Copied from [place] [id number]. Topic: Appointment Scheduling - Scheduling Inquiry for Clinic >> Jun 08, 2022  9:33 AM Erskine Squibb wrote: Reason for CRM: The patient called in stating she missed her call this morning and was instructed to call into the office. She is not sure what to do at this point and states she doesn't know what to pee in to leave a urine sample. Please assist patient further

## 2022-06-08 NOTE — Telephone Encounter (Signed)
Copied from CRM #[id number]. Topic: Appointment Scheduling - Scheduling Inquiry for Clinic >> Jun 08, 2022  9:33 AM Jason S wrote: Reason for CRM: The patient called in stating she missed her call this morning and was instructed to call into the office. She is not sure what to do at this point and states she doesn't know what to pee in to leave a urine sample. Please assist patient further 

## 2022-06-08 NOTE — Progress Notes (Signed)
Patient called to his afternoon voicing that her urinary s/s have worsening and requesting in-person visit. Appointment obtained with M.Edwards, NP for 01/17/204.

## 2022-06-08 NOTE — Telephone Encounter (Signed)
Summary: frequent urination and itching   Patient called in states has itching and frequent urination, no office appt until March. Please call back for further assistance     Called pt - LMOMTCB

## 2022-06-09 ENCOUNTER — Telehealth: Payer: Self-pay | Admitting: Emergency Medicine

## 2022-06-09 ENCOUNTER — Other Ambulatory Visit: Payer: Self-pay | Admitting: Family Medicine

## 2022-06-09 ENCOUNTER — Ambulatory Visit (INDEPENDENT_AMBULATORY_CARE_PROVIDER_SITE_OTHER): Payer: Medicaid Other | Admitting: Primary Care

## 2022-06-09 DIAGNOSIS — E1169 Type 2 diabetes mellitus with other specified complication: Secondary | ICD-10-CM

## 2022-06-09 NOTE — Telephone Encounter (Signed)
Copied from [place] [phone]. Topic: General - Other >> Jun 09, 2022 12:00 PM Sarah Charles wrote: Reason for CRM: Patient called in states wont be able to make appt today in [place] that she says Dr Margarita Rana referred her too, because she is waiting on med that Dr Evie Lacks [place]

## 2022-06-09 NOTE — Telephone Encounter (Signed)
Requested Prescriptions  Pending Prescriptions Disp Refills   liraglutide (VICTOZA) 18 MG/3ML SOPN [Pharmacy Med Name: Victoza 2-Pak 0.6 mg/0.1 mL (18 mg/3 mL) subcutaneous pen injector] 6 mL 6    Sig: INJECT 1.2 MG SUBCUTANEOUSLY ONCE DAILY     Endocrinology:  Diabetes - GLP-1 Receptor Agonists Passed - 06/09/2022  2:06 PM      Passed - HBA1C is between 0 and 7.9 and within 180 days    HbA1c, POC (controlled diabetic range)  Date Value Ref Range Status  02/25/2022 7.4 (A) 0.0 - 7.0 % Final         Passed - Valid encounter within last 6 months    Recent Outpatient Visits           Yesterday Vaginal candidiasis   [place], Charlane Ferretti, MD   3 months ago Type 2 diabetes mellitus with other specified complication, with long-term current use of insulin ([place])   [place], [place], MD   6 months ago Type 2 diabetes mellitus with other specified complication, with long-term current use of insulin ([place])   [place], [place], MD   8 months ago Type 2 diabetes mellitus with other specified complication, with long-term current use of insulin ([place])   [place], Levada Dy M, [place]   9 months ago Localized edema   [facility], Cari S, PA-C               LANTUS SOLOSTAR 100 UNIT/ML Solostar [employer] Med Name: Lantus Solostar U-100 Insulin 100 unit/mL (3 mL) subcutaneous pen] 45 mL 6    Sig: Inject 50 units into THE SKIN AT bedtime     Endocrinology:  Diabetes - Insulins Passed - 06/09/2022  2:06 PM      Passed - HBA1C is between 0 and 7.9 and within 180 days    HbA1c, POC (controlled diabetic range)  Date Value Ref Range Status  02/25/2022 7.4 (A) 0.0 - 7.0 % Final         Passed - Valid encounter within last 6 months    Recent Outpatient Visits           Yesterday Vaginal candidiasis   [place], [place], MD   3 months ago Type 2 diabetes mellitus with other specified complication, with long-term current use of insulin ([place])   [place], [place], MD   6 months ago Type 2 diabetes mellitus with other specified complication, with long-term current use of insulin ([place])   [place], [place], MD   8 months ago Type 2 diabetes mellitus with other specified complication, with long-term current use of insulin [facility])   [place], [place], [place]   9 months ago Localized edema   [facility] at [place], PA-C               lidocaine (LIDODERM) 5 % [Pharmacy Med Name: lidocaine 5 % topical patch] 30 patch 6    Sig: Place ONE PATCH onto THE SKIN daily. REMOVE & Discard PATCH WITHIN 12 hours OR as directed by MD     Analgesics:  Topicals Failed - 06/09/2022  2:06 PM      Failed - Manual Review: Labs are only required if the patient has  taken medication for more than 8 weeks.      Failed - HCT in normal range and within 360 days    HCT  Date Value Ref Range Status  04/24/2022 35.9 (L) 36.0 - 46.0 % Final   Hematocrit  Date Value Ref Range Status  11/09/2018 42.8 34.0 - 46.6 % Final         Passed - PLT in normal range and within 360 days    Platelets  Date Value Ref Range Status  04/24/2022 208 150 - 400 K/uL Final  11/09/2018 321 150 - 450 x10E3/uL Final         Passed - HGB in normal range and within 360 days    Hemoglobin  Date Value Ref Range Status  04/24/2022 12.5 12.0 - 15.0 g/dL Final  11/09/2018 14.2 11.1 - 15.9 g/dL Final         Passed - Cr in normal range and within 360 days    Creat  Date Value Ref Range Status  07/23/2016 0.50 0.50 - 1.05 mg/dL Final    Comment:      For patients > or = 62 years of age: The upper reference limit for Creatinine is approximately 13% higher for people  identified as African-American.      Creatinine, Ser  Date Value Ref Range Status  04/24/2022 0.67 0.44 - 1.00 mg/dL Final   Creatinine, POC  Date Value Ref Range Status  09/16/2016 100 mg/dL Final   Creatinine, Urine  Date Value Ref Range Status  07/23/2016 47 20 - 320 mg/dL Final         Passed - eGFR is 30 or above and within 360 days    GFR, Est African American  Date Value Ref Range Status  07/23/2016 >89 >=60 mL/min Final   GFR calc Af Amer  Date Value Ref Range Status  12/10/2019 >60 >60 mL/min Final   GFR, Est Non African American  Date Value Ref Range Status  07/23/2016 >89 >=60 mL/min Final   GFR, Estimated  Date Value Ref Range Status  04/24/2022 >60 >60 mL/min Final    Comment:    (NOTE) Calculated using the CKD-EPI Creatinine Equation (2021)    eGFR  Date Value Ref Range Status  02/25/2022 97 >59 mL/min/1.73 Final         Passed - Patient is not pregnant      Passed - Valid encounter within last 12 months    Recent Outpatient Visits           Yesterday Vaginal candidiasis   [place], [place], MD   3 months ago Type 2 diabetes mellitus with other specified complication, with long-term current use of insulin ([place])   [place], [place], MD   6 months ago Type 2 diabetes mellitus with other specified complication, with long-term current use of insulin ([place])   [place], [place], MD   8 months ago Type 2 diabetes mellitus with other specified complication, with long-term current use of insulin [facility])   [place], [place], [place]   9 months ago Localized edema   [facility] at [facility], [place], [place]

## 2022-06-09 NOTE — Telephone Encounter (Signed)
She will need UA dipstick and wet prep.

## 2022-06-09 NOTE — Telephone Encounter (Signed)
Requested by interface surescripts. Medication discontinued 02/25/22.  Requested Prescriptions  Signed Prescriptions Disp Refills   LANTUS SOLOSTAR 100 UNIT/ML Solostar Pen 45 mL 6    Sig: Inject 50 units into THE SKIN AT bedtime     Endocrinology:  Diabetes - Insulins Passed - 06/09/2022  2:06 PM      Passed - HBA1C is between 0 and 7.9 and within 180 days    HbA1c, POC (controlled diabetic range)  Date Value Ref Range Status  02/25/2022 7.4 (A) 0.0 - 7.0 % Final         Passed - Valid encounter within last 6 months    Recent Outpatient Visits           Yesterday Vaginal candidiasis   [place], [place], MD   3 months ago Type 2 diabetes mellitus with other specified complication, with long-term current use of insulin ([place])   Kemp, [place], MD   6 months ago Type 2 diabetes mellitus with other specified complication, with long-term current use of insulin ([place])   [place], [place], MD   8 months ago Type 2 diabetes mellitus with other specified complication, with long-term current use of insulin [facility])   [place], [place], [place]   9 months ago Localized edema   [facility] at [facility], Cari S, PA-C               lidocaine (LIDODERM) 5 % 30 patch 6    Sig: Place ONE PATCH onto THE SKIN daily. REMOVE & Discard PATCH WITHIN 12 hours OR as directed by MD     Analgesics:  Topicals Failed - 06/09/2022  2:06 PM      Failed - Manual Review: Labs are only required if the patient has taken medication for more than 8 weeks.      Failed - HCT in normal range and within 360 days    HCT  Date Value Ref Range Status  04/24/2022 35.9 (L) 36.0 - 46.0 % Final   Hematocrit  Date Value Ref Range Status  11/09/2018 42.8 34.0 - 46.6 % Final         Passed - PLT in normal range and within 360 days    Platelets   Date Value Ref Range Status  04/24/2022 208 150 - 400 K/uL Final  11/09/2018 321 150 - 450 x10E3/uL Final         Passed - HGB in normal range and within 360 days    Hemoglobin  Date Value Ref Range Status  04/24/2022 12.5 12.0 - 15.0 g/dL Final  11/09/2018 14.2 11.1 - 15.9 g/dL Final         Passed - Cr in normal range and within 360 days    Creat  Date Value Ref Range Status  07/23/2016 0.50 0.50 - 1.05 mg/dL Final    Comment:      For patients > or = 62 years of age: The upper reference limit for Creatinine is approximately 13% higher for people identified as African-American.      Creatinine, Ser  Date Value Ref Range Status  04/24/2022 0.67 0.44 - 1.00 mg/dL Final   Creatinine, POC  Date Value Ref Range Status  09/16/2016 100 mg/dL Final   Creatinine, Urine  Date Value Ref Range Status  07/23/2016 47 20 - 320 mg/dL Final  Passed - eGFR is 30 or above and within 360 days    GFR, Est African American  Date Value Ref Range Status  07/23/2016 >89 >=60 mL/min Final   GFR calc Af Amer  Date Value Ref Range Status  12/10/2019 >60 >60 mL/min Final   GFR, Est Non African American  Date Value Ref Range Status  07/23/2016 >89 >=60 mL/min Final   GFR, Estimated  Date Value Ref Range Status  04/24/2022 >60 >60 mL/min Final    Comment:    (NOTE) Calculated using the CKD-EPI Creatinine Equation (2021)    eGFR  Date Value Ref Range Status  02/25/2022 97 >59 mL/min/1.73 Final         Passed - Patient is not pregnant      Passed - Valid encounter within last 12 months    Recent Outpatient Visits           Yesterday Vaginal candidiasis   [place], Charlane Ferretti, MD   3 months ago Type 2 diabetes mellitus with other specified complication, with long-term current use of insulin ([place])   [place], Charlane Ferretti, MD   6 months ago Type 2 diabetes mellitus with other specified  complication, with long-term current use of insulin ([place])   [place], [place], MD   8 months ago Type 2 diabetes mellitus with other specified complication, with long-term current use of insulin ([place])   [place], Levada Dy M, [place]   9 months ago Localized edema   [facility] at [facility], Cari S, PA-C              Refused Prescriptions Disp Refills   liraglutide ([place]) 18 MG/3ML SOPN [Pharmacy Med Name: Victoza 2-Pak 0.6 mg/0.1 mL (18 mg/3 mL) subcutaneous pen injector] 6 mL 6    Sig: INJECT 1.2 MG [place]     Endocrinology:  Diabetes - GLP-1 Receptor Agonists Passed - 06/09/2022  2:06 PM      Passed - HBA1C is between 0 and 7.9 and within 180 days    HbA1c, POC (controlled diabetic range)  Date Value Ref Range Status  02/25/2022 7.4 (A) 0.0 - 7.0 % Final         Passed - Valid encounter within last 6 months    Recent Outpatient Visits           Yesterday Vaginal candidiasis   [place], [place], MD   3 months ago Type 2 diabetes mellitus with other specified complication, with long-term current use of insulin ([place])   [place], [place], MD   6 months ago Type 2 diabetes mellitus with other specified complication, with long-term current use of insulin ([place])   [place], [place], MD   8 months ago Type 2 diabetes mellitus with other specified complication, with long-term current use of insulin [facility])   [place], [place], [place]   9 months ago Localized edema   Primary Care at [place], [place]

## 2022-06-09 NOTE — Telephone Encounter (Signed)
If patient can be reached can she just come drop a urine sample off for testing?

## 2022-06-10 NOTE — Telephone Encounter (Signed)
Pt states that she was sent some medication on yesterday and she has no symptoms.

## 2022-06-11 ENCOUNTER — Telehealth: Payer: Self-pay | Admitting: Emergency Medicine

## 2022-06-11 NOTE — Telephone Encounter (Signed)
After viewing epic results show that sample could not be processed. Pt was informed and she states that she has done the test 3 times and does not want to do it anymore. I offered her to get a colonoscopy and she declined.

## 2022-06-11 NOTE — Telephone Encounter (Signed)
Copied from [place] [id number]. Topic: General - Other >> Jun 11, 2022 12:20 PM Sabas Sous wrote: Reason for CRM: Pt called requesting to discuss her cologuard results, please advise. Pt is requesting to speak to the clinic today

## 2022-06-14 ENCOUNTER — Encounter (HOSPITAL_COMMUNITY): Payer: Self-pay | Admitting: Emergency Medicine

## 2022-06-14 ENCOUNTER — Other Ambulatory Visit: Payer: Self-pay

## 2022-06-14 ENCOUNTER — Emergency Department (HOSPITAL_COMMUNITY)
Admission: EM | Admit: 2022-06-14 | Discharge: 2022-06-15 | Disposition: A | Discharge status: Home / Self Care | Payer: 59 | Attending: Emergency Medicine | Admitting: Emergency Medicine

## 2022-06-14 DIAGNOSIS — Z79899 Other long term (current) drug therapy: Secondary | ICD-10-CM | POA: Diagnosis not present

## 2022-06-14 DIAGNOSIS — Z794 Long term (current) use of insulin: Secondary | ICD-10-CM | POA: Diagnosis not present

## 2022-06-14 DIAGNOSIS — I10 Essential (primary) hypertension: Secondary | ICD-10-CM | POA: Insufficient documentation

## 2022-06-14 DIAGNOSIS — E871 Hypo-osmolality and hyponatremia: Secondary | ICD-10-CM | POA: Diagnosis not present

## 2022-06-14 DIAGNOSIS — E876 Hypokalemia: Secondary | ICD-10-CM | POA: Insufficient documentation

## 2022-06-14 DIAGNOSIS — E1165 Type 2 diabetes mellitus with hyperglycemia: Secondary | ICD-10-CM | POA: Insufficient documentation

## 2022-06-14 DIAGNOSIS — Z7982 Long term (current) use of aspirin: Secondary | ICD-10-CM | POA: Insufficient documentation

## 2022-06-14 DIAGNOSIS — R739 Hyperglycemia, unspecified: Secondary | ICD-10-CM | POA: Diagnosis present

## 2022-06-14 DIAGNOSIS — Z720 Tobacco use: Secondary | ICD-10-CM | POA: Insufficient documentation

## 2022-06-14 DIAGNOSIS — Z7984 Long term (current) use of oral hypoglycemic drugs: Secondary | ICD-10-CM | POA: Diagnosis not present

## 2022-06-14 LAB — BASIC METABOLIC PANEL
Anion gap: 9 (ref 5–15)
BUN: 20 mg/dL (ref 8–23)
CO2: 28 mmol/L (ref 22–32)
Calcium: 8.7 mg/dL — ABNORMAL LOW (ref 8.9–10.3)
Chloride: 93 mmol/L — ABNORMAL LOW (ref 98–111)
Creatinine, Ser: 0.89 mg/dL (ref 0.44–1.00)
GFR, Estimated: >60 mL/min (ref >60)
Glucose, Bld: 514 mg/dL (ref 70–99)
Potassium: 3.3 mmol/L — ABNORMAL LOW (ref 3.5–5.1)
Sodium: 130 mmol/L — ABNORMAL LOW (ref 135–145)

## 2022-06-14 LAB — CBC
HCT: 39.5 % (ref 36.0–46.0)
Hemoglobin: 13.7 g/dL (ref 12.0–15.0)
MCH: 30.3 pg (ref 26.0–34.0)
MCHC: 34.7 g/dL (ref 30.0–36.0)
MCV: 87.4 fL (ref 80.0–100.0)
Platelets: 211 10*3/uL (ref 150–400)
RBC: 4.52 MIL/uL (ref 3.87–5.11)
RDW: 12.6 % (ref 11.5–15.5)
WBC: 6.6 10*3/uL (ref 4.0–10.5)
nRBC: 0 % (ref 0.0–0.2)

## 2022-06-14 LAB — CBG MONITORING, ED: Glucose-Capillary: 463 mg/dL — ABNORMAL HIGH (ref 70–99)

## 2022-06-14 NOTE — ED Triage Notes (Signed)
Pt BIB [facility] for blood glucose 533 repeat 524. PT has excessive thirst and increase urinary frequency. Denies any other symptoms at this time.   20 LAC  300NS given by EMS.

## 2022-06-14 NOTE — ED Provider Triage Note (Signed)
Emergency Medicine Provider Triage Evaluation Note  Sarah Charles , a 62 y.o. female  was evaluated in triage.  Pt complains of hyperglycemia today.  Patient was evaluated by EMS earlier today and her CBG was in the 400s.  Patient notes her CBG with EMS today recently was at 533.  Patient takes insulin as well as metformin.  However notes that she has been missing a weeks worth of her insulin.  Patient was given 300 mL IV fluids via EMS.  Has associated polyuria and excessive thirst. Denies chest pain, shortness of breath, abdominal pain, nausea, vomiting.  Review of Systems  Positive:  Negative:   Physical Exam  BP 136/68 (BP Location: Left Arm)   Pulse 77   Temp 98.2 F (36.8 C)   Resp 20   SpO2 97%  Gen:   Awake, no distress   Resp:  Normal effort  MSK:   Moves extremities without difficulty  Other:    Medical Decision Making  Medically screening exam initiated at 7:43 PM.  Appropriate orders placed.  Jessamyn Watterson was informed that the remainder of the evaluation will be completed by another provider, this initial triage assessment does not replace that evaluation, and the importance of remaining in the ED until their evaluation is complete.  Workup initiated   Jakorian Marengo A, PA-C 06/14/22 1943

## 2022-06-15 DIAGNOSIS — E1165 Type 2 diabetes mellitus with hyperglycemia: Secondary | ICD-10-CM | POA: Diagnosis not present

## 2022-06-15 LAB — URINALYSIS, ROUTINE W REFLEX MICROSCOPIC
Bilirubin Urine: NEGATIVE
Glucose, UA: >=500 mg/dL — AB
Hgb urine dipstick: NEGATIVE
Ketones, ur: NEGATIVE mg/dL
Leukocytes,Ua: NEGATIVE
Nitrite: NEGATIVE
Protein, ur: NEGATIVE mg/dL
Specific Gravity, Urine: 1.033 — ABNORMAL HIGH (ref 1.005–1.030)
pH: 5 (ref 5.0–8.0)

## 2022-06-15 LAB — I-STAT VENOUS BLOOD GAS, ED
Acid-Base Excess: 2 mmol/L (ref 0.0–2.0)
Bicarbonate: 27.6 mmol/L (ref 20.0–28.0)
Calcium, Ion: 1.1 mmol/L — ABNORMAL LOW (ref 1.15–1.40)
HCT: 38 % (ref 36.0–46.0)
Hemoglobin: 12.9 g/dL (ref 12.0–15.0)
O2 Saturation: 98 %
Potassium: 3.6 mmol/L (ref 3.5–5.1)
Sodium: 135 mmol/L (ref 135–145)
TCO2: 29 mmol/L (ref 22–32)
pCO2, Ven: 43.9 mmHg — ABNORMAL LOW (ref 44–60)
pH, Ven: 7.406 (ref 7.25–7.43)
pO2, Ven: 110 mmHg — ABNORMAL HIGH (ref 32–45)

## 2022-06-15 LAB — COLOGUARD
COLOGUARD: NEGATIVE
Cologuard: NEGATIVE

## 2022-06-15 LAB — OSMOLALITY: Osmolality: 311 mOsm/kg — ABNORMAL HIGH (ref 275–295)

## 2022-06-15 LAB — CBG MONITORING, ED
Glucose-Capillary: 535 mg/dL (ref 70–99)
Glucose-Capillary: 330 mg/dL — ABNORMAL HIGH (ref 70–99)
Glucose-Capillary: 223 mg/dL — ABNORMAL HIGH (ref 70–99)

## 2022-06-15 LAB — BETA-HYDROXYBUTYRIC ACID: Beta-Hydroxybutyric Acid: 0.16 mmol/L (ref 0.05–0.27)

## 2022-06-15 MED ORDER — INSULIN ASPART 100 UNIT/ML IJ SOLN
8.0000 [IU] | Freq: Once | INTRAMUSCULAR | Status: AC
  Administered 2022-06-15: 8 [IU] via SUBCUTANEOUS

## 2022-06-15 MED ORDER — SODIUM CHLORIDE 0.9 % IV BOLUS
1000.0000 mL | Freq: Once | INTRAVENOUS | Status: AC
  Administered 2022-06-15: 1000 mL via INTRAVENOUS

## 2022-06-15 NOTE — Discharge Instructions (Signed)
Please resume your insulin usage as prescribed and follow-up with your PCP return to the ER with any severe symptoms

## 2022-06-15 NOTE — ED Provider Notes (Signed)
[place] EMERGENCY DEPARTMENT AT [facility] Provider Note   CSN: [id number] Arrival date & time: 06/14/22  1935     History  Chief Complaint  Patient presents with   Hyperglycemia    Sarah Charles is a 62 y.o. female with history of type 2 diabetes, insulin-dependent who presents with concern for elevated blood sugars.  Patient states that she has been staying with her mother who was recently discharged from the hospital and has been without her insulin for the last 5 days.  Endorses urinary frequency and increased thirst but otherwise states that she has been feeling normal.  No nausea vomiting diarrhea or confusion.  Presented solely for sugars in the 500s at home.  Addition to the above listed history patient history of hypertension, GERD, dyslipidemia.  Tobacco use.  HPI     Home Medications Prior to Admission medications   Medication Sig Start Date End Date Taking? Authorizing Provider  aspirin EC 81 MG tablet Take 81 mg by mouth daily.    [provider]  benzonatate (TESSALON) 100 MG capsule Take 1 capsule (100 mg total) by mouth 3 (three) times daily as needed for cough. 03/19/22   Zenia Resides, MD  Blood Glucose Monitoring Suppl [facility] VERIO) w/Device KIT Use to test blood sugar 3 times a day 02/09/22   [name], MD  buPROPion (WELLBUTRIN SR) 150 MG 12 hr tablet TAKE ONE TABLET BY MOUTH TWICE DAILY 05/14/22   [name], MD  cetirizine (ZYRTEC) 10 MG tablet TAKE 1 TABLET BY MOUTH EVERY DAY 08/02/19   [name], MD  clotrimazole (LOTRIMIN) 1 % cream APPLY TO THE AFFECTED AREA(S) beneath BOTH breasts TWICE DAILY 03/08/22   [name], MD  diclofenac Sodium (VOLTAREN) 1 % GEL Apply 4 g topically 4 (four) times daily. 05/05/21   [name], MD  dicyclomine (BENTYL) 10 MG capsule Take 1 capsule (10 mg total) by mouth daily. 02/12/22   [name], MD  fenofibrate (TRICOR) 145 MG tablet TAKE ONE TABLET BY MOUTH EVERY  EVENING 05/14/22   [name], MD  FLUoxetine (PROZAC) 20 MG capsule TAKE ONE TABLET BY MOUTH EVERY MORNING 05/14/22   [name], MD  furosemide (LASIX) 20 MG tablet Take 1 tablet (20 mg total) by mouth daily. 11/12/21   [name], MD  gabapentin (NEURONTIN) 300 MG capsule TAKE TWO CAPSULES BY MOUTH TWICE DAILY 01/05/22   [name], MD  glucose blood (ONETOUCH VERIO) test strip Use as instructed 02/09/22   [name], MD  Insulin Pen Needle (COMFORT EZ PEN NEEDLES) 31G X 5 MM MISC USE AS DIRECTED ONCE DAILY 09/16/21   [name], MD  Lancets [facility] DELICA PLUS [place]) MISC Use to test blood sugar 3 times a day 02/09/22   [name], MD  Lancets Misc. (ACCU-CHEK FASTCLIX LANCET) KIT USE AS DIRECTED 11/12/21   [name], MD  LANTUS SOLOSTAR 100 UNIT/ML Solostar Pen Inject 50 units into THE SKIN AT bedtime 06/09/22   [name], MD  lidocaine (LIDODERM) 5 % Place ONE PATCH onto THE SKIN daily. REMOVE & Discard PATCH WITHIN 12 hours OR as directed by MD 06/09/22   [name], MD  lisinopril (ZESTRIL) 40 MG tablet Take 1 tablet (40 mg total) by mouth daily. 02/25/22   [name], MD  metFORMIN (GLUCOPHAGE) 500 MG tablet Take 2 tablets (1,000 mg total) by mouth 2 (two) times daily with a meal. 02/25/22   [name], MD  metoCLOPramide (REGLAN) 10 MG tablet  TAKE 1 TABLET BY MOUTH EVERY EIGHT HOURS AS NEEDED FOR NAUSEA OR VOMITING. 08/14/20   [name], PA-C  metoprolol succinate (TOPROL-XL) 25 MG 24 hr tablet Take 1 tablet (25 mg total) by mouth daily. Take with or immediately following a meal. 02/25/22   Charlott Rakes, MD  Multiple Vitamin (MULTIVITAMIN) tablet Take 1 tablet by mouth daily.    [provider]  mupirocin ointment (BACTROBAN) 2 % Apply 1 Application topically 2 (two) times daily. To affected area till better 03/19/22   Barrett Henle, MD  naproxen (NAPROSYN) 375 MG tablet Take 1 tablet (375 mg total) by mouth 2  (two) times daily with a meal. 07/20/21   Crain, Whitney L, PA  nitroGLYCERIN (NITROSTAT) 0.4 MG SL tablet Place 0.4 mg under the tongue every 5 (five) minutes as needed for chest pain.    [provider]  pantoprazole (PROTONIX) 40 MG tablet TAKE ONE TABLET BY MOUTH ONCE DAILY 01/13/22   Charlott Rakes, MD  PRALUENT 75 MG/ML SOAJ INJECT 75 MG into THE SKIN every 14 DAYS 06/16/21   Loel Dubonnet, NP  rosuvastatin (CRESTOR) 40 MG tablet TAKE ONE TABLET BY MOUTH EVERY EVENING 01/13/22   Charlott Rakes, MD  Semaglutide, 1 MG/DOSE, 4 MG/3ML SOPN Inject 1 mg as directed once a week. For 4 weeks then increase to 2mg  weekly thereafter 02/25/22   Charlott Rakes, MD  Semaglutide, 2 MG/DOSE, 8 MG/3ML SOPN Inject 2 mg as directed once a week. 02/25/22   Charlott Rakes, MD  terbinafine (LAMISIL) 250 MG tablet Please take one a day x 7days, repeat every 4 weeks x 4 months 05/07/21   Wallene Huh, DPM  traZODone (DESYREL) 50 MG tablet TAKE ONE TABLET BY MOUTH EVERYDAY AT BEDTIME 05/14/22   Charlott Rakes, MD  triamcinolone cream (KENALOG) 0.1 % APPLY TO THE AFFECTED AREA(S) ON BOTH LEGS TWICE DAILY 05/10/22   Charlott Rakes, MD  vitamin B-12 (CYANOCOBALAMIN) 500 MCG tablet TAKE ONE TABLET BY MOUTH ONCE DAILY 04/01/22   Charlott Rakes, MD  vitamin C (ASCORBIC ACID) 500 MG tablet Take 500 mg by mouth daily.    [provider]      Allergies    Cymbalta [duloxetine hcl]    Review of Systems   Review of Systems  Gastrointestinal: Negative.   Endocrine: Positive for polydipsia and polyuria.    Physical Exam Updated Vital Signs BP (!) 143/74   Pulse 69   Temp 98.2 F (36.8 C)   Resp (!) 22   Ht 5\' 3"  (1.6 m)   Wt 67 kg   SpO2 92%   BMI 26.17 kg/m  Physical Exam Vitals and nursing note reviewed.  Constitutional:      Appearance: She is obese. She is not ill-appearing or toxic-appearing.  HENT:     Head: Normocephalic and atraumatic.     Mouth/Throat:     Mouth: Mucous  membranes are dry.     Pharynx: No oropharyngeal exudate or posterior oropharyngeal erythema.  Eyes:     General:        Right eye: No discharge.        Left eye: No discharge.     Extraocular Movements: Extraocular movements intact.     Conjunctiva/sclera: Conjunctivae normal.     Pupils: Pupils are equal, round, and reactive to light.  Cardiovascular:     Rate and Rhythm: Normal rate and regular rhythm.     Pulses: Normal pulses.     Heart sounds:  Normal heart sounds. No murmur heard. Pulmonary:     Effort: Pulmonary effort is normal. No respiratory distress.     Breath sounds: Normal breath sounds. No wheezing or rales.  Abdominal:     General: Bowel sounds are normal. There is no distension.     Palpations: Abdomen is soft.     Tenderness: There is no abdominal tenderness. There is no guarding or rebound.  Musculoskeletal:        General: No deformity.     Cervical back: Neck supple.  Skin:    General: Skin is warm and dry.  Neurological:     General: No focal deficit present.     Mental Status: She is alert and oriented to person, place, and time. Mental status is at baseline.     GCS: GCS eye subscore is 4. GCS verbal subscore is 5. GCS motor subscore is 6.  Psychiatric:        Mood and Affect: Mood normal.     ED Results / Procedures / Treatments   Labs (all labs ordered are listed, but only abnormal results are displayed) Labs Reviewed  BASIC METABOLIC PANEL - Abnormal; Notable for the following components:      Result Value   Sodium 130 (*)    Potassium 3.3 (*)    Chloride 93 (*)    Glucose, Bld 514 (*)    Calcium 8.7 (*)    All other components within normal limits  URINALYSIS, ROUTINE W REFLEX MICROSCOPIC - Abnormal; Notable for the following components:   Color, Urine STRAW (*)    Specific Gravity, Urine 1.033 (*)    Glucose, UA >=500 (*)    Bacteria, UA MANY (*)    All other components within normal limits  CBG MONITORING, ED - Abnormal; Notable for  the following components:   Glucose-Capillary 463 (*)    All other components within normal limits  CBG MONITORING, ED - Abnormal; Notable for the following components:   Glucose-Capillary 535 (*)    All other components within normal limits  I-STAT VENOUS BLOOD GAS, ED - Abnormal; Notable for the following components:   pCO2, Ven 43.9 (*)    pO2, Ven 110 (*)    Calcium, Ion 1.10 (*)    All other components within normal limits  CBG MONITORING, ED - Abnormal; Notable for the following components:   Glucose-Capillary 330 (*)    All other components within normal limits  CBG MONITORING, ED - Abnormal; Notable for the following components:   Glucose-Capillary 223 (*)    All other components within normal limits  CBC  BETA-HYDROXYBUTYRIC ACID  OSMOLALITY    EKG None  Radiology No results found.  Procedures Procedures    Medications Ordered in ED Medications  sodium chloride 0.9 % bolus 1,000 mL (0 mLs Intravenous Stopped 06/15/22 0349)  insulin aspart (novoLOG) injection 8 Units (8 Units Subcutaneous Given 06/15/22 0152)  sodium chloride 0.9 % bolus 1,000 mL (1,000 mLs Intravenous New Bag/Given 06/15/22 0347)    ED Course/ Medical Decision Making/ A&P                             Medical Decision Making 62 year old female presents with hyperglycemia in context of insulin noncompliance.  Vital signs normal on intake.  Cardiopulmonary abdominal exams benign.  GCS of 15.  Patient with dry mucous membranes, otherwise reassuring physical exam with brisk capillary refill normal heart rate.  DDx includes limited  to hyperglycemia, DKA, HHS.  Amount and/or Complexity of Data Reviewed Labs: ordered.    Details: CBC the without leukocytosis or anemia, BMP with hyponatremia of 130, however when corrected for hyperglycemia 514 sodium is approximately 137.  Potassium at 3.3.  No anion gap.  I-STAT VBG with normal pH of 7.4.  Beta hydroxybutyric acid of 0.16.   Risk Prescription drug  management.    Patient without evidence of hyperglycemic crisis at this time.  Has been given fluid bolus and dose of insulin.  Repeat CBG improved after bolus x 2 and insulin dose.  No evidence of hyperglycemic crisis such as DKA or HHS.  Patient returning home where she has her insulin available at this time.  I do not feel any further workup is warranted in the ER.  Clinical concern for emergent underlying etiology that warrant further ED workup or inpatient management is exceedingly low.  Elza voiced understanding of her medical evaluation and treatment plan. Each of their questions answered to their expressed satisfaction.  Return precautions were given.  Patient is well-appearing, stable, and was discharged in good condition.  This chart was dictated using voice recognition software, Dragon. Despite the best efforts of this provider to proofread and correct errors, errors may still occur which can change documentation meaning. . Final Clinical Impression(s) / ED Diagnoses Final diagnoses:  Hyperglycemia    Rx / DC Orders ED Discharge Orders     None         Sherrilee Gilles 06/15/22 0541    Dione Booze, MD 06/15/22 [phone]

## 2022-06-23 ENCOUNTER — Ambulatory Visit: Payer: Self-pay | Admitting: *Deleted

## 2022-06-23 NOTE — Telephone Encounter (Signed)
Scheduled for virtual visit for 06/24/22 with Dr. Margarita Rana.  Patient only would see this provider.

## 2022-06-23 NOTE — Telephone Encounter (Signed)
Summary: cough / rx req   The patient has experienced a cough for roughly two days  The patient has also experienced congestion  The patient would like to be prescribed something for their cough  Please contact further when possible       Chief Complaint: cough, runny nose Symptoms: dry cough non productive , runny nose clear drainage. Constant cough. Reports chest congested not coughing up anything. Taking mucinex. Reports her mother in hospital with "flu".  Frequency: 2 days  Pertinent Negatives: Patient denies chest pain no difficulty breathing no fever. Has not tested for flu  Disposition: [] ED /[x] Urgent Care (no appt availability in office) / [] Appointment(In office/virtual)/ []  [facility]/ [] Home Care/ [] Refused Recommended Disposition /[] [facility]/ []  Follow-up with PCP Additional Notes:   No available appt until 06/30/22. Recommended UC if needs to be seen earlier. Please advise if appt can be made or medication given. Patient would like a call back.      Reason for Disposition  [1] Continuous (nonstop) coughing interferes with work or school AND [2] no improvement using cough treatment per Care Advice  Answer Assessment - Initial Assessment Questions 1. ONSET: "When did the cough begin?"      2 days  2. SEVERITY: "How bad is the cough today?"      Constant cough  3. SPUTUM: "Describe the color of your sputum" (none, dry cough; clear, white, yellow, green)     None  4. HEMOPTYSIS: "Are you coughing up any blood?" If so ask: "How much?" (flecks, streaks, tablespoons, etc.)     na 5. DIFFICULTY BREATHING: "Are you having difficulty breathing?" If Yes, ask: "How bad is it?" (e.g., mild, moderate, severe)    - MILD: No SOB at rest, mild SOB with walking, speaks normally in sentences, can lie down, no retractions, pulse < 100.    - MODERATE: SOB at rest, SOB with minimal exertion and prefers to sit, cannot lie down flat, speaks in phrases, mild  retractions, audible wheezing, pulse 100-120.    - SEVERE: Very SOB at rest, speaks in single words, struggling to breathe, sitting hunched forward, retractions, pulse > 120      na 6. FEVER: "Do you have a fever?" If Yes, ask: "What is your temperature, how was it measured, and when did it start?"     na 7. CARDIAC HISTORY: "Do you have any history of heart disease?" (e.g., heart attack, congestive heart failure)      Hx heart issues  8. LUNG HISTORY: "Do you have any history of lung disease?"  (e.g., pulmonary embolus, asthma, emphysema)     na 9. PE RISK FACTORS: "Do you have a history of blood clots?" (or: recent major surgery, recent prolonged travel, bedridden)     na 10. OTHER SYMPTOMS: "Do you have any other symptoms?" (e.g., runny nose, wheezing, chest pain)       Runny nose clear drainage  dry cough  11. PREGNANCY: "Is there any chance you are pregnant?" "When was your last menstrual period?"       na 12. TRAVEL: "Have you traveled out of the country in the last month?" (e.g., travel history, exposures)       na  Protocols used: Cough - Acute Non-Productive-A-AH

## 2022-06-24 ENCOUNTER — Ambulatory Visit: Payer: 59 | Attending: Family Medicine | Admitting: Family Medicine

## 2022-06-24 ENCOUNTER — Encounter: Payer: Self-pay | Admitting: Family Medicine

## 2022-06-24 DIAGNOSIS — R051 Acute cough: Secondary | ICD-10-CM

## 2022-06-24 MED ORDER — PREDNISONE 20 MG PO TABS: 20.0000 mg | ORAL_TABLET | Freq: Every day | ORAL | 0 refills | Status: DC

## 2022-06-24 NOTE — Patient Instructions (Signed)

## 2022-06-24 NOTE — Progress Notes (Signed)
Virtual Visit via Telephone Note  I connected with Sarah Charles, on 06/24/2022 at 8:28 AM by telephone and verified that I am speaking with the correct person using two identifiers.   Consent: I discussed the limitations, risks, security and privacy concerns of performing an evaluation and management service by telephone and the availability of in person appointments. I also discussed with the patient that there may be a patient responsible charge related to this service. The patient expressed understanding and agreed to proceed.   Location of Patient: Home  Location of Provider: Clinic   Persons participating in Telemedicine visit: [name] Dr. Margarita Rana     History of Present Illness: Sarah Charles is a 62 y.o. year old female with type 2 diabetes mellitus (A1c 7.4), hypertension, hyperlipidemia, GERD, CAD (s/p DES)., Nicotine dependence. Attempted video visit but she was unable to connect to the video visit despite my providing instructions on trying to guide her through.  She has had a cough which is dry but denies presence of chest pain, dyspnea, no sore throat, no sinus pressure, no myalgias or fever.  She does have slight wheezing.  Symptoms have been present for 3 days and she has used Robitussin without relief. She continues to smoke about 8 Cigarettes/day  Past Medical History:  Diagnosis Date   Arthritis    Coronary artery calcification seen on CT scan    a. 07/2017 noted on CT abd.   Depression    GERD (gastroesophageal reflux disease)    Hyperlipidemia    Hypertension    Obesity    Sleep apnea    a. Does not use CPAP "cause i dont think I need it anymore".   Tobacco abuse    Type II diabetes mellitus (HCC)    Allergies  Allergen Reactions   Cymbalta [Duloxetine Hcl] Nausea Only    Nausea, lack of therapeutic effect    Current Outpatient Medications on File Prior to Visit  Medication Sig Dispense Refill   aspirin EC 81 MG tablet Take 81 mg by mouth daily.      benzonatate (TESSALON) 100 MG capsule Take 1 capsule (100 mg total) by mouth 3 (three) times daily as needed for cough. 21 capsule 0   Blood Glucose Monitoring Suppl (ONETOUCH VERIO) w/Device KIT Use to test blood sugar 3 times a day 1 kit 0   buPROPion (WELLBUTRIN SR) 150 MG 12 hr tablet TAKE ONE TABLET BY MOUTH TWICE DAILY 180 tablet 0   cetirizine (ZYRTEC) 10 MG tablet TAKE 1 TABLET BY MOUTH EVERY DAY 30 tablet 2   clotrimazole (LOTRIMIN) 1 % cream APPLY TO THE AFFECTED AREA(S) beneath BOTH breasts TWICE DAILY 60 g 1   diclofenac Sodium (VOLTAREN) 1 % GEL Apply 4 g topically 4 (four) times daily. 100 g 1   dicyclomine (BENTYL) 10 MG capsule Take 1 capsule (10 mg total) by mouth daily. 30 capsule 1   fenofibrate (TRICOR) 145 MG tablet TAKE ONE TABLET BY MOUTH EVERY EVENING 90 tablet 0   FLUoxetine (PROZAC) 20 MG capsule TAKE ONE TABLET BY MOUTH EVERY MORNING 90 capsule 0   furosemide (LASIX) 20 MG tablet Take 1 tablet (20 mg total) by mouth daily. 90 tablet 1   gabapentin (NEURONTIN) 300 MG capsule TAKE TWO CAPSULES BY MOUTH TWICE DAILY 360 capsule 1   glucose blood (ONETOUCH VERIO) test strip Use as instructed 100 each 12   Insulin Pen Needle (COMFORT EZ PEN NEEDLES) 31G X 5 MM MISC USE AS DIRECTED ONCE  DAILY 100 each 0   Lancets (ONETOUCH DELICA PLUS [id number]) MISC Use to test blood sugar 3 times a day 100 each 12   Lancets Misc. (ACCU-CHEK FASTCLIX LANCET) KIT USE AS DIRECTED 1 kit 11   LANTUS SOLOSTAR 100 UNIT/ML Solostar Pen Inject 50 units into THE SKIN AT bedtime 45 mL 6   lidocaine (LIDODERM) 5 % Place ONE PATCH onto THE SKIN daily. REMOVE & Discard PATCH WITHIN 12 hours OR as directed by MD 30 patch 6   lisinopril (ZESTRIL) 40 MG tablet Take 1 tablet (40 mg total) by mouth daily. 90 tablet 1   metFORMIN (GLUCOPHAGE) 500 MG tablet Take 2 tablets (1,000 mg total) by mouth 2 (two) times daily with a meal. 360 tablet 1   metoCLOPramide (REGLAN) 10 MG tablet TAKE 1 TABLET BY MOUTH EVERY  EIGHT HOURS AS NEEDED FOR NAUSEA OR VOMITING. 40 tablet 0   metoprolol succinate (TOPROL-XL) 25 MG 24 hr tablet Take 1 tablet (25 mg total) by mouth daily. Take with or immediately following a meal. 90 tablet 1   Multiple Vitamin (MULTIVITAMIN) tablet Take 1 tablet by mouth daily.     mupirocin ointment (BACTROBAN) 2 % Apply 1 Application topically 2 (two) times daily. To affected area till better 22 g 0   naproxen (NAPROSYN) 375 MG tablet Take 1 tablet (375 mg total) by mouth 2 (two) times daily with a meal. 20 tablet 0   nitroGLYCERIN (NITROSTAT) 0.4 MG SL tablet Place 0.4 mg under the tongue every 5 (five) minutes as needed for chest pain.     pantoprazole (PROTONIX) 40 MG tablet TAKE ONE TABLET BY MOUTH ONCE DAILY 90 tablet 1   PRALUENT 75 MG/ML SOAJ INJECT 75 MG into THE SKIN every 14 DAYS 2 mL 11   rosuvastatin (CRESTOR) 40 MG tablet TAKE ONE TABLET BY MOUTH EVERY EVENING 90 tablet 1   Semaglutide, 1 MG/DOSE, 4 MG/3ML SOPN Inject 1 mg as directed once a week. For 4 weeks then increase to 2mg  weekly thereafter 3 mL 0   Semaglutide, 2 MG/DOSE, 8 MG/3ML SOPN Inject 2 mg as directed once a week. 3 mL 6   terbinafine (LAMISIL) 250 MG tablet Please take one a day x 7days, repeat every 4 weeks x 4 months 28 tablet 0   traZODone (DESYREL) 50 MG tablet TAKE ONE TABLET BY MOUTH EVERYDAY AT BEDTIME 90 tablet 0   triamcinolone cream (KENALOG) 0.1 % APPLY TO THE AFFECTED AREA(S) ON BOTH LEGS TWICE DAILY 45 g 0   vitamin B-12 (CYANOCOBALAMIN) 500 MCG tablet TAKE ONE TABLET BY MOUTH ONCE DAILY 30 tablet 2   vitamin C (ASCORBIC ACID) 500 MG tablet Take 500 mg by mouth daily.     No current facility-administered medications on file prior to visit.    ROS: See HPI  Observations/Objective: Awake, alert, oriented x3 Not in acute distress Normal mood      Latest Ref Rng & Units 06/15/2022    3:03 AM 06/14/2022    7:43 PM 04/24/2022    1:39 PM  CMP  Glucose 70 - 99 mg/dL  514  328   BUN 8 - 23 mg/dL   20  19   Creatinine 0.44 - 1.00 mg/dL  0.89  0.67   Sodium 135 - 145 mmol/L 135  130  138   Potassium 3.5 - 5.1 mmol/L 3.6  3.3  4.1   Chloride 98 - 111 mmol/L  93  104   CO2 22 - 32 mmol/L  28  26   Calcium 8.9 - 10.3 mg/dL  8.7  9.7   Total Protein 6.5 - 8.1 g/dL   6.5   Total Bilirubin 0.3 - 1.2 mg/dL   <0.1   Alkaline Phos 38 - 126 U/L   72   AST 15 - 41 U/L   16   ALT 0 - 44 U/L   14     Lipid Panel     Component Value Date/Time   CHOL 137 11/12/2021 1018   TRIG 102 11/12/2021 1018   HDL 54 11/12/2021 1018   CHOLHDL 3.9 08/14/2020 1124   CHOLHDL 9.7 (H) 07/23/2016 1638   VLDL NOT CALC 07/23/2016 1638   LDLCALC 64 11/12/2021 1018   LABVLDL 19 11/12/2021 1018    Lab Results  Component Value Date   HGBA1C 7.4 (A) 02/25/2022    Assessment and Plan: 1. Acute cough Symptoms are uncontrolled on Robitussin Advised that if symptoms persist she will need an office visit We will also need to consider workup for COPD given smoking history if symptoms persist She is currently on Wellbutrin for smoking cessation but is not ready to quit smoking - predniSONE (DELTASONE) 20 MG tablet; Take 1 tablet (20 mg total) by mouth daily with breakfast.  Dispense: 5 tablet; Refill: 0   Follow Up Instructions: Keep previously scheduled appointment   I discussed the assessment and treatment plan with the patient. The patient was provided an opportunity to ask questions and all were answered. The patient agreed with the plan and demonstrated an understanding of the instructions.   The patient was advised to call back or seek an in-person evaluation if the symptoms worsen or if the condition fails to improve as anticipated.     I provided 12 minutes total of non-face-to-face time during this encounter.   Charlott Rakes, MD, FAAFP. [facility] [place] [facility] [place], [place]   06/24/2022, 8:28 AM

## 2022-07-09 ENCOUNTER — Ambulatory Visit: Payer: Self-pay

## 2022-07-09 ENCOUNTER — Encounter (HOSPITAL_COMMUNITY): Payer: Self-pay

## 2022-07-09 ENCOUNTER — Ambulatory Visit (HOSPITAL_COMMUNITY)
Admission: EM | Admit: 2022-07-09 | Discharge: 2022-07-09 | Disposition: A | Discharge status: Home / Self Care | Payer: 59 | Attending: Emergency Medicine | Admitting: Emergency Medicine

## 2022-07-09 DIAGNOSIS — E1165 Type 2 diabetes mellitus with hyperglycemia: Secondary | ICD-10-CM | POA: Diagnosis present

## 2022-07-09 DIAGNOSIS — N390 Urinary tract infection, site not specified: Secondary | ICD-10-CM | POA: Diagnosis present

## 2022-07-09 DIAGNOSIS — Z794 Long term (current) use of insulin: Secondary | ICD-10-CM | POA: Insufficient documentation

## 2022-07-09 LAB — POCT URINALYSIS DIPSTICK, ED / UC
Bilirubin Urine: NEGATIVE
Glucose, UA: >=1000 mg/dL — AB
Hgb urine dipstick: NEGATIVE
Ketones, ur: NEGATIVE mg/dL
Leukocytes,Ua: NEGATIVE
Nitrite: POSITIVE — AB
Protein, ur: NEGATIVE mg/dL
Specific Gravity, Urine: 1.01 (ref 1.005–1.030)
Urobilinogen, UA: 0.2 mg/dL (ref 0.0–1.0)
pH: 5 (ref 5.0–8.0)

## 2022-07-09 LAB — CBG MONITORING, ED: Glucose-Capillary: 420 mg/dL — ABNORMAL HIGH (ref 70–99)

## 2022-07-09 MED ORDER — FLUCONAZOLE 200 MG PO TABS: 200.0000 mg | ORAL_TABLET | Freq: Once | ORAL | 0 refills | Status: DC | PRN

## 2022-07-09 MED ORDER — CIPROFLOXACIN HCL 500 MG PO TABS: 500.0000 mg | ORAL_TABLET | Freq: Two times a day (BID) | ORAL | 0 refills | Status: AC

## 2022-07-09 NOTE — ED Triage Notes (Signed)
Patient c/o urinary frequency x 3 weeks. Patient denies any dysuria or fever.  Patient denies taking  any medications for her symptoms  today.

## 2022-07-09 NOTE — Telephone Encounter (Signed)
  Chief Complaint: Frequent urination Symptoms: Frequent urination - uncontrolled urination Frequency: 6 months Pertinent Negatives: Patient denies Pain Disposition: []$ ED /[x]$ Urgent Care (no appt availability in office) / []$ Appointment(In office/virtual)/ []$  [facility]/ []$ Home Care/ []$ Refused Recommended Disposition /[]$ [place] [facility]/ []$  Follow-up with PCP Additional Notes: PT states that she has had uncontrollable frequent urination for the past 6 months.  Pt has actaully wet herself many times.  Summary: frequent urination   Pt stated having frequent urination and wetting herself. Stated has been going on for a while. Pt denied abdominal pain and any other symptoms.  Pt seeking clinical advice.     Reason for Disposition  Urinating more frequently than usual (i.e., frequency)  Answer Assessment - Initial Assessment Questions 1. SYMPTOM: "What's the main symptom you're concerned about?" (e.g., frequency, incontinence)     Frequency and wetting herself 2. ONSET: "When did the  Frequency  start?"     6 months 3. PAIN: "Is there any pain?" If Yes, ask: "How bad is it?" (Scale: 1-10; mild, moderate, severe)     no 4. CAUSE: "What do you think is causing the symptoms?"     Unsure 5. OTHER SYMPTOMS: "Do you have any other symptoms?" (e.g., blood in urine, fever, flank pain, pain with urination)     no 6. PREGNANCY: "Is there any chance you are pregnant?" "When was your last menstrual period?"  Protocols used: Urinary Symptoms-A-AH

## 2022-07-09 NOTE — Telephone Encounter (Signed)
Patient seen in ed. Positive for UTI

## 2022-07-09 NOTE — Discharge Instructions (Addendum)
Your urinalysis was consistent with a urinary tract infection.  Please take ciprofloxacin 500 mg orally twice daily for the next 7 days.  It is important that you finish all of your antibiotics.  If you develop vaginal itching, or symptoms of yeast infection you can take a fluconazole as needed.  Please seek immediate care if you develop nausea, vomiting, flank pain, fever or worsening of symptoms.  You are diabetic and it is imperative that you monitor your glucose and take your insulin as prescribed.  Your blood sugar in the clinic today was over 400.  Please take your insulin once you get home.  Please refrain from drinking sodas with sugar, you could opt for diet or water.  To help prevent urinary tract infections in the future please drink less sodas, you can also take pure Cranberry extract which is available over-the-counter, as juices would impact your blood sugar.  Please follow-up with your primary care provider regarding your glucose control.

## 2022-07-09 NOTE — ED Provider Notes (Addendum)
[place]    CSN: [id number] Arrival date & time: 07/09/22  1121      History   Chief Complaint Chief Complaint  Patient presents with   Urinary Frequency    HPI Sarah Charles is a 62 y.o. female.   Reports urinary frequency every 10-15 min for about 3 months, has not sought any treatment for this. Denies dysuria, hematuria or odor. Blood glucose last night was in 117, takes insulin and semaglutide, did not take her insulin today. Unsure of last A1C. CBG in clinic was 420 today, is drinking an Orange Crush. Reports mild bilateral flank pain, denies N/V. Denies chest pain, SOB, or recent illness.   The history is provided by the patient.  Urinary Frequency Pertinent negatives include no chest pain, no abdominal pain and no shortness of breath.    Past Medical History:  Diagnosis Date   Arthritis    Coronary artery calcification seen on CT scan    a. 07/2017 noted on CT abd.   Depression    GERD (gastroesophageal reflux disease)    Hyperlipidemia    Hypertension    Obesity    Sleep apnea    a. Does not use CPAP "cause i dont think I need it anymore".   Tobacco abuse    Type II diabetes mellitus ([place])     Patient Active Problem List   Diagnosis Date Noted   Sleep apnea, unspecified 11/26/2020   CAD (coronary artery disease) 11/22/2017   Chest pain, precordial 11/04/2017   Acute chest pain    Type 2 diabetes mellitus with hyperlipidemia ([place]) 10/29/2017   Unstable angina ([place]) 10/29/2017   Diabetic polyneuropathy associated with type 2 diabetes mellitus ([place]) 05/30/2017   Mixed dyslipidemia 05/30/2017   Depression 05/31/2016   Hypertension 05/31/2016   Uncontrolled type 2 diabetes mellitus with complication [id number]   Uncontrolled type 2 diabetes mellitus with complication, with long-term current use of insulin 04/20/2015   Cellulitis of breast 12/29/2013   Diabetes mellitus ([place]) 12/29/2013   Cellulitis of female breast 12/29/2013   Symptomatic  menopausal or female climacteric states 04/08/2008   OTHER SPECIFIED DISEASE OF NAIL 04/08/2008   URINARY URGENCY 04/08/2008   COUGH 04/02/2008   HYPERTENSION, BENIGN ESSENTIAL 04/12/2007   FATIGUE 04/12/2007   OBESITY, NOS 07/21/2006   Major depressive disorder, recurrent episode ([place]) 07/21/2006   TOBACCO DEPENDENCE 07/21/2006   GASTROESOPHAGEAL REFLUX, NO ESOPHAGITIS 07/21/2006   INCONTINENCE, STRESS, FEMALE 07/21/2006    Past Surgical History:  Procedure Laterality Date   CHOLECYSTECTOMY     CORONARY STENT INTERVENTION N/A 11/04/2017   Procedure: CORONARY STENT INTERVENTION;  Surgeon: Martinique, Peter M, MD;  Location: [facility];  Service: Cardiovascular;  Laterality: N/A;   INCISION AND DRAINAGE ABSCESS Left 12/22/2012   Procedure: INCISION AND DRAINAGE ABSCESS;  Surgeon: Jamesetta So, MD;  Location: [facility];  Service: General;  Laterality: Left;   INTRAVASCULAR PRESSURE WIRE/FFR STUDY N/A 11/04/2017   Procedure: INTRAVASCULAR PRESSURE WIRE/FFR STUDY;  Surgeon: Martinique, Peter M, MD;  Location: [facility];  Service: Cardiovascular;  Laterality: N/A;   LEFT HEART CATH AND CORONARY ANGIOGRAPHY N/A 11/04/2017   Procedure: LEFT HEART CATH AND CORONARY ANGIOGRAPHY;  Surgeon: Martinique, Peter M, MD;  Location: [facility];  Service: Cardiovascular;  Laterality: N/A;   TONSILLECTOMY     TUBAL LIGATION      OB History   No obstetric history on file.      Home Medications  Prior to Admission medications   Medication Sig Start Date End Date Taking? Authorizing Provider  ciprofloxacin (CIPRO) 500 MG tablet Take 1 tablet (500 mg total) by mouth every 12 (twelve) hours for 7 days. 07/09/22 07/16/22 Yes Louretta Shorten, [place], FNP  fluconazole (DIFLUCAN) 200 MG tablet Take 1 tablet (200 mg total) by mouth once as needed for up to 1 dose. 07/09/22  Yes Louretta Shorten, [place], FNP  aspirin EC 81 MG tablet Take 81 mg by mouth daily.    [provider]  benzonatate  (TESSALON) 100 MG capsule Take 1 capsule (100 mg total) by mouth 3 (three) times daily as needed for cough. 03/19/22   Barrett Henle, MD  Blood Glucose Monitoring Suppl [facility] VERIO) w/Device KIT Use to test blood sugar 3 times a day 02/09/22   Charlott Rakes, MD  buPROPion (WELLBUTRIN SR) 150 MG 12 hr tablet TAKE ONE TABLET BY MOUTH TWICE DAILY 05/14/22   Charlott Rakes, MD  cetirizine (ZYRTEC) 10 MG tablet TAKE 1 TABLET BY MOUTH EVERY DAY 08/02/19   Charlott Rakes, MD  clotrimazole (LOTRIMIN) 1 % cream APPLY TO THE AFFECTED AREA(S) beneath BOTH breasts TWICE DAILY 03/08/22   Charlott Rakes, MD  diclofenac Sodium (VOLTAREN) 1 % GEL Apply 4 g topically 4 (four) times daily. 05/05/21   Charlott Rakes, MD  dicyclomine (BENTYL) 10 MG capsule Take 1 capsule (10 mg total) by mouth daily. 02/12/22   Charlott Rakes, MD  fenofibrate (TRICOR) 145 MG tablet TAKE ONE TABLET BY MOUTH EVERY EVENING 05/14/22   Charlott Rakes, MD  FLUoxetine (PROZAC) 20 MG capsule TAKE ONE TABLET BY MOUTH EVERY MORNING 05/14/22   Charlott Rakes, MD  furosemide (LASIX) 20 MG tablet Take 1 tablet (20 mg total) by mouth daily. 11/12/21   Charlott Rakes, MD  gabapentin (NEURONTIN) 300 MG capsule TAKE TWO CAPSULES BY MOUTH TWICE DAILY 01/05/22   Charlott Rakes, MD  glucose blood (ONETOUCH VERIO) test strip Use as instructed 02/09/22   Charlott Rakes, MD  Insulin Pen Needle (COMFORT EZ PEN NEEDLES) 31G X 5 MM MISC USE AS DIRECTED ONCE DAILY 09/16/21   Charlott Rakes, MD  Lancets [facility] DELICA PLUS [id number]) MISC Use to test blood sugar 3 times a day 02/09/22   Charlott Rakes, MD  Lancets Misc. (ACCU-CHEK FASTCLIX LANCET) KIT USE AS DIRECTED 11/12/21   Charlott Rakes, MD  LANTUS SOLOSTAR 100 UNIT/ML Solostar Pen Inject 50 units into THE SKIN AT bedtime 06/09/22   Charlott Rakes, MD  lidocaine (LIDODERM) 5 % Place ONE PATCH onto THE SKIN daily. REMOVE & Discard PATCH WITHIN 12 hours OR as directed by MD 06/09/22   Charlott Rakes, MD  lisinopril (ZESTRIL) 40 MG tablet Take 1 tablet (40 mg total) by mouth daily. 02/25/22   Charlott Rakes, MD  metFORMIN (GLUCOPHAGE) 500 MG tablet Take 2 tablets (1,000 mg total) by mouth 2 (two) times daily with a meal. 02/25/22   Newlin, Charlane Ferretti, MD  metoCLOPramide (REGLAN) 10 MG tablet TAKE 1 TABLET BY MOUTH EVERY EIGHT HOURS AS NEEDED FOR NAUSEA OR VOMITING. 08/14/20   [name], PA-C  metoprolol succinate (TOPROL-XL) 25 MG 24 hr tablet Take 1 tablet (25 mg total) by mouth daily. Take with or immediately following a meal. 02/25/22   Charlott Rakes, MD  Multiple Vitamin (MULTIVITAMIN) tablet Take 1 tablet by mouth daily.    [provider]  mupirocin ointment (BACTROBAN) 2 % Apply 1 Application topically 2 (two) times daily. To affected area till better 03/19/22  Barrett Henle, MD  naproxen (NAPROSYN) 375 MG tablet Take 1 tablet (375 mg total) by mouth 2 (two) times daily with a meal. 07/20/21   Crain, Whitney L, PA  nitroGLYCERIN (NITROSTAT) 0.4 MG SL tablet Place 0.4 mg under the tongue every 5 (five) minutes as needed for chest pain.    [provider]  pantoprazole (PROTONIX) 40 MG tablet TAKE ONE TABLET BY MOUTH ONCE DAILY 01/13/22   Charlott Rakes, MD  PRALUENT 75 MG/ML SOAJ INJECT 75 MG into THE SKIN every 14 DAYS 06/16/21   Loel Dubonnet, NP  predniSONE (DELTASONE) 20 MG tablet Take 1 tablet (20 mg total) by mouth daily with breakfast. 06/24/22   Charlott Rakes, MD  rosuvastatin (CRESTOR) 40 MG tablet TAKE ONE TABLET BY MOUTH EVERY EVENING 01/13/22   Charlott Rakes, MD  Semaglutide, 1 MG/DOSE, 4 MG/3ML SOPN Inject 1 mg as directed once a week. For 4 weeks then increase to 61m weekly thereafter 02/25/22   NCharlott Rakes MD  Semaglutide, 2 MG/DOSE, 8 MG/3ML SOPN Inject 2 mg as directed once a week. 02/25/22   NCharlott Rakes MD  terbinafine (LAMISIL) 250 MG tablet Please take one a day x 7days, repeat every 4 weeks x 4 months 05/07/21   RWallene Huh DPM  traZODone (DESYREL) 50 MG tablet TAKE ONE TABLET BY MOUTH EVERYDAY AT BEDTIME 05/14/22   NCharlott Rakes MD  triamcinolone cream (KENALOG) 0.1 % APPLY TO THE AFFECTED AREA(S) ON BOTH LEGS TWICE DAILY 05/10/22   NCharlott Rakes MD  vitamin B-12 (CYANOCOBALAMIN) 500 MCG tablet TAKE ONE TABLET BY MOUTH ONCE DAILY 04/01/22   NCharlott Rakes MD  vitamin C (ASCORBIC ACID) 500 MG tablet Take 500 mg by mouth daily.    [provider]    Family History Family History  Problem Relation Age of Onset   Hypertension Mother    CVA Mother    COPD Mother    Heart disease Mother    Kidney Stones Mother    Heart attack Mother        MI in late 570's  Breast cancer Mother    CAD Father    Hypercholesterolemia Father    Heart attack Father        Died @ 536of MI   Diabetes Sister    Cancer Maternal Uncle    Diabetes Maternal Grandmother    Cancer Maternal Grandfather        lung cancer   Colon cancer Neg Hx    Esophageal cancer Neg Hx    Stomach cancer Neg Hx    Pancreatic cancer Neg Hx     Social History Social History   Tobacco Use   Smoking status: Every Day    Packs/day: 1.00    Years: 41.00    Total pack years: 41.00    Types: Cigarettes    Last attempt to quit: 10/07/2018    Years since quitting: 3.7   Smokeless tobacco: Never  Vaping Use   Vaping Use: Never used  Substance Use Topics   Alcohol use: No   Drug use: No     Allergies   Cymbalta [duloxetine hcl]   Review of Systems Review of Systems  Constitutional:  Negative for fatigue and fever.  Respiratory:  Negative for cough and shortness of breath.   Cardiovascular:  Negative for chest pain.  Gastrointestinal:  Negative for abdominal pain, diarrhea, nausea and vomiting.  Genitourinary:  Positive for flank pain, frequency and urgency. Negative for  decreased urine volume, difficulty urinating, dysuria, genital sores, hematuria, pelvic pain and vaginal discharge.     Physical Exam Triage  Vital Signs ED Triage Vitals  Enc Vitals Group     BP 07/09/22 1204 134/78     Pulse Rate 07/09/22 1204 60     Resp 07/09/22 1204 16     Temp 07/09/22 1204 (!) 97.5 F (36.4 C)     Temp Source 07/09/22 1204 Oral     SpO2 07/09/22 1204 96 %     Weight --      Height --      Head Circumference --      Peak Flow --      Pain Score 07/09/22 1205 0     Pain Loc --      Pain Edu? --      Excl. in [place]? --    No data found.  Updated Vital Signs BP 134/78 (BP Location: Right Arm)   Pulse 60   Temp (!) 97.5 F (36.4 C) (Oral)   Resp 16   SpO2 96%   Visual Acuity Right Eye Distance:   Left Eye Distance:   Bilateral Distance:    Right Eye Near:   Left Eye Near:    Bilateral Near:     Physical Exam Vitals and nursing note reviewed.  Constitutional:      Appearance: Normal appearance.     Comments: Pleasant 62 year old female who appears stated age.  HENT:     Head: Normocephalic and atraumatic.     Left Ear: External ear normal.     Mouth/Throat:     Mouth: Mucous membranes are moist.  Cardiovascular:     Rate and Rhythm: Normal rate and regular rhythm.     Pulses: Normal pulses.     Heart sounds: Normal heart sounds, S1 normal and S2 normal. No murmur heard. Pulmonary:     Effort: Pulmonary effort is normal. No respiratory distress.     Breath sounds: Normal breath sounds.     Comments: Lungs vesicular posteriorly. Abdominal:     Tenderness: There is no right CVA tenderness or left CVA tenderness.     Comments: Exam negative for CVA tenderness.  Musculoskeletal:        General: No swelling. Normal range of motion.  Skin:    General: Skin is warm and dry.  Neurological:     Mental Status: She is alert.  Psychiatric:        Behavior: Behavior is cooperative.      UC Treatments / Results  Labs (all labs ordered are listed, but only abnormal results are displayed) Labs Reviewed  POCT URINALYSIS DIPSTICK, ED / UC - Abnormal; Notable for the following  components:      Result Value   Glucose, UA >=1000 (*)    Nitrite POSITIVE (*)    All other components within normal limits  CBG MONITORING, ED - Abnormal; Notable for the following components:   Glucose-Capillary 420 (*)    All other components within normal limits  URINE CULTURE    EKG   Radiology No results found.  Procedures Procedures (including critical care time)  Medications Ordered in UC Medications - No data to display  Initial Impression / Assessment and Plan / UC Course  I have reviewed the triage vital signs and the nursing notes.  Pertinent labs & imaging results that were available during my care of the patient were reviewed by me and considered in my medical decision making (  see chart for details).  Urinalysis came back positive for nitrates, will send for culture.  Will treat with ciprofloxacin 500 mg twice daily for the next 7 days.  Patient is hemodynamically stable.  Without CVA tenderness.  Low concerns for pyelonephritis or systemic infection.  Given prescription for fluconazole if needed.  Glucose was 420 in clinic, patient has not yet taken insulin and is drinking a soda.  Discussed importance of treatment compliance.  Patient verbalized understanding.  Return precautions discussed.     Final Clinical Impressions(s) / UC Diagnoses   Final diagnoses:  Urinary tract infection without hematuria, site unspecified  Type 2 diabetes mellitus with hyperglycemia, with long-term current use of insulin [facility])     Discharge Instructions      Your urinalysis was consistent with a urinary tract infection.  Please take ciprofloxacin 500 mg orally twice daily for the next 7 days.  It is important that you finish all of your antibiotics.  If you develop vaginal itching, or symptoms of yeast infection you can take a fluconazole as needed.  Please seek immediate care if you develop nausea, vomiting, flank pain, fever or worsening of symptoms.  You are diabetic and it  is imperative that you monitor your glucose and take your insulin as prescribed.  Your blood sugar in the clinic today was over 400.  Please take your insulin once you get home.  Please refrain from drinking sodas with sugar, you could opt for diet or water.  To help prevent urinary tract infections in the future please drink less sodas, you can also take pure Cranberry extract which is available over-the-counter, as juices would impact your blood sugar.  Please follow-up with your primary care provider regarding your glucose control.       ED Prescriptions     Medication Sig Dispense Auth. Provider   ciprofloxacin (CIPRO) 500 MG tablet Take 1 tablet (500 mg total) by mouth every 12 (twelve) hours for 7 days. 14 tablet Louretta Shorten, [place], [place]   fluconazole (DIFLUCAN) 200 MG tablet Take 1 tablet (200 mg total) by mouth once as needed for up to 1 dose. 1 tablet Sarah Charles, [place], [place]      I have reviewed the PDMP during this encounter.   Sarah Charles, [place], [place] 07/09/22 [place], [place] N, [place] 07/09/22 [phone]

## 2022-07-12 LAB — URINE CULTURE: Culture: >=100000 — AB

## 2022-07-14 ENCOUNTER — Other Ambulatory Visit: Payer: Self-pay | Admitting: Family Medicine

## 2022-07-14 DIAGNOSIS — I739 Peripheral vascular disease, unspecified: Secondary | ICD-10-CM

## 2022-07-14 DIAGNOSIS — E1165 Type 2 diabetes mellitus with hyperglycemia: Secondary | ICD-10-CM

## 2022-07-14 DIAGNOSIS — E785 Hyperlipidemia, unspecified: Secondary | ICD-10-CM

## 2022-07-14 DIAGNOSIS — E1142 Type 2 diabetes mellitus with diabetic polyneuropathy: Secondary | ICD-10-CM

## 2022-07-14 DIAGNOSIS — I2510 Atherosclerotic heart disease of native coronary artery without angina pectoris: Secondary | ICD-10-CM

## 2022-07-14 DIAGNOSIS — K219 Gastro-esophageal reflux disease without esophagitis: Secondary | ICD-10-CM

## 2022-07-15 NOTE — Progress Notes (Deleted)
Office Visit    Patient Name: Sarah Charles Date of Encounter: 07/15/2022  Primary Care Provider:  Charlott Rakes, MD Primary Cardiologist:  Freada Bergeron, MD Primary Electrophysiologist: None  Chief Complaint    Sarah Charles is a 62 y.o. female with PMH of CAD s/p LHC with 70% stenosis in proximal LAD treated with DES x 1, HTN, sleep apnea, DM type II, GERD, tobacco abuse who presents today for lower extremity swelling.  Past Medical History    Past Medical History:  Diagnosis Date   Arthritis    Coronary artery calcification seen on CT scan    a. 07/2017 noted on CT abd.   Depression    GERD (gastroesophageal reflux disease)    Hyperlipidemia    Hypertension    Obesity    Sleep apnea    a. Does not use CPAP "cause i dont think I need it anymore".   Tobacco abuse    Type II diabetes mellitus ([place])    Past Surgical History:  Procedure Laterality Date   CHOLECYSTECTOMY     CORONARY STENT INTERVENTION N/A 11/04/2017   Procedure: CORONARY STENT INTERVENTION;  Surgeon: Martinique, Peter M, MD;  Location: [facility];  Service: Cardiovascular;  Laterality: N/A;   INCISION AND DRAINAGE ABSCESS Left 12/22/2012   Procedure: INCISION AND DRAINAGE ABSCESS;  Surgeon: Jamesetta So, MD;  Location: [facility];  Service: General;  Laterality: Left;   INTRAVASCULAR PRESSURE WIRE/FFR STUDY N/A 11/04/2017   Procedure: INTRAVASCULAR PRESSURE WIRE/FFR STUDY;  Surgeon: Martinique, Peter M, MD;  Location: [facility];  Service: Cardiovascular;  Laterality: N/A;   LEFT HEART CATH AND CORONARY ANGIOGRAPHY N/A 11/04/2017   Procedure: LEFT HEART CATH AND CORONARY ANGIOGRAPHY;  Surgeon: Martinique, Peter M, MD;  Location: [facility];  Service: Cardiovascular;  Laterality: N/A;   TONSILLECTOMY     TUBAL LIGATION      Allergies  Allergies  Allergen Reactions   Cymbalta [Duloxetine Hcl] Nausea Only    Nausea, lack of therapeutic effect    History of Present Illness    Sarah Charles   is a 62 year old female with the above mention past medical history who presents today for follow-up of coronary artery disease.  Ms. Shabbir presented to the ED on 10/2017 at any pain with complaint of chest pressure.  She has a significant family history of CAD and ACS workup was completed which was negative.  She followed up with cardiology and had a CTA completed which showed moderate stenosis of 50-59% in mid LAD and FFR was submitted showing significant stenosis in mid LAD.  She was referred for [facility] which was performed by Dr. Martinique that revealed 75% stenosis of proximal LAD that was treated with DES x 1.  She developed myalgias on atorvastatin and was transition to Crestor.  She was started on Praluent 75 mg but at follow-up patient was unsure if she has started medication.  She eventually started prior to she was seen by Christen Bame, NP on 10/09/2021 for complaint of leg swelling.  She reported bilateral lower extremity edema for few weeks.  She had continued swelling after 5 days of Lasix 20 mg.  She admitted to eating very high sodium diet.  She had her Lasix doubled to 40 mg x 3 days and 2D echo was ordered that showed hyperdynamic EF of 70-75% with no RWMA and grade 1 DD with moderately dilated LA and mild MVR.  Since last being seen  in the office patient reports***.  Patient denies chest pain, palpitations, dyspnea, PND, orthopnea, nausea, vomiting, dizziness, syncope, edema, weight gain, or early satiety.     ***Notes:  Home Medications    Current Outpatient Medications  Medication Sig Dispense Refill   aspirin EC 81 MG tablet Take 81 mg by mouth daily.     benzonatate (TESSALON) 100 MG capsule Take 1 capsule (100 mg total) by mouth 3 (three) times daily as needed for cough. 21 capsule 0   Blood Glucose Monitoring Suppl (ONETOUCH VERIO) w/Device KIT Use to test blood sugar 3 times a day 1 kit 0   buPROPion (WELLBUTRIN SR) 150 MG 12 hr tablet TAKE ONE TABLET BY MOUTH TWICE DAILY 180  tablet 0   cetirizine (ZYRTEC) 10 MG tablet TAKE 1 TABLET BY MOUTH EVERY DAY 30 tablet 2   ciprofloxacin (CIPRO) 500 MG tablet Take 1 tablet (500 mg total) by mouth every 12 (twelve) hours for 7 days. 14 tablet 0   clotrimazole (LOTRIMIN) 1 % cream APPLY TO THE AFFECTED AREA(S) beneath BOTH breasts TWICE DAILY 60 g 1   diclofenac Sodium (VOLTAREN) 1 % GEL Apply 4 g topically 4 (four) times daily. 100 g 1   dicyclomine (BENTYL) 10 MG capsule Take 1 capsule (10 mg total) by mouth daily. 30 capsule 1   fenofibrate (TRICOR) 145 MG tablet TAKE ONE TABLET BY MOUTH EVERY EVENING 90 tablet 0   fluconazole (DIFLUCAN) 200 MG tablet Take 1 tablet (200 mg total) by mouth once as needed for up to 1 dose. 1 tablet 0   FLUoxetine (PROZAC) 20 MG capsule TAKE ONE TABLET BY MOUTH EVERY MORNING 90 capsule 0   furosemide (LASIX) 20 MG tablet Take 1 tablet (20 mg total) by mouth daily. 90 tablet 1   gabapentin (NEURONTIN) 300 MG capsule TAKE TWO CAPSULES BY MOUTH TWICE DAILY 360 capsule 2   glucose blood (ONETOUCH VERIO) test strip Use as instructed 100 each 12   Insulin Pen Needle (COMFORT EZ PEN NEEDLES) 31G X 5 MM MISC USE AS DIRECTED ONCE DAILY 100 each 0   Lancets (ONETOUCH DELICA PLUS 123XX123) MISC Use to test blood sugar 3 times a day 100 each 12   Lancets Misc. (ACCU-CHEK FASTCLIX LANCET) KIT USE AS DIRECTED 1 kit 11   LANTUS SOLOSTAR 100 UNIT/ML Solostar Pen Inject 50 units into THE SKIN AT bedtime 45 mL 6   lidocaine (LIDODERM) 5 % Place ONE PATCH onto THE SKIN daily. REMOVE & Discard PATCH WITHIN 12 hours OR as directed by MD 30 patch 6   lisinopril (ZESTRIL) 40 MG tablet Take 1 tablet (40 mg total) by mouth daily. 90 tablet 1   metFORMIN (GLUCOPHAGE) 500 MG tablet Take 2 tablets (1,000 mg total) by mouth 2 (two) times daily with a meal. 360 tablet 1   metoCLOPramide (REGLAN) 10 MG tablet TAKE 1 TABLET BY MOUTH EVERY EIGHT HOURS AS NEEDED FOR NAUSEA OR VOMITING. 40 tablet 0   metoprolol succinate  (TOPROL-XL) 25 MG 24 hr tablet Take 1 tablet (25 mg total) by mouth daily. Take with or immediately following a meal. 90 tablet 1   Multiple Vitamin (MULTIVITAMIN) tablet Take 1 tablet by mouth daily.     mupirocin ointment (BACTROBAN) 2 % Apply 1 Application topically 2 (two) times daily. To affected area till better 22 g 0   naproxen (NAPROSYN) 375 MG tablet Take 1 tablet (375 mg total) by mouth 2 (two) times daily with a meal. 20 tablet 0  nitroGLYCERIN (NITROSTAT) 0.4 MG SL tablet Place 0.4 mg under the tongue every 5 (five) minutes as needed for chest pain.     pantoprazole (PROTONIX) 40 MG tablet TAKE ONE TABLET BY MOUTH ONCE DAILY 90 tablet 0   PRALUENT 75 MG/ML SOAJ INJECT 75 MG into THE SKIN every 14 DAYS 2 mL 11   predniSONE (DELTASONE) 20 MG tablet Take 1 tablet (20 mg total) by mouth daily with breakfast. 5 tablet 0   rosuvastatin (CRESTOR) 40 MG tablet TAKE ONE TABLET BY MOUTH EVERY EVENING 90 tablet 0   Semaglutide, 1 MG/DOSE, 4 MG/3ML SOPN Inject 1 mg as directed once a week. For 4 weeks then increase to '2mg'$  weekly thereafter 3 mL 0   Semaglutide, 2 MG/DOSE, 8 MG/3ML SOPN Inject 2 mg as directed once a week. 3 mL 6   terbinafine (LAMISIL) 250 MG tablet Please take one a day x 7days, repeat every 4 weeks x 4 months 28 tablet 0   traZODone (DESYREL) 50 MG tablet TAKE ONE TABLET BY MOUTH EVERYDAY AT BEDTIME 90 tablet 0   triamcinolone cream (KENALOG) 0.1 % APPLY TO THE AFFECTED AREA(S) ON BOTH LEGS TWICE DAILY 45 g 0   vitamin B-12 (CYANOCOBALAMIN) 500 MCG tablet TAKE ONE TABLET BY MOUTH ONCE DAILY 90 tablet 0   vitamin C (ASCORBIC ACID) 500 MG tablet Take 500 mg by mouth daily.     No current facility-administered medications for this visit.     Review of Systems  Please see the history of present illness.    (+)*** (+)***  All other systems reviewed and are otherwise negative except as noted above.  Physical Exam    Wt Readings from Last 3 Encounters:  06/14/22 147 lb  11.3 oz (67 kg)  04/24/22 148 lb (67.1 kg)  02/25/22 199 lb 12.8 oz (90.6 kg)   [id number] were no vitals filed for this visit.,There is no height or weight on file to calculate BMI.  Constitutional:      Appearance: Healthy appearance. Not in distress.  Neck:     Vascular: JVD normal.  Pulmonary:     Effort: Pulmonary effort is normal.     Breath sounds: No wheezing. No rales. Diminished in the bases Cardiovascular:     Normal rate. Regular rhythm. Normal S1. Normal S2.      Murmurs: There is no murmur.  Edema:    Peripheral edema absent.  Abdominal:     Palpations: Abdomen is soft non tender. There is no hepatomegaly.  Skin:    General: Skin is warm and dry.  Neurological:     General: No focal deficit present.     Mental Status: Alert and oriented to person, place and time.     Cranial Nerves: Cranial nerves are intact.  EKG/LABS/Other Studies Reviewed    ECG personally reviewed by me today - ***  Risk Assessment/Calculations:   {Does this patient have ATRIAL FIBRILLATION?:[id number]}        Lab Results  Component Value Date   WBC 6.6 06/14/2022   HGB 12.9 06/15/2022   HCT 38.0 06/15/2022   MCV 87.4 06/14/2022   PLT 211 06/14/2022   Lab Results  Component Value Date   CREATININE 0.89 06/14/2022   BUN 20 06/14/2022   NA 135 06/15/2022   K 3.6 06/15/2022   CL 93 (L) 06/14/2022   CO2 28 06/14/2022   Lab Results  Component Value Date   ALT 14 04/24/2022   AST 16 04/24/2022  ALKPHOS 72 04/24/2022   BILITOT <0.1 (L) 04/24/2022   Lab Results  Component Value Date   CHOL 137 11/12/2021   HDL 54 11/12/2021   LDLCALC 64 11/12/2021   TRIG 102 11/12/2021   CHOLHDL 3.9 08/14/2020    Lab Results  Component Value Date   HGBA1C 7.4 (A) 02/25/2022    Assessment & Plan    1.  Coronary artery disease: -s/p LHC performed 10/2017 with DES x 1 to proximal LAD for stenosis of 75%. -Today patient reports*** -Continue GDMT with ASA 81 mg, fenofibrate 145 mg,  Crestor 40 mg daily, and Praluent 75 mg daily, Toprol 25 mg daily  2.  Lower extremity swelling: -2D echo was also completed and showed EF of 70-35% hyperdynamic with no RWMA and moderately dilated LA with mild MV regurgitation.Marland Kitchen -Patient presented on 09/2021 with lower extremity edema and Lasix dose was increased with patient encouraged to reduce sodium intake and elevate extremities. -Continue***  3.  Hyperlipidemia: -Patient's LDL cholesterol was 64 -Continue***   4.  Essential hypertension: -Patient's blood pressure today was*** -Continue Toprol-XL 25 mg, lisinopril 40 mg  5.  DM type II: -Last hemoglobin A1c was*** -Continue       Disposition: Follow-up with Freada Bergeron, MD or APP in *** months {Are you ordering a CV Procedure (e.g. stress test, cath, DCCV, TEE, etc)?   Press F2        :[id number]   Medication Adjustments/Labs and Tests Ordered: Current medicines are reviewed at length with the patient today.  Concerns regarding medicines are outlined above.   Signed, Mable Fill, Marissa Nestle, NP 07/15/2022, 7:07 PM [facility]  Note:  This document was prepared using Dragon voice recognition software and may include unintentional dictation errors.

## 2022-07-16 ENCOUNTER — Ambulatory Visit: Payer: 59 | Admitting: Nurse Practitioner

## 2022-07-16 DIAGNOSIS — E785 Hyperlipidemia, unspecified: Secondary | ICD-10-CM

## 2022-07-16 DIAGNOSIS — M7989 Other specified soft tissue disorders: Secondary | ICD-10-CM

## 2022-07-16 DIAGNOSIS — E1169 Type 2 diabetes mellitus with other specified complication: Secondary | ICD-10-CM

## 2022-07-16 DIAGNOSIS — I10 Essential (primary) hypertension: Secondary | ICD-10-CM

## 2022-07-16 DIAGNOSIS — I25118 Atherosclerotic heart disease of native coronary artery with other forms of angina pectoris: Secondary | ICD-10-CM

## 2022-07-18 NOTE — Progress Notes (Unsigned)
Cardiology Office Note:    Date:  07/19/2022   ID:  [name], DOB 1960-12-18, MRN [id number]  PCP:  Charlott Rakes, MD  [facility] Cardiologist:  Freada Bergeron, MD    Referring MD: Charlott Rakes, MD   Patient Profile: Coronary artery disease CCTA 10/31/2017: CAC score 296 (98th percentile), mid LAD 50-69 S/p 2.5 x 15 mm DES to the LAD on 11/04/2017 (Stable Ischemic Heart Dz) LHC 11/04/17: LAD p70 (FFR 0.78), LCx proximal 30, RCA proximal 20 TTE 10/29/2021: EF 70-75, no RWMA, GR 1 DD, normal RVSF, mod LAE, mild MR, RAP 3   Diabetes mellitus Hypertension Hyperlipidemia OSA Tobacco use Lower extremity edema  RLE [place] 09/2021: No DVT  History of Present Illness:   Sarah Charles is a 62 y.o. female with the above problem list.  She was last seen in clinic by Christen Bame, NP 10/09/21. She was continued on Lasix for edema. Echocardiogram showed normal EF. She returns for f/u on CAD. She is here alone. She has not had chest pain, shortness of breath, syncope, orthopnea. She takes Lasix as needed for swelling.   EKG done in the ED on 04/24/2022 personally reviewed and interpreted: NSR, HR 79, normal axis, no ST-T wave changes, QTc 433  Subjective    Reviewed and updated this encounter:   Tobacco  Allergies  Meds  Problems  Med Hx  Surg Hx  Fam Hx     ROS  Objective   Labs/Other Test Reviewed:   Recent Labs: 08/28/2021: TSH 1.977 02/25/2022: NT-Pro BNP 71 04/24/2022: ALT 14; B Natriuretic Peptide 74.6 06/14/2022: BUN 20; Creatinine, Ser 0.89; Platelets 211 06/15/2022: Hemoglobin 12.9; Potassium 3.6; Sodium 135   Recent Lipid Panel Recent Labs    11/12/21 1018  CHOL 137  TRIG 102  HDL 54  LDLCALC 64     Risk Assessment/Calculations/Metrics:             Physical Exam:   VS:  BP 103/60   Pulse 66   Ht '5\' 3"'$  (1.6 m)   Wt 181 lb 9.6 oz (82.4 kg)   SpO2 93%   BMI 32.17 kg/m    Wt Readings from Last 3 Encounters:  07/19/22 181 lb 9.6 oz (82.4  kg)  06/14/22 147 lb 11.3 oz (67 kg)  04/24/22 148 lb (67.1 kg)    Constitutional:      Appearance: Healthy appearance. Not in distress.  Neck:     Vascular: JVD normal.  Pulmonary:     Breath sounds: Normal breath sounds. No rales.  Cardiovascular:     Normal rate. Regular rhythm. Normal S1. Normal S2.      Murmurs: There is no murmur.  Edema:    Peripheral edema present.    Pretibial: bilateral trace edema of the pretibial area. Abdominal:     Palpations: Abdomen is soft.      Assessment & Plan    ASSESSMENT & PLAN:   CAD (coronary artery disease) S/p DES to the LAD in 10/2017 in the setting of stable ischemic heart disease. She is doing well w/o recurrent angina. Continue ASA 81 mg once daily, Praluent 75 mg every 2 weeks, Crestor 40 mg, Toprol XL 25 mg. F/u 1 year.   Hypertension The patient's blood pressure is controlled on her current regimen.  Continue current therapy with lisinopril 40 mg daily, Toprol-XL 25 mg daily.  Hyperlipidemia LDL goal <70 LDL optimal on most recent lab work.  Continue current Rx with Praluent 75 mg  every 2 weeks, Tricor 145 mg every day, Crestor 40 mg daily.  TOBACCO DEPENDENCE I have encouraged her to try to quit.  She notes that she does not think she will ever be ready.          Dispo:  Return in about 1 year (around 07/20/2023) for Routine Follow Up, w/ Dr. Johney Frame.   Signed, Richardson Dopp, PA-C  07/19/2022 3:40 PM    [facility] [address], [place], [place]  [address] Phone: [phone]; Fax: [phone]

## 2022-07-19 ENCOUNTER — Encounter: Payer: Self-pay | Admitting: Physician Assistant

## 2022-07-19 ENCOUNTER — Ambulatory Visit
Admission: RE | Admit: 2022-07-19 | Discharge: 2022-07-19 | Disposition: A | Discharge status: Home / Self Care | Payer: 59 | Source: Ambulatory Visit | Attending: Nurse Practitioner | Admitting: Nurse Practitioner

## 2022-07-19 ENCOUNTER — Ambulatory Visit: Payer: 59 | Attending: Nurse Practitioner | Admitting: Physician Assistant

## 2022-07-19 VITALS — BP 103/60 | HR 66 | Ht 63.0 in | Wt 181.6 lb

## 2022-07-19 DIAGNOSIS — Z1231 Encounter for screening mammogram for malignant neoplasm of breast: Secondary | ICD-10-CM

## 2022-07-19 DIAGNOSIS — I2510 Atherosclerotic heart disease of native coronary artery without angina pectoris: Secondary | ICD-10-CM | POA: Diagnosis not present

## 2022-07-19 DIAGNOSIS — F17200 Nicotine dependence, unspecified, uncomplicated: Secondary | ICD-10-CM

## 2022-07-19 DIAGNOSIS — E785 Hyperlipidemia, unspecified: Secondary | ICD-10-CM | POA: Diagnosis not present

## 2022-07-19 DIAGNOSIS — I10 Essential (primary) hypertension: Secondary | ICD-10-CM

## 2022-07-19 NOTE — Assessment & Plan Note (Signed)
The patient's blood pressure is controlled on her current regimen.  Continue current therapy with lisinopril 40 mg daily, Toprol-XL 25 mg daily.

## 2022-07-19 NOTE — Assessment & Plan Note (Signed)
S/p DES to the LAD in 10/2017 in the setting of stable ischemic heart disease. She is doing well w/o recurrent angina. Continue ASA 81 mg once daily, Praluent 75 mg every 2 weeks, Crestor 40 mg, Toprol XL 25 mg. F/u 1 year.

## 2022-07-19 NOTE — Assessment & Plan Note (Signed)
I have encouraged her to try to quit.  She notes that she does not think she will ever be ready.

## 2022-07-19 NOTE — Patient Instructions (Signed)
Medication Instructions:  Your physician recommends that you continue on your current medications as directed. Please refer to the Current Medication list given to you today.  *If you need a refill on your cardiac medications before your next appointment, please call your pharmacy*   Lab Work: None ordered  If you have labs (blood work) drawn today and your tests are completely normal, you will receive your results only by: [place] (if you have MyChart) OR A paper copy in the mail If you have any lab test that is abnormal or we need to change your treatment, we will call you to review the results.   Testing/Procedures: None ordered   Follow-Up: At [facility], you and your health needs are our priority.  As part of our continuing mission to provide you with exceptional heart care, we have created designated Provider Care Teams.  These Care Teams include your primary Cardiologist (physician) and Advanced Practice Providers (APPs -  Physician Assistants and Nurse Practitioners) who all work together to provide you with the care you need, when you need it.  We recommend signing up for the patient portal called "MyChart".  Sign up information is provided on this After Visit Summary.  MyChart is used to connect with patients for Virtual Visits (Telemedicine).  Patients are able to view lab/test results, encounter notes, upcoming appointments, etc.  Non-urgent messages can be sent to your provider as well.   To learn more about what you can do with MyChart, go to [email or web address].    Your next appointment:   1 year(s)  Provider:   Freada Bergeron, MD     Other Instructions

## 2022-07-19 NOTE — Assessment & Plan Note (Signed)
LDL optimal on most recent lab work.  Continue current Rx with Praluent 75 mg every 2 weeks, Tricor 145 mg every day, Crestor 40 mg daily.

## 2022-07-30 ENCOUNTER — Telehealth: Payer: Self-pay

## 2022-07-30 NOTE — Telephone Encounter (Signed)
Patient was called and informed of form being ready for pick up.  Pt requested to have form mailed.

## 2022-08-03 ENCOUNTER — Other Ambulatory Visit: Payer: Self-pay | Admitting: Family Medicine

## 2022-08-03 DIAGNOSIS — E1159 Type 2 diabetes mellitus with other circulatory complications: Secondary | ICD-10-CM

## 2022-08-03 NOTE — Telephone Encounter (Signed)
Unable to refill per protocol, Rx request is too soon. Last refill 02/25/22 for 90 and 1 refill. Next refill due in April.  Requested Prescriptions  Pending Prescriptions Disp Refills   lisinopril (ZESTRIL) 40 MG tablet [Pharmacy Med Name: lisinopril 40 mg tablet] 90 tablet 1    Sig: TAKE ONE TABLET BY MOUTH ONCE DAILY     Cardiovascular:  ACE Inhibitors Passed - 08/03/2022  8:04 AM      Passed - Cr in normal range and within 180 days    Creat  Date Value Ref Range Status  07/23/2016 0.50 0.50 - 1.05 mg/dL Final    Comment:      For patients > or = 61 years of age: The upper reference limit for Creatinine is approximately 13% higher for people identified as African-American.      Creatinine, Ser  Date Value Ref Range Status  06/14/2022 0.89 0.44 - 1.00 mg/dL Final   Creatinine, POC  Date Value Ref Range Status  09/16/2016 100 mg/dL Final   Creatinine, Urine  Date Value Ref Range Status  07/23/2016 47 20 - 320 mg/dL Final         Passed - K in normal range and within 180 days    Potassium  Date Value Ref Range Status  06/15/2022 3.6 3.5 - 5.1 mmol/L Final         Passed - Patient is not pregnant      Passed - Last BP in normal range    BP Readings from Last 1 Encounters:  07/19/22 103/60         Passed - Valid encounter within last 6 months    Recent Outpatient Visits           1 month ago Acute cough   [place], [place], MD   1 month ago Vaginal candidiasis   [place], [place], MD   5 months ago Type 2 diabetes mellitus with other specified complication, with long-term current use of insulin ([place])   [place], [place], MD   8 months ago Type 2 diabetes mellitus with other specified complication, with long-term current use of insulin [facility])   [place], [place], MD   9 months ago Type 2  diabetes mellitus with other specified complication, with long-term current use of insulin [facility])   [place], [place], [place]

## 2022-08-04 ENCOUNTER — Other Ambulatory Visit: Payer: Self-pay | Admitting: Family Medicine

## 2022-08-04 DIAGNOSIS — E1169 Type 2 diabetes mellitus with other specified complication: Secondary | ICD-10-CM

## 2022-08-04 DIAGNOSIS — E1159 Type 2 diabetes mellitus with other circulatory complications: Secondary | ICD-10-CM

## 2022-08-04 DIAGNOSIS — F32A Depression, unspecified: Secondary | ICD-10-CM

## 2022-08-04 NOTE — Telephone Encounter (Signed)
Requested Prescriptions  Pending Prescriptions Disp Refills   metFORMIN (GLUCOPHAGE) 500 MG tablet [Pharmacy Med Name: metformin 500 mg tablet] 360 tablet 0    Sig: TAKE TWO TABLETS BY MOUTH TWICE DAILY with A meal     Endocrinology:  Diabetes - Biguanides Failed - 08/04/2022  8:04 AM      Failed - B12 Level in normal range and within 720 days    Vitamin B-12  Date Value Ref Range Status  03/23/2018 827 232 - 1,245 pg/mL Final         Passed - Cr in normal range and within 360 days    Creat  Date Value Ref Range Status  07/23/2016 0.50 0.50 - 1.05 mg/dL Final    Comment:      For patients > or = 62 years of age: The upper reference limit for Creatinine is approximately 13% higher for people identified as African-American.      Creatinine, Ser  Date Value Ref Range Status  06/14/2022 0.89 0.44 - 1.00 mg/dL Final   Creatinine, POC  Date Value Ref Range Status  09/16/2016 100 mg/dL Final   Creatinine, Urine  Date Value Ref Range Status  07/23/2016 47 20 - 320 mg/dL Final         Passed - HBA1C is between 0 and 7.9 and within 180 days    HbA1c, POC (controlled diabetic range)  Date Value Ref Range Status  02/25/2022 7.4 (A) 0.0 - 7.0 % Final         Passed - eGFR in normal range and within 360 days    GFR, Est African American  Date Value Ref Range Status  07/23/2016 >89 >=60 mL/min Final   GFR calc Af Amer  Date Value Ref Range Status  12/10/2019 >60 >60 mL/min Final   GFR, Est Non African American  Date Value Ref Range Status  07/23/2016 >89 >=60 mL/min Final   GFR, Estimated  Date Value Ref Range Status  06/14/2022 >60 >60 mL/min Final    Comment:    (NOTE) Calculated using the CKD-EPI Creatinine Equation (2021)    eGFR  Date Value Ref Range Status  02/25/2022 97 >59 mL/min/1.73 Final         Passed - Valid encounter within last 6 months    Recent Outpatient Visits           1 month ago Acute cough   [place], Charlane Ferretti, MD   1 month ago Vaginal candidiasis   [place], Charlane Ferretti, MD   5 months ago Type 2 diabetes mellitus with other specified complication, with long-term current use of insulin ([place])   [place], Charlane Ferretti, MD   8 months ago Type 2 diabetes mellitus with other specified complication, with long-term current use of insulin ([place])   [place], Leith-Hatfield, MD   10 months ago Type 2 diabetes mellitus with other specified complication, with long-term current use of insulin [facility])   [place], [place], [place]              Passed - CBC within normal limits and completed in the last 12 months    WBC  Date Value Ref Range Status  06/14/2022 6.6 4.0 - 10.5 K/uL Final   RBC  Date Value Ref Range Status  06/14/2022 4.52 3.87 - 5.11  MIL/uL Final   Hemoglobin  Date Value Ref Range Status  06/15/2022 12.9 12.0 - 15.0 g/dL Final  11/09/2018 14.2 11.1 - 15.9 g/dL Final   HCT  Date Value Ref Range Status  06/15/2022 38.0 36.0 - 46.0 % Final   Hematocrit  Date Value Ref Range Status  11/09/2018 42.8 34.0 - 46.6 % Final   MCHC  Date Value Ref Range Status  06/14/2022 34.7 30.0 - 36.0 g/dL Final   [facility]  Date Value Ref Range Status  06/14/2022 30.3 26.0 - 34.0 pg Final   MCV  Date Value Ref Range Status  06/14/2022 87.4 80.0 - 100.0 fL Final  11/09/2018 93 79 - 97 fL Final   No results found for: "PLTCOUNTKUC", "LABPLAT", "POCPLA" RDW  Date Value Ref Range Status  06/14/2022 12.6 11.5 - 15.5 % Final  11/09/2018 12.5 11.7 - 15.4 % Final          metoprolol succinate (TOPROL-XL) 25 MG 24 hr tablet [Pharmacy Med Name: metoprolol succinate ER 25 mg tablet,extended release 24 hr] 90 tablet 0    Sig: TAKE ONE TABLET BY MOUTH EVERY MORNING with OR immediately following A meal     Cardiovascular:   Beta Blockers Passed - 08/04/2022  8:04 AM      Passed - Last BP in normal range    BP Readings from Last 1 Encounters:  07/19/22 103/60         Passed - Last Heart Rate in normal range    Pulse Readings from Last 1 Encounters:  07/19/22 66         Passed - Valid encounter within last 6 months    Recent Outpatient Visits           1 month ago Acute cough   [place], Charlane Ferretti, MD   1 month ago Vaginal candidiasis   [place], Charlane Ferretti, MD   5 months ago Type 2 diabetes mellitus with other specified complication, with long-term current use of insulin ([place])   [place], Charlane Ferretti, MD   8 months ago Type 2 diabetes mellitus with other specified complication, with long-term current use of insulin ([place])   [place], Charlane Ferretti, MD   10 months ago Type 2 diabetes mellitus with other specified complication, with long-term current use of insulin [facility])   [place], Levada Dy M, [place]               traZODone (DESYREL) 50 MG tablet [Pharmacy Med Name: trazodone 50 mg tablet] 90 tablet 0    Sig: TAKE ONE TABLET BY MOUTH EVERYDAY AT BEDTIME     Psychiatry: Antidepressants - Serotonin Modulator Passed - 08/04/2022  8:04 AM      Passed - Completed PHQ-2 or PHQ-9 in the last 360 days      Passed - Valid encounter within last 6 months    Recent Outpatient Visits           1 month ago Acute cough   [place], Enobong, MD   1 month ago Vaginal candidiasis   [place], Shirley, MD   5 months ago Type 2 diabetes mellitus with other specified complication, with long-term current use of insulin [facility])   [place],  Enobong, MD   8 months ago Type 2 diabetes  mellitus with other specified complication, with long-term current use of insulin ([place])   [place], Clio, MD   10 months ago Type 2 diabetes mellitus with other specified complication, with long-term current use of insulin [facility])   [place], [place], [place]

## 2022-08-09 ENCOUNTER — Other Ambulatory Visit: Payer: Self-pay | Admitting: Family Medicine

## 2022-08-09 DIAGNOSIS — E1159 Type 2 diabetes mellitus with other circulatory complications: Secondary | ICD-10-CM

## 2022-08-09 DIAGNOSIS — F32A Depression, unspecified: Secondary | ICD-10-CM

## 2022-08-09 DIAGNOSIS — E1169 Type 2 diabetes mellitus with other specified complication: Secondary | ICD-10-CM

## 2022-08-10 ENCOUNTER — Other Ambulatory Visit: Payer: Self-pay | Admitting: Family Medicine

## 2022-08-10 ENCOUNTER — Telehealth: Payer: Self-pay | Admitting: Emergency Medicine

## 2022-08-10 ENCOUNTER — Ambulatory Visit: Payer: Self-pay | Admitting: *Deleted

## 2022-08-10 DIAGNOSIS — M545 Low back pain, unspecified: Secondary | ICD-10-CM

## 2022-08-10 NOTE — Telephone Encounter (Signed)
Prescription for Prozac and Wellbutrin was sent to the pharmacy yesterday.

## 2022-08-10 NOTE — Telephone Encounter (Signed)
Call placed to patient unable to reach message left on VM.   

## 2022-08-10 NOTE — Telephone Encounter (Signed)
Copied from [place] [id number]. Topic: General - Other >> Aug 10, 2022  2:16 PM [place] B wrote: Reason for CRM: patient returned call back about requestilng nerve pills. Please call back

## 2022-08-10 NOTE — Telephone Encounter (Signed)
Summary: wants medication   Pt called saying her mother is in stage of passing away.  Pt is asking for "nerve pills".  She said she cannot come in right now.  [facility]  [phone]       Chief Complaint: medication requesting "nerve pills" due to impending death of her mother  Symptoms: grief crying  Frequency: na Pertinent Negatives: Patient denies na Disposition: [] ED /[] Urgent Care (no appt availability in office) / [] Appointment(In office/virtual)/ []  [place] [facility]/ [] Home Care/ [] Refused Recommended Disposition /[] [place] [facility]/ [x]  Follow-up with PCP Additional Notes:   Recommended OV and patient reports she is not able to come in for OV due to not wanting to leave mother that is dying. Please advise. Patient requesting a call back      Reason for Disposition  Prescription request for new medicine (not a refill)  Answer Assessment - Initial Assessment Questions 1. NAME of MEDICINE: "What medicine(s) are you calling about?"     Patient would like a "nerve pill" prescribed due to her mother is dying.  2. QUESTION: "What is your question?" (e.g., double dose of medicine, side effect)     Can medication be prescribed for "nerves" due to patient's mother dying and dealing with death. 3. PRESCRIBER: "Who prescribed the medicine?" Reason: if prescribed by specialist, call should be referred to that group.     na 4. SYMPTOMS: "Do you have any symptoms?" If Yes, ask: "What symptoms are you having?"  "How bad are the symptoms (e.g., mild, moderate, severe)     Grief from future death of patient's mother  10. PREGNANCY:  "Is there any chance that you are pregnant?" "When was your last menstrual period?"     na  Protocols used: Medication Question Call-A-AH

## 2022-08-11 NOTE — Telephone Encounter (Signed)
Patient aware of Prescription for Prozac and Wellbutrin was sent to the pharmacy([facility] [place], [place] )

## 2022-08-11 NOTE — Telephone Encounter (Signed)
Patient has been informed of medication being sent to the pharmacy.

## 2022-09-10 ENCOUNTER — Telehealth: Payer: Self-pay | Admitting: Family Medicine

## 2022-09-10 NOTE — Telephone Encounter (Signed)
Copied from CRM [id number]. Topic: General - Inquiry >> Sep 10, 2022 12:07 PM Haroldine Laws wrote: Reason for CRM: pt called asking the nurse if she will make a list of all the current medications that she should be on and then mail them to her address.  CB@  [phone]

## 2022-09-13 ENCOUNTER — Encounter: Payer: Self-pay | Admitting: Family Medicine

## 2022-09-14 NOTE — Telephone Encounter (Signed)
Medication list has been mailed to address on file.

## 2022-09-17 ENCOUNTER — Telehealth: Payer: Self-pay

## 2022-09-17 NOTE — Telephone Encounter (Signed)
Patient is not due AWV until 01/2023    Copied from CRM [id number]. Topic: Medicare AWV >> Sep 15, 2022 12:33 PM Ja-Kwan M wrote: Reason for CRM: Pt called with a [facility] Medicare agent requesting to schedule an appt for Medicare AWV. Cb# [phone]

## 2022-09-17 NOTE — Telephone Encounter (Signed)
Order will be faxed once received.    Copied from CRM [id number]. Topic: General - Other >> Sep 17, 2022  1:18 PM Marlow Baars wrote: Reason for CRM: Madalyn Rob with [employer] called in stating she is going to fax order an order for continued glucose monitor for the patient for the provider to sign and send back. Please assist further by faxing to [phone]

## 2022-09-24 ENCOUNTER — Ambulatory Visit: Payer: 59 | Admitting: Podiatry

## 2022-10-01 ENCOUNTER — Ambulatory Visit: Payer: 59 | Admitting: Podiatry

## 2022-10-06 ENCOUNTER — Ambulatory Visit (INDEPENDENT_AMBULATORY_CARE_PROVIDER_SITE_OTHER): Payer: 59 | Admitting: Podiatry

## 2022-10-06 DIAGNOSIS — B351 Tinea unguium: Secondary | ICD-10-CM

## 2022-10-06 DIAGNOSIS — M79674 Pain in right toe(s): Secondary | ICD-10-CM | POA: Diagnosis not present

## 2022-10-06 DIAGNOSIS — M79675 Pain in left toe(s): Secondary | ICD-10-CM | POA: Diagnosis not present

## 2022-10-06 DIAGNOSIS — E1142 Type 2 diabetes mellitus with diabetic polyneuropathy: Secondary | ICD-10-CM | POA: Diagnosis not present

## 2022-10-06 NOTE — Progress Notes (Signed)
  Subjective:  Patient ID: Sarah Charles, female    DOB: 1961-04-28,  MRN: [id number]  Chief Complaint  Patient presents with   Nail Problem    Routine foot care   62 y.o. female returns for the above complaint.  Patient presents with thickened elongated dystrophic toenails x10.  Patient states that she is a diabetic that has been going on for 2 to 3 years.   There is no swelling or coldness associated.  They are painful to touch.  She would like to have them debrided down as she is not able to debride down herself.  She denies any other acute complaints.  She has not seen anyone else prior to seeing me.  Objective:  There were no vitals filed for this visit. Podiatric Exam: Vascular: dorsalis pedis and posterior tibial pulses are palpable bilateral. Capillary return is immediate. Temperature gradient is WNL. Skin turgor WNL  Sensorium: Normal Semmes Weinstein monofilament test. Normal tactile sensation bilaterally. Nail Exam: Pt has thick disfigured discolored nails with subungual debris noted bilateral entire nail hallux through fifth toenails.  Pain on palpation to the nails. Ulcer Exam: There is no evidence of ulcer or pre-ulcerative changes or infection. Orthopedic Exam: Muscle tone and strength are WNL. No limitations in general ROM. No crepitus or effusions noted. HAV  B/L.  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 Skin: No Porokeratosis. No infection or ulcers    Assessment & Plan:   1. Pain due to onychomycosis of toenails of both feet   2. Diabetic polyneuropathy associated with type 2 diabetes mellitus (HCC)      Patient was evaluated and treated and all questions answered.  Onychomycosis with pain  -Nails palliatively debrided as below. -Educated on self-care  Procedure: Nail Debridement Rationale: pain  Type of Debridement: manual, Carey debridement. Instrumentation: Nail nipper, rotary burr. Number of Nails: 10  Procedures and Treatment: Consent by patient was obtained for treatment  procedures. The patient understood the discussion of treatment and procedures well. All questions were answered thoroughly reviewed. Debridement of mycotic and hypertrophic toenails, 1 through 5 bilateral and clearing of subungual debris. No ulceration, no infection noted.  Return Visit-Office Procedure: Patient instructed to return to the office for a follow up visit 3 months for continued evaluation and treatment.  Nicholes Rough, DPM    No follow-ups on file.

## 2022-10-11 ENCOUNTER — Other Ambulatory Visit: Payer: Self-pay | Admitting: Family Medicine

## 2022-10-11 DIAGNOSIS — K219 Gastro-esophageal reflux disease without esophagitis: Secondary | ICD-10-CM

## 2022-10-11 DIAGNOSIS — E1165 Type 2 diabetes mellitus with hyperglycemia: Secondary | ICD-10-CM

## 2022-10-11 DIAGNOSIS — I2510 Atherosclerotic heart disease of native coronary artery without angina pectoris: Secondary | ICD-10-CM

## 2022-10-11 DIAGNOSIS — E1169 Type 2 diabetes mellitus with other specified complication: Secondary | ICD-10-CM

## 2022-10-11 DIAGNOSIS — I739 Peripheral vascular disease, unspecified: Secondary | ICD-10-CM

## 2022-11-03 ENCOUNTER — Emergency Department (HOSPITAL_COMMUNITY): Payer: 59

## 2022-11-03 ENCOUNTER — Encounter (HOSPITAL_COMMUNITY): Payer: Self-pay | Admitting: Pharmacy Technician

## 2022-11-03 ENCOUNTER — Other Ambulatory Visit: Payer: Self-pay

## 2022-11-03 ENCOUNTER — Emergency Department (HOSPITAL_COMMUNITY)
Admission: EM | Admit: 2022-11-03 | Discharge: 2022-11-03 | Disposition: A | Discharge status: Home / Self Care | Payer: 59 | Attending: Emergency Medicine | Admitting: Emergency Medicine

## 2022-11-03 DIAGNOSIS — Y92410 Unspecified street and highway as the place of occurrence of the external cause: Secondary | ICD-10-CM | POA: Insufficient documentation

## 2022-11-03 DIAGNOSIS — S0181XA Laceration without foreign body of other part of head, initial encounter: Secondary | ICD-10-CM | POA: Diagnosis not present

## 2022-11-03 DIAGNOSIS — Z7984 Long term (current) use of oral hypoglycemic drugs: Secondary | ICD-10-CM | POA: Insufficient documentation

## 2022-11-03 DIAGNOSIS — Z7982 Long term (current) use of aspirin: Secondary | ICD-10-CM | POA: Insufficient documentation

## 2022-11-03 DIAGNOSIS — E119 Type 2 diabetes mellitus without complications: Secondary | ICD-10-CM | POA: Insufficient documentation

## 2022-11-03 DIAGNOSIS — S32009A Unspecified fracture of unspecified lumbar vertebra, initial encounter for closed fracture: Secondary | ICD-10-CM | POA: Insufficient documentation

## 2022-11-03 DIAGNOSIS — R6889 Other general symptoms and signs: Secondary | ICD-10-CM | POA: Diagnosis not present

## 2022-11-03 DIAGNOSIS — I10 Essential (primary) hypertension: Secondary | ICD-10-CM | POA: Insufficient documentation

## 2022-11-03 DIAGNOSIS — M5136 Other intervertebral disc degeneration, lumbar region: Secondary | ICD-10-CM | POA: Diagnosis not present

## 2022-11-03 DIAGNOSIS — Z743 Need for continuous supervision: Secondary | ICD-10-CM | POA: Diagnosis not present

## 2022-11-03 DIAGNOSIS — Z7901 Long term (current) use of anticoagulants: Secondary | ICD-10-CM | POA: Insufficient documentation

## 2022-11-03 DIAGNOSIS — Z794 Long term (current) use of insulin: Secondary | ICD-10-CM | POA: Insufficient documentation

## 2022-11-03 DIAGNOSIS — I2510 Atherosclerotic heart disease of native coronary artery without angina pectoris: Secondary | ICD-10-CM | POA: Diagnosis not present

## 2022-11-03 DIAGNOSIS — M545 Low back pain, unspecified: Secondary | ICD-10-CM | POA: Diagnosis not present

## 2022-11-03 DIAGNOSIS — T1490XA Injury, unspecified, initial encounter: Secondary | ICD-10-CM | POA: Diagnosis not present

## 2022-11-03 DIAGNOSIS — R739 Hyperglycemia, unspecified: Secondary | ICD-10-CM | POA: Diagnosis not present

## 2022-11-03 DIAGNOSIS — S32030A Wedge compression fracture of third lumbar vertebra, initial encounter for closed fracture: Secondary | ICD-10-CM | POA: Diagnosis not present

## 2022-11-03 DIAGNOSIS — S0101XA Laceration without foreign body of scalp, initial encounter: Secondary | ICD-10-CM | POA: Diagnosis not present

## 2022-11-03 DIAGNOSIS — S299XXA Unspecified injury of thorax, initial encounter: Secondary | ICD-10-CM | POA: Diagnosis not present

## 2022-11-03 DIAGNOSIS — S0990XA Unspecified injury of head, initial encounter: Secondary | ICD-10-CM | POA: Diagnosis not present

## 2022-11-03 DIAGNOSIS — S32020A Wedge compression fracture of second lumbar vertebra, initial encounter for closed fracture: Secondary | ICD-10-CM | POA: Diagnosis not present

## 2022-11-03 MED ORDER — OXYCODONE-ACETAMINOPHEN 5-325 MG PO TABS: 1.0000 | ORAL_TABLET | ORAL | 0 refills | Status: DC | PRN

## 2022-11-03 MED ORDER — LIDOCAINE-EPINEPHRINE-TETRACAINE (LET) TOPICAL GEL
3.0000 mL | Freq: Once | TOPICAL | Status: AC
  Administered 2022-11-03: 3 mL via TOPICAL
  Filled 2022-11-03: qty 3

## 2022-11-03 MED ORDER — OXYCODONE-ACETAMINOPHEN 5-325 MG PO TABS
1.0000 | ORAL_TABLET | Freq: Once | ORAL | Status: AC
  Administered 2022-11-03: 1 via ORAL
  Filled 2022-11-03: qty 1
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
## 2022-11-03 NOTE — ED Notes (Signed)
Patient Alert and oriented to baseline. Stable and ambulatory to baseline. Patient verbalized understanding of the discharge instructions.  Patient belongings were taken by the patient.   
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
## 2022-11-03 NOTE — ED Provider Notes (Signed)
..  Laceration Repair  Date/Time: 11/03/2022 3:21 PM  Performed by: Arthor Captain, PA-C Authorized by: Arthor Captain, PA-C   Consent:    Consent obtained:  Verbal   Consent given by:  Patient   Risks discussed:  Infection, pain and need for additional repair Universal protocol:    Patient identity confirmed:  Verbally with patient Laceration details:    Location:  Scalp   Scalp location:  Frontal   Length (cm):  2 Pre-procedure details:    Preparation:  Patient was prepped and draped in usual sterile fashion Exploration:    Wound exploration: wound explored through full range of motion and entire depth of wound visualized   Treatment:    Area cleansed with:  Chlorhexidine and saline   Amount of cleaning:  Standard Skin repair:    Repair method:  Tissue adhesive Approximation:    Laceration repair approximation: hair apposition, 2. Repair type:    Repair type:  Simple Post-procedure details:    Dressing:  Open (no dressing)   Procedure completion:  Tolerated well, no immediate complications     Arthor Captain, PA-C 11/03/22 1542    Tegeler, Canary Brim, MD 11/03/22 1559
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
## 2022-11-03 NOTE — ED Notes (Signed)
Patient placed on bedside commode by this tech, tolerated well

## 2022-11-05 ENCOUNTER — Other Ambulatory Visit: Payer: Self-pay | Admitting: Family Medicine

## 2022-11-05 DIAGNOSIS — F32A Depression, unspecified: Secondary | ICD-10-CM

## 2022-11-05 DIAGNOSIS — I152 Hypertension secondary to endocrine disorders: Secondary | ICD-10-CM

## 2022-11-05 DIAGNOSIS — Z794 Long term (current) use of insulin: Secondary | ICD-10-CM

## 2022-11-05 DIAGNOSIS — E1169 Type 2 diabetes mellitus with other specified complication: Secondary | ICD-10-CM

## 2022-11-16 ENCOUNTER — Encounter (HOSPITAL_COMMUNITY): Payer: Self-pay | Admitting: *Deleted

## 2022-11-16 ENCOUNTER — Ambulatory Visit (INDEPENDENT_AMBULATORY_CARE_PROVIDER_SITE_OTHER): Payer: 59

## 2022-11-16 ENCOUNTER — Ambulatory Visit (HOSPITAL_COMMUNITY)
Admission: EM | Admit: 2022-11-16 | Discharge: 2022-11-16 | Disposition: A | Discharge status: Home / Self Care | Payer: 59 | Attending: Family Medicine | Admitting: Family Medicine

## 2022-11-16 DIAGNOSIS — M79604 Pain in right leg: Secondary | ICD-10-CM | POA: Diagnosis not present

## 2022-11-16 DIAGNOSIS — J9811 Atelectasis: Secondary | ICD-10-CM | POA: Diagnosis not present

## 2022-11-16 DIAGNOSIS — M25551 Pain in right hip: Secondary | ICD-10-CM

## 2022-11-16 DIAGNOSIS — R079 Chest pain, unspecified: Secondary | ICD-10-CM | POA: Diagnosis not present

## 2022-11-16 DIAGNOSIS — M25512 Pain in left shoulder: Secondary | ICD-10-CM | POA: Diagnosis not present

## 2022-11-16 DIAGNOSIS — I10 Essential (primary) hypertension: Secondary | ICD-10-CM | POA: Diagnosis not present

## 2022-11-16 DIAGNOSIS — Z041 Encounter for examination and observation following transport accident: Secondary | ICD-10-CM

## 2022-11-16 DIAGNOSIS — S32009A Unspecified fracture of unspecified lumbar vertebra, initial encounter for closed fracture: Secondary | ICD-10-CM

## 2022-11-16 MED ORDER — OXYCODONE-ACETAMINOPHEN 5-325 MG PO TABS: 1.0000 | ORAL_TABLET | Freq: Four times a day (QID) | ORAL | 0 refills | Status: DC | PRN
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
## 2022-11-16 NOTE — Discharge Instructions (Addendum)
Use the incentive spirometer as the nurse showed you. This will ensure your lungs stay open to avoid getting pneumonia.  Your blood pressure was noted to be elevated during your visit today. If you are currently taking medication for high blood pressure, please ensure you are taking this as directed. If you do not have a history of high blood pressure and your blood pressure remains persistently elevated, you may need to begin taking a medication at some point. You may return here within the next few days to recheck if unable to see your primary care provider or if you do not have a one.  BP (!) 181/85 (BP Location: Right Arm)   Pulse 71   Temp 98.1 F (36.7 C) (Oral)   Resp 18   SpO2 95%   BP Readings from Last 3 Encounters:  11/16/22 (!) 181/85  11/03/22 (!) 172/84  07/19/22 103/60

## 2022-11-21 ENCOUNTER — Encounter (HOSPITAL_COMMUNITY): Payer: Self-pay

## 2022-11-21 ENCOUNTER — Other Ambulatory Visit: Payer: Self-pay

## 2022-11-21 ENCOUNTER — Emergency Department (HOSPITAL_COMMUNITY)
Admission: EM | Admit: 2022-11-21 | Discharge: 2022-11-21 | Disposition: A | Discharge status: Home / Self Care | Payer: 59 | Attending: Emergency Medicine | Admitting: Emergency Medicine

## 2022-11-21 ENCOUNTER — Ambulatory Visit (HOSPITAL_COMMUNITY)
Admission: EM | Admit: 2022-11-21 | Discharge: 2022-11-21 | Disposition: A | Discharge status: Home / Self Care | Payer: 59

## 2022-11-21 DIAGNOSIS — T24231A Burn of second degree of right lower leg, initial encounter: Secondary | ICD-10-CM | POA: Diagnosis not present

## 2022-11-21 DIAGNOSIS — E119 Type 2 diabetes mellitus without complications: Secondary | ICD-10-CM | POA: Diagnosis not present

## 2022-11-21 DIAGNOSIS — Z7984 Long term (current) use of oral hypoglycemic drugs: Secondary | ICD-10-CM | POA: Diagnosis not present

## 2022-11-21 DIAGNOSIS — Z79899 Other long term (current) drug therapy: Secondary | ICD-10-CM | POA: Diagnosis not present

## 2022-11-21 DIAGNOSIS — Z794 Long term (current) use of insulin: Secondary | ICD-10-CM | POA: Diagnosis not present

## 2022-11-21 DIAGNOSIS — I10 Essential (primary) hypertension: Secondary | ICD-10-CM | POA: Insufficient documentation

## 2022-11-21 DIAGNOSIS — Z7982 Long term (current) use of aspirin: Secondary | ICD-10-CM | POA: Insufficient documentation

## 2022-11-21 DIAGNOSIS — I2510 Atherosclerotic heart disease of native coronary artery without angina pectoris: Secondary | ICD-10-CM | POA: Insufficient documentation

## 2022-11-21 DIAGNOSIS — X102XXA Contact with fats and cooking oils, initial encounter: Secondary | ICD-10-CM | POA: Diagnosis not present

## 2022-11-21 DIAGNOSIS — F17210 Nicotine dependence, cigarettes, uncomplicated: Secondary | ICD-10-CM | POA: Insufficient documentation

## 2022-11-21 DIAGNOSIS — T310 Burns involving less than 10% of body surface: Secondary | ICD-10-CM | POA: Diagnosis not present

## 2022-11-21 MED ORDER — SILVER SULFADIAZINE 1 % EX CREA: 1.0000 | TOPICAL_CREAM | Freq: Every day | CUTANEOUS | 0 refills | Status: DC
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
## 2022-11-23 ENCOUNTER — Ambulatory Visit: Payer: 59 | Attending: Family Medicine | Admitting: Family Medicine

## 2022-11-23 ENCOUNTER — Encounter: Payer: Self-pay | Admitting: Family Medicine

## 2022-11-23 ENCOUNTER — Encounter: Payer: 59 | Admitting: Family Medicine

## 2022-11-23 ENCOUNTER — Ambulatory Visit: Payer: Self-pay

## 2022-11-23 VITALS — BP 144/74 | HR 67 | Temp 98.2°F | Ht 63.0 in | Wt 178.0 lb

## 2022-11-23 DIAGNOSIS — Z794 Long term (current) use of insulin: Secondary | ICD-10-CM

## 2022-11-23 DIAGNOSIS — E1159 Type 2 diabetes mellitus with other circulatory complications: Secondary | ICD-10-CM | POA: Diagnosis not present

## 2022-11-23 DIAGNOSIS — I2510 Atherosclerotic heart disease of native coronary artery without angina pectoris: Secondary | ICD-10-CM

## 2022-11-23 DIAGNOSIS — E785 Hyperlipidemia, unspecified: Secondary | ICD-10-CM | POA: Diagnosis not present

## 2022-11-23 DIAGNOSIS — I152 Hypertension secondary to endocrine disorders: Secondary | ICD-10-CM | POA: Diagnosis not present

## 2022-11-23 DIAGNOSIS — E1169 Type 2 diabetes mellitus with other specified complication: Secondary | ICD-10-CM | POA: Diagnosis not present

## 2022-11-23 DIAGNOSIS — Z7984 Long term (current) use of oral hypoglycemic drugs: Secondary | ICD-10-CM

## 2022-11-23 DIAGNOSIS — T24011D Burn of unspecified degree of right thigh, subsequent encounter: Secondary | ICD-10-CM | POA: Diagnosis not present

## 2022-11-23 LAB — POCT GLYCOSYLATED HEMOGLOBIN (HGB A1C): HbA1c, POC (controlled diabetic range): 9.5 % — AB (ref 0.0–7.0)

## 2022-11-23 MED ORDER — LANTUS SOLOSTAR 100 UNIT/ML ~~LOC~~ SOPN: 55.0000 [IU] | PEN_INJECTOR | Freq: Every day | SUBCUTANEOUS | 6 refills | Status: DC

## 2022-11-23 MED ORDER — CEPHALEXIN 500 MG PO CAPS
500.0000 mg | ORAL_CAPSULE | Freq: Two times a day (BID) | ORAL | 0 refills | Status: DC
Start: 2022-11-23 — End: 2022-12-23

## 2022-11-23 NOTE — Progress Notes (Signed)
Patient in office today to see PCP. Old dressing removed  from right leg per PCP.  Wound bed is pink to red , margins of the wound is Sarah Charles to tan with yellow drainage noted. Xeroform placed on wounds to right leg abd pad placed over Xeroform and secured with Roll gauze, tape and Ace wrap. Patient denies dressing being to tight. Voices the the wound feels much better. Patient advised that if she develops a temperature of 100 or greater ,chills loss of appetite , dizziness or weakness to got the the ED. Patient given AVS and aware of follow-up appointments and of medications that have been sent to her pharmacy. Transportion arranged for patient to get home for OV.

## 2022-11-23 NOTE — Progress Notes (Unsigned)
Burn on right
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
## 2022-11-23 NOTE — Patient Instructions (Signed)
Burn Care, Adult A burn is an injury to the skin or the tissues under the skin. There are three types of burns: First degree. These burns may cause the skin to be red and a bit swollen. Second degree. These burns are very painful. They can make the skin very red, swell, leak fluid, look shiny, and start to have blisters. Third degree. These burns cause lasting damage. They turn the skin white or black and make it look charred, dry, and leathery. Treatment will depend on the type of burn you have. Taking good care of your burn can help you get better faster. How to care for a first-degree burn Right after the burn: Rinse or soak the burn under cool water for 5 minutes or more. Put a cool, wet cloth on your burn. Do not put ice on your burn. Caring for the burn Your doctor may tell you to: Clean the burn using soap and water. Pat the burn dry using a clean cloth. Do not rub or scrub the burn. Put lotion or aloe vera gel on your burn. How to care for a second-degree burn Right after the burn: Rinse or soak the burn under cool water. Do this for 5-10 minutes. Do not put ice on your burn. Take off any jewelry or clothes near the burn. Cover the burn with a clean cloth. Caring for the burn Your doctor may tell you to: Clean or rinse your burn. Put a cream or ointment on the burn. Put a germ-free (sterile) dressing over the burn. A dressing is a bandage that can help a burn heal. Raise (elevate) the burned area above the level of your heart while you are sitting or lying down. How to care for a third-degree burn Right after the burn: Cover the burn with a clean, dry cloth. Get help right away. You may need to: Stay in the hospital. Have surgery to remove burned tissue. Have surgery to put new skin on the burned area. Get fluids through an IV tube. Caring for the burn Your doctor may tell you to: Clean or rinse out your burn. Put a cream or ointment on the burn. Put a germ-free  bandage in the burn. This is called packing. Put a germ-free bandage over the burn. Raise the burned area above the level of your heart while you are sitting or lying down. Wear splints or immobilizers. Rest. Do not do sports or other activities until your doctor says that you can. How to prevent infection when caring for a burn  Wash your hands with soap and water for at least 20 seconds before and after you take care of your burn. If you cannot use soap and water, use hand sanitizer. Wear clean gloves as told by your doctor. Do not put butter, oil, toothpaste, or other home remedies on the burn. Do not scratch or pick at the burn. Do not break any blisters. Do not peel the skin. Do not rub your burn, even when cleaning it. Check your burn every day for signs of infection. Check for: More redness, swelling, or pain. Warmth. Pus or a bad smell. Red streaks around the burn. Follow these instructions at home Medicines Take over-the-counter and prescription medicines only as told by your doctor. If you were prescribed antibiotics, use them as told by your doctor. Do not stop using them even if you start to feel better. General instructions Do not smoke or use any products that contain nicotine or tobacco. If you need help   quitting, ask your doctor. Drink enough fluid to keep your pee (urine) pale yellow. Protect your burn from the sun. Contact a doctor if: Your burn does not get better. Your burn gets worse. You have any signs of infection. Your burn looks different or gets black or red spots. Your pain does not get better with medicine. You feel worried, nervous, or sad (depressed). Get help right away if: You have red streaks near the burn. You have very bad pain. This information is not intended to replace advice given to you by your health care provider. Make sure you discuss any questions you have with your health care provider. Document Revised: 05/27/2022 Document Reviewed:  05/27/2022 Elsevier Patient Education  2024 [employer]  
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
## 2022-11-29 ENCOUNTER — Telehealth: Payer: Self-pay | Admitting: Family Medicine

## 2022-11-29 DIAGNOSIS — T3111 Burns involving 10-19% of body surface with 10-19% third degree burns: Secondary | ICD-10-CM | POA: Diagnosis not present

## 2022-11-29 DIAGNOSIS — G8911 Acute pain due to trauma: Secondary | ICD-10-CM | POA: Diagnosis not present

## 2022-11-29 DIAGNOSIS — M792 Neuralgia and neuritis, unspecified: Secondary | ICD-10-CM | POA: Diagnosis not present

## 2022-11-29 NOTE — Telephone Encounter (Signed)
Sarah Charles with Sarah Charles Charles is calling in to check on the status of paperwork she faxed over. Sarah Charles Charles would like a call back regarding if the paperwork has been completed or an estimation on when she can expect it back.

## 2022-11-30 DIAGNOSIS — T3111 Burns involving 10-19% of body surface with 10-19% third degree burns: Secondary | ICD-10-CM | POA: Diagnosis not present

## 2022-12-01 ENCOUNTER — Other Ambulatory Visit: Payer: Self-pay | Admitting: Family Medicine

## 2022-12-01 ENCOUNTER — Telehealth: Payer: Self-pay | Admitting: Family Medicine

## 2022-12-01 NOTE — Telephone Encounter (Signed)
Spoke with patient . Verified name & DOB     Patient called to ask if we had call her asking  her for information to complete a form needed of health reason. Advised patient that no one here would call her to obtain information for any forms to be completed as we have all the information needed to complete any forms . Advised patient not to give anyone her personal information over the phone because it mostly likely someone trying to take advantage of her. Advised that if she in unsure of who calling just hung and call them back to see if they answer, but never give them bank information or SS#. Advised that if she had any more concern to call us back.

## 2022-12-01 NOTE — Telephone Encounter (Signed)
Patient called to f/u on paperwork. She is requesting a call back. Please reference TE from 7/8.

## 2022-12-01 NOTE — Telephone Encounter (Signed)
Patient called back requesting a callback from Dr. Baxter Flattery nurse. Please advise.

## 2022-12-01 NOTE — Telephone Encounter (Signed)
Medication Refill - Medication: oxyCODONE-acetaminophen (PERCOCET/ROXICET) 5-325 MG tablet   Has the patient contacted their pharmacy? No patient called directly in (Agent: If no, request that the patient contact the pharmacy for the refill. If patient does not wish to contact the pharmacy document the reason why and proceed with request.) (Agent: If yes, when and what did the pharmacy advise?)pt called directly in, snice this is controlled substance  Preferred Pharmacy (with phone number or street name):  [facility] - [place], [place] - [address] Phone: [phone]  Fax: [phone]     Has the patient been seen for an appointment in the last year OR does the patient have an upcoming appointment? yes  Agent: Please be advised that RX refills may take up to 3 business days. We ask that you follow-up with your pharmacy.

## 2022-12-02 ENCOUNTER — Telehealth: Payer: Self-pay | Admitting: Family Medicine

## 2022-12-02 NOTE — Telephone Encounter (Signed)
Pt returned phone call and she states that she has not requested this test so paperwork has been shredded.

## 2022-12-02 NOTE — Telephone Encounter (Signed)
LVM for patient to return phone call regarding this information.

## 2022-12-02 NOTE — Telephone Encounter (Signed)
Requested medication (s) are due for refill today: routing for review  Requested medication (s) are on the active medication list: yes  Last refill:  11/16/22  Future visit scheduled: no  Notes to clinic:  Unable to refill per protocol, cannot delegate.      Requested Prescriptions  Pending Prescriptions Disp Refills   oxyCODONE-acetaminophen (PERCOCET/ROXICET) 5-325 MG tablet 20 tablet 0    Sig: Take 1 tablet by mouth every 6 (six) hours as needed for severe pain.     Not Delegated - Analgesics:  Opioid Agonist Combinations Failed - 12/01/2022  1:37 PM      Failed - This refill cannot be delegated      Failed - Urine Drug Screen completed in last 360 days      Passed - Valid encounter within last 3 months    Recent Outpatient Visits           1 week ago Type 2 diabetes mellitus with other specified complication, with long-term current use of insulin (HCC)   [facility] [facility] [name], MD   5 months ago Acute cough   [facility] [name], MD   5 months ago Vaginal candidiasis   [place] [facility] [employer], Odette Horns, MD   9 months ago Type 2 diabetes mellitus with other specified complication, with long-term current use of insulin [facility])   [place] [place], Odette Horns, MD   1 year ago Type 2 diabetes mellitus with other specified complication, with long-term current use of insulin [facility])   [facility] [name], MD

## 2022-12-02 NOTE — Telephone Encounter (Signed)
Copied from CRM [id number]. Topic: General - Other >> Dec 01, 2022  3:32 PM Franchot Heidelberg wrote: Reason for CRM: Maralyn Sago calling from [employer] called about recent fax submissions for lab requisitions. She wants an update   Best contact: [phone]   UTI test

## 2022-12-08 ENCOUNTER — Other Ambulatory Visit: Payer: Self-pay | Admitting: Family Medicine

## 2022-12-08 DIAGNOSIS — Z794 Long term (current) use of insulin: Secondary | ICD-10-CM

## 2022-12-08 DIAGNOSIS — E1169 Type 2 diabetes mellitus with other specified complication: Secondary | ICD-10-CM

## 2022-12-08 DIAGNOSIS — I152 Hypertension secondary to endocrine disorders: Secondary | ICD-10-CM

## 2022-12-08 DIAGNOSIS — F32A Depression, unspecified: Secondary | ICD-10-CM

## 2022-12-08 NOTE — Telephone Encounter (Signed)
Requested Prescriptions  Pending Prescriptions Disp Refills   FLUoxetine (PROZAC) 20 MG capsule [Pharmacy Med Name: fluoxetine 20 mg capsule] 90 capsule 0    Sig: TAKE ONE CAPSULE BY MOUTH EVERY MORNING     Psychiatry:  Antidepressants - SSRI Passed - 12/08/2022 10:21 AM      Passed - Completed PHQ-2 or PHQ-9 in the last 360 days      Passed - Valid encounter within last 6 months    Recent Outpatient Visits           2 weeks ago Type 2 diabetes mellitus with other specified complication, with long-term current use of insulin (HCC)   [facility] [place], [place], MD   5 months ago Acute cough   [facility] [place], Odette Horns, MD   6 months ago Vaginal candidiasis   [place] [facility] [place], Odette Horns, MD   9 months ago Type 2 diabetes mellitus with other specified complication, with long-term current use of insulin (HCC)   [facility] [place], Odette Horns, MD   1 year ago Type 2 diabetes mellitus with other specified complication, with long-term current use of insulin (HCC)    [facility] [place], [place], MD               fenofibrate (TRICOR) 145 MG tablet [Pharmacy Med Name: fenofibrate nanocrystallized 145 mg tablet] 90 tablet 0    Sig: TAKE ONE TABLET BY MOUTH EVERY EVENING     Cardiovascular:  Antilipid - Fibric Acid Derivatives Failed - 12/08/2022 10:21 AM      Failed - Lipid Panel in normal range within the last 12 months    Cholesterol, Total  Date Value Ref Range Status  11/12/2021 137 100 - 199 mg/dL Final   LDL Chol Calc (NIH)  Date Value Ref Range Status  11/12/2021 64 0 - 99 mg/dL Final   HDL  Date Value Ref Range Status  11/12/2021 54 >39 mg/dL Final   Triglycerides  Date Value Ref Range Status  11/12/2021 102 0 - 149 mg/dL Final         Passed - ALT in normal range and within 360 days    ALT   Date Value Ref Range Status  04/24/2022 14 0 - 44 U/L Final         Passed - AST in normal range and within 360 days    AST  Date Value Ref Range Status  04/24/2022 16 15 - 41 U/L Final         Passed - Cr in normal range and within 360 days    Creat  Date Value Ref Range Status  07/23/2016 0.50 0.50 - 1.05 mg/dL Final    Comment:      For patients > or = 62 years of age: The upper reference limit for Creatinine is approximately 13% higher for people identified as African-American.      Creatinine, Ser  Date Value Ref Range Status  06/14/2022 0.89 0.44 - 1.00 mg/dL Final   Creatinine, POC  Date Value Ref Range Status  09/16/2016 100 mg/dL Final   Creatinine, Urine  Date Value Ref Range Status  07/23/2016 47 20 - 320 mg/dL Final         Passed - HGB in normal range and within 360 days    Hemoglobin  Date Value Ref Range Status  06/15/2022 12.9 12.0 -  15.0 g/dL Final  16/02/9603 54.0 11.1 - 15.9 g/dL Final         Passed - HCT in normal range and within 360 days    HCT  Date Value Ref Range Status  06/15/2022 38.0 36.0 - 46.0 % Final   Hematocrit  Date Value Ref Range Status  11/09/2018 42.8 34.0 - 46.6 % Final         Passed - PLT in normal range and within 360 days    Platelets  Date Value Ref Range Status  06/14/2022 211 150 - 400 K/uL Final  11/09/2018 321 150 - 450 x10E3/uL Final         Passed - WBC in normal range and within 360 days    WBC  Date Value Ref Range Status  06/14/2022 6.6 4.0 - 10.5 K/uL Final         Passed - eGFR is 30 or above and within 360 days    GFR, Est African American  Date Value Ref Range Status  07/23/2016 >89 >=60 mL/min Final   GFR calc Af Amer  Date Value Ref Range Status  12/10/2019 >60 >60 mL/min Final   GFR, Est Non African American  Date Value Ref Range Status  07/23/2016 >89 >=60 mL/min Final   GFR, Estimated  Date Value Ref Range Status  06/14/2022 >60 >60 mL/min Final    Comment:     (NOTE) Calculated using the CKD-EPI Creatinine Equation (2021)    eGFR  Date Value Ref Range Status  02/25/2022 97 >59 mL/min/1.73 Final         Passed - Valid encounter within last 12 months    Recent Outpatient Visits           2 weeks ago Type 2 diabetes mellitus with other specified complication, with long-term current use of insulin (HCC)   [facility] [place], Odette Horns, MD   5 months ago Acute cough   [place] [facility] [name], MD   6 months ago Vaginal candidiasis   [facility] [place], [place], MD   9 months ago Type 2 diabetes mellitus with other specified complication, with long-term current use of insulin (HCC)   [place] [facility] [place], Odette Horns, MD   1 year ago Type 2 diabetes mellitus with other specified complication, with long-term current use of insulin (HCC)    [facility] [place], [place], MD               metoprolol succinate (TOPROL-XL) 25 MG 24 hr tablet [Pharmacy Med Name: metoprolol succinate ER 25 mg tablet,extended release 24 hr] 90 tablet 0    Sig: TAKE ONE TABLET BY MOUTH EVERY MORNING WITH OR immediately following A meal     Cardiovascular:  Beta Blockers Failed - 12/08/2022 10:21 AM      Failed - Last BP in normal range    BP Readings from Last 1 Encounters:  11/23/22 (!) 144/74         Passed - Last Heart Rate in normal range    Pulse Readings from Last 1 Encounters:  11/23/22 67         Passed - Valid encounter within last 6 months    Recent Outpatient Visits           2 weeks ago Type 2 diabetes mellitus with other specified complication, with long-term current use of insulin (  HCC)   [place] [facility] [place], [place], MD   5 months ago Acute cough   [place] [facility] [place], [place], MD   6 months ago Vaginal  candidiasis   [place] [facility] [name], MD   9 months ago Type 2 diabetes mellitus with other specified complication, with long-term current use of insulin (HCC)   [place] [facility] [place], Odette Horns, MD   1 year ago Type 2 diabetes mellitus with other specified complication, with long-term current use of insulin (HCC)   [place] [facility] [facility] [place], Odette Horns, MD               metFORMIN (GLUCOPHAGE) 500 MG tablet [Pharmacy Med Name: metformin 500 mg tablet] 360 tablet 0    Sig: TAKE TWO TABLETS BY MOUTH TWICE DAILY WITH A MEAL     Endocrinology:  Diabetes - Biguanides Failed - 12/08/2022 10:21 AM      Failed - HBA1C is between 0 and 7.9 and within 180 days    HbA1c, POC (controlled diabetic range)  Date Value Ref Range Status  11/23/2022 9.5 (A) 0.0 - 7.0 % Final         Failed - B12 Level in normal range and within 720 days    Vitamin B-12  Date Value Ref Range Status  03/23/2018 827 232 - 1,245 pg/mL Final         Failed - CBC within normal limits and completed in the last 12 months    WBC  Date Value Ref Range Status  06/14/2022 6.6 4.0 - 10.5 K/uL Final   RBC  Date Value Ref Range Status  06/14/2022 4.52 3.87 - 5.11 MIL/uL Final   Hemoglobin  Date Value Ref Range Status  06/15/2022 12.9 12.0 - 15.0 g/dL Final  41/32/4401 02.7 11.1 - 15.9 g/dL Final   HCT  Date Value Ref Range Status  06/15/2022 38.0 36.0 - 46.0 % Final   Hematocrit  Date Value Ref Range Status  11/09/2018 42.8 34.0 - 46.6 % Final   MCHC  Date Value Ref Range Status  06/14/2022 34.7 30.0 - 36.0 g/dL Final   [facility]  Date Value Ref Range Status  06/14/2022 30.3 26.0 - 34.0 pg Final   MCV  Date Value Ref Range Status  06/14/2022 87.4 80.0 - 100.0 fL Final  11/09/2018 93 79 - 97 fL Final   No results found for: "PLTCOUNTKUC", "LABPLAT", "POCPLA" RDW  Date Value Ref Range Status  06/14/2022 12.6 11.5  - 15.5 % Final  11/09/2018 12.5 11.7 - 15.4 % Final         Passed - Cr in normal range and within 360 days    Creat  Date Value Ref Range Status  07/23/2016 0.50 0.50 - 1.05 mg/dL Final    Comment:      For patients > or = 62 years of age: The upper reference limit for Creatinine is approximately 13% higher for people identified as African-American.      Creatinine, Ser  Date Value Ref Range Status  06/14/2022 0.89 0.44 - 1.00 mg/dL Final   Creatinine, POC  Date Value Ref Range Status  09/16/2016 100 mg/dL Final   Creatinine, Urine  Date Value Ref Range Status  07/23/2016 47 20 - 320 mg/dL Final         Passed - eGFR in normal range and within 360 days  GFR, Est African American  Date Value Ref Range Status  07/23/2016 >89 >=60 mL/min Final   GFR calc Af Amer  Date Value Ref Range Status  12/10/2019 >60 >60 mL/min Final   GFR, Est Non African American  Date Value Ref Range Status  07/23/2016 >89 >=60 mL/min Final   GFR, Estimated  Date Value Ref Range Status  06/14/2022 >60 >60 mL/min Final    Comment:    (NOTE) Calculated using the CKD-EPI Creatinine Equation (2021)    eGFR  Date Value Ref Range Status  02/25/2022 97 >59 mL/min/1.73 Final         Passed - Valid encounter within last 6 months    Recent Outpatient Visits           2 weeks ago Type 2 diabetes mellitus with other specified complication, with long-term current use of insulin (HCC)   [facility] [place], Odette Horns, MD   5 months ago Acute cough   [place] [facility] [name], MD   6 months ago Vaginal candidiasis   [place] [facility] [place], [place], MD   9 months ago Type 2 diabetes mellitus with other specified complication, with long-term current use of insulin (HCC)   [place] [facility] [place], [place], MD   1 year ago Type 2 diabetes mellitus with other  specified complication, with long-term current use of insulin (HCC)   [place] [facility] [place], [place], MD               traZODone (DESYREL) 50 MG tablet [Pharmacy Med Name: trazodone 50 mg tablet] 90 tablet 0    Sig: TAKE ONE TABLET BY MOUTH EVERYDAY AT BEDTIME     Psychiatry: Antidepressants - Serotonin Modulator Passed - 12/08/2022 10:21 AM      Passed - Completed PHQ-2 or PHQ-9 in the last 360 days      Passed - Valid encounter within last 6 months    Recent Outpatient Visits           2 weeks ago Type 2 diabetes mellitus with other specified complication, with long-term current use of insulin (HCC)   [facility] [place], Odette Horns, MD   5 months ago Acute cough   [place] [facility] [name], MD   6 months ago Vaginal candidiasis   [place] [place], [place], MD   9 months ago Type 2 diabetes mellitus with other specified complication, with long-term current use of insulin (HCC)   [place] [facility] [place], [place], MD   1 year ago Type 2 diabetes mellitus with other specified complication, with long-term current use of insulin (HCC)   [place] [facility] [place], [place], MD               buPROPion [facility] SR) 150 MG 12 hr tablet [Pharmacy Med Name: bupropion HCl SR 150 mg tablet,12 hr sustained-release] 180 tablet 0    Sig: TAKE ONE TABLET BY MOUTH TWICE DAILY     Psychiatry: Antidepressants - bupropion Failed - 12/08/2022 10:21 AM      Failed - Last BP in normal range    BP Readings from Last 1 Encounters:  11/23/22 (!) 144/74         Passed - Cr in normal range and within 360 days  Creat  Date Value Ref Range Status  07/23/2016 0.50 0.50 - 1.05 mg/dL Final    Comment:      For patients > or = 62 years of age: The upper reference limit for Creatinine is approximately  13% higher for people identified as African-American.      Creatinine, Ser  Date Value Ref Range Status  06/14/2022 0.89 0.44 - 1.00 mg/dL Final   Creatinine, POC  Date Value Ref Range Status  09/16/2016 100 mg/dL Final   Creatinine, Urine  Date Value Ref Range Status  07/23/2016 47 20 - 320 mg/dL Final         Passed - AST in normal range and within 360 days    AST  Date Value Ref Range Status  04/24/2022 16 15 - 41 U/L Final         Passed - ALT in normal range and within 360 days    ALT  Date Value Ref Range Status  04/24/2022 14 0 - 44 U/L Final         Passed - Completed PHQ-2 or PHQ-9 in the last 360 days      Passed - Valid encounter within last 6 months    Recent Outpatient Visits           2 weeks ago Type 2 diabetes mellitus with other specified complication, with long-term current use of insulin (HCC)   [facility] [place], Odette Horns, MD   5 months ago Acute cough   [place] [facility] [name], MD   6 months ago Vaginal candidiasis   [place] [facility] [place], [place], MD   9 months ago Type 2 diabetes mellitus with other specified complication, with long-term current use of insulin (HCC)   [place] [facility] [place], [place], MD   1 year ago Type 2 diabetes mellitus with other specified complication, with long-term current use of insulin (HCC)   [facility] [place], [place], MD               lisinopril (ZESTRIL) 40 MG tablet [Pharmacy Med Name: lisinopril 40 mg tablet] 90 tablet 0    Sig: TAKE ONE TABLET BY MOUTH ONCE DAILY     Cardiovascular:  ACE Inhibitors Failed - 12/08/2022 10:21 AM      Failed - Last BP in normal range    BP Readings from Last 1 Encounters:  11/23/22 (!) 144/74         Passed - Cr in normal range and within 180 days    Creat  Date Value Ref Range Status  07/23/2016  0.50 0.50 - 1.05 mg/dL Final    Comment:      For patients > or = 62 years of age: The upper reference limit for Creatinine is approximately 13% higher for people identified as African-American.      Creatinine, Ser  Date Value Ref Range Status  06/14/2022 0.89 0.44 - 1.00 mg/dL Final   Creatinine, POC  Date Value Ref Range Status  09/16/2016 100 mg/dL Final   Creatinine, Urine  Date Value Ref Range Status  07/23/2016 47 20 - 320 mg/dL Final         Passed - K in normal range and within 180 days    Potassium  Date Value Ref Range Status  06/15/2022 3.6 3.5 - 5.1 mmol/L Final  Passed - Patient is not pregnant      Passed - Valid encounter within last 6 months    Recent Outpatient Visits           2 weeks ago Type 2 diabetes mellitus with other specified complication, with long-term current use of insulin (HCC)   [place] [facility] [name], MD   5 months ago Acute cough   [place] [facility] [name], MD   6 months ago Vaginal candidiasis   [place] [facility] [place], Odette Horns, MD   9 months ago Type 2 diabetes mellitus with other specified complication, with long-term current use of insulin [facility])   [place] [facility] [place], Odette Horns, MD   1 year ago Type 2 diabetes mellitus with other specified complication, with long-term current use of insulin [facility])   [place] [facility] [name], MD

## 2022-12-13 DIAGNOSIS — T3111 Burns involving 10-19% of body surface with 10-19% third degree burns: Secondary | ICD-10-CM | POA: Diagnosis not present

## 2022-12-13 DIAGNOSIS — M792 Neuralgia and neuritis, unspecified: Secondary | ICD-10-CM | POA: Diagnosis not present

## 2022-12-13 DIAGNOSIS — G8911 Acute pain due to trauma: Secondary | ICD-10-CM | POA: Diagnosis not present

## 2022-12-17 ENCOUNTER — Other Ambulatory Visit: Payer: Self-pay

## 2022-12-17 ENCOUNTER — Emergency Department (HOSPITAL_COMMUNITY): Payer: 59

## 2022-12-17 ENCOUNTER — Inpatient Hospital Stay (HOSPITAL_COMMUNITY)
Admission: EM | Admit: 2022-12-17 | Discharge: 2022-12-23 | DRG: 871 | Disposition: A | Discharge status: Home / Self Care | Payer: 59 | Attending: Internal Medicine | Admitting: Internal Medicine

## 2022-12-17 ENCOUNTER — Encounter (HOSPITAL_COMMUNITY): Payer: Self-pay

## 2022-12-17 DIAGNOSIS — M1990 Unspecified osteoarthritis, unspecified site: Secondary | ICD-10-CM | POA: Diagnosis present

## 2022-12-17 DIAGNOSIS — Z7982 Long term (current) use of aspirin: Secondary | ICD-10-CM

## 2022-12-17 DIAGNOSIS — I2510 Atherosclerotic heart disease of native coronary artery without angina pectoris: Secondary | ICD-10-CM | POA: Diagnosis not present

## 2022-12-17 DIAGNOSIS — I10 Essential (primary) hypertension: Secondary | ICD-10-CM | POA: Diagnosis not present

## 2022-12-17 DIAGNOSIS — Z8249 Family history of ischemic heart disease and other diseases of the circulatory system: Secondary | ICD-10-CM

## 2022-12-17 DIAGNOSIS — Z794 Long term (current) use of insulin: Secondary | ICD-10-CM | POA: Diagnosis not present

## 2022-12-17 DIAGNOSIS — F32A Depression, unspecified: Secondary | ICD-10-CM | POA: Diagnosis present

## 2022-12-17 DIAGNOSIS — R6889 Other general symptoms and signs: Secondary | ICD-10-CM | POA: Diagnosis not present

## 2022-12-17 DIAGNOSIS — K219 Gastro-esophageal reflux disease without esophagitis: Secondary | ICD-10-CM | POA: Diagnosis present

## 2022-12-17 DIAGNOSIS — Z825 Family history of asthma and other chronic lower respiratory diseases: Secondary | ICD-10-CM

## 2022-12-17 DIAGNOSIS — G9341 Metabolic encephalopathy: Secondary | ICD-10-CM | POA: Diagnosis not present

## 2022-12-17 DIAGNOSIS — I160 Hypertensive urgency: Secondary | ICD-10-CM

## 2022-12-17 DIAGNOSIS — Z955 Presence of coronary angioplasty implant and graft: Secondary | ICD-10-CM | POA: Diagnosis not present

## 2022-12-17 DIAGNOSIS — A419 Sepsis, unspecified organism: Principal | ICD-10-CM | POA: Diagnosis present

## 2022-12-17 DIAGNOSIS — Z79899 Other long term (current) drug therapy: Secondary | ICD-10-CM

## 2022-12-17 DIAGNOSIS — R112 Nausea with vomiting, unspecified: Secondary | ICD-10-CM

## 2022-12-17 DIAGNOSIS — R630 Anorexia: Secondary | ICD-10-CM | POA: Diagnosis present

## 2022-12-17 DIAGNOSIS — E1110 Type 2 diabetes mellitus with ketoacidosis without coma: Secondary | ICD-10-CM | POA: Diagnosis not present

## 2022-12-17 DIAGNOSIS — L03115 Cellulitis of right lower limb: Secondary | ICD-10-CM | POA: Diagnosis not present

## 2022-12-17 DIAGNOSIS — K2210 Ulcer of esophagus without bleeding: Secondary | ICD-10-CM | POA: Diagnosis not present

## 2022-12-17 DIAGNOSIS — Z743 Need for continuous supervision: Secondary | ICD-10-CM | POA: Diagnosis not present

## 2022-12-17 DIAGNOSIS — Z801 Family history of malignant neoplasm of trachea, bronchus and lung: Secondary | ICD-10-CM

## 2022-12-17 DIAGNOSIS — R000 Tachycardia, unspecified: Secondary | ICD-10-CM | POA: Diagnosis not present

## 2022-12-17 DIAGNOSIS — E119 Type 2 diabetes mellitus without complications: Secondary | ICD-10-CM

## 2022-12-17 DIAGNOSIS — Z888 Allergy status to other drugs, medicaments and biological substances status: Secondary | ICD-10-CM

## 2022-12-17 DIAGNOSIS — E785 Hyperlipidemia, unspecified: Secondary | ICD-10-CM | POA: Diagnosis present

## 2022-12-17 DIAGNOSIS — K3189 Other diseases of stomach and duodenum: Secondary | ICD-10-CM | POA: Diagnosis present

## 2022-12-17 DIAGNOSIS — L0390 Cellulitis, unspecified: Secondary | ICD-10-CM

## 2022-12-17 DIAGNOSIS — R531 Weakness: Secondary | ICD-10-CM | POA: Diagnosis not present

## 2022-12-17 DIAGNOSIS — E669 Obesity, unspecified: Secondary | ICD-10-CM | POA: Diagnosis present

## 2022-12-17 DIAGNOSIS — G4733 Obstructive sleep apnea (adult) (pediatric): Secondary | ICD-10-CM | POA: Diagnosis not present

## 2022-12-17 DIAGNOSIS — R0689 Other abnormalities of breathing: Secondary | ICD-10-CM | POA: Diagnosis not present

## 2022-12-17 DIAGNOSIS — Z803 Family history of malignant neoplasm of breast: Secondary | ICD-10-CM

## 2022-12-17 DIAGNOSIS — Z6831 Body mass index (BMI) 31.0-31.9, adult: Secondary | ICD-10-CM

## 2022-12-17 DIAGNOSIS — K269 Duodenal ulcer, unspecified as acute or chronic, without hemorrhage or perforation: Secondary | ICD-10-CM | POA: Diagnosis present

## 2022-12-17 DIAGNOSIS — R140 Abdominal distension (gaseous): Secondary | ICD-10-CM | POA: Diagnosis not present

## 2022-12-17 DIAGNOSIS — R739 Hyperglycemia, unspecified: Secondary | ICD-10-CM | POA: Diagnosis not present

## 2022-12-17 DIAGNOSIS — I499 Cardiac arrhythmia, unspecified: Secondary | ICD-10-CM | POA: Diagnosis not present

## 2022-12-17 DIAGNOSIS — Z833 Family history of diabetes mellitus: Secondary | ICD-10-CM

## 2022-12-17 DIAGNOSIS — Z83438 Family history of other disorder of lipoprotein metabolism and other lipidemia: Secondary | ICD-10-CM

## 2022-12-17 DIAGNOSIS — E876 Hypokalemia: Secondary | ICD-10-CM | POA: Diagnosis not present

## 2022-12-17 DIAGNOSIS — Z7984 Long term (current) use of oral hypoglycemic drugs: Secondary | ICD-10-CM

## 2022-12-17 DIAGNOSIS — Z823 Family history of stroke: Secondary | ICD-10-CM

## 2022-12-17 DIAGNOSIS — T24001D Burn of unspecified degree of unspecified site of right lower limb, except ankle and foot, subsequent encounter: Secondary | ICD-10-CM

## 2022-12-17 DIAGNOSIS — F17210 Nicotine dependence, cigarettes, uncomplicated: Secondary | ICD-10-CM | POA: Diagnosis present

## 2022-12-17 LAB — BLOOD GAS, VENOUS
Acid-base deficit: 17.1 mmol/L — ABNORMAL HIGH (ref 0.0–2.0)
Acid-base deficit: 19.5 mmol/L — ABNORMAL HIGH (ref 0.0–2.0)
Bicarbonate: 9.8 mmol/L — ABNORMAL LOW (ref 20.0–28.0)
Bicarbonate: 8.6 mmol/L — ABNORMAL LOW (ref 20.0–28.0)
O2 Saturation: 48.4 %
O2 Saturation: 56.3 %
Patient temperature: 37
Patient temperature: 37
pCO2, Ven: 27 mmHg — ABNORMAL LOW (ref 44–60)
pCO2, Ven: 27 mmHg — ABNORMAL LOW (ref 44–60)
pH, Ven: 7.17 — CL (ref 7.25–7.43)
pH, Ven: 7.11 — CL (ref 7.25–7.43)
pO2, Ven: 31 mmHg — CL (ref 32–45)
pO2, Ven: 31 mmHg — CL (ref 32–45)

## 2022-12-17 LAB — COMPREHENSIVE METABOLIC PANEL
ALT: 16 U/L (ref 0–44)
AST: 13 U/L — ABNORMAL LOW (ref 15–41)
Albumin: 4.3 g/dL (ref 3.5–5.0)
Alkaline Phosphatase: 127 U/L — ABNORMAL HIGH (ref 38–126)
Anion gap: 29 — ABNORMAL HIGH (ref 5–15)
BUN: 28 mg/dL — ABNORMAL HIGH (ref 8–23)
CO2: 9 mmol/L — ABNORMAL LOW (ref 22–32)
Calcium: 9.6 mg/dL (ref 8.9–10.3)
Chloride: 96 mmol/L — ABNORMAL LOW (ref 98–111)
Creatinine, Ser: 1.04 mg/dL — ABNORMAL HIGH (ref 0.44–1.00)
GFR, Estimated: >60 mL/min (ref >60)
Glucose, Bld: 525 mg/dL (ref 70–99)
Potassium: 4.1 mmol/L (ref 3.5–5.1)
Sodium: 134 mmol/L — ABNORMAL LOW (ref 135–145)
Total Bilirubin: 1.5 mg/dL — ABNORMAL HIGH (ref 0.3–1.2)
Total Protein: 8.7 g/dL — ABNORMAL HIGH (ref 6.5–8.1)

## 2022-12-17 LAB — I-STAT CHEM 8, ED
BUN: 28 mg/dL — ABNORMAL HIGH (ref 8–23)
Calcium, Ion: 1.24 mmol/L (ref 1.15–1.40)
Chloride: 103 mmol/L (ref 98–111)
Creatinine, Ser: 0.6 mg/dL (ref 0.44–1.00)
Glucose, Bld: 509 mg/dL (ref 70–99)
HCT: 52 % — ABNORMAL HIGH (ref 36.0–46.0)
Hemoglobin: 17.7 g/dL — ABNORMAL HIGH (ref 12.0–15.0)
Potassium: 4.3 mmol/L (ref 3.5–5.1)
Sodium: 133 mmol/L — ABNORMAL LOW (ref 135–145)
TCO2: 12 mmol/L — ABNORMAL LOW (ref 22–32)

## 2022-12-17 LAB — CBC WITH DIFFERENTIAL/PLATELET
Abs Immature Granulocytes: 0.14 10*3/uL — ABNORMAL HIGH (ref 0.00–0.07)
Basophils Absolute: 0.1 10*3/uL (ref 0.0–0.1)
Basophils Relative: 1 %
Eosinophils Absolute: 0 10*3/uL (ref 0.0–0.5)
Eosinophils Relative: 0 %
HCT: 50.2 % — ABNORMAL HIGH (ref 36.0–46.0)
Hemoglobin: 15.9 g/dL — ABNORMAL HIGH (ref 12.0–15.0)
Immature Granulocytes: 1 %
Lymphocytes Relative: 6 %
Lymphs Abs: 0.8 10*3/uL (ref 0.7–4.0)
MCH: 29.5 pg (ref 26.0–34.0)
MCHC: 31.7 g/dL (ref 30.0–36.0)
MCV: 93.1 fL (ref 80.0–100.0)
Monocytes Absolute: 0.4 10*3/uL (ref 0.1–1.0)
Monocytes Relative: 3 %
Neutro Abs: 12 10*3/uL — ABNORMAL HIGH (ref 1.7–7.7)
Neutrophils Relative %: 89 %
Platelets: 366 10*3/uL (ref 150–400)
RBC: 5.39 MIL/uL — ABNORMAL HIGH (ref 3.87–5.11)
RDW: 13.3 % (ref 11.5–15.5)
WBC: 13.4 10*3/uL — ABNORMAL HIGH (ref 4.0–10.5)
nRBC: 0 % (ref 0.0–0.2)

## 2022-12-17 LAB — GLUCOSE, CAPILLARY
Glucose-Capillary: 326 mg/dL — ABNORMAL HIGH (ref 70–99)
Glucose-Capillary: 287 mg/dL — ABNORMAL HIGH (ref 70–99)
Glucose-Capillary: 223 mg/dL — ABNORMAL HIGH (ref 70–99)
Glucose-Capillary: 231 mg/dL — ABNORMAL HIGH (ref 70–99)
Glucose-Capillary: 222 mg/dL — ABNORMAL HIGH (ref 70–99)

## 2022-12-17 LAB — BETA-HYDROXYBUTYRIC ACID
Beta-Hydroxybutyric Acid: >8 mmol/L — ABNORMAL HIGH (ref 0.05–0.27)
Beta-Hydroxybutyric Acid: >8 mmol/L — ABNORMAL HIGH (ref 0.05–0.27)

## 2022-12-17 LAB — BASIC METABOLIC PANEL
Anion gap: >20 — ABNORMAL HIGH (ref 5–15)
Anion gap: >20 — ABNORMAL HIGH (ref 5–15)
BUN: 23 mg/dL (ref 8–23)
BUN: 23 mg/dL (ref 8–23)
CO2: 8 mmol/L — ABNORMAL LOW (ref 22–32)
CO2: 11 mmol/L — ABNORMAL LOW (ref 22–32)
Calcium: 9 mg/dL (ref 8.9–10.3)
Calcium: 9.3 mg/dL (ref 8.9–10.3)
Chloride: 105 mmol/L (ref 98–111)
Chloride: 106 mmol/L (ref 98–111)
Creatinine, Ser: 0.63 mg/dL (ref 0.44–1.00)
Creatinine, Ser: 0.67 mg/dL (ref 0.44–1.00)
GFR, Estimated: >60 mL/min (ref >60)
GFR, Estimated: >60 mL/min (ref >60)
Glucose, Bld: 352 mg/dL — ABNORMAL HIGH (ref 70–99)
Glucose, Bld: 309 mg/dL — ABNORMAL HIGH (ref 70–99)
Potassium: 3.7 mmol/L (ref 3.5–5.1)
Potassium: 4.5 mmol/L (ref 3.5–5.1)
Sodium: 138 mmol/L (ref 135–145)
Sodium: 138 mmol/L (ref 135–145)

## 2022-12-17 LAB — TROPONIN I (HIGH SENSITIVITY): Troponin I (High Sensitivity): 8 ng/L (ref <18)

## 2022-12-17 LAB — LIPASE, BLOOD: Lipase: 28 U/L (ref 11–51)

## 2022-12-17 LAB — LACTIC ACID, PLASMA: Lactic Acid, Venous: <0.3 mmol/L — ABNORMAL LOW (ref 0.5–1.9)

## 2022-12-17 LAB — CBG MONITORING, ED
Glucose-Capillary: 439 mg/dL — ABNORMAL HIGH (ref 70–99)
Glucose-Capillary: 372 mg/dL — ABNORMAL HIGH (ref 70–99)

## 2022-12-17 LAB — PHOSPHORUS: Phosphorus: 5.6 mg/dL — ABNORMAL HIGH (ref 2.5–4.6)

## 2022-12-17 LAB — PROTIME-INR
INR: 1.1 (ref 0.8–1.2)
Prothrombin Time: 14.7 seconds (ref 11.4–15.2)

## 2022-12-17 LAB — I-STAT CG4 LACTIC ACID, ED: Lactic Acid, Venous: 2.8 mmol/L (ref 0.5–1.9)

## 2022-12-17 LAB — MAGNESIUM: Magnesium: 1.9 mg/dL (ref 1.7–2.4)

## 2022-12-17 LAB — MRSA NEXT GEN BY PCR, NASAL: MRSA by PCR Next Gen: NOT DETECTED

## 2022-12-17 LAB — AMMONIA: Ammonia: <10 umol/L (ref 9–35)

## 2022-12-17 MED ORDER — LACTATED RINGERS IV SOLN: INTRAVENOUS | Status: DC

## 2022-12-17 MED ORDER — DEXTROSE 50 % IV SOLN: 0.0000 mL | INTRAVENOUS | Status: DC | PRN

## 2022-12-17 MED ORDER — ACETAMINOPHEN 650 MG RE SUPP: 650.0000 mg | Freq: Four times a day (QID) | RECTAL | Status: DC | PRN

## 2022-12-17 MED ORDER — PROCHLORPERAZINE EDISYLATE 10 MG/2ML IJ SOLN
5.0000 mg | Freq: Four times a day (QID) | INTRAMUSCULAR | Status: AC | PRN
  Administered 2022-12-17: 5 mg via INTRAVENOUS
  Filled 2022-12-17: qty 2

## 2022-12-17 MED ORDER — LACTATED RINGERS IV BOLUS
20.0000 mL/kg | Freq: Once | INTRAVENOUS | Status: AC
  Administered 2022-12-17: 1588 mL via INTRAVENOUS

## 2022-12-17 MED ORDER — LACTATED RINGERS IV BOLUS
1000.0000 mL | Freq: Once | INTRAVENOUS | Status: AC
  Administered 2022-12-17: 1000 mL via INTRAVENOUS

## 2022-12-17 MED ORDER — SODIUM CHLORIDE 0.9 % IV SOLN
1.0000 g | Freq: Once | INTRAVENOUS | Status: AC
  Administered 2022-12-17: 1 g via INTRAVENOUS
  Filled 2022-12-17: qty 10

## 2022-12-17 MED ORDER — VANCOMYCIN HCL 1750 MG/350ML IV SOLN
1750.0000 mg | Freq: Once | INTRAVENOUS | Status: AC
  Administered 2022-12-17: 1750 mg via INTRAVENOUS
  Filled 2022-12-17: qty 350

## 2022-12-17 MED ORDER — DEXTROSE IN LACTATED RINGERS 5 % IV SOLN: INTRAVENOUS | Status: DC

## 2022-12-17 MED ORDER — HYDRALAZINE HCL 20 MG/ML IJ SOLN
10.0000 mg | Freq: Three times a day (TID) | INTRAMUSCULAR | Status: DC | PRN
  Administered 2022-12-17: 10 mg via INTRAVENOUS
  Filled 2022-12-17: qty 1

## 2022-12-17 MED ORDER — VANCOMYCIN HCL IN DEXTROSE 1-5 GM/200ML-% IV SOLN: 1000.0000 mg | Freq: Once | INTRAVENOUS | Status: DC

## 2022-12-17 MED ORDER — VANCOMYCIN HCL 1250 MG/250ML IV SOLN
1250.0000 mg | Freq: Once | INTRAVENOUS | Status: DC
  Filled 2022-12-17: qty 250

## 2022-12-17 MED ORDER — CHLORHEXIDINE GLUCONATE CLOTH 2 % EX PADS
6.0000 | MEDICATED_PAD | Freq: Every day | CUTANEOUS | Status: DC
  Administered 2022-12-18 – 2022-12-23 (×3): 6 via TOPICAL

## 2022-12-17 MED ORDER — LABETALOL HCL 5 MG/ML IV SOLN
10.0000 mg | INTRAVENOUS | Status: DC | PRN
  Administered 2022-12-17 – 2022-12-21 (×8): 10 mg via INTRAVENOUS
  Filled 2022-12-17 (×8): qty 4

## 2022-12-17 MED ORDER — VANCOMYCIN HCL 750 MG/150ML IV SOLN: 750.0000 mg | INTRAVENOUS | Status: DC

## 2022-12-17 MED ORDER — ONDANSETRON HCL 4 MG/2ML IJ SOLN
4.0000 mg | Freq: Once | INTRAMUSCULAR | Status: AC
  Administered 2022-12-17: 4 mg via INTRAVENOUS
  Filled 2022-12-17: qty 2

## 2022-12-17 MED ORDER — ACETAMINOPHEN 325 MG PO TABS
650.0000 mg | ORAL_TABLET | Freq: Four times a day (QID) | ORAL | Status: DC | PRN
  Administered 2022-12-17 – 2022-12-22 (×4): 650 mg via ORAL
  Filled 2022-12-17 (×4): qty 2

## 2022-12-17 MED ORDER — ONDANSETRON HCL 4 MG/2ML IJ SOLN
4.0000 mg | Freq: Four times a day (QID) | INTRAMUSCULAR | Status: DC | PRN
  Administered 2022-12-17 – 2022-12-20 (×3): 4 mg via INTRAVENOUS
  Filled 2022-12-17 (×3): qty 2

## 2022-12-17 MED ORDER — LABETALOL HCL 5 MG/ML IV SOLN
10.0000 mg | Freq: Once | INTRAVENOUS | Status: AC
  Administered 2022-12-17: 10 mg via INTRAVENOUS
  Filled 2022-12-17: qty 4

## 2022-12-17 MED ORDER — INSULIN REGULAR(HUMAN) IN NACL 100-0.9 UT/100ML-% IV SOLN
INTRAVENOUS | Status: DC
  Administered 2022-12-17: 13 [IU]/h via INTRAVENOUS
  Administered 2022-12-18: 9.5 [IU]/h via INTRAVENOUS
  Filled 2022-12-17 (×2): qty 100

## 2022-12-17 MED ORDER — LABETALOL HCL 5 MG/ML IV SOLN
5.0000 mg | INTRAVENOUS | Status: DC | PRN
  Administered 2022-12-17: 5 mg via INTRAVENOUS
  Filled 2022-12-17: qty 4

## 2022-12-17 MED ORDER — POTASSIUM CHLORIDE 10 MEQ/100ML IV SOLN
10.0000 meq | INTRAVENOUS | Status: AC
  Administered 2022-12-17 (×2): 10 meq via INTRAVENOUS
  Filled 2022-12-17 (×2): qty 100

## 2022-12-17 MED ORDER — ENOXAPARIN SODIUM 40 MG/0.4ML IJ SOSY
40.0000 mg | PREFILLED_SYRINGE | INTRAMUSCULAR | Status: DC
  Administered 2022-12-17 – 2022-12-22 (×6): 40 mg via SUBCUTANEOUS
  Filled 2022-12-17 (×6): qty 0.4
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
## 2022-12-18 DIAGNOSIS — I2510 Atherosclerotic heart disease of native coronary artery without angina pectoris: Secondary | ICD-10-CM | POA: Diagnosis present

## 2022-12-18 DIAGNOSIS — I10 Essential (primary) hypertension: Secondary | ICD-10-CM | POA: Diagnosis present

## 2022-12-18 DIAGNOSIS — F32A Depression, unspecified: Secondary | ICD-10-CM | POA: Diagnosis present

## 2022-12-18 DIAGNOSIS — L03115 Cellulitis of right lower limb: Secondary | ICD-10-CM | POA: Diagnosis present

## 2022-12-18 DIAGNOSIS — Z8249 Family history of ischemic heart disease and other diseases of the circulatory system: Secondary | ICD-10-CM | POA: Diagnosis not present

## 2022-12-18 DIAGNOSIS — E1110 Type 2 diabetes mellitus with ketoacidosis without coma: Secondary | ICD-10-CM | POA: Diagnosis present

## 2022-12-18 DIAGNOSIS — A419 Sepsis, unspecified organism: Secondary | ICD-10-CM | POA: Diagnosis present

## 2022-12-18 DIAGNOSIS — F17210 Nicotine dependence, cigarettes, uncomplicated: Secondary | ICD-10-CM | POA: Diagnosis not present

## 2022-12-18 DIAGNOSIS — R1310 Dysphagia, unspecified: Secondary | ICD-10-CM | POA: Diagnosis not present

## 2022-12-18 DIAGNOSIS — E876 Hypokalemia: Secondary | ICD-10-CM | POA: Diagnosis present

## 2022-12-18 DIAGNOSIS — E785 Hyperlipidemia, unspecified: Secondary | ICD-10-CM | POA: Diagnosis present

## 2022-12-18 DIAGNOSIS — Z955 Presence of coronary angioplasty implant and graft: Secondary | ICD-10-CM | POA: Diagnosis not present

## 2022-12-18 DIAGNOSIS — K2210 Ulcer of esophagus without bleeding: Secondary | ICD-10-CM | POA: Diagnosis present

## 2022-12-18 DIAGNOSIS — K209 Esophagitis, unspecified without bleeding: Secondary | ICD-10-CM | POA: Diagnosis not present

## 2022-12-18 DIAGNOSIS — Z888 Allergy status to other drugs, medicaments and biological substances status: Secondary | ICD-10-CM | POA: Diagnosis not present

## 2022-12-18 DIAGNOSIS — K319 Disease of stomach and duodenum, unspecified: Secondary | ICD-10-CM | POA: Diagnosis not present

## 2022-12-18 DIAGNOSIS — Z7984 Long term (current) use of oral hypoglycemic drugs: Secondary | ICD-10-CM | POA: Diagnosis not present

## 2022-12-18 DIAGNOSIS — K219 Gastro-esophageal reflux disease without esophagitis: Secondary | ICD-10-CM | POA: Diagnosis present

## 2022-12-18 DIAGNOSIS — Z79899 Other long term (current) drug therapy: Secondary | ICD-10-CM | POA: Diagnosis not present

## 2022-12-18 DIAGNOSIS — K269 Duodenal ulcer, unspecified as acute or chronic, without hemorrhage or perforation: Secondary | ICD-10-CM | POA: Diagnosis present

## 2022-12-18 DIAGNOSIS — K3189 Other diseases of stomach and duodenum: Secondary | ICD-10-CM | POA: Diagnosis not present

## 2022-12-18 DIAGNOSIS — E669 Obesity, unspecified: Secondary | ICD-10-CM | POA: Diagnosis present

## 2022-12-18 DIAGNOSIS — M1990 Unspecified osteoarthritis, unspecified site: Secondary | ICD-10-CM | POA: Diagnosis present

## 2022-12-18 DIAGNOSIS — Z794 Long term (current) use of insulin: Secondary | ICD-10-CM | POA: Diagnosis not present

## 2022-12-18 DIAGNOSIS — I160 Hypertensive urgency: Secondary | ICD-10-CM | POA: Diagnosis present

## 2022-12-18 DIAGNOSIS — G4733 Obstructive sleep apnea (adult) (pediatric): Secondary | ICD-10-CM | POA: Diagnosis present

## 2022-12-18 DIAGNOSIS — G9341 Metabolic encephalopathy: Secondary | ICD-10-CM | POA: Diagnosis present

## 2022-12-18 LAB — GLUCOSE, CAPILLARY
Glucose-Capillary: 124 mg/dL — ABNORMAL HIGH (ref 70–99)
Glucose-Capillary: 144 mg/dL — ABNORMAL HIGH (ref 70–99)
Glucose-Capillary: 153 mg/dL — ABNORMAL HIGH (ref 70–99)
Glucose-Capillary: 162 mg/dL — ABNORMAL HIGH (ref 70–99)
Glucose-Capillary: 168 mg/dL — ABNORMAL HIGH (ref 70–99)
Glucose-Capillary: 168 mg/dL — ABNORMAL HIGH (ref 70–99)
Glucose-Capillary: 169 mg/dL — ABNORMAL HIGH (ref 70–99)
Glucose-Capillary: 173 mg/dL — ABNORMAL HIGH (ref 70–99)
Glucose-Capillary: 178 mg/dL — ABNORMAL HIGH (ref 70–99)
Glucose-Capillary: 184 mg/dL — ABNORMAL HIGH (ref 70–99)
Glucose-Capillary: 197 mg/dL — ABNORMAL HIGH (ref 70–99)
Glucose-Capillary: 206 mg/dL — ABNORMAL HIGH (ref 70–99)
Glucose-Capillary: 259 mg/dL — ABNORMAL HIGH (ref 70–99)
Glucose-Capillary: 307 mg/dL — ABNORMAL HIGH (ref 70–99)

## 2022-12-18 MED ORDER — LISINOPRIL 10 MG PO TABS: 20.0000 mg | ORAL_TABLET | Freq: Every day | ORAL | Status: DC

## 2022-12-18 MED ORDER — POTASSIUM CHLORIDE CRYS ER 20 MEQ PO TBCR
40.0000 meq | EXTENDED_RELEASE_TABLET | ORAL | Status: AC
  Administered 2022-12-18 (×2): 40 meq via ORAL
  Filled 2022-12-18 (×2): qty 2

## 2022-12-18 MED ORDER — HYDRALAZINE HCL 20 MG/ML IJ SOLN
10.0000 mg | Freq: Four times a day (QID) | INTRAMUSCULAR | Status: DC | PRN
  Administered 2022-12-18: 10 mg via INTRAVENOUS
  Filled 2022-12-18: qty 1

## 2022-12-18 MED ORDER — INSULIN ASPART 100 UNIT/ML IJ SOLN
0.0000 [IU] | Freq: Three times a day (TID) | INTRAMUSCULAR | Status: DC
  Administered 2022-12-18 – 2022-12-19 (×2): 8 [IU] via SUBCUTANEOUS
  Administered 2022-12-19: 11 [IU] via SUBCUTANEOUS
  Administered 2022-12-19: 5 [IU] via SUBCUTANEOUS
  Administered 2022-12-20: 8 [IU] via SUBCUTANEOUS
  Administered 2022-12-20: 3 [IU] via SUBCUTANEOUS
  Administered 2022-12-20: 8 [IU] via SUBCUTANEOUS
  Administered 2022-12-21: 2 [IU] via SUBCUTANEOUS
  Administered 2022-12-21: 3 [IU] via SUBCUTANEOUS
  Administered 2022-12-22: 2 [IU] via SUBCUTANEOUS
  Administered 2022-12-22: 5 [IU] via SUBCUTANEOUS

## 2022-12-18 MED ORDER — INSULIN GLARGINE-YFGN 100 UNIT/ML ~~LOC~~ SOLN
15.0000 [IU] | Freq: Every day | SUBCUTANEOUS | Status: DC
  Administered 2022-12-18: 15 [IU] via SUBCUTANEOUS
  Filled 2022-12-18 (×2): qty 0.15

## 2022-12-18 MED ORDER — SODIUM CHLORIDE 0.9 % IV SOLN: INTRAVENOUS | Status: DC

## 2022-12-18 MED ORDER — POTASSIUM CHLORIDE 10 MEQ/100ML IV SOLN
10.0000 meq | Freq: Once | INTRAVENOUS | Status: AC
  Administered 2022-12-18: 10 meq via INTRAVENOUS
  Filled 2022-12-18: qty 100

## 2022-12-18 MED ORDER — LISINOPRIL 10 MG PO TABS
40.0000 mg | ORAL_TABLET | Freq: Once | ORAL | Status: AC
  Administered 2022-12-18: 40 mg via ORAL
  Filled 2022-12-18: qty 4

## 2022-12-18 MED ORDER — HYDRALAZINE HCL 20 MG/ML IJ SOLN
10.0000 mg | Freq: Four times a day (QID) | INTRAMUSCULAR | Status: DC | PRN
  Administered 2022-12-18 – 2022-12-21 (×4): 10 mg via INTRAVENOUS
  Filled 2022-12-18 (×4): qty 1

## 2022-12-18 MED ORDER — HYDRALAZINE HCL 20 MG/ML IJ SOLN
10.0000 mg | Freq: Once | INTRAMUSCULAR | Status: AC
  Administered 2022-12-18: 10 mg via INTRAVENOUS
  Filled 2022-12-18: qty 1

## 2022-12-18 MED ORDER — SODIUM CHLORIDE 0.9 % IV SOLN
12.5000 mg | Freq: Four times a day (QID) | INTRAVENOUS | Status: DC | PRN
  Administered 2022-12-19: 12.5 mg via INTRAVENOUS
  Filled 2022-12-18: qty 12.5

## 2022-12-18 MED ORDER — HYDRALAZINE HCL 50 MG PO TABS
50.0000 mg | ORAL_TABLET | Freq: Three times a day (TID) | ORAL | Status: DC
  Administered 2022-12-18 – 2022-12-23 (×14): 50 mg via ORAL
  Filled 2022-12-18 (×15): qty 1

## 2022-12-18 MED ORDER — VANCOMYCIN HCL IN DEXTROSE 1-5 GM/200ML-% IV SOLN
1000.0000 mg | INTRAVENOUS | Status: DC
  Administered 2022-12-18 – 2022-12-20 (×3): 1000 mg via INTRAVENOUS
  Filled 2022-12-18 (×3): qty 200

## 2022-12-18 MED ORDER — METOPROLOL TARTRATE 25 MG PO TABS
25.0000 mg | ORAL_TABLET | Freq: Two times a day (BID) | ORAL | Status: DC
  Administered 2022-12-18 (×2): 25 mg via ORAL
  Filled 2022-12-18 (×3): qty 1

## 2022-12-18 MED ORDER — INSULIN ASPART 100 UNIT/ML IJ SOLN
0.0000 [IU] | Freq: Every day | INTRAMUSCULAR | Status: DC
  Administered 2022-12-18: 4 [IU] via SUBCUTANEOUS
  Administered 2022-12-19: 2 [IU] via SUBCUTANEOUS
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
## 2022-12-18 NOTE — Progress Notes (Signed)
PROGRESS NOTE  Sarah Charles  DOB: 10-21-1960  PCP: [name], MD NWG:[id number]  DOA: 12/17/2022  LOS: 0 days  Hospital Day: 2  Brief narrative: Sarah Charles is a 62 y.o. female with PMH significant for obesity, OSA, DM2, HTN, HLD, CAD/LAD stent 2019, depression, arthritis, GERD. 7/26, patient presented to ED with 2 days of nausea, vomiting, unable to hold down food or drink and progressing to generalized weakness.  She is on insulin but has not been using for last 2 days because she had not been able to eat anything. 7/2, seen by PCP for first-degree burn on her right leg that she sustained while making [place] fries. She was placed on Keflex x 7 days.  She also saw wound care, last visit on Monday 7/22 and was told that her burn wound was healing well.  In the ED, she had no fever, heart rate 107, blood pressure 170/93, breathing on room air Right lower extremity was found to have extensive redness Initial labs showed glucose significantly elevated to 525, anion gap 29 VBG showed acidotic pH of 7.17, pCO2 27, bicarb low at 10 Lactic acid 2.8, beta-hydroxybutyrate is elevated to more than 8 WBC count 13.4, hemoglobin 15.9 Urinalysis showed clear yellow urine negative for leukocytes or nitrite UDS negative CT head negative for acute intracranial abnormality Chest x-ray unremarkable Right tibia and fibula x-ray unremarkable Blood culture sent Patient was started on broad-spectrum IV antibiotics, IV insulin drip, IV fluids Admitted to [facility] for DKA and right lower extremity wound/cellulitis cellulitis  Subjective: Patient was seen and examined this morning.  Pleasant middle-aged Caucasian female.  Looks older for her age.  Propped up on bed.  Not in distress.  No nausea or vomiting. She was started on clear liquid diet which she tolerated Patient is unable to confirm her home insulin requirement. Chart reviewed. Remains afebrile, heart rate in 80s, blood pressure in 180s to 200s  overnight Serial BMP, VBG were obtained in the last 24 hours Most recent blood work from this morning with potassium 2.9, anion gap closed, serum ketone level down to normal.  Assessment and plan: Sepsis - POA RLE burn wounds/cellulitis Presented with redness of right lower extremity despite completion of antibiotic treatment for burn wound  Scattered patches of redness with dried scabs noted throughout right lower extremity. Met sepsis criteria with tachycardia, leukocytosis, lactic acidosis RLE x-ray without evidence of abscess or bony involvement Blood culture sent Currently on IV antibiotics, IV fluid  Lactic acid level improving.  WBC count up, continue to trend Wound care to follow Recent Labs  Lab 12/17/22 1305 12/17/22 1316 12/17/22 2021 12/18/22 0307  WBC 13.4*  --   --  16.5*  LATICACIDVEN  --  2.8* <0.3*  --    Severe DKA In the setting of RLE cellulitis, poorly controlled diabetes, and skipping of insulin because of nausea, vomiting  Initial labs suggestive of DKA as above showing acidotic pH, elevated ketones, elevated anion gap Started on DKA protocol with insulin drip, IV fluid Most recent blood work from this morning showed improvement in anion gap, serum bicarb Nausea/vomiting improving.  Tolerated clear liquid diet.  Advance to regular consistency carb controlled diet Switch from insulin drip to subcutaneous insulin this afternoon. Monitor bicarb trend Recent Labs  Lab 12/17/22 1305 12/17/22 1929 12/17/22 2021 12/17/22 2322 12/18/22 0307 12/18/22 0821  CO2 9* 8* 11* 12* 15* 18*   Type 2 diabetes mellitus uncontrolled A1c 9.5 on 11/23/2022 PTA meds-insulin but patient is not  able to confirm the dose and frequency Switched from insulin drip to subcutaneous insulin with 15 units Lantus and moderate SSI/Accu-Cheks. Recent Labs  Lab 12/18/22 0556 12/18/22 0720 12/18/22 0820 12/18/22 0924 12/18/22 1038  GLUCAP 197* 184* 168* 162* 153*   Nausea and  vomiting No abdominal pain or tenderness.  Urinalysis normal.  Lipase normal and no significant elevation of LFTs.   Symptoms likely due to DKA Improving As needed antiemetics Watch out renal function Recent Labs    02/25/22 1053 04/24/22 1339 06/14/22 1943 12/17/22 1305 12/17/22 1316 12/17/22 1929 12/17/22 2021 12/17/22 2322 12/18/22 0307 12/18/22 0821  BUN 20 19 20  28* 28* 23 23 24* 25* 23  CREATININE 0.71 0.67 0.89 1.04* 0.60 0.63 0.67 0.65 0.51 0.47   Hypertensive urgency Blood pressure was significantly elevated overnight over 200.   PTA meds-unable to confirm home meds.  But in the past, she seems to be taking metoprolol, lisinopril, Lasix I will start metoprolol 12.5 mg daily and lisinopril 20 mg daily with hydralazine as needed  Acute metabolic encephalopathy Patient is alert, awake, slow to respond.  Tries to answer questions but not fully oriented.  Unclear baseline mental status.  Continue to monitor  CAD/LAD stent - 2019 HLD No cardiac symptoms  Obesity  Body mass index is 31 kg/m. Patient has been advised to make an attempt to improve diet and exercise patterns to aid in weight loss.  OSA  Depression PTA meds-prompted to review med list from home  Arthritis  GERD   Mobility: PT eval ordered  Goals of care   Code Status: Full Code    DVT prophylaxis:  enoxaparin (LOVENOX) injection 40 mg Start: 12/17/22 2200   Antimicrobials: IV ceftriaxone, IV vancomycin Fluid: NS at 75 mill per hour Consultants: None Family Communication: None at bedside  Status: Observation Level of care:  Stepdown   Patient from: Home Anticipated d/c to: Pending clinical course Needs to continue in-hospital care:  Improving DKA.    Diet:  Diet Order             Diet Carb Modified Fluid consistency: Thin; Room service appropriate? Yes  Diet effective now                   Scheduled Meds:  Chlorhexidine Gluconate Cloth  6 each Topical Q0600    enoxaparin (LOVENOX) injection  40 mg Subcutaneous Q24H   insulin aspart  0-15 Units Subcutaneous TID WC   insulin aspart  0-5 Units Subcutaneous QHS   insulin glargine-yfgn  15 Units Subcutaneous Daily   [START ON 12/19/2022] lisinopril  20 mg Oral Daily   metoprolol tartrate  25 mg Oral BID   potassium chloride  40 mEq Oral Q2H    PRN meds: acetaminophen **OR** acetaminophen, dextrose, hydrALAZINE, labetalol, ondansetron (ZOFRAN) IV   Infusions:   sodium chloride     vancomycin      Antimicrobials: Anti-infectives (From admission, onward)    Start     Dose/Rate Route Frequency Ordered Stop   12/18/22 1700  vancomycin (VANCOREADY) IVPB 750 mg/150 mL  Status:  Discontinued        750 mg 150 mL/hr over 60 Minutes Intravenous Every 24 hours 12/17/22 1646 12/18/22 1120   12/18/22 1700  vancomycin (VANCOCIN) IVPB 1000 mg/200 mL premix        1,000 mg 200 mL/hr over 60 Minutes Intravenous Every 24 hours 12/18/22 1120     12/17/22 1700  vancomycin (VANCOREADY) IVPB 1750 mg/350 mL  1,750 mg 175 mL/hr over 120 Minutes Intravenous  Once 12/17/22 1646 12/17/22 2057   12/17/22 1530  vancomycin (VANCOCIN) IVPB 1000 mg/200 mL premix  Status:  Discontinued        1,000 mg 200 mL/hr over 60 Minutes Intravenous  Once 12/17/22 1526 12/17/22 1529   12/17/22 1530  cefTRIAXone (ROCEPHIN) 1 g in sodium chloride 0.9 % 100 mL IVPB        1 g 200 mL/hr over 30 Minutes Intravenous  Once 12/17/22 1526 12/17/22 1733   12/17/22 1530  vancomycin (VANCOREADY) IVPB 1250 mg/250 mL  Status:  Discontinued        1,250 mg 166.7 mL/hr over 90 Minutes Intravenous  Once 12/17/22 1529 12/17/22 1645       Nutritional status:  Body mass index is 31 kg/m.          Objective: Vitals:   12/18/22 0900 12/18/22 1145  BP: (!) 188/69   Pulse: 82   Resp: (!) 25   Temp:  98.2 F (36.8 C)  SpO2: 96%     Intake/Output Summary (Last 24 hours) at 12/18/2022 1405 Last data filed at 12/18/2022 1300 Gross  per 24 hour  Intake 4721.96 ml  Output --  Net 4721.96 ml   Filed Weights   12/17/22 1337  Weight: 79.4 kg   Weight change:  Body mass index is 31 kg/m.   Physical Exam: General exam: Pleasant, not in distress Skin: No rashes, lesions or ulcers. HEENT: Atraumatic, normocephalic, no obvious bleeding Lungs: Clear to auscultation bilaterally CVS: Regular rate and rhythm no murmur GI/Abd soft, nontender, nondistended, bowel sound present CNS: Alert, awake, slow to respond, oriented to place and person Psychiatry: Sad affect Extremities: No pedal edema, no calf tenderness  Data Review: I have personally reviewed the laboratory data and studies available.  F/u labs ordered Unresulted Labs (From admission, onward)     Start     Ordered   12/19/22 0500  CBC with Differential/Platelet  Daily,   R     Question:  Specimen collection method  Answer:  Lab=Lab collect   12/18/22 0817   12/19/22 0500  Basic metabolic panel  Daily,   R     Question:  Specimen collection method  Answer:  Lab=Lab collect   12/18/22 0817   12/19/22 0500  Phosphorus  Tomorrow morning,   R       Question:  Specimen collection method  Answer:  Lab=Lab collect   12/18/22 0817   12/19/22 0500  Magnesium  Tomorrow morning,   R       Question:  Specimen collection method  Answer:  Lab=Lab collect   12/18/22 0817   12/17/22 1629  HIV Antibody (routine testing w rflx)  (HIV Antibody (Routine testing w reflex) panel)  Once,   R        12/17/22 1630            Total time spent in review of labs and imaging, patient evaluation, formulation of plan, documentation and communication with family: 65 minutes  Signed, Lorin Glass, MD [facility] 12/18/2022

## 2022-12-19 DIAGNOSIS — E1110 Type 2 diabetes mellitus with ketoacidosis without coma: Secondary | ICD-10-CM | POA: Diagnosis not present

## 2022-12-19 LAB — CBC WITH DIFFERENTIAL/PLATELET
Abs Immature Granulocytes: 0.08 10*3/uL — ABNORMAL HIGH (ref 0.00–0.07)
Basophils Absolute: 0 10*3/uL (ref 0.0–0.1)
Basophils Relative: 0 %
Eosinophils Absolute: 0 10*3/uL (ref 0.0–0.5)
Eosinophils Relative: 0 %
HCT: 45.5 % (ref 36.0–46.0)
Hemoglobin: 15.1 g/dL — ABNORMAL HIGH (ref 12.0–15.0)
Immature Granulocytes: 0 %
Lymphocytes Relative: 9 %
Lymphs Abs: 1.6 10*3/uL (ref 0.7–4.0)
MCH: 29.5 pg (ref 26.0–34.0)
MCHC: 33.2 g/dL (ref 30.0–36.0)
MCV: 88.9 fL (ref 80.0–100.0)
Monocytes Absolute: 1.6 10*3/uL — ABNORMAL HIGH (ref 0.1–1.0)
Monocytes Relative: 8 %
Neutro Abs: 15.2 10*3/uL — ABNORMAL HIGH (ref 1.7–7.7)
Neutrophils Relative %: 83 %
Platelets: 345 10*3/uL (ref 150–400)
RBC: 5.12 MIL/uL — ABNORMAL HIGH (ref 3.87–5.11)
RDW: 14.2 % (ref 11.5–15.5)
WBC: 18.5 10*3/uL — ABNORMAL HIGH (ref 4.0–10.5)
nRBC: 0 % (ref 0.0–0.2)

## 2022-12-19 LAB — BASIC METABOLIC PANEL WITH GFR
Anion gap: 9 (ref 5–15)
BUN: 20 mg/dL (ref 8–23)
CO2: 18 mmol/L — ABNORMAL LOW (ref 22–32)
Calcium: 8.6 mg/dL — ABNORMAL LOW (ref 8.9–10.3)
Chloride: 108 mmol/L (ref 98–111)
Creatinine, Ser: 0.47 mg/dL (ref 0.44–1.00)
GFR, Estimated: >60 mL/min (ref >60)
Glucose, Bld: 291 mg/dL — ABNORMAL HIGH (ref 70–99)
Potassium: 3.5 mmol/L (ref 3.5–5.1)
Sodium: 135 mmol/L (ref 135–145)

## 2022-12-19 LAB — GLUCOSE, CAPILLARY
Glucose-Capillary: 254 mg/dL — ABNORMAL HIGH (ref 70–99)
Glucose-Capillary: 342 mg/dL — ABNORMAL HIGH (ref 70–99)
Glucose-Capillary: 283 mg/dL — ABNORMAL HIGH (ref 70–99)
Glucose-Capillary: 210 mg/dL — ABNORMAL HIGH (ref 70–99)
Glucose-Capillary: 226 mg/dL — ABNORMAL HIGH (ref 70–99)

## 2022-12-19 LAB — MAGNESIUM: Magnesium: 1.5 mg/dL — ABNORMAL LOW (ref 1.7–2.4)

## 2022-12-19 LAB — PHOSPHORUS: Phosphorus: 1.6 mg/dL — ABNORMAL LOW (ref 2.5–4.6)

## 2022-12-19 MED ORDER — PANTOPRAZOLE SODIUM 40 MG PO TBEC
40.0000 mg | DELAYED_RELEASE_TABLET | Freq: Every day | ORAL | Status: DC
  Administered 2022-12-19 – 2022-12-20 (×2): 40 mg via ORAL
  Filled 2022-12-19 (×2): qty 1

## 2022-12-19 MED ORDER — K PHOS MONO-SOD PHOS DI & MONO 155-852-130 MG PO TABS
500.0000 mg | ORAL_TABLET | Freq: Two times a day (BID) | ORAL | Status: AC
  Administered 2022-12-19 (×2): 500 mg via ORAL
  Filled 2022-12-19 (×3): qty 2

## 2022-12-19 MED ORDER — BACITRACIN 500 UNIT/GM EX OINT
TOPICAL_OINTMENT | Freq: Every day | CUTANEOUS | Status: DC
  Administered 2022-12-19 – 2022-12-22 (×4): 15.7778 via TOPICAL
  Filled 2022-12-19: qty 14
  Filled 2022-12-19: qty 0.9

## 2022-12-19 MED ORDER — HYDROCERIN EX CREA
TOPICAL_CREAM | Freq: Every day | CUTANEOUS | Status: DC
  Filled 2022-12-19: qty 113

## 2022-12-19 MED ORDER — MIRTAZAPINE 15 MG PO TBDP
15.0000 mg | ORAL_TABLET | Freq: Every day | ORAL | Status: DC
  Administered 2022-12-19 – 2022-12-22 (×4): 15 mg via ORAL
  Filled 2022-12-19 (×4): qty 1

## 2022-12-19 MED ORDER — LISINOPRIL 20 MG PO TABS
40.0000 mg | ORAL_TABLET | Freq: Every day | ORAL | Status: DC
  Administered 2022-12-19 – 2022-12-23 (×4): 40 mg via ORAL
  Filled 2022-12-19: qty 4
  Filled 2022-12-19 (×3): qty 2

## 2022-12-19 MED ORDER — MAGNESIUM SULFATE 2 GM/50ML IV SOLN
2.0000 g | Freq: Once | INTRAVENOUS | Status: AC
  Administered 2022-12-19: 2 g via INTRAVENOUS
  Filled 2022-12-19: qty 50

## 2022-12-19 MED ORDER — INSULIN GLARGINE-YFGN 100 UNIT/ML ~~LOC~~ SOLN
25.0000 [IU] | Freq: Every day | SUBCUTANEOUS | Status: DC
  Administered 2022-12-19: 25 [IU] via SUBCUTANEOUS
  Filled 2022-12-19 (×2): qty 0.25

## 2022-12-19 MED ORDER — POTASSIUM PHOSPHATES 15 MMOLE/5ML IV SOLN
30.0000 mmol | Freq: Once | INTRAVENOUS | Status: AC
  Administered 2022-12-19: 30 mmol via INTRAVENOUS
  Filled 2022-12-19: qty 10

## 2022-12-19 MED ORDER — CLONIDINE HCL 0.1 MG PO TABS
0.1000 mg | ORAL_TABLET | Freq: Every day | ORAL | Status: DC
  Administered 2022-12-19 – 2022-12-23 (×4): 0.1 mg via ORAL
  Filled 2022-12-19 (×4): qty 1

## 2022-12-19 MED ORDER — METOPROLOL SUCCINATE ER 25 MG PO TB24
25.0000 mg | ORAL_TABLET | Freq: Every day | ORAL | Status: DC
  Administered 2022-12-19 – 2022-12-23 (×4): 25 mg via ORAL
  Filled 2022-12-19 (×4): qty 1

## 2022-12-19 NOTE — Progress Notes (Signed)
PROGRESS NOTE  Sarah Charles  DOB: June 25, 1960  PCP: [name], MD TKP:[id number]  DOA: 12/17/2022  LOS: 1 day  Hospital Day: 3  Brief narrative: Sarah Charles is a 62 y.o. female with PMH significant for obesity, OSA, DM2, HTN, HLD, CAD/LAD stent 2019, depression, arthritis, GERD. 7/26, patient presented to ED with 2 days of nausea, vomiting, unable to hold down food or drink and progressing to generalized weakness.  She is on insulin but has not been using for last 2 days because she had not been able to eat anything. 7/2, seen by PCP for first-degree burn on her right leg that she sustained while making [place] fries. She was placed on Keflex x 7 days.  She also saw wound care, last visit on Monday 7/22 and was told that her burn wound was healing well.  In the ED, she had no fever, heart rate 107, blood pressure 170/93, breathing on room air Right lower extremity was found to have extensive redness Initial labs showed glucose significantly elevated to 525, anion gap 29 VBG showed acidotic pH of 7.17, pCO2 27, bicarb low at 10 Lactic acid 2.8, beta-hydroxybutyrate is elevated to more than 8 WBC count 13.4, hemoglobin 15.9 Urinalysis showed clear yellow urine negative for leukocytes or nitrite UDS negative CT head negative for acute intracranial abnormality Chest x-ray unremarkable Right tibia and fibula x-ray unremarkable Blood culture sent Patient was started on broad-spectrum IV antibiotics, IV insulin drip, IV fluids Admitted to [facility] for DKA and right lower extremity wound/cellulitis cellulitis  Subjective: Patient was seen and examined this morning.   Propped up in bed.  Not in distress.  Gradually improving mental status.   Blood pressure running elevated. No family at bedside at the time of my evaluation.  Assessment and plan: Sepsis - POA RLE burn wounds/cellulitis Presented with redness of right lower extremity despite completion of antibiotic treatment for burn wound   Scattered patches of redness with dried scabs noted throughout right lower extremity. Met sepsis criteria with tachycardia, leukocytosis, lactic acidosis RLE x-ray without evidence of abscess or bony involvement Blood culture did not show any growth so far Currently on IV antibiotics, IV fluid  Lactic acid level improving.  WBC count up, continue to trend Wound care to follow Recent Labs  Lab 12/17/22 1305 12/17/22 1316 12/17/22 2021 12/18/22 0307 12/19/22 0302  WBC 13.4*  --   --  16.5* 18.5*  LATICACIDVEN  --  2.8* <0.3*  --   --    Severe DKA In the setting of RLE cellulitis, poorly controlled diabetes, and skipping of insulin because of nausea, vomiting  Initial labs suggestive of DKA as above showing acidotic pH, elevated ketones, elevated anion gap Started on DKA protocol with insulin drip, IV fluid Subsequent blood work showed improvement in anion gap, serum bicarb Nausea/vomiting improved.  Switched to subcutaneous insulin.  Able to tolerate regular consistency carb controlled diet Bicarb trend improving. Recent Labs  Lab 12/17/22 1305 12/17/22 1929 12/17/22 2021 12/17/22 2322 12/18/22 0307 12/18/22 0821 12/19/22 0302  CO2 9* 8* 11* 12* 15* 18* 18*   Type 2 diabetes mellitus uncontrolled A1c 9.5 on 11/23/2022 PTA meds-insulin but patient is not able to confirm the dose and frequency Yesterday she was started on 15 units of Lantus.  Blood sugar trend as below consistently over 200.  Dose increased to 25 units Lantus this morning.  Continue moderate SSI/Accu-Cheks. Recent Labs  Lab 12/18/22 1750 12/18/22 2144 12/19/22 0255 12/19/22 0738 12/19/22 1151  GLUCAP  259* 307* 254* 342* 283*   Hypokalemia/hypomagnesemia/hypophosphatemia All electrolytes are low due to DKA Replacement given Recent Labs  Lab 12/17/22 1305 12/17/22 1316 12/17/22 2021 12/17/22 2322 12/18/22 0307 12/18/22 0821 12/19/22 0302  K 4.1   < > 4.5 3.9 3.4* 2.9* 3.5  MG 1.9  --   --   --    --   --  1.5*  PHOS 5.6*  --   --   --   --   --  1.6*   < > = values in this interval not displayed.    Hypertensive urgency Blood pressure was significantly elevated overnight over 200.   PTA meds-unable to confirm home meds.  But in the past, she seems to be taking metoprolol, lisinopril, Lasix Blood pressure running elevated.  medicines adjusted this morning to Toprol 25 mg daily, lisinopril 40 mg daily, and clonidine 0.1 mg daily Also on IV hydralazine as needed.  Acute metabolic encephalopathy Patient is alert, awake, slow to respond.  Tries to answer questions but not fully oriented.  Unclear baseline mental status.  Continue to monitor  CAD/LAD stent - 2019 HLD No cardiac symptoms  Obesity  Body mass index is 31 kg/m. Patient has been advised to make an attempt to improve diet and exercise patterns to aid in weight loss.  OSA  Depression  Arthritis  GERD   Mobility: PT eval pending  Goals of care   Code Status: Full Code    DVT prophylaxis:  enoxaparin (LOVENOX) injection 40 mg Start: 12/17/22 2200   Antimicrobials: IV ceftriaxone, IV vancomycin Fluid: NS at 75 mill per hour Consultants: None Family Communication: None at bedside.  Called and updated patient's daughter this afternoon.  Status: Inpatient. Level of care:  Progressive   Patient from: Home Anticipated d/c to: Pending clinical course Needs to continue in-hospital care:  Improving DKA.    Diet:  Diet Order             Diet Carb Modified Fluid consistency: Thin; Room service appropriate? Yes  Diet effective now                   Scheduled Meds:  bacitracin   Topical Daily   Chlorhexidine Gluconate Cloth  6 each Topical Q0600   cloNIDine  0.1 mg Oral Daily   enoxaparin (LOVENOX) injection  40 mg Subcutaneous Q24H   hydrALAZINE  50 mg Oral Q8H   hydrocerin   Topical Daily   insulin aspart  0-15 Units Subcutaneous TID WC   insulin aspart  0-5 Units Subcutaneous QHS   insulin  glargine-yfgn  25 Units Subcutaneous Daily   lisinopril  40 mg Oral Daily   metoprolol succinate  25 mg Oral Daily   mirtazapine  15 mg Oral QHS   pantoprazole  40 mg Oral Daily   phosphorus  500 mg Oral BID    PRN meds: acetaminophen **OR** acetaminophen, dextrose, hydrALAZINE, labetalol, ondansetron (ZOFRAN) IV, promethazine (PHENERGAN) injection (IM or IVPB)   Infusions:   sodium chloride 75 mL/hr at 12/19/22 0639   promethazine (PHENERGAN) injection (IM or IVPB) Stopped (12/19/22 0258)   vancomycin Stopped (12/18/22 1911)    Antimicrobials: Anti-infectives (From admission, onward)    Start     Dose/Rate Route Frequency Ordered Stop   12/18/22 1700  vancomycin (VANCOREADY) IVPB 750 mg/150 mL  Status:  Discontinued        750 mg 150 mL/hr over 60 Minutes Intravenous Every 24 hours 12/17/22 1646 12/18/22 1120  12/18/22 1700  vancomycin (VANCOCIN) IVPB 1000 mg/200 mL premix        1,000 mg 200 mL/hr over 60 Minutes Intravenous Every 24 hours 12/18/22 1120     12/17/22 1700  vancomycin (VANCOREADY) IVPB 1750 mg/350 mL        1,750 mg 175 mL/hr over 120 Minutes Intravenous  Once 12/17/22 1646 12/17/22 2057   12/17/22 1530  vancomycin (VANCOCIN) IVPB 1000 mg/200 mL premix  Status:  Discontinued        1,000 mg 200 mL/hr over 60 Minutes Intravenous  Once 12/17/22 1526 12/17/22 1529   12/17/22 1530  cefTRIAXone (ROCEPHIN) 1 g in sodium chloride 0.9 % 100 mL IVPB        1 g 200 mL/hr over 30 Minutes Intravenous  Once 12/17/22 1526 12/17/22 1733   12/17/22 1530  vancomycin (VANCOREADY) IVPB 1250 mg/250 mL  Status:  Discontinued        1,250 mg 166.7 mL/hr over 90 Minutes Intravenous  Once 12/17/22 1529 12/17/22 1645       Nutritional status:  Body mass index is 31 kg/m.          Objective: Vitals:   12/19/22 1220 12/19/22 1454  BP:  139/67  Pulse:    Resp:    Temp: 99.5 F (37.5 C)   SpO2:      Intake/Output Summary (Last 24 hours) at 12/19/2022 1610 Last data  filed at 12/19/2022 0800 Gross per 24 hour  Intake 1552.4 ml  Output 650 ml  Net 902.4 ml   Filed Weights   12/17/22 1337  Weight: 79.4 kg   Weight change:  Body mass index is 31 kg/m.   Physical Exam: General exam: Pleasant, not in distress Skin: No rashes, lesions or ulcers. HEENT: Atraumatic, normocephalic, no obvious bleeding Lungs: Clear to auscultation bilaterally CVS: Regular rate and rhythm no murmur GI/Abd soft, nontender, nondistended, bowel sound present CNS: Alert, awake, slow to respond, oriented to place and person Psychiatry: Mood appropriate Extremities: No pedal edema, no calf tenderness  Data Review: I have personally reviewed the laboratory data and studies available.  F/u labs ordered Unresulted Labs (From admission, onward)     Start     Ordered   12/19/22 0500  CBC with Differential/Platelet  Daily,   R     Question:  Specimen collection method  Answer:  Lab=Lab collect   12/18/22 0817   12/19/22 0500  Basic metabolic panel  Daily,   R     Question:  Specimen collection method  Answer:  Lab=Lab collect   12/18/22 0817            Total time spent in review of labs and imaging, patient evaluation, formulation of plan, documentation and communication with family: 55 minutes  Signed, Lorin Glass, MD [facility] 12/19/2022

## 2022-12-19 NOTE — Consult Note (Signed)
WOC Nurse Consult Note: Reason for Consult:thermal skin injujry. Sustained on 11/25/22. Seen by the [facility] wound provider L.D. [place] II, PA-C) last on 12/13/22. Please see note from that encounter. The provider's orders for care are transcribed for Nursing today. Wound type:Thermal. Pressure Injury POA: N/A Measurement:No measured today, Bedside RN to measure and document on Nursing Flow Sheet. Wound bed:See photodocumentation provided by EDP Drainage (amount, consistency, odor) None Periwound:erythematous Dressing procedure/placement/frequency: Conservative care therapies initiated by Burn Provider are transcribed for Nursing and consist of using a silicone foam to the upper thigh wound and bacitracin ointment topped with xeroform gauze to the right medial calf wound. Eucerin cream is to be applied to he healed areas of the RLE thermal injury.  WOC nursing team will not follow, but will remain available to this patient, the nursing and medical teams.  Please re-consult if needed.  Thank you for inviting [place] to participate in this patient's Plan of Care.  Ladona Mow, MSN, RN, CNS, GNP, Leda Min, [employer], [employer] phone:  [phone]

## 2022-12-19 NOTE — Evaluation (Signed)
Physical Therapy Evaluation Patient Details Name: Sarah Charles MRN: [id number] DOB: 1961-05-03 Today's Date: 12/19/2022  History of Present Illness  62 yo female admitted with DKA, Sepsis, L LE wound, cellulitis, N/V. Hx of DM, CAD, obesity, OSA, OA, GERD, depression  Clinical Impression  On eval, pt was Mod A for mobility. Pt presents with general weakness, decreased activity tolerance, and impaired gait and balance. She required increased time and encouragement to complete all tasks. She is currently at risk for falls when mobilizing. No family present during session. Will plan to follow and progress activity as tolerated. Patient will benefit from continued inpatient follow up therapy, <3 hours/day.         Assistance Recommended at Discharge Frequent or constant Supervision/Assistance  If plan is discharge home, recommend the following:  Can travel by private vehicle  A little help with walking and/or transfers;A little help with bathing/dressing/bathroom;Assistance with cooking/housework;Assist for transportation;Help with stairs or ramp for entrance   Yes    Equipment Recommendations None recommended by PT (pt reports she already has a RW)  Recommendations for Other Services  OT consult    Functional Status Assessment Patient has had a recent decline in their functional status and demonstrates the ability to make significant improvements in function in a reasonable and predictable amount of time.     Precautions / Restrictions Precautions Precautions: Fall Restrictions Weight Bearing Restrictions: No      Mobility  Bed Mobility Overal bed mobility: Needs Assistance Bed Mobility: Supine to Sit     Supine to sit: Mod assist, HOB elevated     General bed mobility comments: Increased time. Assist for trunk, bil LEs, and to scoot to EOB utilizing bedpad. Cues provided for pt.    Transfers Overall transfer level: Needs assistance Equipment used: Rolling walker (2  wheels) Transfers: Sit to/from Stand Sit to Stand: Mod assist, From elevated surface           General transfer comment: Assist to power up, stabilize, control descent. Increased time. Cues for safety, hand placement, technqiue.    Ambulation/Gait Ambulation/Gait assistance: Min assist Gait Distance (Feet): 10 Feet Assistive device: Rolling walker (2 wheels) Gait Pattern/deviations: Decreased stride length, Decreased step length - right, Decreased step length - left, Step-through pattern       General Gait Details: Assist to stabilize pt throughout distance. Cues for safety, RW proximity. Followed with recliner and used it to transport pt back to bedside.  Stairs            Wheelchair Mobility     Tilt Bed    Modified Rankin (Stroke Patients Only)       Balance Overall balance assessment: Needs assistance         Standing balance support: Bilateral upper extremity supported, Reliant on assistive device for balance, During functional activity Standing balance-Leahy Scale: Poor                               Pertinent Vitals/Pain Pain Assessment Pain Assessment: No/denies pain    Home Living Family/patient expects to be discharged to:: Private residence Living Arrangements: Children Available Help at Discharge: Family Type of Home: Mobile home Home Access: Stairs to enter Entrance Stairs-Rails: Doctor, general practice of Steps: 5   Home Layout: One level Home Equipment: Agricultural consultant (2 wheels);Cane - single point      Prior Function Prior Level of Function : Independent/Modified Independent  Mobility Comments: using RW most recently ADLs Comments: mod ind     Hand Dominance        Extremity/Trunk Assessment   Upper Extremity Assessment Upper Extremity Assessment: Defer to OT evaluation    Lower Extremity Assessment Lower Extremity Assessment: Generalized weakness    Cervical / Trunk  Assessment Cervical / Trunk Assessment: Normal  Communication   Communication: No difficulties  Cognition Arousal/Alertness: Awake/alert Behavior During Therapy: WFL for tasks assessed/performed Overall Cognitive Status: Within Functional Limits for tasks assessed                                 General Comments: can easily fall asleep        General Comments      Exercises     Assessment/Plan    PT Assessment Patient needs continued PT services  PT Problem List Decreased strength;Decreased range of motion;Decreased activity tolerance;Decreased balance;Decreased mobility;Decreased knowledge of use of DME;Pain       PT Treatment Interventions DME instruction;Therapeutic exercise;Gait training;Balance training;Functional mobility training;Therapeutic activities;Patient/family education;Stair training    PT Goals (Current goals can be found in the Care Plan section)  Acute Rehab PT Goals Patient Stated Goal: to feel better PT Goal Formulation: With patient Time For Goal Achievement: 01/02/23 Potential to Achieve Goals: Good    Frequency Min 1X/week     Co-evaluation               AM-PAC PT "6 Clicks" Mobility  Outcome Measure Help needed turning from your back to your side while in a flat bed without using bedrails?: A Lot Help needed moving from lying on your back to sitting on the side of a flat bed without using bedrails?: A Lot Help needed moving to and from a bed to a chair (including a wheelchair)?: A Lot Help needed standing up from a chair using your arms (e.g., wheelchair or bedside chair)?: A Lot Help needed to walk in hospital room?: A Lot Help needed climbing 3-5 steps with a railing? : A Lot 6 Click Score: 12    End of Session Equipment Utilized During Treatment: Gait belt Activity Tolerance: Patient tolerated treatment well;Patient limited by fatigue Patient left: in chair;with call bell/phone within reach;with chair alarm set    PT Visit Diagnosis: Muscle weakness (generalized) (M62.81);Difficulty in walking, not elsewhere classified (R26.2)    Time: 8295-6213 PT Time Calculation (min) (ACUTE ONLY): 28 min   Charges:   PT Evaluation $PT Eval Low Complexity: 1 Low PT Treatments $Gait Training: 8-22 mins PT General Charges $$ ACUTE PT VISIT: 1 Visit            Faye Ramsay, PT Acute Rehabilitation  Office: [phone]

## 2022-12-19 NOTE — Plan of Care (Signed)
  Problem: Skin Integrity: Goal: Skin integrity will improve Outcome: Progressing   Problem: Education: Goal: Knowledge of General Education information will improve Description: Including pain rating scale, medication(s)/side effects and non-pharmacologic comfort measures Outcome: Progressing   Problem: Health Behavior/Discharge Planning: Goal: Ability to manage health-related needs will improve Outcome: Progressing   Problem: Clinical Measurements: Goal: Ability to maintain clinical measurements within normal limits will improve Outcome: Progressing Goal: Diagnostic test results will improve Outcome: Progressing Goal: Respiratory complications will improve Outcome: Progressing Goal: Cardiovascular complication will be avoided Outcome: Progressing   Problem: Activity: Goal: Risk for activity intolerance will decrease Outcome: Progressing   Problem: Coping: Goal: Level of anxiety will decrease Outcome: Progressing   Problem: Elimination: Goal: Will not experience complications related to bowel motility Outcome: Progressing Goal: Will not experience complications related to urinary retention Outcome: Progressing   Problem: Pain Managment: Goal: General experience of comfort will improve Outcome: Progressing   Problem: Safety: Goal: Ability to remain free from injury will improve Outcome: Progressing   Problem: Skin Integrity: Goal: Risk for impaired skin integrity will decrease Outcome: Progressing   Problem: Education: Goal: Ability to describe self-care measures that may prevent or decrease complications (Diabetes Survival Skills Education) will improve Outcome: Progressing Goal: Individualized Educational Video(s) Outcome: Progressing   Problem: Coping: Goal: Ability to adjust to condition or change in health will improve Outcome: Progressing   Problem: Fluid Volume: Goal: Ability to maintain a balanced intake and output will improve Outcome:  Progressing   Problem: Health Behavior/Discharge Planning: Goal: Ability to identify and utilize available resources and services will improve Outcome: Progressing Goal: Ability to manage health-related needs will improve Outcome: Progressing   Problem: Metabolic: Goal: Ability to maintain appropriate glucose levels will improve Outcome: Progressing   Problem: Nutritional: Goal: Progress toward achieving an optimal weight will improve Outcome: Progressing   Problem: Skin Integrity: Goal: Risk for impaired skin integrity will decrease Outcome: Progressing   Problem: Tissue Perfusion: Goal: Adequacy of tissue perfusion will improve Outcome: Progressing

## 2022-12-19 NOTE — Plan of Care (Signed)
  Problem: Clinical Measurements: Goal: Ability to avoid or minimize complications of infection will improve Outcome: Progressing   Problem: Skin Integrity: Goal: Skin integrity will improve Outcome: Progressing   Problem: Education: Goal: Knowledge of General Education information will improve Description: Including pain rating scale, medication(s)/side effects and non-pharmacologic comfort measures Outcome: Progressing   

## 2022-12-20 DIAGNOSIS — E1110 Type 2 diabetes mellitus with ketoacidosis without coma: Secondary | ICD-10-CM | POA: Diagnosis not present

## 2022-12-20 DIAGNOSIS — R1310 Dysphagia, unspecified: Secondary | ICD-10-CM | POA: Diagnosis not present

## 2022-12-20 DIAGNOSIS — K219 Gastro-esophageal reflux disease without esophagitis: Secondary | ICD-10-CM | POA: Diagnosis not present

## 2022-12-20 LAB — GLUCOSE, CAPILLARY
Glucose-Capillary: 293 mg/dL — ABNORMAL HIGH (ref 70–99)
Glucose-Capillary: 257 mg/dL — ABNORMAL HIGH (ref 70–99)
Glucose-Capillary: 152 mg/dL — ABNORMAL HIGH (ref 70–99)
Glucose-Capillary: 136 mg/dL — ABNORMAL HIGH (ref 70–99)

## 2022-12-20 MED ORDER — POTASSIUM CHLORIDE CRYS ER 20 MEQ PO TBCR
40.0000 meq | EXTENDED_RELEASE_TABLET | Freq: Once | ORAL | Status: AC
  Administered 2022-12-20: 40 meq via ORAL
  Filled 2022-12-20: qty 2

## 2022-12-20 MED ORDER — INSULIN GLARGINE-YFGN 100 UNIT/ML ~~LOC~~ SOLN
30.0000 [IU] | Freq: Every day | SUBCUTANEOUS | Status: DC
Start: 2022-12-20 — End: 2022-12-20

## 2022-12-20 MED ORDER — INSULIN GLARGINE-YFGN 100 UNIT/ML ~~LOC~~ SOLN
40.0000 [IU] | Freq: Every day | SUBCUTANEOUS | Status: DC
  Administered 2022-12-20 – 2022-12-21 (×2): 40 [IU] via SUBCUTANEOUS
  Filled 2022-12-20 (×3): qty 0.4

## 2022-12-20 MED ORDER — ALUM & MAG HYDROXIDE-SIMETH 200-200-20 MG/5ML PO SUSP
30.0000 mL | Freq: Four times a day (QID) | ORAL | Status: DC | PRN
  Administered 2022-12-20: 30 mL via ORAL
  Filled 2022-12-20: qty 30

## 2022-12-20 NOTE — Inpatient Diabetes Management (Signed)
Inpatient Diabetes Program Recommendations  AACE/ADA: New Consensus Statement on Inpatient Glycemic Control (2015)  Target Ranges:  Prepandial:   less than 140 mg/dL      Peak postprandial:   less than 180 mg/dL (1-2 hours)      Critically ill patients:  140 - 180 mg/dL    Latest Reference Range & Units 12/19/22 07:38 12/19/22 11:51 12/19/22 17:26 12/19/22 22:02  Glucose-Capillary 70 - 99 mg/dL 409 (H)  11 units Novolog  283 (H)  8 units Novolog  25 units Semglee 210 (H)  5 units Novolog  226 (H)  2 units Novolog   (H): Data is abnormally high  Latest Reference Range & Units 12/20/22 07:27  Glucose-Capillary 70 - 99 mg/dL 811 (H)  (H): Data is abnormally high    Home DM Meds: Lantus 55 units QPM      Metformin 500 mg BID      Ozempic 2 mg Qweek    Current Orders: Semglee 25 units QAM     Novolog 0-15 units TID ac/hs     Last A1c was 9.5% at PCP visit (was up from 7.4%) PCP is Dr. Alvis Lemmings with [facility] Last Seen 11/23/2022 Was told to Increase the Lantus from 50 units to 55 units QPM      MD- Note CBG 293 this AM--Afternoon CBGs elevated yest as well  Please consider:  1. Increase Semglee to 40 units Daily (if 25 unit dose already given this AM, please also give an extra 15 units Semglee X1 dose this AM) Pt takes 55 units Lantus at home   2. Start Novolog Meal Coverage: Novolog 6 units TID with meals HOLD if pt NPO HOLD if pt eats <50% meal    --Will follow patient during hospitalization--  Ambrose Finland RN, MSN, CDCES Diabetes Coordinator Inpatient Glycemic Control Team Team Pager: [phone] (8a-5p)

## 2022-12-20 NOTE — Plan of Care (Signed)
  Problem: Clinical Measurements: Goal: Ability to avoid or minimize complications of infection will improve Outcome: Progressing   Problem: Skin Integrity: Goal: Skin integrity will improve Outcome: Progressing   Problem: Education: Goal: Knowledge of General Education information will improve Description Including pain rating scale, medication(s)/side effects and non-pharmacologic comfort measures Outcome: Progressing   Problem: Safety: Goal: Ability to remain free from injury will improve Outcome: Progressing   Problem: Skin Integrity: Goal: Risk for impaired skin integrity will decrease Outcome: Progressing   
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
## 2022-12-20 NOTE — TOC Initial Note (Signed)
Transition of Care [facility]) - Initial/Assessment Note    Patient Details  Name: Sarah Charles MRN: [id number] Date of Birth: 1960/12/17  Transition of Care [facility]) CM/SW Contact:    Larrie Kass, LCSW Phone Number: 12/20/2022, 11:18 AM  Clinical Narrative:                 CSW met with pt at bedside regarding SNF recommendation. Pt reports living alone ,but sometimes her daughter is there to check on her. Pt reports wanting to return home after hospitalization. CSW explained the process and why PT recommended SNF placement. Pt reports she would like to try home health first. CSW explained the differences in [facility] and SNF placement. CSW also examined the barriers to arranging [facility] with pt's insurance. Pt has verbalized understanding and would like to try [facility]. Pt reports having a walker and cane at home, she has no DME needs. Pt reports she will need transportation assistance at discharge.  CSW to work on arranging [facility]. TOC to follow.   Expected Discharge Plan: Home w Home Health Services Barriers to Discharge: Continued Medical Work up   Patient Goals and CMS Choice Patient states their goals for this hospitalization and ongoing recovery are:: retrun home with home health CMS Medicare.gov Compare Post Acute Care list provided to:: Patient Choice offered to / list presented to : Patient      Expected Discharge Plan and Services In-house Referral: Clinical Social Work     Living arrangements for the past 2 months: Mobile Home                                      Prior Living Arrangements/Services Living arrangements for the past 2 months: Mobile Home Lives with:: Self, Adult Children Patient language and need for interpreter reviewed:: Yes Do you feel safe going back to the place where you live?: Yes      Need for Family Participation in Patient Care: No (Comment) Care giver support system in place?: No (comment) Current home services: DME Criminal Activity/Legal Involvement  Pertinent to Current Situation/Hospitalization: No - Comment as needed  Activities of Daily Living Home Assistive Devices/Equipment: Environmental consultant (specify type), Cane (specify quad or straight) ADL Screening (condition at time of admission) Patient's cognitive ability adequate to safely complete daily activities?: Yes Is the patient deaf or have difficulty hearing?: No Does the patient have difficulty seeing, even when wearing glasses/contacts?: No Does the patient have difficulty concentrating, remembering, or making decisions?: Yes Patient able to express need for assistance with ADLs?: Yes Does the patient have difficulty dressing or bathing?: Yes Independently performs ADLs?: No Does the patient have difficulty walking or climbing stairs?: Yes Weakness of Legs: Both Weakness of Arms/Hands: Both  Permission Sought/Granted                  Emotional Assessment Appearance:: Appears stated age Attitude/Demeanor/Rapport: Gracious, Engaged Affect (typically observed): Accepting Orientation: : Oriented to Self, Oriented to Place, Oriented to  Time, Oriented to Situation   Psych Involvement: No (comment)  Admission diagnosis:  DKA (diabetic ketoacidosis) (HCC) [E11.10] Diabetic ketoacidosis without coma associated with type 2 diabetes mellitus (HCC) [E11.10] Patient Active Problem List   Diagnosis Date Noted   DKA (diabetic ketoacidosis) (HCC) 12/17/2022   Cellulitis 12/17/2022   Nausea and vomiting 12/17/2022   Hypertensive urgency 12/17/2022   Hyperlipidemia LDL goal <70 07/19/2022   Sleep apnea, unspecified 11/26/2020  CAD (coronary artery disease) 11/22/2017   Chest pain, precordial 11/04/2017   Acute chest pain    Type 2 diabetes mellitus with hyperlipidemia (HCC) 10/29/2017   Diabetic polyneuropathy associated with type 2 diabetes mellitus (HCC) 05/30/2017   Mixed dyslipidemia 05/30/2017   Depression 05/31/2016   Uncontrolled type 2 diabetes mellitus with complication  04/08/2016   Uncontrolled type 2 diabetes mellitus with complication, with long-term current use of insulin 04/20/2015   Cellulitis of breast 12/29/2013   Diabetes mellitus (HCC) 12/29/2013   Cellulitis of female breast 12/29/2013   Symptomatic menopausal or female climacteric states 04/08/2008   OTHER SPECIFIED DISEASE OF NAIL 04/08/2008   URINARY URGENCY 04/08/2008   COUGH 04/02/2008   FATIGUE 04/12/2007   Hypertension 04/12/2007   OBESITY, NOS 07/21/2006   Major depressive disorder, recurrent episode (HCC) 07/21/2006   TOBACCO DEPENDENCE 07/21/2006   GASTROESOPHAGEAL REFLUX, NO ESOPHAGITIS 07/21/2006   INCONTINENCE, STRESS, FEMALE 07/21/2006   PCP:  [name], MD Pharmacy:   [facility] - [place], [place] - [address] [place] [address] Phone: [phone] Fax: [phone]     Social Determinants of Health (SDOH) Social History: SDOH Screenings   Food Insecurity: No Food Insecurity (12/19/2022)  Housing: Low Risk  (12/19/2022)  Transportation Needs: No Transportation Needs (12/19/2022)  Utilities: Not At Risk (12/19/2022)  Alcohol Screen: Low Risk  (12/22/2020)  Depression (PHQ2-9): Low Risk  (01/22/2022)  Financial Resource Strain: Low Risk  (12/22/2020)  Physical Activity: Unknown (12/22/2020)  Social Connections: Socially Isolated (12/22/2020)  Stress: No Stress Concern Present (12/22/2020)  Tobacco Use: High Risk (12/17/2022)   SDOH Interventions:     Readmission Risk Interventions     No data to display
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
## 2022-12-21 ENCOUNTER — Inpatient Hospital Stay (HOSPITAL_COMMUNITY): Payer: 59 | Admitting: Anesthesiology

## 2022-12-21 ENCOUNTER — Encounter (HOSPITAL_COMMUNITY)
Admission: EM | Disposition: A | Discharge status: Home / Self Care | Payer: Self-pay | Source: Home / Self Care | Attending: Internal Medicine

## 2022-12-21 DIAGNOSIS — F17210 Nicotine dependence, cigarettes, uncomplicated: Secondary | ICD-10-CM | POA: Diagnosis not present

## 2022-12-21 DIAGNOSIS — E1110 Type 2 diabetes mellitus with ketoacidosis without coma: Secondary | ICD-10-CM | POA: Diagnosis not present

## 2022-12-21 DIAGNOSIS — K269 Duodenal ulcer, unspecified as acute or chronic, without hemorrhage or perforation: Secondary | ICD-10-CM

## 2022-12-21 DIAGNOSIS — I2510 Atherosclerotic heart disease of native coronary artery without angina pectoris: Secondary | ICD-10-CM

## 2022-12-21 DIAGNOSIS — K2210 Ulcer of esophagus without bleeding: Secondary | ICD-10-CM

## 2022-12-21 HISTORY — PX: ESOPHAGOGASTRODUODENOSCOPY (EGD) WITH PROPOFOL: SHX5813

## 2022-12-21 HISTORY — PX: BIOPSY: SHX5522

## 2022-12-21 LAB — GLUCOSE, CAPILLARY
Glucose-Capillary: 161 mg/dL — ABNORMAL HIGH (ref 70–99)
Glucose-Capillary: 131 mg/dL — ABNORMAL HIGH (ref 70–99)
Glucose-Capillary: 86 mg/dL (ref 70–99)
Glucose-Capillary: 75 mg/dL (ref 70–99)
Glucose-Capillary: 136 mg/dL — ABNORMAL HIGH (ref 70–99)

## 2022-12-21 LAB — BASIC METABOLIC PANEL WITH GFR
Anion gap: 9 (ref 5–15)
BUN: 6 mg/dL — ABNORMAL LOW (ref 8–23)
CO2: 26 mmol/L (ref 22–32)
Calcium: 8.3 mg/dL — ABNORMAL LOW (ref 8.9–10.3)
Chloride: 103 mmol/L (ref 98–111)
Creatinine, Ser: 0.41 mg/dL — ABNORMAL LOW (ref 0.44–1.00)
GFR, Estimated: >60 mL/min (ref >60)
Glucose, Bld: 181 mg/dL — ABNORMAL HIGH (ref 70–99)
Potassium: 2.7 mmol/L — CL (ref 3.5–5.1)
Sodium: 138 mmol/L (ref 135–145)

## 2022-12-21 LAB — BASIC METABOLIC PANEL
Anion gap: 8 (ref 5–15)
BUN: <5 mg/dL — ABNORMAL LOW (ref 8–23)
CO2: 28 mmol/L (ref 22–32)
Calcium: 8.4 mg/dL — ABNORMAL LOW (ref 8.9–10.3)
Chloride: 102 mmol/L (ref 98–111)
Creatinine, Ser: 0.33 mg/dL — ABNORMAL LOW (ref 0.44–1.00)
GFR, Estimated: >60 mL/min (ref >60)
Glucose, Bld: 140 mg/dL — ABNORMAL HIGH (ref 70–99)
Potassium: 3.3 mmol/L — ABNORMAL LOW (ref 3.5–5.1)
Sodium: 138 mmol/L (ref 135–145)

## 2022-12-21 SURGERY — ESOPHAGOGASTRODUODENOSCOPY (EGD) WITH PROPOFOL
Anesthesia: Monitor Anesthesia Care

## 2022-12-21 MED ORDER — SODIUM CHLORIDE 0.9 % IV SOLN: INTRAVENOUS | Status: DC

## 2022-12-21 MED ORDER — FENTANYL CITRATE (PF) 100 MCG/2ML IJ SOLN
INTRAMUSCULAR | Status: AC
  Filled 2022-12-21: qty 2

## 2022-12-21 MED ORDER — FENTANYL CITRATE (PF) 100 MCG/2ML IJ SOLN
25.0000 ug | INTRAMUSCULAR | Status: DC | PRN
  Administered 2022-12-21: 50 ug via INTRAVENOUS

## 2022-12-21 MED ORDER — ONDANSETRON HCL 4 MG/2ML IJ SOLN: 4.0000 mg | Freq: Four times a day (QID) | INTRAMUSCULAR | Status: DC | PRN

## 2022-12-21 MED ORDER — LACTATED RINGERS IV SOLN: INTRAVENOUS | Status: DC

## 2022-12-21 MED ORDER — PROPOFOL 500 MG/50ML IV EMUL
INTRAVENOUS | Status: DC | PRN
  Administered 2022-12-21: 75 ug/kg/min via INTRAVENOUS

## 2022-12-21 MED ORDER — SUCRALFATE 1 GM/10ML PO SUSP
1.0000 g | Freq: Three times a day (TID) | ORAL | Status: DC
  Administered 2022-12-21 – 2022-12-23 (×7): 1 g via ORAL
  Filled 2022-12-21 (×7): qty 10

## 2022-12-21 MED ORDER — OXYCODONE HCL 5 MG/5ML PO SOLN: 5.0000 mg | Freq: Once | ORAL | Status: DC | PRN

## 2022-12-21 MED ORDER — OXYCODONE HCL 5 MG PO TABS: 5.0000 mg | ORAL_TABLET | Freq: Once | ORAL | Status: DC | PRN

## 2022-12-21 MED ORDER — PANTOPRAZOLE SODIUM 40 MG PO TBEC
40.0000 mg | DELAYED_RELEASE_TABLET | Freq: Two times a day (BID) | ORAL | Status: DC
  Administered 2022-12-21 – 2022-12-23 (×4): 40 mg via ORAL
  Filled 2022-12-21 (×4): qty 1

## 2022-12-21 MED ORDER — LACTATED RINGERS IV SOLN: INTRAVENOUS | Status: DC | PRN

## 2022-12-21 MED ORDER — POTASSIUM CHLORIDE 10 MEQ/100ML IV SOLN
10.0000 meq | INTRAVENOUS | Status: AC
  Administered 2022-12-21 (×6): 10 meq via INTRAVENOUS
  Filled 2022-12-21 (×6): qty 100

## 2022-12-21 SURGICAL SUPPLY — 15 items

## 2022-12-21 NOTE — Plan of Care (Signed)
  Problem: Clinical Measurements: Goal: Ability to avoid or minimize complications of infection will improve Outcome: Progressing   Problem: Skin Integrity: Goal: Skin integrity will improve Outcome: Progressing   Problem: Health Behavior/Discharge Planning: Goal: Ability to manage health-related needs will improve Outcome: Progressing   Problem: Activity: Goal: Risk for activity intolerance will decrease Outcome: Progressing   Problem: Nutrition: Goal: Adequate nutrition will be maintained Outcome: Progressing

## 2022-12-21 NOTE — Op Note (Signed)
[facility] Patient Name: Sarah Charles Procedure Date: 12/21/2022 MRN: [id number] Attending MD: Kerin Salen , MD, [id number] Date of Birth: 04-11-1961 CSN: [id number] Age: 62 Admit Type: Inpatient Procedure:                Upper GI endoscopy Indications:              Dysphagia, Odynophagia Providers:                Kerin Salen, MD, Norman Clay, RN, Stephens Shire RN, RN,                            Beryle Beams, Technician Referring MD:             [facility] Medicines:                See the Anesthesia note for documentation of the                            administered medications Complications:            No immediate complications. Estimated blood loss:                            Minimal. Estimated Blood Loss:     Estimated blood loss: none. Procedure:                Pre-Anesthesia Assessment:                           - Prior to the procedure, a History and Physical                            was performed, and patient medications and                            allergies were reviewed. The patient's tolerance of                            previous anesthesia was also reviewed. The risks                            and benefits of the procedure and the sedation                            options and risks were discussed with the patient.                            All questions were answered, and informed consent                            was obtained. Prior Anticoagulants: The patient has                            taken no anticoagulant or antiplatelet agents  except for aspirin. ASA Grade Assessment: III - A                            patient with severe systemic disease. After                            reviewing the risks and benefits, the patient was                            deemed in satisfactory condition to undergo the                            procedure.                           After obtaining informed consent, the  endoscope was                            passed under direct vision. Throughout the                            procedure, the patient's blood pressure, pulse, and                            oxygen saturations were monitored continuously. The                            [id number]-[id number] ([id number] Olympus endoscope was introduced                            through the mouth, and advanced to the second part                            of duodenum. The upper GI endoscopy was                            accomplished without difficulty. The patient                            tolerated the procedure well. Scope In: Scope Out: Findings:      Many superficial esophageal ulcers were found 20 to 35 cm from the       incisors. Biopsies were taken with a cold forceps for histology.      LA Grade D (one or more mucosal breaks involving at least 75% of       esophageal circumference) esophagitis with no bleeding was found 20 to       35 cm from the incisors.      Diffuse mildly erythematous mucosa without bleeding was found in the       entire examined stomach. Biopsies were taken with a cold forceps for       Helicobacter pylori testing.      The cardia and gastric fundus were normal on retroflexion.      Many non-bleeding superficial duodenal ulcers with pigmented material       were found in the  duodenal bulb, in the first portion of the duodenum       and in the second portion of the duodenum. The largest lesion was 8 mm       in largest dimension. Impression:               - Esophageal ulcers. Biopsied.                           - LA Grade D esophagitis with no bleeding.                           - Erythematous mucosa in the stomach. Biopsied.                           - Non-bleeding duodenal ulcers with pigmented                            material. Moderate Sedation:      Patient did not receive moderate sedation for this procedure, but       instead received monitored anesthesia care. Recommendation:            - Await pathology results.                           - Mechanical soft diet.                           - Continue present medications.                           - PPI BID for 2 months and sucralfate 1 gm QID for                            1 month. Procedure Code(s):        --- Professional ---                           [phone], Esophagogastroduodenoscopy, flexible,                            transoral; with biopsy, single or multiple Diagnosis Code(s):        --- Professional ---                           K22.10, Ulcer of esophagus without bleeding                           K20.90, Esophagitis, unspecified without bleeding                           K31.89, Other diseases of stomach and duodenum                           K26.9, Duodenal ulcer, unspecified as acute or                            chronic, without hemorrhage  or perforation                           R13.10, Dysphagia, unspecified CPT copyright 2022 [employer]. All rights reserved. The codes documented in this report are preliminary and upon coder review may  be revised to meet current compliance requirements. Kerin Salen, MD 12/21/2022 2:39:51 PM This report has been signed electronically. Number of Addenda: 0

## 2022-12-21 NOTE — Progress Notes (Signed)
PROGRESS NOTE  Sarah Charles  DOB: 31-Oct-1960  PCP: [name], MD ZOX:[id number]  DOA: 12/17/2022  LOS: 3 days  Hospital Day: 5  Brief narrative: Sarah Charles is a 62 y.o. female with PMH significant for obesity, OSA, DM2, HTN, HLD, CAD/LAD stent 2019, depression, arthritis, GERD. 7/26, patient presented to ED with 2 days of nausea, vomiting, unable to hold down food or drink and progressing to generalized weakness.  She is on insulin but has not been using for last 2 days because she had not been able to eat anything. 7/2, seen by PCP for first-degree burn on her right leg that she sustained while making [place] fries. She was placed on Keflex x 7 days.  She also saw wound care, last visit on Monday 7/22 and was told that her burn wound was healing well.  In the ED, she had no fever, heart rate 107, blood pressure 170/93, breathing on room air Right lower extremity was found to have extensive redness Initial labs showed glucose significantly elevated to 525, anion gap 29 VBG showed acidotic pH of 7.17, pCO2 27, bicarb low at 10 Lactic acid 2.8, beta-hydroxybutyrate is elevated to more than 8 WBC count 13.4, hemoglobin 15.9 Urinalysis showed clear yellow urine negative for leukocytes or nitrite UDS negative CT head negative for acute intracranial abnormality Chest x-ray unremarkable Right tibia and fibula x-ray unremarkable Blood culture sent Patient was started on broad-spectrum IV antibiotics, IV insulin drip, IV fluids Admitted to [facility] for DKA and right lower extremity wound/cellulitis cellulitis  Subjective: Patient was seen and examined this morning.   Propped up in bed.  Not in distress.  N.p.o. for EGD today. Labs from this morning showed potassium low at 2.7.  Replacement given.  Assessment and plan: Sepsis - POA RLE burn wounds/cellulitis Presented with redness of right lower extremity despite completion of antibiotic treatment for burn wound  Scattered patches of  redness with dried scabs noted throughout right lower extremity. Met sepsis criteria with tachycardia, leukocytosis, lactic acidosis RLE x-ray without evidence of abscess or bony involvement Blood culture did not show any growth so far Treated with broad-spectrum antibiotics.  Okay to stop today. Recent Labs  Lab 12/17/22 1305 12/17/22 1316 12/17/22 2021 12/18/22 0307 12/19/22 0302 12/20/22 0355 12/21/22 0410  WBC 13.4*  --   --  16.5* 18.5* 10.1 8.4  LATICACIDVEN  --  2.8* <0.3*  --   --   --   --    Severe DKA In the setting of RLE cellulitis, poorly controlled diabetes, and skipping of insulin because of nausea, vomiting  Initial labs suggestive of DKA as above showing acidotic pH, elevated ketones, elevated anion gap Initially treated with DKA protocol with insulin drip, IV fluid Subsequent blood work showed improvement in anion gap, serum bicarb Nausea/vomiting improved.  Switched to subcutaneous insulin.  Able to tolerate regular consistency carb controlled diet Recent Labs  Lab 12/17/22 1305 12/17/22 1929 12/17/22 2021 12/17/22 2322 12/18/22 0307 12/18/22 0821 12/19/22 0302 12/20/22 0355 12/21/22 0410 12/21/22 1111  CO2 9* 8* 11* 12* 15* 18* 18* 20* 26 28   Type 2 diabetes mellitus uncontrolled A1c 9.5 on 11/23/2022 PTA meds-insulin but patient is not able to confirm the dose and frequency Blood sugar being monitored and insulin to being adjusted.   Currently on Lantus 40 units daily and SSI with Accu-Cheks Diabetes care coordinator consult appreciated. Recent Labs  Lab 12/20/22 1128 12/20/22 1706 12/20/22 2053 12/21/22 0736 12/21/22 1124  GLUCAP 257* 152* 136*  161* 131*   Odynophagia/heartburn H/o EGD and dilation several years ago GI consulted.  Pending EGD today.  Hypokalemia/hypomagnesemia/hypophosphatemia All electrolytes are low due to DKA Potassium significantly low today.  Placement given.  Recheck tomorrow Recent Labs  Lab 12/17/22 1305  12/17/22 1316 12/18/22 0821 12/19/22 0302 12/20/22 0355 12/21/22 0410 12/21/22 1111  K 4.1   < > 2.9* 3.5 3.2* 2.7* 3.3*  MG 1.9  --   --  1.5*  --   --   --   PHOS 5.6*  --   --  1.6*  --   --   --    < > = values in this interval not displayed.   Hypertensive urgency Blood pressure was significantly elevated overnight over 200.   PTA meds-unable to confirm home meds.   Medicines have been adjusted to Toprol 25 mg daily, lisinopril 40 mg daily, and clonidine 0.1 mg daily Also on IV hydralazine as needed.  Continue to monitor.  Acute metabolic encephalopathy Patient is alert, awake, slow to respond.  Mental status improving  CAD/LAD stent - 2019 HLD No cardiac symptoms  Obesity  Body mass index is 29.81 kg/m. Patient has been advised to make an attempt to improve diet and exercise patterns to aid in weight loss.  OSA  Depression  Arthritis  GERD   Mobility: PT eval pending  Goals of care   Code Status: Full Code    DVT prophylaxis:  enoxaparin (LOVENOX) injection 40 mg Start: 12/17/22 2200   Antimicrobials: Completed course Fluid: Gentle hydration because of poor oral intake. Consultants: None Family Communication: None at bedside.   Status: Inpatient. Level of care:  Progressive   Patient from: Home Anticipated d/c to: Pending clinical course Needs to continue in-hospital care:  Improving DKA.  Pending EGD for possible dilation.    Diet:  Diet Order             Diet NPO time specified  Diet effective midnight                   Scheduled Meds:  bacitracin   Topical Daily   Chlorhexidine Gluconate Cloth  6 each Topical Q0600   cloNIDine  0.1 mg Oral Daily   enoxaparin (LOVENOX) injection  40 mg Subcutaneous Q24H   hydrALAZINE  50 mg Oral Q8H   hydrocerin   Topical Daily   insulin aspart  0-15 Units Subcutaneous TID WC   insulin aspart  0-5 Units Subcutaneous QHS   insulin glargine-yfgn  40 Units Subcutaneous Daily   lisinopril  40 mg  Oral Daily   metoprolol succinate  25 mg Oral Daily   mirtazapine  15 mg Oral QHS   pantoprazole  40 mg Oral Daily    PRN meds: acetaminophen **OR** acetaminophen, alum & mag hydroxide-simeth, dextrose, hydrALAZINE, labetalol, ondansetron (ZOFRAN) IV, promethazine (PHENERGAN) injection (IM or IVPB)   Infusions:   sodium chloride 75 mL/hr at 12/21/22 0550   promethazine (PHENERGAN) injection (IM or IVPB) Stopped (12/19/22 0258)    Antimicrobials: Anti-infectives (From admission, onward)    Start     Dose/Rate Route Frequency Ordered Stop   12/18/22 1700  vancomycin (VANCOREADY) IVPB 750 mg/150 mL  Status:  Discontinued        750 mg 150 mL/hr over 60 Minutes Intravenous Every 24 hours 12/17/22 1646 12/18/22 1120   12/18/22 1700  vancomycin (VANCOCIN) IVPB 1000 mg/200 mL premix  Status:  Discontinued        1,000 mg 200  mL/hr over 60 Minutes Intravenous Every 24 hours 12/18/22 1120 12/21/22 1329   12/17/22 1700  vancomycin (VANCOREADY) IVPB 1750 mg/350 mL        1,750 mg 175 mL/hr over 120 Minutes Intravenous  Once 12/17/22 1646 12/17/22 2057   12/17/22 1530  vancomycin (VANCOCIN) IVPB 1000 mg/200 mL premix  Status:  Discontinued        1,000 mg 200 mL/hr over 60 Minutes Intravenous  Once 12/17/22 1526 12/17/22 1529   12/17/22 1530  cefTRIAXone (ROCEPHIN) 1 g in sodium chloride 0.9 % 100 mL IVPB        1 g 200 mL/hr over 30 Minutes Intravenous  Once 12/17/22 1526 12/17/22 1733   12/17/22 1530  vancomycin (VANCOREADY) IVPB 1250 mg/250 mL  Status:  Discontinued        1,250 mg 166.7 mL/hr over 90 Minutes Intravenous  Once 12/17/22 1529 12/17/22 1645       Nutritional status:  Body mass index is 29.81 kg/m.          Objective: Vitals:   12/21/22 1000 12/21/22 1253  BP: (!) 155/75 (!) 167/70  Pulse: 69 77  Resp:    Temp:  99.1 F (37.3 C)  SpO2:  95%    Intake/Output Summary (Last 24 hours) at 12/21/2022 1329 Last data filed at 12/21/2022 1300 Gross per 24 hour   Intake 0 ml  Output 4600 ml  Net -4600 ml   Filed Weights   12/17/22 1337 12/19/22 1624  Weight: 79.4 kg 76.3 kg   Weight change:  Body mass index is 29.81 kg/m.   Physical Exam: General exam: Pleasant, not in distress.  No pain.  Feels weak. Skin: No rashes, lesions or ulcers. HEENT: Atraumatic, normocephalic, no obvious bleeding Lungs: Clear to auscultation bilaterally.  Mild tenderness in the midsternal area CVS: Regular rate and rhythm no murmur GI/Abd soft, nontender, nondistended, bowel sound present CNS: Alert, awake, slow to respond, oriented to place and person Psychiatry: Mood appropriate Extremities: No pedal edema, no calf tenderness  Data Review: I have personally reviewed the laboratory data and studies available.  F/u labs ordered Unresulted Labs (From admission, onward)     Start     Ordered   12/22/22 0500  Basic metabolic panel  Daily,   R     Question:  Specimen collection method  Answer:  Lab=Lab collect   12/21/22 0831   12/22/22 0500  CBC with Differential/Platelet  Daily,   R     Question:  Specimen collection method  Answer:  Lab=Lab collect   12/21/22 0831            Total time spent in review of labs and imaging, patient evaluation, formulation of plan, documentation and communication with family: 45 minutes  Signed, Lorin Glass, MD [facility] 12/21/2022

## 2022-12-21 NOTE — Anesthesia Preprocedure Evaluation (Signed)
Anesthesia Evaluation  Patient identified by MRN, date of birth, ID band Patient awake    Reviewed: Allergy & Precautions, H&P , NPO status , Patient's Chart, lab work & pertinent test results  Airway Mallampati: II   Neck ROM: full    Dental   Pulmonary sleep apnea , Current Smoker   breath sounds clear to auscultation       Cardiovascular hypertension, + CAD and + Cardiac Stents   Rhythm:regular Rate:Normal     Neuro/Psych  PSYCHIATRIC DISORDERS  Depression       GI/Hepatic ,GERD  ,,  Endo/Other  diabetes, Type 2    Renal/GU      Musculoskeletal  (+) Arthritis ,    Abdominal   Peds  Hematology   Anesthesia Other Findings   Reproductive/Obstetrics                             Anesthesia Physical Anesthesia Plan  ASA: 3  Anesthesia Plan: MAC   Post-op Pain Management:    Induction: Intravenous  PONV Risk Score and Plan: 1 and Propofol infusion and Treatment may vary due to age or medical condition  Airway Management Planned: Nasal Cannula  Additional Equipment:   Intra-op Plan:   Post-operative Plan:   Informed Consent: I have reviewed the patients History and Physical, chart, labs and discussed the procedure including the risks, benefits and alternatives for the proposed anesthesia with the patient or authorized representative who has indicated his/her understanding and acceptance.     Dental advisory given  Plan Discussed with: CRNA, Anesthesiologist and Surgeon  Anesthesia Plan Comments:        Anesthesia Quick Evaluation

## 2022-12-21 NOTE — Progress Notes (Signed)
Physical Therapy Treatment Patient Details Name: Sarah Charles MRN: [id number] DOB: Sep 03, 1960 Today's Date: 12/21/2022   History of Present Illness Patient is a 62 year old female who was admitted with DKA, Sepsis, L LE wound, and cellulitis, N/V. Hx of DM, CAD, obesity, OSA, OA, GERD, depression    PT Comments  Pt agreeable with some encouragement. Improved performance compared to last session. Pt confirms she declines SNF and plans to return home. She could benefit from HHPT f/u if agreeable.     If plan is discharge home, recommend the following: A little help with walking and/or transfers;A little help with bathing/dressing/bathroom;Assistance with cooking/housework;Assist for transportation;Help with stairs or ramp for entrance   Can travel by private vehicle     Yes  Equipment Recommendations  None recommended by PT    Recommendations for Other Services OT consult     Precautions / Restrictions Precautions Precautions: Fall Precaution Comments: wound on RLE Restrictions Weight Bearing Restrictions: No     Mobility  Bed Mobility Overal bed mobility: Needs Assistance Bed Mobility: Supine to Sit     Supine to sit: Min guard, HOB elevated     General bed mobility comments: Increaesd time. Encouragement required. Pt relied on bedrail    Transfers Overall transfer level: Needs assistance Equipment used: Rolling walker (2 wheels) Transfers: Sit to/from Stand Sit to Stand: Min guard           General transfer comment: Min guard A. Increased time.Cues for safety, hand placement    Ambulation/Gait Ambulation/Gait assistance: Min guard Gait Distance (Feet): 60 Feet Assistive device: Rolling walker (2 wheels) Gait Pattern/deviations: Step-through pattern, Decreased stride length       General Gait Details: Intermittent A to steady. Cues for safety, RW proximity.   Stairs             Wheelchair Mobility     Tilt Bed    Modified Rankin (Stroke  Patients Only)       Balance Overall balance assessment: Needs assistance         Standing balance support: Bilateral upper extremity supported, Reliant on assistive device for balance, During functional activity Standing balance-Leahy Scale: Poor                              Cognition Arousal/Alertness: Awake/alert Behavior During Therapy: WFL for tasks assessed/performed, Flat affect Overall Cognitive Status: Within Functional Limits for tasks assessed                                 General Comments: requires some encouragement        Exercises      General Comments        Pertinent Vitals/Pain Pain Assessment Pain Assessment: Faces Faces Pain Scale: Hurts a little bit Pain Location: headache Pain Descriptors / Indicators: Discomfort Pain Intervention(s): Limited activity within patient's tolerance, Monitored during session, Patient requesting pain meds-RN notified    Home Living                          Prior Function            PT Goals (current goals can now be found in the care plan section) Progress towards PT goals: Progressing toward goals    Frequency    Min 1X/week      PT Plan Current plan  remains appropriate    Co-evaluation              AM-PAC PT "6 Clicks" Mobility   Outcome Measure  Help needed turning from your back to your side while in a flat bed without using bedrails?: A Little Help needed moving from lying on your back to sitting on the side of a flat bed without using bedrails?: A Little Help needed moving to and from a bed to a chair (including a wheelchair)?: A Little Help needed standing up from a chair using your arms (e.g., wheelchair or bedside chair)?: A Little Help needed to walk in hospital room?: A Little Help needed climbing 3-5 steps with a railing? : A Little 6 Click Score: 18    End of Session Equipment Utilized During Treatment: Gait belt Activity Tolerance:  Patient tolerated treatment well Patient left: in bed;with call bell/phone within reach;with bed alarm set   PT Visit Diagnosis: Muscle weakness (generalized) (M62.81);Difficulty in walking, not elsewhere classified (R26.2)     Time: 1030-1046 PT Time Calculation (min) (ACUTE ONLY): 16 min  Charges:    $Gait Training: 8-22 mins PT General Charges $$ ACUTE PT VISIT: 1 Visit                         Faye Ramsay, PT Acute Rehabilitation  Office: [phone]

## 2022-12-21 NOTE — Transfer of Care (Signed)
Immediate Anesthesia Transfer of Care Note  Patient: Elspeth Arensdorf  Procedure(s) Performed: ESOPHAGOGASTRODUODENOSCOPY (EGD) WITH PROPOFOL BIOPSY  Patient Location: PACU  Anesthesia Type:MAC  Level of Consciousness: awake and alert   Airway & Oxygen Therapy: Patient Spontanous Breathing and Patient connected to face mask oxygen  Post-op Assessment: Report given to RN and Post -op Vital signs reviewed and stable  Post vital signs: Reviewed and stable  Last Vitals:  Vitals Value Taken Time  BP    Temp    Pulse 69 12/21/22 1443  Resp 41 12/21/22 1443  SpO2 99 % 12/21/22 1443  Vitals shown include unfiled device data.  Last Pain:  Vitals:   12/21/22 1350  TempSrc: Temporal  PainSc: 0-No pain         Complications: No notable events documented.

## 2022-12-21 NOTE — Plan of Care (Signed)
°  Problem: Skin Integrity: Goal: Skin integrity will improve Outcome: Progressing   Problem: Education: Goal: Knowledge of General Education information will improve Description: Including pain rating scale, medication(s)/side effects and non-pharmacologic comfort measures Outcome: Progressing   Problem: Pain Managment: Goal: General experience of comfort will improve Outcome: Progressing   Problem: Safety: Goal: Ability to remain free from injury will improve Outcome: Progressing   Problem: Skin Integrity: Goal: Risk for impaired skin integrity will decrease Outcome: Progressing

## 2022-12-21 NOTE — Interval H&P Note (Signed)
History and Physical Interval Note: 62/female with dysphagia and odynophagia for EGD with possible dilation.  12/21/2022 1:50 PM  Sarah Charles  has presented today for EGD with possible dilation, with the diagnosis of dysphagia, odynophagia, acid reflux.  The various methods of treatment have been discussed with the patient and family. After consideration of risks, benefits and other options for treatment, the patient has consented to  Procedure(s): ESOPHAGOGASTRODUODENOSCOPY (EGD) WITH PROPOFOL (N/A) as a surgical intervention.  The patient's history has been reviewed, patient examined, no change in status, stable for surgery.  I have reviewed the patient's chart and labs.  Questions were answered to the patient's satisfaction.     Kerin Salen

## 2022-12-21 NOTE — Care Management Important Message (Signed)
Important Message  Patient Details IM Letter given. Name: Sarah Charles MRN: [id number] Date of Birth: [birth year]   Medicare Important Message Given:  Yes     Caren Macadam 12/21/2022, 10:11 AM

## 2022-12-22 DIAGNOSIS — E1110 Type 2 diabetes mellitus with ketoacidosis without coma: Secondary | ICD-10-CM | POA: Diagnosis not present

## 2022-12-22 LAB — GLUCOSE, CAPILLARY
Glucose-Capillary: 144 mg/dL — ABNORMAL HIGH (ref 70–99)
Glucose-Capillary: 74 mg/dL (ref 70–99)
Glucose-Capillary: 210 mg/dL — ABNORMAL HIGH (ref 70–99)
Glucose-Capillary: 81 mg/dL (ref 70–99)

## 2022-12-22 LAB — MAGNESIUM: Magnesium: 1.6 mg/dL — ABNORMAL LOW (ref 1.7–2.4)

## 2022-12-22 LAB — PHOSPHORUS: Phosphorus: 3.8 mg/dL (ref 2.5–4.6)

## 2022-12-22 MED ORDER — FLUOXETINE HCL 20 MG PO CAPS
20.0000 mg | ORAL_CAPSULE | Freq: Every morning | ORAL | Status: DC
  Administered 2022-12-22 – 2022-12-23 (×2): 20 mg via ORAL
  Filled 2022-12-22 (×2): qty 1

## 2022-12-22 MED ORDER — ALUM & MAG HYDROXIDE-SIMETH 200-200-20 MG/5ML PO SUSP
30.0000 mL | Freq: Once | ORAL | Status: DC
Start: 2022-12-22 — End: 2022-12-22

## 2022-12-22 MED ORDER — POTASSIUM CHLORIDE 10 MEQ/100ML IV SOLN
10.0000 meq | INTRAVENOUS | Status: AC
  Administered 2022-12-22 (×4): 10 meq via INTRAVENOUS
  Filled 2022-12-22 (×3): qty 100

## 2022-12-22 MED ORDER — ALUM & MAG HYDROXIDE-SIMETH 200-200-20 MG/5ML PO SUSP
30.0000 mL | Freq: Four times a day (QID) | ORAL | Status: DC | PRN
  Administered 2022-12-22: 30 mL via ORAL
  Filled 2022-12-22 (×2): qty 30

## 2022-12-22 MED ORDER — INSULIN GLARGINE-YFGN 100 UNIT/ML ~~LOC~~ SOLN
35.0000 [IU] | Freq: Every day | SUBCUTANEOUS | Status: DC
  Administered 2022-12-22 – 2022-12-23 (×2): 35 [IU] via SUBCUTANEOUS
  Filled 2022-12-22 (×2): qty 0.35

## 2022-12-22 MED ORDER — ROSUVASTATIN CALCIUM 20 MG PO TABS
40.0000 mg | ORAL_TABLET | Freq: Every evening | ORAL | Status: DC
  Administered 2022-12-22: 40 mg via ORAL
  Filled 2022-12-22: qty 2

## 2022-12-22 MED ORDER — LIDOCAINE VISCOUS HCL 2 % MT SOLN
15.0000 mL | Freq: Four times a day (QID) | OROMUCOSAL | Status: DC | PRN
  Administered 2022-12-22: 15 mL via ORAL
  Filled 2022-12-22 (×2): qty 15

## 2022-12-22 MED ORDER — POTASSIUM CHLORIDE 10 MEQ/100ML IV SOLN
10.0000 meq | INTRAVENOUS | Status: DC
  Filled 2022-12-22: qty 100

## 2022-12-22 MED ORDER — MAGNESIUM SULFATE 2 GM/50ML IV SOLN
2.0000 g | Freq: Once | INTRAVENOUS | Status: AC
  Administered 2022-12-22: 2 g via INTRAVENOUS
  Filled 2022-12-22: qty 50

## 2022-12-22 MED ORDER — POTASSIUM CHLORIDE 20 MEQ PO PACK
60.0000 meq | PACK | Freq: Once | ORAL | Status: AC
  Administered 2022-12-22: 60 meq via ORAL
  Filled 2022-12-22: qty 3

## 2022-12-22 MED ORDER — LIDOCAINE VISCOUS HCL 2 % MT SOLN: 15.0000 mL | Freq: Four times a day (QID) | OROMUCOSAL | Status: DC | PRN

## 2022-12-22 NOTE — Plan of Care (Signed)
  Problem: Clinical Measurements: Goal: Ability to avoid or minimize complications of infection will improve Outcome: Progressing   Problem: Skin Integrity: Goal: Skin integrity will improve Outcome: Progressing   Problem: Education: Goal: Knowledge of General Education information will improve Description: Including pain rating scale, medication(s)/side effects and non-pharmacologic comfort measures Outcome: Progressing   Problem: Health Behavior/Discharge Planning: Goal: Ability to manage health-related needs will improve Outcome: Progressing   
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
## 2022-12-22 NOTE — Anesthesia Postprocedure Evaluation (Signed)
Anesthesia Post Note  Patient: Sarah Charles  Procedure(s) Performed: ESOPHAGOGASTRODUODENOSCOPY (EGD) WITH PROPOFOL BIOPSY     Patient location during evaluation: Endoscopy Anesthesia Type: MAC Level of consciousness: awake and alert Pain management: pain level controlled Vital Signs Assessment: post-procedure vital signs reviewed and stable Respiratory status: spontaneous breathing, nonlabored ventilation, respiratory function stable and patient connected to nasal cannula oxygen Cardiovascular status: stable and blood pressure returned to baseline Postop Assessment: no apparent nausea or vomiting Anesthetic complications: no   No notable events documented.  Last Vitals:  Vitals:   12/22/22 0531 12/22/22 1346  BP: (!) 158/63 113/61  Pulse: 77 80  Resp: 19 18  Temp: 36.9 C 36.5 C  SpO2: 91% 97%    Last Pain:  Vitals:   12/22/22 1346  TempSrc: Oral  PainSc:                  Naseem Adler S

## 2022-12-22 NOTE — TOC Progression Note (Signed)
Transition of Care [facility]) - Progression Note    Patient Details  Name: Sarah Charles MRN: [id number] Date of Birth: February 16, 1961  [facility]) CM/SW Contact  Larrie Kass, LCSW Phone Number: 12/22/2022, 9:53 AM  Clinical Narrative:   [facility] accepted pt for HH PT,OT,RN, Aide and SW. Will need HH orders placed, MD made aware. TOC to follow.    Expected Discharge Plan: Home w Home Health Services Barriers to Discharge: Continued Medical Work up  Expected Discharge Plan and Services In-house Referral: Clinical Social Work     Living arrangements for the past 2 months: Mobile Home                                       Social Determinants of Health (SDOH) Interventions SDOH Screenings   Food Insecurity: No Food Insecurity (12/19/2022)  Housing: Low Risk  (12/19/2022)  Transportation Needs: No Transportation Needs (12/19/2022)  Utilities: Not At Risk (12/19/2022)  Alcohol Screen: Low Risk  (12/22/2020)  Depression (PHQ2-9): Low Risk  (01/22/2022)  Financial Resource Strain: Low Risk  (12/22/2020)  Physical Activity: Unknown (12/22/2020)  Social Connections: Socially Isolated (12/22/2020)  Stress: No Stress Concern Present (12/22/2020)  Tobacco Use: High Risk (12/17/2022)    Readmission Risk Interventions     No data to display

## 2022-12-22 NOTE — Plan of Care (Signed)
°  Problem: Clinical Measurements: Goal: Ability to avoid or minimize complications of infection will improve Outcome: Progressing   Problem: Skin Integrity: Goal: Skin integrity will improve Outcome: Progressing   Problem: Education: Goal: Knowledge of General Education information will improve Description: Including pain rating scale, medication(s)/side effects and non-pharmacologic comfort measures Outcome: Progressing   Problem: Activity: Goal: Risk for activity intolerance will decrease Outcome: Progressing

## 2022-12-22 NOTE — Progress Notes (Signed)
OT Cancellation Note  Patient Details Name: Sarah Charles MRN: [id number] DOB: 1960-05-29   Cancelled Treatment:    Reason Eval/Treat Not Completed: Other (comment) Patient just back to bed with nursing upon entrance to room. Patient asking for therapy to check back on 8/1. OT to continue to follow and check back as schedule will allow.  Rosalio Loud, MS Acute Rehabilitation Department Office# [phone]  12/22/2022, 3:06 PM

## 2022-12-23 ENCOUNTER — Encounter (HOSPITAL_COMMUNITY): Payer: Self-pay | Admitting: Gastroenterology

## 2022-12-23 DIAGNOSIS — E1110 Type 2 diabetes mellitus with ketoacidosis without coma: Secondary | ICD-10-CM | POA: Diagnosis not present

## 2022-12-23 LAB — GLUCOSE, CAPILLARY
Glucose-Capillary: 165 mg/dL — ABNORMAL HIGH (ref 70–99)
Glucose-Capillary: 138 mg/dL — ABNORMAL HIGH (ref 70–99)

## 2022-12-23 MED ORDER — PANTOPRAZOLE SODIUM 40 MG PO TBEC: 40.0000 mg | DELAYED_RELEASE_TABLET | Freq: Two times a day (BID) | ORAL | 0 refills | Status: DC

## 2022-12-23 MED ORDER — SUCRALFATE 1 G PO TABS: 1.0000 g | ORAL_TABLET | Freq: Four times a day (QID) | ORAL | 0 refills | Status: DC

## 2022-12-23 MED ORDER — PANTOPRAZOLE SODIUM 40 MG PO TBEC: 40.0000 mg | DELAYED_RELEASE_TABLET | Freq: Every day | ORAL | Status: DC

## 2022-12-23 MED ORDER — POTASSIUM CHLORIDE 20 MEQ PO PACK
40.0000 meq | PACK | Freq: Once | ORAL | Status: AC
  Administered 2022-12-23: 40 meq via ORAL
  Filled 2022-12-23: qty 2

## 2022-12-23 NOTE — TOC Transition Note (Signed)
Transition of Care [facility]) - CM/SW Discharge Note   Patient Details  Name: Sarah Charles MRN: [id number] Date of Birth: 1960-07-13  Transition of Care [facility]) CM/SW Contact:  Larrie Kass, LCSW Phone Number: 12/23/2022, 11:43 AM   Clinical Narrative:    CSW received a text from [facility] rep stating pt's insurance look like it has ended on their end. CSW spoke with pt she stated the Medicare plan she be active and the same. [facility] rep with provided additional information and will request auth again. [facility] will be following.   Pt reports she does not need transportation assistance, and will call her ride when she is ready for d/c. No further TOC needs TOC sign off.    Final next level of care: Home w Home Health Services Barriers to Discharge: No Barriers Identified   Patient Goals and CMS Choice CMS Medicare.gov Compare Post Acute Care list provided to:: Patient Choice offered to / list presented to : Patient  Discharge Placement                         Discharge Plan and Services Additional resources added to the After Visit Summary for   In-house Referral: Clinical Social Work                          [facility] Agency: [facility]        Social Determinants of Health (SDOH) Interventions SDOH Screenings   Food Insecurity: No Food Insecurity (12/19/2022)  Housing: Low Risk  (12/19/2022)  Transportation Needs: No Transportation Needs (12/19/2022)  Utilities: Not At Risk (12/19/2022)  Alcohol Screen: Low Risk  (12/22/2020)  Depression (PHQ2-9): Low Risk  (01/22/2022)  Financial Resource Strain: Low Risk  (12/22/2020)  Physical Activity: Unknown (12/22/2020)  Social Connections: Socially Isolated (12/22/2020)  Stress: No Stress Concern Present (12/22/2020)  Tobacco Use: High Risk (12/17/2022)     Readmission Risk Interventions     No data to display
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
## 2022-12-23 NOTE — Plan of Care (Signed)
  Problem: Clinical Measurements: Goal: Ability to avoid or minimize complications of infection will improve Outcome: Adequate for Discharge   Problem: Skin Integrity: Goal: Skin integrity will improve Outcome: Adequate for Discharge   Problem: Education: Goal: Knowledge of General Education information will improve Description: Including pain rating scale, medication(s)/side effects and non-pharmacologic comfort measures Outcome: Adequate for Discharge   Problem: Health Behavior/Discharge Planning: Goal: Ability to manage health-related needs will improve Outcome: Adequate for Discharge   Problem: Clinical Measurements: Goal: Ability to maintain clinical measurements within normal limits will improve Outcome: Adequate for Discharge Goal: Will remain free from infection Outcome: Adequate for Discharge Goal: Diagnostic test results will improve Outcome: Adequate for Discharge Goal: Respiratory complications will improve Outcome: Adequate for Discharge Goal: Cardiovascular complication will be avoided Outcome: Adequate for Discharge   Problem: Activity: Goal: Risk for activity intolerance will decrease Outcome: Adequate for Discharge   Problem: Nutrition: Goal: Adequate nutrition will be maintained Outcome: Adequate for Discharge   Problem: Coping: Goal: Level of anxiety will decrease Outcome: Adequate for Discharge   Problem: Elimination: Goal: Will not experience complications related to bowel motility Outcome: Adequate for Discharge Goal: Will not experience complications related to urinary retention Outcome: Adequate for Discharge   Problem: Pain Managment: Goal: General experience of comfort will improve Outcome: Adequate for Discharge   Problem: Safety: Goal: Ability to remain free from injury will improve Outcome: Adequate for Discharge   Problem: Skin Integrity: Goal: Risk for impaired skin integrity will decrease Outcome: Adequate for Discharge    Problem: Education: Goal: Ability to describe self-care measures that may prevent or decrease complications (Diabetes Survival Skills Education) will improve Outcome: Adequate for Discharge Goal: Individualized Educational Video(s) Outcome: Adequate for Discharge   Problem: Coping: Goal: Ability to adjust to condition or change in health will improve Outcome: Adequate for Discharge   Problem: Fluid Volume: Goal: Ability to maintain a balanced intake and output will improve Outcome: Adequate for Discharge   Problem: Health Behavior/Discharge Planning: Goal: Ability to identify and utilize available resources and services will improve Outcome: Adequate for Discharge Goal: Ability to manage health-related needs will improve Outcome: Adequate for Discharge   Problem: Metabolic: Goal: Ability to maintain appropriate glucose levels will improve Outcome: Adequate for Discharge   Problem: Nutritional: Goal: Maintenance of adequate nutrition will improve Outcome: Adequate for Discharge Goal: Progress toward achieving an optimal weight will improve Outcome: Adequate for Discharge   Problem: Skin Integrity: Goal: Risk for impaired skin integrity will decrease Outcome: Adequate for Discharge   Problem: Tissue Perfusion: Goal: Adequacy of tissue perfusion will improve Outcome: Adequate for Discharge

## 2022-12-23 NOTE — Final Progress Note (Signed)
Patient discharge explained to patient. Encouraged patient to follow-up with PCP after discharge. Explained medication regimen to patient. Awaiting discharge transportation for patient. Awaiting for TOC to go over Home Health insurance coverage with patient

## 2022-12-24 ENCOUNTER — Telehealth: Payer: Self-pay

## 2022-12-24 ENCOUNTER — Ambulatory Visit: Payer: Self-pay

## 2022-12-24 ENCOUNTER — Telehealth: Payer: Self-pay | Admitting: Family Medicine

## 2022-12-24 DIAGNOSIS — M79604 Pain in right leg: Secondary | ICD-10-CM
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
## 2022-12-24 NOTE — Telephone Encounter (Signed)
Referral Request - Has patient seen PCP for this complaint? no *If NO, is insurance requiring patient see PCP for this issue before PCP can refer them? Referral for which specialty: rehab for pain Preferred provider/office: ? Reason for referral: pain in leg

## 2022-12-24 NOTE — Telephone Encounter (Signed)
Pt is calling in because she says she was supposed to have someone from rehab picking her up to take her to a facility. Pt says she did not recall the name of the facility, but that she was supposed to go. Pt says she had been working with a Sports coach but wasn't sure of her exact name. Please follow up with pt.

## 2022-12-24 NOTE — Transitions of Care (Post Inpatient/ED Visit) (Signed)
   12/24/2022  Name: Sarah Charles MRN: [id number] DOB: 1961-05-07  Today's TOC FU Call Status: Today's TOC FU Call Status:: Unsuccessful Call (1st Attempt) Unsuccessful Call (1st Attempt) Date: 12/24/22  Attempted to reach the patient regarding the most recent Inpatient/ED visit.  Follow Up Plan: Additional outreach attempts will be made to reach the patient to complete the Transitions of Care (Post Inpatient/ED visit) call.      Antionette Fairy, RN,BSN,CCM [facility] Health/[facility]N Care Management Care Management Community Coordinator Direct Phone: [phone] Toll Free: [phone] Fax: [phone]

## 2022-12-24 NOTE — Transitions of Care (Post Inpatient/ED Visit) (Signed)
   12/24/2022  Name: Idaliz Tinkle MRN: [id number] DOB: 20-Oct-1960  Today's TOC FU Call Status: Today's TOC FU Call Status:: Unsuccessful Call (2nd Attempt) Unsuccessful Call (2nd Attempt) Date: 12/24/22 (RN CM received voicemail message from pt-returning call as well as teams message from Pt Engagement Agent(Santiya) that pt was trying to return RN call. Return call to pt-and no answer.)  Attempted to reach the patient regarding the most recent Inpatient/ED visit.  Follow Up Plan: Additional outreach attempts will be made to reach the patient to complete the Transitions of Care (Post Inpatient/ED visit) call.     Antionette Fairy, RN,BSN,CCM [facility] Health/[facility]N Care Management Care Management Community Coordinator Direct Phone: [phone] Toll Free: [phone] Fax: [phone]

## 2022-12-27 ENCOUNTER — Telehealth: Payer: Self-pay

## 2022-12-27 ENCOUNTER — Ambulatory Visit: Payer: Self-pay
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
## 2022-12-27 NOTE — Patient Instructions (Signed)
Visit Information  Thank you for taking time to visit with me today. Please don't hesitate to contact me if I can be of assistance to you.   Following are the goals we discussed today:  - Go to [employer] to request help with housing   Our next appointment is by telephone on 8/7 at 10:00  Please call the care guide team at [phone] if you need to cancel or reschedule your appointment.   If you are experiencing a Mental Health or Behavioral Health Crisis or need someone to talk to, please call the Suicide and Crisis Lifeline: 988 call 1-800-273-TALK (toll free, 24 hour hotline) go to [facility] [address] [place] [phone])  The patient verbalized understanding of instructions, educational materials, and care plan provided today and DECLINED offer to receive copy of patient instructions, educational materials, and care plan.   Bevelyn Ngo, BSW, CDP Social Worker, Certified Dementia Practitioner [facility] Care Management  Care Coordination [phone]
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
## 2022-12-29 ENCOUNTER — Telehealth: Payer: Self-pay

## 2022-12-29 NOTE — Patient Outreach (Signed)
  Care Coordination   12/29/2022 Name: Sarah Charles MRN: [id number] DOB: 12/30/1960   Care Coordination Outreach Attempts:  An unsuccessful telephone outreach was attempted for a scheduled appointment today.  Follow Up Plan:  Additional outreach attempts will be made to offer the patient care coordination information and services.   Encounter Outcome:  No Answer   Care Coordination Interventions:  No, not indicated    Bevelyn Ngo, BSW, CDP Social Worker, Certified Dementia Practitioner [facility] Care Management  Care Coordination [phone]

## 2022-12-29 NOTE — Patient Outreach (Signed)
error 

## 2022-12-29 NOTE — Telephone Encounter (Signed)
Copied from CRM [id number]. Topic: General - Other >> Dec 29, 2022 11:35 AM Phill Myron wrote: Delay of Care: Unable to reach patient.  So we are rescheduling here visit to January 12, 2023 and again try to start care for this patient.  Please call  to let us know if you are able to reach [name]

## 2022-12-30 ENCOUNTER — Other Ambulatory Visit: Payer: Self-pay | Admitting: Family Medicine

## 2022-12-30 NOTE — Telephone Encounter (Signed)
NOTED

## 2022-12-31 ENCOUNTER — Ambulatory Visit: Payer: Self-pay

## 2022-12-31 ENCOUNTER — Telehealth: Payer: Self-pay

## 2022-12-31 NOTE — Telephone Encounter (Signed)
Done

## 2022-12-31 NOTE — Patient Outreach (Signed)
  Care Coordination   Follow Up Visit Note   12/31/2022 Name: Verner Tewari MRN: [id number] DOB: 06-10-1960  Teneshia Marik is a 62 y.o. year old female who sees [name], MD for primary care. I spoke with  [name] by phone today.  What matters to the patients health and wellness today?  Identify permanent housing options.    Goals Addressed             This Visit's Progress    Care Coordination Activities       Care Coordination Interventions: Discussed the patient did visit the [employer] and received a list of housing options. Patient reports the options she has been given all have a waiting list Assessed if patient would like SW to contact her daughter to see if there is anything that could be done to allow patient to stay in the home - patient declined this as an option Determined the patient does not have alternative options for housing with a family member or friend. Patient states she will stay in the home until she finds a different option Reminded patient of upcomming appointment with RN Care Manager        SDOH assessments and interventions completed:  No     Care Coordination Interventions:  Yes, provided   Interventions Today    Flowsheet Row Most Recent Value  Chronic Disease   Chronic disease during today's visit Diabetes  General Interventions   General Interventions Discussed/Reviewed General Interventions Reviewed        Follow up plan:  SW will continue to follow    Encounter Outcome:  Pt. Visit Completed   Bevelyn Ngo, Kenard Gower, CDP Social Worker, Certified Dementia Practitioner [facility] Care Management  Care Coordination [phone]

## 2022-12-31 NOTE — Patient Instructions (Signed)
Visit Information  Thank you for taking time to visit with me today. Please don't hesitate to contact me if I can be of assistance to you.   Following are the goals we discussed today:  - Engage with RN Care Manager during your future scheduled call  Our next appointment is by telephone on 8/20 at 2:45  Please call the care guide team at [phone] if you need to cancel or reschedule your appointment.   If you are experiencing a Mental Health or Behavioral Health Crisis or need someone to talk to, please call 1-800-273-TALK (toll free, 24 hour hotline) go to [facility] [address], [place] [phone]) call 911  The patient verbalized understanding of instructions, educational materials, and care plan provided today and DECLINED offer to receive copy of patient instructions, educational materials, and care plan.   Bevelyn Ngo, BSW, CDP Social Worker, Certified Dementia Practitioner [facility] Care Management  Care Coordination [phone]

## 2022-12-31 NOTE — Telephone Encounter (Signed)
Noted will be on the look out for the paperwork.

## 2022-12-31 NOTE — Telephone Encounter (Signed)
Copied from CRM [id number]. Topic: General - Other >> Dec 31, 2022 12:15 PM Dominique A wrote: Reason for CRM: Louise with [employer] states that she is going to refax over paper work for the pt freestyle libre 3. The original fax that she received is missing information needed. Per Sallye Ober she is needing the diagnosis and diagnosis code for diabetes for the pt, and also needing the treatment plan and history of pt present illness. Pt is also needing the last office visit date and notes.  Fax number: [phone]

## 2023-01-03 ENCOUNTER — Ambulatory Visit: Payer: Self-pay
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
## 2023-01-03 NOTE — Telephone Encounter (Signed)
Noted  

## 2023-01-03 NOTE — Telephone Encounter (Addendum)
Spoke with patient . Verified name & DOB   Advised that referral for rehab for pain was Placed to [facility]  [address] [place]  [address] Ph# [phone]

## 2023-01-07 ENCOUNTER — Ambulatory Visit: Payer: Self-pay

## 2023-01-10 ENCOUNTER — Other Ambulatory Visit: Payer: Self-pay | Admitting: Family Medicine

## 2023-01-10 DIAGNOSIS — E785 Hyperlipidemia, unspecified: Secondary | ICD-10-CM

## 2023-01-10 DIAGNOSIS — Z794 Long term (current) use of insulin: Secondary | ICD-10-CM

## 2023-01-10 DIAGNOSIS — I2510 Atherosclerotic heart disease of native coronary artery without angina pectoris: Secondary | ICD-10-CM

## 2023-01-10 DIAGNOSIS — T3111 Burns involving 10-19% of body surface with 10-19% third degree burns: Secondary | ICD-10-CM | POA: Diagnosis not present

## 2023-01-10 DIAGNOSIS — I739 Peripheral vascular disease, unspecified: Secondary | ICD-10-CM
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
## 2023-01-11 ENCOUNTER — Telehealth: Payer: Self-pay

## 2023-01-11 NOTE — Patient Outreach (Signed)
  Care Coordination   01/11/2023 Name: Sarah Charles MRN: [id number] DOB: 11/15/1960   Care Coordination Outreach Attempts:  An unsuccessful telephone outreach was attempted for a scheduled appointment today.  Follow Up Plan:  Additional outreach attempts will be made to offer the patient care coordination information and services.   Encounter Outcome:  No Answer   Care Coordination Interventions:  No, not indicated    Bevelyn Ngo, BSW, CDP Social Worker, Certified Dementia Practitioner [facility] Care Management  Care Coordination [phone]

## 2023-01-17 ENCOUNTER — Inpatient Hospital Stay: Payer: Medicare HMO | Admitting: Family Medicine

## 2023-01-17 ENCOUNTER — Telehealth: Payer: Self-pay

## 2023-01-17 NOTE — Patient Outreach (Signed)
  Care Coordination   01/17/2023 Name: Sarah Charles MRN: [id number] DOB: [birth year]   Care Coordination Outreach Attempts:  A second unsuccessful outreach was attempted today to offer the patient with information about available care coordination services.  Follow Up Plan:  Additional outreach attempts will be made to offer the patient care coordination information and services.   Encounter Outcome:  No Answer   Care Coordination Interventions:  No, not indicated    Bevelyn Ngo, BSW, CDP Social Worker, Certified Dementia Practitioner [facility] Care Management  Care Coordination [phone]

## 2023-01-19 ENCOUNTER — Ambulatory Visit: Payer: Self-pay

## 2023-01-19 ENCOUNTER — Ambulatory Visit: Payer: Medicare HMO | Attending: Family Medicine
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
## 2023-01-19 NOTE — Patient Instructions (Signed)
 Visit Information  Thank you for taking time to visit with me today. Please don't hesitate to contact me if I can be of assistance to you.   Following are the goals we discussed today:  - Contact your primary care provider as needed  If you are experiencing a Mental Health or Behavioral Health Crisis or need someone to talk to, please call 1-800-273-TALK (toll free, 24 hour hotline) go to [facility] [address] [place] [phone]) call 911  The patient verbalized understanding of instructions, educational materials, and care plan provided today and DECLINED offer to receive copy of patient instructions, educational materials, and care plan.   No further follow up required: Please contact me as needed  Bevelyn Ngo, BSW, CDP Social Worker, Certified Dementia Practitioner [facility] Care Management  Care Coordination [phone]

## 2023-01-27 ENCOUNTER — Telehealth: Payer: Self-pay | Admitting: Family Medicine

## 2023-01-27 NOTE — Telephone Encounter (Signed)
Spoke with patient Advised that I do not see that this office ordered anything for her. Advised patient against giving anyone or personal information/ Patient voiced understanding of all discussed .

## 2023-01-27 NOTE — Telephone Encounter (Signed)
Pt is calling to report that she received a message about receiving a new glucometer and she did not request one. Please advise CB- [phone]

## 2023-01-28 ENCOUNTER — Other Ambulatory Visit: Payer: Self-pay | Admitting: Family Medicine

## 2023-01-28 DIAGNOSIS — E1169 Type 2 diabetes mellitus with other specified complication: Secondary | ICD-10-CM

## 2023-01-28 DIAGNOSIS — E1142 Type 2 diabetes mellitus with diabetic polyneuropathy: Secondary | ICD-10-CM

## 2023-01-28 DIAGNOSIS — E1159 Type 2 diabetes mellitus with other circulatory complications: Secondary | ICD-10-CM

## 2023-01-28 DIAGNOSIS — I2510 Atherosclerotic heart disease of native coronary artery without angina pectoris: Secondary | ICD-10-CM

## 2023-01-28 DIAGNOSIS — F32A Depression, unspecified: Secondary | ICD-10-CM

## 2023-01-28 DIAGNOSIS — Z794 Long term (current) use of insulin: Secondary | ICD-10-CM

## 2023-01-28 DIAGNOSIS — I739 Peripheral vascular disease, unspecified: Secondary | ICD-10-CM

## 2023-01-28 MED ORDER — LANTUS SOLOSTAR 100 UNIT/ML ~~LOC~~ SOPN: 55.0000 [IU] | PEN_INJECTOR | Freq: Every day | SUBCUTANEOUS | 6 refills | Status: DC

## 2023-01-28 MED ORDER — METFORMIN HCL 500 MG PO TABS
1000.0000 mg | ORAL_TABLET | Freq: Two times a day (BID) | ORAL | 0 refills | Status: DC
Start: 2023-01-28 — End: 2023-05-16

## 2023-01-28 MED ORDER — ROSUVASTATIN CALCIUM 40 MG PO TABS: 40.0000 mg | ORAL_TABLET | Freq: Every evening | ORAL | 1 refills | Status: DC

## 2023-01-28 MED ORDER — FLUOXETINE HCL 20 MG PO CAPS: ORAL_CAPSULE | ORAL | 0 refills | Status: DC

## 2023-01-28 MED ORDER — METOPROLOL SUCCINATE ER 25 MG PO TB24
25.0000 mg | ORAL_TABLET | Freq: Every day | ORAL | 0 refills | Status: AC
Start: 2023-01-28 — End: ?

## 2023-01-28 MED ORDER — LISINOPRIL 40 MG PO TABS: 40.0000 mg | ORAL_TABLET | Freq: Every day | ORAL | 0 refills | Status: DC

## 2023-01-28 MED ORDER — GABAPENTIN 300 MG PO CAPS: 600.0000 mg | ORAL_CAPSULE | Freq: Two times a day (BID) | ORAL | 2 refills | Status: DC

## 2023-01-28 MED ORDER — BUPROPION HCL ER (SR) 150 MG PO TB12
150.0000 mg | ORAL_TABLET | Freq: Two times a day (BID) | ORAL | 0 refills | Status: DC
Start: 2023-01-28 — End: 2023-05-16

## 2023-01-28 MED ORDER — TRAZODONE HCL 50 MG PO TABS: ORAL_TABLET | ORAL | 0 refills | Status: DC

## 2023-01-28 MED ORDER — FENOFIBRATE 145 MG PO TABS
145.0000 mg | ORAL_TABLET | Freq: Every evening | ORAL | 0 refills | Status: DC
Start: 2023-01-28 — End: 2023-05-16

## 2023-01-28 NOTE — Telephone Encounter (Signed)
Change in pharmacy- Rx forwarded to mail order pharmacy- appointment has been scheduled with provider Requested Prescriptions  Pending Prescriptions Disp Refills   buPROPion (WELLBUTRIN SR) 150 MG 12 hr tablet 180 tablet 0    Sig: Take 1 tablet (150 mg total) by mouth 2 (two) times daily.     Psychiatry: Antidepressants - bupropion Failed - 01/28/2023  9:57 AM      Failed - AST in normal range and within 360 days    AST  Date Value Ref Range Status  12/18/2022 11 (L) 15 - 41 U/L Final         Failed - Completed PHQ-2 or PHQ-9 in the last 360 days      Failed - Last BP in normal range    BP Readings from Last 1 Encounters:  12/23/22 (!) 159/77         Passed - Cr in normal range and within 360 days    Creat  Date Value Ref Range Status  07/23/2016 0.50 0.50 - 1.05 mg/dL Final    Comment:      For patients > or = 62 years of age: The upper reference limit for Creatinine is approximately 13% higher for people identified as African-American.      Creatinine, Ser  Date Value Ref Range Status  12/23/2022 0.54 0.44 - 1.00 mg/dL Final   Creatinine, POC  Date Value Ref Range Status  09/16/2016 100 mg/dL Final   Creatinine, Urine  Date Value Ref Range Status  07/23/2016 47 20 - 320 mg/dL Final         Passed - ALT in normal range and within 360 days    ALT  Date Value Ref Range Status  12/18/2022 11 0 - 44 U/L Final         Passed - Valid encounter within last 6 months    Recent Outpatient Visits           2 months ago Type 2 diabetes mellitus with other specified complication, with long-term current use of insulin (HCC)   [facility] [place], Odette Horns, MD   7 months ago Acute cough   [place] [facility] [name], MD   7 months ago Vaginal candidiasis   [place] [facility] [place], [place], MD   11 months ago Type 2 diabetes mellitus with other specified complication, with  long-term current use of insulin (HCC)   [place] [facility] [place], [place], MD   1 year ago Type 2 diabetes mellitus with other specified complication, with long-term current use of insulin [facility])   [place] [facility] [place], Odette Horns, MD       Future Appointments             In 1 month [name], MD [facility]             fenofibrate (TRICOR) 145 MG tablet 90 tablet 0    Sig: Take 1 tablet (145 mg total) by mouth every evening.     Cardiovascular:  Antilipid - Fibric Acid Derivatives Failed - 01/28/2023  9:57 AM      Failed - AST in normal range and within 360 days    AST  Date Value Ref Range Status  12/18/2022 11 (L) 15 - 41 U/L Final         Failed - Lipid Panel in normal range  within the last 12 months    Cholesterol, Total  Date Value Ref Range Status  11/12/2021 137 100 - 199 mg/dL Final   LDL Chol Calc (NIH)  Date Value Ref Range Status  11/12/2021 64 0 - 99 mg/dL Final   HDL  Date Value Ref Range Status  11/12/2021 54 >39 mg/dL Final   Triglycerides  Date Value Ref Range Status  11/12/2021 102 0 - 149 mg/dL Final         Passed - ALT in normal range and within 360 days    ALT  Date Value Ref Range Status  12/18/2022 11 0 - 44 U/L Final         Passed - Cr in normal range and within 360 days    Creat  Date Value Ref Range Status  07/23/2016 0.50 0.50 - 1.05 mg/dL Final    Comment:      For patients > or = 62 years of age: The upper reference limit for Creatinine is approximately 13% higher for people identified as African-American.      Creatinine, Ser  Date Value Ref Range Status  12/23/2022 0.54 0.44 - 1.00 mg/dL Final   Creatinine, POC  Date Value Ref Range Status  09/16/2016 100 mg/dL Final   Creatinine, Urine  Date Value Ref Range Status  07/23/2016 47 20 - 320 mg/dL Final         Passed - HGB in normal range and within 360 days     Hemoglobin  Date Value Ref Range Status  12/23/2022 12.1 12.0 - 15.0 g/dL Final  16/02/9603 54.0 11.1 - 15.9 g/dL Final         Passed - HCT in normal range and within 360 days    HCT  Date Value Ref Range Status  12/23/2022 38.5 36.0 - 46.0 % Final   Hematocrit  Date Value Ref Range Status  11/09/2018 42.8 34.0 - 46.6 % Final         Passed - PLT in normal range and within 360 days    Platelets  Date Value Ref Range Status  12/23/2022 187 150 - 400 K/uL Final  11/09/2018 321 150 - 450 x10E3/uL Final         Passed - WBC in normal range and within 360 days    WBC  Date Value Ref Range Status  12/23/2022 6.8 4.0 - 10.5 K/uL Final         Passed - eGFR is 30 or above and within 360 days    GFR, Est African American  Date Value Ref Range Status  07/23/2016 >89 >=60 mL/min Final   GFR calc Af Amer  Date Value Ref Range Status  12/10/2019 >60 >60 mL/min Final   GFR, Est Non African American  Date Value Ref Range Status  07/23/2016 >89 >=60 mL/min Final   GFR, Estimated  Date Value Ref Range Status  12/23/2022 >60 >60 mL/min Final    Comment:    (NOTE) Calculated using the CKD-EPI Creatinine Equation (2021)    eGFR  Date Value Ref Range Status  02/25/2022 97 >59 mL/min/1.73 Final         Passed - Valid encounter within last 12 months    Recent Outpatient Visits           2 months ago Type 2 diabetes mellitus with other specified complication, with long-term current use of insulin (HCC)   [facility] [name], MD   7  months ago Acute cough   [facility] [name], MD   7 months ago Vaginal candidiasis   [place] [facility] [place], [place], MD   11 months ago Type 2 diabetes mellitus with other specified complication, with long-term current use of insulin (HCC)   [place] [facility] [place], Odette Horns, MD   1 year ago Type 2  diabetes mellitus with other specified complication, with long-term current use of insulin (HCC)   [place] [facility] [name], MD       Future Appointments             In 1 month [place], Odette Horns, MD [place] [facility]             FLUoxetine (PROZAC) 20 MG capsule 90 capsule 0    Sig: TAKE ONE CAPSULE BY MOUTH EVERY MORNING     Psychiatry:  Antidepressants - SSRI Failed - 01/28/2023  9:57 AM      Failed - Completed PHQ-2 or PHQ-9 in the last 360 days      Passed - Valid encounter within last 6 months    Recent Outpatient Visits           2 months ago Type 2 diabetes mellitus with other specified complication, with long-term current use of insulin (HCC)   [place] [facility] [name], MD   7 months ago Acute cough   [place] [facility] [name], MD   7 months ago Vaginal candidiasis   [place] [facility] [place], [place], MD   11 months ago Type 2 diabetes mellitus with other specified complication, with long-term current use of insulin (HCC)   [place] [facility] [place], [place], MD   1 year ago Type 2 diabetes mellitus with other specified complication, with long-term current use of insulin (HCC)   [place] [facility] [place], Odette Horns, MD       Future Appointments             In 1 month [place], [place], MD [facility]             traZODone (DESYREL) 50 MG tablet 90 tablet 0    Sig: TAKE ONE TABLET BY MOUTH EVERYDAY AT BEDTIME     Psychiatry: Antidepressants - Serotonin Modulator Failed - 01/28/2023  9:57 AM      Failed - Completed PHQ-2 or PHQ-9 in the last 360 days      Passed - Valid encounter within last 6 months    Recent Outpatient Visits           2 months ago Type 2 diabetes mellitus with other specified  complication, with long-term current use of insulin (HCC)   [place] [facility] [name], MD   7 months ago Acute cough   [place] [facility] [name], MD   7 months ago Vaginal candidiasis    [facility] [place], [place], MD   11 months ago Type 2 diabetes mellitus with other specified complication, with long-term current use of insulin [facility])    [facility], Odette Horns, MD   1 year ago Type 2 diabetes mellitus with other specified complication, with long-term current use of  insulin [facility])   [place] [facility] [place], [place], MD       Future Appointments             In 1 month [name], MD [facility]             metFORMIN (GLUCOPHAGE) 500 MG tablet 360 tablet 0    Sig: Take 2 tablets (1,000 mg total) by mouth 2 (two) times daily. TAKE TWO TABLETS BY MOUTH TWICE DAILY WITH A MEAL Strength: 500 mg     Endocrinology:  Diabetes - Biguanides Failed - 01/28/2023  9:57 AM      Failed - HBA1C is between 0 and 7.9 and within 180 days    HbA1c, POC (controlled diabetic range)  Date Value Ref Range Status  11/23/2022 9.5 (A) 0.0 - 7.0 % Final         Failed - B12 Level in normal range and within 720 days    Vitamin B-12  Date Value Ref Range Status  03/23/2018 827 232 - 1,245 pg/mL Final         Passed - Cr in normal range and within 360 days    Creat  Date Value Ref Range Status  07/23/2016 0.50 0.50 - 1.05 mg/dL Final    Comment:      For patients > or = 63 years of age: The upper reference limit for Creatinine is approximately 13% higher for people identified as African-American.      Creatinine, Ser  Date Value Ref Range Status  12/23/2022 0.54 0.44 - 1.00 mg/dL Final   Creatinine, POC  Date Value Ref Range Status  09/16/2016 100 mg/dL Final   Creatinine,  Urine  Date Value Ref Range Status  07/23/2016 47 20 - 320 mg/dL Final         Passed - eGFR in normal range and within 360 days    GFR, Est African American  Date Value Ref Range Status  07/23/2016 >89 >=60 mL/min Final   GFR calc Af Amer  Date Value Ref Range Status  12/10/2019 >60 >60 mL/min Final   GFR, Est Non African American  Date Value Ref Range Status  07/23/2016 >89 >=60 mL/min Final   GFR, Estimated  Date Value Ref Range Status  12/23/2022 >60 >60 mL/min Final    Comment:    (NOTE) Calculated using the CKD-EPI Creatinine Equation (2021)    eGFR  Date Value Ref Range Status  02/25/2022 97 >59 mL/min/1.73 Final         Passed - Valid encounter within last 6 months    Recent Outpatient Visits           2 months ago Type 2 diabetes mellitus with other specified complication, with long-term current use of insulin (HCC)   [place] [facility] [name], MD   7 months ago Acute cough   [place] [facility] [name], MD   7 months ago Vaginal candidiasis    [facility] [place], Odette Horns, MD   11 months ago Type 2 diabetes mellitus with other specified complication, with long-term current use of insulin [facility])    [facility] [place], Odette Horns, MD   1 year ago Type 2 diabetes mellitus with other specified complication, with long-term current use of insulin [facility])    [facility] [name], MD  Future Appointments             In 1 month [name], MD [facility]            Passed - CBC within normal limits and completed in the last 12 months    WBC  Date Value Ref Range Status  12/23/2022 6.8 4.0 - 10.5 K/uL Final   RBC  Date Value Ref Range Status  12/23/2022 4.06 3.87 - 5.11 MIL/uL Final   Hemoglobin  Date Value Ref Range Status   12/23/2022 12.1 12.0 - 15.0 g/dL Final  16/02/9603 54.0 11.1 - 15.9 g/dL Final   HCT  Date Value Ref Range Status  12/23/2022 38.5 36.0 - 46.0 % Final   Hematocrit  Date Value Ref Range Status  11/09/2018 42.8 34.0 - 46.6 % Final   MCHC  Date Value Ref Range Status  12/23/2022 31.4 30.0 - 36.0 g/dL Final   [facility]  Date Value Ref Range Status  12/23/2022 29.8 26.0 - 34.0 pg Final   MCV  Date Value Ref Range Status  12/23/2022 94.8 80.0 - 100.0 fL Final  11/09/2018 93 79 - 97 fL Final   No results found for: "PLTCOUNTKUC", "LABPLAT", "POCPLA" RDW  Date Value Ref Range Status  12/23/2022 14.2 11.5 - 15.5 % Final  11/09/2018 12.5 11.7 - 15.4 % Final          metoprolol succinate (TOPROL-XL) 25 MG 24 hr tablet 90 tablet 0    Sig: Take 1 tablet (25 mg total) by mouth daily. TAKE ONE TABLET BY MOUTH EVERY MORNING WITH OR immediately following A meal Strength: 25 mg     Cardiovascular:  Beta Blockers Failed - 01/28/2023  9:57 AM      Failed - Last BP in normal range    BP Readings from Last 1 Encounters:  12/23/22 (!) 159/77         Passed - Last Heart Rate in normal range    Pulse Readings from Last 1 Encounters:  12/23/22 64         Passed - Valid encounter within last 6 months    Recent Outpatient Visits           2 months ago Type 2 diabetes mellitus with other specified complication, with long-term current use of insulin (HCC)   [place] [facility] [name], MD   7 months ago Acute cough   [place] [facility] [name], MD   7 months ago Vaginal candidiasis   [place] [facility] [place], [place], MD   11 months ago Type 2 diabetes mellitus with other specified complication, with long-term current use of insulin (HCC)   [place] [facility] [place], [place], MD   1 year ago Type 2 diabetes mellitus with other specified complication, with long-term  current use of insulin (HCC)   [place] [facility] [place], Odette Horns, MD       Future Appointments             In 1 month [name], MD [place] [facility]             rosuvastatin (CRESTOR) 40 MG tablet 90 tablet 1    Sig: Take 1 tablet (40 mg total) by mouth every evening.     Cardiovascular:  Antilipid - Statins 2 Failed - 01/28/2023  9:57  AM      Failed - Lipid Panel in normal range within the last 12 months    Cholesterol, Total  Date Value Ref Range Status  11/12/2021 137 100 - 199 mg/dL Final   LDL Chol Calc (NIH)  Date Value Ref Range Status  11/12/2021 64 0 - 99 mg/dL Final   HDL  Date Value Ref Range Status  11/12/2021 54 >39 mg/dL Final   Triglycerides  Date Value Ref Range Status  11/12/2021 102 0 - 149 mg/dL Final         Passed - Cr in normal range and within 360 days    Creat  Date Value Ref Range Status  07/23/2016 0.50 0.50 - 1.05 mg/dL Final    Comment:      For patients > or = 62 years of age: The upper reference limit for Creatinine is approximately 13% higher for people identified as African-American.      Creatinine, Ser  Date Value Ref Range Status  12/23/2022 0.54 0.44 - 1.00 mg/dL Final   Creatinine, POC  Date Value Ref Range Status  09/16/2016 100 mg/dL Final   Creatinine, Urine  Date Value Ref Range Status  07/23/2016 47 20 - 320 mg/dL Final         Passed - Patient is not pregnant      Passed - Valid encounter within last 12 months    Recent Outpatient Visits           2 months ago Type 2 diabetes mellitus with other specified complication, with long-term current use of insulin (HCC)   [place] [facility] [place], Odette Horns, MD   7 months ago Acute cough   [place] [facility] [name], MD   7 months ago Vaginal candidiasis   [place] [facility] [place], [place], MD   11 months  ago Type 2 diabetes mellitus with other specified complication, with long-term current use of insulin (HCC)   [place] [facility] [place], [place], MD   1 year ago Type 2 diabetes mellitus with other specified complication, with long-term current use of insulin (HCC)   [place] [facility] Center [place], Odette Horns, MD       Future Appointments             In 1 month [name], MD [facility]             gabapentin (NEURONTIN) 300 MG capsule 360 capsule 2    Sig: Take 2 capsules (600 mg total) by mouth 2 (two) times daily.     Neurology: Anticonvulsants - gabapentin Failed - 01/28/2023  9:57 AM      Failed - Completed PHQ-2 or PHQ-9 in the last 360 days      Passed - Cr in normal range and within 360 days    Creat  Date Value Ref Range Status  07/23/2016 0.50 0.50 - 1.05 mg/dL Final    Comment:      For patients > or = 62 years of age: The upper reference limit for Creatinine is approximately 13% higher for people identified as African-American.      Creatinine, Ser  Date Value Ref Range Status  12/23/2022 0.54 0.44 - 1.00 mg/dL Final   Creatinine, POC  Date Value Ref Range Status  09/16/2016 100 mg/dL Final   Creatinine, Urine  Date Value Ref Range Status  07/23/2016 47 20 - 320 mg/dL Final         Passed - Valid encounter within last 12 months    Recent Outpatient Visits           2 months ago Type 2 diabetes mellitus with other specified complication, with long-term current use of insulin (HCC)   [facility] [name], MD   7 months ago Acute cough   [place] [facility] [name], MD   7 months ago Vaginal candidiasis   [place] [facility] [place], Odette Horns, MD   11 months ago Type 2 diabetes mellitus with other specified complication, with long-term current use of insulin (HCC)    [place] [facility] [place], Odette Horns, MD   1 year ago Type 2 diabetes mellitus with other specified complication, with long-term current use of insulin [facility])   [place] [facility] [place], Odette Horns, MD       Future Appointments             In 1 month [name], MD [place] [facility]             insulin glargine (LANTUS SOLOSTAR) 100 UNIT/ML Solostar Pen 45 mL 6    Sig: Inject 55 Units into the skin at bedtime.     Endocrinology:  Diabetes - Insulins Failed - 01/28/2023  9:57 AM      Failed - HBA1C is between 0 and 7.9 and within 180 days    HbA1c, POC (controlled diabetic range)  Date Value Ref Range Status  11/23/2022 9.5 (A) 0.0 - 7.0 % Final         Passed - Valid encounter within last 6 months    Recent Outpatient Visits           2 months ago Type 2 diabetes mellitus with other specified complication, with long-term current use of insulin (HCC)   [place] [facility] [name], MD   7 months ago Acute cough   [place] [facility] [name], MD   7 months ago Vaginal candidiasis   [place] [facility] [place], [place], MD   11 months ago Type 2 diabetes mellitus with other specified complication, with long-term current use of insulin (HCC)   [place] [facility] [place], [place], MD   1 year ago Type 2 diabetes mellitus with other specified complication, with long-term current use of insulin [facility])   [place] [facility] [place], Odette Horns, MD       Future Appointments             In 1 month [name], MD [place] [facility]             lisinopril (ZESTRIL) 40 MG tablet 90 tablet 0    Sig: Take 1 tablet (40 mg total) by mouth daily.     Cardiovascular:  ACE Inhibitors Failed - 01/28/2023  9:57 AM       Failed - Last BP in normal range    BP Readings from Last 1 Encounters:  12/23/22 (!) 159/77         Passed - Cr in normal range and within 180 days    Creat  Date Value Ref Range Status  07/23/2016 0.50 0.50 - 1.05 mg/dL  Final    Comment:      For patients > or = 62 years of age: The upper reference limit for Creatinine is approximately 13% higher for people identified as African-American.      Creatinine, Ser  Date Value Ref Range Status  12/23/2022 0.54 0.44 - 1.00 mg/dL Final   Creatinine, POC  Date Value Ref Range Status  09/16/2016 100 mg/dL Final   Creatinine, Urine  Date Value Ref Range Status  07/23/2016 47 20 - 320 mg/dL Final         Passed - K in normal range and within 180 days    Potassium  Date Value Ref Range Status  12/23/2022 3.5 3.5 - 5.1 mmol/L Final         Passed - Patient is not pregnant      Passed - Valid encounter within last 6 months    Recent Outpatient Visits           2 months ago Type 2 diabetes mellitus with other specified complication, with long-term current use of insulin (HCC)   [place] [facility] [name], MD   7 months ago Acute cough   [place] [facility] [name], MD   7 months ago Vaginal candidiasis   [place] [facility] [place], [place], MD   11 months ago Type 2 diabetes mellitus with other specified complication, with long-term current use of insulin (HCC)   [place] [facility] [place], Odette Horns, MD   1 year ago Type 2 diabetes mellitus with other specified complication, with long-term current use of insulin [facility])    [facility] [name], MD       Future Appointments             In 1 month [name], MD [facility]

## 2023-01-28 NOTE — Telephone Encounter (Signed)
Medication Refill - Medication: buPROPion (WELLBUTRIN SR) 150 MG 12 hr tablet  fenofibrate (TRICOR) 145 MG tablet  FLUoxetine (PROZAC) 20 MG capsule  traZODone (DESYREL) 50 MG tablet  metFORMIN (GLUCOPHAGE) 500 MG tablet  metoprolol succinate (TOPROL-XL) 25 MG 24 hr tablet  rosuvastatin (CRESTOR) 40 MG tablet  gabapentin (NEURONTIN) 300 MG capsule  insulin glargine (LANTUS SOLOSTAR) 100 UNIT/ML Solostar Pen [[id number]]  lisinopril (ZESTRIL) 40 MG tablet  Has the patient contacted their pharmacy? Yes.   (Agent: If no, request that the patient contact the pharmacy for the refill. If patient does not wish to contact the pharmacy document the reason why and proceed with request.) (Agent: If yes, when and what did the pharmacy advise?)  Preferred Pharmacy (with phone number or street name):  [facility] - [place], [place] - [address]  [address] [place] [address]  Phone: [phone] Fax: [phone]   Has the patient been seen for an appointment in the last year OR does the patient have an upcoming appointment? Yes.    Agent: Please be advised that RX refills may take up to 3 business days. We ask that you follow-up with your pharmacy.

## 2023-02-01 ENCOUNTER — Ambulatory Visit: Payer: 59 | Attending: Family Medicine

## 2023-02-01 VITALS — Ht 63.0 in | Wt 168.0 lb

## 2023-02-01 DIAGNOSIS — Z0000 Encounter for general adult medical examination without abnormal findings: Secondary | ICD-10-CM

## 2023-02-01 NOTE — Patient Instructions (Signed)
Ms. Cockerill , Thank you for taking time to come for your Medicare Wellness Visit. I appreciate your ongoing commitment to your health goals. Please review the following plan we discussed and let me know if I can assist you in the future.   Referrals/Orders/Follow-Ups/Clinician Recommendations: Aim for 30 minutes of exercise or brisk walking, 6-8 glasses of water, and 5 servings of fruits and vegetables each day.  This is a list of the screening recommended for you and due dates:  Health Maintenance  Topic Date Due   Screening for Lung Cancer  10/31/2018   Eye exam for diabetics  06/29/2022   Yearly kidney health urinalysis for diabetes  11/13/2022   Flu Shot  12/23/2022   COVID-19 Vaccine (5 - 2023-24 season) 01/23/2023   Complete foot exam   02/26/2023   Hemoglobin A1C  05/26/2023   Yearly kidney function blood test for diabetes  12/23/2023   Pap Smear  12/25/2023   Medicare Annual Wellness Visit  02/01/2024   Mammogram  07/19/2024   Cologuard (Stool DNA test)  06/15/2025   DTaP/Tdap/Td vaccine (3 - Td or Tdap) 08/17/2026   Hepatitis C Screening  Completed   HIV Screening  Completed   Zoster (Shingles) Vaccine  Completed   HPV Vaccine  Aged Out    Advanced directives: (Provided) Advance directive discussed with you today. I have provided a copy for you to complete at home and have notarized. Once this is complete, please bring a copy in to our office so we can scan it into your chart.   Next Medicare Annual Wellness Visit scheduled for next year: Yes

## 2023-02-01 NOTE — Progress Notes (Signed)
Subjective:   Sarah Charles is a 62 y.o. female who presents for Medicare Annual (Subsequent) preventive examination.  Visit Complete: Virtual  I connected with  Sarah Charles on 02/01/23 by a audio enabled telemedicine application and verified that I am speaking with the correct person using two identifiers.  Patient Location: Home  Provider Location: Home Office  I discussed the limitations of evaluation and management by telemedicine. The patient expressed understanding and agreed to proceed.  Vital Signs: Because this visit was a virtual/telehealth visit, some criteria may be missing or patient reported. Any vitals not documented were not able to be obtained and vitals that have been documented are patient reported.   Review of Systems     Cardiac Risk Factors include: hypertension;diabetes mellitus;dyslipidemia;smoking/ tobacco exposure     Objective:    Today's Vitals   02/01/23 1040  Weight: 168 lb (76.2 kg)  Height: 5\' 3"  (1.6 m)   Body mass index is 29.76 kg/m.     02/01/2023    4:28 PM 12/17/2022   12:21 PM 06/14/2022    7:43 PM 01/22/2022    2:46 PM 11/26/2020    8:26 PM 11/13/2020   12:27 PM 10/15/2020    3:04 PM  Advanced Directives  Does Patient Have a Medical Advance Directive? No No No No No No No  Would patient like information on creating a medical advance directive? Yes (MAU/Ambulatory/Procedural Areas - Information given) No - Patient declined No - Guardian declined Yes (ED - Information included in AVS) No - Patient declined  No - Patient declined    Current Medications (verified) Outpatient Encounter Medications as of 02/01/2023  Medication Sig   aspirin EC 81 MG tablet Take 81 mg by mouth daily.   Blood Glucose Monitoring Suppl (ONETOUCH VERIO) w/Device KIT Use to test blood sugar 3 times a day   buPROPion (WELLBUTRIN SR) 150 MG 12 hr tablet Take 1 tablet (150 mg total) by mouth 2 (two) times daily.   cetirizine (ZYRTEC) 10 MG tablet TAKE 1 TABLET BY  MOUTH EVERY DAY (Patient taking differently: Take 10 mg by mouth daily.)   clotrimazole (LOTRIMIN) 1 % cream APPLY TO THE AFFECTED AREA(S) beneath BOTH breasts TWICE DAILY (Patient taking differently: Apply 1 Application topically 2 (two) times daily.)   diclofenac Sodium (VOLTAREN) 1 % GEL Apply 4 g topically 4 (four) times daily.   dicyclomine (BENTYL) 10 MG capsule Take 1 capsule (10 mg total) by mouth daily.   fenofibrate (TRICOR) 145 MG tablet Take 1 tablet (145 mg total) by mouth every evening.   FLUoxetine (PROZAC) 20 MG capsule TAKE ONE CAPSULE BY MOUTH EVERY MORNING   furosemide (LASIX) 20 MG tablet Take 1 tablet (20 mg total) by mouth daily.   gabapentin (NEURONTIN) 300 MG capsule Take 2 capsules (600 mg total) by mouth 2 (two) times daily.   glucose blood (ONETOUCH VERIO) test strip Use as instructed   hydrOXYzine (ATARAX) 25 MG tablet Take 25 mg by mouth 3 (three) times daily.   insulin glargine (LANTUS SOLOSTAR) 100 UNIT/ML Solostar Pen Inject 55 Units into the skin at bedtime.   Insulin Pen Needle (COMFORT EZ PEN NEEDLES) 31G X 5 MM MISC USE AS DIRECTED ONCE DAILY   Lancets (ONETOUCH DELICA PLUS LANCET33G) MISC Use to test blood sugar 3 times a day   Lancets Misc. (ACCU-CHEK FASTCLIX LANCET) KIT USE AS DIRECTED   lidocaine (LIDODERM) 5 % Place ONE PATCH onto THE SKIN daily. REMOVE & Discard PATCH WITHIN 12  hours OR as directed by MD   lisinopril (ZESTRIL) 40 MG tablet Take 1 tablet (40 mg total) by mouth daily.   metFORMIN (GLUCOPHAGE) 500 MG tablet Take 2 tablets (1,000 mg total) by mouth 2 (two) times daily. TAKE TWO TABLETS BY MOUTH TWICE DAILY WITH A MEAL Strength: 500 mg   methocarbamol (ROBAXIN) 500 MG tablet Take 500 mg by mouth every 8 (eight) hours as needed (for back pain).   metoCLOPramide (REGLAN) 10 MG tablet TAKE 1 TABLET BY MOUTH EVERY EIGHT HOURS AS NEEDED FOR NAUSEA OR VOMITING. (Patient taking differently: Take 10 mg by mouth every 8 (eight) hours as needed for  vomiting or nausea.)   metoprolol succinate (TOPROL-XL) 25 MG 24 hr tablet Take 1 tablet (25 mg total) by mouth daily. TAKE ONE TABLET BY MOUTH EVERY MORNING WITH OR immediately following A meal Strength: 25 mg   Multiple Vitamin (MULTIVITAMIN) tablet Take 1 tablet by mouth daily.   mupirocin ointment (BACTROBAN) 2 % Apply 1 Application topically 2 (two) times daily. To affected area till better   nitroGLYCERIN (NITROSTAT) 0.4 MG SL tablet Place 0.4 mg under the tongue every 5 (five) minutes as needed for chest pain.   oxyCODONE (OXY IR/ROXICODONE) 5 MG immediate release tablet Take 5 mg by mouth every 4 (four) hours as needed for moderate pain.   oxyCODONE-acetaminophen (PERCOCET/ROXICET) 5-325 MG tablet Take 1 tablet by mouth every 6 (six) hours as needed for severe pain.   pantoprazole (PROTONIX) 40 MG tablet Take 1 tablet (40 mg total) by mouth 2 (two) times daily.   pantoprazole (PROTONIX) 40 MG tablet Take 1 tablet (40 mg total) by mouth daily.   PRALUENT 75 MG/ML SOAJ INJECT 75 MG into THE SKIN every 14 DAYS (Patient taking differently: Inject 75 mg as directed every 14 (fourteen) days.)   rosuvastatin (CRESTOR) 40 MG tablet Take 1 tablet (40 mg total) by mouth every evening.   Semaglutide, 1 MG/DOSE, 4 MG/3ML SOPN Inject 1 mg as directed once a week. For 4 weeks then increase to 2mg  weekly thereafter (Patient taking differently: Inject 1 mg as directed once a week.)   Semaglutide, 2 MG/DOSE, 8 MG/3ML SOPN Inject 2 mg as directed once a week.   silver sulfADIAZINE (SILVADENE) 1 % cream Apply 1 Application topically daily.   terbinafine (LAMISIL) 250 MG tablet Please take one a day x 7days, repeat every 4 weeks x 4 months   traZODone (DESYREL) 50 MG tablet TAKE ONE TABLET BY MOUTH EVERYDAY AT BEDTIME   triamcinolone cream (KENALOG) 0.1 % APPLY TO THE AFFECTED AREA(S) ON BOTH LEGS TWICE DAILY (Patient taking differently: Apply 1 Application topically See admin instructions. APPLY TO THE  AFFECTED AREA(S) ON BOTH LEGS TWICE DAILY)   vitamin B-12 (CYANOCOBALAMIN) 500 MCG tablet TAKE ONE TABLET BY MOUTH ONCE DAILY   vitamin C (ASCORBIC ACID) 500 MG tablet Take 500 mg by mouth daily.   [DISCONTINUED] predniSONE (DELTASONE) 20 MG tablet Take 1 tablet (20 mg total) by mouth daily with breakfast.   sucralfate (CARAFATE) 1 g tablet Take 1 tablet (1 g total) by mouth 4 (four) times daily.   No facility-administered encounter medications on file as of 02/01/2023.    Allergies (verified) Cymbalta [duloxetine hcl]   History: Past Medical History:  Diagnosis Date   Arthritis    Coronary artery calcification seen on CT scan    a. 07/2017 noted on CT abd.   Depression    GERD (gastroesophageal reflux disease)    Hyperlipidemia  Hypertension    Obesity    Sleep apnea    a. Does not use CPAP "cause i dont think I need it anymore".   Tobacco abuse    Type II diabetes mellitus [facility])    Past Surgical History:  Procedure Laterality Date   BIOPSY  12/21/2022   Procedure: BIOPSY;  Surgeon: Kerin Salen, MD;  Location: [facility];  Service: Gastroenterology;;   CHOLECYSTECTOMY     CORONARY PRESSURE/FFR STUDY N/A 11/04/2017   Procedure: INTRAVASCULAR PRESSURE WIRE/FFR STUDY;  Surgeon: Swaziland, Peter M, MD;  Location[facility] INVASIVE CV LAB;  Service: Cardiovascular;  Laterality: N/A;   CORONARY STENT INTERVENTION N/A 11/04/2017   Procedure: CORONARY STENT INTERVENTION;  Surgeon: Swaziland, Peter M, MD;  Location: [facility] INVASIVE CV LAB;  Service: Cardiovascular;  Laterality: N/A;   ESOPHAGOGASTRODUODENOSCOPY (EGD) WITH PROPOFOL N/A 12/21/2022   Procedure: ESOPHAGOGASTRODUODENOSCOPY (EGD) WITH PROPOFOL;  Surgeon: Kerin Salen, MD;  Location: [facility] ENDOSCOPY;  Service: Gastroenterology;  Laterality: N/A;   INCISION AND DRAINAGE ABSCESS Left 12/22/2012   Procedure: INCISION AND DRAINAGE ABSCESS;  Surgeon: Dalia Heading, MD;  Location: [facility];  Service: General;  Laterality: Left;   LEFT HEART CATH AND  CORONARY ANGIOGRAPHY N/A 11/04/2017   Procedure: LEFT HEART CATH AND CORONARY ANGIOGRAPHY;  Surgeon: Swaziland, Peter M, MD;  Location: [facility] INVASIVE CV LAB;  Service: Cardiovascular;  Laterality: N/A;   TONSILLECTOMY     TUBAL LIGATION     Family History  Problem Relation Age of Onset   Hypertension Mother    CVA Mother    COPD Mother    Heart disease Mother    Kidney Stones Mother    Heart attack Mother        MI in late 20's   Breast cancer Mother    CAD Father    Hypercholesterolemia Father    Heart attack Father        Died @ 83 of MI   Diabetes Sister    Cancer Maternal Uncle    Diabetes Maternal Grandmother    Cancer Maternal Grandfather        lung cancer   Colon cancer Neg Hx    Esophageal cancer Neg Hx    Stomach cancer Neg Hx    Pancreatic cancer Neg Hx    Social History   Socioeconomic History   Marital status: Widowed    Spouse name: Not on file   Number of children: Not on file   Years of education: Not on file   Highest education level: Not on file  Occupational History   Not on file  Tobacco Use   Smoking status: Every Day    Current packs/day: 0.00    Average packs/day: 1 pack/day for 41.0 years (41.0 ttl pk-yrs)    Types: Cigarettes    Start date: 10/06/1977    Last attempt to quit: 10/07/2018    Years since quitting: 4.3   Smokeless tobacco: Never  Vaping Use   Vaping status: Never Used  Substance and Sexual Activity   Alcohol use: No   Drug use: No   Sexual activity: Never    Birth control/protection: None  Other Topics Concern   Not on file  Social History Narrative   Lives in [place] with husband.  Unemployed.  Does not routinely exercise.   Social Determinants of Health   Financial Resource Strain: Low Risk  (02/01/2023)   Overall Financial Resource Strain (CARDIA)    Difficulty of Paying Living Expenses: Not hard at all  Food Insecurity: No Food Insecurity (02/01/2023)   Hunger Vital Sign    Worried About Running Out of Food in the  Last Year: Never true    Ran Out of Food in the Last Year: Never true  Transportation Needs: No Transportation Needs (02/01/2023)   PRAPARE - Administrator, Civil Service (Medical): No    Lack of Transportation (Non-Medical): No  Physical Activity: Insufficiently Active (02/01/2023)   Exercise Vital Sign    Days of Exercise per Week: 2 days    Minutes of Exercise per Session: 20 min  Stress: No Stress Concern Present (02/01/2023)   [employer] of Occupational Health - Occupational Stress Questionnaire    Feeling of Stress : Not at all  Social Connections: Socially Isolated (02/01/2023)   Social Connection and Isolation Panel [NHANES]    Frequency of Communication with Friends and Family: More than three times a week    Frequency of Social Gatherings with Friends and Family: Three times a week    Attends Religious Services: Never    Active Member of Clubs or Organizations: No    Attends Banker Meetings: Never    Marital Status: Widowed    Tobacco Counseling Ready to quit: Not Answered Counseling given: Not Answered   Clinical Intake:  Pre-visit preparation completed: Yes  Pain : No/denies pain     Diabetes: No  How often do you need to have someone help you when you read instructions, pamphlets, or other written materials from your doctor or pharmacy?: 1 - Never  Interpreter Needed?: No  Information entered by :: Kandis Fantasia LPN   Activities of Daily Living    02/01/2023    4:28 PM 12/19/2022    7:00 PM  In your present state of health, do you have any difficulty performing the following activities:  Hearing? 0 0  Vision? 0 0  Difficulty concentrating or making decisions? 0 1  Walking or climbing stairs? 0 1  Dressing or bathing? 0 1  Doing errands, shopping? 0 1  Preparing Food and eating ? N   Using the Toilet? N   In the past six months, have you accidently leaked urine? N   Do you have problems with loss of bowel control? N    Managing your Medications? N   Managing your Finances? N   Housekeeping or managing your Housekeeping? N     Patient Care Team: [name], MD as PCP - General (Family Medicine) Meriam Sprague, MD (Inactive) as PCP - Cardiology (Cardiology)  Indicate any recent Medical Services you may have received from other than [facility] providers in the past year (date may be approximate).     Assessment:   This is a routine wellness examination for [place].  Hearing/Vision screen Hearing Screening - Comments:: Denies hearing difficulties   Vision Screening - Comments:: No vision problems; will schedule routine eye exam soon     Goals Addressed             This Visit's Progress    COMPLETED: Manage My Medicine       Timeframe:  Long-Range Goal Priority:  High Start Date:                             Expected End Date:                       Follow Up Date Monthly   -  call for medicine refill 2 or 3 days before it runs out    Why is this important?   These steps will help you keep on track with your medicines.   Notes: Patient paid on 1st and 3rd of month. Can't afford meds if refilled later in the month. Verbalized consent for [facility] to get all meds delivered on 3rd of month  Verbal consent obtained for [facility] enhanced pharmacy services (medication synchronization, adherence packaging, delivery coordination). A medication sync plan was created to allow patient to get all medications delivered once every 30 to 90 days per patient preference. Patient understands they have freedom to choose pharmacy and clinical pharmacist will coordinate care between all prescribers and [facility].       Depression Screen    02/01/2023    4:25 PM 11/23/2022    3:26 PM 01/22/2022    2:46 PM 10/08/2021    9:20 AM 08/14/2020   11:03 AM 12/31/2019    2:21 PM 08/20/2019   11:07 AM  PHQ 2/9 Scores  PHQ - 2 Score 0  0  0  3  PHQ- 9 Score     0  10  Exception Documentation   Patient refusal  Patient refusal  Patient refusal     Fall Risk    02/01/2023    4:27 PM 11/23/2022    3:26 PM 02/25/2022    9:37 AM 01/22/2022    2:46 PM 11/12/2021    9:17 AM  Fall Risk   Falls in the past year? 0 0 0 0 0  Number falls in past yr: 0 0 0 0 0  Injury with Fall? 0 0 0 0 0  Risk for fall due to : No Fall Risks No Fall Risks No Fall Risks No Fall Risks   Follow up Falls prevention discussed;Education provided;Falls evaluation completed        MEDICARE RISK AT HOME: Medicare Risk at Home Any stairs in or around the home?: No If so, are there any without handrails?: No Home free of loose throw rugs in walkways, pet beds, electrical cords, etc?: Yes Adequate lighting in your home to reduce risk of falls?: Yes Life alert?: No Use of a cane, walker or w/c?: No Grab bars in the bathroom?: Yes Shower chair or bench in shower?: No Elevated toilet seat or a handicapped toilet?: No  TIMED UP AND GO:  Was the test performed?  No    Cognitive Function:    01/22/2022    2:48 PM  MMSE - Mini Mental State Exam  Orientation to time 5  Orientation to Place 5  Registration 3  Attention/ Calculation 5  Recall 3  Language- name 2 objects 2  Language- repeat 1  Language- follow 3 step command 3  Language- read & follow direction 1  Write a sentence 1  Copy design 1  Total score 30        02/01/2023    4:28 PM 01/22/2022    2:49 PM  6CIT Screen  What Year? 0 points 0 points  What month? 0 points 0 points  What time? 0 points 0 points  Count back from 20 0 points 0 points  Months in reverse 0 points 0 points  Repeat phrase 0 points 0 points  Total Score 0 points 0 points    Immunizations Immunization History  Administered Date(s) Administered   Influenza Inj Mdck Quad Pf 01/24/2020   Influenza Split 02/13/2015   Influenza,inj,Quad PF,6+ Mos 02/15/2018, 02/25/2022  Influenza-Unspecified 02/24/2017, 04/23/2021   PFIZER(Purple Top)SARS-COV-2 Vaccination 09/14/2019,  10/11/2019, 05/27/2020   PNEUMOCOCCAL CONJUGATE-20 02/25/2022   Pfizer Covid-19 Vaccine Bivalent Booster 69yrs & up 03/18/2021   Pneumococcal Conjugate-13 08/16/2016   Pneumococcal Polysaccharide-23 09/16/2016   Td 02/21/2001   Tdap 08/16/2016   Zoster Recombinant(Shingrix) 08/20/2019, 08/31/2021    TDAP status: Up to date  Flu Vaccine status: Due, Education has been provided regarding the importance of this vaccine. Advised may receive this vaccine at local pharmacy or Health Dept. Aware to provide a copy of the vaccination record if obtained from local pharmacy or Health Dept. Verbalized acceptance and understanding.  Pneumococcal vaccine status: Up to date  Covid-19 vaccine status: Information provided on how to obtain vaccines.   Qualifies for Shingles Vaccine? Yes   Zostavax completed No   Shingrix Completed?: Yes  Screening Tests Health Maintenance  Topic Date Due   Lung Cancer Screening  10/31/2018   OPHTHALMOLOGY EXAM  06/29/2022   Diabetic kidney evaluation - Urine ACR  11/13/2022   INFLUENZA VACCINE  12/23/2022   COVID-19 Vaccine (5 - 2023-24 season) 01/23/2023   FOOT EXAM  02/26/2023   HEMOGLOBIN A1C  05/26/2023   Diabetic kidney evaluation - eGFR measurement  12/23/2023   PAP SMEAR-Modifier  12/25/2023   Medicare Annual Wellness (AWV)  02/01/2024   MAMMOGRAM  07/19/2024   Fecal DNA (Cologuard)  06/15/2025   DTaP/Tdap/Td (3 - Td or Tdap) 08/17/2026   Hepatitis C Screening  Completed   HIV Screening  Completed   Zoster Vaccines- Shingrix  Completed   HPV VACCINES  Aged Out    Health Maintenance  Health Maintenance Due  Topic Date Due   Lung Cancer Screening  10/31/2018   OPHTHALMOLOGY EXAM  06/29/2022   Diabetic kidney evaluation - Urine ACR  11/13/2022   INFLUENZA VACCINE  12/23/2022   COVID-19 Vaccine (5 - 2023-24 season) 01/23/2023    Colorectal cancer screening: Type of screening: Cologuard. Completed 06/15/22. Repeat every 3 years  Mammogram  status: Completed 07/19/22. Repeat every year  Lung Cancer Screening: (Low Dose CT Chest recommended if Age 43-80 years, 20 pack-year currently smoking OR have quit w/in 15years.) does not qualify.   Lung Cancer Screening Referral: n/a  Additional Screening:  Hepatitis C Screening: does qualify; Completed 09/16/16  Vision Screening: Recommended annual ophthalmology exams for early detection of glaucoma and other disorders of the eye. Is the patient up to date with their annual eye exam?  No  Who is the provider or what is the name of the office in which the patient attends annual eye exams? none If pt is not established with a provider, would they like to be referred to a provider to establish care? No .   Dental Screening: Recommended annual dental exams for proper oral hygiene  Diabetic Foot Exam: Diabetic Foot Exam: Completed 02/25/22  Community Resource Referral / Chronic Care Management: CRR required this visit?  No   CCM required this visit?  No     Plan:     I have personally reviewed and noted the following in the patient's chart:   Medical and social history Use of alcohol, tobacco or illicit drugs  Current medications and supplements including opioid prescriptions. Patient is not currently taking opioid prescriptions. Functional ability and status Nutritional status Physical activity Advanced directives List of other physicians Hospitalizations, surgeries, and ER visits in previous 12 months Vitals Screenings to include cognitive, depression, and falls Referrals and appointments  In addition, I have reviewed and  discussed with patient certain preventive protocols, quality metrics, and best practice recommendations. A written personalized care plan for preventive services as well as general preventive health recommendations were provided to patient.     Kandis Fantasia [place], [place]   1/32/4401   After Visit Summary: (Mail) Due to this being a telephonic  visit, the after visit summary with patients personalized plan was offered to patient via mail   Nurse Notes: Patient is asking for assistance in obtaining transportation to next office visit on 03/15/23

## 2023-02-03 ENCOUNTER — Other Ambulatory Visit: Payer: Self-pay

## 2023-02-03 ENCOUNTER — Inpatient Hospital Stay (HOSPITAL_COMMUNITY)
Admission: EM | Admit: 2023-02-03 | Discharge: 2023-02-06 | DRG: 391 | Disposition: A | Discharge status: Home / Self Care | Payer: Medicare PPO | Attending: Family Medicine | Admitting: Family Medicine

## 2023-02-03 ENCOUNTER — Emergency Department (HOSPITAL_COMMUNITY): Payer: Medicare PPO

## 2023-02-03 ENCOUNTER — Encounter (HOSPITAL_COMMUNITY): Payer: Self-pay

## 2023-02-03 DIAGNOSIS — F17210 Nicotine dependence, cigarettes, uncomplicated: Secondary | ICD-10-CM | POA: Diagnosis present

## 2023-02-03 DIAGNOSIS — Z23 Encounter for immunization: Secondary | ICD-10-CM | POA: Diagnosis not present

## 2023-02-03 DIAGNOSIS — Z8249 Family history of ischemic heart disease and other diseases of the circulatory system: Secondary | ICD-10-CM

## 2023-02-03 DIAGNOSIS — R933 Abnormal findings on diagnostic imaging of other parts of digestive tract: Secondary | ICD-10-CM

## 2023-02-03 DIAGNOSIS — F32A Depression, unspecified: Secondary | ICD-10-CM | POA: Diagnosis present

## 2023-02-03 DIAGNOSIS — R197 Diarrhea, unspecified: Secondary | ICD-10-CM | POA: Diagnosis not present

## 2023-02-03 DIAGNOSIS — K625 Hemorrhage of anus and rectum: Secondary | ICD-10-CM

## 2023-02-03 DIAGNOSIS — A09 Infectious gastroenteritis and colitis, unspecified: Secondary | ICD-10-CM | POA: Diagnosis present

## 2023-02-03 DIAGNOSIS — Z79899 Other long term (current) drug therapy: Secondary | ICD-10-CM

## 2023-02-03 DIAGNOSIS — E612 Magnesium deficiency: Secondary | ICD-10-CM | POA: Diagnosis present

## 2023-02-03 DIAGNOSIS — K5731 Diverticulosis of large intestine without perforation or abscess with bleeding: Secondary | ICD-10-CM | POA: Diagnosis present

## 2023-02-03 DIAGNOSIS — D62 Acute posthemorrhagic anemia: Secondary | ICD-10-CM

## 2023-02-03 DIAGNOSIS — K529 Noninfective gastroenteritis and colitis, unspecified: Secondary | ICD-10-CM | POA: Diagnosis not present

## 2023-02-03 DIAGNOSIS — G4730 Sleep apnea, unspecified: Secondary | ICD-10-CM | POA: Diagnosis present

## 2023-02-03 DIAGNOSIS — K5100 Ulcerative (chronic) pancolitis without complications: Secondary | ICD-10-CM | POA: Diagnosis present

## 2023-02-03 DIAGNOSIS — Z825 Family history of asthma and other chronic lower respiratory diseases: Secondary | ICD-10-CM

## 2023-02-03 DIAGNOSIS — E1165 Type 2 diabetes mellitus with hyperglycemia: Secondary | ICD-10-CM | POA: Diagnosis not present

## 2023-02-03 DIAGNOSIS — Z794 Long term (current) use of insulin: Secondary | ICD-10-CM | POA: Diagnosis not present

## 2023-02-03 DIAGNOSIS — Z823 Family history of stroke: Secondary | ICD-10-CM

## 2023-02-03 DIAGNOSIS — Z833 Family history of diabetes mellitus: Secondary | ICD-10-CM

## 2023-02-03 DIAGNOSIS — K51011 Ulcerative (chronic) pancolitis with rectal bleeding: Secondary | ICD-10-CM | POA: Diagnosis not present

## 2023-02-03 DIAGNOSIS — Z7984 Long term (current) use of oral hypoglycemic drugs: Secondary | ICD-10-CM | POA: Diagnosis not present

## 2023-02-03 DIAGNOSIS — E785 Hyperlipidemia, unspecified: Secondary | ICD-10-CM | POA: Diagnosis present

## 2023-02-03 DIAGNOSIS — Z8719 Personal history of other diseases of the digestive system: Secondary | ICD-10-CM

## 2023-02-03 DIAGNOSIS — E876 Hypokalemia: Secondary | ICD-10-CM | POA: Diagnosis not present

## 2023-02-03 DIAGNOSIS — Z122 Encounter for screening for malignant neoplasm of respiratory organs: Secondary | ICD-10-CM

## 2023-02-03 DIAGNOSIS — Z888 Allergy status to other drugs, medicaments and biological substances status: Secondary | ICD-10-CM

## 2023-02-03 DIAGNOSIS — K219 Gastro-esophageal reflux disease without esophagitis: Secondary | ICD-10-CM | POA: Diagnosis present

## 2023-02-03 DIAGNOSIS — E1110 Type 2 diabetes mellitus with ketoacidosis without coma: Secondary | ICD-10-CM | POA: Diagnosis present

## 2023-02-03 DIAGNOSIS — I10 Essential (primary) hypertension: Secondary | ICD-10-CM | POA: Diagnosis present

## 2023-02-03 DIAGNOSIS — Z955 Presence of coronary angioplasty implant and graft: Secondary | ICD-10-CM | POA: Diagnosis not present

## 2023-02-03 DIAGNOSIS — I2510 Atherosclerotic heart disease of native coronary artery without angina pectoris: Secondary | ICD-10-CM | POA: Diagnosis present

## 2023-02-03 DIAGNOSIS — Z83438 Family history of other disorder of lipoprotein metabolism and other lipidemia: Secondary | ICD-10-CM

## 2023-02-03 DIAGNOSIS — Z7982 Long term (current) use of aspirin: Secondary | ICD-10-CM | POA: Diagnosis not present

## 2023-02-03 DIAGNOSIS — K2100 Gastro-esophageal reflux disease with esophagitis, without bleeding: Secondary | ICD-10-CM | POA: Diagnosis present

## 2023-02-03 LAB — CBG MONITORING, ED
Glucose-Capillary: 269 mg/dL — ABNORMAL HIGH (ref 70–99)
Glucose-Capillary: 255 mg/dL — ABNORMAL HIGH (ref 70–99)

## 2023-02-03 LAB — CBC WITH DIFFERENTIAL/PLATELET
Abs Immature Granulocytes: 0.06 10*3/uL (ref 0.00–0.07)
Basophils Absolute: 0 10*3/uL (ref 0.0–0.1)
Basophils Relative: 0 %
Eosinophils Absolute: 0 10*3/uL (ref 0.0–0.5)
Eosinophils Relative: 0 %
HCT: 46.8 % — ABNORMAL HIGH (ref 36.0–46.0)
Hemoglobin: 15.6 g/dL — ABNORMAL HIGH (ref 12.0–15.0)
Immature Granulocytes: 1 %
Lymphocytes Relative: 7 %
Lymphs Abs: 1 10*3/uL (ref 0.7–4.0)
MCH: 30.1 pg (ref 26.0–34.0)
MCHC: 33.3 g/dL (ref 30.0–36.0)
MCV: 90.2 fL (ref 80.0–100.0)
Monocytes Absolute: 0.6 10*3/uL (ref 0.1–1.0)
Monocytes Relative: 5 %
Neutro Abs: 11.3 10*3/uL — ABNORMAL HIGH (ref 1.7–7.7)
Neutrophils Relative %: 87 %
Platelets: 259 10*3/uL (ref 150–400)
RBC: 5.19 MIL/uL — ABNORMAL HIGH (ref 3.87–5.11)
RDW: 13.4 % (ref 11.5–15.5)
WBC: 13 10*3/uL — ABNORMAL HIGH (ref 4.0–10.5)
nRBC: 0 % (ref 0.0–0.2)

## 2023-02-03 LAB — C DIFFICILE QUICK SCREEN W PCR REFLEX
C Diff antigen: NEGATIVE
C Diff interpretation: NOT DETECTED
C Diff toxin: NEGATIVE

## 2023-02-03 LAB — COMPREHENSIVE METABOLIC PANEL
ALT: 15 U/L (ref 0–44)
AST: 15 U/L (ref 15–41)
Albumin: 3.8 g/dL (ref 3.5–5.0)
Alkaline Phosphatase: 129 U/L — ABNORMAL HIGH (ref 38–126)
Anion gap: 11 (ref 5–15)
BUN: 26 mg/dL — ABNORMAL HIGH (ref 8–23)
CO2: 24 mmol/L (ref 22–32)
Calcium: 9.7 mg/dL (ref 8.9–10.3)
Chloride: 100 mmol/L (ref 98–111)
Creatinine, Ser: 0.6 mg/dL (ref 0.44–1.00)
GFR, Estimated: >60 mL/min (ref >60)
Glucose, Bld: 353 mg/dL — ABNORMAL HIGH (ref 70–99)
Potassium: 3.6 mmol/L (ref 3.5–5.1)
Sodium: 135 mmol/L (ref 135–145)
Total Bilirubin: 0.5 mg/dL (ref 0.3–1.2)
Total Protein: 7.9 g/dL (ref 6.5–8.1)

## 2023-02-03 LAB — GLUCOSE, CAPILLARY
Glucose-Capillary: 156 mg/dL — ABNORMAL HIGH (ref 70–99)
Glucose-Capillary: 175 mg/dL — ABNORMAL HIGH (ref 70–99)

## 2023-02-03 LAB — SEDIMENTATION RATE: Sed Rate: 4 mm/h (ref 0–22)

## 2023-02-03 LAB — C-REACTIVE PROTEIN: CRP: 8.4 mg/dL — ABNORMAL HIGH (ref <1.0)

## 2023-02-03 LAB — LIPASE, BLOOD: Lipase: 22 U/L (ref 11–51)

## 2023-02-03 MED ORDER — SODIUM CHLORIDE 0.9 % IV BOLUS
500.0000 mL | Freq: Once | INTRAVENOUS | Status: AC
  Administered 2023-02-03: 500 mL via INTRAVENOUS

## 2023-02-03 MED ORDER — METOPROLOL SUCCINATE ER 25 MG PO TB24: 25.0000 mg | ORAL_TABLET | Freq: Every day | ORAL | Status: DC

## 2023-02-03 MED ORDER — ONDANSETRON HCL 4 MG PO TABS: 4.0000 mg | ORAL_TABLET | Freq: Four times a day (QID) | ORAL | Status: DC | PRN

## 2023-02-03 MED ORDER — ROSUVASTATIN CALCIUM 20 MG PO TABS
40.0000 mg | ORAL_TABLET | Freq: Every evening | ORAL | Status: DC
  Administered 2023-02-03 – 2023-02-05 (×3): 40 mg via ORAL
  Filled 2023-02-03 (×3): qty 2

## 2023-02-03 MED ORDER — SODIUM CHLORIDE 0.9 % IV SOLN: INTRAVENOUS | Status: DC

## 2023-02-03 MED ORDER — SODIUM CHLORIDE 0.9 % IV SOLN
2.0000 g | INTRAVENOUS | Status: DC
  Administered 2023-02-04 – 2023-02-06 (×3): 2 g via INTRAVENOUS
  Filled 2023-02-03 (×3): qty 20

## 2023-02-03 MED ORDER — INFLUENZA VIRUS VACC SPLIT PF (FLUZONE) 0.5 ML IM SUSY
0.5000 mL | PREFILLED_SYRINGE | INTRAMUSCULAR | Status: AC
  Administered 2023-02-06: 0.5 mL via INTRAMUSCULAR
  Filled 2023-02-03 (×2): qty 0.5

## 2023-02-03 MED ORDER — ONDANSETRON HCL 4 MG/2ML IJ SOLN: 4.0000 mg | Freq: Four times a day (QID) | INTRAMUSCULAR | Status: DC | PRN

## 2023-02-03 MED ORDER — INSULIN ASPART 100 UNIT/ML IJ SOLN
0.0000 [IU] | Freq: Three times a day (TID) | INTRAMUSCULAR | Status: DC
  Administered 2023-02-03: 8 [IU] via SUBCUTANEOUS
  Administered 2023-02-03 – 2023-02-04 (×2): 3 [IU] via SUBCUTANEOUS
  Administered 2023-02-04: 8 [IU] via SUBCUTANEOUS
  Administered 2023-02-05: 5 [IU] via SUBCUTANEOUS
  Administered 2023-02-06: 3 [IU] via SUBCUTANEOUS
  Filled 2023-02-03: qty 0.15

## 2023-02-03 MED ORDER — OXYCODONE HCL 5 MG PO TABS
5.0000 mg | ORAL_TABLET | ORAL | Status: DC | PRN
  Administered 2023-02-04 – 2023-02-06 (×2): 5 mg via ORAL
  Filled 2023-02-03 (×2): qty 1

## 2023-02-03 MED ORDER — METRONIDAZOLE 500 MG/100ML IV SOLN
500.0000 mg | Freq: Once | INTRAVENOUS | Status: AC
  Administered 2023-02-03: 500 mg via INTRAVENOUS
  Filled 2023-02-03: qty 100

## 2023-02-03 MED ORDER — METOPROLOL SUCCINATE ER 25 MG PO TB24
25.0000 mg | ORAL_TABLET | Freq: Every day | ORAL | Status: DC
  Administered 2023-02-03 – 2023-02-06 (×4): 25 mg via ORAL
  Filled 2023-02-03 (×4): qty 1

## 2023-02-03 MED ORDER — SODIUM CHLORIDE (PF) 0.9 % IJ SOLN
INTRAMUSCULAR | Status: AC
  Filled 2023-02-03: qty 50

## 2023-02-03 MED ORDER — HYDROMORPHONE HCL 1 MG/ML IJ SOLN: 0.5000 mg | INTRAMUSCULAR | Status: DC | PRN

## 2023-02-03 MED ORDER — INSULIN GLARGINE-YFGN 100 UNIT/ML ~~LOC~~ SOLN
55.0000 [IU] | Freq: Every day | SUBCUTANEOUS | Status: DC
Start: 2023-02-03 — End: 2023-02-03

## 2023-02-03 MED ORDER — INSULIN ASPART 100 UNIT/ML IJ SOLN
0.0000 [IU] | Freq: Every day | INTRAMUSCULAR | Status: DC
  Administered 2023-02-05: 2 [IU] via SUBCUTANEOUS
  Filled 2023-02-03: qty 0.05

## 2023-02-03 MED ORDER — IOHEXOL 300 MG/ML  SOLN
100.0000 mL | Freq: Once | INTRAMUSCULAR | Status: AC | PRN
  Administered 2023-02-03: 100 mL via INTRAVENOUS

## 2023-02-03 MED ORDER — INSULIN GLARGINE-YFGN 100 UNIT/ML ~~LOC~~ SOLN
25.0000 [IU] | Freq: Every day | SUBCUTANEOUS | Status: DC
  Administered 2023-02-03 – 2023-02-06 (×4): 25 [IU] via SUBCUTANEOUS
  Filled 2023-02-03 (×4): qty 0.25

## 2023-02-03 MED ORDER — SODIUM CHLORIDE 0.9 % IV SOLN
2.0000 g | Freq: Once | INTRAVENOUS | Status: AC
  Administered 2023-02-03: 2 g via INTRAVENOUS
  Filled 2023-02-03: qty 20

## 2023-02-03 MED ORDER — LISINOPRIL 20 MG PO TABS
40.0000 mg | ORAL_TABLET | Freq: Every day | ORAL | Status: DC
  Administered 2023-02-03 – 2023-02-06 (×4): 40 mg via ORAL
  Filled 2023-02-03 (×3): qty 2
  Filled 2023-02-03: qty 4

## 2023-02-03 MED ORDER — PANTOPRAZOLE SODIUM 40 MG IV SOLR
40.0000 mg | INTRAVENOUS | Status: DC
  Administered 2023-02-03 – 2023-02-05 (×3): 40 mg via INTRAVENOUS
  Filled 2023-02-03 (×4): qty 10

## 2023-02-03 MED ORDER — TRAZODONE HCL 50 MG PO TABS
25.0000 mg | ORAL_TABLET | Freq: Every evening | ORAL | Status: DC | PRN
  Administered 2023-02-05: 25 mg via ORAL
  Filled 2023-02-03: qty 1

## 2023-02-03 MED ORDER — DICYCLOMINE HCL 10 MG PO CAPS
10.0000 mg | ORAL_CAPSULE | Freq: Every day | ORAL | Status: DC
  Administered 2023-02-03 – 2023-02-06 (×4): 10 mg via ORAL
  Filled 2023-02-03 (×4): qty 1

## 2023-02-03 MED ORDER — ALBUTEROL SULFATE (2.5 MG/3ML) 0.083% IN NEBU: 2.5000 mg | INHALATION_SOLUTION | RESPIRATORY_TRACT | Status: DC | PRN

## 2023-02-03 MED ORDER — BUPROPION HCL ER (SR) 150 MG PO TB12
150.0000 mg | ORAL_TABLET | Freq: Two times a day (BID) | ORAL | Status: DC
  Administered 2023-02-03 – 2023-02-06 (×7): 150 mg via ORAL
  Filled 2023-02-03 (×8): qty 1

## 2023-02-03 MED ORDER — LORATADINE 10 MG PO TABS
10.0000 mg | ORAL_TABLET | Freq: Every day | ORAL | Status: DC
  Administered 2023-02-03 – 2023-02-05 (×3): 10 mg via ORAL
  Filled 2023-02-03 (×4): qty 1

## 2023-02-03 NOTE — ED Triage Notes (Signed)
Pt BIB [facility] from home. Pt c/o lower abd pain that started after she ate [employer]. Pt reports having blood on toilet paper every time she has a bowel movement. 148/92 96HR 20RR 98% RA

## 2023-02-03 NOTE — ED Notes (Signed)
Pt was assisted to the bathroom with a wheelchair.

## 2023-02-03 NOTE — H&P (Signed)
History and Physical  Sarah Charles:[id number] DOB: 10-20-1960 DOA: 02/03/2023  PCP: [name], MD   Chief Complaint: Abdominal pain, diarrhea  HPI: Sarah Charles is a 62 y.o. female with medical history significant for poorly controlled insulin-dependent type 2 diabetes, GERD, hyperlipidemia, hypertension admitted to the hospital with pancolitis.  Patient was in her usual state of health until yesterday evening, when she ate at [employer] and about 30 minutes later had onset of abdominal severe cramping, and diarrhea.  She has had several rounds of diarrhea, and has noticed bright red blood in her stool.  She also vomited, just the contents of which she gave no blood.  Denies fevers but was having subjective chills earlier this morning.  ED Course: In the ER she has been afebrile, tachycardic and hypertensive. Lab work reveals WBC 13, hemoglobin 16, unremarkable CMP.  CT scan as detailed below shows evidence of pancolitis.  She was started on empiric IV antibiotics, and hospitalist was contacted for admission.  Review of Systems: Please see HPI for pertinent positives and negatives. A complete 10 system review of systems are otherwise negative.  Past Medical History:  Diagnosis Date   Arthritis    Coronary artery calcification seen on CT scan    a. 07/2017 noted on CT abd.   Depression    GERD (gastroesophageal reflux disease)    Hyperlipidemia    Hypertension    Obesity    Sleep apnea    a. Does not use CPAP "cause i dont think I need it anymore".   Tobacco abuse    Type II diabetes mellitus [facility])    Past Surgical History:  Procedure Laterality Date   BIOPSY  12/21/2022   Procedure: BIOPSY;  Surgeon: Kerin Salen, MD;  Location: [facility];  Service: Gastroenterology;;   CHOLECYSTECTOMY     CORONARY PRESSURE/FFR STUDY N/A 11/04/2017   Procedure: INTRAVASCULAR PRESSURE WIRE/FFR STUDY;  Surgeon: Swaziland, Peter M, MD;  Location: [facility] INVASIVE CV LAB;  Service: Cardiovascular;   Laterality: N/A;   CORONARY STENT INTERVENTION N/A 11/04/2017   Procedure: CORONARY STENT INTERVENTION;  Surgeon: Swaziland, Peter M, MD;  Location: [facility] INVASIVE CV LAB;  Service: Cardiovascular;  Laterality: N/A;   ESOPHAGOGASTRODUODENOSCOPY (EGD) WITH PROPOFOL N/A 12/21/2022   Procedure: ESOPHAGOGASTRODUODENOSCOPY (EGD) WITH PROPOFOL;  Surgeon: Kerin Salen, MD;  Location: [facility];  Service: Gastroenterology;  Laterality: N/A;   INCISION AND DRAINAGE ABSCESS Left 12/22/2012   Procedure: INCISION AND DRAINAGE ABSCESS;  Surgeon: Dalia Heading, MD;  Location: [facility];  Service: General;  Laterality: Left;   LEFT HEART CATH AND CORONARY ANGIOGRAPHY N/A 11/04/2017   Procedure: LEFT HEART CATH AND CORONARY ANGIOGRAPHY;  Surgeon: Swaziland, Peter M, MD;  Location: [facility] INVASIVE CV LAB;  Service: Cardiovascular;  Laterality: N/A;   TONSILLECTOMY     TUBAL LIGATION      Social History:  reports that she has been smoking cigarettes. She started smoking about 45 years ago. She has a 41 pack-year smoking history. She has never used smokeless tobacco. She reports that she does not drink alcohol and does not use drugs.   Allergies  Allergen Reactions   Cymbalta [Duloxetine Hcl] Nausea Only and Other (See Comments)    Nausea, lack of therapeutic effect    Family History  Problem Relation Age of Onset   Hypertension Mother    CVA Mother    COPD Mother    Heart disease Mother    Kidney Stones Mother    Heart attack Mother  MI in late 50's   Breast cancer Mother    CAD Father    Hypercholesterolemia Father    Heart attack Father        Died @ 35 of MI   Diabetes Sister    Cancer Maternal Uncle    Diabetes Maternal Grandmother    Cancer Maternal Grandfather        lung cancer   Colon cancer Neg Hx    Esophageal cancer Neg Hx    Stomach cancer Neg Hx    Pancreatic cancer Neg Hx      Prior to Admission medications   Medication Sig Start Date End Date Taking? Authorizing Provider  aspirin EC  81 MG tablet Take 81 mg by mouth daily.    [provider]  Blood Glucose Monitoring Suppl (ONETOUCH VERIO) w/Device KIT Use to test blood sugar 3 times a day 02/09/22   [name], MD  buPROPion (WELLBUTRIN SR) 150 MG 12 hr tablet Take 1 tablet (150 mg total) by mouth 2 (two) times daily. 01/28/23   [name], MD  cetirizine (ZYRTEC) 10 MG tablet TAKE 1 TABLET BY MOUTH EVERY DAY Patient taking differently: Take 10 mg by mouth daily. 08/02/19   [name], MD  clotrimazole (LOTRIMIN) 1 % cream APPLY TO THE AFFECTED AREA(S) beneath BOTH breasts TWICE DAILY Patient taking differently: Apply 1 Application topically 2 (two) times daily. 03/08/22   [name], MD  diclofenac Sodium (VOLTAREN) 1 % GEL Apply 4 g topically 4 (four) times daily. 05/05/21   [name], MD  dicyclomine (BENTYL) 10 MG capsule Take 1 capsule (10 mg total) by mouth daily. 02/12/22   [name], MD  fenofibrate (TRICOR) 145 MG tablet Take 1 tablet (145 mg total) by mouth every evening. 01/28/23   [name], MD  FLUoxetine (PROZAC) 20 MG capsule TAKE ONE CAPSULE BY MOUTH EVERY MORNING 01/28/23   [name], MD  furosemide (LASIX) 20 MG tablet Take 1 tablet (20 mg total) by mouth daily. 11/12/21   [name], MD  gabapentin (NEURONTIN) 300 MG capsule Take 2 capsules (600 mg total) by mouth 2 (two) times daily. 01/28/23   [name], MD  glucose blood (ONETOUCH VERIO) test strip Use as instructed 02/09/22   [name], MD  hydrOXYzine (ATARAX) 25 MG tablet Take 25 mg by mouth 3 (three) times daily. 12/15/22 06/13/23  [provider]  insulin glargine (LANTUS SOLOSTAR) 100 UNIT/ML Solostar Pen Inject 55 Units into the skin at bedtime. 01/28/23   [name], MD  Insulin Pen Needle (COMFORT EZ PEN NEEDLES) 31G X 5 MM MISC USE AS DIRECTED ONCE DAILY 09/16/21   [name], MD  Lancets [facility] DELICA PLUS LANCET33G) MISC Use to test blood sugar 3 times a day 02/09/22    [name], MD  Lancets Misc. (ACCU-CHEK FASTCLIX LANCET) KIT USE AS DIRECTED 11/12/21   [name], MD  lidocaine (LIDODERM) 5 % Place ONE PATCH onto THE SKIN daily. REMOVE & Discard PATCH WITHIN 12 hours OR as directed by MD 12/30/22   [name], MD  lisinopril (ZESTRIL) 40 MG tablet Take 1 tablet (40 mg total) by mouth daily. 01/28/23   [name], MD  metFORMIN (GLUCOPHAGE) 500 MG tablet Take 2 tablets (1,000 mg total) by mouth 2 (two) times daily. TAKE TWO TABLETS BY MOUTH TWICE DAILY WITH A MEAL Strength: 500 mg 01/28/23   [name], MD  methocarbamol (ROBAXIN) 500 MG tablet Take 500 mg by mouth every 8 (eight) hours  as needed (for back pain).    [provider]  metoCLOPramide (REGLAN) 10 MG tablet TAKE 1 TABLET BY MOUTH EVERY EIGHT HOURS AS NEEDED FOR NAUSEA OR VOMITING. Patient taking differently: Take 10 mg by mouth every 8 (eight) hours as needed for vomiting or nausea. 08/14/20   Anders Simmonds, PA-C  metoprolol succinate (TOPROL-XL) 25 MG 24 hr tablet Take 1 tablet (25 mg total) by mouth daily. TAKE ONE TABLET BY MOUTH EVERY MORNING WITH OR immediately following A meal Strength: 25 mg 01/28/23   [name], MD  Multiple Vitamin (MULTIVITAMIN) tablet Take 1 tablet by mouth daily.    [provider]  mupirocin ointment (BACTROBAN) 2 % Apply 1 Application topically 2 (two) times daily. To affected area till better 03/19/22   Zenia Resides, MD  nitroGLYCERIN (NITROSTAT) 0.4 MG SL tablet Place 0.4 mg under the tongue every 5 (five) minutes as needed for chest pain.    [provider]  oxyCODONE (OXY IR/ROXICODONE) 5 MG immediate release tablet Take 5 mg by mouth every 4 (four) hours as needed for moderate pain. 11/29/22   [provider]  oxyCODONE-acetaminophen (PERCOCET/ROXICET) 5-325 MG tablet Take 1 tablet by mouth every 6 (six) hours as needed for severe pain. 11/16/22   Mardella Layman, MD  pantoprazole (PROTONIX) 40 MG tablet  Take 1 tablet (40 mg total) by mouth 2 (two) times daily. 12/23/22   Lewie Chamber, MD  pantoprazole (PROTONIX) 40 MG tablet Take 1 tablet (40 mg total) by mouth daily. 01/23/23   Lewie Chamber, MD  PRALUENT 75 MG/ML SOAJ INJECT 75 MG into THE SKIN every 14 DAYS Patient taking differently: Inject 75 mg as directed every 14 (fourteen) days. 06/16/21   Alver Sorrow, NP  rosuvastatin (CRESTOR) 40 MG tablet Take 1 tablet (40 mg total) by mouth every evening. 01/28/23   [name], MD  Semaglutide, 1 MG/DOSE, 4 MG/3ML SOPN Inject 1 mg as directed once a week. For 4 weeks then increase to 2mg  weekly thereafter Patient taking differently: Inject 1 mg as directed once a week. 02/25/22   [name], MD  Semaglutide, 2 MG/DOSE, 8 MG/3ML SOPN Inject 2 mg as directed once a week. 02/25/22   [name], MD  silver sulfADIAZINE (SILVADENE) 1 % cream Apply 1 Application topically daily. 11/21/22   Lonell Grandchild, MD  sucralfate (CARAFATE) 1 g tablet Take 1 tablet (1 g total) by mouth 4 (four) times daily. 12/23/22 01/22/23  Lewie Chamber, MD  terbinafine (LAMISIL) 250 MG tablet Please take one a day x 7days, repeat every 4 weeks x 4 months 05/07/21   Lenn Sink, DPM  traZODone (DESYREL) 50 MG tablet TAKE ONE TABLET BY MOUTH EVERYDAY AT BEDTIME 01/28/23   [name], MD  triamcinolone cream (KENALOG) 0.1 % APPLY TO THE AFFECTED AREA(S) ON BOTH LEGS TWICE DAILY Patient taking differently: Apply 1 Application topically See admin instructions. APPLY TO THE AFFECTED AREA(S) ON BOTH LEGS TWICE DAILY 05/10/22   [name], MD  vitamin B-12 (CYANOCOBALAMIN) 500 MCG tablet TAKE ONE TABLET BY MOUTH ONCE DAILY 01/10/23   [name], MD  vitamin C (ASCORBIC ACID) 500 MG tablet Take 500 mg by mouth daily.    [provider]    Physical Exam: BP (!) 191/102   Pulse 98   Temp 98.1 F (36.7 C)   Resp 16   SpO2 94%   General:  Alert, oriented, calm, in no acute distress, looks  tired but resting  comfortably Eyes: EOMI, clear conjuctivae, white sclerea Neck: supple, no masses, trachea mildline  Cardiovascular: RRR, no murmurs or rubs, no peripheral edema  Respiratory: clear to auscultation bilaterally, no wheezes, no crackles  Abdomen: soft, nontender, slightly distended, normal bowel tones heard  Skin: dry, no rashes  Musculoskeletal: no joint effusions, normal range of motion  Psychiatric: appropriate affect, normal speech  Neurologic: extraocular muscles intact, clear speech, moving all extremities with intact sensorium         Labs on Admission:  Basic Metabolic Panel: Recent Labs  Lab 02/03/23 0406  NA 135  K 3.6  CL 100  CO2 24  GLUCOSE 353*  BUN 26*  CREATININE 0.60  CALCIUM 9.7   Liver Function Tests: Recent Labs  Lab 02/03/23 0406  AST 15  ALT 15  ALKPHOS 129*  BILITOT 0.5  PROT 7.9  ALBUMIN 3.8   Recent Labs  Lab 02/03/23 0406  LIPASE 22   No results for input(s): "AMMONIA" in the last 168 hours. CBC: Recent Labs  Lab 02/03/23 0406  WBC 13.0*  NEUTROABS 11.3*  HGB 15.6*  HCT 46.8*  MCV 90.2  PLT 259   Cardiac Enzymes: No results for input(s): "CKTOTAL", "CKMB", "CKMBINDEX", "TROPONINI" in the last 168 hours.  BNP (last 3 results) Recent Labs    04/24/22 1615  BNP 74.6    ProBNP (last 3 results) Recent Labs    02/25/22 1053  PROBNP 71    CBG: No results for input(s): "GLUCAP" in the last 168 hours.  Radiological Exams on Admission: CT ABDOMEN PELVIS W CONTRAST  Result Date: 02/03/2023 CLINICAL DATA:  LLQ abdominal pain lower abdominal pain, V/D EXAM: CT ABDOMEN AND PELVIS WITH CONTRAST TECHNIQUE: Multidetector CT imaging of the abdomen and pelvis was performed using the standard protocol following bolus administration of intravenous contrast. RADIATION DOSE REDUCTION: This exam was performed according to the departmental dose-optimization program which includes automated exposure control, adjustment of the  mA and/or kV according to patient size and/or use of iterative reconstruction technique. CONTRAST:  OMNIPAQUE IOHEXOL 300 MG/ML  SOLN COMPARISON:  None Available. FINDINGS: Lower chest: No acute abnormality. Hepatobiliary: No focal liver abnormality. Status post cholecystectomy. No biliary dilatation. Pancreas: No focal lesion. Normal pancreatic contour. No surrounding inflammatory changes. No main pancreatic ductal dilatation. Spleen: Normal in size without focal abnormality. Adrenals/Urinary Tract: No adrenal nodule bilaterally. Bilateral kidneys enhance symmetrically. Subcentimeter hypodensities are too small to characterize-no further follow-up indicated. No hydronephrosis. No hydroureter. The urinary bladder is unremarkable. On delayed imaging, there is no urothelial wall thickening and there are no filling defects in the opacified portions of the bilateral collecting systems or ureters. Stomach/Bowel: Stomach is within normal limits. No evidence of small bowel wall thickening or dilatation. Third portion of the duodenum diverticula. Diffuse colonic collapse with associated bowel wall thickening and mucosal hyperemia. Single sigmoid colonic diverticula. Appendix appears normal. Vascular/Lymphatic: No abdominal aorta or iliac aneurysm. Severe atherosclerotic plaque of the aorta and its branches. No abdominal, pelvic, or inguinal lymphadenopathy. Reproductive: Uterus and bilateral adnexa are unremarkable. Other: No intraperitoneal free fluid. No intraperitoneal free gas. No organized fluid collection. Musculoskeletal: No abdominal wall hernia or abnormality. No suspicious lytic or blastic osseous lesions. No acute displaced fracture. Multilevel degenerative changes of the spine. Multilevel intervertebral disc space vacuum phenomenon. Endplate sclerosis at the L1-L2 and L5-S1 levels. Old L3 transverse process fractures versus congenital nonunion. IMPRESSION: 1. Pancolitis. 2. Single sigmoid colonic  diverticula.  No acute diverticulitis. 3.  Aortic Atherosclerosis (  ICD10-I70.0). Electronically Signed   By: Tish Frederickson M.D.   On: 02/03/2023 07:19    Assessment/Plan Nataliah Feria is a 62 y.o. female with medical history significant for poorly controlled insulin-dependent type 2 diabetes, GERD, hyperlipidemia, hypertension admitted to the hospital with pancolitis.   Pancolitis with rectal bleeding-potentially bacterial colitis, without evidence of abscess.  She is hemodynamically stable and not septic. -Inpatient admission -Clear liquid diet as tolerated -Continue empiric IV Rocephin and IV Flagyl -Check stool studies including C. difficile as she was recently hospitalized 8/1 for DKA -[facility] consultation requested  Type 2 diabetes-very poorly controlled, was admitted to the hospital about 6 weeks ago for DKA -Carb controlled diet when he is advanced -Will resume her home Lantus (she is intermittently compliant) at reduced dose of 25 units daily -Moderate dose sliding scale  Hypertension-continue home metoprolol and lisinopril  Depression-Wellbutrin  Hyperlipidemia-Crestor  DVT prophylaxis: SCDs only    Code Status: Full Code  Consults called: Gastroenterology, discussed with Dr. Meridee Score  Admission status: The appropriate patient status for this patient is INPATIENT. Inpatient status is judged to be reasonable and necessary in order to provide the required intensity of service to ensure the patient's safety. The patient's presenting symptoms, physical exam findings, and initial radiographic and laboratory data in the context of their chronic comorbidities is felt to place them at high risk for further clinical deterioration. Furthermore, it is not anticipated that the patient will be medically stable for discharge from the hospital within 2 midnights of admission.    I certify that at the point of admission it is my clinical judgment that the patient will require inpatient  hospital care spanning beyond 2 midnights from the point of admission due to high intensity of service, high risk for further deterioration and high frequency of surveillance required  Time spent: 56 minutes  Brittnie Lewey Sharlette Dense MD [facility] Pager [phone]  If 7PM-7AM, please contact night-coverage [email or web address] Password [facility]  02/03/2023, 8:34 AM

## 2023-02-03 NOTE — ED Provider Notes (Addendum)
[place] EMERGENCY DEPARTMENT AT [facility] Provider Note   CSN: [id number] Arrival date & time: 02/03/23  0330     History  Chief Complaint  Patient presents with   Abdominal Pain    Sarah Charles is a 62 y.o. female.  The history is provided by the patient and medical records.  Abdominal Pain Sarah Charles is a 62 y.o. female who presents to the Emergency Department complaining of abdominal pain.  She presents to the emergency department for evaluation of abdominal pain that started around 730.  She ate a [employer] around 630 and started feeling bad around 7.  Around 730 she developed numerous episodes of nausea, vomiting, diarrhea with associated lower abdominal cramping.  She does state that her initial bowel movement was large and described as Nannie balls.  She states that she had to "help it out".  Then she subsequently had diarrhea with blood mixed in.  She is unable to quantify the amount of blood.  No prior history of rectal bleeding.  No hematemesis.  No fever, dysuria.  No one else that ate with her got sick.  Hx/o HTN, DM, HPL.  Takes aspirin, no additional blood thinners that she is aware of.     Home Medications Prior to Admission medications   Medication Sig Start Date End Date Taking? Authorizing Provider  aspirin EC 81 MG tablet Take 81 mg by mouth daily.    [provider]  Blood Glucose Monitoring Suppl (ONETOUCH VERIO) w/Device KIT Use to test blood sugar 3 times a day 02/09/22   [name], MD  buPROPion (WELLBUTRIN SR) 150 MG 12 hr tablet Take 1 tablet (150 mg total) by mouth 2 (two) times daily. 01/28/23   [name], MD  cetirizine (ZYRTEC) 10 MG tablet TAKE 1 TABLET BY MOUTH EVERY DAY Patient taking differently: Take 10 mg by mouth daily. 08/02/19   [name], MD  clotrimazole (LOTRIMIN) 1 % cream APPLY TO THE AFFECTED AREA(S) beneath BOTH breasts TWICE DAILY Patient taking differently: Apply 1 Application topically 2  (two) times daily. 03/08/22   [name], MD  diclofenac Sodium (VOLTAREN) 1 % GEL Apply 4 g topically 4 (four) times daily. 05/05/21   [name], MD  dicyclomine (BENTYL) 10 MG capsule Take 1 capsule (10 mg total) by mouth daily. 02/12/22   [name], MD  fenofibrate (TRICOR) 145 MG tablet Take 1 tablet (145 mg total) by mouth every evening. 01/28/23   [name], MD  FLUoxetine (PROZAC) 20 MG capsule TAKE ONE CAPSULE BY MOUTH EVERY MORNING 01/28/23   [name], MD  furosemide (LASIX) 20 MG tablet Take 1 tablet (20 mg total) by mouth daily. 11/12/21   [name], MD  gabapentin (NEURONTIN) 300 MG capsule Take 2 capsules (600 mg total) by mouth 2 (two) times daily. 01/28/23   [name], MD  glucose blood (ONETOUCH VERIO) test strip Use as instructed 02/09/22   [name], MD  hydrOXYzine (ATARAX) 25 MG tablet Take 25 mg by mouth 3 (three) times daily. 12/15/22 06/13/23  [provider]  insulin glargine (LANTUS SOLOSTAR) 100 UNIT/ML Solostar Pen Inject 55 Units into the skin at bedtime. 01/28/23   [name], MD  Insulin Pen Needle (COMFORT EZ PEN NEEDLES) 31G X 5 MM MISC USE AS DIRECTED ONCE DAILY 09/16/21   [name], MD  Lancets [facility] DELICA PLUS LANCET33G) MISC Use to test blood sugar 3 times a day 02/09/22   [name], MD  Lancets Misc. (ACCU-CHEK  FASTCLIX LANCET) KIT USE AS DIRECTED 11/12/21   [name], MD  lidocaine (LIDODERM) 5 % Place ONE PATCH onto THE SKIN daily. REMOVE & Discard PATCH WITHIN 12 hours OR as directed by MD 12/30/22   [name], MD  lisinopril (ZESTRIL) 40 MG tablet Take 1 tablet (40 mg total) by mouth daily. 01/28/23   [name], MD  metFORMIN (GLUCOPHAGE) 500 MG tablet Take 2 tablets (1,000 mg total) by mouth 2 (two) times daily. TAKE TWO TABLETS BY MOUTH TWICE DAILY WITH A MEAL Strength: 500 mg 01/28/23   [name], MD  methocarbamol (ROBAXIN) 500 MG tablet Take 500 mg by mouth every 8 (eight)  hours as needed (for back pain).    [provider]  metoCLOPramide (REGLAN) 10 MG tablet TAKE 1 TABLET BY MOUTH EVERY EIGHT HOURS AS NEEDED FOR NAUSEA OR VOMITING. Patient taking differently: Take 10 mg by mouth every 8 (eight) hours as needed for vomiting or nausea. 08/14/20   Anders Simmonds, PA-C  metoprolol succinate (TOPROL-XL) 25 MG 24 hr tablet Take 1 tablet (25 mg total) by mouth daily. TAKE ONE TABLET BY MOUTH EVERY MORNING WITH OR immediately following A meal Strength: 25 mg 01/28/23   [name], MD  Multiple Vitamin (MULTIVITAMIN) tablet Take 1 tablet by mouth daily.    [provider]  mupirocin ointment (BACTROBAN) 2 % Apply 1 Application topically 2 (two) times daily. To affected area till better 03/19/22   Zenia Resides, MD  nitroGLYCERIN (NITROSTAT) 0.4 MG SL tablet Place 0.4 mg under the tongue every 5 (five) minutes as needed for chest pain.    [provider]  oxyCODONE (OXY IR/ROXICODONE) 5 MG immediate release tablet Take 5 mg by mouth every 4 (four) hours as needed for moderate pain. 11/29/22   [provider]  oxyCODONE-acetaminophen (PERCOCET/ROXICET) 5-325 MG tablet Take 1 tablet by mouth every 6 (six) hours as needed for severe pain. 11/16/22   Mardella Layman, MD  pantoprazole (PROTONIX) 40 MG tablet Take 1 tablet (40 mg total) by mouth 2 (two) times daily. 12/23/22   Lewie Chamber, MD  pantoprazole (PROTONIX) 40 MG tablet Take 1 tablet (40 mg total) by mouth daily. 01/23/23   Lewie Chamber, MD  PRALUENT 75 MG/ML SOAJ INJECT 75 MG into THE SKIN every 14 DAYS Patient taking differently: Inject 75 mg as directed every 14 (fourteen) days. 06/16/21   Alver Sorrow, NP  rosuvastatin (CRESTOR) 40 MG tablet Take 1 tablet (40 mg total) by mouth every evening. 01/28/23   [name], MD  Semaglutide, 1 MG/DOSE, 4 MG/3ML SOPN Inject 1 mg as directed once a week. For 4 weeks then increase to 2mg  weekly thereafter Patient taking differently:  Inject 1 mg as directed once a week. 02/25/22   [name], MD  Semaglutide, 2 MG/DOSE, 8 MG/3ML SOPN Inject 2 mg as directed once a week. 02/25/22   [name], MD  silver sulfADIAZINE (SILVADENE) 1 % cream Apply 1 Application topically daily. 11/21/22   Lonell Grandchild, MD  sucralfate (CARAFATE) 1 g tablet Take 1 tablet (1 g total) by mouth 4 (four) times daily. 12/23/22 01/22/23  Lewie Chamber, MD  terbinafine (LAMISIL) 250 MG tablet Please take one a day x 7days, repeat every 4 weeks x 4 months 05/07/21   Lenn Sink, DPM  traZODone (DESYREL) 50 MG tablet TAKE ONE TABLET BY MOUTH EVERYDAY AT BEDTIME 01/28/23   [name], MD  triamcinolone cream (KENALOG) 0.1 % APPLY TO THE  AFFECTED AREA(S) ON BOTH LEGS TWICE DAILY Patient taking differently: Apply 1 Application topically See admin instructions. APPLY TO THE AFFECTED AREA(S) ON BOTH LEGS TWICE DAILY 05/10/22   [name], MD  vitamin B-12 (CYANOCOBALAMIN) 500 MCG tablet TAKE ONE TABLET BY MOUTH ONCE DAILY 01/10/23   [name], MD  vitamin C (ASCORBIC ACID) 500 MG tablet Take 500 mg by mouth daily.    [provider]      Allergies    Cymbalta [duloxetine hcl]    Review of Systems   Review of Systems  Gastrointestinal:  Positive for abdominal pain.  All other systems reviewed and are negative.   Physical Exam Updated Vital Signs BP (!) 179/159   Pulse (!) 101   Temp 97.9 F (36.6 C) (Oral)   Resp 18   SpO2 98%  Physical Exam Vitals and nursing note reviewed.  Constitutional:      Appearance: She is well-developed.  HENT:     Head: Normocephalic and atraumatic.  Cardiovascular:     Rate and Rhythm: Normal rate and regular rhythm.     Heart sounds: No murmur heard. Pulmonary:     Effort: Pulmonary effort is normal. No respiratory distress.     Breath sounds: Normal breath sounds.  Abdominal:     Palpations: Abdomen is soft.     Tenderness: There is no guarding or rebound.     Comments:  Mild generalized tenderness  Genitourinary:    Comments: Small amount of blood mixed with brown/mucousy stool.  No external hemorrhoids Musculoskeletal:        General: No tenderness.  Skin:    General: Skin is warm and dry.  Neurological:     Mental Status: She is alert and oriented to person, place, and time.  Psychiatric:        Behavior: Behavior normal.     ED Results / Procedures / Treatments   Labs (all labs ordered are listed, but only abnormal results are displayed) Labs Reviewed  COMPREHENSIVE METABOLIC PANEL - Abnormal; Notable for the following components:      Result Value   Glucose, Bld 353 (*)    BUN 26 (*)    Alkaline Phosphatase 129 (*)    All other components within normal limits  CBC WITH DIFFERENTIAL/PLATELET - Abnormal; Notable for the following components:   WBC 13.0 (*)    RBC 5.19 (*)    Hemoglobin 15.6 (*)    HCT 46.8 (*)    Neutro Abs 11.3 (*)    All other components within normal limits  C DIFFICILE QUICK SCREEN W PCR REFLEX    LIPASE, BLOOD    EKG None  Radiology CT ABDOMEN PELVIS W CONTRAST  Result Date: 02/03/2023 CLINICAL DATA:  LLQ abdominal pain lower abdominal pain, V/D EXAM: CT ABDOMEN AND PELVIS WITH CONTRAST TECHNIQUE: Multidetector CT imaging of the abdomen and pelvis was performed using the standard protocol following bolus administration of intravenous contrast. RADIATION DOSE REDUCTION: This exam was performed according to the departmental dose-optimization program which includes automated exposure control, adjustment of the mA and/or kV according to patient size and/or use of iterative reconstruction technique. CONTRAST:  OMNIPAQUE IOHEXOL 300 MG/ML  SOLN COMPARISON:  None Available. FINDINGS: Lower chest: No acute abnormality. Hepatobiliary: No focal liver abnormality. Status post cholecystectomy. No biliary dilatation. Pancreas: No focal lesion. Normal pancreatic contour. No surrounding inflammatory changes. No main pancreatic  ductal dilatation. Spleen: Normal in size without focal abnormality. Adrenals/Urinary Tract: No adrenal nodule bilaterally. Bilateral  kidneys enhance symmetrically. Subcentimeter hypodensities are too small to characterize-no further follow-up indicated. No hydronephrosis. No hydroureter. The urinary bladder is unremarkable. On delayed imaging, there is no urothelial wall thickening and there are no filling defects in the opacified portions of the bilateral collecting systems or ureters. Stomach/Bowel: Stomach is within normal limits. No evidence of small bowel wall thickening or dilatation. Third portion of the duodenum diverticula. Diffuse colonic collapse with associated bowel wall thickening and mucosal hyperemia. Single sigmoid colonic diverticula. Appendix appears normal. Vascular/Lymphatic: No abdominal aorta or iliac aneurysm. Severe atherosclerotic plaque of the aorta and its branches. No abdominal, pelvic, or inguinal lymphadenopathy. Reproductive: Uterus and bilateral adnexa are unremarkable. Other: No intraperitoneal free fluid. No intraperitoneal free gas. No organized fluid collection. Musculoskeletal: No abdominal wall hernia or abnormality. No suspicious lytic or blastic osseous lesions. No acute displaced fracture. Multilevel degenerative changes of the spine. Multilevel intervertebral disc space vacuum phenomenon. Endplate sclerosis at the L1-L2 and L5-S1 levels. Old L3 transverse process fractures versus congenital nonunion. IMPRESSION: 1. Pancolitis. 2. Single sigmoid colonic diverticula.  No acute diverticulitis. 3.  Aortic Atherosclerosis (ICD10-I70.0). Electronically Signed   By: Tish Frederickson M.D.   On: 02/03/2023 07:19    Procedures Procedures    Medications Ordered in ED Medications  metoprolol succinate (TOPROL-XL) 24 hr tablet 25 mg (has no administration in time range)  cefTRIAXone (ROCEPHIN) 2 g in sodium chloride 0.9 % 100 mL IVPB (has no administration in time range)     And  metroNIDAZOLE (FLAGYL) IVPB 500 mg (has no administration in time range)  sodium chloride 0.9 % bolus 500 mL (0 mLs Intravenous Stopped 02/03/23 0709)  iohexol (OMNIPAQUE) 300 MG/ML solution 100 mL (100 mLs Intravenous Contrast Given 02/03/23 0102)    ED Course/ Medical Decision Making/ A&P                                 Medical Decision Making Amount and/or Complexity of Data Reviewed Labs: ordered. Radiology: ordered.  Risk Prescription drug management.   Patient with history of diabetes, hypertension, hyperlipidemia here for evaluation of abdominal pain, vomiting and diarrhea.  She does report bloody stool at home.  There is just a small amount of blood on DRE.  She has abdominal tenderness without peritoneal findings.  CBC with leukocytosis.  She is hypertensive in the emergency department, but she has missed her blood pressure medications.  Will treat with her metoprolol.  CT scan with pancolitis.  Patient did have additional BM in the emergency department with gross blood present.  Some of the blood was mixed with yellow and mucus filled stool.  Plan to admit for ongoing care.  Given her recent hospitalization we will send a C. difficile.  Will start on antibiotics.        Final Clinical Impression(s) / ED Diagnoses Final diagnoses:  Pancolitis [facility])    Rx / DC Orders ED Discharge Orders     None         Tilden Fossa, MD 02/03/23 7253    Tilden Fossa, MD 02/03/23 [phone]

## 2023-02-03 NOTE — Plan of Care (Signed)
  Problem: Education: Goal: Knowledge of General Education information will improve Description: Including pain rating scale, medication(s)/side effects and non-pharmacologic comfort measures Outcome: Progressing   Problem: Health Behavior/Discharge Planning: Goal: Ability to manage health-related needs will improve Outcome: Progressing   Problem: Activity: Goal: Risk for activity intolerance will decrease Outcome: Progressing   Problem: Nutrition: Goal: Adequate nutrition will be maintained Outcome: Progressing   Problem: Elimination: Goal: Will not experience complications related to bowel motility Outcome: Progressing Goal: Will not experience complications related to urinary retention Outcome: Progressing   Problem: Pain Managment: Goal: General experience of comfort will improve Outcome: Progressing   Problem: Safety: Goal: Ability to remain free from injury will improve Outcome: Progressing   Problem: Skin Integrity: Goal: Risk for impaired skin integrity will decrease Outcome: Progressing   

## 2023-02-03 NOTE — Consult Note (Signed)
Consultation Note   Referring Provider:  [facility] PCP: [name], MD Primary Gastroenterologist: Dr. Tomasa Rand     Reason for Consultation: Pancolitis with rectal bleeding DOA: 02/03/2023         Hospital Day: 1   ASSESSMENT & PLAN   62 y.o. year old female with medical history significant for poorly controlled insulin-dependent type 2 diabetes, CAD post stent placement (2019), chronic tobacco use, GERD, hyperlipidemia, hypertension admitted to the hospital with pancolitis. GI consulted for evaluation.  Pancolitis with rectal bleeding (acute) -potentially bacterial/infectious colitis, without evidence of abscess. She is hemodynamically stable and not septic. Afebrile. WBC 13.0. Hgb 15.6. Received IV Rocephin and Flagyl in ER. No known hx of previous episode. No family hx. Last colonoscopy in 2012.CT scan shows pan colitis. Diverticulosis without diverticulitis. -Monitor H/H -Transfuse if HGB <7 - Continue Clear liquid diet as tolerated -Continue Pantoprazole 40 mg IV - Pending Cdiff stool studies -Check CRP, ESR - Check GI panel  GERD with hx of esophagitis. Last EGD with Dr. Marca Ancona (12/21/22) during hospitalization where she had esophageal ulcers and Grade D eosphagitis. Discharged home with PPI therapy BID and sucralfate. Pt reports completing regimen.Denies GERD symptoms. -Continue Pantoprazole 40 mg IV  Type 2 diabetes-very poorly controlled, was admitted to the hospital about 6 weeks ago for DKA. Glucose 353 on admittance. - on SSI per hospitalist   CAD with stents in 2019 (on ASA). Last dose this morning  VTE -recommend Ted hose and SCDs  Additional co-morbidities include :Hypertension-on metoprolol and lisinopril,Depression-  on Wellbutrin, Hyperlipidemia-  on Crestor   HISTORY OF PRESENT ILLNESS   Patient lying in bed. Reports yesterday evening she went out to eat at [employer] with her son and then a  baseball games. Shortly after eating she had severe lower abdominal cramping, diarrhea, nausea and vomiting. She reports she was in the bathroom for long period of time until it resolved. She was later woke up at 0200 with another bout of abdominal cramping and bloody stools which alarmed her to come to ER. She has had at least 5 bloody stools since this morning, last one about 20 minutes ago. Patient is taking baby aspirin for CAD, last dose reported this morning. She also states she takes OTC Aleve 1 tablet daily.  No recent exposure to anyone who is also ill. No recent antibiotic use. Family history of diverticular disease in mother. No alcohol use. Current every day smoker 1/2 pack daily for over 45 years. Reports her last colonoscopy was in 2012 with Dr. Matthias Hughs. She takes Pantoprazole 40 mg po daily at home for GERD. Denies GERD symptoms or dysphagia. No CP or SOB. Reports intermittent dizziness as of recent. No hx of anemia or blood transfusions. No hematemesis.  Previous GI Evaluations   CT ABDOMEN AND PELVIS WITH CONTRAST  (02/03/2023)  IMPRESSION: 1. Pancolitis. 2. Single sigmoid colonic diverticula.  No acute diverticulitis. 3.  Aortic Atherosclerosis (ICD10-I70.0).  UPPER ENDOSCOPY (12/21/2022)  Impression:  - Esophageal ulcers. Biopsied.  - LA Grade D esophagitis with no bleeding. - Erythematous mucosa in the stomach. Biopsied.  - Non- bleeding duodenal ulcers with pigmented material.  Per Dr. Milas Hock note: She had a colonoscopy in 2012 by Dr. Matthias Hughs at  [facility] in which hyperplastic polyp was removed. She did not have any adenomas.   EGD with DIL (06/2001) Endoscopy with esophageal dilatation by Dr. Melvia Heaps for dysphagia and reflux symptoms.   Assessment revealed abnormal examination showing esophagitis grade a.  Dilatation for dysphagia without stricture.  Labs and Imaging:  ECHO (10/2021)  Left ventricular ejection fraction, by estimation, is 70 to 75%.   Recent  Labs    02/03/23 0406  WBC 13.0*  HGB 15.6*  HCT 46.8*  PLT 259   Recent Labs    02/03/23 0406  NA 135  K 3.6  CL 100  CO2 24  GLUCOSE 353*  BUN 26*  CREATININE 0.60  CALCIUM 9.7   Recent Labs    02/03/23 0406  PROT 7.9  ALBUMIN 3.8  AST 15  ALT 15  ALKPHOS 129*  BILITOT 0.5   No results for input(s): "HEPBSAG", "HCVAB", "HEPAIGM", "HEPBIGM" in the last 72 hours. No results for input(s): "LABPROT", "INR" in the last 72 hours.    Past Medical History:  Diagnosis Date   Arthritis    Coronary artery calcification seen on CT scan    a. 07/2017 noted on CT abd.   Depression    GERD (gastroesophageal reflux disease)    Hyperlipidemia    Hypertension    Obesity    Sleep apnea    a. Does not use CPAP "cause i dont think I need it anymore".   Tobacco abuse    Type II diabetes mellitus [facility])     Past Surgical History:  Procedure Laterality Date   BIOPSY  12/21/2022   Procedure: BIOPSY;  Surgeon: Kerin Salen, MD;  Location: [facility];  Service: Gastroenterology;;   CHOLECYSTECTOMY     CORONARY PRESSURE/FFR STUDY N/A 11/04/2017   Procedure: INTRAVASCULAR PRESSURE WIRE/FFR STUDY;  Surgeon: Swaziland, Peter M, MD;  Location: [facility] INVASIVE CV LAB;  Service: Cardiovascular;  Laterality: N/A;   CORONARY STENT INTERVENTION N/A 11/04/2017   Procedure: CORONARY STENT INTERVENTION;  Surgeon: Swaziland, Peter M, MD;  Location: [facility] INVASIVE CV LAB;  Service: Cardiovascular;  Laterality: N/A;   ESOPHAGOGASTRODUODENOSCOPY (EGD) WITH PROPOFOL N/A 12/21/2022   Procedure: ESOPHAGOGASTRODUODENOSCOPY (EGD) WITH PROPOFOL;  Surgeon: Kerin Salen, MD;  Location: [facility] ENDOSCOPY;  Service: Gastroenterology;  Laterality: N/A;   INCISION AND DRAINAGE ABSCESS Left 12/22/2012   Procedure: INCISION AND DRAINAGE ABSCESS;  Surgeon: Dalia Heading, MD;  Location: [facility] ORS;  Service: General;  Laterality: Left;   LEFT HEART CATH AND CORONARY ANGIOGRAPHY N/A 11/04/2017   Procedure: LEFT HEART CATH AND CORONARY  ANGIOGRAPHY;  Surgeon: Swaziland, Peter M, MD;  Location: [facility] INVASIVE CV LAB;  Service: Cardiovascular;  Laterality: N/A;   TONSILLECTOMY     TUBAL LIGATION      Family History  Problem Relation Age of Onset   Hypertension Mother    CVA Mother    COPD Mother    Heart disease Mother    Kidney Stones Mother    Heart attack Mother        MI in late 22's   Breast cancer Mother    CAD Father    Hypercholesterolemia Father    Heart attack Father        Died @ 90 of MI   Diabetes Sister    Cancer Maternal Uncle    Diabetes Maternal Grandmother    Cancer Maternal Grandfather        lung cancer   Colon cancer Neg Hx    Esophageal cancer  Neg Hx    Stomach cancer Neg Hx    Pancreatic cancer Neg Hx     Prior to Admission medications   Medication Sig Start Date End Date Taking? Authorizing Provider  aspirin EC 81 MG tablet Take 81 mg by mouth daily.    [provider]  Blood Glucose Monitoring Suppl (ONETOUCH VERIO) w/Device KIT Use to test blood sugar 3 times a day 02/09/22   [name], MD  buPROPion (WELLBUTRIN SR) 150 MG 12 hr tablet Take 1 tablet (150 mg total) by mouth 2 (two) times daily. 01/28/23   [name], MD  cetirizine (ZYRTEC) 10 MG tablet TAKE 1 TABLET BY MOUTH EVERY DAY Patient taking differently: Take 10 mg by mouth daily. 08/02/19   [name], MD  clotrimazole (LOTRIMIN) 1 % cream APPLY TO THE AFFECTED AREA(S) beneath BOTH breasts TWICE DAILY Patient taking differently: Apply 1 Application topically 2 (two) times daily. 03/08/22   [name], MD  diclofenac Sodium (VOLTAREN) 1 % GEL Apply 4 g topically 4 (four) times daily. 05/05/21   [name], MD  dicyclomine (BENTYL) 10 MG capsule Take 1 capsule (10 mg total) by mouth daily. 02/12/22   [name], MD  fenofibrate (TRICOR) 145 MG tablet Take 1 tablet (145 mg total) by mouth every evening. 01/28/23   [name], MD  FLUoxetine (PROZAC) 20 MG capsule TAKE ONE CAPSULE BY MOUTH EVERY  MORNING 01/28/23   [name], MD  furosemide (LASIX) 20 MG tablet Take 1 tablet (20 mg total) by mouth daily. 11/12/21   [name], MD  gabapentin (NEURONTIN) 300 MG capsule Take 2 capsules (600 mg total) by mouth 2 (two) times daily. 01/28/23   [name], MD  glucose blood (ONETOUCH VERIO) test strip Use as instructed 02/09/22   [name], MD  hydrOXYzine (ATARAX) 25 MG tablet Take 25 mg by mouth 3 (three) times daily. 12/15/22 06/13/23  [provider]  insulin glargine (LANTUS SOLOSTAR) 100 UNIT/ML Solostar Pen Inject 55 Units into the skin at bedtime. 01/28/23   [name], MD  Insulin Pen Needle (COMFORT EZ PEN NEEDLES) 31G X 5 MM MISC USE AS DIRECTED ONCE DAILY 09/16/21   [name], MD  Lancets [facility] DELICA PLUS LANCET33G) MISC Use to test blood sugar 3 times a day 02/09/22   [name], MD  Lancets Misc. (ACCU-CHEK FASTCLIX LANCET) KIT USE AS DIRECTED 11/12/21   [name], MD  lidocaine (LIDODERM) 5 % Place ONE PATCH onto THE SKIN daily. REMOVE & Discard PATCH WITHIN 12 hours OR as directed by MD 12/30/22   [name], MD  lisinopril (ZESTRIL) 40 MG tablet Take 1 tablet (40 mg total) by mouth daily. 01/28/23   [name], MD  metFORMIN (GLUCOPHAGE) 500 MG tablet Take 2 tablets (1,000 mg total) by mouth 2 (two) times daily. TAKE TWO TABLETS BY MOUTH TWICE DAILY WITH A MEAL Strength: 500 mg 01/28/23   [name], MD  methocarbamol (ROBAXIN) 500 MG tablet Take 500 mg by mouth every 8 (eight) hours as needed (for back pain).    [provider]  metoCLOPramide (REGLAN) 10 MG tablet TAKE 1 TABLET BY MOUTH EVERY EIGHT HOURS AS NEEDED FOR NAUSEA OR VOMITING. Patient taking differently: Take 10 mg by mouth every 8 (eight) hours as needed for vomiting or nausea. 08/14/20   Anders Simmonds, PA-C  metoprolol succinate (TOPROL-XL) 25 MG 24 hr tablet Take 1 tablet (25 mg total) by mouth daily. TAKE ONE TABLET BY MOUTH EVERY MORNING  WITH OR  immediately following A meal Strength: 25 mg 01/28/23   [name], MD  Multiple Vitamin (MULTIVITAMIN) tablet Take 1 tablet by mouth daily.    [provider]  mupirocin ointment (BACTROBAN) 2 % Apply 1 Application topically 2 (two) times daily. To affected area till better 03/19/22   Zenia Resides, MD  nitroGLYCERIN (NITROSTAT) 0.4 MG SL tablet Place 0.4 mg under the tongue every 5 (five) minutes as needed for chest pain.    [provider]  oxyCODONE (OXY IR/ROXICODONE) 5 MG immediate release tablet Take 5 mg by mouth every 4 (four) hours as needed for moderate pain. 11/29/22   [provider]  oxyCODONE-acetaminophen (PERCOCET/ROXICET) 5-325 MG tablet Take 1 tablet by mouth every 6 (six) hours as needed for severe pain. 11/16/22   Mardella Layman, MD  pantoprazole (PROTONIX) 40 MG tablet Take 1 tablet (40 mg total) by mouth 2 (two) times daily. 12/23/22   Lewie Chamber, MD  pantoprazole (PROTONIX) 40 MG tablet Take 1 tablet (40 mg total) by mouth daily. 01/23/23   Lewie Chamber, MD  PRALUENT 75 MG/ML SOAJ INJECT 75 MG into THE SKIN every 14 DAYS Patient taking differently: Inject 75 mg as directed every 14 (fourteen) days. 06/16/21   Alver Sorrow, NP  rosuvastatin (CRESTOR) 40 MG tablet Take 1 tablet (40 mg total) by mouth every evening. 01/28/23   [name], MD  Semaglutide, 1 MG/DOSE, 4 MG/3ML SOPN Inject 1 mg as directed once a week. For 4 weeks then increase to 2mg  weekly thereafter Patient taking differently: Inject 1 mg as directed once a week. 02/25/22   [name], MD  Semaglutide, 2 MG/DOSE, 8 MG/3ML SOPN Inject 2 mg as directed once a week. 02/25/22   [name], MD  silver sulfADIAZINE (SILVADENE) 1 % cream Apply 1 Application topically daily. 11/21/22   Lonell Grandchild, MD  sucralfate (CARAFATE) 1 g tablet Take 1 tablet (1 g total) by mouth 4 (four) times daily. 12/23/22 01/22/23  Lewie Chamber, MD  terbinafine (LAMISIL) 250 MG tablet  Please take one a day x 7days, repeat every 4 weeks x 4 months 05/07/21   Lenn Sink, DPM  traZODone (DESYREL) 50 MG tablet TAKE ONE TABLET BY MOUTH EVERYDAY AT BEDTIME 01/28/23   [name], MD  triamcinolone cream (KENALOG) 0.1 % APPLY TO THE AFFECTED AREA(S) ON BOTH LEGS TWICE DAILY Patient taking differently: Apply 1 Application topically See admin instructions. APPLY TO THE AFFECTED AREA(S) ON BOTH LEGS TWICE DAILY 05/10/22   [name], MD  vitamin B-12 (CYANOCOBALAMIN) 500 MCG tablet TAKE ONE TABLET BY MOUTH ONCE DAILY 01/10/23   [name], MD  vitamin C (ASCORBIC ACID) 500 MG tablet Take 500 mg by mouth daily.    [provider]    Current Facility-Administered Medications  Medication Dose Route Frequency Provider Last Rate Last Admin   0.9 %  sodium chloride infusion   Intravenous Continuous Kirby Crigler, Mir M, MD       albuterol (PROVENTIL) (2.5 MG/3ML) 0.083% nebulizer solution 2.5 mg  2.5 mg Nebulization Q2H PRN Kirby Crigler, Mir M, MD       buPROPion [facility] SR) 12 hr tablet 150 mg  150 mg Oral BID Kirby Crigler, Mir M, MD       [START ON 02/04/2023] cefTRIAXone (ROCEPHIN) 2 g in sodium chloride 0.9 % 100 mL IVPB  2 g Intravenous Q24H Kirby Crigler, Mir M, MD       dicyclomine (BENTYL) capsule 10 mg  10  mg Oral Daily Kirby Crigler, Mir M, MD       HYDROmorphone (DILAUDID) injection 0.5-1 mg  0.5-1 mg Intravenous Q2H PRN Kirby Crigler, Mir M, MD       insulin aspart (novoLOG) injection 0-15 Units  0-15 Units Subcutaneous TID WC Kirby Crigler, Mir M, MD       insulin aspart (novoLOG) injection 0-5 Units  0-5 Units Subcutaneous QHS Kirby Crigler, Mir M, MD       insulin glargine-yfgn [facility]) injection 25 Units  25 Units Subcutaneous Daily Kirby Crigler, Mir M, MD       lisinopril (ZESTRIL) tablet 40 mg  40 mg Oral Daily Kirby Crigler, Mir M, MD       loratadine (CLARITIN) tablet 10 mg  10 mg Oral Daily Kirby Crigler, Mir M, MD       metoprolol succinate (TOPROL-XL) 24 hr tablet 25 mg   25 mg Oral Daily Tilden Fossa, MD       [START ON 02/04/2023] metoprolol succinate (TOPROL-XL) 24 hr tablet 25 mg  25 mg Oral Daily Kirby Crigler, Mir M, MD       metroNIDAZOLE (FLAGYL) IVPB 500 mg  500 mg Intravenous Once Tilden Fossa, MD 100 mL/hr at 02/03/23 0930 500 mg at 02/03/23 0930   ondansetron (ZOFRAN) tablet 4 mg  4 mg Oral Q6H PRN Kirby Crigler, Mir M, MD       Or   ondansetron [facility]) injection 4 mg  4 mg Intravenous Q6H PRN Kirby Crigler, Mir M, MD       oxyCODONE (Oxy IR/ROXICODONE) immediate release tablet 5 mg  5 mg Oral Q4H PRN Kirby Crigler, Mir M, MD       pantoprazole (PROTONIX) injection 40 mg  40 mg Intravenous Q24H Kirby Crigler, Mir M, MD       rosuvastatin (CRESTOR) tablet 40 mg  40 mg Oral QPM Kirby Crigler, Mir M, MD       traZODone (DESYREL) tablet 25 mg  25 mg Oral QHS PRN Kirby Crigler, Mir M, MD       Current Outpatient Medications  Medication Sig Dispense Refill   aspirin EC 81 MG tablet Take 81 mg by mouth daily.     Blood Glucose Monitoring Suppl (ONETOUCH VERIO) w/Device KIT Use to test blood sugar 3 times a day 1 kit 0   buPROPion (WELLBUTRIN SR) 150 MG 12 hr tablet Take 1 tablet (150 mg total) by mouth 2 (two) times daily. 180 tablet 0   cetirizine (ZYRTEC) 10 MG tablet TAKE 1 TABLET BY MOUTH EVERY DAY (Patient taking differently: Take 10 mg by mouth daily.) 30 tablet 2   clotrimazole (LOTRIMIN) 1 % cream APPLY TO THE AFFECTED AREA(S) beneath BOTH breasts TWICE DAILY (Patient taking differently: Apply 1 Application topically 2 (two) times daily.) 60 g 1   diclofenac Sodium (VOLTAREN) 1 % GEL Apply 4 g topically 4 (four) times daily. 100 g 1   dicyclomine (BENTYL) 10 MG capsule Take 1 capsule (10 mg total) by mouth daily. 30 capsule 1   fenofibrate (TRICOR) 145 MG tablet Take 1 tablet (145 mg total) by mouth every evening. 90 tablet 0   FLUoxetine (PROZAC) 20 MG capsule TAKE ONE CAPSULE BY MOUTH EVERY MORNING 90 capsule 0   furosemide (LASIX) 20 MG tablet Take 1 tablet  (20 mg total) by mouth daily. 90 tablet 1   gabapentin (NEURONTIN) 300 MG capsule Take 2 capsules (600 mg total) by mouth 2 (two) times daily. 360 capsule 2   glucose blood (ONETOUCH VERIO) test strip Use as instructed 100 each 12  hydrOXYzine (ATARAX) 25 MG tablet Take 25 mg by mouth 3 (three) times daily.     insulin glargine (LANTUS SOLOSTAR) 100 UNIT/ML Solostar Pen Inject 55 Units into the skin at bedtime. 45 mL 6   Insulin Pen Needle (COMFORT EZ PEN NEEDLES) 31G X 5 MM MISC USE AS DIRECTED ONCE DAILY 100 each 0   Lancets (ONETOUCH DELICA PLUS LANCET33G) MISC Use to test blood sugar 3 times a day 100 each 12   Lancets Misc. (ACCU-CHEK FASTCLIX LANCET) KIT USE AS DIRECTED 1 kit 11   lidocaine (LIDODERM) 5 % Place ONE PATCH onto THE SKIN daily. REMOVE & Discard PATCH WITHIN 12 hours OR as directed by MD 30 patch 0   lisinopril (ZESTRIL) 40 MG tablet Take 1 tablet (40 mg total) by mouth daily. 90 tablet 0   metFORMIN (GLUCOPHAGE) 500 MG tablet Take 2 tablets (1,000 mg total) by mouth 2 (two) times daily. TAKE TWO TABLETS BY MOUTH TWICE DAILY WITH A MEAL Strength: 500 mg 360 tablet 0   methocarbamol (ROBAXIN) 500 MG tablet Take 500 mg by mouth every 8 (eight) hours as needed (for back pain).     metoCLOPramide (REGLAN) 10 MG tablet TAKE 1 TABLET BY MOUTH EVERY EIGHT HOURS AS NEEDED FOR NAUSEA OR VOMITING. (Patient taking differently: Take 10 mg by mouth every 8 (eight) hours as needed for vomiting or nausea.) 40 tablet 0   metoprolol succinate (TOPROL-XL) 25 MG 24 hr tablet Take 1 tablet (25 mg total) by mouth daily. TAKE ONE TABLET BY MOUTH EVERY MORNING WITH OR immediately following A meal Strength: 25 mg 90 tablet 0   Multiple Vitamin (MULTIVITAMIN) tablet Take 1 tablet by mouth daily.     mupirocin ointment (BACTROBAN) 2 % Apply 1 Application topically 2 (two) times daily. To affected area till better 22 g 0   nitroGLYCERIN (NITROSTAT) 0.4 MG SL tablet Place 0.4 mg under the tongue every 5  (five) minutes as needed for chest pain.     oxyCODONE (OXY IR/ROXICODONE) 5 MG immediate release tablet Take 5 mg by mouth every 4 (four) hours as needed for moderate pain.     oxyCODONE-acetaminophen (PERCOCET/ROXICET) 5-325 MG tablet Take 1 tablet by mouth every 6 (six) hours as needed for severe pain. 20 tablet 0   pantoprazole (PROTONIX) 40 MG tablet Take 1 tablet (40 mg total) by mouth 2 (two) times daily. 60 tablet 0   pantoprazole (PROTONIX) 40 MG tablet Take 1 tablet (40 mg total) by mouth daily.     PRALUENT 75 MG/ML SOAJ INJECT 75 MG into THE SKIN every 14 DAYS (Patient taking differently: Inject 75 mg as directed every 14 (fourteen) days.) 2 mL 11   rosuvastatin (CRESTOR) 40 MG tablet Take 1 tablet (40 mg total) by mouth every evening. 90 tablet 1   Semaglutide, 1 MG/DOSE, 4 MG/3ML SOPN Inject 1 mg as directed once a week. For 4 weeks then increase to 2mg  weekly thereafter (Patient taking differently: Inject 1 mg as directed once a week.) 3 mL 0   Semaglutide, 2 MG/DOSE, 8 MG/3ML SOPN Inject 2 mg as directed once a week. 3 mL 6   silver sulfADIAZINE (SILVADENE) 1 % cream Apply 1 Application topically daily. 50 g 0   sucralfate (CARAFATE) 1 g tablet Take 1 tablet (1 g total) by mouth 4 (four) times daily. 120 tablet 0   terbinafine (LAMISIL) 250 MG tablet Please take one a day x 7days, repeat every 4 weeks x 4 months 28 tablet  0   traZODone (DESYREL) 50 MG tablet TAKE ONE TABLET BY MOUTH EVERYDAY AT BEDTIME 90 tablet 0   triamcinolone cream (KENALOG) 0.1 % APPLY TO THE AFFECTED AREA(S) ON BOTH LEGS TWICE DAILY (Patient taking differently: Apply 1 Application topically See admin instructions. APPLY TO THE AFFECTED AREA(S) ON BOTH LEGS TWICE DAILY) 45 g 0   vitamin B-12 (CYANOCOBALAMIN) 500 MCG tablet TAKE ONE TABLET BY MOUTH ONCE DAILY 90 tablet 1   vitamin C (ASCORBIC ACID) 500 MG tablet Take 500 mg by mouth daily.      Allergies as of 02/03/2023 - Review Complete 02/03/2023  Allergen  Reaction Noted   Cymbalta [duloxetine hcl] Nausea Only and Other (See Comments) 11/12/2013    Social History   Socioeconomic History   Marital status: Widowed    Spouse name: Not on file   Number of children: Not on file   Years of education: Not on file   Highest education level: Not on file  Occupational History   Not on file  Tobacco Use   Smoking status: Every Day    Current packs/day: 0.00    Average packs/day: 1 pack/day for 41.0 years (41.0 ttl pk-yrs)    Types: Cigarettes    Start date: 10/06/1977    Last attempt to quit: 10/07/2018    Years since quitting: 4.3   Smokeless tobacco: Never  Vaping Use   Vaping status: Never Used  Substance and Sexual Activity   Alcohol use: No   Drug use: No   Sexual activity: Never    Birth control/protection: None  Other Topics Concern   Not on file  Social History Narrative   Lives in [place] with husband.  Unemployed.  Does not routinely exercise.   Social Determinants of Health   Financial Resource Strain: Low Risk  (02/01/2023)   Overall Financial Resource Strain (CARDIA)    Difficulty of Paying Living Expenses: Not hard at all  Food Insecurity: No Food Insecurity (02/01/2023)   Hunger Vital Sign    Worried About Running Out of Food in the Last Year: Never true    Ran Out of Food in the Last Year: Never true  Transportation Needs: No Transportation Needs (02/01/2023)   PRAPARE - Administrator, Civil Service (Medical): No    Lack of Transportation (Non-Medical): No  Physical Activity: Insufficiently Active (02/01/2023)   Exercise Vital Sign    Days of Exercise per Week: 2 days    Minutes of Exercise per Session: 20 min  Stress: No Stress Concern Present (02/01/2023)   [employer] of Occupational Health - Occupational Stress Questionnaire    Feeling of Stress : Not at all  Social Connections: Socially Isolated (02/01/2023)   Social Connection and Isolation Panel [NHANES]    Frequency of Communication  with Friends and Family: More than three times a week    Frequency of Social Gatherings with Friends and Family: Three times a week    Attends Religious Services: Never    Active Member of Clubs or Organizations: No    Attends Banker Meetings: Never    Marital Status: Widowed  Intimate Partner Violence: Not At Risk (02/01/2023)   Humiliation, Afraid, Rape, and Kick questionnaire    Fear of Current or Ex-Partner: No    Emotionally Abused: No    Physically Abused: No    Sexually Abused: No     Code Status   Code Status: Full Code  Review of Systems: All systems reviewed and negative  except where noted in HPI.  Physical Exam: Vital signs in last 24 hours: Temp:  [97.9 F (36.6 C)-98.1 F (36.7 C)] 98.1 F (36.7 C) (09/12 0741) Pulse Rate:  [93-104] 102 (09/12 0830) Resp:  [16-18] 16 (09/12 0741) BP: (176-213)/(102-159) 176/110 (09/12 0830) SpO2:  [94 %-98 %] 94 % (09/12 0830)    General:  Pleasant female in NAD Psych:  Cooperative. Normal mood and affect Eyes: Pupils equal Ears:  Normal auditory acuity Nose: No deformity, discharge or lesions Neck:  Supple, no masses felt Lungs:  Clear to auscultation.  Heart:  Regular rate, regular rhythm.  Abdomen:  Soft, nondistended, lower abdominal tenderness, hyperactive bowel sounds, no masses felt Msk: Symmetrical without gross deformities.  Neurologic:  Alert, oriented, grossly normal neurologically Extremities : No edema Skin:  skin graft and scabbed areas noted to right leg and left knee   Intake/Output from previous day: No intake/output data recorded. Intake/Output this shift:  No intake/output data recorded.  Principal Problem:   Colitis with rectal bleeding    Sarah Costanza, NP-C @  02/03/2023, 9:32 AM

## 2023-02-04 ENCOUNTER — Ambulatory Visit: Payer: Self-pay

## 2023-02-04 DIAGNOSIS — R933 Abnormal findings on diagnostic imaging of other parts of digestive tract: Secondary | ICD-10-CM

## 2023-02-04 DIAGNOSIS — R197 Diarrhea, unspecified: Secondary | ICD-10-CM

## 2023-02-04 DIAGNOSIS — K5100 Ulcerative (chronic) pancolitis without complications: Secondary | ICD-10-CM | POA: Diagnosis not present

## 2023-02-04 DIAGNOSIS — K625 Hemorrhage of anus and rectum: Secondary | ICD-10-CM

## 2023-02-04 DIAGNOSIS — K529 Noninfective gastroenteritis and colitis, unspecified: Secondary | ICD-10-CM | POA: Diagnosis not present

## 2023-02-04 LAB — BASIC METABOLIC PANEL
Anion gap: 9 (ref 5–15)
BUN: 13 mg/dL (ref 8–23)
CO2: 23 mmol/L (ref 22–32)
Calcium: 8.5 mg/dL — ABNORMAL LOW (ref 8.9–10.3)
Chloride: 105 mmol/L (ref 98–111)
Creatinine, Ser: <0.3 mg/dL — ABNORMAL LOW (ref 0.44–1.00)
Glucose, Bld: 103 mg/dL — ABNORMAL HIGH (ref 70–99)
Potassium: 2.7 mmol/L — CL (ref 3.5–5.1)
Sodium: 137 mmol/L (ref 135–145)

## 2023-02-04 LAB — GLUCOSE, CAPILLARY
Glucose-Capillary: 113 mg/dL — ABNORMAL HIGH (ref 70–99)
Glucose-Capillary: 194 mg/dL — ABNORMAL HIGH (ref 70–99)
Glucose-Capillary: 257 mg/dL — ABNORMAL HIGH (ref 70–99)
Glucose-Capillary: 126 mg/dL — ABNORMAL HIGH (ref 70–99)

## 2023-02-04 LAB — CBC
HCT: 37 % (ref 36.0–46.0)
Hemoglobin: 12.2 g/dL (ref 12.0–15.0)
MCH: 29.4 pg (ref 26.0–34.0)
MCHC: 33 g/dL (ref 30.0–36.0)
MCV: 89.2 fL (ref 80.0–100.0)
Platelets: 242 10*3/uL (ref 150–400)
RBC: 4.15 MIL/uL (ref 3.87–5.11)
RDW: 13.8 % (ref 11.5–15.5)
WBC: 12.4 10*3/uL — ABNORMAL HIGH (ref 4.0–10.5)
nRBC: 0 % (ref 0.0–0.2)

## 2023-02-04 LAB — POTASSIUM: Potassium: 3.5 mmol/L (ref 3.5–5.1)

## 2023-02-04 LAB — HEMOGLOBIN A1C
Hgb A1c MFr Bld: 10.7 % — ABNORMAL HIGH (ref 4.8–5.6)
Mean Plasma Glucose: 260 mg/dL

## 2023-02-04 LAB — MAGNESIUM: Magnesium: 1.6 mg/dL — ABNORMAL LOW (ref 1.7–2.4)

## 2023-02-04 MED ORDER — POTASSIUM CHLORIDE CRYS ER 20 MEQ PO TBCR
40.0000 meq | EXTENDED_RELEASE_TABLET | Freq: Once | ORAL | Status: AC
  Administered 2023-02-04: 40 meq via ORAL
  Filled 2023-02-04: qty 2

## 2023-02-04 MED ORDER — POTASSIUM CHLORIDE CRYS ER 20 MEQ PO TBCR
40.0000 meq | EXTENDED_RELEASE_TABLET | Freq: Two times a day (BID) | ORAL | Status: AC
  Administered 2023-02-04 (×2): 40 meq via ORAL
  Filled 2023-02-04 (×2): qty 2

## 2023-02-04 MED ORDER — METRONIDAZOLE 500 MG PO TABS
500.0000 mg | ORAL_TABLET | Freq: Two times a day (BID) | ORAL | Status: DC
  Administered 2023-02-04 – 2023-02-06 (×4): 500 mg via ORAL
  Filled 2023-02-04 (×4): qty 1

## 2023-02-04 MED ORDER — LIVING WELL WITH DIABETES BOOK
Freq: Once | Status: AC
  Filled 2023-02-04: qty 1

## 2023-02-04 NOTE — Progress Notes (Addendum)
PROGRESS NOTE    Sarah Charles  ZOX:[id number] DOB: December 09, 1960 DOA: 02/03/2023 PCP: [name], MD   Brief Narrative: Sarah Charles is a 62 y.o. female with a history of diabetes mellitus type 2, GERD, hyperlipidemia, hypertension.  Patient presented secondary to abdominal pain and diarrhea and was found to have evidence of pancolitis, presumed infectious. GI consulted. Patient started empirically on Ceftriaxone and Flagyl. C. Difficile negative. GI pathogen panel pending.   Assessment and Plan:  Pancolitis Diarrhea Noted on CT imaging. Concern for possible infectious etiology. Patient started empirically on Ceftriaxone and Flagyl for treatment. GI consulted. C. Difficile testing negative. GI pathogen panel pending. -Continue Ceftriaxone -Resume Flagyl -Follow-up GI pathogen panel  Rectal bleeding Likely secondary to colitis. GI consulted. Hemoglobin stable.  Hypokalemia Potassium of 2.7 this morning. Patient given potassium supplementation -Recheck potassium  Diabetes mellitus type 2 Uncontrolled with hyperglycemia. Hemoglobin A1C of 10.7%. -Continue Semglee 25 units daily and SSI  Primary hypertension -Continue metoprolol and lisinopril  Depression -Continue Wellbutrin  Hyperlipidemia -Continue Crestor   DVT prophylaxis: SCDs Code Status:   Code Status: Full Code Family Communication: None at bedside Disposition Plan: Discharge home likely in 2-3 days pending continued workup of pancolitis   Consultants:  [facility]  Procedures:  None  Antimicrobials: Ceftriaxone    Subjective: Patient reports diarrhea and prior bloody stools. Nothing this morning.  Objective: BP (!) 162/78 (BP Location: Right Arm)   Pulse 64   Temp 97.7 F (36.5 C) (Oral)   Resp 18   SpO2 97%   Examination:  General exam: Appears calm and comfortable Respiratory system: Respiratory effort normal. Gastrointestinal system: Abdomen is nondistended, soft and  non-tender. Central nervous system: Alert and oriented. No focal neurological deficits. Musculoskeletal: No edema. No calf tenderness Psychiatry: Judgement and insight appear normal. Mood & affect appropriate.    Data Reviewed: I have personally reviewed following labs and imaging studies  CBC Lab Results  Component Value Date   WBC 12.4 (H) 02/04/2023   RBC 4.15 02/04/2023   HGB 12.2 02/04/2023   HCT 37.0 02/04/2023   MCV 89.2 02/04/2023   MCH 29.4 02/04/2023   PLT 242 02/04/2023   MCHC 33.0 02/04/2023   RDW 13.8 02/04/2023   LYMPHSABS 1.0 02/03/2023   MONOABS 0.6 02/03/2023   EOSABS 0.0 02/03/2023   BASOSABS 0.0 02/03/2023     Last metabolic panel Lab Results  Component Value Date   NA 137 02/04/2023   K 2.7 (LL) 02/04/2023   CL 105 02/04/2023   CO2 23 02/04/2023   BUN 13 02/04/2023   CREATININE <0.30 (L) 02/04/2023   GLUCOSE 103 (H) 02/04/2023   GFRNONAA NOT CALCULATED 02/04/2023   GFRAA >60 12/10/2019   CALCIUM 8.5 (L) 02/04/2023   PHOS 3.8 12/22/2022   PROT 7.9 02/03/2023   ALBUMIN 3.8 02/03/2023   LABGLOB 2.6 02/25/2022   AGRATIO 1.6 02/25/2022   BILITOT 0.5 02/03/2023   ALKPHOS 129 (H) 02/03/2023   AST 15 02/03/2023   ALT 15 02/03/2023   ANIONGAP 9 02/04/2023    GFR: CrCl cannot be calculated (This lab value cannot be used to calculate CrCl because it is not a number: <0.30).  Recent Results (from the past 240 hour(s))  C Difficile Quick Screen w PCR reflex     Status: None   Collection Time: 02/03/23  9:09 AM   Specimen: STOOL  Result Value Ref Range Status   C Diff antigen NEGATIVE NEGATIVE Final   C Diff toxin NEGATIVE NEGATIVE  Final   C Diff interpretation No C. difficile detected.  Final    Comment: Performed at [facility], [address], [place], [place] [address]      Radiology Studies: CT ABDOMEN PELVIS W CONTRAST  Result Date: 02/03/2023 CLINICAL DATA:  LLQ abdominal pain lower abdominal pain, V/D EXAM: CT  ABDOMEN AND PELVIS WITH CONTRAST TECHNIQUE: Multidetector CT imaging of the abdomen and pelvis was performed using the standard protocol following bolus administration of intravenous contrast. RADIATION DOSE REDUCTION: This exam was performed according to the departmental dose-optimization program which includes automated exposure control, adjustment of the mA and/or kV according to patient size and/or use of iterative reconstruction technique. CONTRAST:  OMNIPAQUE IOHEXOL 300 MG/ML  SOLN COMPARISON:  None Available. FINDINGS: Lower chest: No acute abnormality. Hepatobiliary: No focal liver abnormality. Status post cholecystectomy. No biliary dilatation. Pancreas: No focal lesion. Normal pancreatic contour. No surrounding inflammatory changes. No main pancreatic ductal dilatation. Spleen: Normal in size without focal abnormality. Adrenals/Urinary Tract: No adrenal nodule bilaterally. Bilateral kidneys enhance symmetrically. Subcentimeter hypodensities are too small to characterize-no further follow-up indicated. No hydronephrosis. No hydroureter. The urinary bladder is unremarkable. On delayed imaging, there is no urothelial wall thickening and there are no filling defects in the opacified portions of the bilateral collecting systems or ureters. Stomach/Bowel: Stomach is within normal limits. No evidence of small bowel wall thickening or dilatation. Third portion of the duodenum diverticula. Diffuse colonic collapse with associated bowel wall thickening and mucosal hyperemia. Single sigmoid colonic diverticula. Appendix appears normal. Vascular/Lymphatic: No abdominal aorta or iliac aneurysm. Severe atherosclerotic plaque of the aorta and its branches. No abdominal, pelvic, or inguinal lymphadenopathy. Reproductive: Uterus and bilateral adnexa are unremarkable. Other: No intraperitoneal free fluid. No intraperitoneal free gas. No organized fluid collection. Musculoskeletal: No abdominal wall hernia or  abnormality. No suspicious lytic or blastic osseous lesions. No acute displaced fracture. Multilevel degenerative changes of the spine. Multilevel intervertebral disc space vacuum phenomenon. Endplate sclerosis at the L1-L2 and L5-S1 levels. Old L3 transverse process fractures versus congenital nonunion. IMPRESSION: 1. Pancolitis. 2. Single sigmoid colonic diverticula.  No acute diverticulitis. 3.  Aortic Atherosclerosis (ICD10-I70.0). Electronically Signed   By: Tish Frederickson M.D.   On: 02/03/2023 07:19      LOS: 1 day    Jacquelin Hawking, MD [facility] 02/04/2023, 2:42 PM   If 7PM-7AM, please contact night-coverage [email or web address]

## 2023-02-04 NOTE — Progress Notes (Signed)
   02/04/23 0533  Provider Notification  Provider Name/Title Ruffin Pyo NP  Date Provider Notified 02/04/23  Time Provider Notified [phone]  Method of Notification  (secure chat)  Notification Reason Critical Result  Test performed and critical result Potassium 2.7  Date Critical Result Received 02/04/23  Time Critical Result Received 0528  Provider response See new orders  Date of Provider Response 02/04/23  Time of Provider Response [phone]

## 2023-02-04 NOTE — Progress Notes (Signed)
Daily Progress Note  DOA: 02/03/2023 Hospital Day: 2  Chief Complaint: Pancolitis with rectal bleeding   ASSESSMENT & PLAN   Brief Narrative:  Sarah Charles is a 62 y.o. year old female with a history of poorly controlled insulin-dependent type 2 diabetes, CAD post stent placement (2019), chronic tobacco use, GERD, hyperlipidemia, hypertension admitted to the hospital with pancolitis.    Pancolitis with rectal bleeding (acute) 62 year old female pt presented with acute episode of abdominal pain with diarrhea. No known hx of previous episode. No family hx. Last colonoscopy in 2012.Potentially bacterial/infectious colitis, without evidence of abscess. She is hemodynamically stable and not septic. Afebrile. WBC 13.0> 12.4. Hgb 15.6>12.2. Back to baseline. Received IV Rocephin and Flagyl in ER. CT scan shows pancolitis. Diverticulosis without diverticulitis.Cdiff stool studies negative. CRP 8.4. ESR 4.  -Monitor H/H -Transfuse if HGB <7 - Continue Clear liquid diet as tolerated -On Rocephin 1 gram IV -Pending GI panel, collected yesterday -if she has a negative infectious workup and does not have clinical improvement within the next 48 to 72 hours, an endoscopic evaluation will be considered.  Magnesium deficiency. 1.6 today.  Hypokalemia. 3.6>2.7. Pt received Potassium 40 MEQ po. -Continue to monitor  GERD with hx of esophagitis. Last EGD with Dr. Marca Ancona (12/21/22) during hospitalization where she had esophageal ulcers and Grade D eosphagitis. Discharged home with PPI therapy BID and sucralfate. Pt reports completing regimen.Denies GERD symptoms. -Continue Pantoprazole 40 mg IV   Type 2 diabetes-very poorly controlled, was admitted to the hospital about 6 weeks ago for DKA. Glucose 353 on admittance. - on SSI per hospitalist   CAD with stents in 2019 (on ASA). Last dose 9/12 morning   VTE -recommend Ted hose and SCDs   Additional co-morbidities include :Hypertension-on metoprolol  and lisinopril,Depression-  on Wellbutrin, Hyperlipidemia-  on Crestor   Subjective   Patient lying in bed, just finished clear liquid diet. No reports of diarrhea, nausea, or abdominal cramping since yesterday morning. No blood per rectum. No fever or chills. No CP, SOB, or dizziness. Overall, patient states she is feeling much better. No new complaints.  Objective   CT ABDOMEN AND PELVIS WITH CONTRAST  (02/03/2023)   IMPRESSION: 1. Pancolitis. 2. Single sigmoid colonic diverticula.  No acute diverticulitis. 3.  Aortic Atherosclerosis (ICD10-I70.0).   UPPER ENDOSCOPY (12/21/2022)   Impression:  - Esophageal ulcers. Biopsied.  - LA Grade D esophagitis with no bleeding. - Erythematous mucosa in the stomach. Biopsied.  - Non- bleeding duodenal ulcers with pigmented material.   Per Dr. Milas Hock note: She had a colonoscopy in 2012 by Dr. Matthias Hughs at [facility] in which hyperplastic polyp was removed. She did not have any adenomas.    EGD with DIL (06/2001) Endoscopy with esophageal dilatation by Dr. Melvia Heaps for dysphagia and reflux symptoms.   Assessment revealed abnormal examination showing esophagitis grade a.  Dilatation for dysphagia without stricture.   Labs and Imaging:   ECHO (10/2021)   Left ventricular ejection fraction, by estimation, is 70 to 75%.     Recent Labs    02/03/23 0406 02/04/23 0446  WBC 13.0* 12.4*  HGB 15.6* 12.2  HCT 46.8* 37.0  PLT 259 242   BMET Recent Labs    02/03/23 0406 02/04/23 0446  NA 135 137  K 3.6 2.7*  CL 100 105  CO2 24 23  GLUCOSE 353* 103*  BUN 26* 13  CREATININE 0.60 <0.30*  CALCIUM 9.7 8.5*   LFT Recent Labs  02/03/23 0406  PROT 7.9  ALBUMIN 3.8  AST 15  ALT 15  ALKPHOS 129*  BILITOT 0.5   PT/INR No results for input(s): "LABPROT", "INR" in the last 72 hours.   Imaging:  CT ABDOMEN PELVIS W CONTRAST CLINICAL DATA:  LLQ abdominal pain lower abdominal pain, V/D  EXAM: CT ABDOMEN AND PELVIS WITH  CONTRAST  TECHNIQUE: Multidetector CT imaging of the abdomen and pelvis was performed using the standard protocol following bolus administration of intravenous contrast.  RADIATION DOSE REDUCTION: This exam was performed according to the departmental dose-optimization program which includes automated exposure control, adjustment of the mA and/or kV according to patient size and/or use of iterative reconstruction technique.  CONTRAST:  OMNIPAQUE IOHEXOL 300 MG/ML  SOLN  COMPARISON:  None Available.  FINDINGS: Lower chest: No acute abnormality.  Hepatobiliary: No focal liver abnormality. Status post cholecystectomy. No biliary dilatation.  Pancreas: No focal lesion. Normal pancreatic contour. No surrounding inflammatory changes. No main pancreatic ductal dilatation.  Spleen: Normal in size without focal abnormality.  Adrenals/Urinary Tract:  No adrenal nodule bilaterally.  Bilateral kidneys enhance symmetrically. Subcentimeter hypodensities are too small to characterize-no further follow-up indicated.  No hydronephrosis. No hydroureter.  The urinary bladder is unremarkable.  On delayed imaging, there is no urothelial wall thickening and there are no filling defects in the opacified portions of the bilateral collecting systems or ureters.  Stomach/Bowel: Stomach is within normal limits. No evidence of small bowel wall thickening or dilatation. Third portion of the duodenum diverticula. Diffuse colonic collapse with associated bowel wall thickening and mucosal hyperemia. Single sigmoid colonic diverticula. Appendix appears normal.  Vascular/Lymphatic: No abdominal aorta or iliac aneurysm. Severe atherosclerotic plaque of the aorta and its branches. No abdominal, pelvic, or inguinal lymphadenopathy.  Reproductive: Uterus and bilateral adnexa are unremarkable.  Other: No intraperitoneal free fluid. No intraperitoneal free gas. No organized fluid  collection.  Musculoskeletal:  No abdominal wall hernia or abnormality.  No suspicious lytic or blastic osseous lesions. No acute displaced fracture. Multilevel degenerative changes of the spine. Multilevel intervertebral disc space vacuum phenomenon. Endplate sclerosis at the L1-L2 and L5-S1 levels. Old L3 transverse process fractures versus congenital nonunion.  IMPRESSION: 1. Pancolitis. 2. Single sigmoid colonic diverticula.  No acute diverticulitis. 3.  Aortic Atherosclerosis (ICD10-I70.0).  Electronically Signed   By: Tish Frederickson M.D.   On: 02/03/2023 07:19     Scheduled inpatient medications:   buPROPion  150 mg Oral BID   dicyclomine  10 mg Oral Daily   influenza vac split trivalent PF  0.5 mL Intramuscular Tomorrow-1000   insulin aspart  0-15 Units Subcutaneous TID WC   insulin aspart  0-5 Units Subcutaneous QHS   insulin glargine-yfgn  25 Units Subcutaneous Daily   lisinopril  40 mg Oral Daily   loratadine  10 mg Oral Daily   metoprolol succinate  25 mg Oral Daily   pantoprazole (PROTONIX) IV  40 mg Intravenous Q24H   potassium chloride  40 mEq Oral BID   rosuvastatin  40 mg Oral QPM   Continuous inpatient infusions:   sodium chloride 75 mL/hr at 02/03/23 2349   cefTRIAXone (ROCEPHIN)  IV 2 g (02/04/23 0814)   PRN inpatient medications: albuterol, HYDROmorphone (DILAUDID) injection, ondansetron **OR** ondansetron (ZOFRAN) IV, oxyCODONE, traZODone  Vital signs in last 24 hours: Temp:  [98.3 F (36.8 C)-98.8 F (37.1 C)] 98.8 F (37.1 C) (09/13 0542) Pulse Rate:  [68-94] 68 (09/13 0542) Resp:  [15-20] 17 (09/13 0542) BP: (126-167)/(72-110) 151/72 (  09/13 0542) SpO2:  [95 %-99 %] 96 % (09/13 0542) Last BM Date : 02/03/23  Intake/Output Summary (Last 24 hours) at 02/04/2023 1123 Last data filed at 02/04/2023 1000 Gross per 24 hour  Intake 1742.98 ml  Output 900 ml  Net 842.98 ml    Intake/Output from previous day: 09/12 0701 - 09/13 0700 In:  1149.3 [P.O.:460; I.V.:588.5; IV Piggyback:100.8] Out: 650 [Urine:650] Intake/Output this shift: Total I/O In: 593.7 [P.O.:120; I.V.:373.7; IV Piggyback:100] Out: 250 [Urine:250]   Physical Exam:  General: Alert female in NAD Heart:  Regular rate and rhythm.  Pulmonary: Normal respiratory effort Abdomen: Soft,obese, nondistended, nontender. Normal bowel sounds. Extremities: No lower extremity edema  Neurologic: Alert and oriented Psych: Pleasant. Cooperative. Insight appears normal.  Skin: Tegaderm dressings to right leg.   Principal Problem:   Colitis with rectal bleeding Active Problems:   Pancolitis (HCC)   Rectal bleeding     LOS: 1 day   Margarite Gouge Nakaya Mishkin ,NP 02/04/2023, 11:23 AM

## 2023-02-04 NOTE — Progress Notes (Signed)
   02/04/23 1047  TOC Brief Assessment  Insurance and Status Reviewed  Patient has primary care physician Yes  Home environment has been reviewed home alone  Prior level of function: independent  Social Determinants of Health Reivew SDOH reviewed no interventions necessary  Readmission risk has been reviewed Yes  Transition of care needs no transition of care needs at this time

## 2023-02-04 NOTE — Patient Outreach (Signed)
Care Coordination   Follow Up Visit Note   02/04/2023 Name: Dmya Hrubes MRN: [id number] DOB: 1961/04/01  Fiona Legg is a 62 y.o. year old female who sees [name], MD for primary care. I reviewed patient's chart today in preparation to contact patient for a nurse care coordination follow up call.   What matters to the patients health and wellness today?  N/a    Goals Addressed             This Visit's Progress    RN Care Coordination Activities: further follow up needed       Care Coordination Interventions: Reviewed chart in preparation to contact patient, noted patient is currently at [facility], dx: colitis with rectal bleeding with a pending admission          SDOH assessments and interventions completed:  No     Care Coordination Interventions:  Yes, provided   Follow up plan:  pending patient's discharge home    Encounter Outcome:  Patient Visit Completed

## 2023-02-04 NOTE — Inpatient Diabetes Management (Signed)
Inpatient Diabetes Program Recommendations  AACE/ADA: New Consensus Statement on Inpatient Glycemic Control (2015)  Target Ranges:  Prepandial:   less than 140 mg/dL      Peak postprandial:   less than 180 mg/dL (1-2 hours)      Critically ill patients:  140 - 180 mg/dL   Lab Results  Component Value Date   GLUCAP 194 (H) 02/04/2023   HGBA1C 10.7 (H) 02/03/2023    Review of Glycemic Control  Latest Reference Range & Units 02/03/23 09:46 02/03/23 12:19 02/03/23 16:34 02/03/23 22:55 02/04/23 07:28 02/04/23 11:29  Glucose-Capillary 70 - 99 mg/dL 010 (H) 272 (H) 536 (H) 175 (H) 113 (H) 194 (H)  (H): Data is abnormally high  Diabetes history: DM2 Outpatient Diabetes medications: Lantus 55 units every day, Ozempic weekly, Metformin 1 gm BID Current orders for Inpatient glycemic control: Semglee 25 units every day, Novolog 0-15 units TID  Met with patient at bedside.  Reviewed patient's current A1c of 10.7% (average BG of 260 mg/dL).  Explained what a A1c is and what it measures. Also reviewed goal A1c with patient, importance of good glucose control @ home, and blood sugar goals.  She does not skip doses and denies difficulty obtaining medications.  She checks her BG daily.  It has been running in the mid 200's.  She admits to drinking regular Dr. Reino Kent mostly.  Encouraged her to switch to non-caloric beverages.  She states she cannot drink diet drinks.   Educated on The Plate Method, CHO's, portion control, CBGs at home fasting and mid afternoon, F/U with PCP every 3 months, bring meter to PCP office, long and short term complications of uncontrolled BG, and importance of exercise.  Ordered the Living Well with Diabetes booklet.  Will continue to follow while inpatient.  Thank you, Dulce Sellar, MSN, CDCES Diabetes Coordinator Inpatient Diabetes Program [phone] (team pager from 8a-5p)

## 2023-02-04 NOTE — Hospital Course (Signed)
Sarah Charles is a 62 y.o. female with a history of diabetes mellitus type 2, GERD, hyperlipidemia, hypertension.  Patient presented secondary to abdominal pain and diarrhea and was found to have evidence of pancolitis, presumed infectious. GI consulted. Patient started empirically on Ceftriaxone and Flagyl. C. Difficile negative. GI pathogen panel pending.

## 2023-02-05 DIAGNOSIS — D62 Acute posthemorrhagic anemia: Secondary | ICD-10-CM

## 2023-02-05 DIAGNOSIS — K625 Hemorrhage of anus and rectum: Secondary | ICD-10-CM | POA: Diagnosis not present

## 2023-02-05 DIAGNOSIS — K5100 Ulcerative (chronic) pancolitis without complications: Secondary | ICD-10-CM | POA: Diagnosis not present

## 2023-02-05 DIAGNOSIS — K529 Noninfective gastroenteritis and colitis, unspecified: Secondary | ICD-10-CM | POA: Diagnosis not present

## 2023-02-05 LAB — CBC
HCT: 34.9 % — ABNORMAL LOW (ref 36.0–46.0)
Hemoglobin: 11.2 g/dL — ABNORMAL LOW (ref 12.0–15.0)
MCH: 29.2 pg (ref 26.0–34.0)
MCHC: 32.1 g/dL (ref 30.0–36.0)
MCV: 91.1 fL (ref 80.0–100.0)
Platelets: 205 10*3/uL (ref 150–400)
RBC: 3.83 MIL/uL — ABNORMAL LOW (ref 3.87–5.11)
RDW: 13.7 % (ref 11.5–15.5)
WBC: 9.8 10*3/uL (ref 4.0–10.5)
nRBC: 0 % (ref 0.0–0.2)

## 2023-02-05 LAB — GASTROINTESTINAL PANEL BY PCR, STOOL (REPLACES STOOL CULTURE)

## 2023-02-05 LAB — GLUCOSE, CAPILLARY
Glucose-Capillary: 102 mg/dL — ABNORMAL HIGH (ref 70–99)
Glucose-Capillary: 221 mg/dL — ABNORMAL HIGH (ref 70–99)
Glucose-Capillary: 109 mg/dL — ABNORMAL HIGH (ref 70–99)
Glucose-Capillary: 203 mg/dL — ABNORMAL HIGH (ref 70–99)

## 2023-02-05 LAB — BASIC METABOLIC PANEL
Anion gap: 9 (ref 5–15)
BUN: 6 mg/dL — ABNORMAL LOW (ref 8–23)
CO2: 22 mmol/L (ref 22–32)
Calcium: 8.3 mg/dL — ABNORMAL LOW (ref 8.9–10.3)
Chloride: 107 mmol/L (ref 98–111)
Creatinine, Ser: 0.42 mg/dL — ABNORMAL LOW (ref 0.44–1.00)
GFR, Estimated: >60 mL/min (ref >60)
Glucose, Bld: 105 mg/dL — ABNORMAL HIGH (ref 70–99)
Potassium: 3.7 mmol/L (ref 3.5–5.1)
Sodium: 138 mmol/L (ref 135–145)

## 2023-02-05 LAB — PROTIME-INR
INR: 1.1 (ref 0.8–1.2)
Prothrombin Time: 14.5 s (ref 11.4–15.2)

## 2023-02-05 MED ORDER — PANTOPRAZOLE SODIUM 40 MG PO TBEC
40.0000 mg | DELAYED_RELEASE_TABLET | Freq: Every day | ORAL | Status: DC
  Administered 2023-02-06: 40 mg via ORAL
  Filled 2023-02-05: qty 1

## 2023-02-05 MED ORDER — HYOSCYAMINE SULFATE 0.125 MG SL SUBL
0.1250 mg | SUBLINGUAL_TABLET | Freq: Three times a day (TID) | SUBLINGUAL | Status: DC | PRN
  Administered 2023-02-05: 0.125 mg via SUBLINGUAL
  Filled 2023-02-05: qty 1

## 2023-02-05 NOTE — Progress Notes (Signed)
PROGRESS NOTE    Sarah Gruis  ZOX:[id number] DOB: 15-Oct-1960 DOA: 02/03/2023 PCP: [name], MD   Brief Narrative: Sarah Charles is a 62 y.o. female with a history of diabetes mellitus type 2, GERD, hyperlipidemia, hypertension.  Patient presented secondary to abdominal pain and diarrhea and was found to have evidence of pancolitis, presumed infectious. GI consulted. Patient started empirically on Ceftriaxone and Flagyl. C. Difficile negative. GI pathogen panel pending.   Assessment and Plan:  Pancolitis Diarrhea Noted on CT imaging. Concern for possible infectious etiology. Patient started empirically on Ceftriaxone and Flagyl for treatment. GI consulted. C. Difficile testing negative. GI pathogen panel pending. -Continue Ceftriaxone and Flagyl -Follow-up GI pathogen panel  Rectal bleeding Likely secondary to colitis. GI consulted. Hemoglobin stable.  Hypokalemia Potassium of 2.7 this morning. Patient given potassium supplementation. Resolved.  Diabetes mellitus type 2 Uncontrolled with hyperglycemia. Hemoglobin A1C of 10.7%. -Continue Semglee 25 units daily and SSI  Primary hypertension -Continue metoprolol and lisinopril  Depression -Continue Wellbutrin  Hyperlipidemia -Continue Crestor   DVT prophylaxis: SCDs Code Status:   Code Status: Full Code Family Communication: None at bedside Disposition Plan: Discharge home likely in 1-2 days pending continued workup/treatment of pancolitis   Consultants:  [facility]  Procedures:  None  Antimicrobials: Ceftriaxone Flagyl   Subjective: Continued bloody stools. No other concerns.  Objective: BP (!) 149/72   Pulse 66   Temp 97.6 F (36.4 C) (Oral)   Resp 16   SpO2 98%   Examination:  General exam: Appears calm and comfortable Respiratory system: Clear to auscultation. Respiratory effort normal. Cardiovascular system: S1 & S2 heard, RRR. Gastrointestinal system: Abdomen is  nondistended, soft and nontender. Normal bowel sounds heard. Central nervous system: Alert and oriented. No focal neurological deficits. Psychiatry: Judgement and insight appear normal. Mood & affect appropriate.    Data Reviewed: I have personally reviewed following labs and imaging studies  CBC Lab Results  Component Value Date   WBC 9.8 02/05/2023   RBC 3.83 (L) 02/05/2023   HGB 11.2 (L) 02/05/2023   HCT 34.9 (L) 02/05/2023   MCV 91.1 02/05/2023   MCH 29.2 02/05/2023   PLT 205 02/05/2023   MCHC 32.1 02/05/2023   RDW 13.7 02/05/2023   LYMPHSABS 1.0 02/03/2023   MONOABS 0.6 02/03/2023   EOSABS 0.0 02/03/2023   BASOSABS 0.0 02/03/2023     Last metabolic panel Lab Results  Component Value Date   NA 138 02/05/2023   K 3.7 02/05/2023   CL 107 02/05/2023   CO2 22 02/05/2023   BUN 6 (L) 02/05/2023   CREATININE 0.42 (L) 02/05/2023   GLUCOSE 105 (H) 02/05/2023   GFRNONAA >60 02/05/2023   GFRAA >60 12/10/2019   CALCIUM 8.3 (L) 02/05/2023   PHOS 3.8 12/22/2022   PROT 7.9 02/03/2023   ALBUMIN 3.8 02/03/2023   LABGLOB 2.6 02/25/2022   AGRATIO 1.6 02/25/2022   BILITOT 0.5 02/03/2023   ALKPHOS 129 (H) 02/03/2023   AST 15 02/03/2023   ALT 15 02/03/2023   ANIONGAP 9 02/05/2023    GFR: Estimated Creatinine Clearance: 71.2 mL/min (A) (by C-G formula based on SCr of 0.42 mg/dL (L)).  Recent Results (from the past 240 hour(s))  C Difficile Quick Screen w PCR reflex     Status: None   Collection Time: 02/03/23  9:09 AM   Specimen: STOOL  Result Value Ref Range Status   C Diff antigen NEGATIVE NEGATIVE Final   C Diff toxin NEGATIVE NEGATIVE Final   C  Diff interpretation No C. difficile detected.  Final    Comment: Performed at [facility], [address] [place], [place] [address]  Stool culture     Status: None (Preliminary result)   Collection Time: 02/03/23  9:09 AM   Specimen: STOOL  Result Value Ref Range Status   Salmonella/Shigella Screen  PENDING  Incomplete   Campylobacter Culture PENDING  Incomplete   E coli, Shiga toxin Assay Negative Negative Final    Comment: (NOTE) Performed At: [facility] [address] [place], [place] [id number] Jolene Schimke MD XB:[id number]       Radiology Studies: No results found.    LOS: 2 days    Jacquelin Hawking, MD [facility] 02/05/2023, 12:20 PM   If 7PM-7AM, please contact night-coverage [email or web address]

## 2023-02-05 NOTE — Progress Notes (Signed)
Gastroenterology Inpatient Follow-up Note   PATIENT IDENTIFICATION  Sarah Charles is a 62 y.o. female Hospital Day: 3  SUBJECTIVE  The patient's chart has been reviewed. The patient's labs have been reviewed.  The patient's stool culture is still pending.  The patient's GI pathogen panel is still pending.  Continued improvement in her leukocytosis.  Slight anemia noted on her blood counts with fluid administration. Today the patient is feeling well. She did have a few episodes of low-volume diarrhea and hematochezia being noted. Overall she feels 65% better than coming into the hospital. She is eager to eat and wondering if she will be able to go home soon. She is having some abdominal cramping at times but not severe abdominal pain.   OBJECTIVE  Scheduled Inpatient Medications:   buPROPion  150 mg Oral BID   dicyclomine  10 mg Oral Daily   influenza vac split trivalent PF  0.5 mL Intramuscular Tomorrow-1000   insulin aspart  0-15 Units Subcutaneous TID WC   insulin aspart  0-5 Units Subcutaneous QHS   insulin glargine-yfgn  25 Units Subcutaneous Daily   lisinopril  40 mg Oral Daily   loratadine  10 mg Oral Daily   metoprolol succinate  25 mg Oral Daily   metroNIDAZOLE  500 mg Oral Q12H   [START ON 02/06/2023] pantoprazole  40 mg Oral Daily   rosuvastatin  40 mg Oral QPM   Continuous Inpatient Infusions:   sodium chloride 75 mL/hr at 02/04/23 1334   cefTRIAXone (ROCEPHIN)  IV Stopped (02/05/23 1105)   PRN Inpatient Medications: albuterol, HYDROmorphone (DILAUDID) injection, hyoscyamine, ondansetron **OR** ondansetron (ZOFRAN) IV, oxyCODONE, traZODone   Physical Examination  Temp:  [97.6 F (36.4 C)-97.8 F (36.6 C)] 97.6 F (36.4 C) (09/14 1106) Pulse Rate:  [64-72] 66 (09/14 1130) Resp:  [16-18] 16 (09/14 0606) BP: (149-181)/(63-84) 149/72 (09/14 1130) SpO2:  [95 %-100 %] 98 % (09/14 1106) Temp (24hrs), Avg:97.7 F (36.5 C), Min:97.6 F (36.4 C), Max:97.8 F (36.6  C)    GEN: NAD, appears stated age, doesn't appear chronically ill PSYCH: Cooperative, without pressured speech EYE: Conjunctivae pink, sclerae anicteric ENT: MMM CV: Nontachycardic RESP: No audible wheezing GI: NABS, soft, protuberant abdomen, mild TTP in periumbilical region, no rebound or guarding MSK/EXT: Trace bilateral pedal edema SKIN: No jaundice NEURO:  Alert & Oriented x 3, no focal deficits   Review of Data   Laboratory Studies   Recent Labs  Lab 02/04/23 0446 02/04/23 1648 02/05/23 0519  NA 137  --  138  K 2.7*   < > 3.7  CL 105  --  107  CO2 23  --  22  BUN 13  --  6*  CREATININE <0.30*  --  0.42*  GLUCOSE 103*  --  105*  CALCIUM 8.5*  --  8.3*  MG 1.6*  --   --    < > = values in this interval not displayed.   Recent Labs  Lab 02/03/23 0406  AST 15  ALT 15  ALKPHOS 129*    Recent Labs  Lab 02/03/23 0406 02/04/23 0446 02/05/23 0519  WBC 13.0* 12.4* 9.8  HGB 15.6* 12.2 11.2*  HCT 46.8* 37.0 34.9*  PLT 259 242 205   Recent Labs  Lab 02/05/23 0751  INR 1.1    Imaging Studies  No new imaging studies to review  GI Procedures and Studies  No new studies to review   ASSESSMENT  Ms. Dragovich is a 62 y.o. female with  a pmh significant for CAD, diabetes, hypertension, hyperlipidemia, colon polyps.  Patient admitted with abdominal pain and diarrhea and rectal bleeding with imaging findings concerning for pancolitis.  The patient is hemodynamically stable.  Clinically, I continue to see her having made progress since her initial admission.  I still believe this to be an infectious colitis based on how she has responded.  I expect that she will still have episodes of low-volume diarrhea and rectal bleeding that can occur for up to a week or so as she is still trying to heal from this pancolitis.  Is not clear to me that she needs a colonoscopy as an inpatient as of yet.  Lets see what her stool studies end up showing in regards to her GI pathogen panel  but she certainly has made tremendous improvement since coming into the hospital.  Hopefully she can be transition tomorrow to oral antibiotics to complete a 10-day course.  Pending patient is still doing well I would recommend a colonoscopy in 6 to 8 weeks to get her up-to-date from a colon cancer screening standpoint, colon polyps standpoint, and evaluation of her GI tract post presumed infectious colitis.  If she does not improve, then we will need to consider inpatient colonoscopy   PLAN/RECOMMENDATIONS  Follow-up stool culture and GI pathogen panel Continue antibiotics and consider transition from IV to oral to complete a 10-day course for presumed infectious colitis - Will allow medicine to consider timing of this either on Saturday afternoon versus Sunday morning Currently not planning inpatient colonoscopy Trend CBC Oral iron to be initiated 1 week after discharge Advance diet to heart healthy today Levsin 0.125 3 times daily as needed abdominal cramping/spasms ordered   Please page/call with questions or concerns.   Corliss Parish, MD [facility] Office # [phone]    LOS: 2 days  Lemar Lofty  02/05/2023, 12:53 PM

## 2023-02-05 NOTE — Plan of Care (Signed)
  Problem: Education: Goal: Knowledge of General Education information will improve Description: Including pain rating scale, medication(s)/side effects and non-pharmacologic comfort measures Outcome: Progressing   Problem: Coping: Goal: Level of anxiety will decrease Outcome: Progressing   

## 2023-02-06 DIAGNOSIS — A09 Infectious gastroenteritis and colitis, unspecified: Secondary | ICD-10-CM | POA: Diagnosis not present

## 2023-02-06 DIAGNOSIS — K529 Noninfective gastroenteritis and colitis, unspecified: Secondary | ICD-10-CM | POA: Diagnosis not present

## 2023-02-06 DIAGNOSIS — R933 Abnormal findings on diagnostic imaging of other parts of digestive tract: Secondary | ICD-10-CM

## 2023-02-06 DIAGNOSIS — K5100 Ulcerative (chronic) pancolitis without complications: Secondary | ICD-10-CM | POA: Diagnosis not present

## 2023-02-06 DIAGNOSIS — K625 Hemorrhage of anus and rectum: Secondary | ICD-10-CM | POA: Diagnosis not present

## 2023-02-06 LAB — CBC
HCT: 34.1 % — ABNORMAL LOW (ref 36.0–46.0)
Hemoglobin: 11.2 g/dL — ABNORMAL LOW (ref 12.0–15.0)
MCH: 29.8 pg (ref 26.0–34.0)
MCHC: 32.8 g/dL (ref 30.0–36.0)
MCV: 90.7 fL (ref 80.0–100.0)
Platelets: 197 10*3/uL (ref 150–400)
RBC: 3.76 MIL/uL — ABNORMAL LOW (ref 3.87–5.11)
RDW: 13.3 % (ref 11.5–15.5)
WBC: 7.7 10*3/uL (ref 4.0–10.5)
nRBC: 0 % (ref 0.0–0.2)

## 2023-02-06 LAB — GLUCOSE, CAPILLARY
Glucose-Capillary: 91 mg/dL (ref 70–99)
Glucose-Capillary: 176 mg/dL — ABNORMAL HIGH (ref 70–99)

## 2023-02-06 MED ORDER — ASPIRIN EC 81 MG PO TBEC: 81.0000 mg | DELAYED_RELEASE_TABLET | Freq: Every day | ORAL | Status: DC

## 2023-02-06 MED ORDER — PNEUMOCOCCAL 20-VAL CONJ VACC 0.5 ML IM SUSY
0.5000 mL | PREFILLED_SYRINGE | INTRAMUSCULAR | Status: AC
  Administered 2023-02-06: 0.5 mL via INTRAMUSCULAR
  Filled 2023-02-06: qty 0.5

## 2023-02-06 MED ORDER — METRONIDAZOLE 500 MG PO TABS: 500.0000 mg | ORAL_TABLET | Freq: Two times a day (BID) | ORAL | 0 refills | Status: AC

## 2023-02-06 MED ORDER — CIPROFLOXACIN HCL 500 MG PO TABS: 500.0000 mg | ORAL_TABLET | Freq: Two times a day (BID) | ORAL | 0 refills | Status: AC

## 2023-02-06 NOTE — Progress Notes (Addendum)
Gastroenterology Inpatient Follow-up Note   PATIENT IDENTIFICATION  Sarah Charles is a 62 y.o. female Hospital Day: 4  SUBJECTIVE  The patient's chart has been reviewed. The patient's labs have been reviewed.   The patient is feeling better and wondering if she will be able to go home. She still has some abdominal discomfort and mild rectal bleeding but was thankful that her blood counts did not worsen. She denies any fevers or chills.   OBJECTIVE  Scheduled Inpatient Medications:   buPROPion  150 mg Oral BID   dicyclomine  10 mg Oral Daily   influenza vac split trivalent PF  0.5 mL Intramuscular Tomorrow-1000   insulin aspart  0-15 Units Subcutaneous TID WC   insulin aspart  0-5 Units Subcutaneous QHS   insulin glargine-yfgn  25 Units Subcutaneous Daily   lisinopril  40 mg Oral Daily   loratadine  10 mg Oral Daily   metoprolol succinate  25 mg Oral Daily   metroNIDAZOLE  500 mg Oral Q12H   pantoprazole  40 mg Oral Daily   rosuvastatin  40 mg Oral QPM   Continuous Inpatient Infusions:   sodium chloride Stopped (02/06/23 1031)   cefTRIAXone (ROCEPHIN)  IV 2 g (02/06/23 0835)   PRN Inpatient Medications: albuterol, HYDROmorphone (DILAUDID) injection, hyoscyamine, ondansetron **OR** ondansetron (ZOFRAN) IV, oxyCODONE, traZODone   Physical Examination  Temp:  [98.3 F (36.8 C)-98.4 F (36.9 C)] 98.3 F (36.8 C) (09/15 0524) Pulse Rate:  [64-87] 64 (09/15 0524) Resp:  [17] 17 (09/15 0524) BP: (109-166)/(58-77) 156/74 (09/15 0524) SpO2:  [93 %-99 %] 93 % (09/15 0524) Temp (24hrs), Avg:98.4 F (36.9 C), Min:98.3 F (36.8 C), Max:98.4 F (36.9 C)    GEN: NAD, appears stated age, doesn't appear chronically ill PSYCH: Cooperative, without pressured speech EYE: Conjunctivae pink, sclerae anicteric ENT: MMM CV: Nontachycardic RESP: No audible wheezing GI: NABS, soft, protuberant abdomen, no rebound or guarding MSK/EXT: Trace bilateral pedal edema SKIN: No  jaundice NEURO:  Alert & Oriented x 3, no focal deficits   Review of Data   Laboratory Studies   Recent Labs  Lab 02/04/23 0446 02/04/23 1648 02/05/23 0519  NA 137  --  138  K 2.7*   < > 3.7  CL 105  --  107  CO2 23  --  22  BUN 13  --  6*  CREATININE <0.30*  --  0.42*  GLUCOSE 103*  --  105*  CALCIUM 8.5*  --  8.3*  MG 1.6*  --   --    < > = values in this interval not displayed.   Recent Labs  Lab 02/03/23 0406  AST 15  ALT 15  ALKPHOS 129*    Recent Labs  Lab 02/04/23 0446 02/05/23 0519 02/06/23 0438  WBC 12.4* 9.8 7.7  HGB 12.2 11.2* 11.2*  HCT 37.0 34.9* 34.1*  PLT 242 205 197   Recent Labs  Lab 02/05/23 0751  INR 1.1    Imaging Studies  No new imaging studies to review  GI Procedures and Studies  No new studies to review   ASSESSMENT  Sarah Charles is a 62 y.o. female with a pmh significant for CAD, diabetes, hypertension, hyperlipidemia, colon polyps.  Patient admitted with abdominal pain and diarrhea and rectal bleeding with imaging findings concerning for pancolitis.  The patient is hemodynamically and clinically stable at this time.  Overall has improved since admission.  GI pathogen panel was negative.  Most likely again based on her clinical  presentation this was an acute infectious colitis.  She will be treated for 10-day course of antibiotics.  She will need an updated colonoscopy in 6 to 8 weeks.  We will work on arranging follow-up in the GI clinic to see how she is doing.  She is aware she may still have some rectal bleeding for a few more days but this cleared up.  She will need some oral iron as an outpatient as well.  From a GI perspective, she does not require an inpatient colonoscopy or further inpatient GI workup at this time.  I think she is potentially ready for discharge pending transition of her antibiotics to oral.  All patient questions were answered to the best of my ability, and the patient agrees to the aforementioned plan of action  with follow-up as indicated.   PLAN/RECOMMENDATIONS  Continue antibiotics to complete a 10-day course for presumed infectious colitis (should all be able to be oral) No indication for inpatient colonoscopy CT abdomen/pelvis in 4 weeks to follow-up pancolitis Clinic follow-up after CT Colonoscopy in 6 to 8 weeks for colon polyp surveillance and follow-up of pancolitis Oral iron once daily to be initiated 1 week after discharge Levsin 0.125 3 times daily as needed abdominal cramping/spasms and she may continue this if she finds it helpful as an outpatient.   Please page/call with questions or concerns.   Corliss Parish, MD [facility] Office # [phone]    LOS: 3 days  Lemar Lofty  02/06/2023, 11:14 AM

## 2023-02-06 NOTE — Discharge Instructions (Signed)
Sarah Charles,  You were in the hospital with nausea/vomiting and diarrhea with associated abdominal pain and found to have inflammation of your intestines. This appears to be related to an infection and has improved. Please continue antibiotics and follow-up with your PCP and the GI doctor.

## 2023-02-06 NOTE — Plan of Care (Signed)
Problem: Education: Goal: Knowledge of General Education information will improve Description Including pain rating scale, medication(s)/side effects and non-pharmacologic comfort measures Outcome: Progressing   Problem: Health Behavior/Discharge Planning: Goal: Ability to manage health-related needs will improve Outcome: Progressing

## 2023-02-06 NOTE — Discharge Summary (Signed)
Physician Discharge Summary   Patient: Sarah Charles MRN: [id number] DOB: 03/23/1961  Admit date:     02/03/2023  Discharge date: 02/06/23  Discharge Physician: Jacquelin Hawking, MD   PCP: [name], MD   Recommendations at discharge:  PCP visit for hospital follow-up [facility] follow-up in 6-8 weeks for colonoscopy  Discharge Diagnoses: Principal Problem:   Colitis with rectal bleeding Active Problems:   Pancolitis (HCC)   Rectal bleeding   Acute diarrhea   Imaging of gastrointestinal tract abnormal   Acute blood loss anemia  Resolved Problems:   * No resolved hospital problems. *  Hospital Course: Sarah Charles is a 62 y.o. female with a history of diabetes mellitus type 2, GERD, hyperlipidemia, hypertension.  Patient presented secondary to abdominal pain and diarrhea and was found to have evidence of pancolitis, presumed infectious. GI consulted. Patient started empirically on Ceftriaxone and Flagyl. C. Difficile negative. GI pathogen panel negative. Symptoms improved with antibiotics. Likely infectious etiology in spite of negative lab testing. Patient discharged on Ciprofloxacin and Flagyl to complete a 10 day total course of antibiotics.  Assessment and Plan:  Pancolitis Diarrhea Noted on CT imaging. Concern for possible infectious etiology. Patient started empirically on Ceftriaxone and Flagyl for treatment. GI consulted. C. Difficile testing negative. GI pathogen panel ordered and negative. Still with high suspicion for infectious etiology, especially as symptoms improved prior to discharge. Patient to continue antibiotics with Ciprofloxacin and Flagyl on discharge to complete a total 10-day course of therapy. Patient to follow-up with GI as an outpatient for colonoscopy in 6-8 weeks. Hold aspirin for two days.   Rectal bleeding Likely secondary to colitis. GI consulted. Hemoglobin stable.   Hypokalemia Potassium down to a low of 2.7. Patient given potassium  supplementation. Resolved.   Diabetes mellitus type 2 Uncontrolled with hyperglycemia. Hemoglobin A1C of 10.7%. Resume home regimen.   Primary hypertension Continue metoprolol and lisinopril.   Depression Continue home regimen.   Hyperlipidemia Continue Crestor.   Consultants: [facility] Procedures performed: None  Disposition: Home Diet recommendation: Carb modified diet   DISCHARGE MEDICATION: Allergies as of 02/06/2023       Reactions   Cymbalta [duloxetine Hcl] Nausea Only, Other (See Comments)   Nausea, lack of therapeutic effect        Medication List     STOP taking these medications    dicyclomine 10 MG capsule Commonly known as: BENTYL   oxyCODONE-acetaminophen 5-325 MG tablet Commonly known as: PERCOCET/ROXICET   sucralfate 1 g tablet Commonly known as: Carafate       TAKE these medications    Accu-Chek FastClix Lancet Kit USE AS DIRECTED   ascorbic acid 500 MG tablet Commonly known as: VITAMIN C Take 500 mg by mouth daily.   aspirin EC 81 MG tablet Take 1 tablet (81 mg total) by mouth daily. Start taking on: February 08, 2023 What changed: These instructions start on February 08, 2023. If you are unsure what to do until then, ask your doctor or other care provider.   buPROPion 150 MG 12 hr tablet Commonly known as: WELLBUTRIN SR Take 1 tablet (150 mg total) by mouth 2 (two) times daily.   cetirizine 10 MG tablet Commonly known as: ZYRTEC TAKE 1 TABLET BY MOUTH EVERY DAY   ciprofloxacin 500 MG tablet Commonly known as: Cipro Take 1 tablet (500 mg total) by mouth 2 (two) times daily for 6 days.   clotrimazole 1 % cream Commonly known as: LOTRIMIN APPLY TO THE AFFECTED AREA(S)  beneath BOTH breasts TWICE DAILY What changed: See the new instructions.   Comfort EZ Pen Needles 31G X 5 MM Misc Generic drug: Insulin Pen Needle USE AS DIRECTED ONCE DAILY   diclofenac Sodium 1 % Gel Commonly known as: Voltaren Apply 4  g topically 4 (four) times daily.   fenofibrate 145 MG tablet Commonly known as: TRICOR Take 1 tablet (145 mg total) by mouth every evening.   FLUoxetine 20 MG capsule Commonly known as: PROZAC TAKE ONE CAPSULE BY MOUTH EVERY MORNING What changed:  how much to take how to take this when to take this additional instructions   furosemide 20 MG tablet Commonly known as: LASIX Take 1 tablet (20 mg total) by mouth daily.   gabapentin 300 MG capsule Commonly known as: NEURONTIN Take 2 capsules (600 mg total) by mouth 2 (two) times daily.   hydrOXYzine 25 MG tablet Commonly known as: ATARAX Take 25 mg by mouth 3 (three) times daily.   Lantus SoloStar 100 UNIT/ML Solostar Pen Generic drug: insulin glargine Inject 55 Units into the skin at bedtime.   lidocaine 5 % Commonly known as: LIDODERM Place ONE PATCH onto THE SKIN daily. REMOVE & Discard PATCH WITHIN 12 hours OR as directed by MD What changed: See the new instructions.   lisinopril 40 MG tablet Commonly known as: ZESTRIL Take 1 tablet (40 mg total) by mouth daily.   metFORMIN 500 MG tablet Commonly known as: GLUCOPHAGE Take 2 tablets (1,000 mg total) by mouth 2 (two) times daily. TAKE TWO TABLETS BY MOUTH TWICE DAILY WITH A MEAL Strength: 500 mg What changed: additional instructions   methocarbamol 500 MG tablet Commonly known as: ROBAXIN Take 500 mg by mouth every 8 (eight) hours as needed (for back pain).   metoCLOPramide 10 MG tablet Commonly known as: REGLAN TAKE 1 TABLET BY MOUTH EVERY EIGHT HOURS AS NEEDED FOR NAUSEA OR VOMITING. What changed:  how much to take how to take this when to take this reasons to take this additional instructions   metoprolol succinate 25 MG 24 hr tablet Commonly known as: TOPROL-XL Take 1 tablet (25 mg total) by mouth daily. TAKE ONE TABLET BY MOUTH EVERY MORNING WITH OR immediately following A meal Strength: 25 mg What changed: additional instructions   metroNIDAZOLE  500 MG tablet Commonly known as: FLAGYL Take 1 tablet (500 mg total) by mouth 2 (two) times daily for 6 days.   multivitamin tablet Take 1 tablet by mouth daily.   nitroGLYCERIN 0.4 MG SL tablet Commonly known as: NITROSTAT Place 0.4 mg under the tongue every 5 (five) minutes as needed for chest pain.   OneTouch Delica Plus Lancet33G Misc Use to test blood sugar 3 times a day   OneTouch Verio test strip Generic drug: glucose blood Use as instructed   OneTouch Verio w/Device Kit Use to test blood sugar 3 times a day   oxyCODONE 5 MG immediate release tablet Commonly known as: Oxy IR/ROXICODONE Take 5 mg by mouth every 4 (four) hours as needed for moderate pain.   pantoprazole 40 MG tablet Commonly known as: PROTONIX Take 1 tablet (40 mg total) by mouth 2 (two) times daily.   rosuvastatin 40 MG tablet Commonly known as: CRESTOR Take 1 tablet (40 mg total) by mouth every evening.   silver sulfADIAZINE 1 % cream Commonly known as: SILVADENE Apply 1 Application topically daily.   terbinafine 250 MG tablet Commonly known as: LamISIL Please take one a day x 7days, repeat every 4  weeks x 4 months What changed:  how much to take how to take this when to take this additional instructions   traZODone 50 MG tablet Commonly known as: DESYREL TAKE ONE TABLET BY MOUTH EVERYDAY AT BEDTIME What changed:  how much to take how to take this when to take this additional instructions   vitamin B-12 500 MCG tablet Commonly known as: CYANOCOBALAMIN TAKE ONE TABLET BY MOUTH ONCE DAILY        Follow-up Information     [name], MD. Schedule an appointment as soon as possible for a visit in 1 week(s).   Specialty: Family Medicine Why: For hospital follow-up Contact information: [address] [place] [address] [place] [address] [phone]         Mansouraty, Netty Starring., MD. Schedule an appointment as soon as possible for a visit in 6 week(s).   Specialties:  Gastroenterology, Internal Medicine Contact information: [address] [place] [address] [phone]                Discharge Exam: BP (!) 156/74 (BP Location: Right Arm)   Pulse 64   Temp 98.3 F (36.8 C) (Oral)   Resp 17   SpO2 93%   General exam: Appears calm and comfortable Respiratory system: Clear to auscultation. Respiratory effort normal. Cardiovascular system: S1 & S2 heard, RRR. Gastrointestinal system: Abdomen is nondistended, soft and nontender. Normal bowel sounds heard. Central nervous system: Alert and oriented. No focal neurological deficits. Musculoskeletal: No edema. No calf tenderness Psychiatry: Judgement and insight appear normal. Mood & affect appropriate.   Condition at discharge: stable  The results of significant diagnostics from this hospitalization (including imaging, microbiology, ancillary and laboratory) are listed below for reference.   Imaging Studies: CT ABDOMEN PELVIS W CONTRAST  Result Date: 02/03/2023 CLINICAL DATA:  LLQ abdominal pain lower abdominal pain, V/D EXAM: CT ABDOMEN AND PELVIS WITH CONTRAST TECHNIQUE: Multidetector CT imaging of the abdomen and pelvis was performed using the standard protocol following bolus administration of intravenous contrast. RADIATION DOSE REDUCTION: This exam was performed according to the departmental dose-optimization program which includes automated exposure control, adjustment of the mA and/or kV according to patient size and/or use of iterative reconstruction technique. CONTRAST:  OMNIPAQUE IOHEXOL 300 MG/ML  SOLN COMPARISON:  None Available. FINDINGS: Lower chest: No acute abnormality. Hepatobiliary: No focal liver abnormality. Status post cholecystectomy. No biliary dilatation. Pancreas: No focal lesion. Normal pancreatic contour. No surrounding inflammatory changes. No main pancreatic ductal dilatation. Spleen: Normal in size without focal abnormality. Adrenals/Urinary Tract: No adrenal  nodule bilaterally. Bilateral kidneys enhance symmetrically. Subcentimeter hypodensities are too small to characterize-no further follow-up indicated. No hydronephrosis. No hydroureter. The urinary bladder is unremarkable. On delayed imaging, there is no urothelial wall thickening and there are no filling defects in the opacified portions of the bilateral collecting systems or ureters. Stomach/Bowel: Stomach is within normal limits. No evidence of small bowel wall thickening or dilatation. Third portion of the duodenum diverticula. Diffuse colonic collapse with associated bowel wall thickening and mucosal hyperemia. Single sigmoid colonic diverticula. Appendix appears normal. Vascular/Lymphatic: No abdominal aorta or iliac aneurysm. Severe atherosclerotic plaque of the aorta and its branches. No abdominal, pelvic, or inguinal lymphadenopathy. Reproductive: Uterus and bilateral adnexa are unremarkable. Other: No intraperitoneal free fluid. No intraperitoneal free gas. No organized fluid collection. Musculoskeletal: No abdominal wall hernia or abnormality. No suspicious lytic or blastic osseous lesions. No acute displaced fracture. Multilevel degenerative changes of the spine. Multilevel intervertebral disc space  vacuum phenomenon. Endplate sclerosis at the L1-L2 and L5-S1 levels. Old L3 transverse process fractures versus congenital nonunion. IMPRESSION: 1. Pancolitis. 2. Single sigmoid colonic diverticula.  No acute diverticulitis. 3.  Aortic Atherosclerosis (ICD10-I70.0). Electronically Signed   By: Tish Frederickson M.D.   On: 02/03/2023 07:19    Microbiology: Results for orders placed or performed during the hospital encounter of 02/03/23  C Difficile Quick Screen w PCR reflex     Status: None   Collection Time: 02/03/23  9:09 AM   Specimen: STOOL  Result Value Ref Range Status   C Diff antigen NEGATIVE NEGATIVE Final   C Diff toxin NEGATIVE NEGATIVE Final   C Diff interpretation No C. difficile detected.   Final    Comment: Performed at [facility], [address], [place], [place] [address]  Stool culture     Status: None (Preliminary result)   Collection Time: 02/03/23  9:09 AM   Specimen: STOOL  Result Value Ref Range Status   Salmonella/Shigella Screen Final report  Final   Campylobacter Culture PENDING  Incomplete   E coli, Shiga toxin Assay Negative Negative Final    Comment: (NOTE) Performed At: [facility] [address] [place], [place] [id number] Jolene Schimke MD IO:[id number]   STOOL CULTURE REFLEX - RSASHR     Status: None   Collection Time: 02/03/23  9:09 AM  Result Value Ref Range Status   Stool Culture result 1 (RSASHR) Comment  Final    Comment: (NOTE) No Salmonella or Shigella recovered. Performed At: [facility] [address] [place], [place] [id number] Jolene Schimke MD ZD:[id number]   Gastrointestinal Panel by PCR , Stool     Status: None   Collection Time: 02/04/23  4:17 PM   Specimen: Stool  Result Value Ref Range Status   Campylobacter species NOT DETECTED NOT DETECTED Final   Plesimonas shigelloides NOT DETECTED NOT DETECTED Final   Salmonella species NOT DETECTED NOT DETECTED Final   Yersinia enterocolitica NOT DETECTED NOT DETECTED Final   Vibrio species NOT DETECTED NOT DETECTED Final   Vibrio cholerae NOT DETECTED NOT DETECTED Final   Enteroaggregative E coli (EAEC) NOT DETECTED NOT DETECTED Final   Enteropathogenic E coli (EPEC) NOT DETECTED NOT DETECTED Final   Enterotoxigenic E coli (ETEC) NOT DETECTED NOT DETECTED Final   Shiga like toxin producing E coli (STEC) NOT DETECTED NOT DETECTED Final   Shigella/Enteroinvasive E coli (EIEC) NOT DETECTED NOT DETECTED Final   Cryptosporidium NOT DETECTED NOT DETECTED Final   Cyclospora cayetanensis NOT DETECTED NOT DETECTED Final   Entamoeba histolytica NOT DETECTED NOT DETECTED Final   Giardia lamblia NOT DETECTED NOT DETECTED Final   Adenovirus F40/41 NOT  DETECTED NOT DETECTED Final   Astrovirus NOT DETECTED NOT DETECTED Final   Norovirus GI/GII NOT DETECTED NOT DETECTED Final   Rotavirus A NOT DETECTED NOT DETECTED Final   Sapovirus (I, II, IV, and V) NOT DETECTED NOT DETECTED Final    Comment: Performed at [facility], [address] [place], [place] [address]    Labs: CBC: Recent Labs  Lab 02/03/23 0406 02/04/23 0446 02/05/23 0519 02/06/23 0438  WBC 13.0* 12.4* 9.8 7.7  NEUTROABS 11.3*  --   --   --   HGB 15.6* 12.2 11.2* 11.2*  HCT 46.8* 37.0 34.9* 34.1*  MCV 90.2 89.2 91.1 90.7  PLT 259 242 205 197   Basic Metabolic Panel: Recent Labs  Lab 02/03/23 0406 02/04/23 0446 02/04/23 1648 02/05/23 0519  NA  135 137  --  138  K 3.6 2.7* 3.5 3.7  CL 100 105  --  107  CO2 24 23  --  22  GLUCOSE 353* 103*  --  105*  BUN 26* 13  --  6*  CREATININE 0.60 <0.30*  --  0.42*  CALCIUM 9.7 8.5*  --  8.3*  MG  --  1.6*  --   --    Liver Function Tests: Recent Labs  Lab 02/03/23 0406  AST 15  ALT 15  ALKPHOS 129*  BILITOT 0.5  PROT 7.9  ALBUMIN 3.8   CBG: Recent Labs  Lab 02/05/23 0742 02/05/23 1111 02/05/23 1631 02/05/23 2130 02/06/23 0743  GLUCAP 102* 221* 109* 203* 91    Discharge time spent: 35 minutes.  Signed: Jacquelin Hawking, MD [facility] 02/06/2023

## 2023-02-06 NOTE — Progress Notes (Signed)
Reviewed d/c instructions with pt. All questions answered. Pt waiting on daughter to arrive.

## 2023-02-07 ENCOUNTER — Telehealth: Payer: Self-pay

## 2023-02-07 ENCOUNTER — Other Ambulatory Visit: Payer: Self-pay

## 2023-02-07 DIAGNOSIS — K529 Noninfective gastroenteritis and colitis, unspecified: Secondary | ICD-10-CM

## 2023-02-07 DIAGNOSIS — K5100 Ulcerative (chronic) pancolitis without complications: Secondary | ICD-10-CM
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
## 2023-02-07 NOTE — Telephone Encounter (Signed)
Contacted the patient. Explained the plan of care. Advised the patient she will receive a call from Radiology Scheduling for the CT to be done on or after 03/09/23. Scheduled with the patient, a follow up appointment with Dr Tomasa Rand on 03/23/23 at 11:10 am. I will mail her an appointment card.  Patient has had several "no-show" appointments with [facility]. Will plan to schedule the procedure when she shows for her office visit.

## 2023-02-07 NOTE — Telephone Encounter (Signed)
-----   Message from [facility] sent at 02/06/2023 11:15 AM EDT ----- Regarding: Follow-up Beth, This patient came in with what appeared to be an infectious colitis with rectal bleeding and pancolitis on imaging.  She has improved with supportive measures and antibiotics.  She needs a CT scan in 4 weeks.  Ideally a follow-up with one of the APP's after the CT scan.  Colonoscopy in 8 weeks (patient willing to move forward with this so I think she can just be scheduled so it is on the books and confirmed at the time of follow-up (she is overdue for colon polyp surveillance by 2 years and for follow-up of the pancolitis). Thanks. GM

## 2023-02-08 ENCOUNTER — Ambulatory Visit: Payer: Self-pay | Admitting: *Deleted

## 2023-02-08 LAB — STOOL CULTURE: E coli, Shiga toxin Assay: NEGATIVE

## 2023-02-08 LAB — STOOL CULTURE REFLEX - RSASHR

## 2023-02-08 LAB — STOOL CULTURE REFLEX - CMPCXR

## 2023-02-08 NOTE — Telephone Encounter (Signed)
  Chief Complaint: change in pharmacy- does she need to throw away old pharmacy medication?  Disposition: [] ED /[] Urgent Care (no appt availability in office) / [] Appointment(In office/virtual)/ []  [place] [facility]/ [x] Home Care/ [] Refused Recommended Disposition /[] [place] [facility]/ []  Follow-up with PCP Additional Notes: Patient states she has had recent change in mail order pharmacy and wants to know if she should throw out old bottles of medication - she has received all medications from new pharmacy. Patient advised as long as there has been no change in what she has on hand and she will not get confused- she can finish out what she has on hand and then switch over to new Rx from new mail order she has received.  Patient states she is clear on her medication list and will not get confused.

## 2023-02-08 NOTE — Telephone Encounter (Signed)
Summary: Medication questions for nurse   Has medication questions about what she is supposed to be taking currently, she has old medicine and new medicine     Recent change in Pharmacy- [facility]- now she is using [facility] mail order  Reason for Disposition . Caller has medicine question only, adult not sick, AND triager answers question  Answer Assessment - Initial Assessment Questions 1. NAME of MEDICINE: "What medicine(s) are you calling about?"     Patient has had change in pharmacy and wants to know if she should throw out her previous pharmacy medication. (All medication) 2. QUESTION: "What is your question?" (e.g., double dose of medicine, side effect)     Does she need to throw out previous medication filled by old pharmacy vs new 3. PRESCRIBER: "Who prescribed the medicine?" Reason: if prescribed by specialist, call should be referred to that group.     multiple Explained to patient as long as she is clear on what she is taking- and the medication has not expired- she should be able to finish the medication she has on hand from previous pharmacy before starting on new pharmacy refills. Patient states she is clear on her daily medication(she has a lot) and they are not expired. Patient advised if she feels having all those bottles will confuse her- then she can disposed of the previous pharmacy medications- she states she will not get confused- but may not need RF for a while. Patient advised to call pharmacy and cancel automatic RF -until she needs it.- that will help her not stock pile medication.  Protocols used: Medication Question Call-A-AH

## 2023-02-09 NOTE — Consult Note (Signed)
[facility]  [facility] Inpatient Consult   02/09/2023  Renelda Fok [birth year] [id number]  [facility] [[facility]]  Accountable Care Organization [ACO] Patient: [facility] Medicare  Referral: collaboration follow up  Call received from UR team regarding patient's status with [facility] [employer], patient's chart was accessed for information regarding status checked.  We have reviewed your referral request and have checked rosters and patient is noted as active with [facility] and actively followed by [facility] Coordinator and team on behalf of [facility].  Returned call and left voicemail message for a return call request if more information is needed.  For questions, please contact:  Charlesetta Shanks, RN, BSN, CCM [place]  [facility], [facility] Liaison Direct Dial: [phone] or secure chat Website: [email or web address]

## 2023-02-10 ENCOUNTER — Ambulatory Visit: Payer: Self-pay

## 2023-02-10 NOTE — Patient Outreach (Signed)
Care Coordination   02/10/2023 Name: Sarah Charles MRN: [id number] DOB: [birth year]   Care Coordination Outreach Attempts:  An unsuccessful telephone outreach was attempted for a scheduled appointment today.  Follow Up Plan:  Additional outreach attempts will be made to offer the patient care coordination information and services.   Encounter Outcome:  No Answer   Care Coordination Interventions:  No, not indicated    Delsa Sale RN BSN CCM [place]  [facility], [facility] Nurse Care Coordinator  Direct Dial: [phone] Website: [email or web address]

## 2023-02-11 ENCOUNTER — Ambulatory Visit: Payer: Self-pay

## 2023-02-11 NOTE — Telephone Encounter (Signed)
  Chief Complaint: 8/10 muscle pain in both legs Symptoms: Pain Frequency: woke up 2 days ago Pertinent Negatives: Patient denies dark coloured urine Disposition: [] ED /[] Urgent Care (no appt availability in office) / [] Appointment(In office/virtual)/ []  [place] [facility]/ [] Home Care/ [x] Refused Recommended Disposition /[] [place] [facility]/ []  Follow-up with PCP Additional Notes: Pt states that she woke up 2 days ago with with 8/10 muscle pain in both legs. No recent activity. Pt is on a statin. Pt has no transportation, and is unable to use her smart phone well enough for a VV.  Pt would like pain medication called into [facility] on [address]. Please advise.  Pt would like a call back.    Reason for Disposition  [1] SEVERE pain AND [2] taking a statin medicine (a lipid or cholesterol lowering drug)  Answer Assessment - Initial Assessment Questions 1. ONSET: "When did the pain start?"      2 days ago 2. LOCATION: "Where is the pain located?"      In muscles - both legs upper 3. PAIN: "How bad is the pain?"    (Scale 1-10; or mild, moderate, severe)   -  MILD (1-3): doesn't interfere with normal activities    -  MODERATE (4-7): interferes with normal activities (e.g., work or school) or awakens from sleep, limping    -  SEVERE (8-10): excruciating pain, unable to do any normal activities, unable to walk     8/10 4. WORK OR EXERCISE: "Has there been any recent work or exercise that involved this part of the body?"      No - no woke like this 5. CAUSE: "What do you think is causing the leg pain?"     no 6. OTHER SYMPTOMS: "Do you have any other symptoms?" (e.g., chest pain, back pain, breathing difficulty, swelling, rash, fever, numbness, weakness)     Back pain ongoing  Protocols used: Leg Pain-A-AH, Muscle Aches and Body Pain-A-AH

## 2023-02-11 NOTE — Telephone Encounter (Signed)
Spoke with patient . Patient advised that she should go to UC to be seen for 8/10 pain in her legs. Patient voiced that she would.

## 2023-02-14 NOTE — Telephone Encounter (Signed)
Agree with nurse's disposition

## 2023-02-18 ENCOUNTER — Telehealth: Payer: Self-pay | Admitting: *Deleted

## 2023-02-18 NOTE — Progress Notes (Signed)
  Care Coordination Note  02/18/2023 Name: Sarah Charles MRN: [id number] DOB: 1960-10-09  [name] is a 61 y.o. year old female who is a primary care patient of [name], MD and is actively engaged with the care management team. I reached out to [name] by phone today to assist with re-scheduling a follow up visit with the RN Case Manager  Follow up plan: Telephone appointment with care management team member scheduled for:10/9  [facility] Coordination Care Guide  Direct Dial: [phone]

## 2023-02-22 ENCOUNTER — Ambulatory Visit: Payer: Medicare HMO | Attending: Family Medicine | Admitting: Family Medicine

## 2023-02-22 ENCOUNTER — Encounter: Payer: Self-pay | Admitting: Family Medicine

## 2023-02-22 VITALS — BP 153/79 | HR 64 | Ht 63.0 in | Wt 167.2 lb

## 2023-02-22 DIAGNOSIS — Z7984 Long term (current) use of oral hypoglycemic drugs: Secondary | ICD-10-CM

## 2023-02-22 DIAGNOSIS — E1169 Type 2 diabetes mellitus with other specified complication: Secondary | ICD-10-CM | POA: Diagnosis not present

## 2023-02-22 DIAGNOSIS — E785 Hyperlipidemia, unspecified: Secondary | ICD-10-CM

## 2023-02-22 DIAGNOSIS — Z794 Long term (current) use of insulin: Secondary | ICD-10-CM

## 2023-02-22 DIAGNOSIS — I10 Essential (primary) hypertension: Secondary | ICD-10-CM

## 2023-02-22 DIAGNOSIS — I152 Hypertension secondary to endocrine disorders: Secondary | ICD-10-CM

## 2023-02-22 DIAGNOSIS — E1159 Type 2 diabetes mellitus with other circulatory complications: Secondary | ICD-10-CM

## 2023-02-22 DIAGNOSIS — R110 Nausea: Secondary | ICD-10-CM

## 2023-02-22 DIAGNOSIS — R21 Rash and other nonspecific skin eruption: Secondary | ICD-10-CM

## 2023-02-22 MED ORDER — LANTUS SOLOSTAR 100 UNIT/ML ~~LOC~~ SOPN
60.0000 [IU] | PEN_INJECTOR | Freq: Every day | SUBCUTANEOUS | 6 refills | Status: DC
Start: 2023-02-22 — End: 2023-05-16

## 2023-02-22 MED ORDER — PERMETHRIN 5 % EX CREA: 1.0000 | TOPICAL_CREAM | Freq: Once | CUTANEOUS | 0 refills | Status: DC

## 2023-02-22 MED ORDER — METOCLOPRAMIDE HCL 10 MG PO TABS: ORAL_TABLET | ORAL | 0 refills | Status: DC

## 2023-02-22 MED ORDER — TRIAMCINOLONE ACETONIDE 0.1 % EX CREA: 1.0000 | TOPICAL_CREAM | Freq: Two times a day (BID) | CUTANEOUS | 1 refills | Status: DC

## 2023-02-22 MED ORDER — CARVEDILOL 3.125 MG PO TABS: 3.1250 mg | ORAL_TABLET | Freq: Two times a day (BID) | ORAL | 3 refills | Status: DC

## 2023-02-22 MED ORDER — METHOCARBAMOL 750 MG PO TABS: 750.0000 mg | ORAL_TABLET | Freq: Three times a day (TID) | ORAL | 1 refills | Status: DC | PRN

## 2023-02-22 NOTE — Patient Instructions (Addendum)
Please discontinue metoprolol and start carvedilol 3.25 mg twice daily for your blood pressure.
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
## 2023-02-28 ENCOUNTER — Other Ambulatory Visit: Payer: Self-pay | Admitting: Family Medicine

## 2023-03-02 ENCOUNTER — Ambulatory Visit: Payer: Self-pay
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
## 2023-03-08 ENCOUNTER — Encounter (HOSPITAL_COMMUNITY): Payer: Self-pay

## 2023-03-08 ENCOUNTER — Ambulatory Visit (HOSPITAL_COMMUNITY)
Admission: RE | Admit: 2023-03-08 | Discharge: 2023-03-08 | Disposition: A | Discharge status: Home / Self Care | Payer: Medicare HMO | Source: Ambulatory Visit | Attending: Gastroenterology | Admitting: Gastroenterology

## 2023-03-08 DIAGNOSIS — K5100 Ulcerative (chronic) pancolitis without complications: Secondary | ICD-10-CM | POA: Diagnosis present

## 2023-03-08 DIAGNOSIS — K625 Hemorrhage of anus and rectum: Secondary | ICD-10-CM | POA: Diagnosis present

## 2023-03-08 DIAGNOSIS — K529 Noninfective gastroenteritis and colitis, unspecified: Secondary | ICD-10-CM | POA: Insufficient documentation

## 2023-03-08 MED ORDER — SODIUM CHLORIDE (PF) 0.9 % IJ SOLN
INTRAMUSCULAR | Status: AC
  Filled 2023-03-08: qty 50

## 2023-03-08 MED ORDER — IOHEXOL 9 MG/ML PO SOLN
ORAL | Status: AC
  Filled 2023-03-08: qty 1000

## 2023-03-08 MED ORDER — IOHEXOL 300 MG/ML  SOLN
100.0000 mL | Freq: Once | INTRAMUSCULAR | Status: AC | PRN
  Administered 2023-03-08: 100 mL via INTRAVENOUS

## 2023-03-08 MED ORDER — IOHEXOL 9 MG/ML PO SOLN
1000.0000 mL | ORAL | Status: AC
  Administered 2023-03-08: 1000 mL via ORAL

## 2023-03-09 ENCOUNTER — Other Ambulatory Visit: Payer: Self-pay | Admitting: Family Medicine

## 2023-03-09 DIAGNOSIS — I10 Essential (primary) hypertension: Secondary | ICD-10-CM

## 2023-03-09 DIAGNOSIS — R110 Nausea: Secondary | ICD-10-CM

## 2023-03-15 ENCOUNTER — Ambulatory Visit: Payer: 59 | Admitting: Family Medicine

## 2023-03-23 ENCOUNTER — Encounter: Payer: Self-pay | Admitting: Gastroenterology

## 2023-03-23 ENCOUNTER — Ambulatory Visit (INDEPENDENT_AMBULATORY_CARE_PROVIDER_SITE_OTHER): Payer: Medicare HMO | Admitting: Gastroenterology

## 2023-03-23 VITALS — BP 150/82 | HR 57 | Ht 63.0 in | Wt 170.5 lb

## 2023-03-23 DIAGNOSIS — F17200 Nicotine dependence, unspecified, uncomplicated: Secondary | ICD-10-CM

## 2023-03-23 DIAGNOSIS — I2510 Atherosclerotic heart disease of native coronary artery without angina pectoris: Secondary | ICD-10-CM | POA: Diagnosis not present

## 2023-03-23 DIAGNOSIS — K2100 Gastro-esophageal reflux disease with esophagitis, without bleeding: Secondary | ICD-10-CM | POA: Diagnosis not present

## 2023-03-23 DIAGNOSIS — Z1211 Encounter for screening for malignant neoplasm of colon: Secondary | ICD-10-CM

## 2023-03-23 DIAGNOSIS — A09 Infectious gastroenteritis and colitis, unspecified: Secondary | ICD-10-CM

## 2023-03-23 MED ORDER — NA SULFATE-K SULFATE-MG SULF 17.5-3.13-1.6 GM/177ML PO SOLN: 1.0000 | Freq: Once | ORAL | 0 refills | Status: AC

## 2023-03-23 MED ORDER — NA SULFATE-K SULFATE-MG SULF 17.5-3.13-1.6 GM/177ML PO SOLN: 1.0000 | Freq: Once | ORAL | 0 refills | Status: DC
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
## 2023-03-23 NOTE — Patient Instructions (Signed)
You have been scheduled for an endoscopy and colonoscopy. Please follow the written instructions given to you at your visit today.  Please pick up your prep supplies at the pharmacy within the next 1-3 days.  If you use inhalers (even only as needed), please bring them with you on the day of your procedure.  DO NOT TAKE 7 DAYS PRIOR TO TEST- Trulicity (dulaglutide) Ozempic, Wegovy (semaglutide) Mounjaro (tirzepatide) Bydureon Bcise (exanatide extended release)  DO NOT TAKE 1 DAY PRIOR TO YOUR TEST Rybelsus (semaglutide) Adlyxin (lixisenatide) Victoza (liraglutide) Byetta (exanatide)  _______________________________________________________  If your blood pressure at your visit was 140/90 or greater, please contact your primary care physician to follow up on this.  _______________________________________________________  If you are age 24 or older, your body mass index should be between 23-30. Your Body mass index is 30.2 kg/m. If this is out of the aforementioned range listed, please consider follow up with your Primary Care Provider.  If you are age 73 or younger, your body mass index should be between 19-25. Your Body mass index is 30.2 kg/m. If this is out of the aformentioned range listed, please consider follow up with your Primary Care Provider.   ________________________________________________________  The  GI providers would like to encourage you to use [facility] to communicate with providers for non-urgent requests or questions.  Due to long hold times on the telephone, sending your provider a message by [facility] may be a faster and more efficient way to get a response.  Please allow 48 business hours for a response.  Please remember that this is for non-urgent requests.   It was a pleasure to see you today!  Thank you for trusting me with your gastrointestinal care!    Scott E.Tomasa Rand, MD

## 2023-03-25 ENCOUNTER — Ambulatory Visit: Payer: Self-pay

## 2023-03-25 NOTE — Patient Outreach (Signed)
  Care Coordination   03/25/2023 Name: Sarah Charles MRN: [id number] DOB: 1961-02-07   Care Coordination Outreach Attempts:  An unsuccessful telephone outreach was attempted for a scheduled appointment today.  Follow Up Plan:  Additional outreach attempts will be made to offer the patient care coordination information and services.   Encounter Outcome:  Patient Request to Call Back   Care Coordination Interventions:  No, not indicated    Delsa Sale RN BSN CCM [place]  [facility], [facility] Nurse Care Coordinator  Direct Dial: [phone] Website: [email or web address]

## 2023-03-31 ENCOUNTER — Ambulatory Visit: Payer: Self-pay

## 2023-03-31 NOTE — Patient Outreach (Addendum)
  Care Coordination   03/31/2023 Name: Sarah Charles MRN: [id number] DOB: 04-08-1961   Care Coordination Outreach Attempts:  An unsuccessful telephone outreach was attempted for a scheduled appointment today.  Follow Up Plan:  Additional outreach attempts will be made to offer the patient care coordination information and services.   Encounter Outcome:  No Answer   Care Coordination Interventions:  No, not indicated    Delsa Sale RN BSN CCM [place]  [facility], [facility] Nurse Care Coordinator  Direct Dial: [phone] Website: [email or web address]

## 2023-04-13 ENCOUNTER — Other Ambulatory Visit: Payer: Self-pay | Admitting: Family Medicine

## 2023-04-13 DIAGNOSIS — F32A Depression, unspecified: Secondary | ICD-10-CM

## 2023-04-28 ENCOUNTER — Other Ambulatory Visit: Payer: Self-pay | Admitting: Family Medicine

## 2023-04-28 DIAGNOSIS — R600 Localized edema: Secondary | ICD-10-CM

## 2023-04-28 NOTE — Telephone Encounter (Signed)
Medication Refill -  Most Recent Primary Care Visit:  Provider: [name]  Department: [facility]  Visit Type: HOSPITAL FU  Date: 02/22/2023  Medication:  fluid pills, don't know the name & can't get the bottle   *Patient is completely out  Has the patient contacted their pharmacy? Yes, advised to contact PCP  Is this the correct pharmacy for this prescription?  Yes, the one listed below.  This is the patient's preferred pharmacy:  [id number] - [place], [place] - [address] AT [facility] [address] Phone: [phone] Fax: [phone]   Has the prescription been filled recently?  No.  Is the patient out of the medication?  Yes, patient is completely out.  Has the patient been seen for an appointment in the last year OR does the patient have an upcoming appointment? Patient has an appointment with [facility] scheduled for 12.23.24

## 2023-04-29 ENCOUNTER — Ambulatory Visit: Payer: Self-pay

## 2023-04-29 MED ORDER — FUROSEMIDE 20 MG PO TABS
20.0000 mg | ORAL_TABLET | Freq: Every day | ORAL | 0 refills | Status: DC
Start: 2023-04-29 — End: 2023-05-16

## 2023-04-29 NOTE — Telephone Encounter (Signed)
Requested medications are due for refill today.  yes  Requested medications are on the active medications list.  yes  Last refill. 11/12/2021 #90 1 rf  Future visit scheduled.   yes  Notes to clinic.  Abnormal labs. Please review for refill.    Requested Prescriptions  Pending Prescriptions Disp Refills   furosemide (LASIX) 20 MG tablet 90 tablet 1    Sig: Take 1 tablet (20 mg total) by mouth daily.     Cardiovascular:  Diuretics - Loop Failed - 04/28/2023 11:32 AM      Failed - Ca in normal range and within 180 days    Calcium  Date Value Ref Range Status  02/05/2023 8.3 (L) 8.9 - 10.3 mg/dL Final   Calcium, Ion  Date Value Ref Range Status  12/17/2022 1.24 1.15 - 1.40 mmol/L Final         Failed - Cr in normal range and within 180 days    Creat  Date Value Ref Range Status  07/23/2016 0.50 0.50 - 1.05 mg/dL Final    Comment:      For patients > or = 62 years of age: The upper reference limit for Creatinine is approximately 13% higher for people identified as African-American.      Creatinine, Ser  Date Value Ref Range Status  02/05/2023 0.42 (L) 0.44 - 1.00 mg/dL Final   Creatinine, POC  Date Value Ref Range Status  09/16/2016 100 mg/dL Final   Creatinine, Urine  Date Value Ref Range Status  07/23/2016 47 20 - 320 mg/dL Final         Failed - Mg Level in normal range and within 180 days    Magnesium  Date Value Ref Range Status  02/04/2023 1.6 (L) 1.7 - 2.4 mg/dL Final    Comment:    Performed at [facility], [address], [place], [place] [address]         Failed - Last BP in normal range    BP Readings from Last 1 Encounters:  03/23/23 (!) 150/82         Passed - K in normal range and within 180 days    Potassium  Date Value Ref Range Status  02/05/2023 3.7 3.5 - 5.1 mmol/L Final    Comment:    HEMOLYSIS AT THIS LEVEL MAY AFFECT RESULT         Passed - Na in normal range and within 180 days    Sodium  Date Value Ref  Range Status  02/05/2023 138 135 - 145 mmol/L Final  02/25/2022 140 134 - 144 mmol/L Final         Passed - Cl in normal range and within 180 days    Chloride  Date Value Ref Range Status  02/05/2023 107 98 - 111 mmol/L Final         Passed - Valid encounter within last 6 months    Recent Outpatient Visits           2 months ago Hypertension associated with diabetes (HCC)   [facility] [place]. [facility] [name], MD   5 months ago Type 2 diabetes mellitus with other specified complication, with long-term current use of insulin (HCC)   [facility] [place]. [facility] [name], MD   10 months ago Acute cough   [facility]  [facility] [place]. [facility] [name], MD   10 months ago Vaginal candidiasis   [facility] [place]. [facility] [name], MD   1 year ago Type 2 diabetes mellitus with other specified complication, with long-term current use of insulin (HCC)   [facility] Of [place]. [facility] [name], MD       Future Appointments             In 2 weeks Sharon Seller, [place] [facility] Of [place]. [facility]   In 2 months Patwardhan, Anabel Bene, MD [facility]

## 2023-04-29 NOTE — Telephone Encounter (Addendum)
Patient advised  UC. Agreeable to NT disposition

## 2023-04-29 NOTE — Telephone Encounter (Signed)
Summary: leg swelling   Pt stated she is experiencing swelling in both of her legs; they are very tight. Pt requesting her Rx for fluid tablets be filled. Rx refill request is pending.  Seeking clinical advice.     Called pt - left message on machine to return call.

## 2023-04-29 NOTE — Telephone Encounter (Signed)
  Chief Complaint: Swollen feet and ankles - out of lasix Symptoms: swollen feet and ankles Frequency: 2 days Pertinent Negatives: Patient denies any other s/s Disposition: [] ED /[] Urgent Care (no appt availability in office) / [] Appointment(In office/virtual)/ []  [facility]/ [] Home Care/ [] Refused Recommended Disposition /[] [facility]/ [x]  Follow-up with PCP Additional Notes: Returned pt's call. She states that other than her leg swelling, she feels fine. Pt is out of her lasix medication, refill is pending.  Triage is unable to refill medication d/t abnormal labs. Refill request sent to provider for review. Protocol advises that pt should be seen within 3 days. No office appts available. Pt unable to have a VV. Please advise if pt needs to be seen in office, sent to UC or simply have her lasix refilled.    Summary: leg swelling   Pt stated she is experiencing swelling in both of her legs; they are very tight. Pt requesting her Rx for fluid tablets be filled. Rx refill request is pending.  Seeking clinical advice.       Reason for Disposition  [1] MILD swelling of both ankles (i.e., pedal edema) AND [2] new-onset or worsening  Answer Assessment - Initial Assessment Questions 1. ONSET: "When did the swelling start?" (e.g., minutes, hours, days)     2 days 2. LOCATION: "What part of the leg is swollen?"  "Are both legs swollen or just one leg?"     Both legs below the knee 3. SEVERITY: "How bad is the swelling?" (e.g., localized; mild, moderate, severe)   - Localized: Small area of swelling localized to one leg.   - MILD pedal edema: Swelling limited to foot and ankle, pitting edema < 1/4 inch (6 mm) deep, rest and elevation eliminate most or all swelling.   - MODERATE edema: Swelling of lower leg to knee, pitting edema > 1/4 inch (6 mm) deep, rest and elevation only partially reduce swelling.   - SEVERE edema: Swelling extends above knee, facial or hand  swelling present.      mild 4. REDNESS: "Does the swelling look red or infected?"     Yes - unsure if infected 5. PAIN: "Is the swelling painful to touch?" If Yes, ask: "How painful is it?"   (Scale 1-10; mild, moderate or severe)      A little 6. FEVER: "Do you have a fever?" If Yes, ask: "What is it, how was it measured, and when did it start?"      no 7. CAUSE: "What do you think is causing the leg swelling?"     No water pills 8. MEDICAL HISTORY: "Do you have a history of blood clots (e.g., DVT), cancer, heart failure, kidney disease, or liver failure?"     Stent in heart 9. RECURRENT SYMPTOM: "Have you had leg swelling before?" If Yes, ask: "When was the last time?" "What happened that time?"     Yes ongoing 10. OTHER SYMPTOMS: "Do you have any other symptoms?" (e.g., chest pain, difficulty breathing)       no  Protocols used: Leg Swelling and Edema-A-AH

## 2023-05-01 ENCOUNTER — Encounter (HOSPITAL_COMMUNITY): Payer: Self-pay | Admitting: Emergency Medicine

## 2023-05-01 ENCOUNTER — Ambulatory Visit (HOSPITAL_COMMUNITY)
Admission: EM | Admit: 2023-05-01 | Discharge: 2023-05-01 | Disposition: A | Discharge status: Home / Self Care | Payer: Medicare HMO | Attending: Emergency Medicine | Admitting: Emergency Medicine

## 2023-05-01 DIAGNOSIS — R21 Rash and other nonspecific skin eruption: Secondary | ICD-10-CM

## 2023-05-01 MED ORDER — MUPIROCIN 2 % EX OINT: 1.0000 | TOPICAL_OINTMENT | Freq: Two times a day (BID) | CUTANEOUS | 0 refills | Status: DC

## 2023-05-01 MED ORDER — CEPHALEXIN 500 MG PO CAPS: 500.0000 mg | ORAL_CAPSULE | Freq: Four times a day (QID) | ORAL | 0 refills | Status: AC

## 2023-05-01 MED ORDER — DIPHENHYDRAMINE HCL 25 MG PO TABS: 25.0000 mg | ORAL_TABLET | Freq: Four times a day (QID) | ORAL | 0 refills | Status: DC | PRN

## 2023-05-01 NOTE — ED Triage Notes (Signed)
Pt c/o bites/sores on both legs and both arms for 2 days. States they are very itchy

## 2023-05-01 NOTE — Discharge Instructions (Addendum)
Take the antibiotics as prescribed and until finished, you can take them with food to prevent gastrointestinal upset.  For any breakthrough itching you can take the Benadryl, this may cause drowsiness.  Use the topical antibacterial ointment twice daily.  It is unclear what is caused these outbreaks, please wash all clothing in hot water and a hypoallergenic detergent.  Your blood pressure was high in clinic today.  Your legs have also been swollen.  Please follow-up with your primary care provider for management of your chronic healthcare conditions.  Please pick up your Lasix from the pharmacy.

## 2023-05-01 NOTE — ED Provider Notes (Signed)
[facility]    CSN: [id number] Arrival date & time: 05/01/23  1327      History   Chief Complaint Chief Complaint  Patient presents with   Insect Bite    HPI Sarah Charles is a 62 y.o. female.   Patient presents to clinic for complaints of sores that itch to her arms and legs.  Reports that started 2 days ago.  Reports she does not sleep in the bed, she sleeps in a chair.  She has not seen any bugs biting her, but thinks it may be related to bugs.  The bite areas are very itchy.  She has not tried anything for the sores.  Her lower legs are swelling, reports she is on her way to the pharmacy after she leaves to pick up her Lasix.  Patient said she has a follow-up appointment with her primary care provider on December 23.  Initially she noticed the sores starting on her right thigh, there was a large area that busted.  Since then the areas have spread.  The history is provided by the patient and medical records.    Past Medical History:  Diagnosis Date   Arthritis    Coronary artery calcification seen on CT scan    a. 07/2017 noted on CT abd.   Depression    GERD (gastroesophageal reflux disease)    Hyperlipidemia    Hypertension    Obesity    Sleep apnea    a. Does not use CPAP "cause i dont think I need it anymore".   Tobacco abuse    Type II diabetes mellitus [facility])     Patient Active Problem List   Diagnosis Date Noted   Abnormal findings on diagnostic imaging of digestive system 02/06/2023   Infectious colitis 02/06/2023   Acute blood loss anemia 02/05/2023   Pancolitis (HCC) 02/04/2023   Rectal bleeding 02/04/2023   Acute diarrhea 02/04/2023   Imaging of gastrointestinal tract abnormal 02/04/2023   Colitis with rectal bleeding 02/03/2023   DKA (diabetic ketoacidosis) (HCC) 12/17/2022   Cellulitis 12/17/2022   Nausea and vomiting 12/17/2022   Hypertensive urgency 12/17/2022   Hyperlipidemia LDL goal <70 07/19/2022   Sleep apnea, unspecified  11/26/2020   CAD (coronary artery disease) 11/22/2017   Chest pain, precordial 11/04/2017   Acute chest pain    Type 2 diabetes mellitus with hyperlipidemia (HCC) 10/29/2017   Diabetic polyneuropathy associated with type 2 diabetes mellitus (HCC) 05/30/2017   Mixed dyslipidemia 05/30/2017   Depression 05/31/2016   Uncontrolled type 2 diabetes mellitus with complication 04/08/2016   Uncontrolled type 2 diabetes mellitus with complication, with long-term current use of insulin 04/20/2015   Cellulitis of breast 12/29/2013   Diabetes mellitus (HCC) 12/29/2013   Cellulitis of female breast 12/29/2013   Symptomatic menopausal or female climacteric states 04/08/2008   OTHER SPECIFIED DISEASE OF NAIL 04/08/2008   URINARY URGENCY 04/08/2008   COUGH 04/02/2008   FATIGUE 04/12/2007   Hypertension 04/12/2007   OBESITY, NOS 07/21/2006   Major depressive disorder, recurrent episode (HCC) 07/21/2006   TOBACCO DEPENDENCE 07/21/2006   GASTROESOPHAGEAL REFLUX, NO ESOPHAGITIS 07/21/2006   INCONTINENCE, STRESS, FEMALE 07/21/2006    Past Surgical History:  Procedure Laterality Date   BIOPSY  12/21/2022   Procedure: BIOPSY;  Surgeon: Kerin Salen, MD;  Location: [facility];  Service: Gastroenterology;;   CHOLECYSTECTOMY     CORONARY PRESSURE/FFR STUDY N/A 11/04/2017   Procedure: INTRAVASCULAR PRESSURE WIRE/FFR STUDY;  Surgeon: Swaziland, Peter M, MD;  Location:  [facility];  Service: Cardiovascular;  Laterality: N/A;   CORONARY STENT INTERVENTION N/A 11/04/2017   Procedure: CORONARY STENT INTERVENTION;  Surgeon: Swaziland, Peter M, MD;  Location: [facility] INVASIVE CV LAB;  Service: Cardiovascular;  Laterality: N/A;   ESOPHAGOGASTRODUODENOSCOPY (EGD) WITH PROPOFOL N/A 12/21/2022   Procedure: ESOPHAGOGASTRODUODENOSCOPY (EGD) WITH PROPOFOL;  Surgeon: Kerin Salen, MD;  Location: [facility];  Service: Gastroenterology;  Laterality: N/A;   INCISION AND DRAINAGE ABSCESS Left 12/22/2012   Procedure: INCISION AND  DRAINAGE ABSCESS;  Surgeon: Dalia Heading, MD;  Location: [facility];  Service: General;  Laterality: Left;   LEFT HEART CATH AND CORONARY ANGIOGRAPHY N/A 11/04/2017   Procedure: LEFT HEART CATH AND CORONARY ANGIOGRAPHY;  Surgeon: Swaziland, Peter M, MD;  Location: [facility] INVASIVE CV LAB;  Service: Cardiovascular;  Laterality: N/A;   TONSILLECTOMY     TUBAL LIGATION      OB History   No obstetric history on file.      Home Medications    Prior to Admission medications   Medication Sig Start Date End Date Taking? Authorizing Provider  cephALEXin (KEFLEX) 500 MG capsule Take 1 capsule (500 mg total) by mouth 4 (four) times daily for 7 days. 05/01/23 05/08/23 Yes Rinaldo Ratel, [place], FNP  diphenhydrAMINE (BENADRYL) 25 MG tablet Take 1 tablet (25 mg total) by mouth every 6 (six) hours as needed. 05/01/23  Yes Rinaldo Ratel, [place] N, FNP  mupirocin ointment (BACTROBAN) 2 % Apply 1 Application topically 2 (two) times daily. 05/01/23  Yes Rinaldo Ratel, [place] N, FNP  aspirin EC 81 MG tablet Take 1 tablet (81 mg total) by mouth daily. 02/08/23   Narda Bonds, MD  Blood Glucose Monitoring Suppl (ONETOUCH VERIO) w/Device KIT Use to test blood sugar 3 times a day 02/09/22   [name], MD  buPROPion (WELLBUTRIN SR) 150 MG 12 hr tablet Take 1 tablet (150 mg total) by mouth 2 (two) times daily. 01/28/23   [name], MD  carvedilol (COREG) 3.125 MG tablet Take 1 tablet (3.125 mg total) by mouth 2 (two) times daily with a meal. 02/22/23   [name], MD  cetirizine (ZYRTEC) 10 MG tablet TAKE 1 TABLET BY MOUTH EVERY DAY Patient taking differently: Take 10 mg by mouth daily. 08/02/19   [name], MD  clotrimazole (LOTRIMIN) 1 % cream APPLY TO THE AFFECTED AREA(S) beneath BOTH breasts TWICE DAILY Patient taking differently: Apply 1 Application topically 2 (two) times daily. 03/08/22   [name], MD  diclofenac Sodium (VOLTAREN) 1 % GEL Apply 4 g topically 4 (four) times daily. 05/05/21   [name], MD  fenofibrate (TRICOR) 145 MG tablet Take 1 tablet (145 mg total) by mouth every evening. 01/28/23   [name], MD  FLUoxetine (PROZAC) 20 MG capsule TAKE ONE CAPSULE BY MOUTH EVERY MORNING Patient taking differently: Take 20 mg by mouth daily. 01/28/23   [name], MD  furosemide (LASIX) 20 MG tablet Take 1 tablet (20 mg total) by mouth daily. 04/29/23   [name], MD  gabapentin (NEURONTIN) 300 MG capsule Take 2 capsules (600 mg total) by mouth 2 (two) times daily. 01/28/23   [name], MD  glucose blood (ONETOUCH VERIO) test strip Use as instructed 02/09/22   [name], MD  hydrOXYzine (ATARAX) 25 MG tablet Take 25 mg by mouth 3 (three) times daily. 12/15/22 06/13/23  [provider]  insulin glargine (LANTUS SOLOSTAR) 100 UNIT/ML Solostar Pen Inject 60 Units into the skin at bedtime. 02/22/23   Newlin,  Enobong, MD  Insulin Pen Needle (COMFORT EZ PEN NEEDLES) 31G X 5 MM MISC USE AS DIRECTED ONCE DAILY 09/16/21   [name], MD  Lancets [facility] DELICA PLUS LANCET33G) MISC Use to test blood sugar 3 times a day 02/09/22   [name], MD  Lancets Misc. (ACCU-CHEK FASTCLIX LANCET) KIT USE AS DIRECTED 11/12/21   [name], MD  lidocaine (LIDODERM) 5 % Place ONE PATCH onto THE SKIN daily. REMOVE & Discard PATCH WITHIN 12 hours OR as directed by MD Patient taking differently: Place 1 patch onto the skin every 12 (twelve) hours. 12/30/22   [name], MD  lisinopril (ZESTRIL) 40 MG tablet Take 1 tablet (40 mg total) by mouth daily. 01/28/23   [name], MD  metFORMIN (GLUCOPHAGE) 500 MG tablet Take 2 tablets (1,000 mg total) by mouth 2 (two) times daily. TAKE TWO TABLETS BY MOUTH TWICE DAILY WITH A MEAL Strength: 500 mg Patient taking differently: Take 1,000 mg by mouth 2 (two) times daily. 01/28/23   [name], MD  methocarbamol (ROBAXIN) 750 MG tablet Take 1 tablet (750 mg total) by mouth every 8 (eight) hours as needed (for back pain).  02/22/23   [name], MD  metoCLOPramide (REGLAN) 10 MG tablet TAKE 1 TABLET EVERY 8 HOURS AS NEEDED FOR NAUSEA AND VOMITING 03/09/23   [name], MD  Multiple Vitamin (MULTIVITAMIN) tablet Take 1 tablet by mouth daily.    [provider]  nitroGLYCERIN (NITROSTAT) 0.4 MG SL tablet Place 0.4 mg under the tongue every 5 (five) minutes as needed for chest pain.    [provider]  oxyCODONE (OXY IR/ROXICODONE) 5 MG immediate release tablet Take 5 mg by mouth every 4 (four) hours as needed for moderate pain. 11/29/22   [provider]  pantoprazole (PROTONIX) 40 MG tablet Take 1 tablet (40 mg total) by mouth 2 (two) times daily. 12/23/22   Lewie Chamber, MD  permethrin (ELIMITE) 5 % cream Apply topically. 03/03/23   [provider]  rosuvastatin (CRESTOR) 40 MG tablet Take 1 tablet (40 mg total) by mouth every evening. 01/28/23   [name], MD  silver sulfADIAZINE (SILVADENE) 1 % cream Apply 1 Application topically daily. 11/21/22   Lonell Grandchild, MD  terbinafine (LAMISIL) 250 MG tablet Please take one a day x 7days, repeat every 4 weeks x 4 months Patient taking differently: Take 250 mg by mouth daily. 05/07/21   Lenn Sink, DPM  traZODone (DESYREL) 50 MG tablet TAKE 1 TABLET AT BEDTIME 04/13/23   [name], MD  triamcinolone cream (KENALOG) 0.1 % Apply 1 Application topically 2 (two) times daily. 02/22/23   [name], MD  vitamin B-12 (CYANOCOBALAMIN) 500 MCG tablet TAKE ONE TABLET BY MOUTH ONCE DAILY Patient taking differently: Take 500 mcg by mouth daily. 01/10/23   [name], MD  vitamin C (ASCORBIC ACID) 500 MG tablet Take 500 mg by mouth daily.    [provider]    Family History Family History  Problem Relation Age of Onset   Hypertension Mother    CVA Mother    COPD Mother    Heart disease Mother    Kidney Stones Mother    Heart attack Mother        MI in late 20's   Breast cancer Mother    CAD  Father    Hypercholesterolemia Father    Heart attack Father        Died @ 27 of MI   Diabetes Sister  Cancer Maternal Uncle    Diabetes Maternal Grandmother    Cancer Maternal Grandfather        lung cancer   Colon cancer Neg Hx    Esophageal cancer Neg Hx    Stomach cancer Neg Hx    Pancreatic cancer Neg Hx     Social History Social History   Tobacco Use   Smoking status: Every Day    Current packs/day: 0.00    Average packs/day: 1 pack/day for 41.0 years (41.0 ttl pk-yrs)    Types: Cigarettes    Start date: 10/06/1977    Last attempt to quit: 10/07/2018    Years since quitting: 4.5   Smokeless tobacco: Never  Vaping Use   Vaping status: Never Used  Substance Use Topics   Alcohol use: No   Drug use: No     Allergies   Cymbalta [duloxetine hcl]   Review of Systems Review of Systems  Per HPI   Physical Exam Triage Vital Signs ED Triage Vitals  Encounter Vitals Group     BP 05/01/23 1335 (!) 183/82     Systolic BP Percentile --      Diastolic BP Percentile --      Pulse Rate 05/01/23 1335 84     Resp 05/01/23 1335 18     Temp 05/01/23 1335 98 F (36.7 C)     Temp Source 05/01/23 1335 Oral     SpO2 05/01/23 1335 95 %     Weight --      Height --      Head Circumference --      Peak Flow --      Pain Score 05/01/23 1334 6     Pain Loc --      Pain Education --      Exclude from Growth Chart --    No data found.  Updated Vital Signs BP (!) 183/82 (BP Location: Left Arm)   Pulse 84   Temp 98 F (36.7 C) (Oral)   Resp 18   SpO2 95%   Visual Acuity Right Eye Distance:   Left Eye Distance:   Bilateral Distance:    Right Eye Near:   Left Eye Near:    Bilateral Near:     Physical Exam Vitals and nursing note reviewed.  Constitutional:      Appearance: Normal appearance.  HENT:     Head: Normocephalic and atraumatic.     Right Ear: External ear normal.     Left Ear: External ear normal.     Nose: Nose normal.     Mouth/Throat:      Mouth: Mucous membranes are moist.  Eyes:     Conjunctiva/sclera: Conjunctivae normal.  Cardiovascular:     Rate and Rhythm: Normal rate.  Pulmonary:     Effort: Pulmonary effort is normal. No respiratory distress.  Musculoskeletal:        General: Normal range of motion.     Right lower leg: 2+ Pitting Edema present.     Left lower leg: 2+ Pitting Edema present.  Skin:    General: Skin is warm and dry.     Findings: Wound present.          Comments: Multiple scattered sores and lesions across patient's arm and legs.  The largest is to her left lateral thigh.  Area appears to be bullous in nature.  Another bolus structure to her left lower anterior calf.  Other sores are flat and erythematous.  Neurological:  General: No focal deficit present.     Mental Status: She is alert and oriented to person, place, and time.  Psychiatric:        Mood and Affect: Mood normal.        Behavior: Behavior normal. Behavior is cooperative.      UC Treatments / Results  Labs (all labs ordered are listed, but only abnormal results are displayed) Labs Reviewed - No data to display  EKG   Radiology No results found.  Procedures Procedures (including critical care time)  Medications Ordered in UC Medications - No data to display  Initial Impression / Assessment and Plan / UC Course  I have reviewed the triage vital signs and the nursing notes.  Pertinent labs & imaging results that were available during my care of the patient were reviewed by me and considered in my medical decision making (see chart for details).  Vitals and triage reviewed, patient is hemodynamically stable.  Lower extremity edema consistent with not taking her Lasix.  Bullous source across her arms and legs, will cover with Keflex for impetigo.  No streaking.  No fevers.  No signs of systemic infection.  Encouraged strict primary care follow-up.  Plan of care, follow-up care return precautions given, no questions at  this time.     Final Clinical Impressions(s) / UC Diagnoses   Final diagnoses:  Rash and nonspecific skin eruption     Discharge Instructions      Take the antibiotics as prescribed and until finished, you can take them with food to prevent gastrointestinal upset.  For any breakthrough itching you can take the Benadryl, this may cause drowsiness.  Use the topical antibacterial ointment twice daily.  It is unclear what is caused these outbreaks, please wash all clothing in hot water and a hypoallergenic detergent.  Your blood pressure was high in clinic today.  Your legs have also been swollen.  Please follow-up with your primary care provider for management of your chronic healthcare conditions.  Please pick up your Lasix from the pharmacy.     ED Prescriptions     Medication Sig Dispense Auth. Provider   cephALEXin (KEFLEX) 500 MG capsule Take 1 capsule (500 mg total) by mouth 4 (four) times daily for 7 days. 28 capsule Rinaldo Ratel, [place] N, [place]   mupirocin ointment (BACTROBAN) 2 % Apply 1 Application topically 2 (two) times daily. 22 g Tawna Alwin, [place] N, [place]   diphenhydrAMINE (BENADRYL) 25 MG tablet Take 1 tablet (25 mg total) by mouth every 6 (six) hours as needed. 30 tablet Skyllar Notarianni, [place] N, [place]      PDMP not reviewed this encounter.   Jaiven Graveline, [place] N, [place] 05/01/23 1409

## 2023-05-05 ENCOUNTER — Encounter: Payer: Medicare HMO | Admitting: Gastroenterology

## 2023-05-09 ENCOUNTER — Other Ambulatory Visit: Payer: Self-pay | Admitting: Family Medicine

## 2023-05-10 ENCOUNTER — Other Ambulatory Visit: Payer: Self-pay | Admitting: Family Medicine

## 2023-05-10 DIAGNOSIS — E1159 Type 2 diabetes mellitus with other circulatory complications: Secondary | ICD-10-CM

## 2023-05-13 ENCOUNTER — Encounter (HOSPITAL_COMMUNITY): Payer: Self-pay

## 2023-05-13 ENCOUNTER — Ambulatory Visit (HOSPITAL_COMMUNITY)
Admission: EM | Admit: 2023-05-13 | Discharge: 2023-05-13 | Disposition: A | Discharge status: Home / Self Care | Payer: Medicare HMO | Attending: Internal Medicine | Admitting: Internal Medicine

## 2023-05-13 DIAGNOSIS — J0100 Acute maxillary sinusitis, unspecified: Secondary | ICD-10-CM | POA: Diagnosis not present

## 2023-05-13 MED ORDER — KETOROLAC TROMETHAMINE 30 MG/ML IJ SOLN
INTRAMUSCULAR | Status: AC
Start: 2023-05-13 — End: ?
  Filled 2023-05-13: qty 1

## 2023-05-13 MED ORDER — AMOXICILLIN-POT CLAVULANATE 875-125 MG PO TABS: 1.0000 | ORAL_TABLET | Freq: Two times a day (BID) | ORAL | 0 refills | Status: DC

## 2023-05-13 MED ORDER — BENZONATATE 100 MG PO CAPS: 100.0000 mg | ORAL_CAPSULE | Freq: Three times a day (TID) | ORAL | 0 refills | Status: DC | PRN

## 2023-05-13 MED ORDER — KETOROLAC TROMETHAMINE 30 MG/ML IJ SOLN
30.0000 mg | Freq: Once | INTRAMUSCULAR | Status: AC
  Administered 2023-05-13: 30 mg via INTRAMUSCULAR

## 2023-05-13 NOTE — ED Provider Notes (Signed)
[facility]    CSN: [id number] Arrival date & time: 05/13/23  1542      History   Chief Complaint Chief Complaint  Patient presents with   Cough   Nasal Congestion    HPI Sarah Charles is a 62 y.o. female.   62 y.o. female who presents to urgent care with complaints of cough, congestion, headache and sore throat that has been on-going for about 1.5 weeks. She reports the last few days she has had trouble sleeping due to the symptoms especially the cough and nasal congestion. She has tried Robitussin for her cough without relief. She has an headache due to sinus congestion and pressure. Her pain is 6/10 overall. She denies fevers.  She hasn't had much of an appetite but is drinking well. Denies urinary symptoms, nausea, vomiting, abdominal pain, diarrhea, fevers, chills.    Cough Associated symptoms: headaches and sore throat   Associated symptoms: no chest pain, no chills, no ear pain, no fever, no rash and no shortness of breath     Past Medical History:  Diagnosis Date   Arthritis    Coronary artery calcification seen on CT scan    a. 07/2017 noted on CT abd.   Depression    GERD (gastroesophageal reflux disease)    Hyperlipidemia    Hypertension    Obesity    Sleep apnea    a. Does not use CPAP "cause i dont think I need it anymore".   Tobacco abuse    Type II diabetes mellitus [facility])     Patient Active Problem List   Diagnosis Date Noted   Abnormal findings on diagnostic imaging of digestive system 02/06/2023   Infectious colitis 02/06/2023   Acute blood loss anemia 02/05/2023   Pancolitis (HCC) 02/04/2023   Rectal bleeding 02/04/2023   Acute diarrhea 02/04/2023   Imaging of gastrointestinal tract abnormal 02/04/2023   Colitis with rectal bleeding 02/03/2023   DKA (diabetic ketoacidosis) (HCC) 12/17/2022   Cellulitis 12/17/2022   Nausea and vomiting 12/17/2022   Hypertensive urgency 12/17/2022   Hyperlipidemia LDL goal <70 07/19/2022   Sleep  apnea, unspecified 11/26/2020   CAD (coronary artery disease) 11/22/2017   Chest pain, precordial 11/04/2017   Acute chest pain    Type 2 diabetes mellitus with hyperlipidemia (HCC) 10/29/2017   Diabetic polyneuropathy associated with type 2 diabetes mellitus (HCC) 05/30/2017   Mixed dyslipidemia 05/30/2017   Depression 05/31/2016   Uncontrolled type 2 diabetes mellitus with complication 04/08/2016   Uncontrolled type 2 diabetes mellitus with complication, with long-term current use of insulin 04/20/2015   Cellulitis of breast 12/29/2013   Diabetes mellitus (HCC) 12/29/2013   Cellulitis of female breast 12/29/2013   Symptomatic menopausal or female climacteric states 04/08/2008   OTHER SPECIFIED DISEASE OF NAIL 04/08/2008   URINARY URGENCY 04/08/2008   COUGH 04/02/2008   FATIGUE 04/12/2007   Hypertension 04/12/2007   OBESITY, NOS 07/21/2006   Major depressive disorder, recurrent episode (HCC) 07/21/2006   TOBACCO DEPENDENCE 07/21/2006   GASTROESOPHAGEAL REFLUX, NO ESOPHAGITIS 07/21/2006   INCONTINENCE, STRESS, FEMALE 07/21/2006    Past Surgical History:  Procedure Laterality Date   BIOPSY  12/21/2022   Procedure: BIOPSY;  Surgeon: Kerin Salen, MD;  Location: [facility];  Service: Gastroenterology;;   CHOLECYSTECTOMY     CORONARY PRESSURE/FFR STUDY N/A 11/04/2017   Procedure: INTRAVASCULAR PRESSURE WIRE/FFR STUDY;  Surgeon: Swaziland, Peter M, MD;  Location: [facility] INVASIVE CV LAB;  Service: Cardiovascular;  Laterality: N/A;   CORONARY STENT  INTERVENTION N/A 11/04/2017   Procedure: CORONARY STENT INTERVENTION;  Surgeon: Swaziland, Peter M, MD;  Location: [facility] INVASIVE CV LAB;  Service: Cardiovascular;  Laterality: N/A;   ESOPHAGOGASTRODUODENOSCOPY (EGD) WITH PROPOFOL N/A 12/21/2022   Procedure: ESOPHAGOGASTRODUODENOSCOPY (EGD) WITH PROPOFOL;  Surgeon: Kerin Salen, MD;  Location: [facility];  Service: Gastroenterology;  Laterality: N/A;   INCISION AND DRAINAGE ABSCESS Left 12/22/2012    Procedure: INCISION AND DRAINAGE ABSCESS;  Surgeon: Dalia Heading, MD;  Location: [facility];  Service: General;  Laterality: Left;   LEFT HEART CATH AND CORONARY ANGIOGRAPHY N/A 11/04/2017   Procedure: LEFT HEART CATH AND CORONARY ANGIOGRAPHY;  Surgeon: Swaziland, Peter M, MD;  Location[facility] CV LAB;  Service: Cardiovascular;  Laterality: N/A;   TONSILLECTOMY     TUBAL LIGATION      OB History   No obstetric history on file.      Home Medications    Prior to Admission medications   Medication Sig Start Date End Date Taking? Authorizing Provider  aspirin EC 81 MG tablet Take 1 tablet (81 mg total) by mouth daily. 02/08/23   Narda Bonds, MD  Blood Glucose Monitoring Suppl (ONETOUCH VERIO) w/Device KIT Use to test blood sugar 3 times a day 02/09/22   [name], MD  buPROPion (WELLBUTRIN SR) 150 MG 12 hr tablet Take 1 tablet (150 mg total) by mouth 2 (two) times daily. 01/28/23   [name], MD  carvedilol (COREG) 3.125 MG tablet TAKE 1 TABLET TWICE DAILY WITH MEALS (STOP TOPROL XL) 05/10/23   [name], MD  cetirizine (ZYRTEC) 10 MG tablet TAKE 1 TABLET BY MOUTH EVERY DAY Patient taking differently: Take 10 mg by mouth daily. 08/02/19   [name], MD  clotrimazole (LOTRIMIN) 1 % cream APPLY TO THE AFFECTED AREA(S) beneath BOTH breasts TWICE DAILY Patient taking differently: Apply 1 Application topically 2 (two) times daily. 03/08/22   [name], MD  diclofenac Sodium (VOLTAREN) 1 % GEL Apply 4 g topically 4 (four) times daily. 05/05/21   [name], MD  diphenhydrAMINE (BENADRYL) 25 MG tablet Take 1 tablet (25 mg total) by mouth every 6 (six) hours as needed. 05/01/23   Garrison, [place] N, FNP  fenofibrate (TRICOR) 145 MG tablet Take 1 tablet (145 mg total) by mouth every evening. 01/28/23   [name], MD  FLUoxetine (PROZAC) 20 MG capsule TAKE ONE CAPSULE BY MOUTH EVERY MORNING Patient taking differently: Take 20 mg by mouth daily. 01/28/23   [name], MD  furosemide (LASIX) 20 MG tablet Take 1 tablet (20 mg total) by mouth daily. 04/29/23   [name], MD  gabapentin (NEURONTIN) 300 MG capsule Take 2 capsules (600 mg total) by mouth 2 (two) times daily. 01/28/23   [name], MD  glucose blood (ONETOUCH VERIO) test strip Use as instructed 02/09/22   [name], MD  hydrOXYzine (ATARAX) 25 MG tablet Take 25 mg by mouth 3 (three) times daily. 12/15/22 06/13/23  [provider]  insulin glargine (LANTUS SOLOSTAR) 100 UNIT/ML Solostar Pen Inject 60 Units into the skin at bedtime. 02/22/23   [name], MD  Insulin Pen Needle (COMFORT EZ PEN NEEDLES) 31G X 5 MM MISC USE AS DIRECTED ONCE DAILY 09/16/21   [name], MD  Lancets [facility] DELICA PLUS LANCET33G) MISC Use to test blood sugar 3 times a day 02/09/22   [name], MD  Lancets Misc. (ACCU-CHEK FASTCLIX LANCET) KIT USE AS DIRECTED 11/12/21   [name], MD  lidocaine (LIDODERM) 5 %  Place ONE PATCH onto THE SKIN daily. REMOVE & Discard PATCH WITHIN 12 hours OR as directed by MD Patient taking differently: Place 1 patch onto the skin every 12 (twelve) hours. 12/30/22   [name], MD  lisinopril (ZESTRIL) 40 MG tablet TAKE 1 TABLET EVERY DAY 05/10/23   [name], MD  metFORMIN (GLUCOPHAGE) 500 MG tablet Take 2 tablets (1,000 mg total) by mouth 2 (two) times daily. TAKE TWO TABLETS BY MOUTH TWICE DAILY WITH A MEAL Strength: 500 mg Patient taking differently: Take 1,000 mg by mouth 2 (two) times daily. 01/28/23   [name], MD  methocarbamol (ROBAXIN) 750 MG tablet Take 1 tablet (750 mg total) by mouth every 8 (eight) hours as needed (for back pain). 02/22/23   [name], MD  metoCLOPramide (REGLAN) 10 MG tablet TAKE 1 TABLET EVERY 8 HOURS AS NEEDED FOR NAUSEA AND VOMITING 03/09/23   [name], MD  Multiple Vitamin (MULTIVITAMIN) tablet Take 1 tablet by mouth daily.    [provider]  mupirocin ointment (BACTROBAN) 2 %  Apply 1 Application topically 2 (two) times daily. 05/01/23   Garrison, [place] N, FNP  nitroGLYCERIN (NITROSTAT) 0.4 MG SL tablet Place 0.4 mg under the tongue every 5 (five) minutes as needed for chest pain.    [provider]  oxyCODONE (OXY IR/ROXICODONE) 5 MG immediate release tablet Take 5 mg by mouth every 4 (four) hours as needed for moderate pain. 11/29/22   [provider]  pantoprazole (PROTONIX) 40 MG tablet Take 1 tablet (40 mg total) by mouth 2 (two) times daily. 12/23/22   Lewie Chamber, MD  permethrin (ELIMITE) 5 % cream Apply topically. 03/03/23   [provider]  rosuvastatin (CRESTOR) 40 MG tablet Take 1 tablet (40 mg total) by mouth every evening. 01/28/23   [name], MD  silver sulfADIAZINE (SILVADENE) 1 % cream Apply 1 Application topically daily. 11/21/22   Lonell Grandchild, MD  terbinafine (LAMISIL) 250 MG tablet Please take one a day x 7days, repeat every 4 weeks x 4 months Patient taking differently: Take 250 mg by mouth daily. 05/07/21   Lenn Sink, DPM  traZODone (DESYREL) 50 MG tablet TAKE 1 TABLET AT BEDTIME 04/13/23   [name], MD  triamcinolone cream (KENALOG) 0.1 % Apply 1 Application topically 2 (two) times daily. 02/22/23   [name], MD  vitamin B-12 (CYANOCOBALAMIN) 500 MCG tablet TAKE ONE TABLET BY MOUTH ONCE DAILY Patient taking differently: Take 500 mcg by mouth daily. 01/10/23   [name], MD  vitamin C (ASCORBIC ACID) 500 MG tablet Take 500 mg by mouth daily.    [provider]    Family History Family History  Problem Relation Age of Onset   Hypertension Mother    CVA Mother    COPD Mother    Heart disease Mother    Kidney Stones Mother    Heart attack Mother        MI in late 36's   Breast cancer Mother    CAD Father    Hypercholesterolemia Father    Heart attack Father        Died @ 49 of MI   Diabetes Sister    Cancer Maternal Uncle    Diabetes Maternal Grandmother    Cancer  Maternal Grandfather        lung cancer   Colon cancer Neg Hx    Esophageal cancer Neg Hx    Stomach cancer Neg Hx    Pancreatic cancer  Neg Hx     Social History Social History   Tobacco Use   Smoking status: Every Day    Current packs/day: 0.00    Average packs/day: 1 pack/day for 41.0 years (41.0 ttl pk-yrs)    Types: Cigarettes    Start date: 10/06/1977    Last attempt to quit: 10/07/2018    Years since quitting: 4.6   Smokeless tobacco: Never  Vaping Use   Vaping status: Never Used  Substance Use Topics   Alcohol use: No   Drug use: No     Allergies   Cymbalta [duloxetine hcl]   Review of Systems Review of Systems  Constitutional:  Negative for chills and fever.  HENT:  Positive for congestion and sore throat. Negative for ear pain.   Eyes:  Negative for pain and visual disturbance.  Respiratory:  Positive for cough. Negative for shortness of breath.   Cardiovascular:  Negative for chest pain and palpitations.  Gastrointestinal:  Negative for abdominal pain and vomiting.  Genitourinary:  Negative for dysuria and hematuria.  Musculoskeletal:  Negative for arthralgias and back pain.  Skin:  Negative for color change and rash.  Neurological:  Positive for headaches. Negative for seizures and syncope.  All other systems reviewed and are negative.    Physical Exam Triage Vital Signs ED Triage Vitals  Encounter Vitals Group     BP      Systolic BP Percentile      Diastolic BP Percentile      Pulse      Resp      Temp      Temp src      SpO2      Weight      Height      Head Circumference      Peak Flow      Pain Score      Pain Loc      Pain Education      Exclude from Growth Chart    No data found.  Updated Vital Signs There were no vitals taken for this visit.  Visual Acuity Right Eye Distance:   Left Eye Distance:   Bilateral Distance:    Right Eye Near:   Left Eye Near:    Bilateral Near:     Physical Exam Vitals and nursing note  reviewed.  Constitutional:      General: She is not in acute distress.    Appearance: She is well-developed.  HENT:     Head: Normocephalic and atraumatic.     Right Ear: Hearing normal. There is impacted cerumen.     Left Ear: Hearing normal. There is impacted cerumen.     Ears:     Comments: TM's appear clear after irrigation.     Mouth/Throat:     Mouth: Mucous membranes are moist.     Pharynx: Posterior oropharyngeal erythema (very mild) present.  Eyes:     Conjunctiva/sclera: Conjunctivae normal.  Cardiovascular:     Rate and Rhythm: Normal rate and regular rhythm.     Heart sounds: No murmur heard. Pulmonary:     Effort: Pulmonary effort is normal. No respiratory distress.     Breath sounds: Normal breath sounds. No wheezing or rhonchi.  Abdominal:     Palpations: Abdomen is soft.     Tenderness: There is no abdominal tenderness.  Musculoskeletal:        General: No swelling.     Cervical back: Neck supple.  Skin:    General:  Skin is warm and dry.     Capillary Refill: Capillary refill takes less than 2 seconds.  Neurological:     Mental Status: She is alert.  Psychiatric:        Mood and Affect: Mood normal.      UC Treatments / Results  Labs (all labs ordered are listed, but only abnormal results are displayed) Labs Reviewed - No data to display  EKG   Radiology No results found.  Procedures Procedures (including critical care time)  Medications Ordered in UC Medications - No data to display  Initial Impression / Assessment and Plan / UC Course  I have reviewed the triage vital signs and the nursing notes.  Pertinent labs & imaging results that were available during my care of the patient were reviewed by me and considered in my medical decision making (see chart for details).     Acute non-recurrent maxillary sinusitis  Symptoms most consistent with a sinus infection given physical exam findings and subjective symptoms. We did irrigate the  cerumen bilateral as we were unable to see the TMs but these appear clear after cleaning out. We will treat with the following:  Toradol injection given for pain. This is not a narcotic and will not make you sleepy.  Augmentin 875mg  twice daily for 7 days. Take with food.  Benzonatate 100mg  every 8 hours as needed for cough.  Rest and stay hydrated. If you are able to take Tylenol or ibuprofen, then use this for pain or fevers.  Return to urgent care or PCP if symptoms worsen or fail to resolve.     Final Clinical Impressions(s) / UC Diagnoses   Final diagnoses:  None   Discharge Instructions   None    ED Prescriptions   None    PDMP not reviewed this encounter.   Landis Martins, [place] 05/13/23 1813

## 2023-05-13 NOTE — ED Triage Notes (Signed)
Pt presents with cough, nasal congestion, sore throat, and headaches for approximately 1.5 weeks. Pt denies fevers. Pt states she has not been able to sleep good these past few nights. Robitussin cough syrup taken with no improvement. Pt currently rates her overall pain a 6/10.

## 2023-05-13 NOTE — Discharge Instructions (Addendum)
Symptoms most consistent with a sinus infection given physical exam findings and subjective symptoms. We did irrigate the earwax as we were unable to see the eardrums but these appear clear after cleaning out. We will treat with the following:  Toradol injection given for pain. This is not a narcotic and will not make you sleepy.  Augmentin 875mg  twice daily for 7 days. Take with food.  Benzonatate 100mg  every 8 hours as needed for cough.  Rest and stay hydrated. If you are able to take Tylenol or ibuprofen, then use this for pain or fevers.  Return to urgent care or PCP if symptoms worsen or fail to resolve.

## 2023-05-16 ENCOUNTER — Ambulatory Visit: Payer: Medicare HMO | Attending: Physician Assistant | Admitting: Physician Assistant

## 2023-05-16 ENCOUNTER — Other Ambulatory Visit: Payer: Self-pay

## 2023-05-16 ENCOUNTER — Encounter: Payer: Self-pay | Admitting: Physician Assistant

## 2023-05-16 VITALS — BP 154/82 | HR 63 | Wt 181.6 lb

## 2023-05-16 DIAGNOSIS — F17210 Nicotine dependence, cigarettes, uncomplicated: Secondary | ICD-10-CM

## 2023-05-16 DIAGNOSIS — F32A Depression, unspecified: Secondary | ICD-10-CM | POA: Diagnosis not present

## 2023-05-16 DIAGNOSIS — E1169 Type 2 diabetes mellitus with other specified complication: Secondary | ICD-10-CM

## 2023-05-16 DIAGNOSIS — R600 Localized edema: Secondary | ICD-10-CM

## 2023-05-16 DIAGNOSIS — E785 Hyperlipidemia, unspecified: Secondary | ICD-10-CM

## 2023-05-16 DIAGNOSIS — Z794 Long term (current) use of insulin: Secondary | ICD-10-CM | POA: Diagnosis not present

## 2023-05-16 DIAGNOSIS — I2510 Atherosclerotic heart disease of native coronary artery without angina pectoris: Secondary | ICD-10-CM

## 2023-05-16 LAB — POCT GLYCOSYLATED HEMOGLOBIN (HGB A1C): HbA1c, POC (controlled diabetic range): 8.2 % — AB (ref 0.0–7.0)

## 2023-05-16 LAB — GLUCOSE, POCT (MANUAL RESULT ENTRY): POC Glucose: 271 mg/dL — AB (ref 70–99)

## 2023-05-16 MED ORDER — METFORMIN HCL 500 MG PO TABS: 1000.0000 mg | ORAL_TABLET | Freq: Two times a day (BID) | ORAL | 1 refills | Status: DC

## 2023-05-16 MED ORDER — LANTUS SOLOSTAR 100 UNIT/ML ~~LOC~~ SOPN: 33.0000 [IU] | PEN_INJECTOR | Freq: Two times a day (BID) | SUBCUTANEOUS | 6 refills | Status: DC

## 2023-05-16 MED ORDER — BUPROPION HCL ER (SR) 150 MG PO TB12: 150.0000 mg | ORAL_TABLET | Freq: Two times a day (BID) | ORAL | 0 refills | Status: DC

## 2023-05-16 MED ORDER — CARVEDILOL 3.125 MG PO TABS
3.1250 mg | ORAL_TABLET | Freq: Two times a day (BID) | ORAL | 1 refills | Status: DC
  Filled 2023-07-15: qty 180, 90d supply, fill #0

## 2023-05-16 MED ORDER — FLUCONAZOLE 150 MG PO TABS
150.0000 mg | ORAL_TABLET | Freq: Once | ORAL | 0 refills | Status: AC
  Filled 2023-05-16: qty 1, 1d supply, fill #0

## 2023-05-16 MED ORDER — FUROSEMIDE 20 MG PO TABS: 20.0000 mg | ORAL_TABLET | Freq: Every day | ORAL | 2 refills | Status: DC

## 2023-05-16 MED ORDER — FENOFIBRATE 145 MG PO TABS: 145.0000 mg | ORAL_TABLET | Freq: Every evening | ORAL | 0 refills | Status: DC
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
## 2023-05-19 ENCOUNTER — Telehealth: Payer: Self-pay

## 2023-05-19 NOTE — Telephone Encounter (Signed)
Copied from CRM [id number]. Topic: Appointment Scheduling - Scheduling Inquiry for Clinic >> May 19, 2023 12:05 PM Santiya F wrote: Reason for CRM: Pt is calling in because they would like to set up a colonoscopy. Please follow up with pt

## 2023-05-20 NOTE — Telephone Encounter (Signed)
Pt was called and a Vm was left informing patient that she did a cologuard sample in December and it was negative. If she is having any new symptoms to call office and we will place referral.

## 2023-05-30 ENCOUNTER — Ambulatory Visit: Payer: Medicare HMO | Admitting: Physician Assistant

## 2023-06-07 ENCOUNTER — Other Ambulatory Visit: Payer: Medicare HMO

## 2023-06-07 ENCOUNTER — Other Ambulatory Visit: Payer: Self-pay | Admitting: Family Medicine

## 2023-06-07 MED ORDER — METHOCARBAMOL 750 MG PO TABS: 750.0000 mg | ORAL_TABLET | Freq: Three times a day (TID) | ORAL | 1 refills | Status: DC | PRN

## 2023-06-07 NOTE — Telephone Encounter (Signed)
 Requested medication (s) are due for refill today - yes  Requested medication (s) are on the active medication list -yes  Future visit scheduled -yes  Last refill: 02/22/23 #90  Notes to clinic: non delegated Rx  Requested Prescriptions  Pending Prescriptions Disp Refills   methocarbamol  (ROBAXIN ) 750 MG tablet 90 tablet 1    Sig: Take 1 tablet (750 mg total) by mouth every 8 (eight) hours as needed (for back pain).     Not Delegated - Analgesics:  Muscle Relaxants Failed - 06/07/2023  3:35 PM      Failed - This refill cannot be delegated      Passed - Valid encounter within last 6 months    Recent Outpatient Visits           3 weeks ago Type 2 diabetes mellitus with other specified complication, with long-term current use of insulin  (HCC)   [place] [facility] [place]. [facility] [address], [place], [place]   3 months ago Hypertension associated with diabetes [facility])   [place] [facility] [place]. [facility] Delbert Clam, MD   6 months ago Type 2 diabetes mellitus with other specified complication, with long-term current use of insulin  [facility])   [place] [facility] Of [place]. [facility] Delbert Clam, MD   11 months ago Acute cough   [place] [facility] [place] [facility] [place]. [facility] Delbert Clam, MD   12 months ago Vaginal candidiasis   [place] [facility] [place] [facility] [place]. [facility] Delbert Clam, MD       Future Appointments             In 2 weeks Danton, Jon CHRISTELLA RIGGERS [place] [facility] [place]. [facility]   In 1 month Patwardhan, Newman PARAS, MD [facility]   In 2 months Delbert Clam, MD [facility] [place] - A Dept Of [place]. [facility]               Requested Prescriptions  Pending Prescriptions  Disp Refills   methocarbamol  (ROBAXIN ) 750 MG tablet 90 tablet 1    Sig: Take 1 tablet (750 mg total) by mouth every 8 (eight) hours as needed (for back pain).     Not Delegated - Analgesics:  Muscle Relaxants Failed - 06/07/2023  3:35 PM      Failed - This refill cannot be delegated      Passed - Valid encounter within last 6 months    Recent Outpatient Visits           3 weeks ago Type 2 diabetes mellitus with other specified complication, with long-term current use of insulin  (HCC)   [place] [facility] [place]. [facility] [place], [place], [place]   3 months ago Hypertension associated with diabetes [facility])   [place] [facility] [place]. [facility] Delbert Clam, MD   6 months ago Type 2 diabetes mellitus with other specified complication, with long-term current use of insulin  [facility])   [place] [facility] [place]. [facility] Delbert Clam, MD   11 months ago Acute cough   [place]  [facility] - [facility] Dept Of [place]. [facility] Delbert Clam, MD   12 months ago Vaginal candidiasis   [place] [facility] [place] - [facility] Dept Of [place]. [facility] Delbert Clam, MD       Future Appointments             In 2 weeks Danton, Jon CHRISTELLA RIGGERS [place] [facility] [place]. [facility]   In 1 month Patwardhan, Newman PARAS, MD [facility], [facility]   In 2 months Delbert Clam, MD [facility] [place] - A Dept Of . [facility]

## 2023-06-07 NOTE — Telephone Encounter (Signed)
 Medication Refill -  Most Recent Primary Care Visit:  Provider: DANTON JON HERO  Department[facility]  Visit Type: OFFICE VISIT  Date: 05/16/2023  Medication: methocarbamol  (ROBAXIN ) 750 MG tablet   Has the patient contacted their pharmacy? No   Is this the correct pharmacy for this prescription? Yes If no, delete pharmacy and type the correct one.  This is the patient's preferred pharmacy:   [facility] - [place], [place] - [address] Phone: [phone]  Fax: [phone]      Has the prescription been filled recently? No  Is the patient out of the medication? Yes  Has the patient been seen for an appointment in the last year OR does the patient have an upcoming appointment? Yes  Can we respond through MyChart? No  Please assist patient further

## 2023-06-09 ENCOUNTER — Telehealth: Payer: Self-pay | Admitting: Family Medicine

## 2023-06-09 NOTE — Telephone Encounter (Signed)
Patient requested she need the nurse to call her. Patient would not say the reason for the call.

## 2023-06-09 NOTE — Telephone Encounter (Signed)
Call placed to patient unable to reach message left on VM.   

## 2023-06-10 ENCOUNTER — Ambulatory Visit: Payer: Self-pay

## 2023-06-10 NOTE — Telephone Encounter (Signed)
Patient advised that methocarbamol (ROBAXIN) 750 MG tablet was filled on 06/07/2023 and sent to [facility] - [place], [place] - [address]  Patient voiced that she would check her mail.

## 2023-06-13 NOTE — Patient Instructions (Signed)
Visit Information  Thank you for taking time to visit with me today. Please don't hesitate to contact me if I can be of assistance to you.   Following are the goals we discussed today:   Goals Addressed             This Visit's Progress    To better manage diabetes       Care Coordination Interventions: Provided education to patient about basic DM disease process Reviewed medications with patient and discussed importance of medication adherence Advised patient, providing education and rationale, to check cbg daily before meals and at bedtime and record, calling PCP for findings outside established parameters Review of patient status, including review of consultants reports, relevant laboratory and other test results, and medications completed Counseled on Diabetic diet, my plate method, 563 minutes of moderate intensity exercise/week Mailed printed educational materials related to Diabetes Management  Lab Results  Component Value Date   HGBA1C 8.2 (A) 05/16/2023        To obtain a home BP cuff   On track    Care Coordination Interventions: Evaluation of current treatment plan related to hypertension self management and patient's adherence to plan as established by provider Review of patient status, including review of consultant's reports, relevant laboratory and other test results, and medications completed Provided education to patient re: stroke prevention, s/s of heart attack and stroke Provided education on prescribed diet low Sodium  Discussed complications of poorly controlled blood pressure such as heart disease, stroke, circulatory complications, vision complications, kidney impairment, sexual dysfunction Determined patient has not obtained a BP cuff, patient has [facility] Medicare dual coverage and may purchase from the [facility] OTC catalog  Last practice recorded BP readings:  BP Readings from Last 3 Encounters:  05/16/23 (!) 154/82  05/13/23 (!) 151/88  05/01/23 (!) 183/82    Most recent eGFR/CrCl:  Lab Results  Component Value Date   EGFR 97 02/25/2022    No components found for: "CRCL"         Our next appointment is by telephone on 06/24/23 at 1:00 PM  Please call the care guide team at [phone] if you need to cancel or reschedule your appointment.   If you are experiencing a Mental Health or Behavioral Health Crisis or need someone to talk to, please call 1-800-273-TALK (toll free, 24 hour hotline)  The patient verbalized understanding of instructions, educational materials, and care plan provided today and DECLINED offer to receive copy of patient instructions, educational materials, and care plan.

## 2023-06-13 NOTE — Patient Outreach (Signed)
  Care Coordination   Follow Up Visit Note   06/13/2023 Name: Cayton Messano MRN: [id number] DOB: 1960-06-11  Kately Acerra is a 63 y.o. year old female who sees [name], MD for primary care. I spoke with  [name] by phone today.  What matters to the patients health and wellness today?  Patient would like to obtain a BP cuff in order to self-monitor her blood pressures at home.     Goals Addressed             This Visit's Progress    To better manage diabetes       Care Coordination Interventions: Provided education to patient about basic DM disease process Reviewed medications with patient and discussed importance of medication adherence Advised patient, providing education and rationale, to check cbg daily before meals and at bedtime and record, calling PCP for findings outside established parameters Review of patient status, including review of consultants reports, relevant laboratory and other test results, and medications completed Counseled on Diabetic diet, my plate method, 409 minutes of moderate intensity exercise/week Mailed printed educational materials related to Diabetes Management  Lab Results  Component Value Date   HGBA1C 8.2 (A) 05/16/2023        To obtain a home BP cuff   On track    Care Coordination Interventions: Evaluation of current treatment plan related to hypertension self management and patient's adherence to plan as established by provider Review of patient status, including review of consultant's reports, relevant laboratory and other test results, and medications completed Provided education to patient re: stroke prevention, s/s of heart attack and stroke Provided education on prescribed diet low Sodium  Discussed complications of poorly controlled blood pressure such as heart disease, stroke, circulatory complications, vision complications, kidney impairment, sexual dysfunction Determined patient has not obtained a BP cuff,  patient has [facility] [place] Medicare dual coverage and may purchase from the [facility] OTC catalog  Last practice recorded BP readings:  BP Readings from Last 3 Encounters:  05/16/23 (!) 154/82  05/13/23 (!) 151/88  05/01/23 (!) 183/82   Most recent eGFR/CrCl:  Lab Results  Component Value Date   EGFR 97 02/25/2022    No components found for: "CRCL"     Interventions Today    Flowsheet Row Most Recent Value  Chronic Disease   Chronic disease during today's visit Hypertension (HTN), Diabetes  General Interventions   General Interventions Discussed/Reviewed General Interventions Discussed, General Interventions Reviewed, Labs, Doctor Visits, Durable Medical Equipment (DME)  Doctor Visits Discussed/Reviewed Doctor Visits Discussed, Doctor Visits Reviewed, PCP  Durable Medical Equipment (DME) BP Cuff  Education Interventions   Education Provided Provided Education  Provided Verbal Education On When to see the doctor, Medication, Other  [Blood Pressure Monitoring]  Pharmacy Interventions   Pharmacy Dicussed/Reviewed Pharmacy Topics Reviewed, Pharmacy Topics Discussed, Medications and their functions, Medication Adherence, Affording Medications          SDOH assessments and interventions completed:  Yes  SDOH Interventions Today    Flowsheet Row Most Recent Value  SDOH Interventions   Food Insecurity Interventions Intervention Not Indicated  Housing Interventions Intervention Not Indicated  Transportation Interventions Intervention Not Indicated  Utilities Interventions AMB Referral  Remigio Eisenmenger BSW]        Care Coordination Interventions:  Yes, provided   Follow up plan: Follow up call scheduled for 06/24/23 @1 :00 PM    Encounter Outcome:  Patient Visit Completed

## 2023-06-20 ENCOUNTER — Other Ambulatory Visit: Payer: Self-pay | Admitting: Family Medicine

## 2023-06-20 DIAGNOSIS — I152 Hypertension secondary to endocrine disorders: Secondary | ICD-10-CM

## 2023-06-20 DIAGNOSIS — E1142 Type 2 diabetes mellitus with diabetic polyneuropathy: Secondary | ICD-10-CM

## 2023-06-20 DIAGNOSIS — F32A Depression, unspecified: Secondary | ICD-10-CM

## 2023-06-20 DIAGNOSIS — I10 Essential (primary) hypertension: Secondary | ICD-10-CM

## 2023-06-20 DIAGNOSIS — R110 Nausea: Secondary | ICD-10-CM

## 2023-06-20 DIAGNOSIS — E785 Hyperlipidemia, unspecified: Secondary | ICD-10-CM

## 2023-06-20 NOTE — Telephone Encounter (Signed)
Copied from CRM [id number]. Topic: Clinical - Medication Refill >> Jun 20, 2023 12:38 PM Patsy Lager T wrote: Most Recent Primary Care Visit:  Provider: Anders Simmonds  Department[facility]  Visit Type: OFFICE VISIT  Date: 05/16/2023  Medication: gabapentin (NEURONTIN) 300 MG capsule, aspirin EC 81 MG tablet, lisinopril (ZESTRIL) 40 MG tablet and Fish Oil  Has the patient contacted their pharmacy? No  Is this the correct pharmacy for this prescription? Yes  This is the patient's preferred pharmacy:  [facility] - [place], [place] - [address] [place] [address] Phone: [phone] Fax: [phone]  Has the prescription been filled recently? Yes  Is the patient out of the medication? Yes  Has the patient been seen for an appointment in the last year OR does the patient have an upcoming appointment? Yes  Can we respond through MyChart? No  Agent: Please be advised that Rx refills may take up to 3 business days. We ask that you follow-up with your pharmacy.

## 2023-06-21 ENCOUNTER — Other Ambulatory Visit: Payer: Self-pay | Admitting: Family Medicine

## 2023-06-21 DIAGNOSIS — Z1231 Encounter for screening mammogram for malignant neoplasm of breast: Secondary | ICD-10-CM

## 2023-06-21 MED ORDER — GABAPENTIN 300 MG PO CAPS: 600.0000 mg | ORAL_CAPSULE | Freq: Two times a day (BID) | ORAL | 2 refills | Status: DC

## 2023-06-21 MED ORDER — LISINOPRIL 40 MG PO TABS
40.0000 mg | ORAL_TABLET | Freq: Every day | ORAL | 1 refills | Status: DC
  Filled 2023-07-15: qty 90, 90d supply, fill #0

## 2023-06-22 ENCOUNTER — Encounter: Payer: Self-pay | Admitting: Physician Assistant

## 2023-06-22 ENCOUNTER — Ambulatory Visit: Payer: 59 | Attending: Physician Assistant | Admitting: Physician Assistant

## 2023-06-22 ENCOUNTER — Other Ambulatory Visit: Payer: Self-pay

## 2023-06-22 ENCOUNTER — Telehealth: Payer: Self-pay | Admitting: *Deleted

## 2023-06-22 VITALS — BP 147/86 | HR 74 | Wt 188.6 lb

## 2023-06-22 DIAGNOSIS — E1169 Type 2 diabetes mellitus with other specified complication: Secondary | ICD-10-CM

## 2023-06-22 DIAGNOSIS — M5442 Lumbago with sciatica, left side: Secondary | ICD-10-CM

## 2023-06-22 DIAGNOSIS — G8929 Other chronic pain: Secondary | ICD-10-CM | POA: Diagnosis not present

## 2023-06-22 DIAGNOSIS — M5441 Lumbago with sciatica, right side: Secondary | ICD-10-CM | POA: Diagnosis not present

## 2023-06-22 DIAGNOSIS — Z794 Long term (current) use of insulin: Secondary | ICD-10-CM

## 2023-06-22 DIAGNOSIS — Z7984 Long term (current) use of oral hypoglycemic drugs: Secondary | ICD-10-CM

## 2023-06-22 DIAGNOSIS — E118 Type 2 diabetes mellitus with unspecified complications: Secondary | ICD-10-CM

## 2023-06-22 LAB — GLUCOSE, POCT (MANUAL RESULT ENTRY): POC Glucose: 107 mg/dL — AB (ref 70–99)

## 2023-06-22 MED ORDER — COMFORT EZ PEN NEEDLES 31G X 5 MM MISC
1.0000 | Freq: Two times a day (BID) | 3 refills | Status: DC
  Filled 2023-06-22: qty 100, 50d supply, fill #0

## 2023-06-22 MED ORDER — METHOCARBAMOL 750 MG PO TABS
750.0000 mg | ORAL_TABLET | Freq: Three times a day (TID) | ORAL | 0 refills | Status: DC | PRN
  Filled 2023-06-22: qty 90, 30d supply, fill #0

## 2023-06-22 MED ORDER — ONETOUCH VERIO VI STRP
ORAL_STRIP | 12 refills | Status: DC
  Filled 2023-06-22: qty 100, 33d supply, fill #0

## 2023-06-22 MED ORDER — PREDNISONE 10 MG PO TABS
ORAL_TABLET | ORAL | 0 refills | Status: AC
  Filled 2023-06-22: qty 21, 6d supply, fill #0

## 2023-06-22 MED ORDER — ONETOUCH DELICA PLUS LANCET33G MISC
12 refills | Status: DC
  Filled 2023-06-22: qty 100, 33d supply, fill #0

## 2023-06-22 MED ORDER — KETOROLAC TROMETHAMINE 30 MG/ML IJ SOLN
30.0000 mg | Freq: Once | INTRAMUSCULAR | Status: AC
  Administered 2023-06-22: 30 mg via INTRAMUSCULAR

## 2023-06-22 NOTE — Progress Notes (Signed)
Patient ID: Sarah Charles, female   DOB: January 08, 1961, 63 y.o.   MRN: [id number]   Sarah Charles, is a 63 y.o. female  [id number]  [id number]  DOB - [birth year]  Chief Complaint  Patient presents with   Back Pain    Causing weakness in knees        Subjective:   Sarah Charles is a 63 y.o. female here today for R sciatica >L.  She has been having low back pain now that makes both of her legs feel weak for about 6 months.  Now the pain has worsened over the last 7 days and is radiating into her R leg.  No new paresthesias.  Blood sugars running under 140 for the last 2 weeks.  She has been limping some.  She has a walker at home but does not want to use it.  No problems moving bowel or bladder.  No urinary s/sx  No problems updated.  ALLERGIES: Allergies  Allergen Reactions   Cymbalta [Duloxetine Hcl] Nausea Only and Other (See Comments)    Nausea, lack of therapeutic effect    PAST MEDICAL HISTORY: Past Medical History:  Diagnosis Date   Arthritis    Coronary artery calcification seen on CT scan    a. 07/2017 noted on CT abd.   Depression    GERD (gastroesophageal reflux disease)    Hyperlipidemia    Hypertension    Obesity    Sleep apnea    a. Does not use CPAP "cause i dont think I need it anymore".   Tobacco abuse    Type II diabetes mellitus (HCC)     MEDICATIONS AT HOME: Prior to Admission medications   Medication Sig Start Date End Date Taking? Authorizing Provider  aspirin EC 81 MG tablet Take 1 tablet (81 mg total) by mouth daily. 02/08/23  Yes Narda Bonds, MD  benzonatate (TESSALON) 100 MG capsule Take 1 capsule (100 mg total) by mouth every 8 (eight) hours as needed for cough. 05/13/23  Yes White, Elizabeth A, PA-C  Blood Glucose Monitoring Suppl (ONETOUCH VERIO) w/Device KIT Use to test blood sugar 3 times a day 02/09/22  Yes Newlin, Enobong, MD  buPROPion (WELLBUTRIN SR) 150 MG 12 hr tablet TAKE 1 TABLET TWICE DAILY 06/20/23  Yes [name], MD   carvedilol (COREG) 3.125 MG tablet Take 1 tablet (3.125 mg total) by mouth 2 (two) times daily with a meal. 05/16/23  Yes Thaily Hackworth M, PA-C  cetirizine (ZYRTEC) 10 MG tablet TAKE 1 TABLET BY MOUTH EVERY DAY Patient taking differently: Take 10 mg by mouth daily. 08/02/19  Yes Newlin, Odette Horns, MD  clotrimazole (LOTRIMIN) 1 % cream APPLY TO THE AFFECTED AREA(S) beneath BOTH breasts TWICE DAILY Patient taking differently: Apply 1 Application topically 2 (two) times daily. 03/08/22  Yes [name], MD  diclofenac Sodium (VOLTAREN) 1 % GEL Apply 4 g topically 4 (four) times daily. 05/05/21  Yes [name], MD  diphenhydrAMINE (BENADRYL) 25 MG tablet Take 1 tablet (25 mg total) by mouth every 6 (six) hours as needed. 05/01/23  Yes Rinaldo Ratel, [place] N, FNP  fenofibrate (TRICOR) 145 MG tablet TAKE 1 TABLET EVERY EVENING 06/20/23  Yes Newlin, Odette Horns, MD  FLUoxetine (PROZAC) 20 MG capsule TAKE 1 CAPSULE EVERY MORNING 06/20/23  Yes Newlin, Enobong, MD  furosemide (LASIX) 20 MG tablet Take 1 tablet (20 mg total) by mouth daily. 05/16/23  Yes Anders Simmonds, PA-C  gabapentin (NEURONTIN) 300 MG capsule Take 2 capsules (600 mg total)  by mouth 2 (two) times daily. 06/21/23  Yes [name], MD  glucose blood (ONETOUCH VERIO) test strip Use as instructed 02/09/22  Yes Newlin, Enobong, MD  insulin glargine (LANTUS SOLOSTAR) 100 UNIT/ML Solostar Pen Inject 33 Units into the skin 2 (two) times daily. 05/16/23  Yes Angello Chien M, PA-C  Insulin Pen Needle (COMFORT EZ PEN NEEDLES) 31G X 5 MM MISC USE AS DIRECTED ONCE DAILY 09/16/21  Yes [name], MD  Lancets (ONETOUCH DELICA PLUS LANCET33G) MISC Use to test blood sugar 3 times a day 02/09/22  Yes Newlin, Enobong, MD  Lancets Misc. (ACCU-CHEK FASTCLIX LANCET) KIT USE AS DIRECTED 11/12/21  Yes Newlin, Enobong, MD  lidocaine (LIDODERM) 5 % Place ONE PATCH onto THE SKIN daily. REMOVE & Discard PATCH WITHIN 12 hours OR as directed by MD Patient  taking differently: Place 1 patch onto the skin every 12 (twelve) hours. 12/30/22  Yes [name], MD  lisinopril (ZESTRIL) 40 MG tablet Take 1 tablet (40 mg total) by mouth daily. 06/21/23  Yes [name], MD  metFORMIN (GLUCOPHAGE) 500 MG tablet Take 2 tablets (1,000 mg total) by mouth 2 (two) times daily. TAKE TWO TABLETS BY MOUTH TWICE DAILY WITH A MEAL Strength: 500 mg 05/16/23  Yes Kayse Puccini M, PA-C  metoCLOPramide (REGLAN) 10 MG tablet TAKE 1 TABLET EVERY 8 HOURS AS NEEDED FOR NAUSEA AND VOMITING 06/20/23  Yes [name], MD  Multiple Vitamin (MULTIVITAMIN) tablet Take 1 tablet by mouth daily.   Yes [provider]  mupirocin ointment (BACTROBAN) 2 % Apply 1 Application topically 2 (two) times daily. 05/01/23  Yes Rinaldo Ratel, [place] N, FNP  nitroGLYCERIN (NITROSTAT) 0.4 MG SL tablet Place 0.4 mg under the tongue every 5 (five) minutes as needed for chest pain.   Yes [provider]  oxyCODONE (OXY IR/ROXICODONE) 5 MG immediate release tablet Take 5 mg by mouth every 4 (four) hours as needed for moderate pain. 11/29/22  Yes [provider]  pantoprazole (PROTONIX) 40 MG tablet Take 1 tablet (40 mg total) by mouth 2 (two) times daily. 12/23/22  Yes Lewie Chamber, MD  permethrin (ELIMITE) 5 % cream APPLY 1 APPLICATION TOPICALLY ONCE FOR 1 DOSE. FROM HEAD TO TOE AND LEAVE IN PLACE FOR 8 TO 14 HOURS AND WASHOUT 06/21/23  Yes [name], MD  predniSONE (DELTASONE) 10 MG tablet 6,5,4,3,2,1 take each days dose at once in morning with food 06/22/23  Yes Viola Kinnick M, PA-C  rosuvastatin (CRESTOR) 40 MG tablet Take 1 tablet (40 mg total) by mouth every evening. 01/28/23  Yes [name], MD  silver sulfADIAZINE (SILVADENE) 1 % cream Apply 1 Application topically daily. 11/21/22  Yes Lonell Grandchild, MD  terbinafine (LAMISIL) 250 MG tablet Please take one a day x 7days, repeat every 4 weeks x 4 months Patient taking differently: Take 250 mg by mouth daily.  05/07/21  Yes Lenn Sink, DPM  traZODone (DESYREL) 50 MG tablet TAKE 1 TABLET AT BEDTIME 04/13/23  Yes Newlin, Enobong, MD  triamcinolone cream (KENALOG) 0.1 % Apply 1 Application topically 2 (two) times daily. 02/22/23  Yes [name], MD  vitamin B-12 (CYANOCOBALAMIN) 500 MCG tablet TAKE ONE TABLET BY MOUTH ONCE DAILY Patient taking differently: Take 500 mcg by mouth daily. 01/10/23  Yes [name], MD  vitamin C (ASCORBIC ACID) 500 MG tablet Take 500 mg by mouth daily.   Yes [provider]  amoxicillin-clavulanate (AUGMENTIN) 875-125 MG tablet Take 1 tablet by mouth every 12 (twelve) hours.  Patient not taking: Reported on 06/22/2023 05/13/23   Landis Martins, PA-C  methocarbamol (ROBAXIN) 750 MG tablet Take 1 tablet (750 mg total) by mouth every 8 (eight) hours as needed for muscle spasms. 06/22/23   Edoardo Laforte, Marzella Schlein, PA-C    ROS: Neg HEENT Neg resp Neg cardiac Neg GI Neg GU Neg psych Neg neuro  Objective:   Vitals:   06/22/23 1435  BP: (!) 147/86  Pulse: 74  SpO2: 96%  Weight: 188 lb 9.6 oz (85.5 kg)   Exam General appearance : Awake, alert, not in any distress. Speech Clear. Not toxic looking HEENT: Atraumatic and Normocephalic, pupils equally reactive to light and accomodation Neck: Supple, no JVD. No cervical lymphadenopathy.  Chest: Good air entry bilaterally, CTAB.  No rales/rhonchi/wheezing CVS: S1 S2 regular, no murmurs.  TTP middle low back and B lumbar region.  Neg SLR B Extremities: B/L Lower Ext shows no edema, both legs are warm to touch Neurology: Awake alert, and oriented X 3, CN II-XII intact, Non focal Skin: No Rash  Data Review Lab Results  Component Value Date   HGBA1C 8.2 (A) 05/16/2023   HGBA1C 10.7 (H) 02/03/2023   HGBA1C 9.5 (A) 11/23/2022    Assessment & Plan   1. Type 2 diabetes mellitus with other specified complication, with long-term current use of insulin (HCC) (Primary) Blood sugar is good - Glucose  (CBG)  2. Chronic midline low back pain with bilateral sciatica No red flags today - ketorolac (TORADOL) 30 MG/ML injection 30 mg - Ambulatory referral to Orthopedic Surgery - predniSONE (DELTASONE) 10 MG tablet; 6,5,4,3,2,1 take each days dose at once in morning with food  Dispense: 21 tablet; Refill: 0 - methocarbamol (ROBAXIN) 750 MG tablet; Take 1 tablet (750 mg total) by mouth every 8 (eight) hours as needed for muscle spasms.  Dispense: 90 tablet; Refill: 0    Return in about 3 months (around 09/20/2023) for PCP for chronic conditions-Newlin.  The patient was given clear instructions to go to ER or return to medical center if symptoms don't improve, worsen or new problems develop. The patient verbalized understanding. The patient was told to call to get lab results if they haven't heard anything in the next week.      Georgian Co, PA-C [facility] [place], [place] [phone]   06/22/2023, 3:37 PM

## 2023-06-22 NOTE — Progress Notes (Signed)
Complex Care Management Care Guide Note  06/22/2023 Name: Sarah Charles MRN: [id number] DOB: [birth year]  [name] is a 63 y.o. year old female who is a primary care patient of [name], MD and is actively engaged with the care management team. I reached out to [name] by phone today to assist with re-scheduling  with the RN Case Manager.  Follow up plan: Unsuccessful telephone outreach attempt made. A HIPAA compliant phone message was left for the patient providing contact information and requesting a return call.  Gwenevere Ghazi  [facility]  Value-Based Care Institute, [facility] Guide  Direct Dial: [phone]  Fax [phone]

## 2023-06-22 NOTE — Patient Instructions (Signed)
Sciatica  Sciatica is pain, weakness, tingling, or loss of feeling (numbness) along the sciatic nerve. The sciatic nerve starts in the lower back and goes down the back of each leg. Sciatica usually affects one side of the body. Sciatica usually goes away on its own or with treatment. Sometimes, sciatica may come back. What are the causes? This condition happens when the sciatic nerve is pinched or has pressure put on it. This may be caused by: A disk in between the bones of the spine bulging out too far (herniated disk). Changes in the spinal disks due to aging. A condition that affects a muscle in the butt. Extra bone growth near the sciatic nerve. A break (fracture) of the area between your hip bones (pelvis). Pregnancy. Tumor. This is rare. What increases the risk? You are more likely to develop this condition if you: Play sports that put pressure or stress on the spine. Have poor strength and ease of movement (flexibility). Have had a back injury or back surgery. Sit for long periods of time. Do activities that involve bending or lifting over and over again. Are very overweight (obese). What are the signs or symptoms? Symptoms can vary from mild to very bad. They may include: Any of these problems in the lower back, leg, hip, or butt: Mild tingling, loss of feeling, or dull aches. A burning feeling. Damiano pains. Loss of feeling in the back of the calf or the sole of the foot. Leg weakness. Very bad back pain that makes it hard to move. These symptoms may get worse when you cough, sneeze, or laugh. They may also get worse when you sit or stand for long periods of time. How is this treated? This condition often gets better without any treatment. However, treatment may include: Changing or cutting back on physical activity when you have pain. Exercising, including strengthening and stretching. Putting ice or heat on the affected area. Shots of medicines to relieve pain and  swelling or to relax your muscles. Surgery. Follow these instructions at home: Medicines Take over-the-counter and prescription medicines only as told by your doctor. Ask your doctor if you should avoid driving or using machines while you are taking your medicine. Managing pain     If told, put ice on the affected area. To do this: Put ice in a plastic bag. Place a towel between your skin and the bag. Leave the ice on for 20 minutes, 2-3 times a day. If your skin turns bright red, take off the ice right away to prevent skin damage. The risk of skin damage is higher if you cannot feel pain, heat, or cold. If told, put heat on the affected area. Do this as often as told by your doctor. Use the heat source that your doctor tells you to use, such as a moist heat pack or a heating pad. Place a towel between your skin and the heat source. Leave the heat on for 20-30 minutes. If your skin turns bright red, take off the heat right away to prevent burns. The risk of burns is higher if you cannot feel pain, heat, or cold. Activity  Return to your normal activities when your doctor says that it is safe. Avoid activities that make your symptoms worse. Take short rests during the day. When you rest for a long time, do some physical activity or stretching between periods of rest. Avoid sitting for a long time without moving. Get up and move around at least one time each  hour. Do exercises and stretches as told by your doctor. Do not lift anything that is heavier than 10 lb (4.5 kg). Avoid lifting heavy things even when you do not have symptoms. Avoid lifting heavy things over and over. When you lift objects, always lift in a way that is safe for your body. To do this, you should: Bend your knees. Keep the object close to your body. Avoid twisting. General instructions Stay at a healthy weight. Wear comfortable shoes that support your feet. Avoid wearing high heels. Avoid sleeping on a mattress  that is too soft or too hard. You might have less pain if you sleep on a mattress that is firm enough to support your back. Contact a doctor if: Your pain is not controlled by medicine. Your pain does not get better. Your pain gets worse. Your pain lasts longer than 4 weeks. You lose weight without trying. Get help right away if: You cannot control when you pee (urinate) or poop (have a bowel movement). You have weakness in any of these areas and it gets worse: Lower back. The area between your hip bones. Butt. Legs. You have redness or swelling of your back. You have a burning feeling when you pee. Summary Sciatica is pain, weakness, tingling, or loss of feeling (numbness) along the sciatic nerve. This may include the lower back, legs, hips, and butt. This condition happens when the sciatic nerve is pinched or has pressure put on it. Treatment often includes rest, exercise, medicines, and putting ice or heat on the affected area. This information is not intended to replace advice given to you by your health care provider. Make sure you discuss any questions you have with your health care provider. Document Revised: 08/17/2021 Document Reviewed: 08/17/2021 Elsevier Patient Education  2024 [employer].

## 2023-06-22 NOTE — Progress Notes (Signed)
Complex Care Management Care Guide Note  06/22/2023 Name: Taleyah Hillman MRN: [id number] DOB: 1960/11/05  Kaliegh Willadsen is a 63 y.o. year old female who is a primary care patient of [name], MD and is actively engaged with the care management team. I reached out to [name] by phone today to assist with scheduling  with the RN Case Manager.  Follow up plan: Telephone appointment with complex care management team member scheduled for:  2/21  Gwenevere Ghazi  [facility]  Value-Based Care Institute, [facility] Guide  Direct Dial: [phone]  Fax [phone]

## 2023-06-23 ENCOUNTER — Other Ambulatory Visit: Payer: Self-pay | Admitting: Physician Assistant

## 2023-06-23 ENCOUNTER — Other Ambulatory Visit: Payer: Self-pay | Admitting: Family Medicine

## 2023-06-23 ENCOUNTER — Telehealth: Payer: Self-pay

## 2023-06-23 MED ORDER — ONETOUCH VERIO W/DEVICE KIT: PACK | 0 refills | Status: DC

## 2023-06-23 NOTE — Telephone Encounter (Signed)
Copied from CRM [id number]. Topic: Clinical - Medication Question >> Jun 23, 2023  2:46 PM Victorino Dike T wrote: Reason for CRM: Need a new blood glucose monitor, neighbors dog chewed it up, Blood Glucose Monitoring Suppl [facility] VERIO) please call patient [phone]   *[place] ordered meter, seen other message regarding this *([facility])

## 2023-06-23 NOTE — Telephone Encounter (Signed)
Copied from CRM [id number]. Topic: Clinical - Medication Refill >> Jun 23, 2023  9:44 AM Nada Libman H wrote: Most Recent Primary Care Visit:  Provider: Anders Simmonds  Department: [facility]  Visit Type: OFFICE VISIT  Date: 06/22/2023  Medication: Blood Glucose Monitoring Suppl [facility] VERIO) w/Device KIT [[id number]]  Has the patient contacted their pharmacy? Yes (Agent: If no, request that the patient contact the pharmacy for the refill. If patient does not wish to contact the pharmacy document the reason why and proceed with request.) (Agent: If yes, when and what did the pharmacy advise?)  Is this the correct pharmacy for this prescription? No If no, delete pharmacy and type the correct one.  This is the patient's preferred pharmacy:    [id number] - [place], [place] - [address] [address] [place] [address] Phone: [phone] Fax: [phone]   Has the prescription been filled recently? No  Is the patient out of the medication? No  Has the patient been seen for an appointment in the last year OR does the patient have an upcoming appointment? Yes  Can we respond through MyChart? Yes  Agent: Please be advised that Rx refills may take up to 3 business days. We ask that you follow-up with your pharmacy.

## 2023-06-23 NOTE — Telephone Encounter (Signed)
Refill sent today per chart review, no further action needed.

## 2023-06-23 NOTE — Telephone Encounter (Signed)
Called patient and left voicemail, need to now to pharmacy she want her bs meter to be sent

## 2023-06-24 ENCOUNTER — Telehealth: Payer: Self-pay

## 2023-06-24 ENCOUNTER — Other Ambulatory Visit: Payer: Self-pay | Admitting: Pharmacist

## 2023-06-24 DIAGNOSIS — E1169 Type 2 diabetes mellitus with other specified complication: Secondary | ICD-10-CM

## 2023-06-24 MED ORDER — ACCU-CHEK GUIDE TEST VI STRP: ORAL_STRIP | 6 refills | Status: DC

## 2023-06-24 MED ORDER — ACCU-CHEK GUIDE W/DEVICE KIT: PACK | 0 refills | Status: DC

## 2023-06-24 MED ORDER — ACCU-CHEK SOFTCLIX LANCETS MISC: 6 refills | Status: DC

## 2023-06-24 NOTE — Telephone Encounter (Signed)
Copied from CRM [id number]. Topic: Clinical - Medical Advice >> Jun 24, 2023 10:11 AM Jorje Guild R wrote: Reason for CRM:  Patient wants nurse to call due to showing in system the [facility] for Blood Glucose Monitoring is sent in and at pharmacy, when patient calls they are stating they dont have her items. Patient is requesting a call to know what is going on so she can get her readings done.   DONE:   [facility] and [facility] stated that [facility] insurance did not cover the [employer], I talked with Franky Macho and he sent over new order for accu-check kit. Called patient and informed her that new order was sent to her [facility] [place])

## 2023-06-24 NOTE — Telephone Encounter (Unsigned)
Copied from CRM [id number]. Topic: Referral - Request for Referral >> Jun 24, 2023 11:16 AM Payton Doughty wrote: Did the patient discuss referral with their provider in the last year? No (If No - schedule appointment) (If Yes - send message)  Appointment offered? No  Type of order/referral and detailed reason for visit: optometrist (needs eyes checked)  Preference of office, provider, location: any office in [place]  If referral order, have you been seen by this specialty before? Yes (If Yes, this issue or another issue? When? Where? Her other eye dr doesn't accept her insurance anymore Pt has new insurance starting tomorrow it is [employer].  Also has medicaid. Pt states her new card is in her car and she did not attempt to go and get. Can we respond through MyChart? No Pt would like a call back please.

## 2023-06-24 NOTE — Telephone Encounter (Addendum)
optometrist  referral sent. Call to notify patient unable to leave message recording states unable to reach at this time.

## 2023-06-28 ENCOUNTER — Ambulatory Visit: Payer: Self-pay | Admitting: Licensed Clinical Social Worker

## 2023-06-28 NOTE — Patient Instructions (Signed)
Visit Information  Thank you for taking time to visit with me today. Please don't hesitate to contact me if I can be of assistance to you.   Following are the goals we discussed today:   Goals Addressed             This Visit's Progress    Care Coordination Activities       Care Coordination Interventions: Patient stated that she is living with a friend and wants her own place, she receives $945 in SSI, $76 in Food stamps and from her insurance a card with about $325 to cover medical and prescriptions and utility bills. The SW will mail some housing resources The SW completed the SDOH questions and no other needs. Sw will follow up on 07/13/2023 at 11:00 am        Our next appointment is by telephone on 07/13/2023 at 11:00 am  Please call the care guide team at [phone] if you need to cancel or reschedule your appointment.   If you are experiencing a Mental Health or Behavioral Health Crisis or need someone to talk to, please call the Suicide and Crisis Lifeline: 988 go to [facility] [address] [place] [phone]) call 911  The patient verbalized understanding of instructions, educational materials, and care plan provided today and DECLINED offer to receive copy of patient instructions, educational materials, and care plan.   Jeanie Cooks, PhD [facility], [facility] Social Worker Direct Dial: [phone]  Fax: [phone]

## 2023-06-28 NOTE — Patient Outreach (Signed)
  Care Coordination   Initial Visit Note   06/28/2023 Name: Sarah Charles MRN: [id number] DOB: [birth year]  Sarah Charles is a 64 y.o. year old female who sees Sarah Clam, MD for primary care. I spoke with  Sarah Charles by phone today.  What matters to the patients health and wellness today?  Housing    Goals Addressed             This Visit's Progress    Care Coordination Activities       Care Coordination Interventions: Patient stated that she is living with a friend and wants her own place, she receives $945 in SSI, $69 in Food stamps and from her insurance a card with about $325 to cover medical and prescriptions and utility bills. The SW will mail some housing resources The SW completed the SDOH questions and no other needs. Sw will follow up on 07/13/2023 at 11:00 am        SDOH assessments and interventions completed:  Yes  SDOH Interventions Today    Flowsheet Row Most Recent Value  SDOH Interventions   Food Insecurity Interventions Intervention Not Indicated  Housing Interventions Community Resources Provided  [Wanting her own place, Sw will mail out housing resources]  Transportation Interventions Intervention Not Indicated  Utilities Interventions --  [lives with friend and does not have to pay]        Care Coordination Interventions:  Yes, provided  Interventions Today    Flowsheet Row Most Recent Value  General Interventions   General Interventions Discussed/Reviewed General Interventions Discussed  [Patient wants her own place, SW will mail out resources]        Follow up plan: Follow up call scheduled for 07/13/2023 @ 11:00 am    Encounter Outcome:  Patient Visit Completed   [name], PhD [facility], [facility] Social Worker Direct Dial: [phone]  Fax: [phone]

## 2023-06-30 NOTE — Telephone Encounter (Signed)
 Copied from CRM [id number]. Topic: Referral - Request for Referral >> Jun 30, 2023  9:24 AM Myrick T wrote: Did the patient discuss referral with their provider in the last year? No  Appointment offered? No  Type of order/referral and detailed reason for visit: Ophthalmologist  Preference of office, provider, location: unknown  If referral order, have you been seen by this specialty before? No (If Yes, this issue or another issue? When? Where?  Can we respond through MyChart? No

## 2023-06-30 NOTE — Telephone Encounter (Signed)
 Copied from CRM [id number]. Topic: Appointments - Scheduling Inquiry for Clinic >> Jun 28, 2023 11:08 AM Graeme ORN wrote: Reason for CRM: Patient called to make appt for pneumonia vaccine. Advised patient that per record last one 02/06/2023. Appt not scheduled. Thank You

## 2023-06-30 NOTE — Telephone Encounter (Addendum)
 Sent referral to [facility]  [facility]) [phone] [address] [place], [place] [address] [phone] Fax [phone]  The referral was placed 06/24/2023.   Patient is aware of the phone and location. Advised to  call office and to let them know a referral was placed.    Pt verbalized understanding.

## 2023-06-30 NOTE — Telephone Encounter (Addendum)
Patient advised that she is up to date on PNA vaccines. Does not need additional vaccines for PNA.

## 2023-07-04 ENCOUNTER — Inpatient Hospital Stay: Admission: RE | Admit: 2023-07-04 | Payer: 59 | Source: Ambulatory Visit

## 2023-07-04 ENCOUNTER — Emergency Department (HOSPITAL_COMMUNITY)
Admission: EM | Admit: 2023-07-04 | Discharge: 2023-07-04 | Disposition: A | Discharge status: Home / Self Care | Payer: 59 | Attending: Emergency Medicine | Admitting: Emergency Medicine

## 2023-07-04 ENCOUNTER — Other Ambulatory Visit: Payer: Self-pay

## 2023-07-04 ENCOUNTER — Emergency Department (HOSPITAL_COMMUNITY): Payer: Medicare HMO

## 2023-07-04 DIAGNOSIS — S300XXA Contusion of lower back and pelvis, initial encounter: Secondary | ICD-10-CM | POA: Insufficient documentation

## 2023-07-04 DIAGNOSIS — I10 Essential (primary) hypertension: Secondary | ICD-10-CM | POA: Diagnosis not present

## 2023-07-04 DIAGNOSIS — W19XXXA Unspecified fall, initial encounter: Secondary | ICD-10-CM | POA: Diagnosis not present

## 2023-07-04 DIAGNOSIS — M47816 Spondylosis without myelopathy or radiculopathy, lumbar region: Secondary | ICD-10-CM | POA: Diagnosis not present

## 2023-07-04 DIAGNOSIS — M533 Sacrococcygeal disorders, not elsewhere classified: Secondary | ICD-10-CM | POA: Diagnosis not present

## 2023-07-04 DIAGNOSIS — Z7982 Long term (current) use of aspirin: Secondary | ICD-10-CM | POA: Insufficient documentation

## 2023-07-04 MED ORDER — LISINOPRIL 20 MG PO TABS
40.0000 mg | ORAL_TABLET | Freq: Once | ORAL | Status: AC
  Administered 2023-07-04: 40 mg via ORAL
  Filled 2023-07-04: qty 2

## 2023-07-04 MED ORDER — CARVEDILOL 3.125 MG PO TABS
3.1250 mg | ORAL_TABLET | Freq: Once | ORAL | Status: AC
  Administered 2023-07-04: 3.125 mg via ORAL
  Filled 2023-07-04: qty 1

## 2023-07-04 NOTE — ED Provider Notes (Signed)
[place] EMERGENCY DEPARTMENT AT [facility] Provider Note   CSN: [id number] Arrival date & time: 07/04/23  1005     History  Chief Complaint  Patient presents with   Hypertension   Fall   Rectal Pain    Sarah Charles is a 63 y.o. female.  Patient complains of being out of her blood pressure medicine.  Patient states the mail order company has not sent her her blood pressure medicine.  Patient also reports that she throat fell 3 days ago and landed on her bottom.  Patient is concerned that she could have broke her tailbone.  She denies any impact of her head she did not have any loss of consciousness.  Patient denies any other area of injury.  Patient states her biggest concern is that her blood pressure has been high since she has not had her medications.  Patient reports that she took her medicines 3 days ago  The history is provided by the patient. No language interpreter was used.  Hypertension This is a new problem. The problem occurs constantly. Nothing aggravates the symptoms.  Fall       Home Medications Prior to Admission medications   Medication Sig Start Date End Date Taking? Authorizing Provider  aspirin EC 81 MG tablet Take 1 tablet (81 mg total) by mouth daily. 02/08/23  Yes Narda Bonds, MD  benzonatate (TESSALON) 100 MG capsule Take 1 capsule (100 mg total) by mouth every 8 (eight) hours as needed for cough. 05/13/23  Yes White, Elizabeth A, PA-C  buPROPion (WELLBUTRIN SR) 150 MG 12 hr tablet TAKE 1 TABLET TWICE DAILY Patient taking differently: Take 150 mg by mouth daily. 06/20/23  Yes [name], MD  carvedilol (COREG) 3.125 MG tablet Take 1 tablet (3.125 mg total) by mouth 2 (two) times daily with a meal. 05/16/23  Yes McClung, Angela M, PA-C  cetirizine (ZYRTEC) 10 MG tablet TAKE 1 TABLET BY MOUTH EVERY DAY Patient taking differently: Take 10 mg by mouth daily. 08/02/19  Yes Newlin, Odette Horns, MD  clotrimazole (LOTRIMIN) 1 % cream APPLY TO  THE AFFECTED AREA(S) beneath BOTH breasts TWICE DAILY Patient taking differently: Apply 1 Application topically 2 (two) times daily. 03/08/22  Yes [name], MD  diclofenac Sodium (VOLTAREN) 1 % GEL Apply 4 g topically 4 (four) times daily. 05/05/21  Yes [name], MD  diphenhydrAMINE (BENADRYL) 25 MG tablet Take 1 tablet (25 mg total) by mouth every 6 (six) hours as needed. 05/01/23  Yes Rinaldo Ratel, [place] N, FNP  fenofibrate (TRICOR) 145 MG tablet TAKE 1 TABLET EVERY EVENING 06/20/23  Yes Newlin, Odette Horns, MD  FLUoxetine (PROZAC) 20 MG capsule TAKE 1 CAPSULE EVERY MORNING 06/20/23  Yes Newlin, Enobong, MD  furosemide (LASIX) 20 MG tablet Take 1 tablet (20 mg total) by mouth daily. 05/16/23  Yes Georgian Co M, PA-C  gabapentin (NEURONTIN) 300 MG capsule Take 2 capsules (600 mg total) by mouth 2 (two) times daily. 06/21/23  Yes Newlin, Odette Horns, MD  insulin glargine (LANTUS SOLOSTAR) 100 UNIT/ML Solostar Pen Inject 33 Units into the skin 2 (two) times daily. Patient taking differently: Inject 60 Units into the skin daily. 05/16/23  Yes McClung, Marylene Land M, PA-C  lidocaine (LIDODERM) 5 % Place ONE PATCH onto THE SKIN daily. REMOVE & Discard PATCH WITHIN 12 hours OR as directed by MD Patient taking differently: Place 1 patch onto the skin every 12 (twelve) hours. 12/30/22  Yes [name], MD  lisinopril (ZESTRIL) 40 MG tablet Take  1 tablet (40 mg total) by mouth daily. 06/21/23  Yes [name], MD  metFORMIN (GLUCOPHAGE) 500 MG tablet Take 2 tablets (1,000 mg total) by mouth 2 (two) times daily. TAKE TWO TABLETS BY MOUTH TWICE DAILY WITH A MEAL Strength: 500 mg Patient taking differently: Take 1,000 mg by mouth daily with breakfast. 05/16/23  Yes McClung, Angela M, PA-C  methocarbamol (ROBAXIN) 750 MG tablet Take 1 tablet (750 mg total) by mouth every 8 (eight) hours as needed for muscle spasms. 06/22/23  Yes McClung, Angela M, PA-C  metoCLOPramide (REGLAN) 10 MG tablet TAKE 1 TABLET EVERY  8 HOURS AS NEEDED FOR NAUSEA AND VOMITING 06/20/23  Yes [name], MD  Multiple Vitamin (MULTIVITAMIN) tablet Take 1 tablet by mouth daily.   Yes [provider]  mupirocin ointment (BACTROBAN) 2 % Apply 1 Application topically 2 (two) times daily. 05/01/23  Yes Rinaldo Ratel, [place] N, FNP  nitroGLYCERIN (NITROSTAT) 0.4 MG SL tablet Place 0.4 mg under the tongue every 5 (five) minutes as needed for chest pain.   Yes [provider]  oxyCODONE (OXY IR/ROXICODONE) 5 MG immediate release tablet Take 5 mg by mouth every 4 (four) hours as needed for moderate pain. 11/29/22  Yes [provider]  pantoprazole (PROTONIX) 40 MG tablet Take 1 tablet (40 mg total) by mouth 2 (two) times daily. 12/23/22  Yes Lewie Chamber, MD  rosuvastatin (CRESTOR) 40 MG tablet Take 1 tablet (40 mg total) by mouth every evening. 01/28/23  Yes [name], MD  traZODone (DESYREL) 50 MG tablet TAKE 1 TABLET AT BEDTIME Patient taking differently: Take 100 mg by mouth at bedtime. 04/13/23  Yes [name], MD  vitamin B-12 (CYANOCOBALAMIN) 500 MCG tablet TAKE ONE TABLET BY MOUTH ONCE DAILY Patient taking differently: Take 500 mcg by mouth daily. 01/10/23  Yes [name], MD  vitamin C (ASCORBIC ACID) 500 MG tablet Take 500 mg by mouth daily.   Yes [provider]  Accu-Chek Softclix Lancets lancets Use to check blood sugar 3 times daily. 06/24/23   [name], MD  amoxicillin-clavulanate (AUGMENTIN) 875-125 MG tablet Take 1 tablet by mouth every 12 (twelve) hours. Patient not taking: Reported on 06/22/2023 05/13/23   Landis Martins, PA-C  Blood Glucose Monitoring Suppl (ACCU-CHEK GUIDE) w/Device KIT Use to check blood sugar 3 times daily. 06/24/23   [name], MD  glucose blood (ACCU-CHEK GUIDE TEST) test strip Use to check blood sugar 3 times daily. 06/24/23   [name], MD  Insulin Pen Needle (COMFORT EZ PEN NEEDLES) 31G X 5 MM MISC Inject 1 each into the skin in the  morning and at bedtime. use as directed 06/22/23   Anders Simmonds, PA-C  Lancets Misc. (ACCU-CHEK FASTCLIX LANCET) KIT USE AS DIRECTED 11/12/21   [name], MD  permethrin (ELIMITE) 5 % cream APPLY 1 APPLICATION TOPICALLY ONCE FOR 1 DOSE. FROM HEAD TO TOE AND LEAVE IN PLACE FOR 8 TO 14 HOURS AND WASHOUT 06/21/23   [name], MD  silver sulfADIAZINE (SILVADENE) 1 % cream Apply 1 Application topically daily. Patient not taking: Reported on 07/04/2023 11/21/22   Lonell Grandchild, MD  terbinafine (LAMISIL) 250 MG tablet Please take one a day x 7days, repeat every 4 weeks x 4 months Patient not taking: Reported on 07/04/2023 05/07/21   Lenn Sink, DPM  triamcinolone cream (KENALOG) 0.1 % Apply 1 Application topically 2 (two) times daily. Patient not taking: Reported on 07/04/2023 02/22/23   [name], MD  Allergies    Cymbalta [duloxetine hcl]    Review of Systems   Review of Systems  All other systems reviewed and are negative.   Physical Exam Updated Vital Signs BP (!) 177/72   Pulse 80   Temp 98.6 F (37 C) (Oral)   Resp 20   Ht 5\' 3"  (1.6 m)   Wt 82.1 kg   SpO2 100%   BMI 32.06 kg/m  Physical Exam Vitals and nursing note reviewed.  Constitutional:      Appearance: She is well-developed.  HENT:     Head: Normocephalic.  Cardiovascular:     Rate and Rhythm: Normal rate.  Pulmonary:     Effort: Pulmonary effort is normal.  Abdominal:     General: There is no distension.  Musculoskeletal:     Cervical back: Normal range of motion.     Comments:  Tender sacral/coccyx area to palpation,  Skin:    General: Skin is warm.  Neurological:     General: No focal deficit present.     Mental Status: She is alert and oriented to person, place, and time.     ED Results / Procedures / Treatments   Labs (all labs ordered are listed, but only abnormal results are displayed) Labs Reviewed - No data to display  EKG None  Radiology DG  Sacrum/Coccyx Result Date: 07/04/2023 CLINICAL DATA:  Pain status post fall 3 days ago. EXAM: SACRUM AND COCCYX - 2+ VIEW COMPARISON:  CT abdomen/pelvis dated March 08, 2023. FINDINGS: There is no evidence of acute fracture or other focal bone lesions. Remote fracture of the right pubic body with sclerotic margins along the inferior pubic ramus. The sacroiliac joints and pubic symphysis are anatomically aligned. Degenerative changes of the visualized lower lumbar spine. IMPRESSION: 1. No acute osseous abnormality identified. 2. 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 Electronically Signed   By: Hart Robinsons M.D.   On: 07/04/2023 13:14    Procedures Procedures    Medications Ordered in ED Medications  carvedilol (COREG) tablet 3.125 mg (3.125 mg Oral Given 07/04/23 1353)  lisinopril (ZESTRIL) tablet 40 mg (40 mg Oral Given 07/04/23 1354)    ED Course/ Medical Decision Making/ A&P                                 Medical Decision Making Patient complains of being out of her blood pressure medicine and having soreness to the sacral coccyx area after falling and landing on her bottom 3 days ago  Amount and/or Complexity of Data Reviewed Radiology: ordered and independent interpretation performed. Decision-making details documented in ED Course.    Details: X-ray sacrum and coccyx show no fracture.  Risk Prescription drug management. Risk Details: Pharmacy reviewed patient's medications.  Patient's blood pressure medicine was filled on December 17.  Patient was given a 90-day supply from [facility] delivery pharmacy.  I discussed this with patient.  Patient states that the medicines could be at her house.  Patient is advised that she needs to take her medications as directed.  Patient states that she does live with someone who can assist her with her medications.           Final Clinical  Impression(s) / ED Diagnoses Final diagnoses:  Hypertension, unspecified type  Contusion of coccyx, initial encounter    Rx / DC Orders ED Discharge  Orders     None      An After Visit Summary was printed and given to the patient.    Elson Areas, PA-C 07/04/23 1424    Estelle June A, DO 07/11/23 [phone]

## 2023-07-04 NOTE — Discharge Instructions (Signed)
 Return if any problems.

## 2023-07-04 NOTE — ED Provider Triage Note (Signed)
Emergency Medicine Provider Triage Evaluation Note  Sarah Charles , a 63 y.o. female  was evaluated in triage.  Pt complains of high blood pressure and falling.  Pt complains of pain in her bottom. Pt out of blood pressure medications   Review of Systems  Positive: High blood pressure Negative: Chest pain  Physical Exam  BP (!) 189/78 (BP Location: Left Arm)   Pulse 73   Temp (!) 97.4 F (36.3 C) (Oral)   Resp 16   Ht 5\' 3"  (1.6 m)   Wt 82.1 kg   SpO2 97%   BMI 32.06 kg/m  Gen:   Awake, no distress   Resp:  Normal effort  MSK:   Moves extremities without difficulty  Other:    Medical Decision Making  Medically screening exam initiated at 10:45 AM.  Appropriate orders placed.  Sarah Charles was informed that the remainder of the evaluation will be completed by another provider, this initial triage assessment does not replace that evaluation, and the importance of remaining in the ED until their evaluation is complete.     Elson Areas, [place] 07/04/23 1047

## 2023-07-04 NOTE — ED Triage Notes (Signed)
 Pt. Stated, I'm here cause my BP is running high. I also fell 3 days ago and my tailbone hurts bad.

## 2023-07-04 NOTE — ED Notes (Signed)
Pt verbalized understanding of discharge instructions. Pt ambulatory  at time of discharge. Pt wheeled from ed family to drive home

## 2023-07-05 ENCOUNTER — Ambulatory Visit: Payer: 59

## 2023-07-05 ENCOUNTER — Telehealth: Payer: Self-pay | Admitting: *Deleted

## 2023-07-05 NOTE — Telephone Encounter (Signed)
Ms. Pasch was called this morning to be reminded  that she is up to date with her PNA vaccines so that her 11:00 appointment was not needed.   Asked patient about ED visit yesterday 07/04/2023 and  medication. She stated that her prescriptions had not been delivered. This nurse called [facility] to validate that patient medication was sent to her home(validated address and which medication were sent).   Per Fleet Contras with [facility], the Lisinopril and Carvedilol were unable to be filled until March because they were delivered 05/11/2023. Informed patient a delivery of other medications were sent out on 07/01/2023.   Patient states she didn't get the prescriptions. [facility] was called again and the call was transferred so she was able to speak with a representative in that regard.

## 2023-07-06 ENCOUNTER — Ambulatory Visit: Payer: 59 | Admitting: Physical Medicine and Rehabilitation

## 2023-07-11 ENCOUNTER — Other Ambulatory Visit: Payer: Self-pay | Admitting: Family Medicine

## 2023-07-11 DIAGNOSIS — R110 Nausea: Secondary | ICD-10-CM

## 2023-07-11 DIAGNOSIS — I10 Essential (primary) hypertension: Secondary | ICD-10-CM

## 2023-07-13 ENCOUNTER — Telehealth: Payer: Self-pay | Admitting: Family Medicine

## 2023-07-13 ENCOUNTER — Ambulatory Visit: Payer: Self-pay | Admitting: Licensed Clinical Social Worker

## 2023-07-13 NOTE — Telephone Encounter (Signed)
Copied from CRM [id number]. Topic: Clinical - Medication Refill >> Jul 13, 2023 12:01 PM Fuller Mandril wrote: Most Recent Primary Care Visit:  Provider: Anders Simmonds  Department: [facility]  Visit Type: OFFICE VISIT  Date: 06/22/2023  Medication: Fish Oil-Cholecalciferol (FISH OIL + D3 PO)  Has the patient contacted their pharmacy? Yes (Agent: If no, request that the patient contact the pharmacy for the refill. If patient does not wish to contact the pharmacy document the reason why and proceed with request.) (Agent: If yes, when and what did the pharmacy advise?)  Is this the correct pharmacy for this prescription? Yes If no, delete pharmacy and type the correct one.  This is the patient's preferred pharmacy:  [facility] - [place], [place] - [address] [place] [address] Phone: [phone] Fax: [phone]   Has the prescription been filled recently? No  Is the patient out of the medication? Yes  Has the patient been seen for an appointment in the last year OR does the patient have an upcoming appointment? Yes  Can we respond through MyChart? No  Agent: Please be advised that Rx refills may take up to 3 business days. We ask that you follow-up with your pharmacy.

## 2023-07-13 NOTE — Telephone Encounter (Signed)
Informed patient that this is OTC.  She was reminded of next appointment.

## 2023-07-13 NOTE — Patient Outreach (Signed)
  Care Coordination   Follow Up Visit Note   07/13/2023 Name: Sarah Charles MRN: [id number] DOB: [birth year]  Sarah Charles is a 63 y.o. year old female who sees [name], MD for primary care. I spoke with  Sarah Charles by phone today.  What matters to the patients health and wellness today?  Housing resources     Goals Addressed             This Visit's Progress    Care Coordination Activities   On track    Care Coordination Interventions: Patient stated that she is living with a friend and wants her own place, she receives $945 in SSI, $49 in Food stamps and from her insurance a card with about $325 to cover medical and prescriptions and utility bills. The SW will mail some housing resources The SW completed the SDOH questions and no other needs. Sw will follow up on 08/01/2023 at 11:00 am        SDOH assessments and interventions completed:  Yes  SDOH Interventions Today    Flowsheet Row Most Recent Value  SDOH Interventions   Food Insecurity Interventions Intervention Not Indicated  Housing Interventions Intervention Not Indicated  Transportation Interventions Intervention Not Indicated  Utilities Interventions Intervention Not Indicated        Care Coordination Interventions:  Yes, provided  Interventions Today    Flowsheet Row Most Recent Value  General Interventions   General Interventions Discussed/Reviewed General Interventions Reviewed, [employer] stated that she is not home and will not be back for a while she is staying with friends while her house is being worked on and wants SW to check back in a couple of weeks]        Follow up plan: Follow up call scheduled for 08/01/2023 at 11:00am    Encounter Outcome:  Patient Visit Completed   Jeanie Cooks, PhD [facility], [facility] Social Worker Direct Dial: [phone]  Fax: [phone]

## 2023-07-13 NOTE — Patient Instructions (Signed)
Visit Information  Thank you for taking time to visit with me today. Please don't hesitate to contact me if I can be of assistance to you.   Following are the goals we discussed today:   Goals Addressed             This Visit's Progress    Care Coordination Activities   On track    Care Coordination Interventions: Patient stated that she is living with a friend and wants her own place, she receives $945 in SSI, $86 in Food stamps and from her insurance a card with about $325 to cover medical and prescriptions and utility bills. The SW will mail some housing resources The SW completed the SDOH questions and no other needs. Sw will follow up on 08/01/2023 at 11:00 am        Our next appointment is by telephone on 08/01/2023 at 11:00 am  Please call the care guide team at [phone] if you need to cancel or reschedule your appointment.   If you are experiencing a Mental Health or Behavioral Health Crisis or need someone to talk to, please call the Suicide and Crisis Lifeline: 988 go to [facility] [address], [place] [phone]) call 911  The patient verbalized understanding of instructions, educational materials, and care plan provided today and DECLINED offer to receive copy of patient instructions, educational materials, and care plan.   Jeanie Cooks, PhD [facility], [facility] Social Worker Direct Dial: [phone]  Fax: [phone]

## 2023-07-15 ENCOUNTER — Other Ambulatory Visit: Payer: Self-pay | Admitting: Physician Assistant

## 2023-07-15 ENCOUNTER — Ambulatory Visit: Payer: Self-pay

## 2023-07-15 ENCOUNTER — Other Ambulatory Visit: Payer: Self-pay

## 2023-07-15 DIAGNOSIS — R600 Localized edema: Secondary | ICD-10-CM

## 2023-07-15 MED ORDER — FUROSEMIDE 20 MG PO TABS
20.0000 mg | ORAL_TABLET | Freq: Every day | ORAL | 2 refills | Status: DC
  Filled 2023-07-15: qty 30, 30d supply, fill #0

## 2023-07-15 NOTE — Patient Outreach (Signed)
Care Coordination   Follow Up Visit Note   07/15/2023 Name: Margerie Charles MRN: [id number] DOB: 02/21/1961  [name] is a 63 y.o. year old female who sees [name], MD for primary care. I spoke with  Sarah Charles by phone today.  What matters to the patients health and wellness today?  Patient would like to refill her BP medications today to help lower her blood pressure.     Goals Addressed             This Visit's Progress    To obtain a home BP cuff   Not on track    Care Coordination Interventions: Evaluation of current treatment plan related to hypertension self management and patient's adherence to plan as established by provider Determined patient experienced a recent ED visit secondary to Hypertension, she states she ran out of her medication and did not contact her doctor request a refill  Reviewed patient's current BP medication regimen, discussed patient does not have any of her BP medications on hand, she states her pharmacy did not deliver these medications Placed outbound joint call with patient to [facility], [facility], spoke with pharmacist Erskine Squibb who advised she will contact [facility] Mail delivery to transfer patient's medications to [facility]  Confirmed patient's mobile number is no file to receive text notifications once her BP medications are ready for pick up Instructed patient to take her BP meds today once picked up from the pharmacy Reviewed and discussed with patient her upcoming scheduled appointments, patient recorded date/time/location  Counseled patient on the importance of keeping all scheduled appointments  Mailed written instructions to patient on How to Accurately Measure BP at home, patient will use her deceased mother's BP cuff  Discussed plans with patient for ongoing care coordination follow up and provided patient with direct contact information for nurse care coordinator Patient will refill her  BP meds today once the pharmacy notifies her Patient will take her BP meds exactly as prescribed without missed doses  Patient will monitor her BP at home and report abnormal parameters to her PCP Patient will keep all scheduled medical appointments as directed  Patient will continue to work with nurse care coordinator with next scheduled call set for 07/21/23 @10 :00 AM Last practice recorded BP readings:  BP Readings from Last 3 Encounters:  07/04/23 (!) 178/79  06/22/23 (!) 147/86  05/16/23 (!) 154/82   Most recent eGFR/CrCl:  Lab Results  Component Value Date   EGFR 97 02/25/2022    No components found for: "CRCL"    Interventions Today    Flowsheet Row Most Recent Value  Chronic Disease   Chronic disease during today's visit Hypertension (HTN)  General Interventions   General Interventions Discussed/Reviewed General Interventions Discussed, General Interventions Reviewed, Communication with, Doctor Visits, Durable Medical Equipment (DME)  Doctor Visits Discussed/Reviewed Doctor Visits Reviewed, Doctor Visits Discussed, PCP, Specialist  Durable Medical Equipment (DME) BP Cuff  Communication with Pharmacists  [facility]]  Education Interventions   Education Provided Provided Education  Provided Verbal Education On Medication, When to see the doctor  Pharmacy Interventions   Pharmacy Dicussed/Reviewed Pharmacy Topics Discussed, Pharmacy Topics Reviewed, Medication Adherence, Medications and their functions  Medication Adherence Not taking medication, Unable to refill medication          SDOH assessments and interventions completed:  No     Care Coordination Interventions:  Yes, provided   Follow up plan: Follow up call  scheduled for 07/21/23 @10 :00 AM    Encounter Outcome:  Patient Visit Completed

## 2023-07-15 NOTE — Patient Instructions (Signed)
Visit Information  Thank you for taking time to visit with me today. Please don't hesitate to contact me if I can be of assistance to you.   Following are the goals we discussed today:   Goals Addressed             This Visit's Progress    To obtain a home BP cuff   Not on track    Care Coordination Interventions: Evaluation of current treatment plan related to hypertension self management and patient's adherence to plan as established by provider Determined patient experienced a recent ED visit secondary to Hypertension, she states she ran out of her medication and did not contact her doctor request a refill  Reviewed patient's current BP medication regimen, discussed patient does not have any of her BP medications on hand, she states her pharmacy did not deliver these medications Placed outbound joint call with patient to [facility], [facility], spoke with pharmacist Erskine Squibb who advised she will contact [facility] Mail delivery to transfer patient's medications to [facility]  Confirmed patient's mobile number is no file to receive text notifications once her BP medications are ready for pick up Instructed patient to take her BP meds today once picked up from the pharmacy Reviewed and discussed with patient her upcoming scheduled appointments, patient recorded date/time/location  Counseled patient on the importance of keeping all scheduled appointments  Mailed written instructions to patient on How to Accurately Measure BP at home, patient will use her deceased mother's BP cuff  Discussed plans with patient for ongoing care coordination follow up and provided patient with direct contact information for nurse care coordinator Patient will refill her BP meds today once the pharmacy notifies her Patient will take her BP meds exactly as prescribed without missed doses  Patient will monitor her BP at home and report abnormal parameters to her PCP Patient will  keep all scheduled medical appointments as directed  Patient will continue to work with nurse care coordinator with next scheduled call set for 07/21/23 @10 :00 AM Last practice recorded BP readings:  BP Readings from Last 3 Encounters:  07/04/23 (!) 178/79  06/22/23 (!) 147/86  05/16/23 (!) 154/82   Most recent eGFR/CrCl:  Lab Results  Component Value Date   EGFR 97 02/25/2022    No components found for: "CRCL"           Our next appointment is by telephone on 07/21/23 at 10:00 AM  Please call the care guide team at [phone] if you need to cancel or reschedule your appointment.   If you are experiencing a Mental Health or Behavioral Health Crisis or need someone to talk to, please call 1-800-273-TALK (toll free, 24 hour hotline)  The patient verbalized understanding of instructions, educational materials, and care plan provided today and DECLINED offer to receive copy of patient instructions, educational materials, and care plan.   Delsa Sale RN BSN CCM [place]  [facility], [facility] Health Nurse Care Coordinator  Direct Dial: [phone] Website: [email or web address]

## 2023-07-18 ENCOUNTER — Other Ambulatory Visit: Payer: Self-pay | Admitting: Family Medicine

## 2023-07-18 DIAGNOSIS — R110 Nausea: Secondary | ICD-10-CM

## 2023-07-18 DIAGNOSIS — I10 Essential (primary) hypertension: Secondary | ICD-10-CM

## 2023-07-19 ENCOUNTER — Ambulatory Visit: Payer: Self-pay | Admitting: Family Medicine

## 2023-07-19 NOTE — Telephone Encounter (Signed)
  Chief Complaint: Ringworm Symptoms: rash, itching Frequency: noticed this morning Pertinent Negatives: Patient denies fever, streaking Disposition: [] ED /[] Urgent Care (no appt availability in office) / [] Appointment(In office/virtual)/ []  [place] [facility]/ [x] Home Care/ [] Refused Recommended Disposition /[] [place] [facility]/ []  Follow-up with PCP Additional Notes: Patient called reporting on area of ringworm on L leg at the hip that was noticed this morning. Per protocol, home care appropriate. Care advice reviewed, patient verbalized understanding and denies further questions at this time. Advised to monitor for worsening signs and symptoms and call back with any changes. Alerting PCP for review.    Reason for Disposition  Ringworm  Answer Assessment - Initial Assessment Questions 1. APPEARANCE of RASH: "Describe the rash."      Looks like ringworm 2. LOCATION: "Where is the rash located?"      Left leg at the hip 3. NUMBER: "How many spots are there?"      One spot 4. SIZE: "How big are the spots?" (Inches, centimeters or compare to size of a coin)      Dime size 5. ONSET: "When did the rash start?"      Noticed this morning 6. ITCHING: "Does the rash itch?" If Yes, ask: "How bad is the itch?"  (Scale 0-10; or none, mild, moderate, severe)     Mild 7. PAIN: "Does the rash hurt?" If Yes, ask: "How bad is the pain?"  (Scale 0-10; or none, mild, moderate, severe)    - NONE (0): no pain    - MILD (1-3): doesn't interfere with normal activities     - MODERATE (4-7): interferes with normal activities or awakens from sleep     - SEVERE (8-10): excruciating pain, unable to do any normal activities     Denies 8. OTHER SYMPTOMS: "Do you have any other symptoms?" (e.g., fever)     Denies 9. PREGNANCY: "Is there any chance you are pregnant?" "When was your last menstrual period?"     NA  Answer Assessment - Initial Assessment Questions 1. APPEARANCE of RASH: "What does the  rash look like?"      Ring worm 2. LOCATION: "Where is the rash located?"      L leg at the hip 3. SIZE: "How large are the spots?"      Dime size 4. NUMBER: "How many spots are there?"      One 5. ONSET: "When did the ringworm start?"     Noticed this morning 6. OTHER SYMPTOMS: "Do you have any other symptoms?" (e.g., fever, headache, etc.)     Denies 7. PREGNANCY: "Is there any chance you are pregnant?" "When was your last menstrual period?"     NA  Protocols used: Rash or Redness - Localized-A-AH, Ringworm-A-AH

## 2023-07-19 NOTE — Telephone Encounter (Signed)
 NOTED

## 2023-07-20 ENCOUNTER — Ambulatory Visit: Payer: Medicare HMO | Attending: Cardiology | Admitting: Cardiology

## 2023-07-21 ENCOUNTER — Telehealth: Payer: Self-pay

## 2023-07-21 ENCOUNTER — Other Ambulatory Visit: Payer: Self-pay | Admitting: Pharmacist

## 2023-07-21 ENCOUNTER — Ambulatory Visit: Payer: Self-pay

## 2023-07-21 ENCOUNTER — Other Ambulatory Visit: Payer: Self-pay

## 2023-07-21 ENCOUNTER — Encounter: Payer: Self-pay | Admitting: Cardiology

## 2023-07-21 DIAGNOSIS — E1169 Type 2 diabetes mellitus with other specified complication: Secondary | ICD-10-CM

## 2023-07-21 DIAGNOSIS — E1165 Type 2 diabetes mellitus with hyperglycemia: Secondary | ICD-10-CM

## 2023-07-21 DIAGNOSIS — I2510 Atherosclerotic heart disease of native coronary artery without angina pectoris: Secondary | ICD-10-CM

## 2023-07-21 DIAGNOSIS — I10 Essential (primary) hypertension: Secondary | ICD-10-CM

## 2023-07-21 DIAGNOSIS — I152 Hypertension secondary to endocrine disorders: Secondary | ICD-10-CM

## 2023-07-21 DIAGNOSIS — E1159 Type 2 diabetes mellitus with other circulatory complications: Secondary | ICD-10-CM

## 2023-07-21 DIAGNOSIS — R110 Nausea: Secondary | ICD-10-CM

## 2023-07-21 DIAGNOSIS — Z794 Long term (current) use of insulin: Secondary | ICD-10-CM

## 2023-07-21 DIAGNOSIS — E118 Type 2 diabetes mellitus with unspecified complications: Secondary | ICD-10-CM

## 2023-07-21 DIAGNOSIS — R600 Localized edema: Secondary | ICD-10-CM

## 2023-07-21 DIAGNOSIS — I739 Peripheral vascular disease, unspecified: Secondary | ICD-10-CM

## 2023-07-21 DIAGNOSIS — E1142 Type 2 diabetes mellitus with diabetic polyneuropathy: Secondary | ICD-10-CM

## 2023-07-21 DIAGNOSIS — F32A Depression, unspecified: Secondary | ICD-10-CM

## 2023-07-21 MED ORDER — FUROSEMIDE 20 MG PO TABS
20.0000 mg | ORAL_TABLET | Freq: Every day | ORAL | 1 refills | Status: DC
  Filled 2023-07-21 – 2023-08-01 (×2): qty 90, 90d supply, fill #0
  Filled 2023-10-24 (×2): qty 90, 90d supply, fill #1

## 2023-07-21 MED ORDER — VITAMIN B-12 500 MCG PO TABS
500.0000 ug | ORAL_TABLET | Freq: Every day | ORAL | 1 refills | Status: DC
  Filled 2023-07-21 – 2023-08-25 (×2): qty 100, 100d supply, fill #0

## 2023-07-21 MED ORDER — PERMETHRIN 5 % EX CREA
TOPICAL_CREAM | CUTANEOUS | 0 refills | Status: DC
Start: 2023-07-21 — End: 2023-09-15
  Filled 2023-07-21: qty 60, 1d supply, fill #0

## 2023-07-21 MED ORDER — GABAPENTIN 300 MG PO CAPS
600.0000 mg | ORAL_CAPSULE | Freq: Two times a day (BID) | ORAL | 2 refills | Status: DC
  Filled 2023-07-21 – 2023-08-25 (×2): qty 360, 90d supply, fill #0

## 2023-07-21 MED ORDER — METFORMIN HCL 500 MG PO TABS
1000.0000 mg | ORAL_TABLET | Freq: Two times a day (BID) | ORAL | 1 refills | Status: DC
  Filled 2023-07-21 – 2023-08-01 (×2): qty 360, 90d supply, fill #0
  Filled 2023-10-24 (×2): qty 360, 90d supply, fill #1

## 2023-07-21 MED ORDER — FLUOXETINE HCL 20 MG PO CAPS
20.0000 mg | ORAL_CAPSULE | Freq: Every morning | ORAL | 1 refills | Status: DC
  Filled 2023-07-21 – 2023-08-25 (×2): qty 90, 90d supply, fill #0
  Filled 2023-11-17: qty 90, 90d supply, fill #1

## 2023-07-21 MED ORDER — PANTOPRAZOLE SODIUM 40 MG PO TBEC
40.0000 mg | DELAYED_RELEASE_TABLET | Freq: Two times a day (BID) | ORAL | 1 refills | Status: DC
Start: 2023-07-21 — End: 2024-01-13
  Filled 2023-07-21 – 2023-08-25 (×2): qty 180, 90d supply, fill #0
  Filled 2023-11-17: qty 180, 90d supply, fill #1

## 2023-07-21 MED ORDER — CARVEDILOL 3.125 MG PO TABS
3.1250 mg | ORAL_TABLET | Freq: Two times a day (BID) | ORAL | 1 refills | Status: DC
  Filled 2023-07-21 – 2023-07-25 (×2): qty 180, 90d supply, fill #0
  Filled 2023-10-24 (×2): qty 180, 90d supply, fill #1

## 2023-07-21 MED ORDER — BUPROPION HCL ER (SR) 150 MG PO TB12
150.0000 mg | ORAL_TABLET | Freq: Two times a day (BID) | ORAL | 1 refills | Status: DC
  Filled 2023-07-21 – 2023-08-01 (×2): qty 180, 90d supply, fill #0
  Filled 2023-10-24 (×2): qty 180, 90d supply, fill #1

## 2023-07-21 MED ORDER — TRAZODONE HCL 50 MG PO TABS
50.0000 mg | ORAL_TABLET | Freq: Every day | ORAL | 1 refills | Status: DC
  Filled 2023-07-21 – 2023-08-25 (×2): qty 90, 90d supply, fill #0
  Filled 2023-11-17: qty 90, 90d supply, fill #1

## 2023-07-21 MED ORDER — ROSUVASTATIN CALCIUM 40 MG PO TABS
40.0000 mg | ORAL_TABLET | Freq: Every evening | ORAL | 1 refills | Status: DC
  Filled 2023-07-21 – 2023-08-25 (×2): qty 90, 90d supply, fill #0
  Filled 2023-11-17: qty 90, 90d supply, fill #1

## 2023-07-21 MED ORDER — TRIAMCINOLONE ACETONIDE 0.1 % EX CREA
1.0000 | TOPICAL_CREAM | Freq: Two times a day (BID) | CUTANEOUS | 1 refills | Status: DC
  Filled 2023-07-21: qty 45, 23d supply, fill #0

## 2023-07-21 MED ORDER — METOCLOPRAMIDE HCL 10 MG PO TABS
10.0000 mg | ORAL_TABLET | Freq: Three times a day (TID) | ORAL | 0 refills | Status: DC | PRN
Start: 2023-07-21 — End: 2024-01-13
  Filled 2023-07-21 – 2023-08-01 (×2): qty 40, 14d supply, fill #0

## 2023-07-21 MED ORDER — FENOFIBRATE 145 MG PO TABS
145.0000 mg | ORAL_TABLET | Freq: Every evening | ORAL | 1 refills | Status: DC
  Filled 2023-07-21 – 2023-08-01 (×2): qty 90, 90d supply, fill #0
  Filled 2023-10-24 (×2): qty 90, 90d supply, fill #1

## 2023-07-21 MED ORDER — LISINOPRIL 40 MG PO TABS
40.0000 mg | ORAL_TABLET | Freq: Every day | ORAL | 1 refills | Status: DC
  Filled 2023-07-21 – 2023-07-25 (×2): qty 90, 90d supply, fill #0
  Filled 2023-10-24 (×2): qty 90, 90d supply, fill #1

## 2023-07-21 MED ORDER — LANTUS SOLOSTAR 100 UNIT/ML ~~LOC~~ SOPN
33.0000 [IU] | PEN_INJECTOR | Freq: Two times a day (BID) | SUBCUTANEOUS | 6 refills | Status: DC
  Filled 2023-07-21 – 2023-08-25 (×2): qty 45, 68d supply, fill #0
  Filled 2023-10-24 (×2): qty 45, 68d supply, fill #1

## 2023-07-21 MED ORDER — COMFORT EZ PEN NEEDLES 31G X 5 MM MISC
1.0000 | Freq: Two times a day (BID) | 3 refills | Status: DC
  Filled 2023-07-21: qty 100, 50d supply, fill #0

## 2023-07-21 NOTE — Telephone Encounter (Signed)
 Called patient to discuss the LCS program. She is already scheduled for her annual CT on 07/28/2023 with primary. Advises she will complete scans with PCP.

## 2023-07-21 NOTE — Patient Outreach (Signed)
 Care Coordination   Follow Up Visit Note   07/21/2023 Name: Sarah Charles MRN: [id number] DOB: 05-28-1960  Mylin Gignac is a 63 y.o. year old female who sees [name], MD for primary care. I spoke with  [name] by phone today.  What matters to the patients health and wellness today?  Patient would like help transferring her medications to a pharmacy who can deliver to her home.    Goals Addressed             This Visit's Progress    To improve medication adherence   On track    Care Coordination Interventions: Evaluation of current treatment plan related to hypertension self management and patient's adherence to plan as established by provider Confirmed patient's blood pressure medications were transferred to [facility] at [facility]  Provided education to patient re: stroke prevention, s/s of heart attack and stroke Reviewed medications with patient and discussed importance of compliance; Determined patient did not pick up her meds from the pharmacy, she stated she could not find a ride Advised patient, providing education and rationale, to monitor blood pressure daily and record, calling PCP for findings outside established parameters, patient stated her cuff does not have batteries Discussed complications of poorly controlled blood pressure such as heart disease, stroke, circulatory complications, vision complications, kidney impairment, sexual dysfunction Provided education on prescribed diet low Sodium  Sent pharmacy referral to [facility] Shore Ausdall RPH-CPP requesting outreach to patient to assist with transferring medications to [facility] at [facility] Discussed plans with patient for ongoing care coordination follow up and provided patient with direct contact information for nurse care coordinator Routed note to PCP Last practice recorded BP readings:  BP Readings from Last 3 Encounters:  07/04/23 (!) 178/79  06/22/23 (!)  147/86  05/16/23 (!) 154/82   Most recent eGFR/CrCl:  Lab Results  Component Value Date   EGFR 97 02/25/2022    No components found for: "CRCL"     Interventions Today    Flowsheet Row Most Recent Value  Chronic Disease   Chronic disease during today's visit Hypertension (HTN), Diabetes  General Interventions   General Interventions Discussed/Reviewed General Interventions Discussed, General Interventions Reviewed, Doctor Visits, Durable Medical Equipment (DME), Communication with  Doctor Visits Discussed/Reviewed Doctor Visits Discussed, Doctor Visits Reviewed, PCP  Durable Medical Equipment (DME) BP Cuff, Glucomoter  Communication with Social Work, PCP/Specialists  [[employer] RPH-CPP,  Dr. Alvis Lemmings  Education Interventions   Education Provided Provided Education  Provided Verbal Education On Medication, When to see the doctor  Pharmacy Interventions   Pharmacy Dicussed/Reviewed Pharmacy Topics Reviewed, Pharmacy Topics Discussed, Medications and their functions, Medication Adherence, Affording Medications, Referral to Pharmacist  Medication Adherence Not taking medication  Referral to Pharmacist --  [needs a pharmacy who can deliver her medications]         SDOH assessments and interventions completed:  No     Care Coordination Interventions:  Yes, provided   Follow up plan: Referral made to [facility] Ausdall RPH-CPP Follow up call scheduled for 07/28/23 @10 :00 AM    Encounter Outcome:  Patient Visit Completed

## 2023-07-21 NOTE — Patient Instructions (Signed)
 Visit Information  Thank you for taking time to visit with me today. Please don't hesitate to contact me if I can be of assistance to you.   Following are the goals we discussed today:   Goals Addressed             This Visit's Progress    To improve medication adherence   On track    Care Coordination Interventions: Evaluation of current treatment plan related to hypertension self management and patient's adherence to plan as established by provider Confirmed patient's blood pressure medications were transferred to [facility] at [facility]  Provided education to patient re: stroke prevention, s/s of heart attack and stroke Reviewed medications with patient and discussed importance of compliance; Determined patient did not pick up her meds from the pharmacy, she stated she could not find a ride Advised patient, providing education and rationale, to monitor blood pressure daily and record, calling PCP for findings outside established parameters, patient stated her cuff does not have batteries Discussed complications of poorly controlled blood pressure such as heart disease, stroke, circulatory complications, vision complications, kidney impairment, sexual dysfunction Provided education on prescribed diet low Sodium  Sent pharmacy referral to [facility] Shore Ausdall RPH-CPP requesting outreach to patient to assist with transferring medications to [facility] at [facility] Discussed plans with patient for ongoing care coordination follow up and provided patient with direct contact information for nurse care coordinator Routed note to PCP Last practice recorded BP readings:  BP Readings from Last 3 Encounters:  07/04/23 (!) 178/79  06/22/23 (!) 147/86  05/16/23 (!) 154/82   Most recent eGFR/CrCl:  Lab Results  Component Value Date   EGFR 97 02/25/2022    No components found for: "CRCL"         Our next appointment is by telephone on 07/28/23 at 10:00  AM  Please call the care guide team at [phone] if you need to cancel or reschedule your appointment.   If you are experiencing a Mental Health or Behavioral Health Crisis or need someone to talk to, please call 1-800-273-TALK (toll free, 24 hour hotline)  The patient verbalized understanding of instructions, educational materials, and care plan provided today and DECLINED offer to receive copy of patient instructions, educational materials, and care plan.   Delsa Sale RN BSN CCM [place]  [facility], [facility] Health Nurse Care Coordinator  Direct Dial: [phone] Website: [email or web address]

## 2023-07-22 ENCOUNTER — Other Ambulatory Visit: Payer: Self-pay

## 2023-07-22 ENCOUNTER — Other Ambulatory Visit (HOSPITAL_COMMUNITY): Payer: Self-pay

## 2023-07-22 ENCOUNTER — Telehealth: Payer: Self-pay

## 2023-07-22 ENCOUNTER — Ambulatory Visit: Payer: 59

## 2023-07-22 NOTE — Telephone Encounter (Signed)
 Patient was called and a VM was left with contact information for the eye doctor that she was referred to.    Copied from CRM [id number]. Topic: General - Other >> Jul 22, 2023  8:46 AM Sarah Charles wrote: Reason for CRM: Patient was referred to an eye doctor and she states that she has been trying to call them to set up an appointment and all she gets is a busy signal and she is needing to speak to someone to either get in contact with the doctor they sent her to or another doctor.

## 2023-07-25 ENCOUNTER — Other Ambulatory Visit (HOSPITAL_COMMUNITY): Payer: Self-pay

## 2023-07-25 ENCOUNTER — Other Ambulatory Visit: Payer: Self-pay

## 2023-07-25 ENCOUNTER — Ambulatory Visit: Payer: Medicare HMO

## 2023-07-25 NOTE — Patient Outreach (Signed)
 Care Coordination   Follow Up Visit Note   07/25/2023 Name: Sarah Charles MRN: [id number] DOB: 02-03-1961  Sarah Charles is a 63 y.o. year old female who sees Sarah Register, MD for primary care. I spoke with  Sarah Charles by phone today.  What matters to the patients health and wellness today?  Patient would like to have her medications delivered to her home so she can restart her BP meds. Patient would like to schedule her eye appointment.     Goals Addressed             This Visit's Progress    To improve medication adherence   On track    Care Coordination Interventions: Evaluation of current treatment plan related to hypertension self management and patient's adherence to plan as established by provider Collaboration with Sarah Charles RPH-CPP regarding completion of transfer of patient's complete medication profile sent to [facility], with request to have patient's medications delivered to her home  Placed outbound call to [facility] regarding refill update, spoke with Amy who will escalate the delivery of patient's Carvedilol and Lisinopril, to be mailed out today per [employer], patient should receive these medications tomorrow, the remainder will be filled and mailed on 08/01/23 per okay by insurance Spoke with patient to advise of the above stated, urged patient to resume taking her BP medications as soon as she receives them and she verbalizes that she will do so, confirmed delivery address with patient  Scheduled a nurse follow up call with patient for 08/05/23 @11 :00 AM Last practice recorded BP readings:  BP Readings from Last 3 Encounters:  07/04/23 (!) 178/79  06/22/23 (!) 147/86  05/16/23 (!) 154/82   Most recent eGFR/CrCl:  Lab Results  Component Value Date   EGFR 97 02/25/2022    No components found for: "CRCL"    Interventions Today    Flowsheet Row Most Recent Value  Chronic Disease   Chronic disease during  today's visit Hypertension (HTN)  General Interventions   General Interventions Discussed/Reviewed General Interventions Discussed, General Interventions Reviewed, Doctor Visits, Communication with  Doctor Visits Discussed/Reviewed Doctor Visits Discussed, Doctor Visits Reviewed, PCP  Communication with Pharmacists  [facility]]  Education Interventions   Education Provided Provided Education  Provided Verbal Education On When to see the doctor, Medication  Pharmacy Interventions   Pharmacy Dicussed/Reviewed Pharmacy Topics Discussed, Pharmacy Topics Reviewed, Medication Adherence  Medication Adherence Not taking medication  [unable to pick up meds from pharmacy]          SDOH assessments and interventions completed:  No     Care Coordination Interventions:  Yes, provided   Follow up plan: Follow up call scheduled for 08/05/23 @11 :00 AM    Encounter Outcome:  Patient Visit Completed

## 2023-07-25 NOTE — Patient Instructions (Signed)
 Visit Information  Thank you for taking time to visit with me today. Please don't hesitate to contact me if I can be of assistance to you.   Following are the goals we discussed today:   Goals Addressed             This Visit's Progress    To improve medication adherence   On track    Care Coordination Interventions: Evaluation of current treatment plan related to hypertension self management and patient's adherence to plan as established by provider Collaboration with [name] RPH-CPP regarding completion of transfer of patient's complete medication profile sent to [facility], with request to have patient's medications delivered to her home  Placed outbound call to [facility] regarding refill update, spoke with Amy who will escalate the delivery of patient's Carvedilol and Lisinopril, to be mailed out today per [employer], patient should receive these medications tomorrow, the remainder will be filled and mailed on 08/01/23 per okay by insurance Spoke with patient to advise of the above stated, urged patient to resume taking her BP medications as soon as she receives them and she verbalizes that she will do so, confirmed delivery address with patient  Scheduled a nurse follow up call with patient for 08/05/23 @11 :00 AM Last practice recorded BP readings:  BP Readings from Last 3 Encounters:  07/04/23 (!) 178/79  06/22/23 (!) 147/86  05/16/23 (!) 154/82   Most recent eGFR/CrCl:  Lab Results  Component Value Date   EGFR 97 02/25/2022    No components found for: "CRCL"        Our next appointment is by telephone on 08/05/23 at 11:00 AM  Please call the care guide team at [phone] if you need to cancel or reschedule your appointment.   If you are experiencing a Mental Health or Behavioral Health Crisis or need someone to talk to, please call 1-800-273-TALK (toll free, 24 hour hotline)  The patient verbalized understanding of  instructions, educational materials, and care plan provided today and DECLINED offer to receive copy of patient instructions, educational materials, and care plan.   Delsa Sale RN BSN CCM [place]  [facility], [facility] Health Nurse Care Coordinator  Direct Dial: [phone] Website: [email or web address]

## 2023-07-26 ENCOUNTER — Other Ambulatory Visit: Payer: Self-pay

## 2023-07-26 ENCOUNTER — Telehealth: Payer: Self-pay

## 2023-07-26 NOTE — Telephone Encounter (Signed)
 Called patient and she given [employer]  [facility]) [phone] [address], [address] [place], [place] [address] [phone] Fax [phone] as requested

## 2023-07-26 NOTE — Telephone Encounter (Signed)
 Copied from CRM [id number]. Topic: Referral - Question >> Jul 26, 2023  3:02 PM Maree Krabbe H wrote: Reason for CRM: Patient called and she is needing the information she was sent to for an eye doctor, patients callback number is [phone].

## 2023-07-28 ENCOUNTER — Other Ambulatory Visit: Payer: 59

## 2023-07-29 ENCOUNTER — Ambulatory Visit: Admitting: Podiatry

## 2023-07-29 ENCOUNTER — Ambulatory Visit
Admission: RE | Admit: 2023-07-29 | Discharge: 2023-07-29 | Disposition: A | Discharge status: Home / Self Care | Source: Ambulatory Visit | Attending: Family Medicine | Admitting: Family Medicine

## 2023-07-29 DIAGNOSIS — Z1231 Encounter for screening mammogram for malignant neoplasm of breast: Secondary | ICD-10-CM

## 2023-07-29 DIAGNOSIS — M79674 Pain in right toe(s): Secondary | ICD-10-CM

## 2023-07-29 DIAGNOSIS — E1142 Type 2 diabetes mellitus with diabetic polyneuropathy: Secondary | ICD-10-CM

## 2023-07-29 DIAGNOSIS — M79675 Pain in left toe(s): Secondary | ICD-10-CM | POA: Diagnosis not present

## 2023-07-29 DIAGNOSIS — B351 Tinea unguium: Secondary | ICD-10-CM | POA: Diagnosis not present

## 2023-07-29 NOTE — Progress Notes (Signed)
  Subjective:  Patient ID: Sarah Charles, female    DOB: [birth year],  MRN: [id number]  Chief Complaint  Patient presents with   Nail Problem   63 y.o. female returns for the above complaint.  Patient presents with thickened elongated dystrophic toenails x10.  Patient states that she is a diabetic that has been going on for 2 to 3 years.   There is no swelling or coldness associated.  They are painful to touch.  She would like to have them debrided down as she is not able to debride down herself.  She denies any other acute complaints.  She has not seen anyone else prior to seeing me.  Objective:  There were no vitals filed for this visit. Podiatric Exam: Vascular: dorsalis pedis and posterior tibial pulses are palpable bilateral. Capillary return is immediate. Temperature gradient is WNL. Skin turgor WNL  Sensorium: Normal Semmes Weinstein monofilament test. Normal tactile sensation bilaterally. Nail Exam: Pt has thick disfigured discolored nails with subungual debris noted bilateral entire nail hallux through fifth toenails.  Pain on palpation to the nails. Ulcer Exam: There is no evidence of ulcer or pre-ulcerative changes or infection. Orthopedic Exam: Muscle tone and strength are WNL. No limitations in general ROM. No crepitus or effusions noted. HAV  B/L.  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 Skin: No Porokeratosis. No infection or ulcers    Assessment & Plan:   No diagnosis found.    Patient was evaluated and treated and all questions answered.  Onychomycosis with pain  -Nails palliatively debrided as below. -Educated on self-care  Procedure: Nail Debridement Rationale: pain  Type of Debridement: manual, [place] debridement. Instrumentation: Nail nipper, rotary burr. Number of Nails: 10  Procedures and Treatment: Consent by patient was obtained for treatment procedures. The patient understood the discussion of treatment and procedures well. All questions were answered thoroughly  reviewed. Debridement of mycotic and hypertrophic toenails, 1 through 5 bilateral and clearing of subungual debris. No ulceration, no infection noted.  Return Visit-Office Procedure: Patient instructed to return to the office for a follow up visit 3 months for continued evaluation and treatment.  Nicholes Rough, DPM    No follow-ups on file.

## 2023-08-01 ENCOUNTER — Other Ambulatory Visit (HOSPITAL_COMMUNITY): Payer: Self-pay

## 2023-08-01 ENCOUNTER — Other Ambulatory Visit: Payer: Self-pay

## 2023-08-01 ENCOUNTER — Ambulatory Visit: Payer: Self-pay | Admitting: Licensed Clinical Social Worker

## 2023-08-01 NOTE — Patient Instructions (Signed)
 Visit Information  Thank you for taking time to visit with me today. Please don't hesitate to contact me if I can be of assistance to you.   Following are the goals we discussed today:   Goals Addressed             This Visit's Progress    Care Coordination Activities   On track    Care Coordination Interventions: Patient stated that she is living with a friend and wants her own place, she receives $945 in SSI, $76 in Food stamps and from her insurance a card with about $325 to cover medical and prescriptions and utility bills. The SW will mail some housing resources The SW completed the SDOH questions and no other needs. Sw will follow up on 08/16/2023 at 11:00 am        Our next appointment is by telephone on 08/16/2023 at 11:00 am  Please call the care guide team at [phone] if you need to cancel or reschedule your appointment.   If you are experiencing a Mental Health or Behavioral Health Crisis or need someone to talk to, please call the Suicide and Crisis Lifeline: 988 go to [facility] [address], [place] [phone]) call 911  The patient verbalized understanding of instructions, educational materials, and care plan provided today and DECLINED offer to receive copy of patient instructions, educational materials, and care plan.   Sarah Cooks, PhD [facility], [facility] Social Worker Direct Dial: [phone]  Fax: [phone]

## 2023-08-01 NOTE — Patient Outreach (Addendum)
 Care Coordination   Follow Up Visit Note   08/01/2023 Name: Sarah Charles MRN: [id number] DOB: 08-24-1960  Sarah Charles is a 63 y.o. year old female who sees [name], MD for primary care. I spoke with  [name] by phone today.  What matters to the patients health and wellness today?  Housing resources     Goals Addressed             This Visit's Progress    Care Coordination Activities   On track    Care Coordination Interventions: Patient stated that she is living with a friend and wants her own place, she receives $945 in SSI, $85 in Food stamps and from her insurance a card with about $325 to cover medical and prescriptions and utility bills. The SW will mail some housing resources The SW completed the SDOH questions and no other needs. Sw will follow up on 08/16/2023 at 11:00 am        SDOH assessments and interventions completed:  Yes  SDOH Interventions Today    Flowsheet Row Most Recent Value  SDOH Interventions   Food Insecurity Interventions Intervention Not Indicated  Housing Interventions Other (Comment)  [Patient still has not reviewed the housing resources mailed]  Transportation Interventions Intervention Not Indicated  Utilities Interventions Intervention Not Indicated        Care Coordination Interventions:  Yes, provided  Interventions Today    Flowsheet Row Most Recent Value  General Interventions   General Interventions Discussed/Reviewed General Interventions Reviewed, [employer] still has not reviewed resources mailed, wants SW to follow up again]        Follow up plan: Follow up call scheduled for 08/16/2023 at 11:00 am    Encounter Outcome:  Patient Visit Completed   Jeanie Cooks, PhD [facility], [facility] Social Worker Direct Dial: [phone]  Fax: [phone]

## 2023-08-03 ENCOUNTER — Other Ambulatory Visit (HOSPITAL_COMMUNITY): Payer: Self-pay

## 2023-08-05 ENCOUNTER — Ambulatory Visit: Payer: Self-pay

## 2023-08-05 NOTE — Patient Instructions (Signed)
 Visit Information  Thank you for taking time to visit with me today. Please don't hesitate to contact me if I can be of assistance to you.   Following are the goals we discussed today:   Goals Addressed             This Visit's Progress    To improve medication adherence   On track    Care Coordination Interventions: Completed successful outbound call with patient  Evaluation of current treatment plan related to hypertension self management and patient's adherence to plan as established by provider Determined patient received her medications from [facility] pharmacy via delivery and she reports being back on track with taking her BP meds Discussed patient has resumed taking her BP medications as directed Provided verbal and written instructions to patient about how to accurately measure BP at home, record readings and take BP at different times of the day  Educated patient re: medication adherence, taking her BP medications exactly as directed without missing doses  Educated patient re: importance of adhering to a low Sodium diet to help avoid fluid retention, mailed printed educational materials  Reviewed and discussed patient's upcoming scheduled appointment with PCP scheduled for 08/15/23 @2 :50 PM Scheduled nurse follow up call with patient for 08/17/23 @12 :15 PM Last practice recorded BP readings:  BP Readings from Last 3 Encounters:  07/04/23 (!) 178/79  06/22/23 (!) 147/86  05/16/23 (!) 154/82   Most recent eGFR/CrCl:  Lab Results  Component Value Date   EGFR 97 02/25/2022    No components found for: "CRCL"          Our next appointment is by telephone on 08/17/23 at 12:15 PM  Please call the care guide team at [phone] if you need to cancel or reschedule your appointment.   If you are experiencing a Mental Health or Behavioral Health Crisis or need someone to talk to, please call 1-800-273-TALK (toll free, 24 hour hotline)  The patient verbalized understanding  of instructions, educational materials, and care plan provided today and DECLINED offer to receive copy of patient instructions, educational materials, and care plan.   Delsa Sale RN BSN CCM [place]  [facility], [facility] Nurse Care Coordinator  Direct Dial: [phone] Website: [email or web address]

## 2023-08-05 NOTE — Patient Outreach (Signed)
 Care Coordination   Follow Up Visit Note   08/05/2023 Name: Azoria Abbett MRN: [id number] DOB: 1960/11/08  Alma Mohiuddin is a 63 y.o. year old female who sees [name], MD for primary care. I spoke with  [name] by phone today.  What matters to the patients health and wellness today?  Patient would like to start self monitoring her BP at home.     Goals Addressed             This Visit's Progress    To improve medication adherence   On track    Care Coordination Interventions: Completed successful outbound call with patient  Evaluation of current treatment plan related to hypertension self management and patient's adherence to plan as established by provider Determined patient received her medications from [facility] pharmacy via delivery and she reports being back on track with taking her BP meds Discussed patient has resumed taking her BP medications as directed Provided verbal and written instructions to patient about how to accurately measure BP at home, record readings and take BP at different times of the day  Educated patient re: medication adherence, taking her BP medications exactly as directed without missing doses  Educated patient re: importance of adhering to a low Sodium diet to help avoid fluid retention, mailed printed educational materials  Reviewed and discussed patient's upcoming scheduled appointment with PCP scheduled for 08/15/23 @2 :50 PM Scheduled nurse follow up call with patient for 08/17/23 @12 :15 PM Last practice recorded BP readings:  BP Readings from Last 3 Encounters:  07/04/23 (!) 178/79  06/22/23 (!) 147/86  05/16/23 (!) 154/82   Most recent eGFR/CrCl:  Lab Results  Component Value Date   EGFR 97 02/25/2022    No components found for: "CRCL"   Interventions Today    Flowsheet Row Most Recent Value  Chronic Disease   Chronic disease during today's visit Hypertension (HTN)  General Interventions   General Interventions  Discussed/Reviewed General Interventions Discussed, General Interventions Reviewed, Doctor Visits, Durable Medical Equipment (DME)  Doctor Visits Discussed/Reviewed Doctor Visits Discussed, Doctor Visits Reviewed, PCP  Durable Medical Equipment (DME) BP Cuff  Education Interventions   Education Provided Provided Education, Provided Printed Education  Provided Verbal Education On When to see the doctor, Medication, Nutrition  Nutrition Interventions   Nutrition Discussed/Reviewed Nutrition Discussed, Nutrition Reviewed, Decreasing salt  Pharmacy Interventions   Pharmacy Dicussed/Reviewed Pharmacy Topics Discussed, Pharmacy Topics Reviewed, Medications and their functions, Medication Adherence          SDOH assessments and interventions completed:  No     Care Coordination Interventions:  Yes, provided   Follow up plan: Follow up call scheduled for 08/17/23 @12 :15 PM     Encounter Outcome:  Patient Visit Completed

## 2023-08-09 ENCOUNTER — Telehealth: Payer: Self-pay

## 2023-08-09 NOTE — Telephone Encounter (Signed)
 Copied from CRM [id number]. Topic: General - Other >> Aug 09, 2023  9:58 AM Shon Hale wrote: Reason for CRM: Requesting call from nurse, states someone is needing chart notes about patient. Patient states they have been calling her for a while now.   Requesting callback, [phone]

## 2023-08-09 NOTE — Telephone Encounter (Signed)
 Call placed to patient unable to reach message left on VM. To advise that a release of information form needs to be signed by her before we can release information to any one else

## 2023-08-11 ENCOUNTER — Ambulatory Visit
Admission: RE | Admit: 2023-08-11 | Discharge: 2023-08-11 | Disposition: A | Discharge status: Home / Self Care | Source: Ambulatory Visit | Attending: Physician Assistant | Admitting: Physician Assistant

## 2023-08-11 DIAGNOSIS — Z122 Encounter for screening for malignant neoplasm of respiratory organs: Secondary | ICD-10-CM | POA: Diagnosis not present

## 2023-08-11 DIAGNOSIS — F17210 Nicotine dependence, cigarettes, uncomplicated: Secondary | ICD-10-CM | POA: Diagnosis not present

## 2023-08-12 ENCOUNTER — Ambulatory Visit: Admitting: Podiatry

## 2023-08-15 ENCOUNTER — Ambulatory Visit: Payer: Medicare HMO | Admitting: Family Medicine

## 2023-08-16 ENCOUNTER — Ambulatory Visit: Payer: Self-pay | Admitting: Licensed Clinical Social Worker

## 2023-08-16 NOTE — Patient Instructions (Signed)
 Visit Information  Thank you for taking time to visit with me today. Please don't hesitate to contact me if I can be of assistance to you.   Following are the goals we discussed today:   Goals Addressed             This Visit's Progress    COMPLETED: Care Coordination Activities       Care Coordination Interventions: Patient stated that she is living with a friend and wants her own place, she receives $945 in SSI, $81 in Food stamps and from her insurance a card with about $325 to cover medical and prescriptions and utility bills. The SW will mail some housing resources The SW completed the SDOH questions and no other needs. Sw will follow up on 08/16/2023 at 11:00 am        No further follow up, SW encouraged the patient to contact the PCP if any SDOH needs arise to get on the SW scheduled.   Please call the care guide team at [phone] if you need to cancel or reschedule your appointment.   If you are experiencing a Mental Health or Behavioral Health Crisis or need someone to talk to, please call the Suicide and Crisis Lifeline: 988 go to [facility] [address], [place] [phone]) call 911  The patient verbalized understanding of instructions, educational materials, and care plan provided today and DECLINED offer to receive copy of patient instructions, educational materials, and care plan.   Jeanie Cooks, PhD [facility], [facility] Social Worker Direct Dial: [phone]  Fax: [phone]

## 2023-08-16 NOTE — Patient Outreach (Signed)
 Care Coordination   Follow Up Visit Note   08/16/2023 Name: Sarah Charles MRN: [id number] DOB: 1960/06/28  Sarah Charles is a 63 y.o. year old female who sees [name], MD for primary care. I spoke with  [name] by phone today.  What matters to the patients health and wellness today?  Housing     Goals Addressed             This Visit's Progress    COMPLETED: Care Coordination Activities       Care Coordination Interventions: Patient stated that she is living with a friend and wants her own place, she receives $945 in SSI, $16 in Food stamps and from her insurance a card with about $325 to cover medical and prescriptions and utility bills. The SW will mail some housing resources The SW completed the SDOH questions and no other needs. Sw will follow up on 08/16/2023 at 11:00 am        SDOH assessments and interventions completed:  Yes  SDOH Interventions Today    Flowsheet Row Most Recent Value  SDOH Interventions   Food Insecurity Interventions Intervention Not Indicated  Housing Interventions Intervention Not Indicated  Transportation Interventions Intervention Not Indicated  Utilities Interventions Intervention Not Indicated        Care Coordination Interventions:  Yes, provided  Interventions Today    Flowsheet Row Most Recent Value  General Interventions   General Interventions Discussed/Reviewed General Interventions Reviewed, [employer] stated she has not checked her mail, SW will close out and if patient needs the information again the patient will call the SW]        Follow up plan: No further intervention required.   Encounter Outcome:  Patient Visit Completed  Jeanie Cooks, PhD [facility], [facility] Social Worker Direct Dial: [phone]  Fax: [phone]

## 2023-08-17 ENCOUNTER — Telehealth: Payer: Self-pay | Admitting: Family Medicine

## 2023-08-17 ENCOUNTER — Ambulatory Visit: Payer: Self-pay

## 2023-08-17 NOTE — Telephone Encounter (Signed)
 Call placed to Imaging reading room and Cherly informed me that this exam can not be moved to a STAT reading, she also states that reading will not take place for about 10 days due to them currently reviewing scans from 07/22/2023.

## 2023-08-17 NOTE — Patient Outreach (Signed)
 Care Coordination   Follow Up Visit Note   08/17/2023 Name: Sarah Charles MRN: [id number] DOB: August 09, 1960  Sarah Charles is a 63 y.o. year old female who sees [name], MD for primary care. I spoke with  [name] by phone today.  What matters to the patients health and wellness today?  Patient would like to keep her BP under control.    Goals Addressed             This Visit's Progress    To improve medication adherence   On track    Care Coordination Interventions: Completed successful outbound call with patient  Evaluation of current treatment plan related to hypertension self management and patient's adherence to plan as established by provider Care Coordination Interventions: Reviewed medications with patient and discussed importance of compliance, patient reports ongoing adherence with BP medications since having [facility] deliver her medications Advised patient, providing education and rationale, to monitor blood pressure daily and record, calling PCP for findings outside established parameters, patient reports BP readings at home are running in the 130's/80's Assessed social determinant of health barriers Reviewed and discussed with patient her recently missed PCP office visit, patient states her tire was flat right prior to leaving for her appointment Reviewed and discussed with patient her next scheduled PCP follow up with Dr. Alvis Lemmings is scheduled for 09/28/23 @09 :10 AM Informed patient of nurse case manager transfer to Juanell Fairly RN Scheduled nurse follow up call with patient for 09/30/23 @1 :30 PM Last practice recorded BP readings:  BP Readings from Last 3 Encounters:  07/04/23 (!) 178/79  06/22/23 (!) 147/86  05/16/23 (!) 154/82   Most recent eGFR/CrCl:  Lab Results  Component Value Date   EGFR 97 02/25/2022    No components found for: "CRCL"     Interventions Today    Flowsheet Row Most Recent Value  Chronic Disease   Chronic disease  during today's visit Hypertension (HTN)  General Interventions   General Interventions Discussed/Reviewed General Interventions Discussed, General Interventions Reviewed, Doctor Visits, Durable Medical Equipment (DME), Communication with  Doctor Visits Discussed/Reviewed Doctor Visits Discussed, Doctor Visits Reviewed, PCP  Durable Medical Equipment (DME) BP Cuff  Communication with RN  Juanell Fairly RN]  Education Interventions   Provided Verbal Education On When to see the doctor, Medication  Pharmacy Interventions   Pharmacy Dicussed/Reviewed Pharmacy Topics Discussed, Pharmacy Topics Reviewed, Medication Adherence          SDOH assessments and interventions completed:  No     Care Coordination Interventions:  Yes, provided   Follow up plan: Follow up call scheduled for 09/30/23 @1 :30 PM    Encounter Outcome:  Patient Visit Completed

## 2023-08-17 NOTE — Patient Instructions (Signed)
 Visit Information  Thank you for taking time to visit with me today. Please don't hesitate to contact me if I can be of assistance to you.   Following are the goals we discussed today:   Goals Addressed             This Visit's Progress    To improve medication adherence   On track    Care Coordination Interventions: Completed successful outbound call with patient  Evaluation of current treatment plan related to hypertension self management and patient's adherence to plan as established by provider Care Coordination Interventions: Reviewed medications with patient and discussed importance of compliance, patient reports ongoing adherence with BP medications since having [facility] deliver her medications Advised patient, providing education and rationale, to monitor blood pressure daily and record, calling PCP for findings outside established parameters, patient reports BP readings at home are running in the 130's/80's Assessed social determinant of health barriers Reviewed and discussed with patient her recently missed PCP office visit, patient states her tire was flat right prior to leaving for her appointment Reviewed and discussed with patient her next scheduled PCP follow up with Dr. Alvis Lemmings is scheduled for 09/28/23 @09 :10 AM Informed patient of nurse case manager transfer to Juanell Fairly RN Scheduled nurse follow up call with patient for 09/30/23 @1 :30 PM Last practice recorded BP readings:  BP Readings from Last 3 Encounters:  07/04/23 (!) 178/79  06/22/23 (!) 147/86  05/16/23 (!) 154/82   Most recent eGFR/CrCl:  Lab Results  Component Value Date   EGFR 97 02/25/2022    No components found for: "CRCL"        Your next appointment is by telephone on 09/30/23 at 1:30 PM  Please call the care guide team at [phone] if you need to cancel or reschedule your appointment.   If you are experiencing a Mental Health or Behavioral Health Crisis or need someone to talk to,  please call 1-800-273-TALK (toll free, 24 hour hotline)  The patient verbalized understanding of instructions, educational materials, and care plan provided today and DECLINED offer to receive copy of patient instructions, educational materials, and care plan.   Delsa Sale RN BSN CCM [place]  [facility], [facility] Nurse Care Coordinator  Direct Dial: [phone] Website: [email or web address]

## 2023-08-17 NOTE — Telephone Encounter (Signed)
 Copied from CRM [id number]. Topic: Clinical - Lab/Test Results >> Aug 17, 2023  9:56 AM Almira Coaster wrote: Reason for CRM: Patient had a CT lung cancer screening done on 08/11/2023 and was calling for results, advised that results are not in as of yet. Patient would like to know if there is any way the office can call to expedite the reading.

## 2023-08-18 NOTE — Telephone Encounter (Signed)
 Attempted to contact patient and mailbox is currently full.

## 2023-08-18 NOTE — Telephone Encounter (Signed)
 Can you please inform the patient accordingly?  Thank you.

## 2023-08-22 ENCOUNTER — Telehealth: Payer: Self-pay

## 2023-08-22 NOTE — Telephone Encounter (Signed)
 Copied from CRM [id number]. Topic: Clinical - Lab/Test Results >> Aug 17, 2023  9:56 AM Almira Coaster wrote: Reason for CRM: Patient had a CT lung cancer screening done on 08/11/2023 and was calling for results, advised that results are not in as of yet. Patient would like to know if there is any way the office can call to expedite the reading. >> Aug 22, 2023 10:24 AM Ivette P wrote: Pt called in to follow up on results for test performed on 03/20. Pt would like an update. Please reach out

## 2023-08-23 ENCOUNTER — Other Ambulatory Visit: Payer: Self-pay | Admitting: Family Medicine

## 2023-08-23 ENCOUNTER — Other Ambulatory Visit (HOSPITAL_COMMUNITY): Payer: Self-pay

## 2023-08-23 DIAGNOSIS — Z794 Long term (current) use of insulin: Secondary | ICD-10-CM

## 2023-08-23 MED ORDER — ACCU-CHEK GUIDE TEST VI STRP
ORAL_STRIP | 6 refills | Status: DC
  Filled 2023-08-23: qty 100, 33d supply, fill #0
  Filled 2023-10-12 – 2023-10-24 (×3): qty 100, 33d supply, fill #1

## 2023-08-23 MED ORDER — ONETOUCH DELICA PLUS LANCET33G MISC
6 refills | Status: DC
  Filled 2023-08-23: qty 100, 33d supply, fill #0
  Filled 2023-10-12 – 2023-10-24 (×3): qty 100, 33d supply, fill #1

## 2023-08-24 ENCOUNTER — Other Ambulatory Visit: Payer: Self-pay

## 2023-08-24 ENCOUNTER — Other Ambulatory Visit (HOSPITAL_COMMUNITY): Payer: Self-pay

## 2023-08-24 NOTE — Telephone Encounter (Signed)
 Vm was left informing patient that the CT scan reading room is currently behind and as soon as we get the results we will give her a call.

## 2023-08-25 ENCOUNTER — Telehealth: Payer: Self-pay

## 2023-08-25 ENCOUNTER — Other Ambulatory Visit: Payer: Self-pay

## 2023-08-25 ENCOUNTER — Other Ambulatory Visit (HOSPITAL_COMMUNITY): Payer: Self-pay

## 2023-08-25 ENCOUNTER — Other Ambulatory Visit: Payer: Self-pay | Admitting: Family Medicine

## 2023-08-25 MED ORDER — ASPIRIN 81 MG PO TBEC: 81.0000 mg | DELAYED_RELEASE_TABLET | Freq: Every day | ORAL | 1 refills | Status: AC

## 2023-08-25 MED ORDER — ACCU-CHEK GUIDE W/DEVICE KIT
PACK | 0 refills | Status: DC
  Filled 2023-08-25: qty 1, 1d supply, fill #0

## 2023-08-25 NOTE — Telephone Encounter (Signed)
 Copied from CRM [id number]. Topic: General - Other >> Aug 25, 2023  2:03 PM Yolanda T wrote: Reason for CRM: Arianne called requested [employer] form completed and sent back asap complete diagnoses of diabetes, insulin details, continuous glucose monitor, treatment plan for diabetes, signed script from provider and most recent visit notes faxed to [phone]. Please f/u with Arianne at [phone]

## 2023-08-26 ENCOUNTER — Telehealth: Payer: Self-pay | Admitting: Family Medicine

## 2023-08-26 ENCOUNTER — Other Ambulatory Visit: Payer: Self-pay

## 2023-08-26 ENCOUNTER — Other Ambulatory Visit (HOSPITAL_COMMUNITY): Payer: Self-pay

## 2023-08-26 NOTE — Telephone Encounter (Signed)
 Form has not been received.

## 2023-08-26 NOTE — Telephone Encounter (Signed)
 Please advise patient.  Copied from CRM [id number]. Topic: Clinical - Medication Question >> Aug 26, 2023 10:46 AM Izetta Dakin wrote: Reason for CRM: Patient states that she received One Touch Insulin needles, but have not received One Touch machine. Patient requesting a callback.

## 2023-08-26 NOTE — Telephone Encounter (Signed)
VM left informing patient to return phone call.

## 2023-08-27 ENCOUNTER — Other Ambulatory Visit (HOSPITAL_COMMUNITY): Payer: Self-pay

## 2023-08-29 ENCOUNTER — Telehealth: Payer: Self-pay | Admitting: Family Medicine

## 2023-08-29 NOTE — Telephone Encounter (Signed)
 Copied from CRM [id number]. Topic: General - Call Back - No Documentation >> Aug 29, 2023  9:55 AM Higinio Roger wrote: Reason for CRM: Patient is requesting a callback when imaging results are available  Callback #: [phone]

## 2023-08-29 NOTE — Telephone Encounter (Signed)
 Noted.

## 2023-08-30 ENCOUNTER — Ambulatory Visit (HOSPITAL_COMMUNITY)
Admission: EM | Admit: 2023-08-30 | Discharge: 2023-08-30 | Disposition: A | Discharge status: Home / Self Care | Attending: Family Medicine | Admitting: Family Medicine

## 2023-08-30 ENCOUNTER — Other Ambulatory Visit (HOSPITAL_COMMUNITY): Payer: Self-pay

## 2023-08-30 ENCOUNTER — Encounter (HOSPITAL_COMMUNITY): Payer: Self-pay

## 2023-08-30 DIAGNOSIS — J069 Acute upper respiratory infection, unspecified: Secondary | ICD-10-CM

## 2023-08-30 LAB — POC SARS CORONAVIRUS 2 AG -  ED: SARS Coronavirus 2 Ag: NEGATIVE

## 2023-08-30 MED ORDER — BENZONATATE 200 MG PO CAPS
200.0000 mg | ORAL_CAPSULE | Freq: Three times a day (TID) | ORAL | 0 refills | Status: DC | PRN
  Filled 2023-08-30 (×2): qty 21, 7d supply, fill #0

## 2023-08-30 NOTE — Discharge Instructions (Addendum)
 You were seen today for upper respiratory symptoms.  Your covid swab was negative today.  This is likely due to another virus.  I have sent medication for your cough.  You may take over the counter claritin/zyrtec for any sinus congestion/drainage.  Please return if not improving or worsening.

## 2023-08-30 NOTE — ED Provider Notes (Signed)
 [facility]    CSN: [id number] Arrival date & time: 08/30/23  1315      History   Chief Complaint Chief Complaint  Patient presents with   Cough    HPI Sarah Charles is a 63 y.o. female.    Cough Associated symptoms: rhinorrhea   Associated symptoms: no shortness of breath and no wheezing    Patient is here for coughing spells for the last several days.  Worse at night.  No fevers/chills.  Mild runny nose, congestion.  No wheezing or sob.  No n/v.  No known sick contacts.  No otc meds taken.       Past Medical History:  Diagnosis Date   Arthritis    Coronary artery calcification seen on CT scan    a. 07/2017 noted on CT abd.   Depression    GERD (gastroesophageal reflux disease)    Hyperlipidemia    Hypertension    Obesity    Sleep apnea    a. Does not use CPAP "cause i dont think I need it anymore".   Tobacco abuse    Type II diabetes mellitus [facility])     Patient Active Problem List   Diagnosis Date Noted   Abnormal findings on diagnostic imaging of digestive system 02/06/2023   Infectious colitis 02/06/2023   Acute blood loss anemia 02/05/2023   Pancolitis (HCC) 02/04/2023   Rectal bleeding 02/04/2023   Acute diarrhea 02/04/2023   Imaging of gastrointestinal tract abnormal 02/04/2023   Colitis with rectal bleeding 02/03/2023   DKA (diabetic ketoacidosis) (HCC) 12/17/2022   Cellulitis 12/17/2022   Nausea and vomiting 12/17/2022   Hypertensive urgency 12/17/2022   Hyperlipidemia LDL goal <70 07/19/2022   Sleep apnea, unspecified 11/26/2020   CAD (coronary artery disease) 11/22/2017   Chest pain, precordial 11/04/2017   Acute chest pain    Type 2 diabetes mellitus with hyperlipidemia (HCC) 10/29/2017   Diabetic polyneuropathy associated with type 2 diabetes mellitus (HCC) 05/30/2017   Mixed dyslipidemia 05/30/2017   Depression 05/31/2016   Uncontrolled type 2 diabetes mellitus with complication 04/08/2016   Uncontrolled type 2  diabetes mellitus with complication, with long-term current use of insulin 04/20/2015   Cellulitis of breast 12/29/2013   Diabetes mellitus (HCC) 12/29/2013   Cellulitis of female breast 12/29/2013   Symptomatic menopausal or female climacteric states 04/08/2008   OTHER SPECIFIED DISEASE OF NAIL 04/08/2008   URINARY URGENCY 04/08/2008   COUGH 04/02/2008   FATIGUE 04/12/2007   Hypertension 04/12/2007   OBESITY, NOS 07/21/2006   Major depressive disorder, recurrent episode (HCC) 07/21/2006   TOBACCO DEPENDENCE 07/21/2006   GASTROESOPHAGEAL REFLUX, NO ESOPHAGITIS 07/21/2006   INCONTINENCE, STRESS, FEMALE 07/21/2006    Past Surgical History:  Procedure Laterality Date   BIOPSY  12/21/2022   Procedure: BIOPSY;  Surgeon: Kerin Salen, MD;  Location: [facility];  Service: Gastroenterology;;   CHOLECYSTECTOMY     CORONARY PRESSURE/FFR STUDY N/A 11/04/2017   Procedure: INTRAVASCULAR PRESSURE WIRE/FFR STUDY;  Surgeon: Swaziland, Peter M, MD;  Location: [facility] INVASIVE CV LAB;  Service: Cardiovascular;  Laterality: N/A;   CORONARY STENT INTERVENTION N/A 11/04/2017   Procedure: CORONARY STENT INTERVENTION;  Surgeon: Swaziland, Peter M, MD;  Location: [facility] INVASIVE CV LAB;  Service: Cardiovascular;  Laterality: N/A;   ESOPHAGOGASTRODUODENOSCOPY (EGD) WITH PROPOFOL N/A 12/21/2022   Procedure: ESOPHAGOGASTRODUODENOSCOPY (EGD) WITH PROPOFOL;  Surgeon: Kerin Salen, MD;  Location: [facility];  Service: Gastroenterology;  Laterality: N/A;   INCISION AND DRAINAGE ABSCESS Left 12/22/2012   Procedure:  INCISION AND DRAINAGE ABSCESS;  Surgeon: Dalia Heading, MD;  Location: [facility];  Service: General;  Laterality: Left;   LEFT HEART CATH AND CORONARY ANGIOGRAPHY N/A 11/04/2017   Procedure: LEFT HEART CATH AND CORONARY ANGIOGRAPHY;  Surgeon: Swaziland, Peter M, MD;  Location: [facility] INVASIVE CV LAB;  Service: Cardiovascular;  Laterality: N/A;   TONSILLECTOMY     TUBAL LIGATION      OB History   No obstetric history on file.       Home Medications    Prior to Admission medications   Medication Sig Start Date End Date Taking? Authorizing Provider  Lancets [facility] DELICA PLUS [place]) MISC Use to check blood sugar 3 times daily. 08/23/23   [name], MD  amoxicillin-clavulanate (AUGMENTIN) 875-125 MG tablet Take 1 tablet by mouth every 12 (twelve) hours. Patient not taking: Reported on 06/22/2023 05/13/23   Quintella Reichert  aspirin EC 81 MG tablet Take 1 tablet (81 mg total) by mouth daily. Swallow whole. 08/25/23   [name], MD  benzonatate (TESSALON) 100 MG capsule Take 1 capsule (100 mg total) by mouth every 8 (eight) hours as needed for cough. Patient not taking: Reported on 07/21/2023 05/13/23   Landis Martins, PA-C  Blood Glucose Monitoring Suppl (ACCU-CHEK GUIDE) w/Device KIT Use to check blood sugar 3 times daily. 08/25/23   [name], MD  buPROPion (WELLBUTRIN SR) 150 MG 12 hr tablet Take 1 tablet (150 mg total) by mouth 2 (two) times daily. 07/21/23   [name], MD  carvedilol (COREG) 3.125 MG tablet Take 1 tablet (3.125 mg total) by mouth 2 (two) times daily with a meal. 07/21/23   [name], MD  cetirizine (ZYRTEC) 10 MG tablet TAKE 1 TABLET BY MOUTH EVERY DAY Patient taking differently: Take 10 mg by mouth daily. 08/02/19   [name], MD  clotrimazole (LOTRIMIN) 1 % cream APPLY TO THE AFFECTED AREA(S) beneath BOTH breasts TWICE DAILY Patient taking differently: Apply 1 Application topically 2 (two) times daily. 03/08/22   [name], MD  diclofenac Sodium (VOLTAREN) 1 % GEL Apply 4 g topically 4 (four) times daily. 05/05/21   [name], MD  diphenhydrAMINE (BENADRYL) 25 MG tablet Take 1 tablet (25 mg total) by mouth every 6 (six) hours as needed. 05/01/23   Garrison, [place] N, FNP  fenofibrate (TRICOR) 145 MG tablet Take 1 tablet (145 mg total) by mouth every evening. 07/21/23   [name], MD  FISH OIL-CHOLECALCIFEROL PO Take by mouth. Patient  not taking: Reported on 07/21/2023    [provider]  FLUoxetine (PROZAC) 20 MG capsule Take 1 capsule (20 mg total) by mouth every morning. 07/21/23   [name], MD  furosemide (LASIX) 20 MG tablet Take 1 tablet (20 mg total) by mouth daily. 07/21/23   [name], MD  gabapentin (NEURONTIN) 300 MG capsule Take 2 capsules (600 mg total) by mouth 2 (two) times daily. 07/21/23   [name], MD  glucose blood (ACCU-CHEK GUIDE TEST) test strip Use to check blood sugar 3 times daily. 08/23/23   [name], MD  insulin glargine (LANTUS SOLOSTAR) 100 UNIT/ML Solostar Pen Inject 33 Units into the skin 2 (two) times daily. 07/21/23   [name], MD  Insulin Pen Needle (COMFORT EZ PEN NEEDLES) 31G X 5 MM MISC Inject 1 each into the skin in the morning and at bedtime. use as directed 07/21/23   [name], MD  Lancets Misc. (ACCU-CHEK FASTCLIX LANCET) KIT USE AS DIRECTED 11/12/21  [name], MD  lidocaine (LIDODERM) 5 % Place ONE PATCH onto THE SKIN daily. REMOVE & Discard PATCH WITHIN 12 hours OR as directed by MD Patient taking differently: Place 1 patch onto the skin every 12 (twelve) hours. 12/30/22   [name], MD  lisinopril (ZESTRIL) 40 MG tablet Take 1 tablet (40 mg total) by mouth daily. 07/21/23   [name], MD  metFORMIN (GLUCOPHAGE) 500 MG tablet Take 2 tablets (1,000 mg total) by mouth 2 (two) times daily. 07/21/23   [name], MD  methocarbamol (ROBAXIN) 750 MG tablet Take 1 tablet (750 mg total) by mouth every 8 (eight) hours as needed for muscle spasms. 06/22/23   Anders Simmonds, PA-C  metoCLOPramide (REGLAN) 10 MG tablet Take 1 tablet (10 mg total) by mouth every 8 (eight) hours as needed for nausea and vomiting 07/21/23   [name], MD  Multiple Vitamin (MULTIVITAMIN) tablet Take 1 tablet by mouth daily. Patient not taking: Reported on 07/21/2023    [provider]  mupirocin ointment (BACTROBAN) 2 % Apply 1 Application  topically 2 (two) times daily. 05/01/23   Garrison, [place] N, FNP  nitroGLYCERIN (NITROSTAT) 0.4 MG SL tablet Place 0.4 mg under the tongue every 5 (five) minutes as needed for chest pain. Patient not taking: Reported on 07/21/2023    [provider]  oxyCODONE (OXY IR/ROXICODONE) 5 MG immediate release tablet Take 5 mg by mouth every 4 (four) hours as needed for moderate pain. 11/29/22   [provider]  pantoprazole (PROTONIX) 40 MG tablet Take 1 tablet (40 mg total) by mouth 2 (two) times daily. 07/21/23   [name], MD  permethrin (ELIMITE) 5 % cream APPLY 1 APPLICATION TOPICALLY ONCE FOR 1 DOSE. FROM HEAD TO TOE AND LEAVE IN PLACE FOR 8 TO 14 HOURS AND WASHOUT 07/21/23   [name], MD  rosuvastatin (CRESTOR) 40 MG tablet Take 1 tablet (40 mg total) by mouth every evening. 07/21/23   [name], MD  silver sulfADIAZINE (SILVADENE) 1 % cream Apply 1 Application topically daily. Patient not taking: Reported on 07/04/2023 11/21/22   Lonell Grandchild, MD  traZODone (DESYREL) 50 MG tablet Take 1 tablet (50 mg total) by mouth at bedtime. 07/21/23   [name], MD  triamcinolone cream (KENALOG) 0.1 % Apply 1 Application topically 2 (two) times daily. 07/21/23   [name], MD  vitamin B-12 (CYANOCOBALAMIN) 500 MCG tablet Take 1 tablet (500 mcg total) by mouth daily. 07/21/23   [name], MD  vitamin C (ASCORBIC ACID) 500 MG tablet Take 500 mg by mouth daily.    [provider]    Family History Family History  Problem Relation Age of Onset   Hypertension Mother    CVA Mother    COPD Mother    Heart disease Mother    Kidney Stones Mother    Heart attack Mother        MI in late 61's   Breast cancer Mother    CAD Father    Hypercholesterolemia Father    Heart attack Father        Died @ 40 of MI   Diabetes Sister    Cancer Maternal Uncle    Diabetes Maternal Grandmother    Cancer Maternal Grandfather        lung cancer   Colon cancer  Neg Hx    Esophageal cancer Neg Hx    Stomach cancer Neg Hx    Pancreatic cancer Neg Hx  Social History Social History   Tobacco Use   Smoking status: Every Day    Current packs/day: 0.00    Average packs/day: 1 pack/day for 41.0 years (41.0 ttl pk-yrs)    Types: Cigarettes    Start date: 10/06/1977    Last attempt to quit: 10/07/2018    Years since quitting: 4.8   Smokeless tobacco: Never  Vaping Use   Vaping status: Never Used  Substance Use Topics   Alcohol use: No   Drug use: No     Allergies   Cymbalta [duloxetine hcl]   Review of Systems Review of Systems  Constitutional: Negative.   HENT:  Positive for congestion and rhinorrhea.   Respiratory:  Positive for cough. Negative for shortness of breath and wheezing.   Cardiovascular: Negative.   Gastrointestinal: Negative.   Genitourinary: Negative.   Musculoskeletal: Negative.   Psychiatric/Behavioral: Negative.       Physical Exam Triage Vital Signs ED Triage Vitals  Encounter Vitals Group     BP 08/30/23 1350 138/74     Systolic BP Percentile --      Diastolic BP Percentile --      Pulse Rate 08/30/23 1350 71     Resp 08/30/23 1350 20     Temp 08/30/23 1350 (!) 97.5 F (36.4 C)     Temp Source 08/30/23 1350 Oral     SpO2 08/30/23 1350 95 %     Weight --      Height --      Head Circumference --      Peak Flow --      Pain Score 08/30/23 1352 0     Pain Loc --      Pain Education --      Exclude from Growth Chart --    No data found.  Updated Vital Signs BP 138/74 (BP Location: Left Arm)   Pulse 71   Temp (!) 97.5 F (36.4 C) (Oral)   Resp 20   SpO2 95%   Visual Acuity Right Eye Distance:   Left Eye Distance:   Bilateral Distance:    Right Eye Near:   Left Eye Near:    Bilateral Near:     Physical Exam Constitutional:      General: She is not in acute distress.    Appearance: Normal appearance. She is normal weight. She is not ill-appearing or toxic-appearing.  HENT:      Nose: Congestion present. No rhinorrhea.     Mouth/Throat:     Mouth: Mucous membranes are moist.     Pharynx: Posterior oropharyngeal erythema present.  Cardiovascular:     Rate and Rhythm: Normal rate and regular rhythm.  Pulmonary:     Effort: Pulmonary effort is normal.     Breath sounds: Normal breath sounds.  Musculoskeletal:     Cervical back: Normal range of motion. No tenderness.  Skin:    General: Skin is warm.  Neurological:     General: No focal deficit present.     Mental Status: She is alert.  Psychiatric:        Mood and Affect: Mood normal.      UC Treatments / Results  Labs (all labs ordered are listed, but only abnormal results are displayed) Labs Reviewed  POC COVID19/FLU A&B COMBO    EKG   Radiology No results found.  Procedures Procedures (including critical care time)  Medications Ordered in UC Medications - No data to display  Initial Impression / Assessment and Plan /  UC Course  I have reviewed the triage vital signs and the nursing notes.  Pertinent labs & imaging results that were available during my care of the patient were reviewed by me and considered in my medical decision making (see chart for details).   Final Clinical Impressions(s) / UC Diagnoses   Final diagnoses:  Viral URI with cough     Discharge Instructions      You were seen today for upper respiratory symptoms.  Your covid swab was negative today.  This is likely due to another virus.  I have sent medication for your cough.  You may take over the counter claritin/zyrtec for any sinus congestion/drainage.  Please return if not improving or worsening.     ED Prescriptions     Medication Sig Dispense Auth. Provider   benzonatate (TESSALON) 200 MG capsule Take 1 capsule (200 mg total) by mouth 3 (three) times daily as needed for cough. 21 capsule Jannifer Franklin, MD      PDMP not reviewed this encounter.   Jannifer Franklin, MD 08/30/23 [phone]

## 2023-08-30 NOTE — ED Notes (Addendum)
Pt declined covid/flu swab.

## 2023-08-30 NOTE — ED Triage Notes (Signed)
 Pt c/o having very hard coughing spells and losing her voice since Sunday evening.

## 2023-09-05 ENCOUNTER — Ambulatory Visit: Payer: Self-pay

## 2023-09-05 NOTE — Telephone Encounter (Signed)
 Copied from CRM [id number]. Topic: Clinical - Red Word Triage >> Sep 05, 2023  9:09 AM Rosamond Comes wrote: Red Word that prompted transfer to Nurse Triage: patient calling in, sciatic pain shooting down right, 9 pain scale,  Chief Complaint: "sciatic pain"  Symptoms: pain to right side of back goes to right leg Frequency: "going on forever" Pertinent Negatives: Patient denies cp, numbness, tingling, headache Disposition: [] ED /[] Urgent Care (no appt availability in office) / [] Appointment(In office/virtual)/ [x]  [facility]/ [] Home Care/ [] Refused Recommended Disposition /[] [facility]/ []  Follow-up with PCP Additional Notes: per protocol apt made; care advice given, denies questions; instructed to go to ER if becomes worse.   Reason for Disposition  Back pain present > 2 weeks  Answer Assessment - Initial Assessment Questions 1. ONSET: "When did the pain begin?"      "Forever" 2. LOCATION: "Where does it hurt?" (upper, mid or lower back)     Right side; sciatic pain, shotting down right leg 3. SEVERITY: "How bad is the pain?"  (e.g., Scale 1-10; mild, moderate, or severe)   - MILD (1-3): Doesn't interfere with normal activities.    - MODERATE (4-7): Interferes with normal activities or awakens from sleep.    - SEVERE (8-10): Excruciating pain, unable to do any normal activities.      9/10 4. PATTERN: "Is the pain constant?" (e.g., yes, no; constant, intermittent)      constant 5. RADIATION: "Does the pain shoot into your legs or somewhere else?"     Right leg 6. CAUSE:  "What do you think is causing the back pain?"      sciatic 7. BACK OVERUSE:  "Any recent lifting of heavy objects, strenuous work or exercise?"     denies 8. MEDICINES: "What have you taken so far for the pain?" (e.g., nothing, acetaminophen, NSAIDS)     denies 9. NEUROLOGIC SYMPTOMS: "Do you have any weakness, numbness, or problems with bowel/bladder control?"     denies 10. OTHER SYMPTOMS: "Do  you have any other symptoms?" (e.g., fever, abdomen pain, burning with urination, blood in urine)       denies 11. PREGNANCY: "Is there any chance you are pregnant?" "When was your last menstrual period?"       na  Protocols used: Back Pain-A-AH

## 2023-09-14 ENCOUNTER — Other Ambulatory Visit: Payer: Self-pay

## 2023-09-14 ENCOUNTER — Emergency Department (HOSPITAL_COMMUNITY)
Admission: EM | Admit: 2023-09-14 | Discharge: 2023-09-15 | Discharge status: Against Medical Advice | Attending: Emergency Medicine | Admitting: Emergency Medicine

## 2023-09-14 ENCOUNTER — Encounter (HOSPITAL_COMMUNITY): Payer: Self-pay | Admitting: *Deleted

## 2023-09-14 DIAGNOSIS — Z7982 Long term (current) use of aspirin: Secondary | ICD-10-CM | POA: Diagnosis not present

## 2023-09-14 DIAGNOSIS — M5416 Radiculopathy, lumbar region: Secondary | ICD-10-CM | POA: Diagnosis not present

## 2023-09-14 DIAGNOSIS — Z5321 Procedure and treatment not carried out due to patient leaving prior to being seen by health care provider: Secondary | ICD-10-CM | POA: Insufficient documentation

## 2023-09-14 DIAGNOSIS — M545 Low back pain, unspecified: Secondary | ICD-10-CM | POA: Diagnosis present

## 2023-09-14 DIAGNOSIS — Z794 Long term (current) use of insulin: Secondary | ICD-10-CM | POA: Diagnosis not present

## 2023-09-14 DIAGNOSIS — M79604 Pain in right leg: Secondary | ICD-10-CM | POA: Insufficient documentation

## 2023-09-14 MED ORDER — KETOROLAC TROMETHAMINE 30 MG/ML IJ SOLN
30.0000 mg | Freq: Once | INTRAMUSCULAR | Status: AC
  Administered 2023-09-14: 30 mg via INTRAMUSCULAR
  Filled 2023-09-14: qty 1

## 2023-09-14 NOTE — ED Triage Notes (Signed)
 The pt woke up from a nap earlier today and she started  hurting in her entire rt leg  no known injury but she has had this previously and she has a doctors appointment tomorrow

## 2023-09-14 NOTE — ED Provider Triage Note (Signed)
 Emergency Medicine Provider Triage Evaluation Note  Sarah Charles , a 63 y.o. female  was evaluated in triage.  Pt complains of right leg pain.  Has hx of sciatica.  States that the pain radiates down the leg.  States that she feels uneasy on her feet.  Has tried gabapentin  without relief.  Review of Systems  Positive: Leg pain Negative: fever  Physical Exam  BP (!) 142/64 (BP Location: Right Arm)   Pulse 73   Resp 14   Ht 5\' 3"  (1.6 m)   Wt 82.1 kg   SpO2 96%   BMI 32.06 kg/m  Gen:   Awake, no distress   Resp:  Normal effort  MSK:   Moves extremities without difficulty  Other:    Medical Decision Making  Medically screening exam initiated at 10:33 PM.  Appropriate orders placed.  Sarah Charles was informed that the remainder of the evaluation will be completed by another provider, this initial triage assessment does not replace that evaluation, and the importance of remaining in the ED until their evaluation is complete.     Sherel Dikes, PA-C 09/14/23 2234

## 2023-09-15 ENCOUNTER — Emergency Department (HOSPITAL_COMMUNITY)
Admission: EM | Admit: 2023-09-15 | Discharge: 2023-09-16 | Disposition: A | Discharge status: Home / Self Care | Attending: Emergency Medicine | Admitting: Emergency Medicine

## 2023-09-15 ENCOUNTER — Ambulatory Visit: Payer: Self-pay | Attending: Physician Assistant | Admitting: Physician Assistant

## 2023-09-15 VITALS — BP 188/82 | HR 65 | Resp 16 | Wt 195.0 lb

## 2023-09-15 DIAGNOSIS — M79604 Pain in right leg: Secondary | ICD-10-CM | POA: Diagnosis not present

## 2023-09-15 DIAGNOSIS — Z794 Long term (current) use of insulin: Secondary | ICD-10-CM

## 2023-09-15 DIAGNOSIS — Z7982 Long term (current) use of aspirin: Secondary | ICD-10-CM | POA: Insufficient documentation

## 2023-09-15 DIAGNOSIS — M5431 Sciatica, right side: Secondary | ICD-10-CM

## 2023-09-15 DIAGNOSIS — G8929 Other chronic pain: Secondary | ICD-10-CM

## 2023-09-15 DIAGNOSIS — E1165 Type 2 diabetes mellitus with hyperglycemia: Secondary | ICD-10-CM

## 2023-09-15 DIAGNOSIS — M5416 Radiculopathy, lumbar region: Secondary | ICD-10-CM | POA: Diagnosis not present

## 2023-09-15 DIAGNOSIS — N3001 Acute cystitis with hematuria: Secondary | ICD-10-CM | POA: Diagnosis not present

## 2023-09-15 LAB — GLUCOSE, POCT (MANUAL RESULT ENTRY): POC Glucose: 138 mg/dL — AB (ref 70–99)

## 2023-09-15 LAB — POCT URINALYSIS DIP (CLINITEK)
Bilirubin, UA: NEGATIVE
Blood, UA: NEGATIVE
Glucose, UA: NEGATIVE mg/dL
Ketones, POC UA: NEGATIVE mg/dL
Leukocytes, UA: NEGATIVE
Nitrite, UA: POSITIVE — AB
POC PROTEIN,UA: NEGATIVE
Spec Grav, UA: 1.025 (ref 1.010–1.025)
Urobilinogen, UA: 0.2 U/dL
pH, UA: 5 (ref 5.0–8.0)

## 2023-09-15 LAB — POCT GLYCOSYLATED HEMOGLOBIN (HGB A1C): HbA1c, POC (controlled diabetic range): 8.5 % — AB (ref 0.0–7.0)

## 2023-09-15 MED ORDER — PREDNISONE 10 MG PO TABS
ORAL_TABLET | ORAL | 0 refills | Status: DC
Start: 2023-09-15 — End: 2023-09-16

## 2023-09-15 MED ORDER — METHOCARBAMOL 750 MG PO TABS: 750.0000 mg | ORAL_TABLET | Freq: Three times a day (TID) | ORAL | 0 refills | Status: DC | PRN

## 2023-09-15 MED ORDER — SULFAMETHOXAZOLE-TRIMETHOPRIM 800-160 MG PO TABS: 1.0000 | ORAL_TABLET | Freq: Two times a day (BID) | ORAL | 0 refills | Status: DC

## 2023-09-15 MED ORDER — FLUCONAZOLE 150 MG PO TABS: 150.0000 mg | ORAL_TABLET | Freq: Once | ORAL | 0 refills | Status: AC

## 2023-09-15 NOTE — Patient Instructions (Addendum)
 Drink 64 to 80 ounces water daily. You have a urinary tract infection that may be causing your back pain or at least contributing to it  Take lantus  twice daily

## 2023-09-15 NOTE — Progress Notes (Signed)
 Patient ID: Sarah Charles, female   DOB: 06-Apr-1961, 63 y.o.   MRN: [id number]   Sarah Charles, is a 63 y.o. female  UVO:[id number]  [id number]:[id number]  DOB - 14-Jul-1960  Chief Complaint  Patient presents with   Diabetes       Subjective:   Sarah Charles is a 63 y.o. female here today for R sciatica that occurs periodically.  Started yesterday.  She has a fair amount of back pain and is requesting Physical therapy at "[facility]" if possible.  She did not increase and divide her insulin  at last visit as we discussed. She has been taking 60 un lantus  daily but admits to forgetting some days.  She went to the ED last night and was given toradol  which did not help but she left without being seen.  No problems updated.  ALLERGIES: Allergies  Allergen Reactions   Cymbalta [Duloxetine Hcl] Nausea Only and Other (See Comments)    Nausea, lack of therapeutic effect    PAST MEDICAL HISTORY: Past Medical History:  Diagnosis Date   Arthritis    Coronary artery calcification seen on CT scan    a. 07/2017 noted on CT abd.   Depression    GERD (gastroesophageal reflux disease)    Hyperlipidemia    Hypertension    Obesity    Sleep apnea    a. Does not use CPAP "cause i dont think I need it anymore".   Tobacco abuse    Type II diabetes mellitus (HCC)     MEDICATIONS AT HOME: Prior to Admission medications   Medication Sig Start Date End Date Taking? Authorizing Provider  benzonatate  (TESSALON ) 200 MG capsule Take 1 capsule (200 mg total) by mouth 3 (three) times daily as needed for cough. 08/30/23  Yes Piontek, Erin, MD  buPROPion  (WELLBUTRIN  SR) 150 MG 12 hr tablet Take 1 tablet (150 mg total) by mouth 2 (two) times daily. 07/21/23  Yes Newlin, Enobong, MD  carvedilol  (COREG ) 3.125 MG tablet Take 1 tablet (3.125 mg total) by mouth 2 (two) times daily with a meal. 07/21/23  Yes Newlin, Enobong, MD  fenofibrate  (TRICOR ) 145 MG tablet Take 1 tablet (145 mg total) by mouth every evening. 07/21/23   Yes Newlin, Enobong, MD  FISH OIL-CHOLECALCIFEROL PO Take by mouth.   Yes [provider]  fluconazole  (DIFLUCAN ) 150 MG tablet Take 1 tablet (150 mg total) by mouth once for 1 dose. 09/15/23 09/15/23 Yes Marieta Markov, Stan Eans, PA-C  FLUoxetine  (PROZAC ) 20 MG capsule Take 1 capsule (20 mg total) by mouth every morning. 07/21/23  Yes Newlin, Enobong, MD  furosemide  (LASIX ) 20 MG tablet Take 1 tablet (20 mg total) by mouth daily. 07/21/23  Yes Newlin, Enobong, MD  gabapentin  (NEURONTIN ) 300 MG capsule Take 2 capsules (600 mg total) by mouth 2 (two) times daily. 07/21/23  Yes Newlin, Enobong, MD  lisinopril  (ZESTRIL ) 40 MG tablet Take 1 tablet (40 mg total) by mouth daily. 07/21/23  Yes Newlin, Enobong, MD  metFORMIN  (GLUCOPHAGE ) 500 MG tablet Take 2 tablets (1,000 mg total) by mouth 2 (two) times daily. 07/21/23  Yes Newlin, Enobong, MD  metoCLOPramide  (REGLAN ) 10 MG tablet Take 1 tablet (10 mg total) by mouth every 8 (eight) hours as needed for nausea and vomiting 07/21/23  Yes Newlin, Enobong, MD  pantoprazole  (PROTONIX ) 40 MG tablet Take 1 tablet (40 mg total) by mouth 2 (two) times daily. 07/21/23  Yes Newlin, Enobong, MD  predniSONE  (DELTASONE ) 10 MG tablet 6,5,4,3,2,1 take each days dose in  the morning with food. 09/15/23  Yes Dulce Gibbs M, PA-C  rosuvastatin  (CRESTOR ) 40 MG tablet Take 1 tablet (40 mg total) by mouth every evening. 07/21/23  Yes Newlin, Enobong, MD  sucralfate  (CARAFATE ) 1 g tablet Take 1 g by mouth 4 (four) times daily -  with meals and at bedtime.   Yes [provider]  sulfamethoxazole -trimethoprim  (BACTRIM  DS) 800-160 MG tablet Take 1 tablet by mouth 2 (two) times daily. 09/15/23  Yes Tarisa Paola, Stan Eans, PA-C  traZODone  (DESYREL ) 50 MG tablet Take 1 tablet (50 mg total) by mouth at bedtime. 07/21/23  Yes Newlin, Enobong, MD  Lancets (ONETOUCH DELICA PLUS LANCET33G) MISC Use to check blood sugar 3 times daily. 08/23/23   Newlin, Enobong, MD  aspirin  EC 81 MG tablet Take 1  tablet (81 mg total) by mouth daily. Swallow whole. 08/25/23   Newlin, Enobong, MD  Blood Glucose Monitoring Suppl (ACCU-CHEK GUIDE) w/Device KIT Use to check blood sugar 3 times daily. 08/25/23   Newlin, Enobong, MD  cetirizine  (ZYRTEC ) 10 MG tablet TAKE 1 TABLET BY MOUTH EVERY DAY Patient taking differently: Take 10 mg by mouth daily. 08/02/19   Newlin, Enobong, MD  clotrimazole  (LOTRIMIN ) 1 % cream APPLY TO THE AFFECTED AREA(S) beneath BOTH breasts TWICE DAILY Patient taking differently: Apply 1 Application topically 2 (two) times daily. 03/08/22   Newlin, Enobong, MD  diclofenac  Sodium (VOLTAREN ) 1 % GEL Apply 4 g topically 4 (four) times daily. 05/05/21   Newlin, Enobong, MD  diphenhydrAMINE  (BENADRYL ) 25 MG tablet Take 1 tablet (25 mg total) by mouth every 6 (six) hours as needed. 05/01/23   Harlow Lighter, [place]  N, FNP  glucose blood (ACCU-CHEK GUIDE TEST) test strip Use to check blood sugar 3 times daily. 08/23/23   Newlin, Enobong, MD  insulin  glargine (LANTUS  SOLOSTAR) 100 UNIT/ML Solostar Pen Inject 33 Units into the skin 2 (two) times daily. 07/21/23   Newlin, Enobong, MD  Insulin  Pen Needle (COMFORT EZ PEN NEEDLES) 31G X 5 MM MISC Inject 1 each into the skin in the morning and at bedtime. use as directed 07/21/23   Newlin, Enobong, MD  Lancets Misc. (ACCU-CHEK FASTCLIX LANCET) KIT USE AS DIRECTED 11/12/21   Newlin, Enobong, MD  lidocaine  (LIDODERM ) 5 % Place ONE PATCH onto THE SKIN daily. REMOVE & Discard PATCH WITHIN 12 hours OR as directed by MD Patient taking differently: Place 1 patch onto the skin every 12 (twelve) hours. 12/30/22   Newlin, Enobong, MD  methocarbamol  (ROBAXIN ) 750 MG tablet Take 1 tablet (750 mg total) by mouth every 8 (eight) hours as needed for muscle spasms. 09/15/23   Hassie Lint, PA-C  mupirocin  ointment (BACTROBAN ) 2 % Apply 1 Application topically 2 (two) times daily. 05/01/23   Harlow Lighter, [place]  N, FNP  nitroGLYCERIN  (NITROSTAT ) 0.4 MG SL tablet Place 0.4 mg under the  tongue every 5 (five) minutes as needed for chest pain. Patient not taking: Reported on 07/21/2023    [provider]  oxyCODONE  (OXY IR/ROXICODONE ) 5 MG immediate release tablet Take 5 mg by mouth every 4 (four) hours as needed for moderate pain. 11/29/22   [provider]  triamcinolone  cream (KENALOG ) 0.1 % Apply 1 Application topically 2 (two) times daily. Patient not taking: Reported on 09/15/2023 07/21/23   Newlin, Enobong, MD    ROS: Neg HEENT Neg resp Neg cardiac Neg GI Neg psych Neg neuro  Objective:   Vitals:   09/15/23 1051  BP: (!) 188/82  Pulse: 65  Resp: 16  SpO2: 96%  Weight: 195 lb (88.5 kg)   Exam General appearance : Awake, alert, not in any distress. Speech Clear. Not toxic looking HEENT: Atraumatic and Normocephalic Neck: Supple, no JVD. No cervical lymphadenopathy.  Chest: Good air entry bilaterally, CTAB.  No rales/rhonchi/wheezing CVS: S1 S2 regular, no murmurs.  Extremities: B/L Lower Ext shows no edema, both legs are warm to touch. BLE DTR=intact  Neg SLR.  Exam limited sue to her being in pain.  Some TTP over R greater trochanter and in S-I joint on R.   Neurology: Awake alert, and oriented X 3, CN II-XII intact, Non focal Skin: No Rash  Data Review Lab Results  Component Value Date   HGBA1C 8.2 (A) 05/16/2023   HGBA1C 10.7 (H) 02/03/2023   HGBA1C 9.5 (A) 11/23/2022    Assessment & Plan   1. Type 2 diabetes mellitus with hyperglycemia, with long-term current use of insulin  (HCC) (Primary) A1C=8.5 today Not at goal-divide insulin  and should be taking 33 u bid.  She repeated it back to me and verbalized understanding - Glucose (CBG) - HgB A1c  2. Back pain with right-sided sciatica Discussed possible hyperglycemia - POCT URINALYSIS DIP (CLINITEK) - predniSONE  (DELTASONE ) 10 MG tablet; 6,5,4,3,2,1 take each days dose in the morning with food.  Dispense: 21 tablet; Refill: 0 - methocarbamol  (ROBAXIN ) 750 MG tablet; Take 1  tablet (750 mg total) by mouth every 8 (eight) hours as needed for muscle spasms.  Dispense: 90 tablet; Refill: 0 - Ambulatory referral to Physical Therapy  3. 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  Dispense: 90 tablet; Refill: 0  4. Acute cystitis with hematuria - sulfamethoxazole -trimethoprim  (BACTRIM  DS) 800-160 MG tablet; Take 1 tablet by mouth 2 (two) times daily.  Dispense: 14 tablet; Refill: 0 - fluconazole  (DIFLUCAN ) 150 MG tablet; Take 1 tablet (150 mg total) by mouth once for 1 dose.  Dispense: 1 tablet; Refill: 0 - Urine Culture    Return in about 3 months (around 12/15/2023) for PCP for chronic conditions.  The patient was given clear instructions to go to ER or return to medical center if symptoms don't improve, worsen or new problems develop. The patient verbalized understanding. The patient was told to call to get lab results if they haven't heard anything in the next week.      Dulce Gibbs, PA-C [facility] [place], [place] [phone]   09/15/2023, 11:23 AM

## 2023-09-15 NOTE — ED Notes (Signed)
 Pt has decided to leave for Doctors appointment

## 2023-09-16 ENCOUNTER — Other Ambulatory Visit (HOSPITAL_COMMUNITY): Payer: Self-pay

## 2023-09-16 ENCOUNTER — Other Ambulatory Visit: Payer: Self-pay

## 2023-09-16 ENCOUNTER — Encounter (HOSPITAL_COMMUNITY): Payer: Self-pay | Admitting: Emergency Medicine

## 2023-09-16 DIAGNOSIS — M79604 Pain in right leg: Secondary | ICD-10-CM | POA: Diagnosis not present

## 2023-09-16 MED ORDER — PREDNISONE 50 MG PO TABS
50.0000 mg | ORAL_TABLET | Freq: Every day | ORAL | 0 refills | Status: DC
Start: 2023-09-16 — End: 2023-09-16
  Filled 2023-09-16: qty 5, 5d supply, fill #0

## 2023-09-16 MED ORDER — PREDNISONE 5 MG PO TABS
50.0000 mg | ORAL_TABLET | Freq: Once | ORAL | Status: AC
  Administered 2023-09-16: 50 mg via ORAL
  Filled 2023-09-16: qty 2

## 2023-09-16 MED ORDER — HYDROCODONE-ACETAMINOPHEN 5-325 MG PO TABS
1.0000 | ORAL_TABLET | Freq: Once | ORAL | Status: AC
  Administered 2023-09-16: 1 via ORAL
  Filled 2023-09-16: qty 1

## 2023-09-16 MED ORDER — LIDOCAINE 5 % EX PTCH
1.0000 | MEDICATED_PATCH | CUTANEOUS | Status: DC
  Administered 2023-09-16: 1 via TRANSDERMAL
  Filled 2023-09-16: qty 1

## 2023-09-16 NOTE — ED Triage Notes (Signed)
 Pt reports she has had right sided Pusch back pain that goes down her leg  She states that the OTC medications are not working.  Pt denies any loss of bowl or bladder.

## 2023-09-16 NOTE — Discharge Instructions (Addendum)
 You are seen in the ER today for your low back pain.  You were prescribed a steroid which you should take for the entire course.  You were seen in the emergency department today for your back pain.  Your physical exam and vital signs are very reassuring.  The muscles in your low back are in what is called spasm, meaning they are inappropriately tightened up.  This can be quite painful.  To help with your pain you may take Tylenol  and / or NSAID medication (such as ibuprofen  or naproxen ) to help with your pain.  Additionally, you have been prescribed a muscle relaxer by your previous provider called Robaxin  to help relieve some of the muscle spasm.  Please be advised that this medication may make you very sleepy, so you should not drive or operate heavy machinery while you are taking it.  You may also utilize topical pain relief such as Biofreeze, IcyHot, or topical lidocaine  patches.  I also recommend that you apply heat to the area, such as a hot shower or heating pad, and follow heat application with massage of the muscles that are most tight.  Please return to the emergency department if you develop any numbness/tingling/weakness in your arms or legs, any difficulty urinating, or urinary incontinence chest pain, shortness of breath, abdominal pain, nausea or vomiting that does not stop, or any other new severe symptoms.

## 2023-09-17 NOTE — ED Provider Notes (Signed)
 [facility] Provider Note   CSN: [id number] Arrival date & time: 09/15/23  2304     History  Chief Complaint  Patient presents with   Back Pain    Sarah Charles is a 63 y.o. female who presents with concern for right-sided low back pain that shoots down her right leg with associated tingling. she has only used over-the-counter Tylenol .  Was previously seen at the [facility] earlier in the day for the same and was prescribed prednisone  taper and Robaxin  but has not picked up these medications.  Will also refer to physical therapy at that time.  No saddle anesthesia urinary fecal incontinence or retention.  No fevers recent instrumentation or immunocompromisation.  HPI     Home Medications Prior to Admission medications   Medication Sig Start Date End Date Taking? Authorizing Provider  Lancets [facility] DELICA PLUS [place]) MISC Use to check blood sugar 3 times daily. 08/23/23   Newlin, Enobong, MD  aspirin  EC 81 MG tablet Take 1 tablet (81 mg total) by mouth daily. Swallow whole. 08/25/23   Newlin, Enobong, MD  benzonatate  (TESSALON ) 200 MG capsule Take 1 capsule (200 mg total) by mouth 3 (three) times daily as needed for cough. 08/30/23   Piontek, Cleveland Dales, MD  Blood Glucose Monitoring Suppl (ACCU-CHEK GUIDE) w/Device KIT Use to check blood sugar 3 times daily. 08/25/23   Newlin, Enobong, MD  buPROPion  (WELLBUTRIN  SR) 150 MG 12 hr tablet Take 1 tablet (150 mg total) by mouth 2 (two) times daily. 07/21/23   Newlin, Enobong, MD  carvedilol  (COREG ) 3.125 MG tablet Take 1 tablet (3.125 mg total) by mouth 2 (two) times daily with a meal. 07/21/23   Joaquin Mulberry, MD  cetirizine  (ZYRTEC ) 10 MG tablet TAKE 1 TABLET BY MOUTH EVERY DAY Patient taking differently: Take 10 mg by mouth daily. 08/02/19   Newlin, Enobong, MD  clotrimazole  (LOTRIMIN ) 1 % cream APPLY TO THE AFFECTED AREA(S) beneath BOTH breasts TWICE DAILY Patient  taking differently: Apply 1 Application topically 2 (two) times daily. 03/08/22   Newlin, Enobong, MD  diclofenac  Sodium (VOLTAREN ) 1 % GEL Apply 4 g topically 4 (four) times daily. 05/05/21   Newlin, Enobong, MD  diphenhydrAMINE  (BENADRYL ) 25 MG tablet Take 1 tablet (25 mg total) by mouth every 6 (six) hours as needed. 05/01/23   Harlow Lighter, [place]  N, FNP  fenofibrate  (TRICOR ) 145 MG tablet Take 1 tablet (145 mg total) by mouth every evening. 07/21/23   Newlin, Enobong, MD  FISH OIL-CHOLECALCIFEROL PO Take by mouth.    [provider]  FLUoxetine  (PROZAC ) 20 MG capsule Take 1 capsule (20 mg total) by mouth every morning. 07/21/23   Newlin, Enobong, MD  furosemide  (LASIX ) 20 MG tablet Take 1 tablet (20 mg total) by mouth daily. 07/21/23   Newlin, Enobong, MD  gabapentin  (NEURONTIN ) 300 MG capsule Take 2 capsules (600 mg total) by mouth 2 (two) times daily. 07/21/23   Newlin, Enobong, MD  glucose blood (ACCU-CHEK GUIDE TEST) test strip Use to check blood sugar 3 times daily. 08/23/23   Newlin, Enobong, MD  insulin  glargine (LANTUS  SOLOSTAR) 100 UNIT/ML Solostar Pen Inject 33 Units into the skin 2 (two) times daily. 07/21/23   Newlin, Enobong, MD  Insulin  Pen Needle (COMFORT EZ PEN NEEDLES) 31G X 5 MM MISC Inject 1 each into the skin in the morning and at bedtime. use as directed 07/21/23   Newlin, Enobong, MD  Lancets Misc. (ACCU-CHEK  FASTCLIX LANCET) KIT USE AS DIRECTED 11/12/21   Newlin, Enobong, MD  lidocaine  (LIDODERM ) 5 % Place ONE PATCH onto THE SKIN daily. REMOVE & Discard PATCH WITHIN 12 hours OR as directed by MD Patient taking differently: Place 1 patch onto the skin every 12 (twelve) hours. 12/30/22   Newlin, Enobong, MD  lisinopril  (ZESTRIL ) 40 MG tablet Take 1 tablet (40 mg total) by mouth daily. 07/21/23   Newlin, Enobong, MD  metFORMIN  (GLUCOPHAGE ) 500 MG tablet Take 2 tablets (1,000 mg total) by mouth 2 (two) times daily. 07/21/23   Newlin, Enobong, MD  methocarbamol  (ROBAXIN ) 750 MG tablet  Take 1 tablet (750 mg total) by mouth every 8 (eight) hours as needed for muscle spasms. 09/15/23   Hassie Lint, PA-C  metoCLOPramide  (REGLAN ) 10 MG tablet Take 1 tablet (10 mg total) by mouth every 8 (eight) hours as needed for nausea and vomiting 07/21/23   Newlin, Enobong, MD  mupirocin  ointment (BACTROBAN ) 2 % Apply 1 Application topically 2 (two) times daily. 05/01/23   Harlow Lighter, [place]  N, FNP  nitroGLYCERIN  (NITROSTAT ) 0.4 MG SL tablet Place 0.4 mg under the tongue every 5 (five) minutes as needed for chest pain. Patient not taking: Reported on 07/21/2023    [provider]  oxyCODONE  (OXY IR/ROXICODONE ) 5 MG immediate release tablet Take 5 mg by mouth every 4 (four) hours as needed for moderate pain. 11/29/22   [provider]  pantoprazole  (PROTONIX ) 40 MG tablet Take 1 tablet (40 mg total) by mouth 2 (two) times daily. 07/21/23   Newlin, Enobong, MD  rosuvastatin  (CRESTOR ) 40 MG tablet Take 1 tablet (40 mg total) by mouth every evening. 07/21/23   Newlin, Enobong, MD  sucralfate  (CARAFATE ) 1 g tablet Take 1 g by mouth 4 (four) times daily -  with meals and at bedtime.    [provider]  sulfamethoxazole -trimethoprim  (BACTRIM  DS) 800-160 MG tablet Take 1 tablet by mouth 2 (two) times daily. 09/15/23   Hassie Lint, PA-C  traZODone  (DESYREL ) 50 MG tablet Take 1 tablet (50 mg total) by mouth at bedtime. 07/21/23   Newlin, Enobong, MD  triamcinolone  cream (KENALOG ) 0.1 % Apply 1 Application topically 2 (two) times daily. Patient not taking: Reported on 09/15/2023 07/21/23   Newlin, Enobong, MD      Allergies    Cymbalta [duloxetine hcl]    Review of Systems   Review of Systems  Genitourinary: Negative.   Musculoskeletal:  Positive for back pain.  Neurological: Negative.     Physical Exam Updated Vital Signs BP (!) 176/77 (BP Location: Right Arm)   Pulse 72   Temp 98.1 F (36.7 C)   Resp 18   Ht 5\' 2"  (1.575 m)   Wt 89.8 kg   SpO2 95%   BMI 36.21  kg/m  Physical Exam Vitals and nursing note reviewed.  Constitutional:      Appearance: She is obese. She is not ill-appearing or toxic-appearing.  HENT:     Head: Normocephalic and atraumatic.     Mouth/Throat:     Mouth: Mucous membranes are moist.     Pharynx: No oropharyngeal exudate or posterior oropharyngeal erythema.  Eyes:     General:        Right eye: No discharge.        Left eye: No discharge.     Conjunctiva/sclera: Conjunctivae normal.  Cardiovascular:     Rate and Rhythm: Normal rate and regular rhythm.     Pulses: Normal pulses.  Heart sounds: No murmur heard. Pulmonary:     Effort: Pulmonary effort is normal. No respiratory distress.     Breath sounds: Normal breath sounds. No wheezing or rales.  Abdominal:     General: Bowel sounds are normal. There is no distension.     Palpations: Abdomen is soft.     Tenderness: There is no abdominal tenderness.  Musculoskeletal:        General: No deformity.     Cervical back: Normal and neck supple.     Thoracic back: Normal.     Lumbar back: Tenderness present. No bony tenderness. Positive right straight leg raise test. Negative left straight leg raise test.  Skin:    General: Skin is warm and dry.  Neurological:     Mental Status: She is alert. Mental status is at baseline.     GCS: GCS eye subscore is 4. GCS verbal subscore is 5. GCS motor subscore is 6.     Sensory: Sensation is intact.     Motor: Motor function is intact.     Coordination: Coordination is intact.     Gait: Gait is intact.  Psychiatric:        Mood and Affect: Mood normal.     ED Results / Procedures / Treatments   Labs (all labs ordered are listed, but only abnormal results are displayed) Labs Reviewed - No data to display  EKG None  Radiology No results found.  Procedures Procedures    Medications Ordered in ED Medications  HYDROcodone -acetaminophen  (NORCO/VICODIN) 5-325 MG per tablet 1 tablet (1 tablet Oral Given 09/16/23  0602)  predniSONE  (DELTASONE ) tablet 50 mg (50 mg Oral Given 09/16/23 0602)    ED Course/ Medical Decision Making/ A&P                                 Medical Decision Making 63 year old female with previous diagnosis of sciatica who presents today with concern for persistent right back pain, has not picked up prescriptions from her visit earlier in the day.  Hypertensive on intake and vitals otherwise normal.  Cardiopulmonary dam unremarkable,benign per patient is neurologically intact.  No midline tenderness palpation of the C, T, L-spine's.  Symmetric strength and sensation lower EXTR with bilaterally, patient ambulatory in the ED.  The emergent differential diagnosis for low back pain includes acute ligamentous and muscular injury, cord compression syndrome, pathologic fracture, transverse myelitis, vertebral osteomyelitis, discitis, and epidural abscess.   Risk Prescription drug management.   Clinical picture most consistent with lumbar radiculopathy versus sciatica.  Lidoderm  patch offered but patient is already received prescriptions for prednisone  taper and muscle relaxer in the outpatient setting.  Clinical concern for emergent underlying condition such as cauda equina syndrome, more for the workup and patient management is exceedingly low.  No evidence to suggest infectious etiology of the patient's symptoms either.  Recommend close outpatient follow-up with PT referral as previously discussed.  Lower  voiced understanding of her medical evaluation and treatment plan. Each of their questions answered to their expressed satisfaction.  Return precautions were given.  Patient is well-appearing, stable, and was discharged in good condition.  This chart was dictated using voice recognition software, Dragon. Despite the best efforts of this provider to proofread and correct errors, errors may still occur which can change documentation meaning.         Final Clinical Impression(s)  / ED Diagnoses Final diagnoses:  Lumbar radiculopathy  Rx / DC Orders ED Discharge Orders          Ordered    predniSONE  (DELTASONE ) 50 MG tablet  Daily with breakfast,   Status:  Discontinued        09/16/23 0606              Angelise Petrich, Adelle Agent, PA-C 09/17/23 0720    Lindle Rhea, MD 09/17/23 [phone]

## 2023-09-19 LAB — URINE CULTURE

## 2023-09-20 ENCOUNTER — Ambulatory Visit: Payer: 59 | Admitting: Family Medicine

## 2023-09-27 ENCOUNTER — Telehealth: Payer: Self-pay | Admitting: Family Medicine

## 2023-09-27 NOTE — Telephone Encounter (Signed)
 Copied from CRM [id number]. Topic: General - Other  >> Sep 27, 2023 12:10 PM Rosamond Comes wrote: Reason for CRM: [facility] with [employer] [phone] requesting the signed prescription form for 06/22/23  provider  Dulce Gibbs signature on the form The form will be re-faxed 09/27/23  Blood Glucose Monitoring Suppl (ACCU-CHEK GUIDE) w/Device KIT  Sarah Charles will be contacting the office again  on 09/28/23 for a follow up call

## 2023-09-28 ENCOUNTER — Telehealth: Payer: Self-pay | Admitting: Family Medicine

## 2023-09-28 ENCOUNTER — Ambulatory Visit: Admitting: Family Medicine

## 2023-09-28 ENCOUNTER — Other Ambulatory Visit: Payer: Self-pay | Admitting: Physician Assistant

## 2023-09-28 DIAGNOSIS — E041 Nontoxic single thyroid nodule: Secondary | ICD-10-CM

## 2023-09-28 NOTE — Telephone Encounter (Addendum)
 Patient's landlord Nada Auer dropped off application for disability parking placard for Dr. Newlin to review and sign. Form placed in Dr. Jennet Mode mailbox. 09/28/2023.

## 2023-09-29 ENCOUNTER — Telehealth: Payer: Self-pay

## 2023-09-29 NOTE — Telephone Encounter (Signed)
 Form has been faxed.

## 2023-09-29 NOTE — Telephone Encounter (Signed)
Form has been received and patient will be called once ready for pick up. ?

## 2023-09-29 NOTE — Telephone Encounter (Signed)
 Copied from CRM [id number]. Topic: Clinical - Prescription Issue >> Sep 29, 2023  5:45 PM Stanly Early wrote: Reason for CRM: Caller is following up again about having that Rx corrected signed and date by the provider Jonelle Neri, Shelvy Dickens) for Blood Glucose Monitoring Suppl (ACCU-CHEK GUIDE) w/Device KIT  Fax#[phone] Someone from [employer] will call again tomorrow to follow up

## 2023-09-30 ENCOUNTER — Telehealth: Payer: Self-pay | Admitting: *Deleted

## 2023-09-30 NOTE — Progress Notes (Unsigned)
 Complex Care Management Care Guide Note  09/30/2023 Name: Sarah Charles MRN: [id number] DOB: [birth year]  Sarah Charles is a 63 y.o. year old female who is a primary care patient of Newlin, Enobong, MD and is actively engaged with the care management team. I reached out to Red Canard by phone today to assist with re-scheduling  with the RN Case Manager.  Follow up plan: Unsuccessful telephone outreach attempt made. A HIPAA compliant phone message was left for the patient providing contact information and requesting a return call.  Barnie Bora  [facility]  [facility], [facility] Guide  Direct Dial: [phone]  Fax [phone]

## 2023-09-30 NOTE — Telephone Encounter (Signed)
 Form has been faxed.

## 2023-10-03 ENCOUNTER — Ambulatory Visit
Admission: RE | Admit: 2023-10-03 | Discharge: 2023-10-03 | Disposition: A | Discharge status: Home / Self Care | Source: Ambulatory Visit | Attending: Physician Assistant | Admitting: Physician Assistant

## 2023-10-03 ENCOUNTER — Other Ambulatory Visit: Payer: Self-pay | Admitting: Pharmacist

## 2023-10-03 ENCOUNTER — Telehealth: Payer: Self-pay | Admitting: Family Medicine

## 2023-10-03 DIAGNOSIS — E041 Nontoxic single thyroid nodule: Secondary | ICD-10-CM

## 2023-10-03 DIAGNOSIS — E1165 Type 2 diabetes mellitus with hyperglycemia: Secondary | ICD-10-CM

## 2023-10-03 MED ORDER — ACCU-CHEK GUIDE W/DEVICE KIT: PACK | 0 refills | Status: DC

## 2023-10-03 NOTE — Telephone Encounter (Signed)
 Rxn sent for Accu Chek with dx code.

## 2023-10-03 NOTE — Telephone Encounter (Signed)
 Copied from CRM [id number]. Topic: Clinical - Prescription Issue >> Oct 03, 2023  2:43 PM Sarah Charles wrote: Reason for CRM: speciality monitor equipment is calling saying that rx for Blood Glucose Monitoring Suppl (ACCU-CHEK GUIDE) w/Device KIT needs to have a correct date and providers initial please [phone]

## 2023-10-04 ENCOUNTER — Ambulatory Visit: Payer: Self-pay

## 2023-10-04 NOTE — Progress Notes (Signed)
 Complex Care Management Care Guide Note  10/04/2023 Name: Sarah Charles MRN: [id number] DOB: 28-Sep-1960  Sarah Charles is a 63 y.o. year old female who is a primary care patient of Newlin, Enobong, MD and is actively engaged with the care management team. I reached out to Red Canard by phone today to assist with re-scheduling  with the RN Case Manager.  Follow up plan: Telephone appointment with complex care management team member scheduled for:  5/29  Barnie Bora  [facility]  [facility], [facility] Guide  Direct Dial: [phone]  Fax [phone]

## 2023-10-04 NOTE — Telephone Encounter (Signed)
 Chief Complaint: chronic back pain Symptoms: back pain and leg pain Frequency: over a year Pertinent Negatives: Patient denies injury Disposition: [] ED /[x] Urgent Care (no appt availability in office) / [] Appointment(In office/virtual)/ []  [facility]/ [] Home Care/ [] Refused Recommended Disposition /[] [facility]/ [x]  Follow-up with PCP Additional Notes: pt states pain in her lower back and radiates own her legs. States that this has been and ongoing situation for a bout a year. States when having the pain its a 10/10. Instructed pt to UC for immediate assistance as no appt available pt declined. Pt is wanting an appt to speak with Dr. Newlin for a referral out for her back pain.   Copied from CRM [id number]. Topic: Clinical - Red Word Triage >> Oct 04, 2023  9:22 AM Sarah Charles wrote: Red Word that prompted transfer to Nurse Triage: Patient having pain in her leg and her back and it is constant. She states it takes her breath, she states she is having trouble walking and has tocatch herself often. Reason for Disposition  Back pain is a chronic symptom (recurrent or ongoing AND present > 4 weeks)  Answer Assessment - Initial Assessment Questions 1. ONSET: "When did the pain begin?"      A year ago 2. LOCATION: "Where does it hurt?" (upper, mid or lower back)     Lower back 3. SEVERITY: "How bad is the pain?"  (e.g., Scale 1-10; mild, moderate, or severe)   - MILD (1-3): Doesn't interfere with normal activities.    - MODERATE (4-7): Interferes with normal activities or awakens from sleep.    - SEVERE (8-10): Excruciating pain, unable to do any normal activities.      10/10 4. PATTERN: "Is the pain constant?" (e.g., yes, no; constant, intermittent)      Constants 5. RADIATION: "Does the pain shoot into your legs or somewhere else?"     Down both legs 6. CAUSE:  "What do you think is causing the back pain?"      unknown 7. BACK OVERUSE:  "Any recent lifting of heavy  objects, strenuous work or exercise?"     Over use and falls long ago 8. MEDICINES: "What have you taken so far for the pain?" (e.g., nothing, acetaminophen , NSAIDS)     Muscle relaxer 9. NEUROLOGIC SYMPTOMS: "Do you have any weakness, numbness, or problems with bowel/bladder control?"     no 10. OTHER SYMPTOMS: "Do you have any other symptoms?" (e.g., fever, abdomen pain, burning with urination, blood in urine)       no  Protocols used: Back Pain-A-AH

## 2023-10-05 ENCOUNTER — Ambulatory Visit: Admitting: Family Medicine

## 2023-10-06 ENCOUNTER — Telehealth: Payer: Self-pay

## 2023-10-06 NOTE — Telephone Encounter (Signed)
 Form has been completed and faxed.   Copied from CRM [id number]. Topic: Clinical - Order For Equipment >> Oct 06, 2023  2:25 PM Stanly Early wrote: Reason for CRM: Adine Ahmadi called from [facility] about freestyle libre 3 sensor and formed filled out with provider signature then faxed over to [phone]

## 2023-10-10 ENCOUNTER — Other Ambulatory Visit (HOSPITAL_COMMUNITY): Payer: Self-pay

## 2023-10-10 NOTE — Telephone Encounter (Signed)
 They need doctor's initials next to the corrected date on form. Please advise   Best contact: [phone]  Requesting this fax back urgently

## 2023-10-10 NOTE — Telephone Encounter (Signed)
 Call placed to specialty [facility] and informed that Dulce Gibbs is not the patient's PCP and will not sign off on forms. I informed her that patient will be called and scheduled with PCP for office notes and form will be faxed once complete.  They will be sending another form to be completed.

## 2023-10-12 ENCOUNTER — Other Ambulatory Visit (HOSPITAL_COMMUNITY): Payer: Self-pay

## 2023-10-12 ENCOUNTER — Other Ambulatory Visit: Payer: Self-pay | Admitting: Physician Assistant

## 2023-10-12 DIAGNOSIS — G8929 Other chronic pain: Secondary | ICD-10-CM

## 2023-10-12 DIAGNOSIS — M5431 Sciatica, right side: Secondary | ICD-10-CM

## 2023-10-17 NOTE — Progress Notes (Deleted)
   Acute Office Visit  Subjective:     Patient ID: Sarah Charles, female    DOB: 17-Feb-1961, 63 y.o.   MRN: [id number]  No chief complaint on file.   HPI Patient is in today for back and leg pain  ROS      Objective:    There were no vitals taken for this visit. {Vitals History (Optional):23777}  Physical Exam  No results found for any visits on 10/20/23.      Assessment & Plan:   Problem List Items Addressed This Visit   None   No orders of the defined types were placed in this encounter.   No follow-ups on file.  Arlene Lacy, MD

## 2023-10-20 ENCOUNTER — Other Ambulatory Visit: Payer: Self-pay

## 2023-10-20 ENCOUNTER — Telehealth: Payer: Self-pay

## 2023-10-20 ENCOUNTER — Ambulatory Visit: Admitting: Critical Care Medicine

## 2023-10-20 NOTE — Telephone Encounter (Signed)
 Patient needs recent office notes from her PCP her last visit was with Dulce Gibbs and she can not sign off on forms. Patient has office visit today but she canceled the appointment. She has an appointment next month with her PCP and office notes will be sent then,      Copied from CRM [id number]. Topic: Medical Record Request - Records Request >> Oct 20, 2023  8:35 AM Ivette P wrote: Reason for CRM: Pt called in and said that [facility] is requesting the most recent office notes of the most recent visit.   Pt would like for someone to call to follow up   PT callback [phone]

## 2023-10-20 NOTE — Patient Instructions (Signed)
 Visit Information  Thank you for taking time to visit with me today. Please don't hesitate to contact me if I can be of assistance to you before our next scheduled appointment.  Our next appointment is by telephone on 11/03/23 at 1:00pm Please call the care guide team at [phone] if you need to cancel or reschedule your appointment.   Following is a copy of your care plan:   Goals Addressed             This Visit's Progress    VBCI RN Care Plan       Problems:  Chronic Disease Management support and education needs related to DMII  Goal: Over the next 3 months the Patient will demonstrate a decrease DMII in exacerbations as evidenced by decreased A1C <7.   Interventions:   Diabetes Interventions: Assessed patient's understanding of A1c goal: <7% Reviewed medications with patient and discussed importance of medication adherence Reviewed scheduled/upcoming provider appointments including: PCP for July  Advised patient, providing education and rationale, to check cbg as ordered, at least once a day fasting in the mornings and record, calling PCP for findings outside established parameters Screening for signs and symptoms of depression related to chronic disease state  Assessed social determinant of health barriers Lab Results  Component Value Date   HGBA1C 8.5 (A) 09/15/2023    Patient Self-Care Activities:  Attend all scheduled provider appointments Call pharmacy for medication refills 3-7 days in advance of running out of medications Call provider office for new concerns or questions  Take medications as prescribed    Plan:  Telephone follow up appointment with care management team member scheduled for:  11/03/23 at 1:00pm           VBCI RN Care Plan       Problems:  Knowledge Deficits related to back pain  Goal: Over the next 30 days the Patient will verbalize understanding of plan for management of Back Pain as evidenced by be able to rate back pain of 4 and  below most of the time during a 24 hours period  Interventions:   Pain Interventions: Pain assessment performed Medications reviewed Reviewed provider established plan for pain management Counseled on the importance of reporting any/all new or changed pain symptoms or management strategies to pain management provider Discussed use of relaxation techniques and/or diversional activities to assist with pain reduction (distraction, imagery, relaxation, massage, acupressure, TENS, heat, and cold application Reviewed with patient prescribed pharmacological and nonpharmacological pain relief strategies Advised patient to discuss ongoing and worsening back/leg pain with provider  Patient Self-Care Activities:  Call pharmacy for medication refills 3-7 days in advance of running out of medications Take medications as prescribed   Use ice/heat for 20 minutes at a time to painful areas of back/leg, several times a day.   Plan:  Telephone follow up appointment with care management team member scheduled for:  11/03/23 at 1:00pm             Please call the [place]  National Suicide Prevention Lifeline: 424-206-6635 or TTY: (443)175-4619 TTY 403-424-5907) to talk to a trained counselor if you are experiencing a Mental Health or Behavioral Health Crisis or need someone to talk to.  The patient verbalized understanding of instructions, educational materials, and care plan provided today and agreed to receive a mailed copy of patient instructions, educational materials, and care plan.   Jurline Olmsted BSN, CCM [place]  VBCI Population Health RN Care Manager Direct Dial: [phone]  Fax: [phone]
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
## 2023-10-20 NOTE — Telephone Encounter (Signed)
 Copied from CRM [id number]. Topic: General - Other >> Oct 20, 2023  8:05 AM Marissa P wrote: Reason for CRM: Patient cancelled their 09:30am appt this morning, Cancelled wont be able to make it. Just wanted to inform office since it is coming up. Wsnt feeling feel all night and would like to get some sleep

## 2023-10-24 ENCOUNTER — Other Ambulatory Visit: Payer: Self-pay | Admitting: Family Medicine

## 2023-10-24 ENCOUNTER — Other Ambulatory Visit (HOSPITAL_COMMUNITY): Payer: Self-pay

## 2023-10-24 ENCOUNTER — Other Ambulatory Visit: Payer: Self-pay

## 2023-10-24 DIAGNOSIS — N3001 Acute cystitis with hematuria: Secondary | ICD-10-CM

## 2023-10-24 NOTE — Telephone Encounter (Signed)
 Copied from CRM [id number]. Topic: Clinical - Medication Refill >> Oct 24, 2023 12:12 PM Fonda T wrote: Medication: sucralfate  (CARAFATE ) 1 g  Has the patient contacted their pharmacy? Yes (Agent: If no, request that the patient contact the pharmacy for the refill. If patient does not wish to contact the pharmacy document the reason why and proceed with request.) (Agent: If yes, when and what did the pharmacy advise?)  [place] [facility] [address] [place] [address] Phone: [phone] Fax: [phone]   Is this the correct pharmacy for this prescription? Yes If no, delete pharmacy and type the correct one.   Has the prescription been filled recently? Yes  Is the patient out of the medication? No, has 2 pills left  Has the patient been seen for an appointment in the last year OR does the patient have an upcoming appointment? Yes  Can we respond through MyChart? No  Agent: Please be advised that Rx refills may take up to 3 business days. We ask that you follow-up with your pharmacy.

## 2023-10-25 ENCOUNTER — Other Ambulatory Visit: Payer: Self-pay | Admitting: Physician Assistant

## 2023-10-25 ENCOUNTER — Ambulatory Visit: Payer: Self-pay | Admitting: Physician Assistant

## 2023-10-25 DIAGNOSIS — E041 Nontoxic single thyroid nodule: Secondary | ICD-10-CM

## 2023-10-25 NOTE — Telephone Encounter (Signed)
 Requested medication (s) are due for refill today: No  Requested medication (s) are on the active medication list: Yes  Last refill:  09/15/23  Future visit scheduled: Yes  Notes to clinic:  Not delegated.    Requested Prescriptions  Pending Prescriptions Disp Refills   sulfamethoxazole -trimethoprim  (BACTRIM  DS) 800-160 MG tablet 14 tablet 0    Sig: Take 1 tablet by mouth 2 (two) times daily.     Off-Protocol Failed - 10/25/2023  1:28 PM      Failed - Medication not assigned to a protocol, review manually.      Passed - Valid encounter within last 12 months    Recent Outpatient Visits           1 month ago Type 2 diabetes mellitus with hyperglycemia, with long-term current use of insulin  (HCC)   [facility] [place]. [facility], Shelvy Dickens M, [place]   4 months ago Type 2 diabetes mellitus with other specified complication, with long-term current use of insulin  [facility])   [facility] [place]. [facility], Shelvy Dickens M, [place]   5 months ago Type 2 diabetes mellitus with other specified complication, with long-term current use of insulin  [facility])   [facility] [place]. [facility], Shelvy Dickens M, [place]   8 months ago Hypertension associated with diabetes [facility])   [facility] [place]. [facility] Joaquin Mulberry, MD   11 months ago Type 2 diabetes mellitus with other specified complication, with long-term current use of insulin  [facility])   [facility] Of . [facility] Joaquin Mulberry, MD

## 2023-10-28 ENCOUNTER — Telehealth: Payer: Self-pay

## 2023-10-28 ENCOUNTER — Other Ambulatory Visit (HOSPITAL_COMMUNITY): Payer: Self-pay

## 2023-10-28 NOTE — Telephone Encounter (Signed)
 Patient called, left VM to return the call to the office.   Copied from CRM [id number]. Topic: Clinical - Medication Question >> Oct 28, 2023 11:42 AM Baldemar Lev wrote: Reason for CRM: Wants to speak to a nurse about her medication Hydroxyzine  HCL 25 MG  Best contact: [phone]

## 2023-11-01 ENCOUNTER — Ambulatory Visit: Payer: Self-pay

## 2023-11-01 ENCOUNTER — Other Ambulatory Visit (HOSPITAL_COMMUNITY): Payer: Self-pay

## 2023-11-01 DIAGNOSIS — E1142 Type 2 diabetes mellitus with diabetic polyneuropathy: Secondary | ICD-10-CM

## 2023-11-01 NOTE — Telephone Encounter (Signed)
 Patient requesting increase in gabapentin  medication.

## 2023-11-01 NOTE — Telephone Encounter (Signed)
 FYI Only or Action Required?: Action required by provider  Patient was last seen in primary care on 09/15/2023 by Hassie Lint, PA-C. Called Nurse Triage reporting Leg Pain. Symptoms began several years ago. Interventions attempted: Prescription medications: gabapentin  and Rest, hydration, or home remedies. Symptoms are: gradually worsening.  Triage Disposition: See PCP Within 2 Weeks  Patient/caregiver understands and will follow disposition?: Unsure          Copied from CRM [id number]. Topic: Clinical - Red Word Triage >> Nov 01, 2023 11:59 AM Turkey B wrote: [facility] that prompted transfer to Nurse Triage: pt has severe pain in both legs Reason for Disposition  Leg pain or muscle cramp is a chronic symptom (recurrent or ongoing AND present > 4 weeks)  Answer Assessment - Initial Assessment Questions No available until mid-July. Please follow-up w/ patient for an office visit.    1. ONSET: "When did the pain start?"      "I've had it a while, I can't even walk" (not new) 2. LOCATION: "Where is the pain located?"      Bilateral legs 3. PAIN: "How bad is the pain?"    (Scale 1-10; or mild, moderate, severe)   -  MILD (1-3): doesn't interfere with normal activities    -  MODERATE (4-7): interferes with normal activities (e.g., work or school) or awakens from sleep, limping    -  SEVERE (8-10): excruciating pain, unable to do any normal activities, unable to walk     5/10 - constant 4. WORK OR EXERCISE: "Has there been any recent work or exercise that involved this part of the body?"      No  5. CAUSE: "What do you think is causing the leg pain?"     Not sure  6. OTHER SYMPTOMS: "Do you have any other symptoms?" (e.g., chest pain, back pain, breathing difficulty, swelling, rash, fever, numbness, weakness)      "I took all my gabapentin  and I called the pharmacy and they told me to call you", denies that it has been helpful. Pt states she has been taking 2 capsules 3x a day,  more than is prescribed  Endorses leg swelling - "I carry fluid where my socks are, it leaves a mark", always been swollen, taking fluid pills - bilateral swelling is even  Denies CP, SOB, rashes  Endorses "a little tingling"  Protocols used: Leg Pain-A-AH

## 2023-11-02 ENCOUNTER — Other Ambulatory Visit: Payer: Self-pay

## 2023-11-02 ENCOUNTER — Other Ambulatory Visit (HOSPITAL_COMMUNITY): Payer: Self-pay

## 2023-11-02 ENCOUNTER — Other Ambulatory Visit: Payer: Self-pay | Admitting: Family Medicine

## 2023-11-02 DIAGNOSIS — E1142 Type 2 diabetes mellitus with diabetic polyneuropathy: Secondary | ICD-10-CM

## 2023-11-02 MED ORDER — GABAPENTIN 300 MG PO CAPS
600.0000 mg | ORAL_CAPSULE | Freq: Three times a day (TID) | ORAL | 3 refills | Status: DC
  Filled 2023-11-02: qty 180, 30d supply, fill #0
  Filled 2023-12-21: qty 180, 30d supply, fill #1

## 2023-11-02 NOTE — Telephone Encounter (Signed)
 Copied from CRM [id number]. Topic: General - Call Back - No Documentation >> Nov 02, 2023 11:57 AM Baldomero Bone wrote:  Reason for CRM: Patient is returning a call from Alycia. Callback number is [phone]

## 2023-11-02 NOTE — Telephone Encounter (Signed)
 Patient has been informed.

## 2023-11-02 NOTE — Telephone Encounter (Signed)
 Dose of gabapentin  has been increased and sent to her pharmacy.

## 2023-11-03 ENCOUNTER — Other Ambulatory Visit: Payer: Self-pay

## 2023-11-04 ENCOUNTER — Ambulatory Visit
Admission: RE | Admit: 2023-11-04 | Discharge: 2023-11-04 | Disposition: A | Discharge status: Home / Self Care | Source: Ambulatory Visit | Attending: Physician Assistant | Admitting: Physician Assistant

## 2023-11-04 ENCOUNTER — Other Ambulatory Visit (HOSPITAL_COMMUNITY)
Admission: RE | Admit: 2023-11-04 | Discharge: 2023-11-04 | Disposition: A | Discharge status: Home / Self Care | Source: Ambulatory Visit | Attending: Physician Assistant | Admitting: Physician Assistant

## 2023-11-04 ENCOUNTER — Telehealth: Payer: Self-pay

## 2023-11-04 DIAGNOSIS — E041 Nontoxic single thyroid nodule: Secondary | ICD-10-CM | POA: Diagnosis not present

## 2023-11-04 NOTE — Patient Outreach (Signed)
 Complex Care Management   Visit Note  11/04/2023  Name:  Sarah Charles MRN: [id number] DOB: [birth year]  Situation: Referral received for Complex Care Management related to Diabetes, Hypertension, back pain. I obtained verbal consent from Patient.  Visit completed with Sarah Charles  on the phone. Her main concern today is controlling back pain.  She called PCP office on 11/01/23 to report pain, PCP increased gabapentin  to 300mg  2 caps three times a day.   Background:   Past Medical History:  Diagnosis Date   Arthritis    Coronary artery calcification seen on CT scan    a. 07/2017 noted on CT abd.   Depression    GERD (gastroesophageal reflux disease)    Hyperlipidemia    Hypertension    Obesity    Sleep apnea    a. Does not use CPAP cause i dont think I need it anymore.   Tobacco abuse    Type II diabetes mellitus (HCC)     Assessment: Patient Reported Symptoms:  Cognitive Cognitive Status: Alert and oriented to person, place, and time, Insightful and able to interpret abstract concepts, Normal speech and language skills      Neurological Neurological Review of Symptoms: No symptoms reported    HEENT HEENT Symptoms Reported: Not assessed      Cardiovascular Cardiovascular Symptoms Reported: Swelling in legs or feet (Denies pitting edema, denies weeping.) Cardiovascular Conditions: Hypertension, High blood cholesterol  Respiratory Respiratory Symptoms Reported: No symptoms reported    Endocrine Patient reports the following symptoms related to hypoglycemia or hyperglycemia : No symptoms reported    Gastrointestinal Gastrointestinal Symptoms Reported: No symptoms reported      Genitourinary Genitourinary Symptoms Reported: No symptoms reported    Integumentary Integumentary Symptoms Reported: No symptoms reported    Musculoskeletal Musculoskelatal Symptoms Reviewed: Other Other Musculoskeletal Symptoms: Reports improved leg pain, mostly back pain today, rates pain 6/10  in back.  Reminded patient PCP increased Gabapentin  to 300mg  2 caps three times a day for better pain managment, she will take first dose today after she eats.        Psychosocial Psychosocial Symptoms Reported: Not assessed            10/20/2023    1:23 PM  Depression screen PHQ 2/9  Decreased Interest 0  Down, Depressed, Hopeless 0  PHQ - 2 Score 0    There were no vitals filed for this visit.  Medications Reviewed Today   Medications were not reviewed in this encounter     Recommendation:   Continue Current Plan of Care Sarah Charles will take gabapentin  300mg  2 caps three times a day as prescribed.  States pain has improved from when she and I spoke last, leg pain has improved.  Discussed using heat/ice on painful areas as well.   She will report worsening pain  Follow Up Plan:   Telephone follow-up two weeks.  Jurline Olmsted BSN, CCM [place]  [facility] Population Health RN Care Manager Direct Dial: [phone]  Fax: [phone]

## 2023-11-04 NOTE — Patient Instructions (Signed)
 Visit Information  Thank you for taking time to visit with me today. Please don't hesitate to contact me if I can be of assistance to you before our next scheduled appointment.  Your next care management appointment is by telephone on Thursday, June 26 at 1:00pm  Please call the care guide team at [phone] if you need to cancel, schedule, or reschedule an appointment.   A reminder to ALL patients/family/friends, please call the [place]  National Suicide Prevention Lifeline: 339-419-2985 or TTY: (660)447-4530 TTY 4135571200) to talk to a trained counselor if you are experiencing a Mental Health or Behavioral Health Crisis or need someone to talk to.  Jurline Olmsted BSN, CCM [place]  VBC[employer] Population Health RN Care Manager Direct Dial: [phone]  Fax: [phone]

## 2023-11-04 NOTE — Telephone Encounter (Signed)
 Patient was called and informed that form is at front office for pick up.      Copied from CRM [id number]. Topic: General - Other >> Nov 04, 2023  8:46 AM Rosamond Comes wrote: Reason for CRM: patient calling asking if Parking placard has been approved .  Please call patient [phone] when it 's ready to be picked up

## 2023-11-08 ENCOUNTER — Ambulatory Visit: Payer: Self-pay | Admitting: Physician Assistant

## 2023-11-08 LAB — CYTOLOGY - NON PAP

## 2023-11-12 ENCOUNTER — Encounter (HOSPITAL_COMMUNITY): Payer: Self-pay

## 2023-11-12 ENCOUNTER — Other Ambulatory Visit: Payer: Self-pay

## 2023-11-12 ENCOUNTER — Ambulatory Visit (HOSPITAL_COMMUNITY)
Admission: RE | Admit: 2023-11-12 | Discharge: 2023-11-12 | Disposition: A | Discharge status: Home / Self Care | Source: Ambulatory Visit | Attending: Emergency Medicine | Admitting: Emergency Medicine

## 2023-11-12 VITALS — BP 148/83 | HR 81 | Temp 98.1°F | Resp 20

## 2023-11-12 DIAGNOSIS — R3589 Other polyuria: Secondary | ICD-10-CM | POA: Diagnosis not present

## 2023-11-12 DIAGNOSIS — E119 Type 2 diabetes mellitus without complications: Secondary | ICD-10-CM | POA: Diagnosis not present

## 2023-11-12 DIAGNOSIS — Z794 Long term (current) use of insulin: Secondary | ICD-10-CM | POA: Diagnosis not present

## 2023-11-12 DIAGNOSIS — B372 Candidiasis of skin and nail: Secondary | ICD-10-CM | POA: Diagnosis not present

## 2023-11-12 DIAGNOSIS — R300 Dysuria: Secondary | ICD-10-CM | POA: Diagnosis not present

## 2023-11-12 DIAGNOSIS — E1165 Type 2 diabetes mellitus with hyperglycemia: Secondary | ICD-10-CM

## 2023-11-12 DIAGNOSIS — R739 Hyperglycemia, unspecified: Secondary | ICD-10-CM | POA: Insufficient documentation

## 2023-11-12 LAB — COMPREHENSIVE METABOLIC PANEL WITH GFR
ALT: 15 U/L (ref 0–44)
AST: 15 U/L (ref 15–41)
Albumin: 3.3 g/dL — ABNORMAL LOW (ref 3.5–5.0)
Alkaline Phosphatase: 76 U/L (ref 38–126)
Anion gap: 10 (ref 5–15)
BUN: 15 mg/dL (ref 8–23)
CO2: 25 mmol/L (ref 22–32)
Calcium: 9.5 mg/dL (ref 8.9–10.3)
Chloride: 100 mmol/L (ref 98–111)
Creatinine, Ser: 1.06 mg/dL — ABNORMAL HIGH (ref 0.44–1.00)
GFR, Estimated: 59 mL/min — ABNORMAL LOW (ref >60)
Glucose, Bld: 450 mg/dL — ABNORMAL HIGH (ref 70–99)
Potassium: 3.6 mmol/L (ref 3.5–5.1)
Sodium: 135 mmol/L (ref 135–145)
Total Bilirubin: 0.2 mg/dL (ref 0.0–1.2)
Total Protein: 6.5 g/dL (ref 6.5–8.1)

## 2023-11-12 LAB — POCT URINALYSIS DIP (MANUAL ENTRY)
Bilirubin, UA: NEGATIVE
Glucose, UA: >=1000 mg/dL — AB
Ketones, POC UA: NEGATIVE mg/dL
Leukocytes, UA: NEGATIVE
Nitrite, UA: NEGATIVE
Protein Ur, POC: NEGATIVE mg/dL
Spec Grav, UA: 1.015 (ref 1.010–1.025)
Urobilinogen, UA: 0.2 U/dL
pH, UA: 5.5 (ref 5.0–8.0)

## 2023-11-12 LAB — CBC
HCT: 39.9 % (ref 36.0–46.0)
Hemoglobin: 13.7 g/dL (ref 12.0–15.0)
MCH: 30.2 pg (ref 26.0–34.0)
MCHC: 34.3 g/dL (ref 30.0–36.0)
MCV: 87.9 fL (ref 80.0–100.0)
Platelets: 218 10*3/uL (ref 150–400)
RBC: 4.54 MIL/uL (ref 3.87–5.11)
RDW: 13 % (ref 11.5–15.5)
WBC: 5.4 10*3/uL (ref 4.0–10.5)
nRBC: 0 % (ref 0.0–0.2)

## 2023-11-12 LAB — POCT FASTING CBG KUC MANUAL ENTRY: POCT Glucose (KUC): 454 mg/dL — AB (ref 70–99)

## 2023-11-12 MED ORDER — NITROFURANTOIN MONOHYD MACRO 100 MG PO CAPS: 100.0000 mg | ORAL_CAPSULE | Freq: Two times a day (BID) | ORAL | 0 refills | Status: DC

## 2023-11-12 MED ORDER — NYSTATIN 100000 UNIT/GM EX POWD: 1.0000 | Freq: Three times a day (TID) | CUTANEOUS | 0 refills | Status: DC

## 2023-11-12 NOTE — ED Triage Notes (Signed)
 Pt showed this writer the skin under breast that was red and tender. Pt also reports same rash on thighs. PT also has dysuria with urgency.

## 2023-11-12 NOTE — Discharge Instructions (Signed)
 Start taking Macrobid twice daily for 5 days for urinary tract infection coverage.  Your urine culture will return in the next few days and someone will call and adjust treatment if needed.  Apply nystatin  powder 3 times daily under your breast.  Try to keep this area clean and dry to avoid further irritation.  We have drawn some lab work today to evaluate your kidney function and electrolytes due to your blood sugar being elevated today.  Someone will call if these results are concerning.  Be sure to take your metformin  and insulin  daily as prescribed.  Follow-up with your primary care provider or return here as needed.

## 2023-11-12 NOTE — ED Provider Notes (Signed)
 [facility]    CSN: [id number] Arrival date & time: 11/12/23  1259      History   Chief Complaint Chief Complaint  Patient presents with   UTI   Rash    HPI Sarah Charles is a 63 y.o. female.   Patient presents with concerns for red itchy rash under her left breast for about a week.  Patient denies applying anything to this rash.  Patient also reports dysuria and urinary frequency x 2 days.  Patient states that she has had UTIs in the past and the symptoms are similar to this.  Denies abdominal pain, flank pain, fever, nausea, vomiting, hematuria, abnormal vaginal discharge, vaginal itching, and vaginal irritation.    Patient reports history of type 2 diabetes.  Patient states that she takes Metformin  and Lantus  daily for management of this.  Patient states that she has not yet taken either 1 of these this morning.  Patient states that she did take these yesterday and days prior to that.  Patient denies shortness of breath, dizziness, lightheadedness, polydipsia, polyphasia, weakness, and confusion.  The history is provided by the patient and medical records.  Rash   Past Medical History:  Diagnosis Date   Arthritis    Coronary artery calcification seen on CT scan    a. 07/2017 noted on CT abd.   Depression    GERD (gastroesophageal reflux disease)    Hyperlipidemia    Hypertension    Obesity    Sleep apnea    a. Does not use CPAP cause i dont think I need it anymore.   Tobacco abuse    Type II diabetes mellitus [facility])     Patient Active Problem List   Diagnosis Date Noted   Abnormal findings on diagnostic imaging of digestive system 02/06/2023   Infectious colitis 02/06/2023   Acute blood loss anemia 02/05/2023   Pancolitis (HCC) 02/04/2023   Rectal bleeding 02/04/2023   Acute diarrhea 02/04/2023   Imaging of gastrointestinal tract abnormal 02/04/2023   Colitis with rectal bleeding 02/03/2023   DKA (diabetic ketoacidosis) (HCC) 12/17/2022    Cellulitis 12/17/2022   Nausea and vomiting 12/17/2022   Hypertensive urgency 12/17/2022   Hyperlipidemia LDL goal <70 07/19/2022   Sleep apnea, unspecified 11/26/2020   CAD (coronary artery disease) 11/22/2017   Chest pain, precordial 11/04/2017   Acute chest pain    Type 2 diabetes mellitus with hyperlipidemia (HCC) 10/29/2017   Diabetic polyneuropathy associated with type 2 diabetes mellitus (HCC) 05/30/2017   Mixed dyslipidemia 05/30/2017   Depression 05/31/2016   Uncontrolled type 2 diabetes mellitus with complication 04/08/2016   Uncontrolled type 2 diabetes mellitus with complication, with long-term current use of insulin  04/20/2015   Cellulitis of breast 12/29/2013   Diabetes mellitus (HCC) 12/29/2013   Cellulitis of female breast 12/29/2013   Symptomatic menopausal or female climacteric states 04/08/2008   OTHER SPECIFIED DISEASE OF NAIL 04/08/2008   URINARY URGENCY 04/08/2008   COUGH 04/02/2008   FATIGUE 04/12/2007   Hypertension 04/12/2007   OBESITY, NOS 07/21/2006   Major depressive disorder, recurrent episode (HCC) 07/21/2006   TOBACCO DEPENDENCE 07/21/2006   GASTROESOPHAGEAL REFLUX, NO ESOPHAGITIS 07/21/2006   INCONTINENCE, STRESS, FEMALE 07/21/2006    Past Surgical History:  Procedure Laterality Date   BIOPSY  12/21/2022   Procedure: BIOPSY;  Surgeon: Saintclair Jasper, MD;  Location: [facility];  Service: Gastroenterology;;   CHOLECYSTECTOMY     CORONARY PRESSURE/FFR STUDY N/A 11/04/2017   Procedure: INTRAVASCULAR PRESSURE WIRE/FFR STUDY;  Surgeon: Swaziland, Peter M, MD;  Location: [facility] INVASIVE CV LAB;  Service: Cardiovascular;  Laterality: N/A;   CORONARY STENT INTERVENTION N/A 11/04/2017   Procedure: CORONARY STENT INTERVENTION;  Surgeon: Swaziland, Peter M, MD;  Location: [facility] INVASIVE CV LAB;  Service: Cardiovascular;  Laterality: N/A;   ESOPHAGOGASTRODUODENOSCOPY (EGD) WITH PROPOFOL  N/A 12/21/2022   Procedure: ESOPHAGOGASTRODUODENOSCOPY (EGD) WITH PROPOFOL ;  Surgeon:  Saintclair Jasper, MD;  Location: [facility];  Service: Gastroenterology;  Laterality: N/A;   INCISION AND DRAINAGE ABSCESS Left 12/22/2012   Procedure: INCISION AND DRAINAGE ABSCESS;  Surgeon: Oneil DELENA Budge, MD;  Location: [facility];  Service: General;  Laterality: Left;   LEFT HEART CATH AND CORONARY ANGIOGRAPHY N/A 11/04/2017   Procedure: LEFT HEART CATH AND CORONARY ANGIOGRAPHY;  Surgeon: Swaziland, Peter M, MD;  Location: [facility] INVASIVE CV LAB;  Service: Cardiovascular;  Laterality: N/A;   TONSILLECTOMY     TUBAL LIGATION      OB History   No obstetric history on file.      Home Medications    Prior to Admission medications   Medication Sig Start Date End Date Taking? Authorizing Provider  aspirin  EC 81 MG tablet Take 1 tablet (81 mg total) by mouth daily. Swallow whole. 08/25/23  Yes Newlin, Enobong, MD  benzonatate  (TESSALON ) 200 MG capsule Take 1 capsule (200 mg total) by mouth 3 (three) times daily as needed for cough. 08/30/23  Yes Piontek, Erin, MD  Blood Glucose Monitoring Suppl (ACCU-CHEK GUIDE) w/Device KIT Use to check blood sugar 3 times daily. E11.65 10/03/23  Yes Newlin, Enobong, MD  buPROPion  (WELLBUTRIN  SR) 150 MG 12 hr tablet Take 1 tablet (150 mg total) by mouth 2 (two) times daily. 07/21/23  Yes Newlin, Enobong, MD  carvedilol  (COREG ) 3.125 MG tablet Take 1 tablet (3.125 mg total) by mouth 2 (two) times daily with a meal. 07/21/23  Yes Newlin, Enobong, MD  diclofenac  Sodium (VOLTAREN ) 1 % GEL Apply 4 g topically 4 (four) times daily. 05/05/21  Yes Newlin, Enobong, MD  fenofibrate  (TRICOR ) 145 MG tablet Take 1 tablet (145 mg total) by mouth every evening. 07/21/23  Yes Newlin, Enobong, MD  FISH OIL-CHOLECALCIFEROL PO Take by mouth.   Yes [provider]  FLUoxetine  (PROZAC ) 20 MG capsule Take 1 capsule (20 mg total) by mouth every morning. 07/21/23  Yes Newlin, Enobong, MD  furosemide  (LASIX ) 20 MG tablet Take 1 tablet (20 mg total) by mouth daily. 07/21/23  Yes Newlin, Enobong, MD   gabapentin  (NEURONTIN ) 300 MG capsule Take 2 capsules (600 mg total) by mouth 3 (three) times daily. 11/02/23  Yes Newlin, Corrina, MD  glucose blood (ACCU-CHEK GUIDE TEST) test strip Use to check blood sugar 3 times daily. 08/23/23  Yes Newlin, Enobong, MD  Insulin  Pen Needle (COMFORT EZ PEN NEEDLES) 31G X 5 MM MISC Inject 1 each into the skin in the morning and at bedtime. use as directed 07/21/23  Yes Newlin, Corrina, MD  Lancets (ONETOUCH DELICA PLUS LANCET33G) MISC Use to check blood sugar 3 times daily. 08/23/23  Yes Newlin, Enobong, MD  Lancets Misc. (ACCU-CHEK FASTCLIX LANCET) KIT USE AS DIRECTED 11/12/21  Yes Newlin, Enobong, MD  lisinopril  (ZESTRIL ) 40 MG tablet Take 1 tablet (40 mg total) by mouth daily. 07/21/23  Yes Newlin, Enobong, MD  metFORMIN  (GLUCOPHAGE ) 500 MG tablet Take 2 tablets (1,000 mg total) by mouth 2 (two) times daily. 07/21/23  Yes Newlin, Enobong, MD  methocarbamol  (ROBAXIN ) 750 MG tablet TAKE 1 TABLET (750 MG TOTAL) BY MOUTH  EVERY 8 (EIGHT) HOURS AS NEEDED FOR MUSCLE SPASMS. 10/12/23  Yes Newlin, Enobong, MD  metoCLOPramide  (REGLAN ) 10 MG tablet Take 1 tablet (10 mg total) by mouth every 8 (eight) hours as needed for nausea and vomiting 07/21/23  Yes Newlin, Enobong, MD  mupirocin  ointment (BACTROBAN ) 2 % Apply 1 Application topically 2 (two) times daily. 05/01/23  Yes Garrison, [place]  N, FNP  nitrofurantoin, macrocrystal-monohydrate, (MACROBID) 100 MG capsule Take 1 capsule (100 mg total) by mouth 2 (two) times daily. 11/12/23  Yes Johnie Flaming A, NP  nystatin  powder Apply 1 Application topically 3 (three) times daily. 11/12/23  Yes Johnie, Rossi Burdo A, NP  pantoprazole  (PROTONIX ) 40 MG tablet Take 1 tablet (40 mg total) by mouth 2 (two) times daily. 07/21/23  Yes Newlin, Enobong, MD  rosuvastatin  (CRESTOR ) 40 MG tablet Take 1 tablet (40 mg total) by mouth every evening. 07/21/23  Yes Newlin, Enobong, MD  traZODone  (DESYREL ) 50 MG tablet Take 1 tablet (50 mg total) by mouth at  bedtime. 07/21/23  Yes Newlin, Enobong, MD  triamcinolone  cream (KENALOG ) 0.1 % Apply 1 Application topically 2 (two) times daily. 07/21/23  Yes Newlin, Enobong, MD  cetirizine  (ZYRTEC ) 10 MG tablet TAKE 1 TABLET BY MOUTH EVERY DAY Patient taking differently: Take 10 mg by mouth daily. 08/02/19   Newlin, Enobong, MD  clotrimazole  (LOTRIMIN ) 1 % cream APPLY TO THE AFFECTED AREA(S) beneath BOTH breasts TWICE DAILY Patient taking differently: Apply 1 Application topically 2 (two) times daily. 03/08/22   Newlin, Enobong, MD  diphenhydrAMINE  (BENADRYL ) 25 MG tablet Take 1 tablet (25 mg total) by mouth every 6 (six) hours as needed. 05/01/23   Dreama, [place]  N, FNP  insulin  glargine (LANTUS  SOLOSTAR) 100 UNIT/ML Solostar Pen Inject 33 Units into the skin 2 (two) times daily. 07/21/23   Newlin, Enobong, MD  lidocaine  (LIDODERM ) 5 % Place ONE PATCH onto THE SKIN daily. REMOVE & Discard PATCH WITHIN 12 hours OR as directed by MD Patient taking differently: Place 1 patch onto the skin every 12 (twelve) hours. 12/30/22   Newlin, Enobong, MD  nitroGLYCERIN  (NITROSTAT ) 0.4 MG SL tablet Place 0.4 mg under the tongue every 5 (five) minutes as needed for chest pain.    [provider]  oxyCODONE  (OXY IR/ROXICODONE ) 5 MG immediate release tablet Take 5 mg by mouth every 4 (four) hours as needed for moderate pain. 11/29/22   [provider]  sucralfate  (CARAFATE ) 1 g tablet Take 1 g by mouth 4 (four) times daily -  with meals and at bedtime.    [provider]  sulfamethoxazole -trimethoprim  (BACTRIM  DS) 800-160 MG tablet Take 1 tablet by mouth 2 (two) times daily. Patient not taking: Reported on 10/20/2023 09/15/23   Danton Jon HERO, PA-C    Family History Family History  Problem Relation Age of Onset   Hypertension Mother    CVA Mother    COPD Mother    Heart disease Mother    Kidney Stones Mother    Heart attack Mother        MI in late 75's   Breast cancer Mother    CAD Father     Hypercholesterolemia Father    Heart attack Father        Died @ 22 of MI   Diabetes Sister    Cancer Maternal Uncle    Diabetes Maternal Grandmother    Cancer Maternal Grandfather        lung cancer   Colon cancer Neg Hx    Esophageal  cancer Neg Hx    Stomach cancer Neg Hx    Pancreatic cancer Neg Hx     Social History Social History   Tobacco Use   Smoking status: Every Day    Current packs/day: 0.00    Average packs/day: 1 pack/day for 41.0 years (41.0 ttl pk-yrs)    Types: Cigarettes    Start date: 10/06/1977    Last attempt to quit: 10/07/2018    Years since quitting: 5.1   Smokeless tobacco: Never  Vaping Use   Vaping status: Never Used  Substance Use Topics   Alcohol use: No   Drug use: No     Allergies   Cymbalta [duloxetine hcl]   Review of Systems Review of Systems  Skin:  Positive for rash.   Per HPI  Physical Exam Triage Vital Signs ED Triage Vitals  Encounter Vitals Group     BP 11/12/23 1319 (!) 148/83     Girls Systolic BP Percentile --      Girls Diastolic BP Percentile --      Boys Systolic BP Percentile --      Boys Diastolic BP Percentile --      Pulse Rate 11/12/23 1319 81     Resp 11/12/23 1319 20     Temp 11/12/23 1319 98.1 F (36.7 C)     Temp src --      SpO2 11/12/23 1319 92 %     Weight --      Height --      Head Circumference --      Peak Flow --      Pain Score 11/12/23 1316 8     Pain Loc --      Pain Education --      Exclude from Growth Chart --    No data found.  Updated Vital Signs BP (!) 148/83   Pulse 81   Temp 98.1 F (36.7 C)   Resp 20   SpO2 92%   Visual Acuity Right Eye Distance:   Left Eye Distance:   Bilateral Distance:    Right Eye Near:   Left Eye Near:    Bilateral Near:     Physical Exam Vitals and nursing note reviewed.  Constitutional:      General: She is awake. She is not in acute distress.    Appearance: Normal appearance. She is well-developed and well-groomed. She is not  ill-appearing.  Abdominal:     General: Abdomen is protuberant. There is no distension.     Palpations: Abdomen is soft.     Tenderness: There is no abdominal tenderness. There is no right CVA tenderness, left CVA tenderness or guarding.   Skin:    General: Skin is warm and dry.     Findings: Rash present.     Comments: Beefy red rash noted under left breast consistent with candidal dermatitis.   Neurological:     General: No focal deficit present.     Mental Status: She is alert and oriented to person, place, and time. Mental status is at baseline.   Psychiatric:        Behavior: Behavior is cooperative.      UC Treatments / Results  Labs (all labs ordered are listed, but only abnormal results are displayed) Labs Reviewed  POCT URINALYSIS DIP (MANUAL ENTRY) - Abnormal; Notable for the following components:      Result Value   Glucose, UA >=1,000 (*)    Blood, UA trace-lysed (*)  All other components within normal limits  POCT FASTING CBG [facility] MANUAL ENTRY - Abnormal; Notable for the following components:   POCT Glucose ([facility]) 454 (*)    All other components within normal limits  URINE CULTURE  CBC  COMPREHENSIVE METABOLIC PANEL WITH GFR    EKG   Radiology No results found.  Procedures Procedures (including critical care time)  Medications Ordered in UC Medications - No data to display  Initial Impression / Assessment and Plan / UC Course  I have reviewed the triage vital signs and the nursing notes.  Pertinent labs & imaging results that were available during my care of the patient were reviewed by me and considered in my medical decision making (see chart for details).     Patient is overall well-appearing.  Vitals are stable.  Upon assessment patient has a beefy red rash noted under her left breast consistent with candidal dermatitis.  No other significant findings upon exam.  GCS 15.  Urinalysis did not reveal any obvious signs of infection, will send  urine culture to confirm.  Urinalysis did reveal large amounts of glucose in the urine.  Likely related to patient not taking her medication for diabetes today.  CBG 454 in clinic.  Ordered CBC and CMP to evaluate kidney function and to rule out underlying infection or electrolyte imbalance.  Prescribed Macrobid for empirical treatment of acute cystitis due to patient actively having symptoms of urinary tract infection.  Prescribed nystatin  powder for candidal dermatitis under breast.  Discussed importance of taking metformin  and insulin  as prescribed daily.  Discussed follow-up and return precautions. Final Clinical Impressions(s) / UC Diagnoses   Final diagnoses:  Candidal dermatitis  Hyperglycemia  Dysuria  Polyuria  Type 2 diabetes mellitus without complication, with long-term current use of insulin  [facility])     Discharge Instructions      Start taking Macrobid twice daily for 5 days for urinary tract infection coverage.  Your urine culture will return in the next few days and someone will call and adjust treatment if needed.  Apply nystatin  powder 3 times daily under your breast.  Try to keep this area clean and dry to avoid further irritation.  We have drawn some lab work today to evaluate your kidney function and electrolytes due to your blood sugar being elevated today.  Someone will call if these results are concerning.  Be sure to take your metformin  and insulin  daily as prescribed.  Follow-up with your primary care provider or return here as needed.    ED Prescriptions     Medication Sig Dispense Auth. Provider   nystatin  powder Apply 1 Application topically 3 (three) times daily. 15 g Johnie Flaming A, NP   nitrofurantoin, macrocrystal-monohydrate, (MACROBID) 100 MG capsule Take 1 capsule (100 mg total) by mouth 2 (two) times daily. 10 capsule Johnie Flaming A, NP      PDMP not reviewed this encounter.   Johnie Flaming A, NP 11/12/23 [phone]

## 2023-11-14 ENCOUNTER — Ambulatory Visit (HOSPITAL_COMMUNITY): Payer: Self-pay

## 2023-11-14 LAB — URINE CULTURE: Culture: >=100000 — AB

## 2023-11-15 MED ORDER — CEFDINIR 300 MG PO CAPS: 300.0000 mg | ORAL_CAPSULE | Freq: Two times a day (BID) | ORAL | 0 refills | Status: AC

## 2023-11-17 ENCOUNTER — Telehealth: Payer: Self-pay | Admitting: Family Medicine

## 2023-11-17 ENCOUNTER — Other Ambulatory Visit (HOSPITAL_COMMUNITY): Payer: Self-pay

## 2023-11-17 ENCOUNTER — Other Ambulatory Visit: Payer: Self-pay

## 2023-11-17 NOTE — Telephone Encounter (Signed)
 Copied from CRM [id number]. Topic: Clinical - Prescription Issue >> Nov 17, 2023 10:19 AM Sarah Charles wrote:  Reason for CRM: patient stated she received a call from outpatient hospital pharmacy that she had another prescription but she called back and they said they now don't have anything , wants a call back at her number on file

## 2023-11-17 NOTE — Patient Outreach (Addendum)
 Complex Care Management   Visit Note  11/17/2023  Name:  Sarah Charles MRN: [id number] DOB: 1960-07-19  Situation: Referral received for Complex Care Management related to DM, HTN, back pain. I obtained verbal consent from Patient.  Visit completed with [name]  on the phone. Main concern today is she went to urgent care 11/12/23 for rash under her breast and dysuria, has UTI. UC MD prescribed nystatin  powder for the rash, macrobid for the UTI - he has called in another script since culture indicated need for a different antibiotic. She is unsure where the script was sent.   Background:   Past Medical History:  Diagnosis Date   Arthritis    Coronary artery calcification seen on CT scan    a. 07/2017 noted on CT abd.   Depression    GERD (gastroesophageal reflux disease)    Hyperlipidemia    Hypertension    Obesity    Sleep apnea    a. Does not use CPAP cause i dont think I need it anymore.   Tobacco abuse    Type II diabetes mellitus (HCC)     Assessment: Patient Reported Symptoms:  Cognitive Cognitive Status: Alert and oriented to person, place, and time, Insightful and able to interpret abstract concepts, Normal speech and language skills      Neurological Neurological Review of Symptoms: Not assessed    HEENT HEENT Symptoms Reported: Not assessed      Cardiovascular Cardiovascular Symptoms Reported: Not assessed    Respiratory      Endocrine Patient reports the following symptoms related to hypoglycemia or hyperglycemia : No symptoms reported (A1C 8.5% 09/15/23) Is patient diabetic?: Yes Is patient checking blood sugars at home?:  (Not able to assess on this contact.)    Gastrointestinal Gastrointestinal Symptoms Reported: Not assessed      Genitourinary Genitourinary Symptoms Reported: Urgency, Frequency Additional Genitourinary Details: Went to Urgent Care 6/21 for dysuria, urinary freq, prescribed Macrobid, after culture returned MD prescribed cefdinir 300mg   twice daily x 7 days. Patient was not sure where to pick it up, this RNCM reviewed med reports, [facility], informed patient, she will pick up and start taking, understands to stop Macrobid.    Integumentary Integumentary Symptoms Reported: Other Other Integumentary Symptoms: Urgent Care visit for rash under breast on 6/21, MD prescribed nystatin  powder, patient reports rash has improved.  Discussed keeping the area clean and dry, placing clean wash cloths under breast to protect her skin since she doesn't wear a bra, change the cloths if moisture develops.    Musculoskeletal Musculoskelatal Symptoms Reviewed: No symptoms reported Other Musculoskeletal Symptoms: Denies back pain today.        Psychosocial Psychosocial Symptoms Reported: Not assessed            10/20/2023    1:23 PM  Depression screen PHQ 2/9  Decreased Interest 0  Down, Depressed, Hopeless 0  PHQ - 2 Score 0    There were no vitals filed for this visit.  Medications Reviewed Today   Medications were not reviewed in this encounter     Recommendation:   -This RNCM researched notes from urgent care visit, she is to pick up from [facility] cefdinir 300mg  twice daily x 7 days, STOP macrobid  -She will report worsening rash under breast to PCP, will keep area clean and dry, apply nystatin  powder.  -She will monitor blood sugars, take insulin  and metformin  as ordered.  -She will work on  increasing fluid intake to stay hydrated.   Follow Up Plan:   Telephone follow-up two weeks.   Santana Stamp BSN, CCM [place]  VBCI Population Health RN Care Manager Direct Dial: [phone]  Fax: [phone]

## 2023-11-17 NOTE — Patient Instructions (Signed)
 Visit Information  Thank you for taking time to visit with me today. Please don't hesitate to contact me if I can be of assistance to you before our next scheduled appointment.  Your next care management appointment is by telephone on Thursday, July 10th at 3:00pm with Wilbert Diver.    Please call the care guide team at [phone] if you need to cancel, schedule, or reschedule an appointment.   A reminder to ALL patients/family/friends, please call the [place]  National Suicide Prevention Lifeline: 808-838-3293 or TTY: 518-433-7295 TTY [phone]) to talk to a trained counselor if you are experiencing a Mental Health or Behavioral Health Crisis or need someone to talk to.  Santana Stamp BSN, CCM [place]  VBCI Population Health RN Care Manager Direct Dial: [phone]  Fax: [phone]

## 2023-12-01 ENCOUNTER — Other Ambulatory Visit: Payer: Self-pay

## 2023-12-01 NOTE — Addendum Note (Signed)
 Addended by: Sanita Estrada Y on: 12/01/2023 09:31 PM   Modules accepted: Orders, Level of Service

## 2023-12-01 NOTE — Progress Notes (Signed)
 This encounter was created in error - please disregard.

## 2023-12-02 ENCOUNTER — Encounter (HOSPITAL_COMMUNITY): Payer: Self-pay

## 2023-12-02 ENCOUNTER — Telehealth: Payer: Self-pay

## 2023-12-02 ENCOUNTER — Ambulatory Visit (HOSPITAL_COMMUNITY)
Admission: EM | Admit: 2023-12-02 | Discharge: 2023-12-02 | Disposition: A | Discharge status: Home / Self Care | Attending: Emergency Medicine | Admitting: Emergency Medicine

## 2023-12-02 DIAGNOSIS — N3000 Acute cystitis without hematuria: Secondary | ICD-10-CM | POA: Diagnosis not present

## 2023-12-02 LAB — POCT URINALYSIS DIP (MANUAL ENTRY)
Bilirubin, UA: NEGATIVE
Blood, UA: NEGATIVE
Glucose, UA: 500 mg/dL — AB
Leukocytes, UA: NEGATIVE
Nitrite, UA: POSITIVE — AB
Protein Ur, POC: NEGATIVE mg/dL
Spec Grav, UA: 1.015 (ref 1.010–1.025)
Urobilinogen, UA: 0.2 U/dL
pH, UA: 5 (ref 5.0–8.0)

## 2023-12-02 MED ORDER — CEPHALEXIN 500 MG PO CAPS: 500.0000 mg | ORAL_CAPSULE | Freq: Two times a day (BID) | ORAL | 0 refills | Status: AC

## 2023-12-02 NOTE — Patient Outreach (Signed)
 Complex Care Management   Visit Note  12/02/2023  Name:  Sarah Charles MRN: [id number] DOB: 12/26/1960  Situation: Referral received for Complex Care Management related to HTN, DM. I obtained verbal consent from Patient.  Visit completed with Ms. Meding  on the phone.  Main concern today ispatient reports urinary symptoms: uncomfortable feeling in bladder, urine frequency/urgency/occasional incontinence.  Denies burning/odorous urine, denies SHOB.   Background:   Past Medical History:  Diagnosis Date   Arthritis    Coronary artery calcification seen on CT scan    a. 07/2017 noted on CT abd.   Depression    GERD (gastroesophageal reflux disease)    Hyperlipidemia    Hypertension    Obesity    Sleep apnea    a. Does not use CPAP cause i dont think I need it anymore.   Tobacco abuse    Type II diabetes mellitus (HCC)     Assessment: Patient Reported Symptoms:  Cognitive Cognitive Status: No symptoms reported, Alert and oriented to person, place, and time, Normal speech and language skills      Neurological Neurological Review of Symptoms: Not assessed    HEENT HEENT Symptoms Reported: Not assessed      Cardiovascular Cardiovascular Symptoms Reported: No symptoms reported    Respiratory Respiratory Symptoms Reported: No symptoms reported    Endocrine Endocrine Symptoms Reported: Not assessed    Gastrointestinal Gastrointestinal Symptoms Reported: Not assessed      Genitourinary Genitourinary Symptoms Reported: Frequency, Incontinence, Urgency Additional Genitourinary Details: Patient will be going to Urgent Care this morning for urinalysis due to uncomfortable in bladder area, feels like I have to pee all the time, sometimes I can't make it to the bathroom.    Integumentary Integumentary Symptoms Reported: Not assessed    Musculoskeletal Musculoskelatal Symptoms Reviewed: Not assessed        Psychosocial Psychosocial Symptoms Reported: Not assessed             10/20/2023    1:23 PM  Depression screen PHQ 2/9  Decreased Interest 0  Down, Depressed, Hopeless 0  PHQ - 2 Score 0    There were no vitals filed for this visit.  Medications Reviewed Today   Medications were not reviewed in this encounter     Recommendation:   Continue Current Plan of Care -Instructed Ms. Eustache to head to urgent care to be seen for possible UTI. She will call this RNCM or PCP office if she has any difficulty with continued symptoms or obtaining medications.    Follow Up Plan:   Telephone follow-up in 1 week  Santana Stamp BSN, CCM   [facility] Population Health RN Care Manager Direct Dial: [phone]  Fax: [phone]

## 2023-12-02 NOTE — ED Triage Notes (Signed)
 Patient presenting with urinary frequency and states it  is hard to urinate onset over 2 weeks ago. Patient was seen 2 weeks ago for the same symptoms.  Prescriptions or OTC medications tried: Yes- antibiotics    with little relief

## 2023-12-02 NOTE — Discharge Instructions (Addendum)
 Your urine showed that you continue to have a urinary tract infection.  Take the Keflex  twice daily with food. Symptoms should improve over the next few days, follow-up with your primary care provider in 7 days to ensure resolution.  Ensure you are drinking at least 64 ounces of water daily.   We will contact you if we need to modify the anything based on your culture results.  Return to clinic for new symptoms.

## 2023-12-02 NOTE — ED Provider Notes (Signed)
 [facility]    CSN: [id number] Arrival date & time: 12/02/23  1105      History   Chief Complaint Chief Complaint  Patient presents with   Urinary Frequency    HPI Sarah Charles is a 63 y.o. female.   Patient presents to clinic for concern of urinary frequency and urgency that has been ongoing for the past 2 weeks.  Having episodes of incontinence on herself and difficulty starting her stream.  Was recently treated for UTI with Macrobid , culture showed that bacteria had intermediate resistance so patient was started on cefdinir .  Completed all the antibiotics with no improvement in symptoms.  Denies abdominal pain, nausea, vomiting, flank pain or fevers.  Has been drinking plenty of water to flush out her kidneys.  The history is provided by the patient and medical records.  Urinary Frequency    Past Medical History:  Diagnosis Date   Arthritis    Coronary artery calcification seen on CT scan    a. 07/2017 noted on CT abd.   Depression    GERD (gastroesophageal reflux disease)    Hyperlipidemia    Hypertension    Obesity    Sleep apnea    a. Does not use CPAP cause i dont think I need it anymore.   Tobacco abuse    Type II diabetes mellitus [facility])     Patient Active Problem List   Diagnosis Date Noted   Abnormal findings on diagnostic imaging of digestive system 02/06/2023   Infectious colitis 02/06/2023   Acute blood loss anemia 02/05/2023   Pancolitis (HCC) 02/04/2023   Rectal bleeding 02/04/2023   Acute diarrhea 02/04/2023   Imaging of gastrointestinal tract abnormal 02/04/2023   Colitis with rectal bleeding 02/03/2023   DKA (diabetic ketoacidosis) (HCC) 12/17/2022   Cellulitis 12/17/2022   Nausea and vomiting 12/17/2022   Hypertensive urgency 12/17/2022   Hyperlipidemia LDL goal <70 07/19/2022   Sleep apnea, unspecified 11/26/2020   CAD (coronary artery disease) 11/22/2017   Chest pain, precordial 11/04/2017   Acute chest pain    Type  2 diabetes mellitus with hyperlipidemia (HCC) 10/29/2017   Diabetic polyneuropathy associated with type 2 diabetes mellitus (HCC) 05/30/2017   Mixed dyslipidemia 05/30/2017   Depression 05/31/2016   Uncontrolled type 2 diabetes mellitus with complication 04/08/2016   Uncontrolled type 2 diabetes mellitus with complication, with long-term current use of insulin  04/20/2015   Cellulitis of breast 12/29/2013   Diabetes mellitus (HCC) 12/29/2013   Cellulitis of female breast 12/29/2013   Symptomatic menopausal or female climacteric states 04/08/2008   OTHER SPECIFIED DISEASE OF NAIL 04/08/2008   URINARY URGENCY 04/08/2008   COUGH 04/02/2008   FATIGUE 04/12/2007   Hypertension 04/12/2007   OBESITY, NOS 07/21/2006   Major depressive disorder, recurrent episode (HCC) 07/21/2006   TOBACCO DEPENDENCE 07/21/2006   GASTROESOPHAGEAL REFLUX, NO ESOPHAGITIS 07/21/2006   INCONTINENCE, STRESS, FEMALE 07/21/2006    Past Surgical History:  Procedure Laterality Date   BIOPSY  12/21/2022   Procedure: BIOPSY;  Surgeon: Saintclair Jasper, MD;  Location: [facility];  Service: Gastroenterology;;   CHOLECYSTECTOMY     CORONARY PRESSURE/FFR STUDY N/A 11/04/2017   Procedure: INTRAVASCULAR PRESSURE WIRE/FFR STUDY;  Surgeon: Swaziland, Peter M, MD;  Location: [facility] INVASIVE CV LAB;  Service: Cardiovascular;  Laterality: N/A;   CORONARY STENT INTERVENTION N/A 11/04/2017   Procedure: CORONARY STENT INTERVENTION;  Surgeon: Swaziland, Peter M, MD;  Location: [facility] INVASIVE CV LAB;  Service: Cardiovascular;  Laterality: N/A;   ESOPHAGOGASTRODUODENOSCOPY (EGD)  WITH PROPOFOL  N/A 12/21/2022   Procedure: ESOPHAGOGASTRODUODENOSCOPY (EGD) WITH PROPOFOL ;  Surgeon: Saintclair Jasper, MD;  Location: [facility];  Service: Gastroenterology;  Laterality: N/A;   INCISION AND DRAINAGE ABSCESS Left 12/22/2012   Procedure: INCISION AND DRAINAGE ABSCESS;  Surgeon: Oneil DELENA Budge, MD;  Location: [facility];  Service: General;  Laterality: Left;   LEFT HEART CATH  AND CORONARY ANGIOGRAPHY N/A 11/04/2017   Procedure: LEFT HEART CATH AND CORONARY ANGIOGRAPHY;  Surgeon: Swaziland, Peter M, MD;  Location: [facility] INVASIVE CV LAB;  Service: Cardiovascular;  Laterality: N/A;   TONSILLECTOMY     TUBAL LIGATION      OB History   No obstetric history on file.      Home Medications    Prior to Admission medications   Medication Sig Start Date End Date Taking? Authorizing Provider  aspirin  EC 81 MG tablet Take 1 tablet (81 mg total) by mouth daily. Swallow whole. 08/25/23  Yes Newlin, Enobong, MD  Blood Glucose Monitoring Suppl (ACCU-CHEK GUIDE) w/Device KIT Use to check blood sugar 3 times daily. E11.65 10/03/23  Yes Newlin, Enobong, MD  buPROPion  (WELLBUTRIN  SR) 150 MG 12 hr tablet Take 1 tablet (150 mg total) by mouth 2 (two) times daily. 07/21/23  Yes Newlin, Enobong, MD  carvedilol  (COREG ) 3.125 MG tablet Take 1 tablet (3.125 mg total) by mouth 2 (two) times daily with a meal. 07/21/23  Yes Newlin, Enobong, MD  cephALEXin  (KEFLEX ) 500 MG capsule Take 1 capsule (500 mg total) by mouth 2 (two) times daily for 7 days. 12/02/23 12/09/23 Yes Javiana Anwar  N, FNP  cetirizine  (ZYRTEC ) 10 MG tablet TAKE 1 TABLET BY MOUTH EVERY DAY Patient taking differently: Take 10 mg by mouth daily. 08/02/19  Yes Newlin, Corrina, MD  clotrimazole  (LOTRIMIN ) 1 % cream APPLY TO THE AFFECTED AREA(S) beneath BOTH breasts TWICE DAILY Patient taking differently: Apply 1 Application topically 2 (two) times daily. 03/08/22  Yes Newlin, Enobong, MD  diclofenac  Sodium (VOLTAREN ) 1 % GEL Apply 4 g topically 4 (four) times daily. 05/05/21  Yes Newlin, Enobong, MD  diphenhydrAMINE  (BENADRYL ) 25 MG tablet Take 1 tablet (25 mg total) by mouth every 6 (six) hours as needed. 05/01/23  Yes Aylani Spurlock  N, FNP  fenofibrate  (TRICOR ) 145 MG tablet Take 1 tablet (145 mg total) by mouth every evening. 07/21/23  Yes Newlin, Enobong, MD  FISH OIL-CHOLECALCIFEROL PO Take by mouth.   Yes [provider]   FLUoxetine  (PROZAC ) 20 MG capsule Take 1 capsule (20 mg total) by mouth every morning. 07/21/23  Yes Newlin, Enobong, MD  furosemide  (LASIX ) 20 MG tablet Take 1 tablet (20 mg total) by mouth daily. 07/21/23  Yes Newlin, Enobong, MD  gabapentin  (NEURONTIN ) 300 MG capsule Take 2 capsules (600 mg total) by mouth 3 (three) times daily. 11/02/23  Yes Newlin, Corrina, MD  glucose blood (ACCU-CHEK GUIDE TEST) test strip Use to check blood sugar 3 times daily. 08/23/23  Yes Newlin, Enobong, MD  insulin  glargine (LANTUS  SOLOSTAR) 100 UNIT/ML Solostar Pen Inject 33 Units into the skin 2 (two) times daily. 07/21/23  Yes Newlin, Enobong, MD  Insulin  Pen Needle (COMFORT EZ PEN NEEDLES) 31G X 5 MM MISC Inject 1 each into the skin in the morning and at bedtime. use as directed 07/21/23  Yes Newlin, Corrina, MD  Lancets (ONETOUCH DELICA PLUS LANCET33G) MISC Use to check blood sugar 3 times daily. 08/23/23  Yes Newlin, Enobong, MD  Lancets Misc. (ACCU-CHEK FASTCLIX LANCET) KIT USE AS DIRECTED 11/12/21  Yes Newlin,  Enobong, MD  lidocaine  (LIDODERM ) 5 % Place ONE PATCH onto THE SKIN daily. REMOVE & Discard PATCH WITHIN 12 hours OR as directed by MD Patient taking differently: Place 1 patch onto the skin every 12 (twelve) hours. 12/30/22  Yes Newlin, Enobong, MD  lisinopril  (ZESTRIL ) 40 MG tablet Take 1 tablet (40 mg total) by mouth daily. 07/21/23  Yes Newlin, Enobong, MD  metFORMIN  (GLUCOPHAGE ) 500 MG tablet Take 2 tablets (1,000 mg total) by mouth 2 (two) times daily. 07/21/23  Yes Newlin, Corrina, MD  methocarbamol  (ROBAXIN ) 750 MG tablet TAKE 1 TABLET (750 MG TOTAL) BY MOUTH EVERY 8 (EIGHT) HOURS AS NEEDED FOR MUSCLE SPASMS. 10/12/23  Yes Newlin, Enobong, MD  metoCLOPramide  (REGLAN ) 10 MG tablet Take 1 tablet (10 mg total) by mouth every 8 (eight) hours as needed for nausea and vomiting 07/21/23  Yes Newlin, Enobong, MD  nystatin  powder Apply 1 Application topically 3 (three) times daily. 11/12/23  Yes Johnie, Carley A, NP   oxyCODONE  (OXY IR/ROXICODONE ) 5 MG immediate release tablet Take 5 mg by mouth every 4 (four) hours as needed for moderate pain. 11/29/22  Yes [provider]  pantoprazole  (PROTONIX ) 40 MG tablet Take 1 tablet (40 mg total) by mouth 2 (two) times daily. 07/21/23  Yes Newlin, Enobong, MD  rosuvastatin  (CRESTOR ) 40 MG tablet Take 1 tablet (40 mg total) by mouth every evening. 07/21/23  Yes Newlin, Enobong, MD  sucralfate  (CARAFATE ) 1 g tablet Take 1 g by mouth 4 (four) times daily -  with meals and at bedtime.   Yes [provider]  traZODone  (DESYREL ) 50 MG tablet Take 1 tablet (50 mg total) by mouth at bedtime. 07/21/23  Yes Newlin, Enobong, MD  triamcinolone  cream (KENALOG ) 0.1 % Apply 1 Application topically 2 (two) times daily. 07/21/23  Yes Newlin, Enobong, MD  nitroGLYCERIN  (NITROSTAT ) 0.4 MG SL tablet Place 0.4 mg under the tongue every 5 (five) minutes as needed for chest pain.    [provider]    Family History Family History  Problem Relation Age of Onset   Hypertension Mother    CVA Mother    COPD Mother    Heart disease Mother    Kidney Stones Mother    Heart attack Mother        MI in late 9's   Breast cancer Mother    CAD Father    Hypercholesterolemia Father    Heart attack Father        Died @ 58 of MI   Diabetes Sister    Cancer Maternal Uncle    Diabetes Maternal Grandmother    Cancer Maternal Grandfather        lung cancer   Colon cancer Neg Hx    Esophageal cancer Neg Hx    Stomach cancer Neg Hx    Pancreatic cancer Neg Hx     Social History Social History   Tobacco Use   Smoking status: Every Day    Current packs/day: 0.00    Average packs/day: 1 pack/day for 41.0 years (41.0 ttl pk-yrs)    Types: Cigarettes    Start date: 10/06/1977    Last attempt to quit: 10/07/2018    Years since quitting: 5.1   Smokeless tobacco: Never  Vaping Use   Vaping status: Never Used  Substance Use Topics   Alcohol use: No   Drug use: No      Allergies   Cymbalta [duloxetine hcl]   Review of Systems Review of Systems  Per  HPI  Physical Exam Triage Vital Signs ED Triage Vitals  Encounter Vitals Group     BP 12/02/23 1145 (!) 146/83     Girls Systolic BP Percentile --      Girls Diastolic BP Percentile --      Boys Systolic BP Percentile --      Boys Diastolic BP Percentile --      Pulse Rate 12/02/23 1145 74     Resp 12/02/23 1145 18     Temp 12/02/23 1211 98 F (36.7 C)     Temp Source 12/02/23 1211 Oral     SpO2 12/02/23 1145 93 %     Weight --      Height 12/02/23 1145 5' 2 (1.575 m)     Head Circumference --      Peak Flow --      Pain Score 12/02/23 1143 7     Pain Loc --      Pain Education --      Exclude from Growth Chart --    No data found.  Updated Vital Signs BP (!) 146/83 (BP Location: Right Arm)   Pulse 74   Temp 98 F (36.7 C) (Oral)   Resp 18   Ht 5' 2 (1.575 m)   SpO2 93%   BMI 36.21 kg/m   Visual Acuity Right Eye Distance:   Left Eye Distance:   Bilateral Distance:    Right Eye Near:   Left Eye Near:    Bilateral Near:     Physical Exam Vitals and nursing note reviewed.  Constitutional:      Appearance: Normal appearance.  HENT:     Head: Normocephalic and atraumatic.     Right Ear: External ear normal.     Left Ear: External ear normal.     Nose: Nose normal.     Mouth/Throat:     Mouth: Mucous membranes are moist.  Eyes:     Conjunctiva/sclera: Conjunctivae normal.  Cardiovascular:     Rate and Rhythm: Normal rate.  Pulmonary:     Effort: Pulmonary effort is normal. No respiratory distress.  Abdominal:     Tenderness: There is no right CVA tenderness or left CVA tenderness.  Neurological:     General: No focal deficit present.     Mental Status: She is alert and oriented to person, place, and time.  Psychiatric:        Mood and Affect: Mood normal.        Behavior: Behavior normal.      UC Treatments / Results  Labs (all labs ordered are  listed, but only abnormal results are displayed) Labs Reviewed  POCT URINALYSIS DIP (MANUAL ENTRY) - Abnormal; Notable for the following components:      Result Value   Clarity, UA hazy (*)    Glucose, UA =500 (*)    Ketones, POC UA trace (5) (*)    Nitrite, UA Positive (*)    All other components within normal limits  URINE CULTURE    EKG   Radiology No results found.  Procedures Procedures (including critical care time)  Medications Ordered in UC Medications - No data to display  Initial Impression / Assessment and Plan / UC Course  I have reviewed the triage vital signs and the nursing notes.  Pertinent labs & imaging results that were available during my care of the patient were reviewed by me and considered in my medical decision making (see chart for details).  Vitals and triage  reviewed, patient is hemodynamically stable.  Negative for CVA tenderness, afebrile without systemic symptoms, low concern for pyelonephritis at this time.  UA concerning for continued infection, previous infection was susceptible to Keflex , will send in twice daily for 7 days and send urine for culture.  Plan of care, follow-up care return precautions given, no questions at this time.     Final Clinical Impressions(s) / UC Diagnoses   Final diagnoses:  Acute cystitis without hematuria     Discharge Instructions      Your urine showed that you continue to have a urinary tract infection.  Take the Keflex  twice daily with food. Symptoms should improve over the next few days, follow-up with your primary care provider in 7 days to ensure resolution.  Ensure you are drinking at least 64 ounces of water daily.   We will contact you if we need to modify the anything based on your culture results.  Return to clinic for new symptoms.    ED Prescriptions     Medication Sig Dispense Auth. Provider   cephALEXin  (KEFLEX ) 500 MG capsule Take 1 capsule (500 mg total) by mouth 2 (two) times daily  for 7 days. 14 capsule Dreama, Darell Saputo  N, FNP      PDMP not reviewed this encounter.   Dreama Priscella SAILOR, FNP 12/02/23 1248

## 2023-12-02 NOTE — Patient Instructions (Signed)
 Visit Information  Thank you for taking time to visit with me today. Please don't hesitate to contact me if I can be of assistance to you before our next scheduled appointment.  Your next care management appointment is by telephone on Tuesday, July 15th  at 9:30am.   Patient declines AVS sent by mail.   Please call the care guide team at [phone] if you need to cancel, schedule, or reschedule an appointment.   A reminder to ALL patients/family/friends, please call the [place]  National Suicide Prevention Lifeline: 808 625 7545 or TTY: 803 668 4412 TTY [phone]) to talk to a trained counselor if you are experiencing a Mental Health or Behavioral Health Crisis or need someone to talk to.  Santana Stamp BSN, CCM [place]  VBCI Population Health RN Care Manager Direct Dial: [phone]  Fax: [phone]

## 2023-12-04 LAB — URINE CULTURE: Culture: >=100000 — AB

## 2023-12-05 ENCOUNTER — Ambulatory Visit (HOSPITAL_COMMUNITY): Payer: Self-pay

## 2023-12-06 ENCOUNTER — Telehealth: Payer: Self-pay

## 2023-12-07 ENCOUNTER — Ambulatory Visit (INDEPENDENT_AMBULATORY_CARE_PROVIDER_SITE_OTHER): Admitting: Podiatry

## 2023-12-07 DIAGNOSIS — M79675 Pain in left toe(s): Secondary | ICD-10-CM

## 2023-12-07 DIAGNOSIS — B351 Tinea unguium: Secondary | ICD-10-CM | POA: Diagnosis not present

## 2023-12-07 DIAGNOSIS — M79674 Pain in right toe(s): Secondary | ICD-10-CM

## 2023-12-07 NOTE — Progress Notes (Signed)
  Subjective:  Patient ID: Sarah Charles, female    DOB: [birth year],  MRN: [id number]  Chief Complaint  Patient presents with   Nail Problem    Nail trim    63 y.o. female returns for the above complaint.  Patient presents with thickened elongated dystrophic toenails x10.  Patient states that she is a diabetic that has been going on for 2 to 3 years.   There is no swelling or coldness associated.  They are painful to touch.  She would like to have them debrided down as she is not able to debride down herself.  She denies any other acute complaints.  She has not seen anyone else prior to seeing me.  Objective:  There were no vitals filed for this visit. Podiatric Exam: Vascular: dorsalis pedis and posterior tibial pulses are palpable bilateral. Capillary return is immediate. Temperature gradient is WNL. Skin turgor WNL  Sensorium: Normal Semmes Weinstein monofilament test. Normal tactile sensation bilaterally. Nail Exam: Pt has thick disfigured discolored nails with subungual debris noted bilateral entire nail hallux through fifth toenails.  Pain on palpation to the nails. Ulcer Exam: There is no evidence of ulcer or pre-ulcerative changes or infection. Orthopedic Exam: Muscle tone and strength are WNL. No limitations in general ROM. No crepitus or effusions noted. HAV  B/L.  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 Skin: No Porokeratosis. No infection or ulcers    Assessment & Plan:   No diagnosis found.    Patient was evaluated and treated and all questions answered.  Onychomycosis with pain  -Nails palliatively debrided as below. -Educated on self-care  Procedure: Nail Debridement Rationale: pain  Type of Debridement: manual, Folta debridement. Instrumentation: Nail nipper, rotary burr. Number of Nails: 10  Procedures and Treatment: Consent by patient was obtained for treatment procedures. The patient understood the discussion of treatment and procedures well. All questions were answered  thoroughly reviewed. Debridement of mycotic and hypertrophic toenails, 1 through 5 bilateral and clearing of subungual debris. No ulceration, no infection noted.  Return Visit-Office Procedure: Patient instructed to return to the office for a follow up visit 3 months for continued evaluation and treatment.  Franky Blanch, DPM    No follow-ups on file.

## 2023-12-08 ENCOUNTER — Ambulatory Visit: Payer: Self-pay

## 2023-12-08 NOTE — Telephone Encounter (Signed)
 FYI Only or Action Required?: FYI only for provider.  Patient was last seen in primary care on 09/15/2023 by Danton Jon HERO, PA-C.  Called Nurse Triage reporting Urinary Frequency.  Symptoms began several weeks ago.  Interventions attempted: Nothing.  Symptoms are: gradually worsening.  Triage Disposition: See Physician Within 24 Hours  Patient/caregiver understands and will follow disposition?: Yes  Reason for Disposition  Urinating more frequently than usual (i.e., frequency) OR new-onset of the feeling of an urgent need to urinate (i.e., urgency)  Answer Assessment - Initial Assessment Questions 1. SYMPTOM: What's the main symptom you're concerned about? (e.g., frequency, incontinence)     Frequency, Pressure  2. ONSET: When did the  symptoms  start?     Two weeks ago  3. PAIN: Is there any pain? If Yes, ask: How bad is it? (Scale: 1-10; mild, moderate, severe)     No pain, just pressure  4. CAUSE: What do you think is causing the symptoms?     Unsure  5. OTHER SYMPTOMS: Do you have any other symptoms? (e.g., blood in urine, fever, flank pain, pain with urination)     No  6. PREGNANCY: Is there any chance you are pregnant? When was your last menstrual period?     No and no  Protocols used: Urinary Symptoms-A-AH

## 2023-12-08 NOTE — Telephone Encounter (Signed)
 Copied from CRM [id number]. Topic: Clinical - Pink Word Triage >> Dec 08, 2023  8:19 AM Rosaria E wrote: Reason for Triage: Frequent urination, wetting herself in her clothes. Wants to be seen in office today. Nothing available. Says symptoms have lasted for a week, can't take it anymore.   Best contact: [phone]   ----------------------------------------------------------------------- From previous Reason for Contact - Scheduling: Patient/patient representative is calling to schedule an appointment. Refer to attachments for appointment information.

## 2023-12-09 NOTE — Telephone Encounter (Signed)
 Patient has upcoming appointment

## 2023-12-12 ENCOUNTER — Telehealth: Admitting: Nurse Practitioner

## 2023-12-14 ENCOUNTER — Telehealth: Payer: Self-pay | Admitting: Family Medicine

## 2023-12-14 NOTE — Telephone Encounter (Signed)
 pt confirmed appt (per volunteer 7/23)

## 2023-12-15 ENCOUNTER — Other Ambulatory Visit (HOSPITAL_COMMUNITY): Payer: Self-pay

## 2023-12-15 ENCOUNTER — Encounter: Payer: Self-pay | Admitting: Family Medicine

## 2023-12-15 ENCOUNTER — Telehealth: Payer: Self-pay

## 2023-12-15 ENCOUNTER — Other Ambulatory Visit: Payer: Self-pay

## 2023-12-15 ENCOUNTER — Ambulatory Visit: Attending: Family Medicine | Admitting: Family Medicine

## 2023-12-15 ENCOUNTER — Ambulatory Visit: Admitting: Family Medicine

## 2023-12-15 VITALS — BP 138/71 | HR 77 | Wt 183.4 lb

## 2023-12-15 DIAGNOSIS — E785 Hyperlipidemia, unspecified: Secondary | ICD-10-CM | POA: Diagnosis not present

## 2023-12-15 DIAGNOSIS — E119 Type 2 diabetes mellitus without complications: Secondary | ICD-10-CM

## 2023-12-15 DIAGNOSIS — Z634 Disappearance and death of family member: Secondary | ICD-10-CM

## 2023-12-15 DIAGNOSIS — E1142 Type 2 diabetes mellitus with diabetic polyneuropathy: Secondary | ICD-10-CM | POA: Diagnosis not present

## 2023-12-15 DIAGNOSIS — Z91148 Patient's other noncompliance with medication regimen for other reason: Secondary | ICD-10-CM

## 2023-12-15 DIAGNOSIS — I10 Essential (primary) hypertension: Secondary | ICD-10-CM | POA: Diagnosis not present

## 2023-12-15 DIAGNOSIS — E1159 Type 2 diabetes mellitus with other circulatory complications: Secondary | ICD-10-CM

## 2023-12-15 DIAGNOSIS — I2510 Atherosclerotic heart disease of native coronary artery without angina pectoris: Secondary | ICD-10-CM | POA: Diagnosis not present

## 2023-12-15 DIAGNOSIS — F32A Depression, unspecified: Secondary | ICD-10-CM

## 2023-12-15 DIAGNOSIS — E1169 Type 2 diabetes mellitus with other specified complication: Secondary | ICD-10-CM

## 2023-12-15 LAB — POCT GLYCOSYLATED HEMOGLOBIN (HGB A1C): HbA1c, POC (controlled diabetic range): 13 % — AB (ref 0.0–7.0)

## 2023-12-15 LAB — GLUCOSE, POCT (MANUAL RESULT ENTRY): POC Glucose: 511 mg/dL — AB (ref 70–99)

## 2023-12-15 MED ORDER — HYDROXYZINE HCL 25 MG PO TABS
25.0000 mg | ORAL_TABLET | Freq: Three times a day (TID) | ORAL | 1 refills | Status: AC | PRN
  Filled 2023-12-15 (×2): qty 60, 20d supply, fill #0

## 2023-12-15 NOTE — Telephone Encounter (Signed)
 At the request of Dr Delbert, I met with the patient while she was in the clinic.  She is currently living in her car which she parks across the street from a friend's house. She said she has been staying in her car for about 3.5 months.  She stated that she can't live with the friend because the friend has bugs in her house.  She said her family is scattered all over; but she is not able to stay with anyone. She mentioned that she has boyfriend who is living with his brother.  She would like to be able to have her own place to live and would like to have her boyfriend live with her.   She receives SSI and her income is $946/month. She said her boyfriend works intermittently. She said she has been everywhere to look for housing. She was not familiar with the [facility] and I told her that I will ask Aloysius Ford, RN/Congregational Nurse to call her to discuss possible options.  I also told her that I will also refer her to [facility] for housing resources.  She then said that she just wants to be  happy.  She said she is not happy now and is trying to cope with a lot of losses. She said her daughter died in a car accident before 11-29-2023.  Her mother is deceased and she is mourning the loss of her mother and she is still grieving the loss of her husband who died in 2021/04/26.  She said multiple times that she is just is not happy now.  She denied and suicidal/homicidal thoughts at this time and I asked her multiple times if she had any thoughts of harming herself and she said no.  I offered to refer her for grief counseling through [facility] and she declined. She said she doesn't want grief counseling, she just wants to talk to someone  She was in agreement to placing a referral to [facility] for SW intervention/counseling.  I also told her to please call me if she has any questions/concerns.  I confirmed her phone number and the address that is listed is her friend's address.

## 2023-12-15 NOTE — Progress Notes (Signed)
 Subjective:  Patient ID: Sarah Charles, female    DOB: Jan 26, 1961  Age: 63 y.o. MRN: [id number]  CC: Medical Management of Chronic Issues     Discussed the use of AI scribe software for clinical note transcription with the patient, who gave verbal consent to proceed.  History of Present Illness Sarah Charles is a 63 y.o. year old female with a history of ype 2 diabetes mellitus , hypertension, hyperlipidemia, GERD, CAD (s/p DES)., Nicotine  dependence.   She has not been adherent with her medications but then lost her daughter in a car wreck on 11/25/2022 which further worsened her nonadherence.  She complains of loss of multiple family members including her mother earlier in the year and this has her depressed.  She breaks down crying.  She denies suicidal intent but states she is not there yet.She lost her husband a couple of years back.  She currently has no housing as she does have a friend who she could stay with but that friend has bugs in the house and so she has chosen to live in her car.  This has caused her not to take her medications and her A1c is 13.0 up from 8.5 previously.  She sometimes takes her antihypertensive when another time does not same for her statin.  Past Medical History:  Diagnosis Date   Arthritis    Coronary artery calcification seen on CT scan    a. 07/2017 noted on CT abd.   Depression    GERD (gastroesophageal reflux disease)    Hyperlipidemia    Hypertension    Obesity    Sleep apnea    a. Does not use CPAP cause i dont think I need it anymore.   Tobacco abuse    Type II diabetes mellitus [facility])     Past Surgical History:  Procedure Laterality Date   BIOPSY  12/21/2022   Procedure: BIOPSY;  Surgeon: Saintclair Jasper, MD;  Location: [facility];  Service: Gastroenterology;;   CHOLECYSTECTOMY     CORONARY PRESSURE/FFR STUDY N/A 11/04/2017   Procedure: INTRAVASCULAR PRESSURE WIRE/FFR STUDY;  Surgeon: Swaziland, Peter M, MD;  Location: [facility] INVASIVE CV  LAB;  Service: Cardiovascular;  Laterality: N/A;   CORONARY STENT INTERVENTION N/A 11/04/2017   Procedure: CORONARY STENT INTERVENTION;  Surgeon: Swaziland, Peter M, MD;  Location: [facility] INVASIVE CV LAB;  Service: Cardiovascular;  Laterality: N/A;   ESOPHAGOGASTRODUODENOSCOPY (EGD) WITH PROPOFOL  N/A 12/21/2022   Procedure: ESOPHAGOGASTRODUODENOSCOPY (EGD) WITH PROPOFOL ;  Surgeon: Saintclair Jasper, MD;  Location: [facility];  Service: Gastroenterology;  Laterality: N/A;   INCISION AND DRAINAGE ABSCESS Left 12/22/2012   Procedure: INCISION AND DRAINAGE ABSCESS;  Surgeon: Oneil DELENA Budge, MD;  Location: [facility];  Service: General;  Laterality: Left;   LEFT HEART CATH AND CORONARY ANGIOGRAPHY N/A 11/04/2017   Procedure: LEFT HEART CATH AND CORONARY ANGIOGRAPHY;  Surgeon: Swaziland, Peter M, MD;  Location: [facility] INVASIVE CV LAB;  Service: Cardiovascular;  Laterality: N/A;   TONSILLECTOMY     TUBAL LIGATION      Family History  Problem Relation Age of Onset   Hypertension Mother    CVA Mother    COPD Mother    Heart disease Mother    Kidney Stones Mother    Heart attack Mother        MI in late 73's   Breast cancer Mother    CAD Father    Hypercholesterolemia Father    Heart attack Father  Died @ 65 of MI   Diabetes Sister    Cancer Maternal Uncle    Diabetes Maternal Grandmother    Cancer Maternal Grandfather        lung cancer   Colon cancer Neg Hx    Esophageal cancer Neg Hx    Stomach cancer Neg Hx    Pancreatic cancer Neg Hx     Social History   Socioeconomic History   Marital status: Widowed    Spouse name: Not on file   Number of children: Not on file   Years of education: Not on file   Highest education level: Not on file  Occupational History   Not on file  Tobacco Use   Smoking status: Every Day    Current packs/day: 0.00    Average packs/day: 1 pack/day for 41.0 years (41.0 ttl pk-yrs)    Types: Cigarettes    Start date: 10/06/1977    Last attempt to quit: 10/07/2018     Years since quitting: 5.1   Smokeless tobacco: Never  Vaping Use   Vaping status: Never Used  Substance and Sexual Activity   Alcohol use: No   Drug use: No   Sexual activity: Never    Birth control/protection: None  Other Topics Concern   Not on file  Social History Narrative   Lives in [place] with husband.  Unemployed.  Does not routinely exercise.   Social Drivers of Corporate investment banker Strain: Low Risk  (02/01/2023)   Overall Financial Resource Strain (CARDIA)    Difficulty of Paying Living Expenses: Not hard at all  Food Insecurity: No Food Insecurity (10/20/2023)   Hunger Vital Sign    Worried About Running Out of Food in the Last Year: Never true    Ran Out of Food in the Last Year: Never true  Transportation Needs: No Transportation Needs (10/20/2023)   PRAPARE - Administrator, Civil Service (Medical): No    Lack of Transportation (Non-Medical): No  Physical Activity: Insufficiently Active (02/01/2023)   Exercise Vital Sign    Days of Exercise per Week: 2 days    Minutes of Exercise per Session: 20 min  Stress: No Stress Concern Present (02/01/2023)   [employer] of Occupational Health - Occupational Stress Questionnaire    Feeling of Stress : Not at all  Social Connections: Socially Isolated (02/01/2023)   Social Connection and Isolation Panel    Frequency of Communication with Friends and Family: More than three times a week    Frequency of Social Gatherings with Friends and Family: Three times a week    Attends Religious Services: Never    Active Member of Clubs or Organizations: No    Attends Banker Meetings: Never    Marital Status: Widowed    Allergies  Allergen Reactions   Cymbalta [Duloxetine Hcl] Nausea Only and Other (See Comments)    Nausea, lack of therapeutic effect    Outpatient Medications Prior to Visit  Medication Sig Dispense Refill   aspirin  EC 81 MG tablet Take 1 tablet (81 mg total) by mouth daily.  Swallow whole. 90 tablet 1   Blood Glucose Monitoring Suppl (ACCU-CHEK GUIDE) w/Device KIT Use to check blood sugar 3 times daily. E11.65 1 kit 0   buPROPion  (WELLBUTRIN  SR) 150 MG 12 hr tablet Take 1 tablet (150 mg total) by mouth 2 (two) times daily. 180 tablet 1   carvedilol  (COREG ) 3.125 MG tablet Take 1 tablet (3.125 mg total) by mouth  2 (two) times daily with a meal. 180 tablet 1   cetirizine  (ZYRTEC ) 10 MG tablet TAKE 1 TABLET BY MOUTH EVERY DAY (Patient taking differently: Take 10 mg by mouth daily.) 30 tablet 2   clotrimazole  (LOTRIMIN ) 1 % cream APPLY TO THE AFFECTED AREA(S) beneath BOTH breasts TWICE DAILY (Patient taking differently: Apply 1 Application topically 2 (two) times daily.) 60 g 1   diclofenac  Sodium (VOLTAREN ) 1 % GEL Apply 4 g topically 4 (four) times daily. 100 g 1   diphenhydrAMINE  (BENADRYL ) 25 MG tablet Take 1 tablet (25 mg total) by mouth every 6 (six) hours as needed. 30 tablet 0   fenofibrate  (TRICOR ) 145 MG tablet Take 1 tablet (145 mg total) by mouth every evening. 90 tablet 1   FISH OIL-CHOLECALCIFEROL PO Take by mouth.     FLUoxetine  (PROZAC ) 20 MG capsule Take 1 capsule (20 mg total) by mouth every morning. 90 capsule 1   furosemide  (LASIX ) 20 MG tablet Take 1 tablet (20 mg total) by mouth daily. 90 tablet 1   gabapentin  (NEURONTIN ) 300 MG capsule Take 2 capsules (600 mg total) by mouth 3 (three) times daily. 180 capsule 3   glucose blood (ACCU-CHEK GUIDE TEST) test strip Use to check blood sugar 3 times daily. 100 each 6   insulin  glargine (LANTUS  SOLOSTAR) 100 UNIT/ML Solostar Pen Inject 33 Units into the skin 2 (two) times daily. 45 mL 6   Insulin  Pen Needle (COMFORT EZ PEN NEEDLES) 31G X 5 MM MISC Inject 1 each into the skin in the morning and at bedtime. use as directed 100 each 3   Lancets (ONETOUCH DELICA PLUS LANCET33G) MISC Use to check blood sugar 3 times daily. 100 each 6   Lancets Misc. (ACCU-CHEK FASTCLIX LANCET) KIT USE AS DIRECTED 1 kit 11    lidocaine  (LIDODERM ) 5 % Place ONE PATCH onto THE SKIN daily. REMOVE & Discard PATCH WITHIN 12 hours OR as directed by MD (Patient taking differently: Place 1 patch onto the skin every 12 (twelve) hours.) 30 patch 0   lisinopril  (ZESTRIL ) 40 MG tablet Take 1 tablet (40 mg total) by mouth daily. 90 tablet 1   metFORMIN  (GLUCOPHAGE ) 500 MG tablet Take 2 tablets (1,000 mg total) by mouth 2 (two) times daily. 360 tablet 1   methocarbamol  (ROBAXIN ) 750 MG tablet TAKE 1 TABLET (750 MG TOTAL) BY MOUTH EVERY 8 (EIGHT) HOURS AS NEEDED FOR MUSCLE SPASMS. 90 tablet 2   metoCLOPramide  (REGLAN ) 10 MG tablet Take 1 tablet (10 mg total) by mouth every 8 (eight) hours as needed for nausea and vomiting 40 tablet 0   nitroGLYCERIN  (NITROSTAT ) 0.4 MG SL tablet Place 0.4 mg under the tongue every 5 (five) minutes as needed for chest pain.     nystatin  powder Apply 1 Application topically 3 (three) times daily. 15 g 0   oxyCODONE  (OXY IR/ROXICODONE ) 5 MG immediate release tablet Take 5 mg by mouth every 4 (four) hours as needed for moderate pain.     pantoprazole  (PROTONIX ) 40 MG tablet Take 1 tablet (40 mg total) by mouth 2 (two) times daily. 180 tablet 1   rosuvastatin  (CRESTOR ) 40 MG tablet Take 1 tablet (40 mg total) by mouth every evening. 90 tablet 1   sucralfate  (CARAFATE ) 1 g tablet Take 1 g by mouth 4 (four) times daily -  with meals and at bedtime.     traZODone  (DESYREL ) 50 MG tablet Take 1 tablet (50 mg total) by mouth at bedtime. 90 tablet 1  triamcinolone  cream (KENALOG ) 0.1 % Apply 1 Application topically 2 (two) times daily. 45 g 1   No facility-administered medications prior to visit.     ROS Review of Systems  Constitutional:  Negative for activity change and appetite change.  HENT:  Negative for sinus pressure and sore throat.   Respiratory:  Negative for chest tightness, shortness of breath and wheezing.   Cardiovascular:  Negative for chest pain and palpitations.  Gastrointestinal:  Negative  for abdominal distention, abdominal pain and constipation.  Genitourinary: Negative.   Musculoskeletal: Negative.   Psychiatric/Behavioral:  Positive for dysphoric mood. Negative for behavioral problems.     Objective:  BP 138/71 (BP Location: Left Arm, Patient Position: Sitting, Cuff Size: Normal)   Pulse 77   Wt 183 lb 6.4 oz (83.2 kg)   SpO2 96%   BMI 33.54 kg/m      12/15/2023   10:10 AM 12/15/2023   10:08 AM 12/02/2023   11:45 AM  BP/Weight  Systolic BP 138  853  Diastolic BP 71  83  Wt. (Lbs)  183.4   BMI  33.54 kg/m2       Physical Exam Constitutional:      Appearance: She is well-developed.  Cardiovascular:     Rate and Rhythm: Normal rate.     Heart sounds: Normal heart sounds. No murmur heard. Pulmonary:     Effort: Pulmonary effort is normal.     Breath sounds: Normal breath sounds. No wheezing or rales.  Chest:     Chest wall: No tenderness.  Abdominal:     General: Bowel sounds are normal. There is no distension.     Palpations: Abdomen is soft. There is no mass.     Tenderness: There is no abdominal tenderness.  Musculoskeletal:        General: Normal range of motion.     Right lower leg: No edema.     Left lower leg: No edema.  Neurological:     Mental Status: She is alert and oriented to person, place, and time.  Psychiatric:     Comments: Dysphoric mood        Latest Ref Rng & Units 11/12/2023    1:55 PM 02/05/2023    5:19 AM 02/04/2023    4:48 PM  CMP  Glucose 70 - 99 mg/dL 549  894    BUN 8 - 23 mg/dL 15  6    Creatinine 9.55 - 1.00 mg/dL 8.93  9.57    Sodium 864 - 145 mmol/L 135  138    Potassium 3.5 - 5.1 mmol/L 3.6  3.7  3.5   Chloride 98 - 111 mmol/L 100  107    CO2 22 - 32 mmol/L 25  22    Calcium  8.9 - 10.3 mg/dL 9.5  8.3    Total Protein 6.5 - 8.1 g/dL 6.5     Total Bilirubin 0.0 - 1.2 mg/dL 0.2     Alkaline Phos 38 - 126 U/L 76     AST 15 - 41 U/L 15     ALT 0 - 44 U/L 15       Lipid Panel     Component Value Date/Time    CHOL 137 11/12/2021 1018   TRIG 102 11/12/2021 1018   HDL 54 11/12/2021 1018   CHOLHDL 3.9 08/14/2020 1124   CHOLHDL 9.7 (H) 07/23/2016 1638   VLDL NOT CALC 07/23/2016 1638   LDLCALC 64 11/12/2021 1018    CBC    Component  Value Date/Time   WBC 5.4 11/12/2023 1355   RBC 4.54 11/12/2023 1355   HGB 13.7 11/12/2023 1355   HGB 14.2 11/09/2018 1158   HCT 39.9 11/12/2023 1355   HCT 42.8 11/09/2018 1158   PLT 218 11/12/2023 1355   PLT 321 11/09/2018 1158   MCV 87.9 11/12/2023 1355   MCV 93 11/09/2018 1158   MCH 30.2 11/12/2023 1355   MCHC 34.3 11/12/2023 1355   RDW 13.0 11/12/2023 1355   RDW 12.5 11/09/2018 1158   LYMPHSABS 1.0 02/03/2023 0406   LYMPHSABS 2.0 11/09/2018 1158   MONOABS 0.6 02/03/2023 0406   EOSABS 0.0 02/03/2023 0406   EOSABS 0.1 11/09/2018 1158   BASOSABS 0.0 02/03/2023 0406   BASOSABS 0.1 11/09/2018 1158    Lab Results  Component Value Date   HGBA1C 13.0 (A) 12/15/2023    Lab Results  Component Value Date   HGBA1C 13.0 (A) 12/15/2023   HGBA1C 8.5 (A) 09/15/2023   HGBA1C 8.2 (A) 05/16/2023       Assessment & Plan  1. Type 2 diabetes mellitus with hyperlipidemia (HCC) (Primary) Uncontrolled with A1c of 13.0 Underlying bereavement and grief playing a huge role Advised to restart medications and we have discussed complications of uncontrolled diabetes mellitus including cardiovascular risk and death - POCT glycosylated hemoglobin (Hb A1C) - POCT glucose (manual entry)  2. Bereavement Underlying depression with superimposed bereavement I have placed her on hydroxyzine , continue Prozac  Case manager called in to assist with referral to [facility] as she will benefit from counseling  3. Nonadherence to medication S DOH barriers contributing in addition to bereavement Case manager will assist in connecting her to resources  4. Diabetic polyneuropathy associated with type 2 diabetes mellitus (HCC) Nonadherent with medications and she has been advised  to restart medications  5. Hyperlipidemia associated with type 2 diabetes mellitus (HCC) She is overdue for lipid panel Will need to order this when she is fasting Advised to restart statin Low cholesterol diet  6. Coronary artery disease involving native coronary artery of native heart without angina pectoris Post DES Advised that medication adherence is imperative to prevent secondary event Continue statin  7. Hypertension associated with diabetes (HCC) Controlled Continue antihypertensives Counseled on blood pressure goal of less than 130/80, low-sodium, DASH diet, medication compliance, 150 minutes of moderate intensity exercise per week. Discussed medication compliance, adverse effects.     Meds ordered this encounter  Medications   hydrOXYzine  (ATARAX ) 25 MG tablet    Sig: Take 1 tablet (25 mg total) by mouth 3 (three) times daily as needed. For anxiety    Dispense:  60 tablet    Refill:  1    Follow-up: Return in about 1 month (around 01/15/2024) for Diabetes follow-up with PCP.       Corrina Sabin, MD, FAAFP. [facility] [place], [place] [phone]   12/15/2023, 1:24 PM

## 2023-12-15 NOTE — Patient Instructions (Signed)
 Managing Loss, Adult  People experience loss in many different ways throughout their lives. Events such as moving, changing jobs, and losing friends can create a sense of loss. The loss may be as serious as a major health change, divorce, death of a pet, or death of a loved one. All of these types of loss are likely to create a physical and emotional reaction known as grief. Grief is the result of a major change or an absence of something or someone that you count on. Grief is a normal reaction to loss.  A variety of factors can affect your grieving experience, including:  The nature of your loss.  Your relationship to what or whom you lost.  Your understanding of grief and how to manage it.  Your support system.  Be aware that when grief becomes extreme, it can lead to more severe issues like isolation, depression, anxiety, or suicidal thoughts. Talk with your health care provider if you have any of these issues.  How to manage lifestyle changes    Keep to your normal routine as much as possible.  If you have trouble focusing or doing normal activities, it is acceptable to take some time away from your normal routine.  Spend time with friends and loved ones.  Eat a healthy diet, get plenty of sleep, and rest when you feel tired.  How to recognize changes   The way that you deal with your grief will affect your ability to function as you normally do. When grieving, you may experience these changes:  Numbness, shock, sadness, anxiety, anger, denial, and guilt.  Thoughts about death.  Unexpected crying.  A physical sensation of emptiness in your stomach.  Problems sleeping and eating.  Tiredness (fatigue).  Loss of interest in normal activities.  Dreaming about or imagining seeing the person who died.  A need to remember what or whom you lost.  Difficulty thinking about anything other than your loss for a period of time.  Relief. If you have been expecting the loss for a while, you may feel a sense of relief when it  happens.  Follow these instructions at home:  Activity    Express your feelings in healthy ways, such as:  Talking with others about your loss. It may be helpful to find others who have had a similar loss, such as a support group.  Writing down your feelings in a journal.  Doing physical activities to release stress and emotional energy.  Doing creative activities like painting, sculpting, or playing or listening to music.  Practicing resilience. This is the ability to recover and adjust after facing challenges. Reading some resources that encourage resilience may help you to learn ways to practice those behaviors.     General instructions  Be patient with yourself and others. Allow the grieving process to happen, and remember that grieving takes time.  It is likely that you may never feel completely done with some grief. You may find a way to move on while still cherishing memories and feelings about your loss.  Accepting your loss is a process. It can take months or longer to adjust.  Keep all follow-up visits. This is important.  Where to find support  To get support for managing loss:  Ask your health care provider for help and recommendations, such as grief counseling or therapy.  Think about joining a support group for people who are managing a loss.  Where to find more information  You can find more information  about managing loss from:  American Society of Clinical Oncology: www.cancer.net  American Psychological Association: [employer]  Contact a health care provider if:  Your grief is extreme and keeps getting worse.  You have ongoing grief that does not improve.  Your body shows symptoms of grief, such as illness.  You feel depressed, anxious, or hopeless.  Get help right away if:  You have thoughts about hurting yourself or others.  Get help right away if you feel like you may hurt yourself or others, or have thoughts about taking your own life. Go to your nearest emergency room or:  Call 911.  Call the  National Suicide Prevention Lifeline at 985-787-5965 or 988. This is open 24 hours a day.  Text the Crisis Text Line at (308)798-1395.  Summary  Grief is the result of a major change or an absence of someone or something that you count on. Grief is a normal reaction to loss.  The depth of grief and the period of recovery depend on the type of loss and your ability to adjust to the change and process your feelings.  Processing grief requires patience and a willingness to accept your feelings and talk about your loss with people who are supportive.  It is important to find resources that work for you and to realize that people experience grief differently. There is not one grieving process that works for everyone in the same way.  Be aware that when grief becomes extreme, it can lead to more severe issues like isolation, depression, anxiety, or suicidal thoughts. Talk with your health care provider if you have any of these issues.  This information is not intended to replace advice given to you by your health care provider. Make sure you discuss any questions you have with your health care provider.  Document Revised: 12/29/2020 Document Reviewed: 12/29/2020  Elsevier Patient Education  2024 [employer].

## 2023-12-16 ENCOUNTER — Telehealth: Payer: Self-pay | Admitting: Family Medicine

## 2023-12-16 NOTE — Telephone Encounter (Signed)
 Copied from CRM [id number]. Topic: General - Other >> Dec 16, 2023  8:39 AM Sarah Charles wrote:  Patient says she is suppose to speak with a case manager today please have them call her

## 2023-12-16 NOTE — Telephone Encounter (Signed)
 Call to patient unable to reach VM left

## 2023-12-18 ENCOUNTER — Encounter (HOSPITAL_COMMUNITY): Payer: Self-pay

## 2023-12-18 ENCOUNTER — Emergency Department (HOSPITAL_COMMUNITY): Admission: EM | Admit: 2023-12-18 | Discharge: 2023-12-18 | Disposition: A | Discharge status: Home / Self Care

## 2023-12-18 DIAGNOSIS — Z7982 Long term (current) use of aspirin: Secondary | ICD-10-CM | POA: Diagnosis not present

## 2023-12-18 DIAGNOSIS — E871 Hypo-osmolality and hyponatremia: Secondary | ICD-10-CM | POA: Diagnosis not present

## 2023-12-18 DIAGNOSIS — Z794 Long term (current) use of insulin: Secondary | ICD-10-CM | POA: Insufficient documentation

## 2023-12-18 DIAGNOSIS — B379 Candidiasis, unspecified: Secondary | ICD-10-CM | POA: Diagnosis not present

## 2023-12-18 DIAGNOSIS — R300 Dysuria: Secondary | ICD-10-CM | POA: Diagnosis present

## 2023-12-18 LAB — CBC WITH DIFFERENTIAL/PLATELET
Abs Immature Granulocytes: 0.02 K/uL (ref 0.00–0.07)
Basophils Absolute: 0.1 K/uL (ref 0.0–0.1)
Basophils Relative: 1 %
Eosinophils Absolute: 0.1 K/uL (ref 0.0–0.5)
Eosinophils Relative: 2 %
HCT: 40.8 % (ref 36.0–46.0)
Hemoglobin: 13.9 g/dL (ref 12.0–15.0)
Immature Granulocytes: 0 %
Lymphocytes Relative: 26 %
Lymphs Abs: 1.5 K/uL (ref 0.7–4.0)
MCH: 30.2 pg (ref 26.0–34.0)
MCHC: 34.1 g/dL (ref 30.0–36.0)
MCV: 88.5 fL (ref 80.0–100.0)
Monocytes Absolute: 0.4 K/uL (ref 0.1–1.0)
Monocytes Relative: 7 %
Neutro Abs: 3.8 K/uL (ref 1.7–7.7)
Neutrophils Relative %: 64 %
Platelets: 219 K/uL (ref 150–400)
RBC: 4.61 MIL/uL (ref 3.87–5.11)
RDW: 12.6 % (ref 11.5–15.5)
WBC: 5.9 K/uL (ref 4.0–10.5)
nRBC: 0 % (ref 0.0–0.2)

## 2023-12-18 LAB — BASIC METABOLIC PANEL WITH GFR
Anion gap: 10 (ref 5–15)
BUN: 15 mg/dL (ref 8–23)
CO2: 25 mmol/L (ref 22–32)
Calcium: 9.7 mg/dL (ref 8.9–10.3)
Chloride: 97 mmol/L — ABNORMAL LOW (ref 98–111)
Creatinine, Ser: 0.71 mg/dL (ref 0.44–1.00)
GFR, Estimated: >60 mL/min (ref >60)
Glucose, Bld: 409 mg/dL — ABNORMAL HIGH (ref 70–99)
Potassium: 4.3 mmol/L (ref 3.5–5.1)
Sodium: 132 mmol/L — ABNORMAL LOW (ref 135–145)

## 2023-12-18 LAB — URINALYSIS, W/ REFLEX TO CULTURE (INFECTION SUSPECTED)
Bacteria, UA: NONE SEEN
Bilirubin Urine: NEGATIVE
Glucose, UA: >=500 mg/dL — AB
Hgb urine dipstick: NEGATIVE
Ketones, ur: 5 mg/dL — AB
Nitrite: NEGATIVE
Protein, ur: NEGATIVE mg/dL
Specific Gravity, Urine: 1.032 — ABNORMAL HIGH (ref 1.005–1.030)
pH: 6 (ref 5.0–8.0)

## 2023-12-18 LAB — WET PREP, GENITAL
Clue Cells Wet Prep HPF POC: NONE SEEN
Sperm: NONE SEEN
Trich, Wet Prep: NONE SEEN
WBC, Wet Prep HPF POC: <10 (ref <10)

## 2023-12-18 MED ORDER — FLUCONAZOLE 200 MG PO TABS
200.0000 mg | ORAL_TABLET | Freq: Once | ORAL | Status: AC
Start: 2023-12-18 — End: 2023-12-18
  Administered 2023-12-18: 200 mg via ORAL
  Filled 2023-12-18: qty 1

## 2023-12-18 MED ORDER — FLUCONAZOLE 200 MG PO TABS: 200.0000 mg | ORAL_TABLET | Freq: Every day | ORAL | 0 refills | Status: AC

## 2023-12-18 NOTE — ED Provider Notes (Signed)
 [name] Provider Note   CSN: [id number] Arrival date & time: 12/18/23  1108     Patient presents with: Dysuria and Urinary Frequency   Sarah Charles is a 63 y.o. female presents today for urinary frequency, urgency, and dysuria x 3 weeks.  Patient has been seen twice by urgent care and completed a course of Keflex  after urine culture on 7/18.  Patient denies fever, chills, nausea, vomiting, suprapubic pain, diarrhea, vaginal discharge, chest pain, shortness of breath, flank pain any other complaints at this time.    Dysuria Urinary Frequency       Prior to Admission medications   Medication Sig Start Date End Date Taking? Authorizing Provider  fluconazole  (DIFLUCAN ) 200 MG tablet Take 1 tablet (200 mg total) by mouth daily for 1 day. 3 days after first dose if still having symptoms. 12/18/23 12/19/23 Yes Francis Ileana SAILOR, PA-C  aspirin  EC 81 MG tablet Take 1 tablet (81 mg total) by mouth daily. Swallow whole. 08/25/23   Newlin, Enobong, MD  Blood Glucose Monitoring Suppl (ACCU-CHEK GUIDE) w/Device KIT Use to check blood sugar 3 times daily. E11.65 10/03/23   Newlin, Enobong, MD  buPROPion  (WELLBUTRIN  SR) 150 MG 12 hr tablet Take 1 tablet (150 mg total) by mouth 2 (two) times daily. 07/21/23   Newlin, Enobong, MD  carvedilol  (COREG ) 3.125 MG tablet Take 1 tablet (3.125 mg total) by mouth 2 (two) times daily with a meal. 07/21/23   Delbert Clam, MD  cetirizine  (ZYRTEC ) 10 MG tablet TAKE 1 TABLET BY MOUTH EVERY DAY Patient taking differently: Take 10 mg by mouth daily. 08/02/19   Newlin, Enobong, MD  clotrimazole  (LOTRIMIN ) 1 % cream APPLY TO THE AFFECTED AREA(S) beneath BOTH breasts TWICE DAILY Patient taking differently: Apply 1 Application topically 2 (two) times daily. 03/08/22   Newlin, Enobong, MD  diclofenac  Sodium (VOLTAREN ) 1 % GEL Apply 4 g topically 4 (four) times daily. 05/05/21   Newlin, Enobong, MD  diphenhydrAMINE  (BENADRYL )  25 MG tablet Take 1 tablet (25 mg total) by mouth every 6 (six) hours as needed. 05/01/23   Dreama, [place]  N, FNP  fenofibrate  (TRICOR ) 145 MG tablet Take 1 tablet (145 mg total) by mouth every evening. 07/21/23   Newlin, Enobong, MD  FISH OIL-CHOLECALCIFEROL PO Take by mouth.    [provider]  FLUoxetine  (PROZAC ) 20 MG capsule Take 1 capsule (20 mg total) by mouth every morning. 07/21/23   Newlin, Enobong, MD  furosemide  (LASIX ) 20 MG tablet Take 1 tablet (20 mg total) by mouth daily. 07/21/23   Newlin, Enobong, MD  gabapentin  (NEURONTIN ) 300 MG capsule Take 2 capsules (600 mg total) by mouth 3 (three) times daily. 11/02/23   Newlin, Enobong, MD  glucose blood (ACCU-CHEK GUIDE TEST) test strip Use to check blood sugar 3 times daily. 08/23/23   Newlin, Enobong, MD  hydrOXYzine  (ATARAX ) 25 MG tablet Take 1 tablet (25 mg total) by mouth 3 (three) times daily as needed. For anxiety 12/15/23   Newlin, Enobong, MD  insulin  glargine (LANTUS  SOLOSTAR) 100 UNIT/ML Solostar Pen Inject 33 Units into the skin 2 (two) times daily. 07/21/23   Newlin, Enobong, MD  Insulin  Pen Needle (COMFORT EZ PEN NEEDLES) 31G X 5 MM MISC Inject 1 each into the skin in the morning and at bedtime. use as directed 07/21/23   Newlin, Enobong, MD  Lancets [facility] DELICA PLUS LANCET33G) MISC Use to check blood sugar 3 times daily. 08/23/23   Newlin,  Enobong, MD  Lancets Misc. (ACCU-CHEK FASTCLIX LANCET) KIT USE AS DIRECTED 11/12/21   Newlin, Enobong, MD  lidocaine  (LIDODERM ) 5 % Place ONE PATCH onto THE SKIN daily. REMOVE & Discard PATCH WITHIN 12 hours OR as directed by MD Patient taking differently: Place 1 patch onto the skin every 12 (twelve) hours. 12/30/22   Newlin, Enobong, MD  lisinopril  (ZESTRIL ) 40 MG tablet Take 1 tablet (40 mg total) by mouth daily. 07/21/23   Newlin, Enobong, MD  metFORMIN  (GLUCOPHAGE ) 500 MG tablet Take 2 tablets (1,000 mg total) by mouth 2 (two) times daily. 07/21/23   Newlin, Enobong, MD  methocarbamol   (ROBAXIN ) 750 MG tablet TAKE 1 TABLET (750 MG TOTAL) BY MOUTH EVERY 8 (EIGHT) HOURS AS NEEDED FOR MUSCLE SPASMS. 10/12/23   Newlin, Enobong, MD  metoCLOPramide  (REGLAN ) 10 MG tablet Take 1 tablet (10 mg total) by mouth every 8 (eight) hours as needed for nausea and vomiting 07/21/23   Newlin, Enobong, MD  nitroGLYCERIN  (NITROSTAT ) 0.4 MG SL tablet Place 0.4 mg under the tongue every 5 (five) minutes as needed for chest pain.    [provider]  nystatin  powder Apply 1 Application topically 3 (three) times daily. 11/12/23   Johnie Flaming A, NP  oxyCODONE  (OXY IR/ROXICODONE ) 5 MG immediate release tablet Take 5 mg by mouth every 4 (four) hours as needed for moderate pain. 11/29/22   [provider]  pantoprazole  (PROTONIX ) 40 MG tablet Take 1 tablet (40 mg total) by mouth 2 (two) times daily. 07/21/23   Newlin, Enobong, MD  rosuvastatin  (CRESTOR ) 40 MG tablet Take 1 tablet (40 mg total) by mouth every evening. 07/21/23   Newlin, Enobong, MD  sucralfate  (CARAFATE ) 1 g tablet Take 1 g by mouth 4 (four) times daily -  with meals and at bedtime.    [provider]  traZODone  (DESYREL ) 50 MG tablet Take 1 tablet (50 mg total) by mouth at bedtime. 07/21/23   Newlin, Enobong, MD  triamcinolone  cream (KENALOG ) 0.1 % Apply 1 Application topically 2 (two) times daily. 07/21/23   Newlin, Enobong, MD    Allergies: Cymbalta [duloxetine hcl]    Review of Systems  Genitourinary:  Positive for dysuria, frequency and urgency.    Updated Vital Signs BP 138/70 (BP Location: Left Arm)   Pulse 74   Temp (!) 97.5 F (36.4 C) (Oral)   Resp 18   Ht 5' 2 (1.575 m)   Wt 83 kg   SpO2 99%   BMI 33.47 kg/m   Physical Exam Vitals and nursing note reviewed.  Constitutional:      General: She is not in acute distress.    Appearance: Normal appearance. She is well-developed. She is not ill-appearing.  HENT:     Head: Normocephalic and atraumatic.     Right Ear: External ear normal.     Left  Ear: External ear normal.     Nose: Nose normal.  Eyes:     Extraocular Movements: Extraocular movements intact.     Conjunctiva/sclera: Conjunctivae normal.  Cardiovascular:     Rate and Rhythm: Normal rate and regular rhythm.     Pulses: Normal pulses.     Heart sounds: Normal heart sounds. No murmur heard. Pulmonary:     Effort: Pulmonary effort is normal. No respiratory distress.     Breath sounds: Normal breath sounds.  Abdominal:     Palpations: Abdomen is soft.     Tenderness: There is no abdominal tenderness.  Musculoskeletal:  General: No swelling.     Cervical back: Normal range of motion and neck supple.  Skin:    General: Skin is warm and dry.     Capillary Refill: Capillary refill takes less than 2 seconds.  Neurological:     General: No focal deficit present.     Mental Status: She is alert and oriented to person, place, and time.  Psychiatric:        Mood and Affect: Mood normal.     (all labs ordered are listed, but only abnormal results are displayed) Labs Reviewed  WET PREP, GENITAL - Abnormal; Notable for the following components:      Result Value   Yeast Wet Prep HPF POC PRESENT (*)    All other components within normal limits  BASIC METABOLIC PANEL WITH GFR - Abnormal; Notable for the following components:   Sodium 132 (*)    Chloride 97 (*)    Glucose, Bld 409 (*)    All other components within normal limits  URINALYSIS, W/ REFLEX TO CULTURE (INFECTION SUSPECTED) - Abnormal; Notable for the following components:   Specific Gravity, Urine 1.032 (*)    Glucose, UA >=500 (*)    Ketones, ur 5 (*)    Leukocytes,Ua SMALL (*)    All other components within normal limits  CBC WITH DIFFERENTIAL/PLATELET    EKG: None  Radiology: No results found.   Procedures   Medications Ordered in the ED  fluconazole  (DIFLUCAN ) tablet 200 mg (has no administration in time range)                                    Medical Decision Making Amount  and/or Complexity of Data Reviewed Labs: ordered.   This patient presents to the ED for concern of dysuria differential diagnosis includes Candida infection, UTI,  pyelonephritis, trichomonas, bacterial vaginosis  Additional history obtained   Additional history obtained from Electronic Medical Record External records from outside source obtained and reviewed including internal medicine notes   Lab Tests:  I Ordered, and personally interpreted labs.  The pertinent results include: CBC WNL, CMP with hyperglycemia 409, hyponatremia 132 corrected to 137 in the setting of hyperglycemia, no anion gap, yeast on wet prep, UA with greater than 500 glucose, 5 ketones, small leukocytes, elevated specific gravity   Medicines ordered and prescription drug management:  I ordered medication including Diflucan     I have reviewed the patients home medicines and have made adjustments as needed   Problem List / ED Course:  Considered for admission further workup however patient's vital signs, physical exam, labs, and imaging are reassuring.  Patient symptoms likely due to candidiasis.  Patient given Diflucan  while in ED and second dose outpatient if still symptomatic in 72 hours.  Patient advised to follow-up with her PCP if her symptoms persist for further evaluation workup.  Patient given return precautions.  I feel patient is safe for discharge at this time.        Final diagnoses:  Candidiasis    ED Discharge Orders          Ordered    fluconazole  (DIFLUCAN ) 200 MG tablet  Daily        12/18/23 1324               Francis Ileana SAILOR, PA-C 12/18/23 1324    Neysa Caron PARAS, DO 12/18/23 [phone]

## 2023-12-18 NOTE — ED Notes (Signed)
 This RN reviewed discharge instructions with patient. She verbalized understanding and denied any further questions. PT well appearing upon discharge and reports tolerable pain. Pt ambulated with stable gait to exit. Pt endorses ride home.

## 2023-12-18 NOTE — ED Triage Notes (Signed)
 Pt c/o urinary frequency and burning with urination x 3 weeks, reports currently on abx with little relief.

## 2023-12-18 NOTE — Discharge Instructions (Addendum)
 Today you were seen for a yeast infection.  Please pick up your medication and take in 72 hours if you are still having symptoms.  Please follow-up with your PCP for further evaluation and workup.  Please return to the ED if you have uncontrollable vomiting, fever that does not go down with Tylenol  or Motrin , or severe abdominal pain.  Thank you for letting us  treat you today. After reviewing your labs, I feel you are safe to go home. Please follow up with your PCP in the next several days and provide them with your records from this visit. Return to the Emergency Room if pain becomes severe or symptoms worsen.

## 2023-12-19 ENCOUNTER — Other Ambulatory Visit (HOSPITAL_COMMUNITY): Payer: Self-pay

## 2023-12-19 NOTE — Telephone Encounter (Addendum)
 Patient returning call back. Call transferred to [facility] for further assistance.

## 2023-12-19 NOTE — Telephone Encounter (Signed)
 The patient called back and she was inquiring when she was going to be receiving a call from the SW. I explained to her that I placed the referral to [facility] last week after we spoke and they should be contacting her this week. She said she understood and I told her that she can call me back with any questions and she can call me tomorrow if she has not heard from the SW yet.

## 2023-12-21 ENCOUNTER — Telehealth: Payer: Self-pay

## 2023-12-21 ENCOUNTER — Encounter (HOSPITAL_COMMUNITY): Payer: Self-pay

## 2023-12-21 ENCOUNTER — Other Ambulatory Visit (HOSPITAL_COMMUNITY): Payer: Self-pay

## 2023-12-21 ENCOUNTER — Emergency Department (HOSPITAL_COMMUNITY)

## 2023-12-21 ENCOUNTER — Other Ambulatory Visit: Payer: Self-pay

## 2023-12-21 ENCOUNTER — Emergency Department (HOSPITAL_COMMUNITY)
Admission: EM | Admit: 2023-12-21 | Discharge: 2023-12-22 | Disposition: A | Discharge status: Home / Self Care | Attending: Student | Admitting: Student

## 2023-12-21 DIAGNOSIS — Z7984 Long term (current) use of oral hypoglycemic drugs: Secondary | ICD-10-CM | POA: Diagnosis not present

## 2023-12-21 DIAGNOSIS — R0789 Other chest pain: Secondary | ICD-10-CM | POA: Insufficient documentation

## 2023-12-21 DIAGNOSIS — Z794 Long term (current) use of insulin: Secondary | ICD-10-CM | POA: Diagnosis not present

## 2023-12-21 DIAGNOSIS — R079 Chest pain, unspecified: Secondary | ICD-10-CM | POA: Diagnosis not present

## 2023-12-21 DIAGNOSIS — I10 Essential (primary) hypertension: Secondary | ICD-10-CM | POA: Diagnosis not present

## 2023-12-21 DIAGNOSIS — Z7982 Long term (current) use of aspirin: Secondary | ICD-10-CM | POA: Diagnosis not present

## 2023-12-21 DIAGNOSIS — F17200 Nicotine dependence, unspecified, uncomplicated: Secondary | ICD-10-CM | POA: Diagnosis not present

## 2023-12-21 DIAGNOSIS — R231 Pallor: Secondary | ICD-10-CM | POA: Diagnosis not present

## 2023-12-21 DIAGNOSIS — E1165 Type 2 diabetes mellitus with hyperglycemia: Secondary | ICD-10-CM | POA: Insufficient documentation

## 2023-12-21 DIAGNOSIS — I2510 Atherosclerotic heart disease of native coronary artery without angina pectoris: Secondary | ICD-10-CM | POA: Diagnosis not present

## 2023-12-21 DIAGNOSIS — Z79899 Other long term (current) drug therapy: Secondary | ICD-10-CM | POA: Diagnosis not present

## 2023-12-21 DIAGNOSIS — R7989 Other specified abnormal findings of blood chemistry: Secondary | ICD-10-CM | POA: Diagnosis not present

## 2023-12-21 DIAGNOSIS — I700 Atherosclerosis of aorta: Secondary | ICD-10-CM | POA: Diagnosis not present

## 2023-12-21 LAB — CBC
HCT: 40.7 % (ref 36.0–46.0)
Hemoglobin: 14.2 g/dL (ref 12.0–15.0)
MCH: 30.4 pg (ref 26.0–34.0)
MCHC: 34.9 g/dL (ref 30.0–36.0)
MCV: 87.2 fL (ref 80.0–100.0)
Platelets: 252 K/uL (ref 150–400)
RBC: 4.67 MIL/uL (ref 3.87–5.11)
RDW: 12.3 % (ref 11.5–15.5)
WBC: 6.5 K/uL (ref 4.0–10.5)
nRBC: 0 % (ref 0.0–0.2)

## 2023-12-21 LAB — CBG MONITORING, ED
Glucose-Capillary: 462 mg/dL — ABNORMAL HIGH (ref 70–99)
Glucose-Capillary: 337 mg/dL — ABNORMAL HIGH (ref 70–99)

## 2023-12-21 LAB — BASIC METABOLIC PANEL WITH GFR
Anion gap: 11 (ref 5–15)
BUN: 22 mg/dL (ref 8–23)
CO2: 24 mmol/L (ref 22–32)
Calcium: 9.3 mg/dL (ref 8.9–10.3)
Chloride: 95 mmol/L — ABNORMAL LOW (ref 98–111)
Creatinine, Ser: 0.77 mg/dL (ref 0.44–1.00)
GFR, Estimated: >60 mL/min (ref >60)
Glucose, Bld: 513 mg/dL (ref 70–99)
Potassium: 4.3 mmol/L (ref 3.5–5.1)
Sodium: 130 mmol/L — ABNORMAL LOW (ref 135–145)

## 2023-12-21 LAB — I-STAT VENOUS BLOOD GAS, ED
Acid-Base Excess: 1 mmol/L (ref 0.0–2.0)
Bicarbonate: 25.4 mmol/L (ref 20.0–28.0)
Calcium, Ion: 1.18 mmol/L (ref 1.15–1.40)
HCT: 39 % (ref 36.0–46.0)
Hemoglobin: 13.3 g/dL (ref 12.0–15.0)
O2 Saturation: 98 %
Potassium: 4.4 mmol/L (ref 3.5–5.1)
Sodium: 130 mmol/L — ABNORMAL LOW (ref 135–145)
TCO2: 27 mmol/L (ref 22–32)
pCO2, Ven: 39.9 mmHg — ABNORMAL LOW (ref 44–60)
pH, Ven: 7.412 (ref 7.25–7.43)
pO2, Ven: 98 mmHg — ABNORMAL HIGH (ref 32–45)

## 2023-12-21 LAB — TROPONIN I (HIGH SENSITIVITY)
Troponin I (High Sensitivity): 6 ng/L (ref <18)
Troponin I (High Sensitivity): 7 ng/L (ref <18)

## 2023-12-21 MED ORDER — ACETAMINOPHEN 325 MG PO TABS
650.0000 mg | ORAL_TABLET | Freq: Once | ORAL | Status: AC
Start: 2023-12-21 — End: 2023-12-21
  Administered 2023-12-21: 650 mg via ORAL
  Filled 2023-12-21: qty 2

## 2023-12-21 MED ORDER — ALUM & MAG HYDROXIDE-SIMETH 200-200-20 MG/5ML PO SUSP
30.0000 mL | Freq: Once | ORAL | Status: AC
  Administered 2023-12-21: 30 mL via ORAL
  Filled 2023-12-21: qty 30

## 2023-12-21 MED ORDER — SODIUM CHLORIDE 0.9 % IV BOLUS
1000.0000 mL | Freq: Once | INTRAVENOUS | Status: AC
  Administered 2023-12-21: 1000 mL via INTRAVENOUS

## 2023-12-21 MED ORDER — LIDOCAINE VISCOUS HCL 2 % MT SOLN
15.0000 mL | Freq: Once | OROMUCOSAL | Status: AC
  Administered 2023-12-21: 15 mL via ORAL
  Filled 2023-12-21: qty 15

## 2023-12-21 MED ORDER — INSULIN ASPART 100 UNIT/ML IJ SOLN
10.0000 [IU] | Freq: Once | INTRAMUSCULAR | Status: AC
  Administered 2023-12-21: 10 [IU] via SUBCUTANEOUS

## 2023-12-21 NOTE — Progress Notes (Signed)
 Complex Care Management Note Care Guide Note  12/21/2023 Name: Sarah Charles MRN: [id number] DOB: 1960-08-30   Complex Care Management Outreach Attempts: An unsuccessful telephone outreach was attempted today to offer the patient information about available complex care management services.  Follow Up Plan:  Additional outreach attempts will be made to offer the patient complex care management information and services.   Encounter Outcome:  No Answer  Dreama [facility]  [facility], [facility] Care Management Assistant Direct Dial: [phone]  Fax: [phone]

## 2023-12-21 NOTE — ED Notes (Signed)
 Patient transported to X-ray

## 2023-12-21 NOTE — ED Triage Notes (Signed)
 Pt bib [facility] coming from home with CC of non-radiating, left sided chest pain that began today around noon. Pt does reports shortness of breath. Respirations regular and unlabored during triage. Pt denies n/v/abdominal pain at this time. GCS 15.   EMS VS: 126/92 74 HR 97% RA 97.3 temp 324 aspirin 

## 2023-12-21 NOTE — Discharge Instructions (Signed)
 Your CT did not show any findings of blood clot or pneumonia.  There is a small pulmonary nodule that will just need follow-up.   Recommend to follow-up with your primary care doctor.  They can arrange follow-up on the nodule as well. Return here for new concerns.

## 2023-12-21 NOTE — ED Provider Notes (Signed)
 [facility] Provider Note   CSN: [id number] Arrival date & time: 12/21/23  8059     Patient presents with: Chest Pain   Sarah Charles is a 63 y.o. female with past medical history of polysubstance abuse, insulin -dependent T2DM, HTN, HLD, CAD presents to emergency department for evaluation of left-sided chest pain that started 12:00 today.  Describes pain as [place] and throbbing.  Pain has been waxing and waning up to 9/10 in intensity.  Has associated shortness of breath when chest pain occurs.  Unsure if exertional as she does not typically walk.  Denies nausea, radiation of pain, abdominal pain, cough, congestion, pedal edema.  {Add pertinent medical, surgical, social history, OB history to HPI:[id number]}  Chest Pain      Prior to Admission medications   Medication Sig Start Date End Date Taking? Authorizing Provider  aspirin  EC 81 MG tablet Take 1 tablet (81 mg total) by mouth daily. Swallow whole. 08/25/23   Newlin, Enobong, MD  Blood Glucose Monitoring Suppl (ACCU-CHEK GUIDE) w/Device KIT Use to check blood sugar 3 times daily. E11.65 10/03/23   Newlin, Enobong, MD  buPROPion  (WELLBUTRIN  SR) 150 MG 12 hr tablet Take 1 tablet (150 mg total) by mouth 2 (two) times daily. 07/21/23   Newlin, Enobong, MD  carvedilol  (COREG ) 3.125 MG tablet Take 1 tablet (3.125 mg total) by mouth 2 (two) times daily with a meal. 07/21/23   Delbert Clam, MD  cetirizine  (ZYRTEC ) 10 MG tablet TAKE 1 TABLET BY MOUTH EVERY DAY Patient taking differently: Take 10 mg by mouth daily. 08/02/19   Newlin, Enobong, MD  clotrimazole  (LOTRIMIN ) 1 % cream APPLY TO THE AFFECTED AREA(S) beneath BOTH breasts TWICE DAILY Patient taking differently: Apply 1 Application topically 2 (two) times daily. 03/08/22   Newlin, Enobong, MD  diclofenac  Sodium (VOLTAREN ) 1 % GEL Apply 4 g topically 4 (four) times daily. 05/05/21   Newlin, Enobong, MD  diphenhydrAMINE  (BENADRYL ) 25 MG tablet Take  1 tablet (25 mg total) by mouth every 6 (six) hours as needed. 05/01/23   Dreama, [place]  N, FNP  fenofibrate  (TRICOR ) 145 MG tablet Take 1 tablet (145 mg total) by mouth every evening. 07/21/23   Newlin, Enobong, MD  FISH OIL-CHOLECALCIFEROL PO Take by mouth.    [provider]  FLUoxetine  (PROZAC ) 20 MG capsule Take 1 capsule (20 mg total) by mouth every morning. 07/21/23   Newlin, Enobong, MD  furosemide  (LASIX ) 20 MG tablet Take 1 tablet (20 mg total) by mouth daily. 07/21/23   Newlin, Enobong, MD  gabapentin  (NEURONTIN ) 300 MG capsule Take 2 capsules (600 mg total) by mouth 3 (three) times daily. 11/02/23   Newlin, Enobong, MD  glucose blood (ACCU-CHEK GUIDE TEST) test strip Use to check blood sugar 3 times daily. 08/23/23   Newlin, Enobong, MD  hydrOXYzine  (ATARAX ) 25 MG tablet Take 1 tablet (25 mg total) by mouth 3 (three) times daily as needed. For anxiety 12/15/23   Newlin, Enobong, MD  insulin  glargine (LANTUS  SOLOSTAR) 100 UNIT/ML Solostar Pen Inject 33 Units into the skin 2 (two) times daily. 07/21/23   Newlin, Enobong, MD  Insulin  Pen Needle (COMFORT EZ PEN NEEDLES) 31G X 5 MM MISC Inject 1 each into the skin in the morning and at bedtime. use as directed 07/21/23   Newlin, Enobong, MD  Lancets [facility] DELICA PLUS LANCET33G) MISC Use to check blood sugar 3 times daily. 08/23/23   Newlin, Enobong, MD  Lancets Misc. (ACCU-CHEK FASTCLIX LANCET)  KIT USE AS DIRECTED 11/12/21   Newlin, Enobong, MD  lidocaine  (LIDODERM ) 5 % Place ONE PATCH onto THE SKIN daily. REMOVE & Discard PATCH WITHIN 12 hours OR as directed by MD Patient taking differently: Place 1 patch onto the skin every 12 (twelve) hours. 12/30/22   Newlin, Enobong, MD  lisinopril  (ZESTRIL ) 40 MG tablet Take 1 tablet (40 mg total) by mouth daily. 07/21/23   Newlin, Enobong, MD  metFORMIN  (GLUCOPHAGE ) 500 MG tablet Take 2 tablets (1,000 mg total) by mouth 2 (two) times daily. 07/21/23   Newlin, Enobong, MD  methocarbamol  (ROBAXIN ) 750 MG  tablet TAKE 1 TABLET (750 MG TOTAL) BY MOUTH EVERY 8 (EIGHT) HOURS AS NEEDED FOR MUSCLE SPASMS. 10/12/23   Newlin, Enobong, MD  metoCLOPramide  (REGLAN ) 10 MG tablet Take 1 tablet (10 mg total) by mouth every 8 (eight) hours as needed for nausea and vomiting 07/21/23   Newlin, Enobong, MD  nitroGLYCERIN  (NITROSTAT ) 0.4 MG SL tablet Place 0.4 mg under the tongue every 5 (five) minutes as needed for chest pain.    [provider]  nystatin  powder Apply 1 Application topically 3 (three) times daily. 11/12/23   Johnie Flaming A, NP  oxyCODONE  (OXY IR/ROXICODONE ) 5 MG immediate release tablet Take 5 mg by mouth every 4 (four) hours as needed for moderate pain. 11/29/22   [provider]  pantoprazole  (PROTONIX ) 40 MG tablet Take 1 tablet (40 mg total) by mouth 2 (two) times daily. 07/21/23   Newlin, Enobong, MD  rosuvastatin  (CRESTOR ) 40 MG tablet Take 1 tablet (40 mg total) by mouth every evening. 07/21/23   Newlin, Enobong, MD  sucralfate  (CARAFATE ) 1 g tablet Take 1 g by mouth 4 (four) times daily -  with meals and at bedtime.    [provider]  traZODone  (DESYREL ) 50 MG tablet Take 1 tablet (50 mg total) by mouth at bedtime. 07/21/23   Newlin, Enobong, MD  triamcinolone  cream (KENALOG ) 0.1 % Apply 1 Application topically 2 (two) times daily. 07/21/23   Newlin, Enobong, MD    Allergies: Cymbalta [duloxetine hcl]    Review of Systems  Cardiovascular:  Positive for chest pain.    Updated Vital Signs BP (!) 157/73   Pulse 73   Temp 97.7 F (36.5 C) (Oral)   Resp 19   SpO2 99%   Physical Exam Vitals and nursing note reviewed.  Constitutional:      General: She is not in acute distress.    Appearance: Normal appearance.  HENT:     Head: Normocephalic and atraumatic.  Eyes:     Conjunctiva/sclera: Conjunctivae normal.  Cardiovascular:     Rate and Rhythm: Normal rate.  Pulmonary:     Effort: Pulmonary effort is normal. No respiratory distress.     Comments: Speaking  in full complete sentences without difficulty.  No signs of respiratory distress.  Maintaining oxygen saturation without supplementation. Chest:     Chest wall: Tenderness present.     Comments: TTP over left anterior chest wall. No crepitus nor instability. No rib tenderness Skin:    Coloration: Skin is not jaundiced or pale.  Neurological:     Mental Status: She is alert. Mental status is at baseline.     (all labs ordered are listed, but only abnormal results are displayed) Labs Reviewed  BASIC METABOLIC PANEL WITH GFR - Abnormal; Notable for the following components:      Result Value   Sodium 130 (*)    Chloride 95 (*)  Glucose, Bld 513 (*)    All other components within normal limits  I-STAT VENOUS BLOOD GAS, ED - Abnormal; Notable for the following components:   pCO2, Ven 39.9 (*)    pO2, Ven 98 (*)    Sodium 130 (*)    All other components within normal limits  CBG MONITORING, ED - Abnormal; Notable for the following components:   Glucose-Capillary 462 (*)    All other components within normal limits  CBG MONITORING, ED - Abnormal; Notable for the following components:   Glucose-Capillary 337 (*)    All other components within normal limits  CBC  TROPONIN I (HIGH SENSITIVITY)  TROPONIN I (HIGH SENSITIVITY)    EKG: None  Radiology: DG Chest 2 View Result Date: 12/21/2023 CLINICAL DATA:  Chest pain EXAM: CHEST - 2 VIEW COMPARISON:  12/17/2022 FINDINGS: The heart size and mediastinal contours are within normal limits. Both lungs are clear. The visualized skeletal structures are unremarkable. IMPRESSION: No active cardiopulmonary disease. Electronically Signed   By: Oneil Devonshire M.D.   On: 12/21/2023 20:14     Medications Ordered in the ED  sodium chloride  0.9 % bolus 1,000 mL (1,000 mLs Intravenous New Bag/Given 12/21/23 2207)  insulin  aspart (novoLOG ) injection 10 Units (10 Units Subcutaneous Given 12/21/23 2204)  alum & mag hydroxide-simeth (MAALOX/MYLANTA)  200-200-20 MG/5ML suspension 30 mL (30 mLs Oral Given 12/21/23 2346)    And  lidocaine  (XYLOCAINE ) 2 % viscous mouth solution 15 mL (15 mLs Oral Given 12/21/23 2346)  acetaminophen  (TYLENOL ) tablet 650 mg (650 mg Oral Given 12/21/23 2345)      {Click here for ABCD2, HEART and other calculators REFRESH Note before signing:1}                              Medical Decision Making Amount and/or Complexity of Data Reviewed Labs: ordered. Radiology: ordered.  Risk OTC drugs. Prescription drug management.   Patient presents to the ED for concern of CP, hyperglycemia, this involves an extensive number of treatment options, and is a complaint that carries with it a high risk of complications and morbidity.  The differential diagnosis includes ACS, PE, dissection, pneumonia, COPD exacerbation, hyperglycemia, HHS, DKA, dehydration   Co morbidities that complicate the patient evaluation  See hpi   Additional history obtained:  Additional history obtained from Nursing and Outside Medical Records   External records from outside source obtained and reviewed including triage RN note, cards note from 2024, echo from 2023 with normal LVEF   Lab Tests:  I Ordered, and personally interpreted labs.  The pertinent results include:   Troponin WNL x2 pH 7.412 Initial CBG 513 Na 130   Imaging Studies ordered:  I ordered imaging studies including CXR  I independently visualized and interpreted imaging which showed no acute cardiopulmonary pathology I agree with the radiologist interpretation   Cardiac Monitoring:  The patient was maintained on a cardiac monitor.  I personally viewed and interpreted the cardiac monitored which showed an underlying rhythm of: NSR at 82 bpm with no T wave or ST abnormalities   Medicines ordered and prescription drug management:  I ordered medication including gi cocktail, tylenol   for pain  Reevaluation of the patient after these medicines showed that the  patient improved I have reviewed the patients home medicines and have made adjustments as needed     Problem List / ED Course:  T2DM hyperglycemia No signs of DKA. No met acidosis  nor elevated anion gap Provided IVF and insulin  subcu Reports that she did not take insulin  today as she was sleeping all day but is normally complaint with checking CBG and taking medications Discussed importance of taking insulin  as prescribed  Chest pain Does not seem ACS in nature, reproducible with palpation.  Also appears that there is a mild reflux component to it.  She has a mild TTP in epigastric region.  Will provide Tylenol , GI cocktail for pain management CXR neg Has not had tachycardia nor hypoxia during entirety of ED stay Ambulated patient to bathroom with no complaints of chest pain, shortness of breath.  No hypoxia or tachycardia while walking Low suspicion for dissection with no HTN, altering bp, neuro signs, no radiation of pain into back Low suspicion for PE as etiology of chest pain and she has not had any tachycardia or hypoxia at rest nor with exertion.  Does not appear in respiratory distress.  No unilateral pedal edema, hemoptysis, history of PE, recent travel, recent surgery Follows with [facility] - should have annual visit scheduled  Dyspnea CTAB with no wheezing Does not appear fluid overloaded Speaking full complete sentences without difficulty.  No hypoxia nor need for supplemental oxygen   Reevaluation:  After the interventions noted above, I reevaluated the patient and found that they have :improved   Social Determinants of Health:  Current smoker   Dispostion:  After consideration of the diagnostic results and the patients response to treatment, I feel that the patent would benefit from outpatient management with PCP, cardiology follow-up.   Discussed ED workup, disposition, return to ED precautions with patient who expresses understanding agrees  with plan.  All questions answered to their satisfaction.  They are agreeable to plan.  Discharge instructions provided on paperwork  Final diagnoses:  Nonspecific chest pain  Hyperglycemia due to diabetes mellitus [facility])    ED Discharge Orders     None

## 2023-12-22 ENCOUNTER — Emergency Department (HOSPITAL_COMMUNITY)

## 2023-12-22 DIAGNOSIS — R079 Chest pain, unspecified: Secondary | ICD-10-CM | POA: Diagnosis not present

## 2023-12-22 DIAGNOSIS — R0789 Other chest pain: Secondary | ICD-10-CM | POA: Diagnosis not present

## 2023-12-22 DIAGNOSIS — R7989 Other specified abnormal findings of blood chemistry: Secondary | ICD-10-CM | POA: Diagnosis not present

## 2023-12-22 MED ORDER — IBUPROFEN 400 MG PO TABS: 600.0000 mg | ORAL_TABLET | Freq: Once | ORAL | Status: DC

## 2023-12-22 MED ORDER — IOHEXOL 350 MG/ML SOLN
75.0000 mL | Freq: Once | INTRAVENOUS | Status: AC | PRN
  Administered 2023-12-22: 75 mL via INTRAVENOUS

## 2023-12-22 NOTE — ED Provider Notes (Signed)
 Results for orders placed or performed during the hospital encounter of 12/21/23  Basic metabolic panel   Collection Time: 12/21/23  7:52 PM  Result Value Ref Range   Sodium 130 (L) 135 - 145 mmol/L   Potassium 4.3 3.5 - 5.1 mmol/L   Chloride 95 (L) 98 - 111 mmol/L   CO2 24 22 - 32 mmol/L   Glucose, Bld 513 (HH) 70 - 99 mg/dL   BUN 22 8 - 23 mg/dL   Creatinine, Ser 9.22 0.44 - 1.00 mg/dL   Calcium  9.3 8.9 - 10.3 mg/dL   GFR, Estimated >39 >39 mL/min   Anion gap 11 5 - 15  CBC   Collection Time: 12/21/23  7:52 PM  Result Value Ref Range   WBC 6.5 4.0 - 10.5 K/uL   RBC 4.67 3.87 - 5.11 MIL/uL   Hemoglobin 14.2 12.0 - 15.0 g/dL   HCT 59.2 63.9 - 53.9 %   MCV 87.2 80.0 - 100.0 fL   MCH 30.4 26.0 - 34.0 pg   MCHC 34.9 30.0 - 36.0 g/dL   RDW 87.6 88.4 - 84.4 %   Platelets 252 150 - 400 K/uL   nRBC 0.0 0.0 - 0.2 %  Troponin I (High Sensitivity)   Collection Time: 12/21/23  7:52 PM  Result Value Ref Range   Troponin I (High Sensitivity) 6 <18 ng/L  CBG monitoring, ED   Collection Time: 12/21/23  9:35 PM  Result Value Ref Range   Glucose-Capillary 462 (H) 70 - 99 mg/dL  Troponin I (High Sensitivity)   Collection Time: 12/21/23  9:52 PM  Result Value Ref Range   Troponin I (High Sensitivity) 7 <18 ng/L  I-Stat venous blood gas, ([facility] ED, [facility], [facility])   Collection Time: 12/21/23 10:15 PM  Result Value Ref Range   pH, Ven 7.412 7.25 - 7.43   pCO2, Ven 39.9 (L) 44 - 60 mmHg   pO2, Ven 98 (H) 32 - 45 mmHg   Bicarbonate 25.4 20.0 - 28.0 mmol/L   TCO2 27 22 - 32 mmol/L   O2 Saturation 98 %   Acid-Base Excess 1.0 0.0 - 2.0 mmol/L   Sodium 130 (L) 135 - 145 mmol/L   Potassium 4.4 3.5 - 5.1 mmol/L   Calcium , Ion 1.18 1.15 - 1.40 mmol/L   HCT 39.0 36.0 - 46.0 %   Hemoglobin 13.3 12.0 - 15.0 g/dL   Sample type VENOUS   POC CBG, ED   Collection Time: 12/21/23 11:16 PM  Result Value Ref Range   Glucose-Capillary 337 (H) 70 - 99 mg/dL   CT Angio Chest PE W and/or Wo Contrast Result  Date: 12/22/2023 CLINICAL DATA:  Positive D-dimer and chest pain EXAM: CT ANGIOGRAPHY CHEST WITH CONTRAST TECHNIQUE: Multidetector CT imaging of the chest was performed using the standard protocol during bolus administration of intravenous contrast. Multiplanar CT image reconstructions and MIPs were obtained to evaluate the vascular anatomy. RADIATION DOSE REDUCTION: This exam was performed according to the departmental dose-optimization program which includes automated exposure control, adjustment of the mA and/or kV according to patient size and/or use of iterative reconstruction technique. CONTRAST:  75mL OMNIPAQUE  IOHEXOL  350 MG/ML SOLN COMPARISON:  Chest x-ray from the previous day. FINDINGS: Cardiovascular: Atherosclerotic calcifications of the thoracic aorta are noted. No aneurysmal dilatation is seen. No cardiac enlargement is noted. The pulmonary artery shows a normal branching pattern bilaterally. No filling defects to suggest pulmonary emboli are noted. Coronary calcifications are seen. Mediastinum/Nodes: Thoracic inlet is within normal limits.  No hilar or mediastinal adenopathy is noted. The esophagus as visualized is within normal limits. Lungs/Pleura: Lungs are well aerated bilaterally. No focal infiltrate or sizable effusion is seen. Tiny 2 mm parenchymal nodule is noted in the left lower lobe best seen on image number 94 of series 6. No further follow-up is recommended. No other focal abnormality is seen. Upper Abdomen: Visualized upper abdomen shows changes of prior cholecystectomy. No other focal abnormality is noted. Musculoskeletal: Degenerative changes of the thoracic spine are noted. No acute rib abnormality is seen. Review of the MIP images confirms the above findings. IMPRESSION: No evidence of pulmonary emboli. Left solid pulmonary nodule measuring 2 mm. Per Fleischner Society Guidelines, no routine follow-up imaging is recommended. These guidelines do not apply to immunocompromised patients  and patients with cancer. Follow up in patients with significant comorbidities as clinically warranted. For lung cancer screening, adhere to Lung-RADS guidelines. Reference: Radiology. 2017; 284(1):228-43. Aortic Atherosclerosis (ICD10-I70.0). Electronically Signed   By: Oneil Devonshire M.D.   On: 12/22/2023 00:26   DG Chest 2 View Result Date: 12/21/2023 CLINICAL DATA:  Chest pain EXAM: CHEST - 2 VIEW COMPARISON:  12/17/2022 FINDINGS: The heart size and mediastinal contours are within normal limits. Both lungs are clear. The visualized skeletal structures are unremarkable. IMPRESSION: No active cardiopulmonary disease. Electronically Signed   By: Oneil Devonshire M.D.   On: 12/21/2023 20:14   CTA without fidings of PE.  Does have incidental finding of pulmonary nodule which she was made aware of. Can follow-up with PCP.  Return here for new concerns.   Jarold Olam HERO, PA-C 12/22/23 0119    Lorette Mayo, MD 12/22/23 [phone]

## 2023-12-23 ENCOUNTER — Telehealth: Payer: Self-pay | Admitting: Family Medicine

## 2023-12-23 NOTE — Telephone Encounter (Signed)
 Copied from CRM [id number]. Topic: Medical Record Request - Records Request >> Dec 23, 2023  8:41 AM Delon HERO wrote:  Reason for CRM: Patient is calling to report that [facility] is need to send Chart office visit notes to receive her medications. Please advise

## 2023-12-26 ENCOUNTER — Telehealth: Payer: Self-pay | Admitting: Family Medicine

## 2023-12-26 DIAGNOSIS — E1169 Type 2 diabetes mellitus with other specified complication: Secondary | ICD-10-CM

## 2023-12-26 NOTE — Telephone Encounter (Signed)
 New eye referral is needed.

## 2023-12-26 NOTE — Telephone Encounter (Signed)
 Copied from CRM #[id number]. Topic: Referral - Question >> Dec 26, 2023 12:07 PM Nathanel BROCKS wrote:  Reason for CRM: the woman that she was sent to see for her eye exam is no longer there. She is asking for another eye exam from a different provider since she is not there anymore.   Please advise

## 2023-12-26 NOTE — Addendum Note (Signed)
 Addended by: Colene Mines on: 12/26/2023 12:49 PM   Modules accepted: Orders

## 2023-12-26 NOTE — Telephone Encounter (Signed)
 Referral has been placed.

## 2023-12-27 ENCOUNTER — Other Ambulatory Visit: Payer: Self-pay

## 2023-12-27 NOTE — Patient Instructions (Signed)
 Visit Information  Thank you for taking time to visit with me today. Please don't hesitate to contact me if I can be of assistance to you before our next scheduled appointment.  Your next care management appointment is by telephone on Monday, August 11th at 3:30pm   Please call the care guide team at [phone] if you need to cancel, schedule, or reschedule an appointment.   A reminder to ALL patients/family/friends, please call the [place]  National Suicide Prevention Lifeline: 812-568-4239 or TTY: (873)248-8717 TTY [phone]) to talk to a trained counselor if you are experiencing a Mental Health or Behavioral Health Crisis or need someone to talk to.  Santana Stamp BSN, CCM [facility]  VBC[employer] Population Health RN Care Manager Direct Dial: [phone]  Fax: [phone]

## 2023-12-27 NOTE — Telephone Encounter (Unsigned)
 Copied from CRM [id number]. Topic: Referral - Status >> Dec 27, 2023 10:22 AM Sarah Charles wrote: Reason for CRM: Pt called reporting that she needs another location for an eye exam. She has been sent to locations that cannot see her because they either only do surgeries or the provider has left. She needs a new location   Pt would like a call back from the clinic today

## 2023-12-27 NOTE — Patient Outreach (Signed)
 Complex Care Management   Visit Note  12/27/2023  Name:  Sarah Charles MRN: [id number] DOB: 1961/02/18  Situation: Referral received for Complex Care Management related to DM.  I obtained verbal consent from Patient.  Visit completed with Sarah Charles  on the phone. Main concern today is trying to prevent UTI's, reports frequent urination, denies painful urination/odorous urine.  Had ED visit 12/18/23 for frequent urination, diagnosed with yeast infection, given fluconazole  200mg , two doses.  Today, complains of frequent urination.   Background:   Past Medical History:  Diagnosis Date   Arthritis    Coronary artery calcification seen on CT scan    a. 07/2017 noted on CT abd.   Depression    GERD (gastroesophageal reflux disease)    Hyperlipidemia    Hypertension    Obesity    Sleep apnea    a. Does not use CPAP cause i dont think I need it anymore.   Tobacco abuse    Type II diabetes mellitus (HCC)     Assessment: Patient Reported Symptoms:  Cognitive Cognitive Status: Alert and oriented to person, place, and time, Normal speech and language skills      Neurological Neurological Review of Symptoms: No symptoms reported    HEENT HEENT Symptoms Reported: Not assessed      Cardiovascular Cardiovascular Symptoms Reported: No symptoms reported    Respiratory Respiratory Symptoms Reported: No symptoms reported    Endocrine Endocrine Symptoms Reported: No symptoms reported Is patient diabetic?: Yes Is patient checking blood sugars at home?: Yes List most recent blood sugar readings, include date and time of day: patient has not taken her FBG today, states she just got out of bed.    Gastrointestinal Gastrointestinal Symptoms Reported: No symptoms reported      Genitourinary Genitourinary Symptoms Reported: Frequency (Denies symptoms today, taking antibiotic as prescribed at ED visit) Additional Genitourinary Details: Patient went to ED on 7/27 for UTI, ED gave one dose of  fluconazole  for yeast, continues to have urinary frequency but no other symptoms. Discussed drinking more water, taking insulin  as prescribed to keep blood sugars within normal limits due to high blood sugars can cause urinary symptoms.    Integumentary Other Integumentary Symptoms: Continues to treat rash under breast with nystatin  poweder.  States she doesn't wear a bra, discussed keeping skin clean and dry, then apply the nystatin  powder, and consider wearing a cotton tank-like bra to keep breast from sitting on skin, or cut up a soft cotton t-shirt and place under her breasts, change if it becomes moist.    Musculoskeletal Musculoskelatal Symptoms Reviewed: No symptoms reported   Falls in the past year?: No    Psychosocial Psychosocial Symptoms Reported: No symptoms reported            10/20/2023    1:23 PM  Depression screen PHQ 2/9  Decreased Interest 0  Down, Depressed, Hopeless 0  PHQ - 2 Score 0    There were no vitals filed for this visit.  Medications Reviewed Today   Medications were not reviewed in this encounter     Recommendation:   -Educated patient on interventions that may help with preventing UTI's such as drinking water during the day to hydrate, keep blood sugars under control by taking medications as ordered, avoid sugary drinks and high carb/sugar foods.    Follow Up Plan:   Telephone follow-up in 1 week  Santana Stamp BSN, CCM [place]  VBCI Population Health RN Care Manager Direct Dial: [phone]  Fax: [phone]

## 2023-12-29 ENCOUNTER — Telehealth: Payer: Self-pay

## 2023-12-29 NOTE — Telephone Encounter (Signed)
Can we change referral?

## 2023-12-29 NOTE — Congregational Nurse Program (Signed)
 RN received referral from [facility] and Wellness Case Manager Slater B. To connect with client for community resources related to housing and grief support. RN spoke to Ms. Livie and referred her to [facility] Community Case Managers that help with finding affordable housing, RN will attempt to get her on there schedule (they are usually a walk in only clinic). RN also offered support for her losses and opportunity to schedule and appointment with RN on 12/29/23, to talk about her situation more. Client is interested but not sure where IRC is and needed to talk to a friend before she could schedule time. RN gave client phone number if she would like to schedule time to talk. No further concerns at this time.

## 2023-12-29 NOTE — Telephone Encounter (Signed)
 Noted.    Copied from CRM [id number]. Topic: General - Call Back - No Documentation >> Dec 29, 2023  8:57 AM Gustabo D wrote: Case worker called yesterday she is returning her call

## 2023-12-30 ENCOUNTER — Ambulatory Visit: Payer: Self-pay

## 2023-12-30 NOTE — Telephone Encounter (Signed)
 2nd attempt

## 2023-12-30 NOTE — Telephone Encounter (Signed)
 Patient called, left VM to return the call to the office to speak to NT.  Unable to reach patient after 3 attempts by E2C2 NT, routing to the provider for resolution per protocol.       Pt stated she pees frequently every 10 minutes [phone]

## 2023-12-30 NOTE — Progress Notes (Signed)
 Complex Care Management Note Care Guide Note  12/30/2023 Name: Sarah Charles MRN: [id number] DOB: Mar 01, 1961   Complex Care Management Outreach Attempts: A second unsuccessful outreach was attempted today to offer the patient with information about available complex care management services.  Follow Up Plan:  Additional outreach attempts will be made to offer the patient complex care management information and services.   Encounter Outcome:  No Answer  Dreama [facility]  [facility], [facility] Care Management Assistant Direct Dial: [phone]  Fax: [phone]

## 2023-12-30 NOTE — Telephone Encounter (Signed)
 Message from Avram MATSU sent at 12/30/2023  3:40 PM EDT  Pt stated she pees frequently every 10 minutes [phone]

## 2024-01-02 ENCOUNTER — Telehealth: Payer: Self-pay

## 2024-01-02 ENCOUNTER — Other Ambulatory Visit: Payer: Self-pay

## 2024-01-02 NOTE — Telephone Encounter (Signed)
 Patient has been called and given contact information for eye referral.     Copied from CRM #[id number]. Topic: Referral - Request for Referral >> Jan 02, 2024 11:27 AM Winona SAUNDERS wrote:  Pt needs a new Ophthalmology referral as the Doctor she was recently referred to is no longer in that office.

## 2024-01-02 NOTE — Telephone Encounter (Signed)
 Noted

## 2024-01-03 NOTE — Telephone Encounter (Signed)
 Patient has been informed.

## 2024-01-04 ENCOUNTER — Other Ambulatory Visit: Payer: Self-pay

## 2024-01-04 NOTE — Patient Outreach (Signed)
 Complex Care Management   Visit Note  01/04/2024  Name:  Sarah Charles MRN: [id number] DOB: [birth year]  Situation: Referral received for Complex Care Management related to DM, HTN. I obtained verbal consent from Patient.  Visit completed with Sarah Charles  on the phone.  Denies problems with picking up medications to take as prescribed BUT states she is tired of taking all the medications, I have too many.    Background:   Past Medical History:  Diagnosis Date   Arthritis    Coronary artery calcification seen on CT scan    a. 07/2017 noted on CT abd.   Depression    GERD (gastroesophageal reflux disease)    Hyperlipidemia    Hypertension    Obesity    Sleep apnea    a. Does not use CPAP cause i dont think I need it anymore.   Tobacco abuse    Type II diabetes mellitus (HCC)     Assessment: Patient Reported Symptoms:  Cognitive Cognitive Status: Alert and oriented to person, place, and time, Normal speech and language skills      Neurological Neurological Review of Symptoms: No symptoms reported    HEENT HEENT Symptoms Reported: No symptoms reported      Cardiovascular Cardiovascular Symptoms Reported: No symptoms reported    Respiratory Respiratory Symptoms Reported: No symptoms reported    Endocrine Endocrine Symptoms Reported: Increased thirst, Increased urination List most recent blood sugar readings, include date and time of day: Patient reports FBG today of 240mg /dL Endocrine Comment: J8R taken at 12/15/23 PCP visit: A1C 13.0%, an increase from 09/15/23 of 8.5%. States she is taking Lantus  33units twice a day, metformin  500mg  2 tabs twice a day.  Gastrointestinal Gastrointestinal Symptoms Reported: No symptoms reported      Genitourinary Genitourinary Symptoms Reported: Pain/burning with urination Additional Genitourinary Details: Continues to have urine frequency, burning at labia site.  She is using Desitin cream for skin barrier, purchased Monistat  product and  inserted a dose. Genitourinary Self-Management Outcome: 2 (bad)  Integumentary Integumentary Symptoms Reported: Not assessed    Musculoskeletal Musculoskelatal Symptoms Reviewed: Not assessed        Psychosocial Psychosocial Symptoms Reported: Not assessed            10/20/2023    1:23 PM  Depression screen PHQ 2/9  Decreased Interest 0  Down, Depressed, Hopeless 0  PHQ - 2 Score 0    There were no vitals filed for this visit.  Medications Reviewed Today   Medications were not reviewed in this encounter     Recommendation:   PCP follow up scheduled 01/13/24 -Encouraged patient to take DM medications as ordered. Educated on importance of keeping blood sugars within recommended limits, effect on eyes, kidneys, and other organs, high blood sugar side effects could be causing frequent UTI'sl.     Follow Up Plan:   Telephone follow-up two weeks.   Santana Stamp BSN, CCM [place]  [facility] Population Health RN Care Manager Direct Dial: [phone]  Fax: [phone]

## 2024-01-04 NOTE — Progress Notes (Signed)
 Complex Care Management Note Care Guide Note  01/04/2024 Name: Sarah Charles MRN: [id number] DOB: 02/17/1961   Complex Care Management Outreach Attempts: A third unsuccessful outreach was attempted today to offer the patient with information about available complex care management services.  Follow Up Plan:  No further outreach attempts will be made at this time. We have been unable to contact the patient to offer or enroll patient in complex care management services.  Encounter Outcome:  No Answer  Dreama [facility]  [facility], [facility] Health Care Management Assistant Direct Dial: [phone]  Fax: [phone]

## 2024-01-04 NOTE — Patient Instructions (Signed)
 Visit Information  Thank you for taking time to visit with me today. Please don't hesitate to contact me if I can be of assistance to you before our next scheduled appointment.  Your next care management appointment is by telephone on Wednesday, August 27th at 1:00pm.    Please call the care guide team at [phone] if you need to cancel, schedule, or reschedule an appointment.   A reminder to ALL patients/family/friends, please call the [place]  National Suicide Prevention Lifeline: (412)443-8010 or TTY: 915-820-2326 TTY (514)478-9047) to talk to a trained counselor if you are experiencing a Mental Health or Behavioral Health Crisis or need someone to talk to.  Santana Stamp BSN, CCM [place]  [facility]BC[facility] Population Health RN Care Manager Direct Dial: [phone]  Fax: [phone]

## 2024-01-05 ENCOUNTER — Telehealth: Payer: Self-pay

## 2024-01-05 NOTE — Telephone Encounter (Signed)
 LVM with contact information.    Copied from CRM [id number]. Topic: General - Other >> Jan 05, 2024 10:21 AM Sarah Charles wrote: Reason for CRM: Patient called saying she was given a phone # for an eye doctor that was recommended yesterday but cannot remember the name and misplaced the #. Would like to receive it once more

## 2024-01-08 ENCOUNTER — Encounter (HOSPITAL_COMMUNITY): Payer: Self-pay

## 2024-01-08 ENCOUNTER — Emergency Department (HOSPITAL_COMMUNITY)
Admission: EM | Admit: 2024-01-08 | Discharge: 2024-01-08 | Disposition: A | Discharge status: Home / Self Care | Attending: Emergency Medicine | Admitting: Emergency Medicine

## 2024-01-08 ENCOUNTER — Other Ambulatory Visit: Payer: Self-pay

## 2024-01-08 ENCOUNTER — Ambulatory Visit (HOSPITAL_COMMUNITY)
Admission: EM | Admit: 2024-01-08 | Discharge: 2024-01-08 | Disposition: A | Discharge status: Home / Self Care | Attending: Physician Assistant | Admitting: Physician Assistant

## 2024-01-08 DIAGNOSIS — R21 Rash and other nonspecific skin eruption: Secondary | ICD-10-CM | POA: Diagnosis not present

## 2024-01-08 DIAGNOSIS — B379 Candidiasis, unspecified: Secondary | ICD-10-CM

## 2024-01-08 DIAGNOSIS — Z794 Long term (current) use of insulin: Secondary | ICD-10-CM | POA: Diagnosis not present

## 2024-01-08 DIAGNOSIS — R739 Hyperglycemia, unspecified: Secondary | ICD-10-CM

## 2024-01-08 DIAGNOSIS — E1165 Type 2 diabetes mellitus with hyperglycemia: Secondary | ICD-10-CM | POA: Diagnosis not present

## 2024-01-08 DIAGNOSIS — Z79899 Other long term (current) drug therapy: Secondary | ICD-10-CM | POA: Diagnosis not present

## 2024-01-08 DIAGNOSIS — E1065 Type 1 diabetes mellitus with hyperglycemia: Secondary | ICD-10-CM | POA: Diagnosis not present

## 2024-01-08 DIAGNOSIS — F32A Depression, unspecified: Secondary | ICD-10-CM

## 2024-01-08 LAB — POCT URINALYSIS DIP (MANUAL ENTRY)
Bilirubin, UA: NEGATIVE
Blood, UA: NEGATIVE
Glucose, UA: 500 mg/dL — AB
Ketones, POC UA: NEGATIVE mg/dL
Leukocytes, UA: NEGATIVE
Nitrite, UA: NEGATIVE
Protein Ur, POC: NEGATIVE mg/dL
Spec Grav, UA: <=1.005 — AB (ref 1.010–1.025)
Urobilinogen, UA: 0.2 U/dL
pH, UA: 5.5 (ref 5.0–8.0)

## 2024-01-08 LAB — URINALYSIS, ROUTINE W REFLEX MICROSCOPIC
Bacteria, UA: NONE SEEN
Bilirubin Urine: NEGATIVE
Glucose, UA: >=500 mg/dL — AB
Hgb urine dipstick: NEGATIVE
Ketones, ur: NEGATIVE mg/dL
Leukocytes,Ua: NEGATIVE
Nitrite: POSITIVE — AB
Protein, ur: NEGATIVE mg/dL
Specific Gravity, Urine: 1.029 (ref 1.005–1.030)
pH: 5 (ref 5.0–8.0)

## 2024-01-08 LAB — CBC
HCT: 41.4 % (ref 36.0–46.0)
Hemoglobin: 14.2 g/dL (ref 12.0–15.0)
MCH: 30.5 pg (ref 26.0–34.0)
MCHC: 34.3 g/dL (ref 30.0–36.0)
MCV: 89 fL (ref 80.0–100.0)
Platelets: 254 K/uL (ref 150–400)
RBC: 4.65 MIL/uL (ref 3.87–5.11)
RDW: 12.6 % (ref 11.5–15.5)
WBC: 8.2 K/uL (ref 4.0–10.5)
nRBC: 0 % (ref 0.0–0.2)

## 2024-01-08 LAB — I-STAT CHEM 8, ED
BUN: 22 mg/dL (ref 8–23)
Calcium, Ion: 1.19 mmol/L (ref 1.15–1.40)
Chloride: 97 mmol/L — ABNORMAL LOW (ref 98–111)
Creatinine, Ser: 0.7 mg/dL (ref 0.44–1.00)
Glucose, Bld: 493 mg/dL — ABNORMAL HIGH (ref 70–99)
HCT: 42 % (ref 36.0–46.0)
Hemoglobin: 14.3 g/dL (ref 12.0–15.0)
Potassium: 4.6 mmol/L (ref 3.5–5.1)
Sodium: 130 mmol/L — ABNORMAL LOW (ref 135–145)
TCO2: 23 mmol/L (ref 22–32)

## 2024-01-08 LAB — CBG MONITORING, ED
Glucose-Capillary: 490 mg/dL — ABNORMAL HIGH (ref 70–99)
Glucose-Capillary: 456 mg/dL — ABNORMAL HIGH (ref 70–99)
Glucose-Capillary: 374 mg/dL — ABNORMAL HIGH (ref 70–99)

## 2024-01-08 LAB — POCT FASTING CBG KUC MANUAL ENTRY: POCT Glucose (KUC): 538 mg/dL — AB (ref 70–99)

## 2024-01-08 MED ORDER — INSULIN ASPART 100 UNIT/ML IJ SOLN
5.0000 [IU] | Freq: Once | INTRAMUSCULAR | Status: AC
  Administered 2024-01-08: 5 [IU] via SUBCUTANEOUS

## 2024-01-08 MED ORDER — INSULIN ASPART 100 UNIT/ML IJ SOLN
8.0000 [IU] | Freq: Once | INTRAMUSCULAR | Status: AC
  Administered 2024-01-08: 8 [IU] via SUBCUTANEOUS

## 2024-01-08 MED ORDER — NYSTATIN 100000 UNIT/GM EX CREA: TOPICAL_CREAM | CUTANEOUS | 0 refills | Status: DC

## 2024-01-08 MED ORDER — FLUCONAZOLE 150 MG PO TABS: 150.0000 mg | ORAL_TABLET | Freq: Every day | ORAL | 0 refills | Status: AC

## 2024-01-08 MED ORDER — SODIUM CHLORIDE 0.9 % IV BOLUS
1000.0000 mL | Freq: Once | INTRAVENOUS | Status: AC
  Administered 2024-01-08: 1000 mL via INTRAVENOUS

## 2024-01-08 NOTE — Discharge Instructions (Signed)
 Go to the Emergency department

## 2024-01-08 NOTE — ED Triage Notes (Signed)
 Patient here today with c/o vaginal pain and burning in urination X 2 days. Patient has tried Monistat  and Desitin with no relief.

## 2024-01-08 NOTE — ED Provider Notes (Signed)
  Physical Exam  BP 135/83   Pulse 86   Temp (!) 97.3 F (36.3 C)   Resp 18   Ht 5' 3 (1.6 m)   Wt 81.6 kg   SpO2 97%   BMI 31.89 kg/m   Physical Exam  Procedures  Procedures  ED Course / MDM   Clinical Course as of 01/08/24 1703  Sun Jan 08, 2024  1510 Assumed care from Dr Laurice. 63 yo F with hx of IDDM didn't take insulin  over the past few days. Elevated blood sugar. Not in DKA. Getting fluids and diflucan . Can be dc'd if blood sugar improves.  [RP]  1657 Says that she typically takes 30 units in the morning and night of her insulin .  It appears to be Lantus  based on her chart review.  Repeat CBG is 374.  Will give her 5 more units of insulin  and have her take her 30 units tonight.  Instructed to check her blood sugars and follow-up with her primary doctor to see if her insulin  needs to be adjusted. [RP]    Clinical Course User Index [RP] Yolande Lamar BROCKS, MD   Medical Decision Making Amount and/or Complexity of Data Reviewed Labs: ordered.  Risk Prescription drug management.       Yolande Lamar BROCKS, MD 01/08/24 [phone]

## 2024-01-08 NOTE — ED Provider Notes (Signed)
 [facility]    CSN: [id number] Arrival date & time: 01/08/24  1100      History   Chief Complaint Chief Complaint  Patient presents with   Dysuria    HPI Sarah Charles is a 63 y.o. female.   Patient complains of burning with urination and burning in the vaginal area.  Patient complains of a rash to her abdomen.  Patient denies any fever or chills.  She has not had any nausea or vomiting.  Patient has a past medical history of diabetes.  Patient is not currently taking her insulin .  Patient has been depressed she states she lost her son and lost her daughter.  Patient states she has lost everything and she just does not care anymore.  Patient reports she is not currently in any therapy or any counseling.  The history is provided by the patient. No language interpreter was used.  Dysuria   Past Medical History:  Diagnosis Date   Arthritis    Coronary artery calcification seen on CT scan    a. 07/2017 noted on CT abd.   Depression    GERD (gastroesophageal reflux disease)    Hyperlipidemia    Hypertension    Obesity    Sleep apnea    a. Does not use CPAP cause i dont think I need it anymore.   Tobacco abuse    Type II diabetes mellitus [facility])     Patient Active Problem List   Diagnosis Date Noted   Abnormal findings on diagnostic imaging of digestive system 02/06/2023   Infectious colitis 02/06/2023   Acute blood loss anemia 02/05/2023   Pancolitis (HCC) 02/04/2023   Rectal bleeding 02/04/2023   Acute diarrhea 02/04/2023   Imaging of gastrointestinal tract abnormal 02/04/2023   Colitis with rectal bleeding 02/03/2023   DKA (diabetic ketoacidosis) (HCC) 12/17/2022   Cellulitis 12/17/2022   Nausea and vomiting 12/17/2022   Hypertensive urgency 12/17/2022   Hyperlipidemia LDL goal <70 07/19/2022   Sleep apnea, unspecified 11/26/2020   CAD (coronary artery disease) 11/22/2017   Chest pain, precordial 11/04/2017   Acute chest pain    Type 2 diabetes  mellitus with hyperlipidemia (HCC) 10/29/2017   Diabetic polyneuropathy associated with type 2 diabetes mellitus (HCC) 05/30/2017   Mixed dyslipidemia 05/30/2017   Depression 05/31/2016   Uncontrolled type 2 diabetes mellitus with complication 04/08/2016   Uncontrolled type 2 diabetes mellitus with complication, with long-term current use of insulin  04/20/2015   Cellulitis of breast 12/29/2013   Diabetes mellitus (HCC) 12/29/2013   Cellulitis of female breast 12/29/2013   Symptomatic menopausal or female climacteric states 04/08/2008   OTHER SPECIFIED DISEASE OF NAIL 04/08/2008   URINARY URGENCY 04/08/2008   COUGH 04/02/2008   FATIGUE 04/12/2007   Hypertension 04/12/2007   OBESITY, NOS 07/21/2006   Major depressive disorder, recurrent episode (HCC) 07/21/2006   TOBACCO DEPENDENCE 07/21/2006   GASTROESOPHAGEAL REFLUX, NO ESOPHAGITIS 07/21/2006   INCONTINENCE, STRESS, FEMALE 07/21/2006    Past Surgical History:  Procedure Laterality Date   BIOPSY  12/21/2022   Procedure: BIOPSY;  Surgeon: Saintclair Jasper, MD;  Location: [facility];  Service: Gastroenterology;;   CHOLECYSTECTOMY     CORONARY PRESSURE/FFR STUDY N/A 11/04/2017   Procedure: INTRAVASCULAR PRESSURE WIRE/FFR STUDY;  Surgeon: Swaziland, Peter M, MD;  Location: [facility] INVASIVE CV LAB;  Service: Cardiovascular;  Laterality: N/A;   CORONARY STENT INTERVENTION N/A 11/04/2017   Procedure: CORONARY STENT INTERVENTION;  Surgeon: Swaziland, Peter M, MD;  Location: [facility] INVASIVE CV LAB;  Service: Cardiovascular;  Laterality: N/A;   ESOPHAGOGASTRODUODENOSCOPY (EGD) WITH PROPOFOL  N/A 12/21/2022   Procedure: ESOPHAGOGASTRODUODENOSCOPY (EGD) WITH PROPOFOL ;  Surgeon: Saintclair Jasper, MD;  Location: [facility];  Service: Gastroenterology;  Laterality: N/A;   INCISION AND DRAINAGE ABSCESS Left 12/22/2012   Procedure: INCISION AND DRAINAGE ABSCESS;  Surgeon: Oneil DELENA Budge, MD;  Location: [facility];  Service: General;  Laterality: Left;   LEFT HEART CATH AND  CORONARY ANGIOGRAPHY N/A 11/04/2017   Procedure: LEFT HEART CATH AND CORONARY ANGIOGRAPHY;  Surgeon: Swaziland, Peter M, MD;  Location: [facility] INVASIVE CV LAB;  Service: Cardiovascular;  Laterality: N/A;   TONSILLECTOMY     TUBAL LIGATION      OB History   No obstetric history on file.      Home Medications    Prior to Admission medications   Medication Sig Start Date End Date Taking? Authorizing Provider  aspirin  EC 81 MG tablet Take 1 tablet (81 mg total) by mouth daily. Swallow whole. 08/25/23   Newlin, Enobong, MD  Blood Glucose Monitoring Suppl (ACCU-CHEK GUIDE) w/Device KIT Use to check blood sugar 3 times daily. E11.65 10/03/23   Newlin, Enobong, MD  buPROPion  (WELLBUTRIN  SR) 150 MG 12 hr tablet Take 1 tablet (150 mg total) by mouth 2 (two) times daily. 07/21/23   Newlin, Enobong, MD  carvedilol  (COREG ) 3.125 MG tablet Take 1 tablet (3.125 mg total) by mouth 2 (two) times daily with a meal. 07/21/23   Delbert Clam, MD  cetirizine  (ZYRTEC ) 10 MG tablet TAKE 1 TABLET BY MOUTH EVERY DAY Patient taking differently: Take 10 mg by mouth daily. 08/02/19   Newlin, Enobong, MD  clotrimazole  (LOTRIMIN ) 1 % cream APPLY TO THE AFFECTED AREA(S) beneath BOTH breasts TWICE DAILY Patient taking differently: Apply 1 Application topically 2 (two) times daily. 03/08/22   Newlin, Enobong, MD  diclofenac  Sodium (VOLTAREN ) 1 % GEL Apply 4 g topically 4 (four) times daily. 05/05/21   Newlin, Enobong, MD  diphenhydrAMINE  (BENADRYL ) 25 MG tablet Take 1 tablet (25 mg total) by mouth every 6 (six) hours as needed. 05/01/23   Dreama, [place]  N, FNP  fenofibrate  (TRICOR ) 145 MG tablet Take 1 tablet (145 mg total) by mouth every evening. 07/21/23   Newlin, Enobong, MD  FISH OIL-CHOLECALCIFEROL PO Take by mouth.    [provider]  FLUoxetine  (PROZAC ) 20 MG capsule Take 1 capsule (20 mg total) by mouth every morning. 07/21/23   Newlin, Enobong, MD  furosemide  (LASIX ) 20 MG tablet Take 1 tablet (20 mg total) by  mouth daily. 07/21/23   Newlin, Enobong, MD  gabapentin  (NEURONTIN ) 300 MG capsule Take 2 capsules (600 mg total) by mouth 3 (three) times daily. 11/02/23   Newlin, Enobong, MD  glucose blood (ACCU-CHEK GUIDE TEST) test strip Use to check blood sugar 3 times daily. 08/23/23   Newlin, Enobong, MD  hydrOXYzine  (ATARAX ) 25 MG tablet Take 1 tablet (25 mg total) by mouth 3 (three) times daily as needed. For anxiety 12/15/23   Newlin, Enobong, MD  insulin  glargine (LANTUS  SOLOSTAR) 100 UNIT/ML Solostar Pen Inject 33 Units into the skin 2 (two) times daily. 07/21/23   Newlin, Enobong, MD  Insulin  Pen Needle (COMFORT EZ PEN NEEDLES) 31G X 5 MM MISC Inject 1 each into the skin in the morning and at bedtime. use as directed 07/21/23   Newlin, Enobong, MD  Lancets [facility] DELICA PLUS LANCET33G) MISC Use to check blood sugar 3 times daily. 08/23/23   Newlin, Enobong, MD  Lancets Misc. (ACCU-CHEK FASTCLIX  LANCET) KIT USE AS DIRECTED 11/12/21   Newlin, Enobong, MD  lidocaine  (LIDODERM ) 5 % Place ONE PATCH onto THE SKIN daily. REMOVE & Discard PATCH WITHIN 12 hours OR as directed by MD Patient taking differently: Place 1 patch onto the skin every 12 (twelve) hours. 12/30/22   Newlin, Enobong, MD  lisinopril  (ZESTRIL ) 40 MG tablet Take 1 tablet (40 mg total) by mouth daily. 07/21/23   Newlin, Enobong, MD  metFORMIN  (GLUCOPHAGE ) 500 MG tablet Take 2 tablets (1,000 mg total) by mouth 2 (two) times daily. 07/21/23   Newlin, Enobong, MD  methocarbamol  (ROBAXIN ) 750 MG tablet TAKE 1 TABLET (750 MG TOTAL) BY MOUTH EVERY 8 (EIGHT) HOURS AS NEEDED FOR MUSCLE SPASMS. 10/12/23   Newlin, Enobong, MD  metoCLOPramide  (REGLAN ) 10 MG tablet Take 1 tablet (10 mg total) by mouth every 8 (eight) hours as needed for nausea and vomiting 07/21/23   Newlin, Enobong, MD  nitroGLYCERIN  (NITROSTAT ) 0.4 MG SL tablet Place 0.4 mg under the tongue every 5 (five) minutes as needed for chest pain.    [provider]  nystatin  powder Apply 1  Application topically 3 (three) times daily. 11/12/23   Johnie Flaming A, NP  oxyCODONE  (OXY IR/ROXICODONE ) 5 MG immediate release tablet Take 5 mg by mouth every 4 (four) hours as needed for moderate pain. 11/29/22   [provider]  pantoprazole  (PROTONIX ) 40 MG tablet Take 1 tablet (40 mg total) by mouth 2 (two) times daily. 07/21/23   Newlin, Enobong, MD  rosuvastatin  (CRESTOR ) 40 MG tablet Take 1 tablet (40 mg total) by mouth every evening. 07/21/23   Newlin, Enobong, MD  sucralfate  (CARAFATE ) 1 g tablet Take 1 g by mouth 4 (four) times daily -  with meals and at bedtime.    [provider]  traZODone  (DESYREL ) 50 MG tablet Take 1 tablet (50 mg total) by mouth at bedtime. 07/21/23   Newlin, Enobong, MD  triamcinolone  cream (KENALOG ) 0.1 % Apply 1 Application topically 2 (two) times daily. 07/21/23   Delbert Clam, MD    Family History Family History  Problem Relation Age of Onset   Hypertension Mother    CVA Mother    COPD Mother    Heart disease Mother    Kidney Stones Mother    Heart attack Mother        MI in late 55's   Breast cancer Mother    CAD Father    Hypercholesterolemia Father    Heart attack Father        Died @ 40 of MI   Diabetes Sister    Cancer Maternal Uncle    Diabetes Maternal Grandmother    Cancer Maternal Grandfather        lung cancer   Colon cancer Neg Hx    Esophageal cancer Neg Hx    Stomach cancer Neg Hx    Pancreatic cancer Neg Hx     Social History Social History   Tobacco Use   Smoking status: Every Day    Current packs/day: 0.00    Average packs/day: 1 pack/day for 41.0 years (41.0 ttl pk-yrs)    Types: Cigarettes    Start date: 10/06/1977    Last attempt to quit: 10/07/2018    Years since quitting: 5.2   Smokeless tobacco: Never  Vaping Use   Vaping status: Never Used  Substance Use Topics   Alcohol use: No   Drug use: No     Allergies   Cymbalta [duloxetine hcl]  Review of Systems Review of Systems   Genitourinary:  Positive for dysuria.  All other systems reviewed and are negative.    Physical Exam Triage Vital Signs ED Triage Vitals [01/08/24 1144]  Encounter Vitals Group     BP 133/83     Girls Systolic BP Percentile      Girls Diastolic BP Percentile      Boys Systolic BP Percentile      Boys Diastolic BP Percentile      Pulse Rate 80     Resp 16     Temp 97.8 F (36.6 C)     Temp Source Oral     SpO2 95 %     Weight      Height      Head Circumference      Peak Flow      Pain Score 9     Pain Loc      Pain Education      Exclude from Growth Chart    No data found.  Updated Vital Signs BP 133/83 (BP Location: Left Arm)   Pulse 80   Temp 97.8 F (36.6 C) (Oral)   Resp 16   SpO2 95%   Visual Acuity Right Eye Distance:   Left Eye Distance:   Bilateral Distance:    Right Eye Near:   Left Eye Near:    Bilateral Near:     Physical Exam Vitals and nursing note reviewed.  Constitutional:      Appearance: She is well-developed.  HENT:     Head: Normocephalic.  Cardiovascular:     Rate and Rhythm: Normal rate.  Pulmonary:     Effort: Pulmonary effort is normal.  Abdominal:     General: There is no distension.  Genitourinary:    Comments: Vaginal area perineal area lower abdominal area, diffuse erythema, small pimples, exudative Musculoskeletal:        General: Normal range of motion.  Skin:    General: Skin is warm.  Neurological:     General: No focal deficit present.     Mental Status: She is alert and oriented to person, place, and time.      UC Treatments / Results  Labs (all labs ordered are listed, but only abnormal results are displayed) Labs Reviewed  POCT URINALYSIS DIP (MANUAL ENTRY) - Abnormal; Notable for the following components:      Result Value   Glucose, UA =500 (*)    Spec Grav, UA <=1.005 (*)    All other components within normal limits  POCT FASTING CBG [facility] MANUAL ENTRY - Abnormal; Notable for the following  components:   POCT Glucose ([facility]) 538 (*)    All other components within normal limits    EKG   Radiology No results found.  Procedures Procedures (including critical care time)  Medications Ordered in UC Medications - No data to display  Initial Impression / Assessment and Plan / UC Course  I have reviewed the triage vital signs and the nursing notes.  Pertinent labs & imaging results that were available during my care of the patient were reviewed by me and considered in my medical decision making (see chart for details).     Patient's glucose is 538.  She states she has not taken her insulin  today she is unsure when she last took her insulin  she notes she did not take it yesterday.  Patient states she is not taking her insulin  just because she is miserable and just does not  care anymore. I discussed the situation with the patient.  Patient will go to the emergency department for further evaluation.  Patient needs glucose control as well as evaluation for her depression.  Patient would most likely benefit from prescription Diflucan .. Final Clinical Impressions(s) / UC Diagnoses   Final diagnoses:  Type 2 diabetes mellitus with hyperglycemia, unspecified whether long term insulin  use (HCC)  Candidiasis   Discharge Instructions   None    ED Prescriptions   None    PDMP not reviewed this encounter. An After Visit Summary was printed and given to the patient.       Flint Sonny POUR, PA-C 01/08/24 1411

## 2024-01-08 NOTE — ED Provider Notes (Signed)
  EMERGENCY DEPARTMENT AT [facility] Provider Note   CSN: [id number] Arrival date & time: 01/08/24  1344     Patient presents with: Hyperglycemia   Sarah Charles is a 63 y.o. female.   63 year old female with prior medical history as detailed below presents for evaluation of hyperglycemia and perineal irritation and rash.  Patient has not taken her insulin  for the last 2 to 3 days per her report.  She denies fever.  She reports feeling down secondary to multiple family members who have died recently.  She denies suicidal ideation.  The history is provided by the patient and medical records.       Prior to Admission medications   Medication Sig Start Date End Date Taking? Authorizing Provider  fluconazole  (DIFLUCAN ) 150 MG tablet Take 1 tablet (150 mg total) by mouth daily for 3 days. 01/08/24 01/11/24 Yes Laurice Maude BROCKS, MD  nystatin  cream (MYCOSTATIN ) Apply to affected area 2 times daily 01/08/24  Yes Laurice Maude BROCKS, MD  aspirin  EC 81 MG tablet Take 1 tablet (81 mg total) by mouth daily. Swallow whole. 08/25/23   Newlin, Enobong, MD  Blood Glucose Monitoring Suppl (ACCU-CHEK GUIDE) w/Device KIT Use to check blood sugar 3 times daily. E11.65 10/03/23   Newlin, Enobong, MD  buPROPion  (WELLBUTRIN  SR) 150 MG 12 hr tablet Take 1 tablet (150 mg total) by mouth 2 (two) times daily. 07/21/23   Newlin, Enobong, MD  carvedilol  (COREG ) 3.125 MG tablet Take 1 tablet (3.125 mg total) by mouth 2 (two) times daily with a meal. 07/21/23   Newlin, Corrina, MD  cetirizine  (ZYRTEC ) 10 MG tablet TAKE 1 TABLET BY MOUTH EVERY DAY Patient taking differently: Take 10 mg by mouth daily. 08/02/19   Newlin, Enobong, MD  clotrimazole  (LOTRIMIN ) 1 % cream APPLY TO THE AFFECTED AREA(S) beneath BOTH breasts TWICE DAILY Patient taking differently: Apply 1 Application topically 2 (two) times daily. 03/08/22   Newlin, Enobong, MD  diclofenac  Sodium (VOLTAREN ) 1 % GEL Apply 4 g topically 4  (four) times daily. 05/05/21   Newlin, Enobong, MD  diphenhydrAMINE  (BENADRYL ) 25 MG tablet Take 1 tablet (25 mg total) by mouth every 6 (six) hours as needed. 05/01/23   Dreama, [place]  N, FNP  fenofibrate  (TRICOR ) 145 MG tablet Take 1 tablet (145 mg total) by mouth every evening. 07/21/23   Newlin, Enobong, MD  FISH OIL-CHOLECALCIFEROL PO Take by mouth.    [provider]  FLUoxetine  (PROZAC ) 20 MG capsule Take 1 capsule (20 mg total) by mouth every morning. 07/21/23   Newlin, Enobong, MD  furosemide  (LASIX ) 20 MG tablet Take 1 tablet (20 mg total) by mouth daily. 07/21/23   Newlin, Enobong, MD  gabapentin  (NEURONTIN ) 300 MG capsule Take 2 capsules (600 mg total) by mouth 3 (three) times daily. 11/02/23   Newlin, Enobong, MD  glucose blood (ACCU-CHEK GUIDE TEST) test strip Use to check blood sugar 3 times daily. 08/23/23   Newlin, Enobong, MD  hydrOXYzine  (ATARAX ) 25 MG tablet Take 1 tablet (25 mg total) by mouth 3 (three) times daily as needed. For anxiety 12/15/23   Newlin, Enobong, MD  insulin  glargine (LANTUS  SOLOSTAR) 100 UNIT/ML Solostar Pen Inject 33 Units into the skin 2 (two) times daily. 07/21/23   Newlin, Enobong, MD  Insulin  Pen Needle (COMFORT EZ PEN NEEDLES) 31G X 5 MM MISC Inject 1 each into the skin in the morning and at bedtime. use as directed 07/21/23   Newlin, Enobong, MD  Lancets [facility]  DELICA PLUS LANCET33G) MISC Use to check blood sugar 3 times daily. 08/23/23   Newlin, Enobong, MD  Lancets Misc. (ACCU-CHEK FASTCLIX LANCET) KIT USE AS DIRECTED 11/12/21   Newlin, Enobong, MD  lidocaine  (LIDODERM ) 5 % Place ONE PATCH onto THE SKIN daily. REMOVE & Discard PATCH WITHIN 12 hours OR as directed by MD Patient taking differently: Place 1 patch onto the skin every 12 (twelve) hours. 12/30/22   Newlin, Enobong, MD  lisinopril  (ZESTRIL ) 40 MG tablet Take 1 tablet (40 mg total) by mouth daily. 07/21/23   Newlin, Enobong, MD  metFORMIN  (GLUCOPHAGE ) 500 MG tablet Take 2 tablets (1,000 mg  total) by mouth 2 (two) times daily. 07/21/23   Newlin, Enobong, MD  methocarbamol  (ROBAXIN ) 750 MG tablet TAKE 1 TABLET (750 MG TOTAL) BY MOUTH EVERY 8 (EIGHT) HOURS AS NEEDED FOR MUSCLE SPASMS. 10/12/23   Newlin, Enobong, MD  metoCLOPramide  (REGLAN ) 10 MG tablet Take 1 tablet (10 mg total) by mouth every 8 (eight) hours as needed for nausea and vomiting 07/21/23   Newlin, Enobong, MD  nitroGLYCERIN  (NITROSTAT ) 0.4 MG SL tablet Place 0.4 mg under the tongue every 5 (five) minutes as needed for chest pain.    [provider]  nystatin  powder Apply 1 Application topically 3 (three) times daily. 11/12/23   Johnie Flaming A, NP  oxyCODONE  (OXY IR/ROXICODONE ) 5 MG immediate release tablet Take 5 mg by mouth every 4 (four) hours as needed for moderate pain. 11/29/22   [provider]  pantoprazole  (PROTONIX ) 40 MG tablet Take 1 tablet (40 mg total) by mouth 2 (two) times daily. 07/21/23   Newlin, Enobong, MD  rosuvastatin  (CRESTOR ) 40 MG tablet Take 1 tablet (40 mg total) by mouth every evening. 07/21/23   Newlin, Enobong, MD  sucralfate  (CARAFATE ) 1 g tablet Take 1 g by mouth 4 (four) times daily -  with meals and at bedtime.    [provider]  traZODone  (DESYREL ) 50 MG tablet Take 1 tablet (50 mg total) by mouth at bedtime. 07/21/23   Newlin, Enobong, MD  triamcinolone  cream (KENALOG ) 0.1 % Apply 1 Application topically 2 (two) times daily. 07/21/23   Newlin, Enobong, MD    Allergies: Cymbalta [duloxetine hcl]    Review of Systems  All other systems reviewed and are negative.   Updated Vital Signs BP 135/83   Pulse 86   Temp (!) 97.3 F (36.3 C)   Resp 18   Ht 5' 3 (1.6 m)   Wt 81.6 kg   SpO2 97%   BMI 31.89 kg/m   Physical Exam Vitals and nursing note reviewed.  Constitutional:      General: She is not in acute distress.    Appearance: Normal appearance. She is well-developed.  HENT:     Head: Normocephalic and atraumatic.  Eyes:     Conjunctiva/sclera:  Conjunctivae normal.     Pupils: Pupils are equal, round, and reactive to light.  Cardiovascular:     Rate and Rhythm: Normal rate and regular rhythm.     Heart sounds: Normal heart sounds.  Pulmonary:     Effort: Pulmonary effort is normal. No respiratory distress.     Breath sounds: Normal breath sounds.  Abdominal:     General: There is no distension.     Palpations: Abdomen is soft.     Tenderness: There is no abdominal tenderness.  Musculoskeletal:        General: No deformity. Normal range of motion.     Cervical back:  Normal range of motion and neck supple.  Skin:    General: Skin is warm and dry.     Comments: Intertriginous areas of the lower abdomen and groin are erythematous.  Neurological:     General: No focal deficit present.     Mental Status: She is alert and oriented to person, place, and time.     (all labs ordered are listed, but only abnormal results are displayed) Labs Reviewed  URINALYSIS, ROUTINE W REFLEX MICROSCOPIC - Abnormal; Notable for the following components:      Result Value   Color, Urine STRAW (*)    Glucose, UA >=500 (*)    Nitrite POSITIVE (*)    All other components within normal limits  CBG MONITORING, ED - Abnormal; Notable for the following components:   Glucose-Capillary 490 (*)    All other components within normal limits  I-STAT CHEM 8, ED - Abnormal; Notable for the following components:   Sodium 130 (*)    Chloride 97 (*)    Glucose, Bld 493 (*)    All other components within normal limits  CBC    EKG: None  Radiology: No results found.   Procedures   Medications Ordered in the ED  sodium chloride  0.9 % bolus 1,000 mL (has no administration in time range)  insulin  aspart (novoLOG ) injection 8 Units (has no administration in time range)    Clinical Course as of 01/08/24 1556  Sun Jan 08, 2024  1510 Assumed care from Dr Laurice. 63 yo F with hx of IDDM didn't take insulin  over the past few days. Elevated blood  sugar. Not in DKA. Getting fluids and diflucan . Can be dc'd if blood sugar improves.  [RP]    Clinical Course User Index [RP] Yolande Lamar BROCKS, MD                                 Medical Decision Making Patient is presenting with hyperglycemia likely secondary to noncompliance with insulin .  Patient also complains of erythema and irritation of her intertriginous and groin areas.  Exam is consistent with likely yeast/topical fungal infection.  Patient reports symptoms of depression.  She denies SI.  She denies HI.  She declined psychiatric evaluation.  She feels comfortable with following up with her regular outpatient care providers regarding this issue.  She is aware of resources including [facility] for counseling and psychiatric care.  IV fluids and insulin  administered.  Patient would likely be appropriate for discharge after blood glucose is improved.  Oncoming ED provider is aware of case.  Amount and/or Complexity of Data Reviewed Labs: ordered.  Risk Prescription drug management.        Final diagnoses:  Hyperglycemia  Rash    ED Discharge Orders          Ordered    fluconazole  (DIFLUCAN ) 150 MG tablet  Daily        01/08/24 1431    nystatin  cream (MYCOSTATIN )        01/08/24 1437               Laurice Maude BROCKS, MD 01/08/24 1557

## 2024-01-08 NOTE — ED Triage Notes (Signed)
 Pt went to UC this am for groin pain and burning w/urination. UC sent pt to ED d/t hyperglycemia, BS = 560. Pt denies nausea, vomiting, dizziness or weakness.

## 2024-01-08 NOTE — Discharge Instructions (Addendum)
 Return for any problems.   Take insulin  as previously prescribed.  Take your nightly insulin  at 9 PM tonight  Follow-up with your primary doctor in several days send please keep track of your blood sugar to discuss with them to see if your insulin  regimen needs to be adjusted

## 2024-01-08 NOTE — ED Notes (Signed)
 Patient is being discharged from the Urgent Care and sent to the Emergency Department via private vehicle . Per provider, patient is in need of higher level of care due to hyperglycemia and vaginal discomfort. Patient is aware and verbalizes understanding of plan of care.  Vitals:   01/08/24 1144  BP: 133/83  Pulse: 80  Resp: 16  Temp: 97.8 F (36.6 C)  SpO2: 95%

## 2024-01-10 ENCOUNTER — Telehealth: Payer: Self-pay | Admitting: Family Medicine

## 2024-01-10 NOTE — Telephone Encounter (Signed)
 I spoke to the patient and explained that I received 2 messages, one stating she wanted to speak to me and the other stating she wanted to speak to a Production designer, theatre/television/film. I asked her how I can help her and she said she needs help finding a place to live.  I asked her if she remembers speaking to Aloysius Ford, RN/Congregational Nurse and she said she did and she would like [facility] to call her tomorrow. I told her that I would send that request to [facility] and I also told her that the [facility] is where she needs to go for assistance with housing resources. I told her to call me back if she has further questions and she was very appreciative

## 2024-01-10 NOTE — Telephone Encounter (Signed)
 Copied from CRM #[id number]. Topic: General - Other >> Jan 10, 2024 10:29 AM Cleave MATSU wrote: Reason for CRM: pt wants to speak with a Production designer, theatre/television/film. Please follow up

## 2024-01-11 ENCOUNTER — Telehealth: Payer: Self-pay | Admitting: Family Medicine

## 2024-01-11 ENCOUNTER — Other Ambulatory Visit (HOSPITAL_COMMUNITY): Payer: Self-pay

## 2024-01-11 NOTE — Telephone Encounter (Signed)
 pt confirmed appt per volunteer

## 2024-01-12 ENCOUNTER — Other Ambulatory Visit (HOSPITAL_COMMUNITY): Payer: Self-pay

## 2024-01-13 ENCOUNTER — Encounter: Payer: Self-pay | Admitting: Nurse Practitioner

## 2024-01-13 ENCOUNTER — Other Ambulatory Visit (HOSPITAL_COMMUNITY): Payer: Self-pay

## 2024-01-13 ENCOUNTER — Telehealth: Payer: Self-pay

## 2024-01-13 ENCOUNTER — Ambulatory Visit: Attending: Nurse Practitioner | Admitting: Nurse Practitioner

## 2024-01-13 VITALS — BP 146/78 | HR 78 | Resp 18 | Ht 63.0 in | Wt 177.4 lb

## 2024-01-13 DIAGNOSIS — I2510 Atherosclerotic heart disease of native coronary artery without angina pectoris: Secondary | ICD-10-CM

## 2024-01-13 DIAGNOSIS — M5431 Sciatica, right side: Secondary | ICD-10-CM

## 2024-01-13 DIAGNOSIS — R110 Nausea: Secondary | ICD-10-CM | POA: Diagnosis not present

## 2024-01-13 DIAGNOSIS — Z7984 Long term (current) use of oral hypoglycemic drugs: Secondary | ICD-10-CM

## 2024-01-13 DIAGNOSIS — M5441 Lumbago with sciatica, right side: Secondary | ICD-10-CM | POA: Diagnosis not present

## 2024-01-13 DIAGNOSIS — L304 Erythema intertrigo: Secondary | ICD-10-CM | POA: Diagnosis not present

## 2024-01-13 DIAGNOSIS — E119 Type 2 diabetes mellitus without complications: Secondary | ICD-10-CM

## 2024-01-13 DIAGNOSIS — Z1231 Encounter for screening mammogram for malignant neoplasm of breast: Secondary | ICD-10-CM

## 2024-01-13 DIAGNOSIS — F32A Depression, unspecified: Secondary | ICD-10-CM | POA: Diagnosis not present

## 2024-01-13 DIAGNOSIS — M5442 Lumbago with sciatica, left side: Secondary | ICD-10-CM

## 2024-01-13 DIAGNOSIS — E1169 Type 2 diabetes mellitus with other specified complication: Secondary | ICD-10-CM | POA: Diagnosis not present

## 2024-01-13 DIAGNOSIS — I10 Essential (primary) hypertension: Secondary | ICD-10-CM

## 2024-01-13 DIAGNOSIS — E118 Type 2 diabetes mellitus with unspecified complications: Secondary | ICD-10-CM

## 2024-01-13 DIAGNOSIS — I152 Hypertension secondary to endocrine disorders: Secondary | ICD-10-CM | POA: Diagnosis not present

## 2024-01-13 DIAGNOSIS — E1159 Type 2 diabetes mellitus with other circulatory complications: Secondary | ICD-10-CM

## 2024-01-13 DIAGNOSIS — R600 Localized edema: Secondary | ICD-10-CM | POA: Diagnosis not present

## 2024-01-13 DIAGNOSIS — I739 Peripheral vascular disease, unspecified: Secondary | ICD-10-CM

## 2024-01-13 DIAGNOSIS — Z794 Long term (current) use of insulin: Secondary | ICD-10-CM | POA: Diagnosis not present

## 2024-01-13 DIAGNOSIS — E1165 Type 2 diabetes mellitus with hyperglycemia: Secondary | ICD-10-CM

## 2024-01-13 DIAGNOSIS — G8929 Other chronic pain: Secondary | ICD-10-CM

## 2024-01-13 DIAGNOSIS — E1142 Type 2 diabetes mellitus with diabetic polyneuropathy: Secondary | ICD-10-CM

## 2024-01-13 MED ORDER — METFORMIN HCL 500 MG PO TABS
1000.0000 mg | ORAL_TABLET | Freq: Two times a day (BID) | ORAL | 1 refills | Status: AC
  Filled 2024-01-13 (×2): qty 360, 90d supply, fill #0
  Filled 2024-04-25: qty 360, 90d supply, fill #1

## 2024-01-13 MED ORDER — ROSUVASTATIN CALCIUM 40 MG PO TABS
40.0000 mg | ORAL_TABLET | Freq: Every evening | ORAL | 1 refills | Status: AC
  Filled 2024-01-13 (×2): qty 90, 90d supply, fill #0
  Filled 2024-06-05: qty 90, 90d supply, fill #1

## 2024-01-13 MED ORDER — BUPROPION HCL ER (SR) 150 MG PO TB12
150.0000 mg | ORAL_TABLET | Freq: Two times a day (BID) | ORAL | 1 refills | Status: AC
  Filled 2024-01-13 (×2): qty 180, 90d supply, fill #0
  Filled 2024-06-05: qty 180, 90d supply, fill #1

## 2024-01-13 MED ORDER — GABAPENTIN 300 MG PO CAPS
600.0000 mg | ORAL_CAPSULE | Freq: Three times a day (TID) | ORAL | 3 refills | Status: AC
  Filled 2024-01-13 – 2024-01-18 (×3): qty 180, 30d supply, fill #0
  Filled 2024-03-14: qty 180, 30d supply, fill #1
  Filled 2024-05-29 – 2024-06-12 (×2): qty 180, 30d supply, fill #2

## 2024-01-13 MED ORDER — TRAZODONE HCL 50 MG PO TABS
50.0000 mg | ORAL_TABLET | Freq: Every day | ORAL | 1 refills | Status: AC
  Filled 2024-01-13 (×2): qty 90, 90d supply, fill #0
  Filled 2024-04-03: qty 90, 90d supply, fill #1

## 2024-01-13 MED ORDER — NYSTATIN 100000 UNIT/GM EX POWD
1.0000 | Freq: Three times a day (TID) | CUTANEOUS | 1 refills | Status: DC
  Filled 2024-01-13 (×2): qty 60, 20d supply, fill #0

## 2024-01-13 MED ORDER — CARVEDILOL 3.125 MG PO TABS
3.1250 mg | ORAL_TABLET | Freq: Two times a day (BID) | ORAL | 1 refills | Status: AC
  Filled 2024-01-13 (×2): qty 180, 90d supply, fill #0
  Filled 2024-06-05: qty 180, 90d supply, fill #1

## 2024-01-13 MED ORDER — LANTUS SOLOSTAR 100 UNIT/ML ~~LOC~~ SOPN
33.0000 [IU] | PEN_INJECTOR | Freq: Two times a day (BID) | SUBCUTANEOUS | 6 refills | Status: DC
  Filled 2024-01-13 (×2): qty 45, 68d supply, fill #0
  Filled 2024-04-11: qty 45, 68d supply, fill #1

## 2024-01-13 MED ORDER — LISINOPRIL 40 MG PO TABS
40.0000 mg | ORAL_TABLET | Freq: Every day | ORAL | 1 refills | Status: AC
  Filled 2024-01-13 (×2): qty 90, 90d supply, fill #0
  Filled 2024-06-05: qty 90, 90d supply, fill #1

## 2024-01-13 MED ORDER — METOCLOPRAMIDE HCL 10 MG PO TABS
10.0000 mg | ORAL_TABLET | Freq: Three times a day (TID) | ORAL | 0 refills | Status: AC | PRN
  Filled 2024-01-13 (×3): qty 40, 14d supply, fill #0

## 2024-01-13 MED ORDER — FENOFIBRATE 145 MG PO TABS
145.0000 mg | ORAL_TABLET | Freq: Every evening | ORAL | 1 refills | Status: AC
  Filled 2024-01-13 (×2): qty 90, 90d supply, fill #0
  Filled 2024-06-05: qty 90, 90d supply, fill #1

## 2024-01-13 MED ORDER — COMFORT EZ PEN NEEDLES 31G X 5 MM MISC
1.0000 | Freq: Two times a day (BID) | 3 refills | Status: AC
  Filled 2024-01-13 (×2): qty 100, 50d supply, fill #0
  Filled 2024-02-06 – 2024-02-21 (×3): qty 100, 50d supply, fill #1

## 2024-01-13 MED ORDER — FUROSEMIDE 20 MG PO TABS
20.0000 mg | ORAL_TABLET | Freq: Every day | ORAL | 1 refills | Status: AC
  Filled 2024-01-13 (×2): qty 90, 90d supply, fill #0
  Filled 2024-06-05: qty 90, 90d supply, fill #1

## 2024-01-13 MED ORDER — FLUCONAZOLE 150 MG PO TABS
150.0000 mg | ORAL_TABLET | ORAL | 0 refills | Status: AC
  Filled 2024-01-13 (×2): qty 4, 28d supply, fill #0

## 2024-01-13 MED ORDER — ONETOUCH DELICA PLUS LANCET33G MISC
6 refills | Status: DC
  Filled 2024-01-13: qty 100, 34d supply, fill #0
  Filled 2024-01-13: qty 100, 33d supply, fill #0

## 2024-01-13 MED ORDER — PANTOPRAZOLE SODIUM 40 MG PO TBEC
40.0000 mg | DELAYED_RELEASE_TABLET | Freq: Two times a day (BID) | ORAL | 1 refills | Status: AC
  Filled 2024-01-13 (×2): qty 180, 90d supply, fill #0
  Filled 2024-06-05: qty 180, 90d supply, fill #1

## 2024-01-13 MED ORDER — TRIAMCINOLONE ACETONIDE 0.1 % EX CREA
1.0000 | TOPICAL_CREAM | Freq: Two times a day (BID) | CUTANEOUS | 1 refills | Status: DC
  Filled 2024-01-13: qty 45, 23d supply, fill #0

## 2024-01-13 MED ORDER — FLUOXETINE HCL 20 MG PO CAPS
20.0000 mg | ORAL_CAPSULE | Freq: Every morning | ORAL | 1 refills | Status: AC
  Filled 2024-01-13 (×2): qty 90, 90d supply, fill #0
  Filled 2024-06-05: qty 90, 90d supply, fill #1

## 2024-01-13 MED ORDER — METHOCARBAMOL 750 MG PO TABS
750.0000 mg | ORAL_TABLET | Freq: Three times a day (TID) | ORAL | 2 refills | Status: AC | PRN
  Filled 2024-01-13 (×2): qty 90, 30d supply, fill #0

## 2024-01-13 MED ORDER — NYSTATIN 100000 UNIT/GM EX CREA
TOPICAL_CREAM | CUTANEOUS | 3 refills | Status: AC
Start: 2024-01-13 — End: ?
  Filled 2024-01-13: qty 30, 15d supply, fill #0

## 2024-01-13 NOTE — Patient Instructions (Addendum)
 LOW BACK PAIN Placed in Sarah Charles Ph# [phone] [address] [place]  [address] [place], [place] [address]   [facility] Located in: [facility] Address: [address], [place], [place] [address] Phone: [phone]

## 2024-01-13 NOTE — Telephone Encounter (Signed)
 Copied from CRM #[id number]. Topic: General - Other >> Jan 11, 2024 10:20 AM Berneda FALCON wrote: Reason for CRM: Patient would like us  to fax her last eye exam appt to [facility] at [phone]  Patient callback is [phone] if needed.

## 2024-01-13 NOTE — Progress Notes (Signed)
 Assessment & Plan:  Sarah Charles was seen today for hypertension.  Diagnoses and all orders for this visit:  Hypertension associated with diabetes (HCC) -     lisinopril  (ZESTRIL ) 40 MG tablet; Take 1 tablet (40 mg total) by mouth daily. Blood pressure slightly elevated. She is on multiple medications, including a diuretic.   Hyperlipidemia associated with type 2 diabetes mellitus (HCC) -     fenofibrate  (TRICOR ) 145 MG tablet; Take 1 tablet (145 mg total) by mouth every evening. -     rosuvastatin  (CRESTOR ) 40 MG tablet; Take 1 tablet (40 mg total) by mouth every evening.  Diabetes mellitus treated with insulin  and oral medication (HCC) -     metFORMIN  (GLUCOPHAGE ) 500 MG tablet; Take 2 tablets (1,000 mg total) by mouth 2 (two) times daily. -     insulin  glargine (LANTUS  SOLOSTAR) 100 UNIT/ML Solostar Pen; Inject 33 Units into the skin 2 (two) times daily. -     Insulin  Pen Needle (COMFORT EZ PEN NEEDLES) 31G X 5 MM MISC; Inject 1 each into the skin in the morning and at bedtime. use as directed Blood sugars sometimes elevated despite adherence to medication regimen. She expresses fatigue with taking multiple medications daily.   Depression, unspecified depression type -     buPROPion  (WELLBUTRIN  SR) 150 MG 12 hr tablet; Take 1 tablet (150 mg total) by mouth 2 (two) times daily. -     traZODone  (DESYREL ) 50 MG tablet; Take 1 tablet (50 mg total) by mouth at bedtime. -     FLUoxetine  (PROZAC ) 20 MG capsule; Take 1 capsule (20 mg total) by mouth every morning.  Diabetic polyneuropathy associated with type 2 diabetes mellitus (HCC) -     gabapentin  (NEURONTIN ) 300 MG capsule; Take 2 capsules (600 mg total) by mouth 3 (three) times daily.  Coronary artery disease involving native coronary artery of native heart without angina pectoris -     carvedilol  (COREG ) 3.125 MG tablet; Take 1 tablet (3.125 mg total) by mouth 2 (two) times daily with a meal. -     rosuvastatin  (CRESTOR ) 40 MG tablet;  Take 1 tablet (40 mg total) by mouth every evening.  Chronic midline low back pain with bilateral sciatica -     methocarbamol  (ROBAXIN ) 750 MG tablet; Take 1 tablet (750 mg total) by mouth every 8 (eight) hours as needed for muscle spasms. 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 Previous CT indicates need for specialist evaluation. - Refer to orthopedic specialist for evaluation and management. - Provide contact information for orthopedic specialist and instruct to call if not contacted by Wednesday. - Continue methocarbamol  for muscle relaxation. - Discuss potential need for steroid injection with spine specialist.  Intertrigo -     nystatin  powder; Apply 1 Application topically 3 (three) times daily. -     nystatin  cream (MYCOSTATIN ); Apply to affected area 2 times daily -     fluconazole  (DIFLUCAN ) 150 MG tablet; Take 1 tablet (150 mg total) by mouth once a week for 4 doses. FOR 4 WEEKS Recurrent intertriginous candidiasis Severe yeast rash in intertriginous areas causing significant itching. Current treatment includes nystatin  cream and powder. - Prescribe fluconazole  for systemic treatment of yeast infection. - Continue use of nystatin  cream and powder.  Pedal edema -     furosemide  (LASIX ) 20 MG tablet; Take 1 tablet (20 mg total) by mouth daily.  Nausea -  metoCLOPramide  (REGLAN ) 10 MG tablet; Take 1 tablet (10 mg total) by mouth every 8 (eight) hours as needed for nausea and vomiting -     pantoprazole  (PROTONIX ) 40 MG tablet; Take 1 tablet (40 mg total) by mouth 2 (two) times daily.   Breast cancer screening by mammogram -     MM 3D SCREENING MAMMOGRAM BILATERAL BREAST; Future   General Health Maintenance Requires routine health maintenance screenings. - Order mammogram and provide referral.  Patient has been counseled on age-appropriate routine health concerns for screening and prevention.  These are reviewed and up-to-date. Referrals have been placed accordingly. Immunizations are up-to-date or declined.    Subjective:   Chief Complaint  Patient presents with   Hypertension   History of Present Illness Sarah Charles is a 63 year old female with diabetes and hypertension who presents for medication management and f/u of HTN  She is a patient of Dr. Newlin  She has a past medical history of Arthritis, Coronary artery calcification seen on CT scan, Depression, GERD , Hyperlipidemia, Hypertension, Obesity, Sleep apnea, Tobacco abuse, and Type II diabetes mellitus .    HTN Blood pressure is elevated. She is currently prescribed carvedilol  3.125 mg BID and lisinopril  40 mg daily BP Readings from Last 3 Encounters:  01/13/24 (!) 146/78  01/08/24 (!) 177/148  01/08/24 133/83    She experiences fluctuations in blood sugar levels despite adherence to her medication regimen. She is frustrated with the number of medications she takes. Wants to know if she needs all of these medications. She has no issues with food resources and is able to obtain necessary food items.  She has significant lower back pain and reports having OA; the pain is severe enough to nearly cause falls. She uses a cane or walker for mobility. She has not consulted a specialist for her back pain and has not had imaging since a CT of her lumbar spine last year. She takes methocarbamol  for muscle relaxation.  She has symptoms of intertrigo candidiasis, using cream and nystatin  powder for treatment. The rash is itchy, and she has been consistent with her treatment regimen. She does not feel the symptoms are improving.      Review of Systems  Constitutional:  Negative for fever, malaise/fatigue and weight loss.  HENT: Negative.  Negative for nosebleeds.   Eyes: Negative.  Negative for blurred vision, double vision and photophobia.  Respiratory: Negative.  Negative for cough and shortness of breath.    Cardiovascular: Negative.  Negative for chest pain, palpitations and leg swelling.  Gastrointestinal:  Positive for heartburn and nausea. Negative for vomiting.  Musculoskeletal: Negative.  Negative for myalgias.  Neurological:  Positive for tingling, sensory change and weakness. Negative for dizziness, focal weakness, seizures and headaches.  Psychiatric/Behavioral:  Positive for depression. Negative for suicidal ideas. The patient has insomnia.     Past Medical History:  Diagnosis Date   Arthritis    Coronary artery calcification seen on CT scan    a. 07/2017 noted on CT abd.   Depression    GERD (gastroesophageal reflux disease)    Hyperlipidemia    Hypertension    Obesity    Sleep apnea    a. Does not use CPAP cause i dont think I need it anymore.   Tobacco abuse    Type II diabetes mellitus [facility])     Past Surgical History:  Procedure Laterality Date   BIOPSY  12/21/2022   Procedure: BIOPSY;  Surgeon: Saintclair Jasper, MD;  Location:  [facility];  Service: Gastroenterology;;   CHOLECYSTECTOMY     CORONARY PRESSURE/FFR STUDY N/A 11/04/2017   Procedure: INTRAVASCULAR PRESSURE WIRE/FFR STUDY;  Surgeon: Swaziland, Peter M, MD;  Location: [facility] INVASIVE CV LAB;  Service: Cardiovascular;  Laterality: N/A;   CORONARY STENT INTERVENTION N/A 11/04/2017   Procedure: CORONARY STENT INTERVENTION;  Surgeon: Swaziland, Peter M, MD;  Location: [facility] INVASIVE CV LAB;  Service: Cardiovascular;  Laterality: N/A;   ESOPHAGOGASTRODUODENOSCOPY (EGD) WITH PROPOFOL  N/A 12/21/2022   Procedure: ESOPHAGOGASTRODUODENOSCOPY (EGD) WITH PROPOFOL ;  Surgeon: Saintclair Jasper, MD;  Location: [facility];  Service: Gastroenterology;  Laterality: N/A;   INCISION AND DRAINAGE ABSCESS Left 12/22/2012   Procedure: INCISION AND DRAINAGE ABSCESS;  Surgeon: Oneil DELENA Budge, MD;  Location: [facility];  Service: General;  Laterality: Left;   LEFT HEART CATH AND CORONARY ANGIOGRAPHY N/A 11/04/2017   Procedure: LEFT HEART CATH AND CORONARY  ANGIOGRAPHY;  Surgeon: Swaziland, Peter M, MD;  Location: [facility] INVASIVE CV LAB;  Service: Cardiovascular;  Laterality: N/A;   TONSILLECTOMY     TUBAL LIGATION      Family History  Problem Relation Age of Onset   Hypertension Mother    CVA Mother    COPD Mother    Heart disease Mother    Kidney Stones Mother    Heart attack Mother        MI in late 53's   Breast cancer Mother    CAD Father    Hypercholesterolemia Father    Heart attack Father        Died @ 67 of MI   Diabetes Sister    Cancer Maternal Uncle    Diabetes Maternal Grandmother    Cancer Maternal Grandfather        lung cancer   Colon cancer Neg Hx    Esophageal cancer Neg Hx    Stomach cancer Neg Hx    Pancreatic cancer Neg Hx     Social History Reviewed with no changes to be made today.   Outpatient Medications Prior to Visit  Medication Sig Dispense Refill   aspirin  EC 81 MG tablet Take 1 tablet (81 mg total) by mouth daily. Swallow whole. 90 tablet 1   FISH OIL-CHOLECALCIFEROL PO Take by mouth.     hydrOXYzine  (ATARAX ) 25 MG tablet Take 1 tablet (25 mg total) by mouth 3 (three) times daily as needed. For anxiety 60 tablet 1   Lancets Misc. (ACCU-CHEK FASTCLIX LANCET) KIT USE AS DIRECTED 1 kit 11   nitroGLYCERIN  (NITROSTAT ) 0.4 MG SL tablet Place 0.4 mg under the tongue every 5 (five) minutes as needed for chest pain.     oxyCODONE  (OXY IR/ROXICODONE ) 5 MG immediate release tablet Take 5 mg by mouth every 4 (four) hours as needed for moderate pain.     sucralfate  (CARAFATE ) 1 g tablet Take 1 g by mouth 4 (four) times daily -  with meals and at bedtime.     Blood Glucose Monitoring Suppl (ACCU-CHEK GUIDE) w/Device KIT Use to check blood sugar 3 times daily. E11.65 1 kit 0   buPROPion  (WELLBUTRIN  SR) 150 MG 12 hr tablet Take 1 tablet (150 mg total) by mouth 2 (two) times daily. 180 tablet 1   carvedilol  (COREG ) 3.125 MG tablet Take 1 tablet (3.125 mg total) by mouth 2 (two) times daily with a meal. 180 tablet 1    cetirizine  (ZYRTEC ) 10 MG tablet TAKE 1 TABLET BY MOUTH EVERY DAY (Patient taking differently: Take 10 mg by mouth daily.) 30 tablet 2  clotrimazole  (LOTRIMIN ) 1 % cream APPLY TO THE AFFECTED AREA(S) beneath BOTH breasts TWICE DAILY (Patient taking differently: Apply 1 Application topically 2 (two) times daily.) 60 g 1   diclofenac  Sodium (VOLTAREN ) 1 % GEL Apply 4 g topically 4 (four) times daily. 100 g 1   diphenhydrAMINE  (BENADRYL ) 25 MG tablet Take 1 tablet (25 mg total) by mouth every 6 (six) hours as needed. 30 tablet 0   fenofibrate  (TRICOR ) 145 MG tablet Take 1 tablet (145 mg total) by mouth every evening. 90 tablet 1   FLUoxetine  (PROZAC ) 20 MG capsule Take 1 capsule (20 mg total) by mouth every morning. 90 capsule 1   furosemide  (LASIX ) 20 MG tablet Take 1 tablet (20 mg total) by mouth daily. 90 tablet 1   gabapentin  (NEURONTIN ) 300 MG capsule Take 2 capsules (600 mg total) by mouth 3 (three) times daily. 180 capsule 3   glucose blood (ACCU-CHEK GUIDE TEST) test strip Use to check blood sugar 3 times daily. 100 each 6   insulin  glargine (LANTUS  SOLOSTAR) 100 UNIT/ML Solostar Pen Inject 33 Units into the skin 2 (two) times daily. 45 mL 6   Insulin  Pen Needle (COMFORT EZ PEN NEEDLES) 31G X 5 MM MISC Inject 1 each into the skin in the morning and at bedtime. use as directed 100 each 3   Lancets (ONETOUCH DELICA PLUS LANCET33G) MISC Use to check blood sugar 3 times daily. 100 each 6   lidocaine  (LIDODERM ) 5 % Place ONE PATCH onto THE SKIN daily. REMOVE & Discard PATCH WITHIN 12 hours OR as directed by MD (Patient taking differently: Place 1 patch onto the skin every 12 (twelve) hours.) 30 patch 0   lisinopril  (ZESTRIL ) 40 MG tablet Take 1 tablet (40 mg total) by mouth daily. 90 tablet 1   metFORMIN  (GLUCOPHAGE ) 500 MG tablet Take 2 tablets (1,000 mg total) by mouth 2 (two) times daily. 360 tablet 1   methocarbamol  (ROBAXIN ) 750 MG tablet TAKE 1 TABLET (750 MG TOTAL) BY MOUTH EVERY 8 (EIGHT)  HOURS AS NEEDED FOR MUSCLE SPASMS. 90 tablet 2   metoCLOPramide  (REGLAN ) 10 MG tablet Take 1 tablet (10 mg total) by mouth every 8 (eight) hours as needed for nausea and vomiting 40 tablet 0   nystatin  cream (MYCOSTATIN ) Apply to affected area 2 times daily 30 g 0   nystatin  powder Apply 1 Application topically 3 (three) times daily. 15 g 0   pantoprazole  (PROTONIX ) 40 MG tablet Take 1 tablet (40 mg total) by mouth 2 (two) times daily. 180 tablet 1   rosuvastatin  (CRESTOR ) 40 MG tablet Take 1 tablet (40 mg total) by mouth every evening. 90 tablet 1   traZODone  (DESYREL ) 50 MG tablet Take 1 tablet (50 mg total) by mouth at bedtime. 90 tablet 1   triamcinolone  cream (KENALOG ) 0.1 % Apply 1 Application topically 2 (two) times daily. 45 g 1   No facility-administered medications prior to visit.    Allergies  Allergen Reactions   Cymbalta [Duloxetine Hcl] Nausea Only and Other (See Comments)    Nausea, lack of therapeutic effect       Objective:    BP (!) 146/78   Pulse 78   Resp 18   Ht 5' 3 (1.6 m)   Wt 177 lb 6.4 oz (80.5 kg)   SpO2 96%   BMI 31.42 kg/m  Wt Readings from Last 3 Encounters:  01/13/24 177 lb 6.4 oz (80.5 kg)  01/08/24 180 lb (81.6 kg)  12/18/23 183 lb (  83 kg)    Physical Exam Vitals and nursing note reviewed.  Constitutional:      Appearance: She is well-developed.  HENT:     Head: Normocephalic and atraumatic.  Cardiovascular:     Rate and Rhythm: Normal rate and regular rhythm.     Heart sounds: Normal heart sounds. No murmur heard.    No friction rub. No gallop.  Pulmonary:     Effort: Pulmonary effort is normal. No tachypnea or respiratory distress.     Breath sounds: Normal breath sounds. No decreased breath sounds, wheezing, rhonchi or rales.  Chest:     Chest wall: No tenderness.  Musculoskeletal:        General: Normal range of motion.     Cervical back: Normal range of motion.  Skin:    General: Skin is warm and dry.  Neurological:      Mental Status: She is alert and oriented to person, place, and time.     Coordination: Coordination normal.  Psychiatric:        Behavior: Behavior normal. Behavior is cooperative.        Thought Content: Thought content normal.        Judgment: Judgment normal.          Patient has been counseled extensively about nutrition and exercise as well as the importance of adherence with medications and regular follow-up. The patient was given clear instructions to go to ER or return to medical center if symptoms don't improve, worsen or new problems develop. The patient verbalized understanding.   Follow-up: Return in about 3 months (around 04/14/2024) for PCP .   Haze LELON Servant, FNP-BC [facility] [place], [place] [phone]   01/30/2024, 9:50 PM

## 2024-01-14 ENCOUNTER — Other Ambulatory Visit (HOSPITAL_COMMUNITY): Payer: Self-pay

## 2024-01-16 ENCOUNTER — Other Ambulatory Visit: Payer: Self-pay

## 2024-01-16 ENCOUNTER — Other Ambulatory Visit (HOSPITAL_COMMUNITY): Payer: Self-pay

## 2024-01-17 ENCOUNTER — Other Ambulatory Visit: Payer: Self-pay

## 2024-01-17 NOTE — Telephone Encounter (Unsigned)
 Copied from CRM #[id number]. Topic: Clinical - Medical Advice >> Jan 17, 2024  9:43 AM Sophia H wrote: Reason for CRM: Patient states she has been feeling very low on energy lately, wanting to know if there are any recommendations for anything over the counter she can take or anything that can be prescribed. Please reach out, Ty # [phone]

## 2024-01-17 NOTE — Telephone Encounter (Signed)
 Copied from CRM #[id number]. Topic: Clinical - Medical Advice >> Jan 17, 2024  9:43 AM Sophia H wrote: Reason for CRM: Patient states she has been feeling very low on energy lately, wanting to know if there are any recommendations for anything over the counter she can take or anything that can be prescribed. Please reach out, Ty # [phone]

## 2024-01-17 NOTE — Telephone Encounter (Signed)
 Call to schedule patient for OV or VV

## 2024-01-18 ENCOUNTER — Other Ambulatory Visit: Payer: Self-pay

## 2024-01-18 ENCOUNTER — Other Ambulatory Visit: Payer: Self-pay | Admitting: Pharmacist

## 2024-01-18 ENCOUNTER — Other Ambulatory Visit (HOSPITAL_COMMUNITY): Payer: Self-pay

## 2024-01-18 MED ORDER — ACCU-CHEK SOFTCLIX LANCETS MISC
6 refills | Status: DC
  Filled 2024-01-18: qty 100, 34d supply, fill #0

## 2024-01-18 MED ORDER — ACCU-CHEK GUIDE TEST VI STRP
ORAL_STRIP | 6 refills | Status: DC
  Filled 2024-01-18: qty 100, 34d supply, fill #0
  Filled 2024-02-06: qty 100, 34d supply, fill #1

## 2024-01-18 MED ORDER — ACCU-CHEK GUIDE W/DEVICE KIT
PACK | 0 refills | Status: DC
  Filled 2024-01-18: qty 1, 30d supply, fill #0

## 2024-01-18 NOTE — Patient Instructions (Signed)
 Visit Information  Thank you for taking time to visit with me today. Please don't hesitate to contact me if I can be of assistance to you before our next scheduled appointment.  Your next care management appointment is by telephone on Thursday, September 4th at 12:00pm,    Please call the care guide team at [phone] if you need to cancel, schedule, or reschedule an appointment.   Please call the [place]  National Suicide Prevention Lifeline: 272-583-2563 or TTY: 629-208-9020 TTY 6368590175) to talk to a trained counselor if you are experiencing a Mental Health or Behavioral Health Crisis or need someone to talk to.  Santana Stamp BSN, CCM [place]  VBCI Population Health RN Care Manager Direct Dial: [phone]  Fax: [phone]

## 2024-01-18 NOTE — Patient Outreach (Signed)
 Complex Care Management   Visit Note  01/18/2024  Name:  Sarah Charles MRN: [id number] DOB: Nov 21, 1960  Situation: Referral received for Complex Care Management related to DM/HTN. I obtained verbal consent from Patient.  Visit completed with Patient  on the phone. Main concern today is getting Accu-Check glucometer and supplies switched over to [facility] at [facility].   Continues to have symptoms of itching to labia folds of vagina, using nystatin  cream as ordered.    Background:   Past Medical History:  Diagnosis Date   Arthritis    Coronary artery calcification seen on CT scan    a. 07/2017 noted on CT abd.   Depression    GERD (gastroesophageal reflux disease)    Hyperlipidemia    Hypertension    Obesity    Sleep apnea    a. Does not use CPAP cause i dont think I need it anymore.   Tobacco abuse    Type II diabetes mellitus (HCC)     Assessment: Patient Reported Symptoms:  Cognitive Cognitive Status: Alert and oriented to person, place, and time, Normal speech and language skills      Neurological Neurological Review of Symptoms: No symptoms reported    HEENT HEENT Symptoms Reported: No symptoms reported      Cardiovascular Cardiovascular Symptoms Reported: No symptoms reported    Respiratory Respiratory Symptoms Reported: No symptoms reported    Endocrine Endocrine Symptoms Reported: No symptoms reported List most recent blood sugar readings, include date and time of day: none taken today Endocrine Comment: States she is out of strips, needs new script for Accu-Check strips sent to [facility] at [facility].  Gastrointestinal Gastrointestinal Symptoms Reported: No symptoms reported      Genitourinary Genitourinary Symptoms Reported: Pain/burning with urination Additional Genitourinary Details: Applying nystatin  cream as ordered to labia site.    Integumentary Additional Integumentary Details: Applying nystatin   cream as ordered to lesions on labia folds of vagina    Musculoskeletal Musculoskelatal Symptoms Reviewed: No symptoms reported        Psychosocial Psychosocial Symptoms Reported: Not assessed          01/18/2024    PHQ2-9 Depression Screening   Little interest or pleasure in doing things    Feeling down, depressed, or hopeless    PHQ-2 - Total Score    Trouble falling or staying asleep, or sleeping too much    Feeling tired or having little energy    Poor appetite or overeating     Feeling bad about yourself - or that you are a failure or have let yourself or your family down    Trouble concentrating on things, such as reading the newspaper or watching television    Moving or speaking so slowly that other people could have noticed.  Or the opposite - being so fidgety or restless that you have been moving around a lot more than usual    Thoughts that you would be better off dead, or hurting yourself in some way    PHQ2-9 Total Score    If you checked off any problems, how difficult have these problems made it for you to do your work, take care of things at home, or get along with other people    Depression Interventions/Treatment      There were no vitals filed for this visit.  Medications Reviewed Today   Medications were not reviewed in this encounter     Recommendation:   PCP  follow up appointment 02/21/24 Educated patient on taking DM medications as ordered, taking heart medications as ordered.  Discussed effects of high blood sugars on organs.  Encouraged her to get down a routine for taking fasting blood sugars in the morning and log so she and I can discuss at next phone call.  Discussed taking blood pressures and log daily.    Follow Up Plan:   Telephone follow-up two weeks.   Santana Stamp BSN, CCM [place]  VBCI Population Health RN Care Manager Direct Dial: [phone]  Fax: [phone]

## 2024-01-19 ENCOUNTER — Other Ambulatory Visit: Payer: Self-pay

## 2024-01-24 ENCOUNTER — Other Ambulatory Visit (HOSPITAL_COMMUNITY): Payer: Self-pay

## 2024-01-24 ENCOUNTER — Other Ambulatory Visit: Payer: Self-pay | Admitting: Family Medicine

## 2024-01-24 NOTE — Telephone Encounter (Signed)
 Requested Prescriptions  Refused Prescriptions Disp Refills   lidocaine  (LIDODERM ) 5 % 30 patch 0    Sig: Remove & Discard patch within 12 hours or as directed by MD     Analgesics:  Topicals Failed - 01/24/2024  5:10 PM      Failed - Manual Review: Labs are only required if the patient has taken medication for more than 8 weeks.      Passed - PLT in normal range and within 360 days    Platelets  Date Value Ref Range Status  01/08/2024 254 150 - 400 K/uL Final  11/09/2018 321 150 - 450 x10E3/uL Final         Passed - HGB in normal range and within 360 days    Hemoglobin  Date Value Ref Range Status  01/08/2024 14.3 12.0 - 15.0 g/dL Final  93/81/7979 85.7 11.1 - 15.9 g/dL Final         Passed - HCT in normal range and within 360 days    HCT  Date Value Ref Range Status  01/08/2024 42.0 36.0 - 46.0 % Final   Hematocrit  Date Value Ref Range Status  11/09/2018 42.8 34.0 - 46.6 % Final         Passed - Cr in normal range and within 360 days    Creat  Date Value Ref Range Status  07/23/2016 0.50 0.50 - 1.05 mg/dL Final    Comment:      For patients > or = 63 years of age: The upper reference limit for Creatinine is approximately 13% higher for people identified as African-American.      Creatinine, Ser  Date Value Ref Range Status  01/08/2024 0.70 0.44 - 1.00 mg/dL Final   Creatinine, POC  Date Value Ref Range Status  09/16/2016 100 mg/dL Final   Creatinine, Urine  Date Value Ref Range Status  07/23/2016 47 20 - 320 mg/dL Final         Passed - eGFR is 30 or above and within 360 days    GFR, Est African American  Date Value Ref Range Status  07/23/2016 >89 >=60 mL/min Final   GFR calc Af Amer  Date Value Ref Range Status  12/10/2019 >60 >60 mL/min Final   GFR, Est Non African American  Date Value Ref Range Status  07/23/2016 >89 >=60 mL/min Final   GFR, Estimated  Date Value Ref Range Status  12/21/2023 >60 >60 mL/min Final    Comment:     (NOTE) Calculated using the CKD-EPI Creatinine Equation (2021)    eGFR  Date Value Ref Range Status  02/25/2022 97 >59 mL/min/1.73 Final         Passed - Patient is not pregnant      Passed - Valid encounter within last 12 months    Recent Outpatient Visits           1 week ago Hypertension associated with diabetes (HCC)   [facility] [place]. [facility] Theotis Ohm W, NP   1 month ago Type 2 diabetes mellitus with hyperlipidemia [facility])   [facility] [place]. [facility] Delbert Clam, MD   4 months ago Type 2 diabetes mellitus with hyperglycemia, with long-term current use of insulin  [facility])   [facility] [place]. [facility] [place], [place], [place]   7 months ago Type 2  diabetes mellitus with other specified complication, with long-term current use of insulin  [facility])   [facility] [place]. [facility], Jon M, [place]   8 months ago Type 2 diabetes mellitus with other specified complication, with long-term current use of insulin  [facility])   [facility] Of [place]. [facility] [place], Jon HERO, [place]       Future Appointments             In 2 months Delbert Clam, MD [facility] - A Dept Of . [facility], [place]

## 2024-01-26 ENCOUNTER — Telehealth: Payer: Self-pay

## 2024-01-27 ENCOUNTER — Other Ambulatory Visit (HOSPITAL_COMMUNITY): Payer: Self-pay

## 2024-01-30 ENCOUNTER — Encounter: Payer: Self-pay | Admitting: Nurse Practitioner

## 2024-02-06 ENCOUNTER — Other Ambulatory Visit (HOSPITAL_COMMUNITY): Payer: Self-pay

## 2024-02-06 ENCOUNTER — Other Ambulatory Visit: Payer: Self-pay

## 2024-02-07 ENCOUNTER — Ambulatory Visit: Payer: 59 | Attending: Family Medicine

## 2024-02-07 VITALS — Ht 63.0 in | Wt 181.0 lb

## 2024-02-07 DIAGNOSIS — Z0000 Encounter for general adult medical examination without abnormal findings: Secondary | ICD-10-CM
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
## 2024-02-07 NOTE — Patient Instructions (Signed)
 Sarah Charles,  Thank you for taking the time for your Medicare Wellness Visit. I appreciate your continued commitment to your health goals. Please review the care plan we discussed, and feel free to reach out if I can assist you further.  Medicare recommends these wellness visits once per year to help you and your care team stay ahead of potential health issues. These visits are designed to focus on prevention, allowing your provider to concentrate on managing your acute and chronic conditions during your regular appointments.  Please note that Annual Wellness Visits do not include a physical exam. Some assessments may be limited, especially if the visit was conducted virtually. If needed, we may recommend a separate in-person follow-up with your provider.  Ongoing Care Seeing your primary care provider every 3 to 6 months helps us  monitor your health and provide consistent, personalized care.   Referrals If a referral was made during today's visit and you haven't received any updates within two weeks, please contact the referred provider directly to check on the status.  Recommended Screenings:  Health Maintenance  Topic Date Due   Eye exam for diabetics  06/29/2022   Yearly kidney health urinalysis for diabetes  11/13/2022   Complete foot exam   02/26/2023   Flu Shot  12/23/2023   COVID-19 Vaccine (5 - 2025-26 season) 01/23/2024   Hemoglobin A1C  06/16/2024   Yearly kidney function blood test for diabetes  12/20/2024   Screening for Lung Cancer  12/21/2024   Medicare Annual Wellness Visit  02/06/2025   Cologuard (Stool DNA test)  06/15/2025   Breast Cancer Screening  07/28/2025   Pap with HPV screening  12/24/2025   DTaP/Tdap/Td vaccine (3 - Td or Tdap) 08/17/2026   Pneumococcal Vaccine for age over 61  Completed   Hepatitis C Screening  Completed   HIV Screening  Completed   Zoster (Shingles) Vaccine  Completed   Hepatitis B Vaccine  Aged Out   HPV Vaccine  Aged Out   Meningitis  B Vaccine  Aged Out       02/07/2024   12:05 PM  Advanced Directives  Does Patient Have a Medical Advance Directive? No  Would patient like information on creating a medical advance directive? No - Patient declined   Advance Care Planning is important because it: Ensures you receive medical care that aligns with your values, goals, and preferences. Provides guidance to your family and loved ones, reducing the emotional burden of decision-making during critical moments.  Vision: Annual vision screenings are recommended for early detection of glaucoma, cataracts, and diabetic retinopathy. These exams can also reveal signs of chronic conditions such as diabetes and high blood pressure.  Dental: Annual dental screenings help detect early signs of oral cancer, gum disease, and other conditions linked to overall health, including heart disease and diabetes.  Please see the attached documents for additional preventive care recommendations.

## 2024-02-09 ENCOUNTER — Telehealth: Payer: Self-pay

## 2024-02-14 ENCOUNTER — Ambulatory Visit: Admitting: Physical Medicine and Rehabilitation

## 2024-02-16 ENCOUNTER — Other Ambulatory Visit (HOSPITAL_COMMUNITY): Payer: Self-pay

## 2024-02-21 ENCOUNTER — Ambulatory Visit: Admitting: Family Medicine

## 2024-02-27 ENCOUNTER — Telehealth: Payer: Self-pay

## 2024-03-02 ENCOUNTER — Telehealth

## 2024-03-14 ENCOUNTER — Other Ambulatory Visit (HOSPITAL_COMMUNITY): Payer: Self-pay

## 2024-03-14 ENCOUNTER — Other Ambulatory Visit: Payer: Self-pay

## 2024-03-14 NOTE — Patient Outreach (Signed)
 Complex Care Management   Visit Note  03/14/2024  Name:  Sarah Charles MRN: [id number] DOB: [birth year]  Situation: Referral received for Complex Care Management related to DMII I obtained verbal consent from Parent.  Visit completed with Sarah Charles  on the phone.  Patient is working to lower A1C, last was 13.0% on 12/15/23, an increase from 8.5 on 09/15/23.   Background:   Past Medical History:  Diagnosis Date   Arthritis    Coronary artery calcification seen on CT scan    a. 07/2017 noted on CT abd.   Depression    GERD (gastroesophageal reflux disease)    Hyperlipidemia    Hypertension    Obesity    Sleep apnea    a. Does not use CPAP cause i dont think I need it anymore.   Tobacco abuse    Type II diabetes mellitus (HCC)     Assessment: Patient Reported Symptoms:  Cognitive Cognitive Status: Alert and oriented to person, place, and time, Insightful and able to interpret abstract concepts, Normal speech and language skills      Neurological Neurological Review of Symptoms: No symptoms reported    HEENT HEENT Symptoms Reported: No symptoms reported      Cardiovascular Cardiovascular Symptoms Reported: No symptoms reported    Respiratory Respiratory Symptoms Reported: No symptoms reported    Endocrine Endocrine Symptoms Reported: Increased urination Is patient diabetic?: Yes Is patient checking blood sugars at home?: Yes List most recent blood sugar readings, include date and time of day: Patient reports blood sugar of 135 taken during the day on 03/13/21 Endocrine Self-Management Outcome: 3 (uncertain)  Gastrointestinal Gastrointestinal Symptoms Reported: No symptoms reported      Genitourinary Genitourinary Symptoms Reported: Urgency    Integumentary Integumentary Symptoms Reported: No symptoms reported    Musculoskeletal Musculoskelatal Symptoms Reviewed: No symptoms reported   Falls in the past year?: No    Psychosocial Psychosocial Symptoms Reported:  Sadness - if selected complete PHQ 2-9 Behavioral Health Comment: Patient states she has been a challenging month due to the month of October triggers sadness because her deceased husband's birthday is this month, and their wedding anniversary is 10/31.  This RNCM offered LCSW services but patient declines referral, states she celebrates his birthday by purchasing a small cake and releasing a balloon in his honor, and that she is able to get herself out of down moments by speaking to her siblings.  Made sure patient knows to call this RNCM when needed. Major Change/Loss/Stressor/Fears (CP): Death of a loved one Techniques to Cope with Loss/Stress/Change: Diversional activities Quality of Family Relationships: supportive Do you feel physically threatened by others?: No    03/14/2024    PHQ2-9 Depression Screening   Little interest or pleasure in doing things Not at all  Feeling down, depressed, or hopeless Several days  PHQ-2 - Total Score 1  Trouble falling or staying asleep, or sleeping too much    Feeling tired or having little energy    Poor appetite or overeating     Feeling bad about yourself - or that you are a failure or have let yourself or your family down    Trouble concentrating on things, such as reading the newspaper or watching television    Moving or speaking so slowly that other people could have noticed.  Or the opposite - being so fidgety or restless that you have been moving around a lot more than usual    Thoughts that you would be better  off dead, or hurting yourself in some way    PHQ2-9 Total Score    If you checked off any problems, how difficult have these problems made it for you to do your work, take care of things at home, or get along with other people    Depression Interventions/Treatment      There were no vitals filed for this visit.  MEDICATIONS: Patient reports taking insulin  as prescribed, [facility] delivers medication to her home.  She  inquired if it was time for her gabapentin , reviewed med reports, informed patient she has 3 refills and it appears she can call the pharmacy and provide RX # for refill.     Recommendation:   Continue Current Plan of Care Discussed upcoming PCP follow up scheduled for 04/16/24 Reinforced importance of taking medications and insulin  as prescribed to improve blood sugars and A1C, instructed patient to take FBG each morning and log so we can discuss on next contact in two weeks.   Follow Up Plan:   Telephone follow-up two weeks.  Santana Stamp BSN, CCM [place]  [facility] Population Health RN Care Manager Direct Dial: [phone]  Fax: [phone]

## 2024-03-14 NOTE — Patient Instructions (Signed)
 Visit Information  Thank you for taking time to visit with me today. Please don't hesitate to contact me if I can be of assistance to you before our next scheduled appointment.  Your next care management appointment is by telephone on Wednesday, November 5th at 10:30am.  Please call the care guide team at [phone] if you need to cancel, schedule, or reschedule an appointment.   A reminder to ALL patients/family/friends, please call the [place]  National Suicide Prevention Lifeline: 781 829 1469 or TTY: 787 018 0879 TTY [phone]) to talk to a trained counselor if you are experiencing a Mental Health or Behavioral Health Crisis or need someone to talk to.  Santana Stamp BSN, CCM [place]  [facility]BC[facility] Population Health RN Care Manager Direct Dial: [phone]  Fax: [phone]

## 2024-03-26 ENCOUNTER — Ambulatory Visit: Payer: Self-pay

## 2024-03-26 NOTE — Telephone Encounter (Signed)
 Attempt to call patient to schedule an appointment for UTI.   Video visit available at 0810 and 1330. In person, 1510.  No answer.

## 2024-03-26 NOTE — Telephone Encounter (Signed)
 FYI Only or Action Required?: FYI only for provider: patient will call back tomorrow for an appointment or will be seen at mobile medicine clinic or UC.  Patient was last seen in primary care on 01/13/2024 by Theotis Haze ORN, NP.  Called Nurse Triage reporting Urinary Frequency and Dysuria.  Symptoms began about a month ago.  Interventions attempted: Prescription medications: she states she was given an antibiotic at urgent care about a month ago.  Symptoms are: urinary frequency, painful urination, urinary incontinence gradually worsening.  Triage Disposition: See Physician Within 24 Hours  Patient/caregiver understands and will follow disposition?: Yes             Summary: Possible UTI, seeking appt   Reason for Triage: Pt called to schedule an appt for frequent urination + mild pain  Best contact: [phone]  She declined options today because she has errands and nothing else is available until December. She is considering urgent care.     Reason for Disposition  Urinating more frequently than usual (i.e., frequency) OR new-onset of the feeling of an urgent need to urinate (i.e., urgency)  Answer Assessment - Initial Assessment Questions 1. SEVERITY: How bad is the pain?  (e.g., Scale 1-10; mild, moderate, or severe)     It's like my blood vessels are popping. Moderate.  2. FREQUENCY: How many times have you had painful urination today?      15 times; she states about every 10 minutes.  3. PATTERN: Is pain present every time you urinate or just sometimes?      Every time.  4. ONSET: When did the painful urination start?      Over a month ago.  5. FEVER: Do you have a fever? If Yes, ask: What is your temperature, how was it measured, and when did it start?     No.  6. PAST UTI: Have you had a urine infection before? If Yes, ask: When was the last time? and What happened that time?      Yes, she states years ago when she was pregnant.  7.  CAUSE: What do you think is causing the painful urination?  (e.g., UTI, scratch, Herpes sore)     She states she went to urgent care about a month ago and was given an antibiotic.  8. OTHER SYMPTOMS: Do you have any other symptoms? (e.g., blood in urine, flank pain, genital sores, urgency, vaginal discharge)     Denies nausea, vomiting, blood in urine, falls or back injuries. She states she has urinary incontinence x 2 weeks; chronic back pain; urinary frequency.  Protocols used: Urination Pain - Female-A-AH, Urinary Symptoms-A-AH

## 2024-03-26 NOTE — Telephone Encounter (Signed)
 1st attempt- LVM

## 2024-03-27 ENCOUNTER — Telehealth: Payer: Self-pay | Admitting: Family Medicine

## 2024-03-27 NOTE — Telephone Encounter (Signed)
 Patient returned phone call.  Appointment scheduled for 03/28/2024 at 9 am with IVAR Borer at [facility].  Address provided.  Patient will contact office if symptoms worsen.

## 2024-03-27 NOTE — Telephone Encounter (Signed)
 Copied from CRM [id number]. Topic: Referral - Question >> Mar 27, 2024 10:29 AM Pinkey ORN wrote:  Reason for CRM: Referral  >> Mar 27, 2024 10:30 AM Pinkey ORN wrote:  Patient states she's was under the impression she would be referred out to a specialist for her back, patient states she has arthritis. Please follow up with patient.

## 2024-03-28 ENCOUNTER — Telehealth: Payer: Self-pay

## 2024-03-28 ENCOUNTER — Other Ambulatory Visit (HOSPITAL_COMMUNITY): Payer: Self-pay

## 2024-03-28 ENCOUNTER — Encounter: Payer: Self-pay | Admitting: Nurse Practitioner

## 2024-03-28 ENCOUNTER — Ambulatory Visit (INDEPENDENT_AMBULATORY_CARE_PROVIDER_SITE_OTHER): Admitting: Nurse Practitioner

## 2024-03-28 VITALS — BP 179/88 | HR 76 | Ht 63.0 in | Wt 177.6 lb

## 2024-03-28 DIAGNOSIS — E1159 Type 2 diabetes mellitus with other circulatory complications: Secondary | ICD-10-CM

## 2024-03-28 DIAGNOSIS — N390 Urinary tract infection, site not specified: Secondary | ICD-10-CM | POA: Diagnosis not present

## 2024-03-28 DIAGNOSIS — R350 Frequency of micturition: Secondary | ICD-10-CM

## 2024-03-28 DIAGNOSIS — I152 Hypertension secondary to endocrine disorders: Secondary | ICD-10-CM

## 2024-03-28 LAB — POCT URINE DIPSTICK
Bilirubin, UA: NEGATIVE
Glucose, UA: 500 mg/dL — AB
Ketones, POC UA: NEGATIVE mg/dL
Leukocytes, UA: NEGATIVE
Nitrite, UA: POSITIVE — AB
Spec Grav, UA: 1.02 (ref 1.010–1.025)
Urobilinogen, UA: 0.2 U/dL
pH, UA: 7 (ref 5.0–8.0)

## 2024-03-28 MED ORDER — SULFAMETHOXAZOLE-TRIMETHOPRIM 800-160 MG PO TABS
1.0000 | ORAL_TABLET | Freq: Two times a day (BID) | ORAL | 0 refills | Status: AC
  Filled 2024-03-28 (×2): qty 6, 3d supply, fill #0

## 2024-03-28 MED ORDER — CLONIDINE HCL 0.1 MG PO TABS
0.1000 mg | ORAL_TABLET | Freq: Once | ORAL | Status: AC
  Administered 2024-03-28: 0.1 mg via ORAL

## 2024-03-28 NOTE — Telephone Encounter (Signed)
 Patient was called and a VM was left informing patient that she had referral placed and I left contact info for [facility].

## 2024-03-28 NOTE — Progress Notes (Signed)
 Subjective   Patient ID: Sarah Charles, female    DOB: 02/14/1961, 63 y.o.   MRN: [id number]  Chief Complaint  Patient presents with   Urinary Tract Infection    Painful urination, increased urinary frequency     Referring provider: Delbert Clam, MD  Sarah Charles is a 63 y.o. female with Past Medical History: No date: Arthritis No date: Coronary artery calcification seen on CT scan     Comment:  a. 07/2017 noted on CT abd. No date: Depression No date: GERD (gastroesophageal reflux disease) No date: Hyperlipidemia No date: Hypertension No date: Obesity No date: Sleep apnea     Comment:  a. Does not use CPAP cause i dont think I need it               anymore. No date: Tobacco abuse No date: Type II diabetes mellitus (HCC)   HPI  Patient presents today for an acute visit.  She states that she has been having urinary frequency and dysuria for the past several days.  UA in office today was nitrite positive.  We will start Bactrim  and send urine for culture.  Blood pressure was elevated in office today.  Patient states that she did not take her blood pressure medicine this morning.  Clonidine  given in office this morning.  Discussed with patient that she does need to eat and take her medications when she gets home.  Patient does have an upcoming appointment with her PCP - Dr. Newlin - in 3 weeks.      Allergies  Allergen Reactions   Cymbalta [Duloxetine Hcl] Nausea Only and Other (See Comments)    Nausea, lack of therapeutic effect    Immunization History  Administered Date(s) Administered   Influenza Inj Mdck Quad Pf 01/24/2020   Influenza Split 02/13/2015   Influenza, Seasonal, Injecte, Preservative Fre 02/06/2023   Influenza,inj,Quad PF,6+ Mos 02/15/2018, 02/25/2022   Influenza-Unspecified 02/24/2017, 04/23/2021   PFIZER(Purple Top)SARS-COV-2 Vaccination 09/14/2019, 10/11/2019, 05/27/2020   PNEUMOCOCCAL CONJUGATE-20 02/25/2022, 02/06/2023   Pfizer Covid-19  Vaccine Bivalent Booster 5yrs & up 03/18/2021   Pneumococcal Conjugate-13 08/16/2016   Pneumococcal Polysaccharide-23 09/16/2016   Td 02/21/2001   Tdap 08/16/2016   Zoster Recombinant(Shingrix) 08/20/2019, 08/31/2021    Tobacco History: Social History   Tobacco Use  Smoking Status Every Day   Current packs/day: 0.00   Average packs/day: 1 pack/day for 41.0 years (41.0 ttl pk-yrs)   Types: Cigarettes   Start date: 10/06/1977   Last attempt to quit: 10/07/2018   Years since quitting: 5.4  Smokeless Tobacco Never   Ready to quit: Not Answered Counseling given: Not Answered   Outpatient Encounter Medications as of 03/28/2024  Medication Sig   Accu-Chek Softclix Lancets lancets Use to check blood sugar 3 times daily. E11.65   aspirin  EC 81 MG tablet Take 1 tablet (81 mg total) by mouth daily. Swallow whole.   Blood Glucose Monitoring Suppl (ACCU-CHEK GUIDE) w/Device KIT Use to check blood sugar 3 times daily. E11.65   buPROPion  (WELLBUTRIN  SR) 150 MG 12 hr tablet Take 1 tablet (150 mg total) by mouth 2 (two) times daily.   carvedilol  (COREG ) 3.125 MG tablet Take 1 tablet (3.125 mg total) by mouth 2 (two) times daily with a meal.   fenofibrate  (TRICOR ) 145 MG tablet Take 1 tablet (145 mg total) by mouth every evening.   FISH OIL-CHOLECALCIFEROL PO Take by mouth.   FLUoxetine  (PROZAC ) 20 MG capsule Take 1 capsule (20 mg total) by mouth  every morning.   furosemide  (LASIX ) 20 MG tablet Take 1 tablet (20 mg total) by mouth daily.   gabapentin  (NEURONTIN ) 300 MG capsule Take 2 capsules (600 mg total) by mouth 3 (three) times daily.   glucose blood (ACCU-CHEK GUIDE TEST) test strip Use to check blood sugar 3 times daily. E11.65   hydrOXYzine  (ATARAX ) 25 MG tablet Take 1 tablet (25 mg total) by mouth 3 (three) times daily as needed. For anxiety   insulin  glargine (LANTUS  SOLOSTAR) 100 UNIT/ML Solostar Pen Inject 33 Units into the skin 2 (two) times daily.   Insulin  Pen Needle (COMFORT EZ PEN  NEEDLES) 31G X 5 MM MISC Inject 1 each into the skin in the morning and at bedtime. use as directed   Lancets Misc. (ACCU-CHEK FASTCLIX LANCET) KIT USE AS DIRECTED   lisinopril  (ZESTRIL ) 40 MG tablet Take 1 tablet (40 mg total) by mouth daily.   metFORMIN  (GLUCOPHAGE ) 500 MG tablet Take 2 tablets (1,000 mg total) by mouth 2 (two) times daily.   methocarbamol  (ROBAXIN ) 750 MG tablet Take 1 tablet (750 mg total) by mouth every 8 (eight) hours as needed for muscle spasms.   metoCLOPramide  (REGLAN ) 10 MG tablet Take 1 tablet (10 mg total) by mouth every 8 (eight) hours as needed for nausea and vomiting   nitroGLYCERIN  (NITROSTAT ) 0.4 MG SL tablet Place 0.4 mg under the tongue every 5 (five) minutes as needed for chest pain.   nystatin  cream (MYCOSTATIN ) Apply to affected area 2 times daily   nystatin  powder Apply 1 Application topically 3 (three) times daily.   oxyCODONE  (OXY IR/ROXICODONE ) 5 MG immediate release tablet Take 5 mg by mouth every 4 (four) hours as needed for moderate pain.   pantoprazole  (PROTONIX ) 40 MG tablet Take 1 tablet (40 mg total) by mouth 2 (two) times daily.   rosuvastatin  (CRESTOR ) 40 MG tablet Take 1 tablet (40 mg total) by mouth every evening.   sucralfate  (CARAFATE ) 1 g tablet Take 1 g by mouth 4 (four) times daily -  with meals and at bedtime.   sulfamethoxazole -trimethoprim  (BACTRIM  DS) 800-160 MG tablet Take 1 tablet by mouth 2 (two) times daily for 3 days.   traZODone  (DESYREL ) 50 MG tablet Take 1 tablet (50 mg total) by mouth at bedtime.   Facility-Administered Encounter Medications as of 03/28/2024  Medication   cloNIDine  (CATAPRES ) tablet 0.1 mg    Review of Systems  Review of Systems  Constitutional: Negative.   HENT: Negative.    Cardiovascular: Negative.   Gastrointestinal: Negative.   Genitourinary:  Positive for dysuria.  Allergic/Immunologic: Negative.   Neurological: Negative.   Psychiatric/Behavioral: Negative.       Objective:   BP (!)  187/85 (BP Location: Left Arm, Patient Position: Sitting, Cuff Size: Normal)   Pulse 70   Ht 5' 3 (1.6 m)   Wt 177 lb 9.6 oz (80.6 kg)   SpO2 95%   BMI 31.46 kg/m   Wt Readings from Last 5 Encounters:  03/28/24 177 lb 9.6 oz (80.6 kg)  02/07/24 181 lb (82.1 kg)  01/13/24 177 lb 6.4 oz (80.5 kg)  01/08/24 180 lb (81.6 kg)  12/18/23 183 lb (83 kg)     Physical Exam Vitals and nursing note reviewed.  Constitutional:      General: She is not in acute distress.    Appearance: She is well-developed.  Cardiovascular:     Rate and Rhythm: Normal rate and regular rhythm.  Pulmonary:     Effort: Pulmonary effort  is normal.     Breath sounds: Normal breath sounds.  Neurological:     Mental Status: She is alert and oriented to person, place, and time.       Assessment & Plan:   Urine frequency -     POCT URINE DIPSTICK -     Sulfamethoxazole -Trimethoprim ; Take 1 tablet by mouth 2 (two) times daily for 3 days.  Dispense: 6 tablet; Refill: 0  Urinary tract infection without hematuria, site unspecified -     Urine Culture -     Sulfamethoxazole -Trimethoprim ; Take 1 tablet by mouth 2 (two) times daily for 3 days.  Dispense: 6 tablet; Refill: 0  Hypertension associated with diabetes (HCC) -     cloNIDine  HCl     Return if symptoms worsen or fail to improve.     Bascom GORMAN Borer, NP 03/28/2024

## 2024-03-30 ENCOUNTER — Other Ambulatory Visit (HOSPITAL_COMMUNITY): Payer: Self-pay

## 2024-03-31 LAB — URINE CULTURE

## 2024-04-02 ENCOUNTER — Encounter: Payer: Self-pay | Admitting: Physician Assistant

## 2024-04-02 ENCOUNTER — Other Ambulatory Visit: Payer: Self-pay

## 2024-04-02 ENCOUNTER — Ambulatory Visit (INDEPENDENT_AMBULATORY_CARE_PROVIDER_SITE_OTHER): Admitting: Physician Assistant

## 2024-04-02 ENCOUNTER — Telehealth: Payer: Self-pay

## 2024-04-02 DIAGNOSIS — M5136 Other intervertebral disc degeneration, lumbar region with discogenic back pain only: Secondary | ICD-10-CM

## 2024-04-02 DIAGNOSIS — M545 Low back pain, unspecified: Secondary | ICD-10-CM

## 2024-04-02 NOTE — Progress Notes (Signed)
 Office Visit Note   Patient: Sarah Charles           Date of Birth: [birth year]           MRN: [id number] Visit Date: 04/02/2024              Requested by: Delbert Clam, MD [address] [place] [address] [place],  [place] [address] PCP: Delbert Clam, MD  Chief Complaint  Patient presents with   Lower Back - Pain      HPI: 63 y/o female with chronic back pain.  He lumbar pain ins worse with ambulation and standing.  It helps to sit and lay for rest.  She can walk a block at a time until her pain stops her.  She denies leg pain.  No radicular pain, claudication or rest pain.    He lumbar pain has been present for more than 10 years.  She has not tired heat or ice.  Past medical history includes: DM,( last A1c was 8.5 on 09/15/23) peripheral neuropathy, HTN, sleep apnea and hyperlipidemia, as well as depression.  Assessment & Plan: Visit Diagnoses:  1. Lumbar back pain   2. Degeneration of intervertebral disc of lumbar region with discogenic back pain     Plan: Conservative treatment.  Physical Therapy: A physical therapist can teach you proper posture and movement to reduce strain and strengthen your back. She is not interested in invasive treatments such as injection or surgery.  She has taken tylenol  and antiinflammatories in the past.  She was not interested in prednisone  due to her DM.    Follow-Up Instructions: Return if symptoms worsen or fail to improve.   Ortho Exam  Patient is alert, oriented, no adenopathy, well-dressed, normal affect, normal respiratory effort. SLR negative for radicular pain in B LE.  Internal/external hip rotation dose not elicit back or hip pain.  Palpable DP pulse, no ischemic skin changes.  She can stand on one foot at a time holding on to the counter.  She can go up on her toes.  Leaning forward helps her low back pain verses standing up.  Hips are level when standing.  Tenderness to central lumbar palpation and paraspinal muscle tenderness. No  scoliosis noted with forward flexion.      Imaging: AP/Lateral lumbar spine Calcified aorta Spondylolisthesis L4-5, DDD, Anterior lumbar spurring,  L1-2 loss of disc space.     Labs: Lab Results  Component Value Date   HGBA1C 13.0 (A) 12/15/2023   HGBA1C 8.5 (A) 09/15/2023   HGBA1C 8.2 (A) 05/16/2023   ESRSEDRATE 4 02/03/2023   ESRSEDRATE 18 10/23/2009   CRP 8.4 (H) 02/03/2023   REPTSTATUS 12/04/2023 FINAL 12/02/2023   GRAMSTAIN  12/22/2012    ABUNDANT WBC PRESENT, PREDOMINANTLY PMN NO SQUAMOUS EPITHELIAL CELLS SEEN MODERATE GRAM POSITIVE COCCI IN PAIRS Performed at [facility]   GRAMSTAIN  12/22/2012    ABUNDANT WBC PRESENT, PREDOMINANTLY PMN NO SQUAMOUS EPITHELIAL CELLS SEEN MODERATE GRAM POSITIVE COCCI IN PAIRS Performed at [facility]   CULT >=100,000 COLONIES/mL ESCHERICHIA COLI (A) 12/02/2023   LABORGA ESCHERICHIA COLI (A) 12/02/2023     Lab Results  Component Value Date   ALBUMIN 3.3 (L) 11/12/2023   ALBUMIN 3.8 02/03/2023   ALBUMIN 3.3 (L) 12/18/2022    Lab Results  Component Value Date   MG 1.6 (L) 02/04/2023   MG 1.6 (L) 12/22/2022   MG 1.5 (L) 12/19/2022   No results found for: VD25OH  No results found  for: PREALBUMIN    Latest Ref Rng & Units 01/08/2024    2:01 PM 01/08/2024    1:51 PM 12/21/2023   10:15 PM  CBC EXTENDED  WBC 4.0 - 10.5 K/uL  8.2    RBC 3.87 - 5.11 MIL/uL  4.65    Hemoglobin 12.0 - 15.0 g/dL 85.6  85.7  86.6   HCT 36.0 - 46.0 % 42.0  41.4  39.0   Platelets 150 - 400 K/uL  254       There is no height or weight on file to calculate BMI.  Orders:  Orders Placed This Encounter  Procedures   XR Lumbar Spine 2-3 Views   Ambulatory referral to Physical Therapy   No orders of the defined types were placed in this encounter.    Procedures: No procedures performed  Clinical Data: No additional findings.  ROS:  All other systems negative, except as noted in the HPI. Review of  Systems  Objective: Vital Signs: There were no vitals taken for this visit.  Specialty Comments:  No specialty comments available.  PMFS History: Patient Active Problem List   Diagnosis Date Noted   Abnormal findings on diagnostic imaging of digestive system 02/06/2023   Infectious colitis 02/06/2023   Acute blood loss anemia 02/05/2023   Pancolitis 02/04/2023   Rectal bleeding 02/04/2023   Acute diarrhea 02/04/2023   Imaging of gastrointestinal tract abnormal 02/04/2023   Colitis with rectal bleeding 02/03/2023   DKA (diabetic ketoacidosis) (HCC) 12/17/2022   Cellulitis 12/17/2022   Nausea and vomiting 12/17/2022   Hypertensive urgency 12/17/2022   Hyperlipidemia LDL goal <70 07/19/2022   Sleep apnea, unspecified 11/26/2020   CAD (coronary artery disease) 11/22/2017   Chest pain, precordial 11/04/2017   Acute chest pain    Type 2 diabetes mellitus with hyperlipidemia (HCC) 10/29/2017   Diabetic polyneuropathy associated with type 2 diabetes mellitus (HCC) 05/30/2017   Mixed dyslipidemia 05/30/2017   Depression 05/31/2016   Uncontrolled type 2 diabetes mellitus with complication 04/08/2016   Uncontrolled type 2 diabetes mellitus with complication, with long-term current use of insulin  04/20/2015   Cellulitis of breast 12/29/2013   Diabetes mellitus (HCC) 12/29/2013   Cellulitis of female breast 12/29/2013   Symptomatic menopausal or female climacteric states 04/08/2008   OTHER SPECIFIED DISEASE OF NAIL 04/08/2008   URINARY URGENCY 04/08/2008   COUGH 04/02/2008   FATIGUE 04/12/2007   Hypertension 04/12/2007   OBESITY, NOS 07/21/2006   Major depressive disorder, recurrent episode 07/21/2006   TOBACCO DEPENDENCE 07/21/2006   GASTROESOPHAGEAL REFLUX, NO ESOPHAGITIS 07/21/2006   INCONTINENCE, STRESS, FEMALE 07/21/2006   Past Medical History:  Diagnosis Date   Arthritis    Coronary artery calcification seen on CT scan    a. 07/2017 noted on CT abd.   Depression    GERD  (gastroesophageal reflux disease)    Hyperlipidemia    Hypertension    Obesity    Sleep apnea    a. Does not use CPAP cause i dont think I need it anymore.   Tobacco abuse    Type II diabetes mellitus (HCC)     Family History  Problem Relation Age of Onset   Hypertension Mother    CVA Mother    COPD Mother    Heart disease Mother    Kidney Stones Mother    Heart attack Mother        MI in late 29's   Breast cancer Mother    CAD Father    Hypercholesterolemia Father  Heart attack Father        Died @ 66 of MI   Diabetes Sister    Cancer Maternal Uncle    Diabetes Maternal Grandmother    Cancer Maternal Grandfather        lung cancer   Colon cancer Neg Hx    Esophageal cancer Neg Hx    Stomach cancer Neg Hx    Pancreatic cancer Neg Hx     Past Surgical History:  Procedure Laterality Date   BIOPSY  12/21/2022   Procedure: BIOPSY;  Surgeon: Saintclair Jasper, MD;  Location: [facility];  Service: Gastroenterology;;   CHOLECYSTECTOMY     CORONARY PRESSURE/FFR STUDY N/A 11/04/2017   Procedure: INTRAVASCULAR PRESSURE WIRE/FFR STUDY;  Surgeon: Jordan, Peter M, MD;  Location: [facility] INVASIVE CV LAB;  Service: Cardiovascular;  Laterality: N/A;   CORONARY STENT INTERVENTION N/A 11/04/2017   Procedure: CORONARY STENT INTERVENTION;  Surgeon: Jordan, Peter M, MD;  Location: [facility] INVASIVE CV LAB;  Service: Cardiovascular;  Laterality: N/A;   ESOPHAGOGASTRODUODENOSCOPY (EGD) WITH PROPOFOL  N/A 12/21/2022   Procedure: ESOPHAGOGASTRODUODENOSCOPY (EGD) WITH PROPOFOL ;  Surgeon: Saintclair Jasper, MD;  Location: [facility];  Service: Gastroenterology;  Laterality: N/A;   INCISION AND DRAINAGE ABSCESS Left 12/22/2012   Procedure: INCISION AND DRAINAGE ABSCESS;  Surgeon: Oneil DELENA Budge, MD;  Location: [facility];  Service: General;  Laterality: Left;   LEFT HEART CATH AND CORONARY ANGIOGRAPHY N/A 11/04/2017   Procedure: LEFT HEART CATH AND CORONARY ANGIOGRAPHY;  Surgeon: Jordan, Peter M, MD;  Location: [facility] INVASIVE  CV LAB;  Service: Cardiovascular;  Laterality: N/A;   TONSILLECTOMY     TUBAL LIGATION     Social History   Occupational History   Not on file  Tobacco Use   Smoking status: Every Day    Current packs/day: 0.00    Average packs/day: 1 pack/day for 41.0 years (41.0 ttl pk-yrs)    Types: Cigarettes    Start date: 10/06/1977    Last attempt to quit: 10/07/2018    Years since quitting: 5.4   Smokeless tobacco: Never  Vaping Use   Vaping status: Never Used  Substance and Sexual Activity   Alcohol use: No   Drug use: No   Sexual activity: Never    Birth control/protection: None

## 2024-04-03 ENCOUNTER — Other Ambulatory Visit (HOSPITAL_COMMUNITY): Payer: Self-pay

## 2024-04-04 ENCOUNTER — Telehealth: Payer: Self-pay | Admitting: Family Medicine

## 2024-04-04 NOTE — Telephone Encounter (Signed)
 Copied from CRM #[id number]. Topic: General - Other >> Apr 03, 2024  2:07 PM Charlet HERO wrote:  Reason for CRM: Patient is calling Santana Stamp back provided with the direct number to population health [phone]  >> Apr 04, 2024 11:59 AM Joesph B wrote:  Patient is calling back asking for Santana Perry to give her a call. From population health,

## 2024-04-09 ENCOUNTER — Telehealth: Payer: Self-pay

## 2024-04-09 NOTE — Telephone Encounter (Unsigned)
 Copied from CRM #[id number]. Topic: Clinical - Prescription Issue >> Apr 09, 2024 11:29 AM Rea ORN wrote: Reason for CRM:  Loras, calling from [place]  [employer] [phone] (option 3 for processing dept), asked if the fax sent on 11/05 for glucose monitoring supplies and request for last office notes was received.I did not see it in the pt's media and asked for them to fax again today.   Please call back to advise once received. [phone] Please fax back to [phone]

## 2024-04-09 NOTE — Telephone Encounter (Signed)
 Copied from CRM #[id number]. Topic: Clinical - Prescription Issue >> Apr 09, 2024 11:29 AM Rea ORN wrote: Reason for CRM:  Loras, calling from [place]  [employer] [phone] (option 3 for processing dept), asked if the fax sent on 11/05 for glucose monitoring supplies and request for last office notes was received.I did not see it in the pt's media and asked for them to fax again today.   Please call back to advise once received. [phone] Please fax back to [phone]

## 2024-04-10 NOTE — Telephone Encounter (Signed)
 Noted

## 2024-04-11 ENCOUNTER — Other Ambulatory Visit (HOSPITAL_COMMUNITY): Payer: Self-pay

## 2024-04-11 ENCOUNTER — Other Ambulatory Visit: Payer: Self-pay

## 2024-04-11 ENCOUNTER — Other Ambulatory Visit: Payer: Self-pay | Admitting: Pharmacist

## 2024-04-11 ENCOUNTER — Telehealth: Payer: Self-pay

## 2024-04-11 NOTE — Progress Notes (Signed)
 Pharmacy Quality Measure Review  This patient is appearing on a report for being at risk of failing the Glycemic Status Assessment in Diabetes measure this calendar year.   Last documented A1c on 12/15/23 was 13.0%. Takes metformin  and Lantus . Lantus  is due a refill and she fills with [facility]. Collaborated with pharmacy to fill this. Reminder set for 04/25/2024 metformin  refill.   Sarah Charles, PharmD, JAQUELINE, CPP Clinical Pharmacist [facility] [phone]

## 2024-04-13 ENCOUNTER — Telehealth: Payer: Self-pay

## 2024-04-14 ENCOUNTER — Encounter (HOSPITAL_COMMUNITY): Payer: Self-pay

## 2024-04-14 ENCOUNTER — Other Ambulatory Visit: Payer: Self-pay

## 2024-04-14 ENCOUNTER — Emergency Department (HOSPITAL_COMMUNITY): Admission: EM | Admit: 2024-04-14 | Discharge: 2024-04-14 | Disposition: A | Discharge status: Home / Self Care

## 2024-04-14 DIAGNOSIS — E119 Type 2 diabetes mellitus without complications: Secondary | ICD-10-CM | POA: Insufficient documentation

## 2024-04-14 DIAGNOSIS — Z7982 Long term (current) use of aspirin: Secondary | ICD-10-CM | POA: Diagnosis not present

## 2024-04-14 DIAGNOSIS — Z794 Long term (current) use of insulin: Secondary | ICD-10-CM | POA: Insufficient documentation

## 2024-04-14 DIAGNOSIS — Z79899 Other long term (current) drug therapy: Secondary | ICD-10-CM | POA: Insufficient documentation

## 2024-04-14 DIAGNOSIS — B3731 Acute candidiasis of vulva and vagina: Secondary | ICD-10-CM | POA: Insufficient documentation

## 2024-04-14 DIAGNOSIS — R102 Pelvic and perineal pain unspecified side: Secondary | ICD-10-CM | POA: Diagnosis present

## 2024-04-14 DIAGNOSIS — Z7984 Long term (current) use of oral hypoglycemic drugs: Secondary | ICD-10-CM | POA: Insufficient documentation

## 2024-04-14 DIAGNOSIS — I10 Essential (primary) hypertension: Secondary | ICD-10-CM | POA: Diagnosis not present

## 2024-04-14 LAB — CBC WITH DIFFERENTIAL/PLATELET
Abs Immature Granulocytes: 0.03 K/uL (ref 0.00–0.07)
Basophils Absolute: 0 K/uL (ref 0.0–0.1)
Basophils Relative: 1 %
Eosinophils Absolute: 0.1 K/uL (ref 0.0–0.5)
Eosinophils Relative: 2 %
HCT: 39.3 % (ref 36.0–46.0)
Hemoglobin: 13.6 g/dL (ref 12.0–15.0)
Immature Granulocytes: 1 %
Lymphocytes Relative: 24 %
Lymphs Abs: 1.6 K/uL (ref 0.7–4.0)
MCH: 30.3 pg (ref 26.0–34.0)
MCHC: 34.6 g/dL (ref 30.0–36.0)
MCV: 87.5 fL (ref 80.0–100.0)
Monocytes Absolute: 0.5 K/uL (ref 0.1–1.0)
Monocytes Relative: 8 %
Neutro Abs: 4.3 K/uL (ref 1.7–7.7)
Neutrophils Relative %: 64 %
Platelets: 199 K/uL (ref 150–400)
RBC: 4.49 MIL/uL (ref 3.87–5.11)
RDW: 13.1 % (ref 11.5–15.5)
WBC: 6.6 K/uL (ref 4.0–10.5)
nRBC: 0 % (ref 0.0–0.2)

## 2024-04-14 LAB — COMPREHENSIVE METABOLIC PANEL WITH GFR
ALT: 9 U/L (ref 0–44)
AST: 11 U/L — ABNORMAL LOW (ref 15–41)
Albumin: 2.9 g/dL — ABNORMAL LOW (ref 3.5–5.0)
Alkaline Phosphatase: 91 U/L (ref 38–126)
Anion gap: 12 (ref 5–15)
BUN: 7 mg/dL — ABNORMAL LOW (ref 8–23)
CO2: 24 mmol/L (ref 22–32)
Calcium: 8.8 mg/dL — ABNORMAL LOW (ref 8.9–10.3)
Chloride: 100 mmol/L (ref 98–111)
Creatinine, Ser: 0.65 mg/dL (ref 0.44–1.00)
GFR, Estimated: >60 mL/min (ref >60)
Glucose, Bld: 253 mg/dL — ABNORMAL HIGH (ref 70–99)
Potassium: 3.3 mmol/L — ABNORMAL LOW (ref 3.5–5.1)
Sodium: 136 mmol/L (ref 135–145)
Total Bilirubin: 0.6 mg/dL (ref 0.0–1.2)
Total Protein: 6.1 g/dL — ABNORMAL LOW (ref 6.5–8.1)

## 2024-04-14 LAB — URINALYSIS, W/ REFLEX TO CULTURE (INFECTION SUSPECTED)
Bilirubin Urine: NEGATIVE
Glucose, UA: >=500 mg/dL — AB
Ketones, ur: 80 mg/dL — AB
Nitrite: NEGATIVE
Protein, ur: 30 mg/dL — AB
Specific Gravity, Urine: 1.037 — ABNORMAL HIGH (ref 1.005–1.030)
pH: 5 (ref 5.0–8.0)

## 2024-04-14 MED ORDER — FLUCONAZOLE 150 MG PO TABS
150.0000 mg | ORAL_TABLET | Freq: Every day | ORAL | 1 refills | Status: DC
  Filled 2024-04-14 – 2024-04-16 (×2): qty 1, 1d supply, fill #0

## 2024-04-14 MED ORDER — LACTATED RINGERS IV BOLUS
1000.0000 mL | Freq: Once | INTRAVENOUS | Status: AC
  Administered 2024-04-14: 1000 mL via INTRAVENOUS

## 2024-04-14 MED ORDER — FLUCONAZOLE 150 MG PO TABS
150.0000 mg | ORAL_TABLET | Freq: Once | ORAL | Status: AC
  Administered 2024-04-14: 150 mg via ORAL
  Filled 2024-04-14: qty 1

## 2024-04-14 NOTE — ED Notes (Signed)
 Pt attempted to urinate in [facility], unsuccessful.

## 2024-04-14 NOTE — ED Provider Notes (Signed)
 [place] EMERGENCY DEPARTMENT AT [facility] Provider Note   CSN: [id number] Arrival date & time: 04/14/24  1349     Patient presents with: Vaginal Pain   Sarah Charles is a 63 y.o. female.   63 year old female with past medical history of diabetes, hypertension, and hyperlipidemia presenting to the emergency department today with vaginal pain.  Patient states this began yesterday.  States that she feels raw in her vaginal area.  Denies any vaginal bleeding or discharge.  She denies any internal pain.  States that the pain is external.  She came to the ER today for further evaluation due to these ongoing symptoms.  She denies any fevers or chills.  Denies any nausea or vomiting.   Vaginal Pain       Prior to Admission medications   Medication Sig Start Date End Date Taking? Authorizing Provider  fluconazole  (DIFLUCAN ) 150 MG tablet Take 1 tablet (150 mg total) by mouth daily. If your symptoms do not improve by 04-16-24 please take the second dose of Diflucan  by mouth.  If your symptoms have still not resolved by 04-19-24 please pick up the third dose of Diflucan . 04/14/24  Yes Ula Prentice SAUNDERS, MD  Accu-Chek Softclix Lancets lancets Use to check blood sugar 3 times daily. E11.65 01/18/24   Newlin, Enobong, MD  aspirin  EC 81 MG tablet Take 1 tablet (81 mg total) by mouth daily. Swallow whole. 08/25/23   Newlin, Enobong, MD  Blood Glucose Monitoring Suppl (ACCU-CHEK GUIDE) w/Device KIT Use to check blood sugar 3 times daily. E11.65 01/18/24   Newlin, Enobong, MD  buPROPion  (WELLBUTRIN  SR) 150 MG 12 hr tablet Take 1 tablet (150 mg total) by mouth 2 (two) times daily. 01/13/24   Fleming, Zelda W, NP  carvedilol  (COREG ) 3.125 MG tablet Take 1 tablet (3.125 mg total) by mouth 2 (two) times daily with a meal. 01/13/24   Theotis Haze ORN, NP  fenofibrate  (TRICOR ) 145 MG tablet Take 1 tablet (145 mg total) by mouth every evening. 01/13/24   Fleming, Zelda W, NP  FISH  OIL-CHOLECALCIFEROL PO Take by mouth.    [provider]  FLUoxetine  (PROZAC ) 20 MG capsule Take 1 capsule (20 mg total) by mouth every morning. 01/13/24   Fleming, Zelda W, NP  furosemide  (LASIX ) 20 MG tablet Take 1 tablet (20 mg total) by mouth daily. 01/13/24   Fleming, Zelda W, NP  gabapentin  (NEURONTIN ) 300 MG capsule Take 2 capsules (600 mg total) by mouth 3 (three) times daily. 01/13/24   Fleming, Zelda W, NP  glucose blood (ACCU-CHEK GUIDE TEST) test strip Use to check blood sugar 3 times daily. E11.65 01/18/24   Newlin, Enobong, MD  hydrOXYzine  (ATARAX ) 25 MG tablet Take 1 tablet (25 mg total) by mouth 3 (three) times daily as needed. For anxiety 12/15/23   Newlin, Enobong, MD  insulin  glargine (LANTUS  SOLOSTAR) 100 UNIT/ML Solostar Pen Inject 33 Units into the skin 2 (two) times daily. 01/13/24   Theotis Haze ORN, NP  Insulin  Pen Needle (COMFORT EZ PEN NEEDLES) 31G X 5 MM MISC Inject 1 each into the skin in the morning and at bedtime. use as directed 01/13/24   Fleming, Zelda W, NP  Lancets Misc. (ACCU-CHEK FASTCLIX LANCET) KIT USE AS DIRECTED 11/12/21   Newlin, Enobong, MD  lisinopril  (ZESTRIL ) 40 MG tablet Take 1 tablet (40 mg total) by mouth daily. 01/13/24   Fleming, Zelda W, NP  metFORMIN  (GLUCOPHAGE ) 500 MG tablet Take 2 tablets (1,000 mg total)  by mouth 2 (two) times daily. 01/13/24   Fleming, Zelda W, NP  methocarbamol  (ROBAXIN ) 750 MG tablet Take 1 tablet (750 mg total) by mouth every 8 (eight) hours as needed for muscle spasms. 01/13/24   Fleming, Zelda W, NP  metoCLOPramide  (REGLAN ) 10 MG tablet Take 1 tablet (10 mg total) by mouth every 8 (eight) hours as needed for nausea and vomiting 01/13/24   Theotis Haze ORN, NP  nitroGLYCERIN  (NITROSTAT ) 0.4 MG SL tablet Place 0.4 mg under the tongue every 5 (five) minutes as needed for chest pain.    [provider]  nystatin  cream (MYCOSTATIN ) Apply to affected area 2 times daily 01/13/24   Fleming, Zelda W, NP  nystatin  powder Apply  1 Application topically 3 (three) times daily. 01/13/24   Theotis Haze ORN, NP  oxyCODONE  (OXY IR/ROXICODONE ) 5 MG immediate release tablet Take 5 mg by mouth every 4 (four) hours as needed for moderate pain. 11/29/22   [provider]  pantoprazole  (PROTONIX ) 40 MG tablet Take 1 tablet (40 mg total) by mouth 2 (two) times daily. 01/13/24   Fleming, Zelda W, NP  rosuvastatin  (CRESTOR ) 40 MG tablet Take 1 tablet (40 mg total) by mouth every evening. 01/13/24   Fleming, Zelda W, NP  sucralfate  (CARAFATE ) 1 g tablet Take 1 g by mouth 4 (four) times daily -  with meals and at bedtime.    [provider]  traZODone  (DESYREL ) 50 MG tablet Take 1 tablet (50 mg total) by mouth at bedtime. 01/13/24   Theotis Haze ORN, NP    Allergies: Cymbalta [duloxetine hcl]    Review of Systems  Genitourinary:  Positive for vaginal pain.  All other systems reviewed and are negative.   Updated Vital Signs BP (!) 150/72 (BP Location: Right Arm)   Pulse 75   Temp 98 F (36.7 C) (Oral)   Resp 20   Ht 5' 3 (1.6 m)   Wt 80.3 kg   SpO2 96%   BMI 31.35 kg/m   Physical Exam Vitals and nursing note reviewed.   Gen: NAD Eyes: PERRL, EOMI HEENT: no oropharyngeal swelling Neck: trachea midline Resp: clear to auscultation bilaterally Card: RRR, no murmurs, rubs, or gallops Abd: nontender, nondistended Extremities: no calf tenderness, no edema Vascular: 2+ radial pulses bilaterally, 2+ DP pulses bilaterally Skin: no rashes Psyc: acting appropriately   (all labs ordered are listed, but only abnormal results are displayed) Labs Reviewed  URINALYSIS, W/ REFLEX TO CULTURE (INFECTION SUSPECTED) - Abnormal; Notable for the following components:      Result Value   APPearance HAZY (*)    Specific Gravity, Urine 1.037 (*)    Glucose, UA >=500 (*)    Hgb urine dipstick SMALL (*)    Ketones, ur 80 (*)    Protein, ur 30 (*)    Leukocytes,Ua MODERATE (*)    Bacteria, UA RARE (*)    All other  components within normal limits  COMPREHENSIVE METABOLIC PANEL WITH GFR - Abnormal; Notable for the following components:   Potassium 3.3 (*)    Glucose, Bld 253 (*)    BUN 7 (*)    Calcium  8.8 (*)    Total Protein 6.1 (*)    Albumin 2.9 (*)    AST 11 (*)    All other components within normal limits  CBC WITH DIFFERENTIAL/PLATELET    EKG: None  Radiology: No results found.   Procedures   Medications Ordered in the ED  lactated ringers  bolus 1,000 mL (  1,000 mLs Intravenous New Bag/Given 04/14/24 1605)  fluconazole  (DIFLUCAN ) tablet 150 mg (150 mg Oral Given 04/14/24 1814)                                    Medical Decision Making 63 year old female with past medical history of diabetes, hypertension, and hyperlipidemia presents emergency department today with vaginal pain.  The patient's exam chaperoned by female nursing staff does show a very well demarcated in the vaginal region as well as in the inguinal creases.  There are some satellite lesions noted as well.  There is no crepitus here.  The patient is well-appearing with stable vital signs and this is more consistent with vulvovaginal candidiasis albeit a very significant case of it.  This does not appear consistent with Fournier's gangrene.  Patient's urinalysis from triage does show some leukocytes which I expect are from the candidiasis.  She is denying any urinary complaints.  There were some ketones in her urine as well as glucose.  Will obtain labs here to evaluate for DKA.  I will give patient IV fluids in the meantime.  Will reevaluate for ultimate disposition.  Will give the patient oral Diflucan  for severe vulvovaginal candidiasis.  The patient's labs are reassuring.  Not consistent with DKA.  The patient remains hemodynamically stable here.  I think that she is stable for discharge.  She does have an OB/GYN to follow-up with.  Amount and/or Complexity of Data Reviewed Labs: ordered.  Risk Prescription drug  management.        Final diagnoses:  Vulvovaginal candidiasis    ED Discharge Orders          Ordered    fluconazole  (DIFLUCAN ) 150 MG tablet  Daily        04/14/24 1903               Ula Prentice SAUNDERS, MD 04/14/24 1904

## 2024-04-14 NOTE — ED Triage Notes (Signed)
 C/o vaginal pain onset yest worse today

## 2024-04-14 NOTE — Discharge Instructions (Addendum)
 If your symptoms do not improve by 04-16-24 please take the second dose of Diflucan  by mouth.  If your symptoms have still not resolved by 04-19-24 please pick up the third dose of Diflucan .  Please follow-up with your OB/GYN and return to the ER for worsening symptoms.  Please call your primary care doctor set up a follow-up appointment as well to make sure that your blood sugars are under better control as this can lead to recurrent infections.

## 2024-04-16 ENCOUNTER — Ambulatory Visit: Admitting: Family Medicine

## 2024-04-16 ENCOUNTER — Other Ambulatory Visit (HOSPITAL_COMMUNITY): Payer: Self-pay

## 2024-04-17 ENCOUNTER — Telehealth: Payer: Self-pay | Admitting: Family Medicine

## 2024-04-17 ENCOUNTER — Other Ambulatory Visit (HOSPITAL_COMMUNITY): Payer: Self-pay

## 2024-04-17 NOTE — Telephone Encounter (Signed)
 Can you check [employer] for the paperwork

## 2024-04-17 NOTE — Telephone Encounter (Signed)
 Copied from CRM #[id number]. Topic: Referral - Prior Authorization Question >> Apr 17, 2024  2:23 PM Donna BRAVO wrote:  Reason for CRM:   Authorization for GI, for Information to be shared with providers. needing a signature from provider and faxed back to [phone]  [employer] stated the form can be signed by another provider. If another provider signs, please include NPI   [employer] stated this is high priority and urgent matter

## 2024-04-17 NOTE — Telephone Encounter (Signed)
 Noted

## 2024-04-17 NOTE — Telephone Encounter (Signed)
 Hi Alycia,   Fax received, and placed In provider's box.

## 2024-04-18 NOTE — Telephone Encounter (Signed)
Form has been received and will be faxed once complete. 

## 2024-04-18 NOTE — Telephone Encounter (Signed)
>>   Apr 18, 2024 11:26 AM Sarah Charles MATSU wrote:  [place] w/[facility].. checking to see if we completed signed/faxed paperwork.. their call back [phone] ( they want to be able to share the results ) Pls call them back

## 2024-04-25 ENCOUNTER — Other Ambulatory Visit: Payer: Self-pay

## 2024-04-25 NOTE — Telephone Encounter (Signed)
 Copied from CRM #[id number]. Topic: Referral - Prior Authorization Question >> Apr 17, 2024  2:23 PM Donna BRAVO wrote: Reason for CRM:    Authorization for GI, for Information to be shared with providers. needing a signature from provider and faxed back to [phone]  [employer] stated the form can be signed by another provider. If another provider signs, please include NPI   [employer] stated this is high priority and urgent matter >> Apr 25, 2024  3:01 PM Joesph NOVAK wrote:  Omega is calling from [facility], stating he needs the form sent back asap so he can relay results to pt. [phone]  >> Apr 18, 2024 11:26 AM Ole MATSU wrote:  [place] w/[facility].. checking to see if we completed signed/faxed paperwork.. their call back [phone] ( they want to be able to share the results ) Pls call them back

## 2024-04-27 NOTE — Telephone Encounter (Signed)
Form has been placed in PCP folder for signature.

## 2024-05-01 NOTE — Telephone Encounter (Signed)
 Form has been faxed.

## 2024-05-03 ENCOUNTER — Telehealth: Payer: Self-pay

## 2024-05-04 ENCOUNTER — Telehealth: Payer: Self-pay | Admitting: Family Medicine

## 2024-05-04 NOTE — Telephone Encounter (Signed)
 Copied from CRM #[id number]. Topic: General - Other >> May 04, 2024  1:32 PM Wess RAMAN wrote:  Reason for CRM: Leonor from [facility] wanted to know if the fax was received on 12/4 for patient's diabetic supplies but they had the wrong fax number. Provided correct fax number. They will be sending it over today, 12/12 and it is urgent for return. Patient is almost out of supplies.   Callback #: [id number] Fax #: [phone]

## 2024-05-04 NOTE — Telephone Encounter (Signed)
 Fax received, and placed in provider's box.

## 2024-05-05 ENCOUNTER — Other Ambulatory Visit: Payer: Self-pay

## 2024-05-05 ENCOUNTER — Emergency Department (HOSPITAL_COMMUNITY)
Admission: EM | Admit: 2024-05-05 | Discharge: 2024-05-05 | Disposition: A | Discharge status: Home / Self Care | Attending: Internal Medicine | Admitting: Internal Medicine

## 2024-05-05 ENCOUNTER — Encounter (HOSPITAL_COMMUNITY): Payer: Self-pay

## 2024-05-05 DIAGNOSIS — E1165 Type 2 diabetes mellitus with hyperglycemia: Secondary | ICD-10-CM | POA: Insufficient documentation

## 2024-05-05 DIAGNOSIS — B3789 Other sites of candidiasis: Secondary | ICD-10-CM | POA: Insufficient documentation

## 2024-05-05 DIAGNOSIS — Z7982 Long term (current) use of aspirin: Secondary | ICD-10-CM | POA: Insufficient documentation

## 2024-05-05 DIAGNOSIS — Z7984 Long term (current) use of oral hypoglycemic drugs: Secondary | ICD-10-CM | POA: Insufficient documentation

## 2024-05-05 DIAGNOSIS — Z794 Long term (current) use of insulin: Secondary | ICD-10-CM | POA: Insufficient documentation

## 2024-05-05 DIAGNOSIS — B3731 Acute candidiasis of vulva and vagina: Secondary | ICD-10-CM | POA: Insufficient documentation

## 2024-05-05 DIAGNOSIS — Z720 Tobacco use: Secondary | ICD-10-CM | POA: Insufficient documentation

## 2024-05-05 DIAGNOSIS — I2510 Atherosclerotic heart disease of native coronary artery without angina pectoris: Secondary | ICD-10-CM | POA: Insufficient documentation

## 2024-05-05 LAB — CBG MONITORING, ED
Glucose-Capillary: 455 mg/dL — ABNORMAL HIGH (ref 70–99)
Glucose-Capillary: 245 mg/dL — ABNORMAL HIGH (ref 70–99)

## 2024-05-05 LAB — COMPREHENSIVE METABOLIC PANEL WITH GFR
ALT: 11 U/L (ref 0–44)
AST: 11 U/L — ABNORMAL LOW (ref 15–41)
Albumin: 3.3 g/dL — ABNORMAL LOW (ref 3.5–5.0)
Alkaline Phosphatase: 109 U/L (ref 38–126)
Anion gap: 14 (ref 5–15)
BUN: 10 mg/dL (ref 8–23)
CO2: 24 mmol/L (ref 22–32)
Calcium: 9.2 mg/dL (ref 8.9–10.3)
Chloride: 94 mmol/L — ABNORMAL LOW (ref 98–111)
Creatinine, Ser: 0.69 mg/dL (ref 0.44–1.00)
GFR, Estimated: >60 mL/min (ref >60)
Glucose, Bld: 590 mg/dL (ref 70–99)
Potassium: 3.9 mmol/L (ref 3.5–5.1)
Sodium: 132 mmol/L — ABNORMAL LOW (ref 135–145)
Total Bilirubin: 0.5 mg/dL (ref 0.0–1.2)
Total Protein: 6.7 g/dL (ref 6.5–8.1)

## 2024-05-05 LAB — I-STAT VENOUS BLOOD GAS, ED
Acid-Base Excess: 4 mmol/L — ABNORMAL HIGH (ref 0.0–2.0)
Bicarbonate: 29.6 mmol/L — ABNORMAL HIGH (ref 20.0–28.0)
Calcium, Ion: 1.22 mmol/L (ref 1.15–1.40)
HCT: 45 % (ref 36.0–46.0)
Hemoglobin: 15.3 g/dL — ABNORMAL HIGH (ref 12.0–15.0)
O2 Saturation: 73 %
Potassium: 4.3 mmol/L (ref 3.5–5.1)
Sodium: 133 mmol/L — ABNORMAL LOW (ref 135–145)
TCO2: 31 mmol/L (ref 22–32)
pCO2, Ven: 48.1 mmHg (ref 44–60)
pH, Ven: 7.398 (ref 7.25–7.43)
pO2, Ven: 39 mmHg (ref 32–45)

## 2024-05-05 LAB — URINALYSIS, ROUTINE W REFLEX MICROSCOPIC
Bilirubin Urine: NEGATIVE
Glucose, UA: >=500 mg/dL — AB
Ketones, ur: 20 mg/dL — AB
Nitrite: NEGATIVE
Protein, ur: NEGATIVE mg/dL
Specific Gravity, Urine: 1.033 — ABNORMAL HIGH (ref 1.005–1.030)
pH: 5 (ref 5.0–8.0)

## 2024-05-05 LAB — CBC WITH DIFFERENTIAL/PLATELET
Abs Immature Granulocytes: 0.01 K/uL (ref 0.00–0.07)
Basophils Absolute: 0.1 K/uL (ref 0.0–0.1)
Basophils Relative: 1 %
Eosinophils Absolute: 0.1 K/uL (ref 0.0–0.5)
Eosinophils Relative: 1 %
HCT: 44 % (ref 36.0–46.0)
Hemoglobin: 15 g/dL (ref 12.0–15.0)
Immature Granulocytes: 0 %
Lymphocytes Relative: 23 %
Lymphs Abs: 1.5 K/uL (ref 0.7–4.0)
MCH: 30.1 pg (ref 26.0–34.0)
MCHC: 34.1 g/dL (ref 30.0–36.0)
MCV: 88.4 fL (ref 80.0–100.0)
Monocytes Absolute: 0.6 K/uL (ref 0.1–1.0)
Monocytes Relative: 9 %
Neutro Abs: 4.3 K/uL (ref 1.7–7.7)
Neutrophils Relative %: 66 %
Platelets: 254 K/uL (ref 150–400)
RBC: 4.98 MIL/uL (ref 3.87–5.11)
RDW: 13.1 % (ref 11.5–15.5)
WBC: 6.5 K/uL (ref 4.0–10.5)
nRBC: 0 % (ref 0.0–0.2)

## 2024-05-05 LAB — BETA-HYDROXYBUTYRIC ACID: Beta-Hydroxybutyric Acid: 1.94 mmol/L — ABNORMAL HIGH (ref 0.05–0.27)

## 2024-05-05 MED ORDER — LACTATED RINGERS IV BOLUS
1000.0000 mL | Freq: Once | INTRAVENOUS | Status: AC
  Administered 2024-05-05: 1000 mL via INTRAVENOUS

## 2024-05-05 MED ORDER — NYSTATIN 100000 UNIT/GM EX CREA
TOPICAL_CREAM | Freq: Two times a day (BID) | CUTANEOUS | Status: DC
  Filled 2024-05-05: qty 30

## 2024-05-05 MED ORDER — INSULIN ASPART 100 UNIT/ML IJ SOLN
10.0000 [IU] | Freq: Once | INTRAMUSCULAR | Status: AC
  Administered 2024-05-05: 10 [IU] via SUBCUTANEOUS
  Filled 2024-05-05: qty 10

## 2024-05-05 MED ORDER — FLUCONAZOLE 150 MG PO TABS
150.0000 mg | ORAL_TABLET | Freq: Once | ORAL | Status: DC
  Filled 2024-05-05: qty 1

## 2024-05-05 MED ORDER — FLUCONAZOLE 150 MG PO TABS
150.0000 mg | ORAL_TABLET | Freq: Once | ORAL | Status: AC
  Administered 2024-05-05: 150 mg via ORAL
  Filled 2024-05-05: qty 1

## 2024-05-05 NOTE — ED Triage Notes (Signed)
 Pt was treated for vaginal fungal infection a few weeks ago and given diflucan  but pt reports no change in her symptoms.

## 2024-05-05 NOTE — ED Provider Notes (Cosign Needed Addendum)
[place] EMERGENCY DEPARTMENT AT [facility] Provider Note   CSN: [id number] Arrival date & time: 05/05/24  1247     Patient presents with: Vaginal Pain   Sarah Charles is a 63 y.o. female with a past medical history of tobacco use, GERD, insulin  dependent T2DM, HLD, CAD presents Emergency Department for evaluation of vaginal pain, bleeding with wiping, increased urinary frequency for the past 3 days.  Reports that every time that she goes to the bathroom, she sees small amount of blood on toilet paper. She reports her vaginal area feels raw.  She was seen on 04/14/2024 for similar symptoms and provided fluconazole  for suspected candidiasis which improved symptoms.     Vaginal Pain       Prior to Admission medications  Medication Sig Start Date End Date Taking? Authorizing Provider  Accu-Chek Softclix Lancets lancets Use to check blood sugar 3 times daily. E11.65 01/18/24   Delbert Clam, MD  aspirin  EC 81 MG tablet Take 1 tablet (81 mg total) by mouth daily. Swallow whole. 08/25/23   Newlin, Enobong, MD  Blood Glucose Monitoring Suppl (ACCU-CHEK GUIDE) w/Device KIT Use to check blood sugar 3 times daily. E11.65 01/18/24   Newlin, Enobong, MD  buPROPion  (WELLBUTRIN  SR) 150 MG 12 hr tablet Take 1 tablet (150 mg total) by mouth 2 (two) times daily. 01/13/24   Fleming, Zelda W, NP  carvedilol  (COREG ) 3.125 MG tablet Take 1 tablet (3.125 mg total) by mouth 2 (two) times daily with a meal. 01/13/24   Theotis Haze ORN, NP  fenofibrate  (TRICOR ) 145 MG tablet Take 1 tablet (145 mg total) by mouth every evening. 01/13/24   Fleming, Zelda W, NP  FISH OIL-CHOLECALCIFEROL PO Take by mouth.    [provider]  fluconazole  (DIFLUCAN ) 150 MG tablet Take 1 tablet (150 mg total) by mouth  once daily.as directed. If your symptoms do not improve by 04-16-24 please take the second dose of Diflucan  by mouth.  If your symptoms have still not resolved by 04-19-24 please pick up the  third dose of Diflucan . 04/14/24   Ula Prentice SAUNDERS, MD  FLUoxetine  (PROZAC ) 20 MG capsule Take 1 capsule (20 mg total) by mouth every morning. 01/13/24   Fleming, Zelda W, NP  furosemide  (LASIX ) 20 MG tablet Take 1 tablet (20 mg total) by mouth daily. 01/13/24   Fleming, Zelda W, NP  gabapentin  (NEURONTIN ) 300 MG capsule Take 2 capsules (600 mg total) by mouth 3 (three) times daily. 01/13/24   Fleming, Zelda W, NP  glucose blood (ACCU-CHEK GUIDE TEST) test strip Use to check blood sugar 3 times daily. E11.65 01/18/24   Newlin, Enobong, MD  hydrOXYzine  (ATARAX ) 25 MG tablet Take 1 tablet (25 mg total) by mouth 3 (three) times daily as needed. For anxiety 12/15/23   Newlin, Enobong, MD  insulin  glargine (LANTUS  SOLOSTAR) 100 UNIT/ML Solostar Pen Inject 33 Units into the skin 2 (two) times daily. 01/13/24   Theotis Haze ORN, NP  Insulin  Pen Needle (COMFORT EZ PEN NEEDLES) 31G X 5 MM MISC Inject 1 each into the skin in the morning and at bedtime. use as directed 01/13/24   Fleming, Zelda W, NP  Lancets Misc. (ACCU-CHEK FASTCLIX LANCET) KIT USE AS DIRECTED 11/12/21   Newlin, Enobong, MD  lisinopril  (ZESTRIL ) 40 MG tablet Take 1 tablet (40 mg total) by mouth daily. 01/13/24   Fleming, Zelda W, NP  metFORMIN  (GLUCOPHAGE ) 500 MG tablet Take 2 tablets (1,000 mg total) by mouth 2 (two)  times daily. 01/13/24   Fleming, Zelda W, NP  methocarbamol  (ROBAXIN ) 750 MG tablet Take 1 tablet (750 mg total) by mouth every 8 (eight) hours as needed for muscle spasms. 01/13/24   Fleming, Zelda W, NP  metoCLOPramide  (REGLAN ) 10 MG tablet Take 1 tablet (10 mg total) by mouth every 8 (eight) hours as needed for nausea and vomiting 01/13/24   Fleming, Zelda W, NP  nitroGLYCERIN  (NITROSTAT ) 0.4 MG SL tablet Place 0.4 mg under the tongue every 5 (five) minutes as needed for chest pain.    [provider]  nystatin  cream (MYCOSTATIN ) Apply to affected area 2 times daily 01/13/24   Fleming, Zelda W, NP  nystatin  powder Apply 1  Application topically 3 (three) times daily. 01/13/24   Fleming, Zelda W, NP  oxyCODONE  (OXY IR/ROXICODONE ) 5 MG immediate release tablet Take 5 mg by mouth every 4 (four) hours as needed for moderate pain. 11/29/22   [provider]  pantoprazole  (PROTONIX ) 40 MG tablet Take 1 tablet (40 mg total) by mouth 2 (two) times daily. 01/13/24   Fleming, Zelda W, NP  rosuvastatin  (CRESTOR ) 40 MG tablet Take 1 tablet (40 mg total) by mouth every evening. 01/13/24   Fleming, Zelda W, NP  sucralfate  (CARAFATE ) 1 g tablet Take 1 g by mouth 4 (four) times daily -  with meals and at bedtime.    [provider]  traZODone  (DESYREL ) 50 MG tablet Take 1 tablet (50 mg total) by mouth at bedtime. 01/13/24   Theotis Haze ORN, NP    Allergies: Cymbalta [duloxetine hcl]    Review of Systems  Genitourinary:  Positive for vaginal pain.    Updated Vital Signs BP (!) 165/100 (BP Location: Right Arm)   Pulse 80   Temp (!) 97.4 F (36.3 C) (Oral)   Resp 18   Ht 5' 3 (1.6 m)   Wt 81 kg   SpO2 98%   BMI 31.63 kg/m   Physical Exam Vitals and nursing note reviewed.  Constitutional:      General: She is not in acute distress.    Appearance: Normal appearance.  HENT:     Head: Normocephalic and atraumatic.  Eyes:     Conjunctiva/sclera: Conjunctivae normal.  Cardiovascular:     Rate and Rhythm: Normal rate.  Pulmonary:     Effort: Pulmonary effort is normal. No respiratory distress.     Breath sounds: Normal breath sounds.  Skin:    Coloration: Skin is not jaundiced or pale.     Comments: Erythema, plaque with satellite lesions under right breast fold, under panis, and within genital region consistent with candida dermatitis. No signs of bacterial skin infection, fluctuance, streaking, cellulitis.  Neurological:     Mental Status: She is alert. Mental status is at baseline.   GU exam chaperoned by Lateefah NT  (all labs ordered are listed, but only abnormal results are displayed) Labs  Reviewed  COMPREHENSIVE METABOLIC PANEL WITH GFR - Abnormal; Notable for the following components:      Result Value   Sodium 132 (*)    Chloride 94 (*)    Glucose, Bld 590 (*)    Albumin 3.3 (*)    AST 11 (*)    All other components within normal limits  URINALYSIS, ROUTINE W REFLEX MICROSCOPIC - Abnormal; Notable for the following components:   Color, Urine STRAW (*)    APPearance HAZY (*)    Specific Gravity, Urine 1.033 (*)    Glucose, UA >=500 (*)  Hgb urine dipstick SMALL (*)    Ketones, ur 20 (*)    Leukocytes,Ua SMALL (*)    Bacteria, UA RARE (*)    All other components within normal limits  BETA-HYDROXYBUTYRIC ACID - Abnormal; Notable for the following components:   Beta-Hydroxybutyric Acid 1.94 (*)    All other components within normal limits  I-STAT VENOUS BLOOD GAS, ED - Abnormal; Notable for the following components:   Bicarbonate 29.6 (*)    Acid-Base Excess 4.0 (*)    Sodium 133 (*)    Hemoglobin 15.3 (*)    All other components within normal limits  CBG MONITORING, ED - Abnormal; Notable for the following components:   Glucose-Capillary 455 (*)    All other components within normal limits  CBG MONITORING, ED - Abnormal; Notable for the following components:   Glucose-Capillary 245 (*)    All other components within normal limits  URINE CULTURE  CBC WITH DIFFERENTIAL/PLATELET    EKG: None  Radiology: No results found.   Medications Ordered in the ED  nystatin  cream (MYCOSTATIN ) ( Topical Given 05/05/24 1800)  lactated ringers  bolus 1,000 mL (0 mLs Intravenous Stopped 05/05/24 1726)  fluconazole  (DIFLUCAN ) tablet 150 mg (150 mg Oral Given 05/05/24 1758)  insulin  aspart (novoLOG ) injection 10 Units (10 Units Subcutaneous Given 05/05/24 1806)    Clinical Course as of 05/05/24 1943  Sat May 05, 2024  1553 Glucose(!!): 590 No elevated AG. 20 ketones in urine. BHA elevated to 1.94 but not consistent with DKA. Echo from 2023 with normal LVEF. Provided  IVF, insulin  for hyperglycemia. [LB]  1901 Specific Gravity, Urine(!): 1.033 UA concentrated with small Hgb, small leukocytes, WBC, rare bacteria. When she has had UTI with positive cultures in past, patient had nitrites in urine. I suspect that small amount of WBC, leukocytes are likely secondary to current candidal infection.UA does not appear grossly infected.  Does have symptoms of dysuria and blood with wiping however also likely secondary to polyuria, frequent wiping secondary to hyperglycemia.  Will obtain urine culture to ensure no UTI. [LB]  1937 Glucose-Capillary(!): 245 [LB]    Clinical Course User Index [LB] Minnie Tinnie BRAVO, PA                                 Medical Decision Making Amount and/or Complexity of Data Reviewed Labs: ordered. Decision-making details documented in ED Course.  Risk Prescription drug management.   Patient presents to the ED for concern of dysuria, blood with wiping, vaginal pain, this involves an extensive number of treatment options, and is a complaint that carries with it a high risk of complications and morbidity.  The differential diagnosis includes candidiasis, UTI, stone, pyelonephritis, traumatic injury   Co morbidities that complicate the patient evaluation  Insulin -dependent T2DM   Additional history obtained:  Additional history obtained from Nursing   External records from outside source obtained and reviewed including triage RN note, ED note from 04/14/2024   Lab Tests:  I Ordered, and personally interpreted labs.  The pertinent results include:   CBG originally 590 decreased to 245 UA with concentrated urine, small Hgb, 20 ketones, small leukocytes, 6-10 WBC, rare bacteria VBG no acidosis.  Bicarb 29.6 BHA 1.94 Sodium 132 AG 14 No leukocytosis    Medicines ordered and prescription drug management:  I ordered medication including fluconazole , nystatin   for fungal infection  Reevaluation of the patient after these  medicines showed that the  patient stayed the same I have reviewed the patients home medicines and have made adjustments as needed    Problem List / ED Course:  Hyperglycemia Initial sugar 590 no AMS AG WNL.  No metabolic acidosis on VBG.  20 ketones in urine.  BHA was mildly elevated at 1.94 however with no anion gap nor metabolic acidosis, do not think this is consistent with DKA at this time. Did provide LR which improved sugar to 455.  Provided a dose of subcu insulin  that decrease sugar to 245 Discussed importance of checking sugar at home, insulin  prescription dependence  Urinary frequency Hematuria Vital signs hemodynamically stable with no fever nor tachycardia.  No leukocytosis.  No signs of sepsis. Did not see any gross hematuria on my exam. No signs of vaginal or urethral trauma No CVA tenderness bilaterally.  No complaints of intermittent flank pain. no complaints of fever or chills at home.  Low suspicion for stone, pyelonephritis UA has small Hgb but otherwise appears noninfectious.  I do suspect that small leukocytes, WBC are related to current candidal infection.  Urinary frequency, polyuria is also likely secondary to hyperglycemia as her sugar initially was 590.  UTIs in the past have consisted of positive nitrates on her UAs.  No nitrates on today's UA.  However, I did send urine culture to ensure no infection  Vaginal pain Likely secondary to vulvovaginal candidiasis There is significant erythema, irritation to vaginal region, inguinal folds consistent with candidiasis  Candidiasis Does not appear consistent with gangrene.  There is no crepitus, ecchymosis, nor drainage on exam.  Symptoms have been occurring intermittently over the past 3 weeks and patient would likely have a leukocytosis, fever, systemic function, or worsening symptoms at this point Vaginal candidiasis and pattern of depends which she wears at baseline.  I discussed education regarding changing these  frequently as it can cause fungal infection, irritation.  I also discussed that uncontrolled CBG can predispose patient to fungal infections and it is important to control sugar at home.  She expressed understanding. Provided her a dose of fluconazole  here in Emergency Department as well as nystatin  cream for areas of fungal infection.   Has 3 refills for nystatin  that were placed on 01/13/2024. Informed her to pick it up and start using No signs of bacterial skin infection nor cellulitis.  No streaking, fluctuance on skin exam  was told by nursing that patient swears up and down that she did not take fluconazole  once as she dropped it on the ground and did not take it.  Did provide her with a dose of fluconazole  and ensured it was taken   Reevaluation:  After the interventions noted above, I reevaluated the patient and found that they have :improved    Dispostion:  After consideration of the diagnostic results and the patients response to treatment, I feel that the patent would benefit from outpatient management with PCP f/u.   Discussed ED workup, disposition, return to ED precautions with patient who expresses understanding agrees with plan.  All questions answered to their satisfaction.  They are agreeable to plan.  Discharge instructions provided on paperwork  Final diagnoses:  Vulvovaginal candidiasis  Candidiasis of breast  Type 2 diabetes mellitus with hyperglycemia, with long-term current use of insulin  [facility])    ED Discharge Orders     None        Minnie Tinnie BRAVO, PA 05/05/24 1947    Minnie Tinnie BRAVO, PA 05/05/24 2010    Dasie Faden, MD 05/06/24  1738 ° °

## 2024-05-05 NOTE — ED Provider Triage Note (Signed)
 Emergency Medicine Provider Triage Evaluation Note  Sarah Charles , a 63 y.o. female  was evaluated in triage.  Pt complains of dysuria, increased urinary frequency, generalized abdominal pain, vaginal pain, and pain when she wipes after urinating.  She states she was seen 2 weeks ago in our ED for similar symptoms.  She was given medication which improved symptoms for about 3 days before they returned and have continued to worsen.  She denies fevers, shortness of breath, chest pain, hematuria, or change in bowels.  She had 1 episode of nausea and vomiting 3 days ago.  Denies any current nausea.  She has low back pain at baseline; she states this back pain may be worse though it is not unilateral.  At ED visit on 04/14/2024 she was treated for vulvovaginal candidiasis and discharged with fluconazole  prescription.  Review of Systems  Positive: Dysuria, increased urinary frequency, generalized abdominal pain, vaginal pain Negative: Fevers, hematuria, chest pain, shortness of breath  Physical Exam  BP (!) 133/91 (BP Location: Right Arm)   Pulse 99   Temp 98 F (36.7 C) (Oral)   Resp 18   SpO2 93%  Gen:   Awake, no distress   Resp:  Normal effort  MSK:   Moves extremities without difficulty  Other:  Tenderness with palpation of entire abdomen without guarding or distention.  No CVA tenderness.  Medical Decision Making  Medically screening exam initiated at 1:33 PM.  Appropriate orders placed.  Sarah Charles was informed that the remainder of the evaluation will be completed by another provider, this initial triage assessment does not replace that evaluation, and the importance of remaining in the ED until their evaluation is complete.   Sarah Charles LABOR, PA-C 05/05/24 1338

## 2024-05-05 NOTE — ED Triage Notes (Signed)
 Pt here for groin pain related to possible infection x 1 week that she has been trying manage her self.

## 2024-05-05 NOTE — Discharge Instructions (Addendum)
 Thank you for letting us  evaluate you today.  You appear to have a fungal infection of your skin.  I have given you a dose of insulin  here in emergency.  Check your sugars at home for going to bed.  You do not need to take any additional insulin  tonight and need to take your insulin  tomorrow morning.  I have also provided you with antifungal pill here Emergency Department for fungal infection.  You have nystatin  cream that is available to be picked up at [facility] pharmacy.  Please get up and use for skin infection

## 2024-05-07 LAB — URINE CULTURE: Culture: >=100000 — AB

## 2024-05-08 ENCOUNTER — Telehealth (HOSPITAL_BASED_OUTPATIENT_CLINIC_OR_DEPARTMENT_OTHER): Payer: Self-pay | Admitting: *Deleted

## 2024-05-08 NOTE — Telephone Encounter (Signed)
 Post ED Visit - Positive Culture Follow-up: Unsuccessful Patient Follow-up  Culture assessed and recommendations reviewed by:  [x]  Rankin Sams, Pharm.D. []  Venetia Gully, Pharm.D., BCPS AQ-ID []  Garrel Crews, Pharm.D., BCPS []  Almarie Lunger, Pharm.D., BCPS []  [place], [address].D., BCPS, AAHIVP []  Rosaline Bihari, Pharm.D., BCPS, AAHIVP []  Massie Rigg, PharmD []  Jodie Rower, PharmD, BCPS  Positive urine culture  [x]  Patient discharged without antimicrobial prescription and treatment is now indicated []  Organism is resistant to prescribed ED discharge antimicrobial []  Patient with positive blood cultures  Plan: Cephalexin  500 mg BID x 5 days  Unable to contact patient after 3 attempts, letter will be sent to address on file  Lorita Barnie Pereyra 05/08/2024, 2:55 PM

## 2024-05-08 NOTE — Progress Notes (Signed)
 ED Antimicrobial Stewardship Positive Culture Follow Up   Sarah Charles is an 63 y.o. female who presented to [facility] with a chief complaint of vaginal pain and increased urinary frequency.  Chief Complaint  Patient presents with   Vaginal Pain    Recent Results (from the past 720 hours)  Urine Culture     Status: Abnormal   Collection Time: 05/05/24  3:47 PM   Specimen: Urine, Clean Catch  Result Value Ref Range Status   Specimen Description URINE, CLEAN CATCH  Final   Special Requests   Final    NONE Performed at [facility], [address] [place], [place] [address]    Culture >=100,000 COLONIES/mL ESCHERICHIA COLI (A)  Final   Report Status 05/07/2024 FINAL  Final   Organism ID, Bacteria ESCHERICHIA COLI (A)  Final      Susceptibility   Escherichia coli - MIC*    AMPICILLIN >=32 RESISTANT Resistant     CEFAZOLIN (URINE) Value in next row Sensitive      4 SENSITIVEThis is a modified FDA-approved test that has been validated and its performance characteristics determined by the reporting laboratory.  This laboratory is certified under the Clinical Laboratory Improvement Amendments CLIA as qualified to perform high complexity clinical laboratory testing.    CEFEPIME Value in next row Sensitive      4 SENSITIVEThis is a modified FDA-approved test that has been validated and its performance characteristics determined by the reporting laboratory.  This laboratory is certified under the Clinical Laboratory Improvement Amendments CLIA as qualified to perform high complexity clinical laboratory testing.    ERTAPENEM Value in next row Sensitive      4 SENSITIVEThis is a modified FDA-approved test that has been validated and its performance characteristics determined by the reporting laboratory.  This laboratory is certified under the Clinical Laboratory Improvement Amendments CLIA as qualified to perform high complexity clinical laboratory testing.    CEFTRIAXONE  Value in next  row Sensitive      4 SENSITIVEThis is a modified FDA-approved test that has been validated and its performance characteristics determined by the reporting laboratory.  This laboratory is certified under the Clinical Laboratory Improvement Amendments CLIA as qualified to perform high complexity clinical laboratory testing.    CIPROFLOXACIN  Value in next row Sensitive      4 SENSITIVEThis is a modified FDA-approved test that has been validated and its performance characteristics determined by the reporting laboratory.  This laboratory is certified under the Clinical Laboratory Improvement Amendments CLIA as qualified to perform high complexity clinical laboratory testing.    GENTAMICIN Value in next row Resistant      4 SENSITIVEThis is a modified FDA-approved test that has been validated and its performance characteristics determined by the reporting laboratory.  This laboratory is certified under the Clinical Laboratory Improvement Amendments CLIA as qualified to perform high complexity clinical laboratory testing.    NITROFURANTOIN  Value in next row Sensitive      4 SENSITIVEThis is a modified FDA-approved test that has been validated and its performance characteristics determined by the reporting laboratory.  This laboratory is certified under the Clinical Laboratory Improvement Amendments CLIA as qualified to perform high complexity clinical laboratory testing.    TRIMETH /SULFA  Value in next row Resistant      4 SENSITIVEThis is a modified FDA-approved test that has been validated and its performance characteristics determined by the reporting laboratory.  This laboratory is certified under the Clinical Laboratory Improvement Amendments CLIA as qualified  to perform high complexity clinical laboratory testing.    AMPICILLIN/SULBACTAM Value in next row Resistant      4 SENSITIVEThis is a modified FDA-approved test that has been validated and its performance characteristics determined by the reporting  laboratory.  This laboratory is certified under the Clinical Laboratory Improvement Amendments CLIA as qualified to perform high complexity clinical laboratory testing.    PIP/TAZO Value in next row Sensitive      <=4 SENSITIVEThis is a modified FDA-approved test that has been validated and its performance characteristics determined by the reporting laboratory.  This laboratory is certified under the Clinical Laboratory Improvement Amendments CLIA as qualified to perform high complexity clinical laboratory testing.    MEROPENEM Value in next row Sensitive      <=4 SENSITIVEThis is a modified FDA-approved test that has been validated and its performance characteristics determined by the reporting laboratory.  This laboratory is certified under the Clinical Laboratory Improvement Amendments CLIA as qualified to perform high complexity clinical laboratory testing.    * >=100,000 COLONIES/mL ESCHERICHIA COLI    Patient discharged originally without antimicrobial agent and treatment is now indicated  New antibiotic prescription: Cephalexin  500 mg BID x 5 days   ED Provider: Sarah Smoot, PA    Rankin Sams 05/08/2024, 9:28 AM Clinical Pharmacist Monday - Friday phone -  [phone] Saturday - Sunday phone - [phone]

## 2024-05-09 ENCOUNTER — Telehealth: Payer: Self-pay

## 2024-05-09 NOTE — Patient Instructions (Signed)
 [name] - I am sorry I was unable to reach you today for our scheduled appointment. I work with Newlin, Enobong, MD and am calling to support your healthcare needs. Please contact me at [phone] at your earliest convenience. I look forward to speaking with you soon.   Thank you,  Santana Stamp BSN, CCM [place]  [facility] RN Care Manager Direct Dial: [phone]  Fax: [phone]

## 2024-05-18 ENCOUNTER — Telehealth: Payer: Self-pay

## 2024-05-18 NOTE — Patient Instructions (Signed)
 Sarah Charles - I am sorry I was unable to reach you today for our scheduled appointment. I work with Newlin, Enobong, MD and am calling to support your healthcare needs. Please contact me at [phone] at your earliest convenience. I look forward to speaking with you soon.   Thank you,  Santana Stamp BSN, CCM [place]  [facility] Population [facility] RN Care Manager Direct Dial: [phone]  Fax: [phone]'

## 2024-05-22 ENCOUNTER — Other Ambulatory Visit: Payer: Self-pay

## 2024-05-22 ENCOUNTER — Emergency Department (HOSPITAL_COMMUNITY)
Admission: EM | Admit: 2024-05-22 | Discharge: 2024-05-22 | Disposition: A | Discharge status: Home / Self Care | Attending: Emergency Medicine | Admitting: Emergency Medicine

## 2024-05-22 DIAGNOSIS — Z7982 Long term (current) use of aspirin: Secondary | ICD-10-CM | POA: Diagnosis not present

## 2024-05-22 DIAGNOSIS — E1165 Type 2 diabetes mellitus with hyperglycemia: Secondary | ICD-10-CM | POA: Insufficient documentation

## 2024-05-22 DIAGNOSIS — R300 Dysuria: Secondary | ICD-10-CM | POA: Diagnosis present

## 2024-05-22 DIAGNOSIS — Z7984 Long term (current) use of oral hypoglycemic drugs: Secondary | ICD-10-CM | POA: Insufficient documentation

## 2024-05-22 DIAGNOSIS — Z794 Long term (current) use of insulin: Secondary | ICD-10-CM | POA: Insufficient documentation

## 2024-05-22 DIAGNOSIS — I10 Essential (primary) hypertension: Secondary | ICD-10-CM | POA: Insufficient documentation

## 2024-05-22 DIAGNOSIS — R739 Hyperglycemia, unspecified: Secondary | ICD-10-CM

## 2024-05-22 DIAGNOSIS — N3001 Acute cystitis with hematuria: Secondary | ICD-10-CM | POA: Diagnosis not present

## 2024-05-22 LAB — COMPREHENSIVE METABOLIC PANEL WITH GFR
ALT: 9 U/L (ref 0–44)
AST: 15 U/L (ref 15–41)
Albumin: 3.9 g/dL (ref 3.5–5.0)
Alkaline Phosphatase: 167 U/L — ABNORMAL HIGH (ref 38–126)
Anion gap: 13 (ref 5–15)
BUN: 14 mg/dL (ref 8–23)
CO2: 25 mmol/L (ref 22–32)
Calcium: 10.1 mg/dL (ref 8.9–10.3)
Chloride: 94 mmol/L — ABNORMAL LOW (ref 98–111)
Creatinine, Ser: 0.78 mg/dL (ref 0.44–1.00)
GFR, Estimated: >60 mL/min
Glucose, Bld: 538 mg/dL (ref 70–99)
Potassium: 4.4 mmol/L (ref 3.5–5.1)
Sodium: 133 mmol/L — ABNORMAL LOW (ref 135–145)
Total Bilirubin: 0.4 mg/dL (ref 0.0–1.2)
Total Protein: 7 g/dL (ref 6.5–8.1)

## 2024-05-22 LAB — CBC WITH DIFFERENTIAL/PLATELET
Abs Immature Granulocytes: 0.03 K/uL (ref 0.00–0.07)
Basophils Absolute: 0.1 K/uL (ref 0.0–0.1)
Basophils Relative: 1 %
Eosinophils Absolute: 0.2 K/uL (ref 0.0–0.5)
Eosinophils Relative: 2 %
HCT: 42.9 % (ref 36.0–46.0)
Hemoglobin: 15.2 g/dL — ABNORMAL HIGH (ref 12.0–15.0)
Immature Granulocytes: 0 %
Lymphocytes Relative: 28 %
Lymphs Abs: 2.4 K/uL (ref 0.7–4.0)
MCH: 31.1 pg (ref 26.0–34.0)
MCHC: 35.4 g/dL (ref 30.0–36.0)
MCV: 87.7 fL (ref 80.0–100.0)
Monocytes Absolute: 0.7 K/uL (ref 0.1–1.0)
Monocytes Relative: 8 %
Neutro Abs: 5.2 K/uL (ref 1.7–7.7)
Neutrophils Relative %: 61 %
Platelets: 281 K/uL (ref 150–400)
RBC: 4.89 MIL/uL (ref 3.87–5.11)
RDW: 12.8 % (ref 11.5–15.5)
WBC: 8.5 K/uL (ref 4.0–10.5)
nRBC: 0 % (ref 0.0–0.2)

## 2024-05-22 LAB — CBG MONITORING, ED: Glucose-Capillary: 518 mg/dL (ref 70–99)

## 2024-05-22 LAB — URINALYSIS, W/ REFLEX TO CULTURE (INFECTION SUSPECTED)
Bilirubin Urine: NEGATIVE
Glucose, UA: >=500 mg/dL — AB
Ketones, ur: 5 mg/dL — AB
Nitrite: POSITIVE — AB
Protein, ur: NEGATIVE mg/dL
Specific Gravity, Urine: 1.031 — ABNORMAL HIGH (ref 1.005–1.030)
pH: 5 (ref 5.0–8.0)

## 2024-05-22 LAB — I-STAT CG4 LACTIC ACID, ED: Lactic Acid, Venous: 1.7 mmol/L (ref 0.5–1.9)

## 2024-05-22 MED ORDER — CEPHALEXIN 250 MG PO CAPS
500.0000 mg | ORAL_CAPSULE | Freq: Once | ORAL | Status: AC
  Administered 2024-05-22: 500 mg via ORAL
  Filled 2024-05-22: qty 2

## 2024-05-22 MED ORDER — PHENAZOPYRIDINE HCL 200 MG PO TABS: 200.0000 mg | ORAL_TABLET | Freq: Three times a day (TID) | ORAL | 0 refills | Status: AC

## 2024-05-22 MED ORDER — CEPHALEXIN 500 MG PO CAPS: 500.0000 mg | ORAL_CAPSULE | Freq: Two times a day (BID) | ORAL | 0 refills | Status: AC

## 2024-05-22 NOTE — Discharge Instructions (Addendum)
 Your urine has a bacterial infection and you were started on an antibiotic.  Make sure you are drinking plenty of water and taking your diabetes medication.  Try to avoid sugars.  Also make sure you are taking your blood pressure medication.  Return to the emergency room if you start running high fever, vomiting or have severe pain in your abdomen.

## 2024-05-22 NOTE — ED Triage Notes (Signed)
 Pt. Stated, I've had urinary symptoms for 3 weeks ago and given an antibiotic and not helped. Im having frequent urination and it burns when I pee.

## 2024-05-22 NOTE — ED Provider Notes (Signed)
 " [facility] Provider Note   CSN: [id number] Arrival date & time: 05/22/24  9272     Patient presents with: Dysuria and urine frequency   Sarah Charles is a 63 y.o. female.   Patient is a 63 year old female with a history of diabetes, hypertension, hyperlipidemia who presents with a 3-week history of ongoing dysuria, frequency and urgency.  She reports that she has been seen twice now and diagnosed with a yeast infection which she did take the medication but her symptoms are not improving.  She also reports her blood sugar has been higher than normal despite taking her Lantus .  She has had no nausea vomiting or abdominal pain.  She does report having to urinate every 10 minutes.  The history is provided by the patient and medical records.  Dysuria      Prior to Admission medications  Medication Sig Start Date End Date Taking? Authorizing Provider  cephALEXin  (KEFLEX ) 500 MG capsule Take 1 capsule (500 mg total) by mouth 2 (two) times daily. 05/22/24  Yes Doretha Folks, MD  phenazopyridine  (PYRIDIUM ) 200 MG tablet Take 1 tablet (200 mg total) by mouth 3 (three) times daily. 05/22/24  Yes Doretha Folks, MD  Accu-Chek Softclix Lancets lancets Use to check blood sugar 3 times daily. E11.65 01/18/24   Delbert Clam, MD  aspirin  EC 81 MG tablet Take 1 tablet (81 mg total) by mouth daily. Swallow whole. 08/25/23   Newlin, Enobong, MD  Blood Glucose Monitoring Suppl (ACCU-CHEK GUIDE) w/Device KIT Use to check blood sugar 3 times daily. E11.65 01/18/24   Newlin, Enobong, MD  buPROPion  (WELLBUTRIN  SR) 150 MG 12 hr tablet Take 1 tablet (150 mg total) by mouth 2 (two) times daily. 01/13/24   Fleming, Zelda W, NP  carvedilol  (COREG ) 3.125 MG tablet Take 1 tablet (3.125 mg total) by mouth 2 (two) times daily with a meal. 01/13/24   Theotis Haze ORN, NP  fenofibrate  (TRICOR ) 145 MG tablet Take 1 tablet (145 mg total) by mouth every evening. 01/13/24    Theotis Haze ORN, NP  FISH OIL-CHOLECALCIFEROL PO Take by mouth.    [provider]  fluconazole  (DIFLUCAN ) 150 MG tablet Take 1 tablet (150 mg total) by mouth  once daily.as directed. If your symptoms do not improve by 04-16-24 please take the second dose of Diflucan  by mouth.  If your symptoms have still not resolved by 04-19-24 please pick up the third dose of Diflucan . 04/14/24   Ula Prentice SAUNDERS, MD  FLUoxetine  (PROZAC ) 20 MG capsule Take 1 capsule (20 mg total) by mouth every morning. 01/13/24   Fleming, Zelda W, NP  furosemide  (LASIX ) 20 MG tablet Take 1 tablet (20 mg total) by mouth daily. 01/13/24   Fleming, Zelda W, NP  gabapentin  (NEURONTIN ) 300 MG capsule Take 2 capsules (600 mg total) by mouth 3 (three) times daily. 01/13/24   Fleming, Zelda W, NP  glucose blood (ACCU-CHEK GUIDE TEST) test strip Use to check blood sugar 3 times daily. E11.65 01/18/24   Newlin, Enobong, MD  hydrOXYzine  (ATARAX ) 25 MG tablet Take 1 tablet (25 mg total) by mouth 3 (three) times daily as needed. For anxiety 12/15/23   Newlin, Enobong, MD  insulin  glargine (LANTUS  SOLOSTAR) 100 UNIT/ML Solostar Pen Inject 33 Units into the skin 2 (two) times daily. 01/13/24   Theotis Haze ORN, NP  Insulin  Pen Needle (COMFORT EZ PEN NEEDLES) 31G X 5 MM MISC Inject 1 each into the skin in  the morning and at bedtime. use as directed 01/13/24   Fleming, Zelda W, NP  Lancets Misc. (ACCU-CHEK FASTCLIX LANCET) KIT USE AS DIRECTED 11/12/21   Newlin, Enobong, MD  lisinopril  (ZESTRIL ) 40 MG tablet Take 1 tablet (40 mg total) by mouth daily. 01/13/24   Fleming, Zelda W, NP  metFORMIN  (GLUCOPHAGE ) 500 MG tablet Take 2 tablets (1,000 mg total) by mouth 2 (two) times daily. 01/13/24   Fleming, Zelda W, NP  methocarbamol  (ROBAXIN ) 750 MG tablet Take 1 tablet (750 mg total) by mouth every 8 (eight) hours as needed for muscle spasms. 01/13/24   Fleming, Zelda W, NP  metoCLOPramide  (REGLAN ) 10 MG tablet Take 1 tablet (10 mg total) by mouth every 8  (eight) hours as needed for nausea and vomiting 01/13/24   Theotis Haze ORN, NP  nitroGLYCERIN  (NITROSTAT ) 0.4 MG SL tablet Place 0.4 mg under the tongue every 5 (five) minutes as needed for chest pain.    [provider]  nystatin  cream (MYCOSTATIN ) Apply to affected area 2 times daily 01/13/24   Fleming, Zelda W, NP  nystatin  powder Apply 1 Application topically 3 (three) times daily. 01/13/24   Fleming, Zelda W, NP  oxyCODONE  (OXY IR/ROXICODONE ) 5 MG immediate release tablet Take 5 mg by mouth every 4 (four) hours as needed for moderate pain. 11/29/22   [provider]  pantoprazole  (PROTONIX ) 40 MG tablet Take 1 tablet (40 mg total) by mouth 2 (two) times daily. 01/13/24   Fleming, Zelda W, NP  rosuvastatin  (CRESTOR ) 40 MG tablet Take 1 tablet (40 mg total) by mouth every evening. 01/13/24   Fleming, Zelda W, NP  sucralfate  (CARAFATE ) 1 g tablet Take 1 g by mouth 4 (four) times daily -  with meals and at bedtime.    [provider]  traZODone  (DESYREL ) 50 MG tablet Take 1 tablet (50 mg total) by mouth at bedtime. 01/13/24   Theotis Haze ORN, NP    Allergies: Cymbalta [duloxetine hcl]    Review of Systems  Genitourinary:  Positive for dysuria.    Updated Vital Signs BP (!) 180/103 (BP Location: Right Arm)   Pulse 88   Temp (!) 96.4 F (35.8 C) (Oral)   Resp (!) 26   SpO2 99%   Physical Exam Vitals and nursing note reviewed.  Constitutional:      General: She is not in acute distress.    Appearance: She is well-developed.  HENT:     Head: Normocephalic and atraumatic.  Eyes:     Conjunctiva/sclera: Conjunctivae normal.     Pupils: Pupils are equal, round, and reactive to light.  Cardiovascular:     Rate and Rhythm: Normal rate and regular rhythm.     Pulses: Normal pulses.     Heart sounds: No murmur heard. Pulmonary:     Effort: Pulmonary effort is normal. No respiratory distress.     Breath sounds: Normal breath sounds. No wheezing or rales.   Abdominal:     General: There is no distension.     Palpations: Abdomen is soft.     Tenderness: There is no abdominal tenderness. There is no right CVA tenderness, left CVA tenderness, guarding or rebound.  Musculoskeletal:        General: No tenderness. Normal range of motion.     Cervical back: Normal range of motion and neck supple.     Right lower leg: No edema.     Left lower leg: No edema.  Skin:    General: Skin  is warm and dry.     Findings: No erythema or rash.  Neurological:     Mental Status: She is alert and oriented to person, place, and time.  Psychiatric:        Behavior: Behavior normal.     (all labs ordered are listed, but only abnormal results are displayed) Labs Reviewed  COMPREHENSIVE METABOLIC PANEL WITH GFR - Abnormal; Notable for the following components:      Result Value   Sodium 133 (*)    Chloride 94 (*)    Glucose, Bld 538 (*)    Alkaline Phosphatase 167 (*)    All other components within normal limits  CBC WITH DIFFERENTIAL/PLATELET - Abnormal; Notable for the following components:   Hemoglobin 15.2 (*)    All other components within normal limits  URINALYSIS, W/ REFLEX TO CULTURE (INFECTION SUSPECTED) - Abnormal; Notable for the following components:   APPearance HAZY (*)    Specific Gravity, Urine 1.031 (*)    Glucose, UA >=500 (*)    Hgb urine dipstick SMALL (*)    Ketones, ur 5 (*)    Nitrite POSITIVE (*)    Leukocytes,Ua SMALL (*)    Bacteria, UA MANY (*)    All other components within normal limits  CBG MONITORING, ED - Abnormal; Notable for the following components:   Glucose-Capillary 518 (*)    All other components within normal limits  I-STAT CG4 LACTIC ACID, ED  I-STAT CG4 LACTIC ACID, ED    EKG: None  Radiology: No results found.   Procedures   Medications Ordered in the ED  cephALEXin  (KEFLEX ) capsule 500 mg (500 mg Oral Given 05/22/24 0945)                                    Medical Decision Making Amount  and/or Complexity of Data Reviewed Labs: ordered. Decision-making details documented in ED Course.  Risk Prescription drug management.   Pt with multiple medical problems and comorbidities and presenting today with a complaint that caries a high risk for morbidity and mortality.  Here today with complaints of dysuria without systemic symptoms.  Patient is well-appearing on exam.  She is hypertensive but has normal pulse.  Based on records patient had a urine culture that came back positive for E. coli and they attempted to contact her 3 separate times to prescribe antibiotics but were unable to get a hold of her.  Patient reports her grandchild threw her phone in a [place] and it has not been working.  I independently interpreted patient's labs and UA is concerning for ongoing infection, lactic acid is within normal limits, CMP does show a blood sugar of 538 with a normal creatinine and normal anion gap of 13, CBC is within normal limits.  Patient is tolerating p.o.'s and was given antibiotics here.  No evidence of DKA at this time.  Did give patient return precautions but at this time we will treat with antibiotics and appears stable for discharge.      Final diagnoses:  Acute cystitis with hematuria  Hyperglycemia    ED Discharge Orders          Ordered    cephALEXin  (KEFLEX ) 500 MG capsule  2 times daily        05/22/24 0940    phenazopyridine  (PYRIDIUM ) 200 MG tablet  3 times daily        05/22/24 0940  Doretha Folks, MD 05/22/24 1022  "

## 2024-05-25 ENCOUNTER — Ambulatory Visit: Payer: Self-pay

## 2024-05-25 NOTE — Telephone Encounter (Signed)
" °  FYI Only or Action Required?: FYI only for provider: pt to go to urgent care.  Patient was last seen in primary care on 03/28/2024 by Oley Bascom RAMAN, NP.  Called Nurse Triage reporting Urinary Tract Infection.  Symptoms began several weeks ago.  Interventions attempted: Prescription medications: keflex , pyridium .  Symptoms are: unchanged.  Triage Disposition: See Physician Within 24 Hours  Patient/caregiver understands and will follow disposition?: Yes  Copied from CRM [id number]. Topic: Clinical - Red Word Triage >> May 25, 2024 12:00 PM Delon HERO wrote: Red Word that prompted transfer to Nurse Triage: Patient is calling to report severe vaginal itching and pain since thanksgiving. Given medication in the hospital. but not helping. With frequent urination. Reason for Disposition  [1] Taking antibiotic > 72 hours (3 days) for UTI AND [2] painful urination or frequency is SAME (unchanged, not better)  Answer Assessment - Initial Assessment Questions 1. MAIN SYMPTOM: What is the main symptom you are concerned about? (e.g., painful urination, urine frequency)     Urinary frequency (every ten minutes per patient) and severe itching (I just want to put a stick up there until it bleeds, you know? I want to never ever ever feel this way again. Per patient).   2. BETTER-SAME-WORSE: Are you getting better, staying the same, or getting worse compared to how you felt at your last visit to the doctor (most recent medical visit)?     Same since she began keflex  on 12/30.   3. PAIN: How bad is the pain?  (e.g., Scale 1-10; mild, moderate, or severe)     9/10 burning with urination and severe itching  4. FEVER: Do you have a fever? If Yes, ask: What is it, how was it measured, and when did it start?     Denies  5. OTHER SYMPTOMS: Do you have any other symptoms? (e.g., blood in the urine, flank pain, vaginal discharge)     Denies  6. DIAGNOSIS: When was the UTI diagnosed? By  whom? Was it a kidney infection, bladder infection or both?     12/30, although the pt reports urinary symptoms since Thanksgiving.   7. ANTIBIOTIC: What antibiotic(s) are you taking? How many times per day?     Keflex    8. ANTIBIOTIC - START DATE: When did you start taking the antibiotic?     12/30  Protocols used: Urinary Tract Infection on Antibiotic Follow-up Call - [facility]  "

## 2024-05-29 ENCOUNTER — Other Ambulatory Visit (HOSPITAL_COMMUNITY): Payer: Self-pay

## 2024-05-29 ENCOUNTER — Ambulatory Visit: Payer: Self-pay | Admitting: Family Medicine

## 2024-05-29 NOTE — Telephone Encounter (Signed)
 FYI Only or Action Required?: Action required by provider: request for appointment, medication refill request, and update on patient condition.  Patient was last seen in primary care on 03/28/2024 by Oley Bascom RAMAN, NP.  Called Nurse Triage reporting Groin Swelling.  Symptoms began several months ago.  Interventions attempted: Prescription medications: pyridium ; keflex .  Symptoms are: gradually worsening.  Triage Disposition: See PCP Within 2 Weeks  Patient/caregiver understands and will follow disposition?: No, wishes to speak with PCP    Copied from CRM [id number]. Topic: Clinical - Red Word Triage >> May 29, 2024  2:28 PM Tinnie BROCKS wrote: Red Word that prompted transfer to Nurse Triage: Severe burning/pain around labia, burns when she urinates. Previously triaged for possible UTI, went to hospital and no UTI found. Wants urgent visit with [facility].    Reason for Disposition  All other vaginal symptoms  (Exceptions: Feels like prior yeast infection, minor abrasion, mild rash < 24 hour duration, mild itching, vaginal dryness during sex.)  Answer Assessment - Initial Assessment Questions Pt called in requesting appt with PCP for swelling and burning around labia after urination. Pt has been to ED, 12/30 and dx with acute cystitis with hematuria, completed keflex  and pyridium  without relief. Pt reports symptoms have been present since Thanksgiving. Pt has also been to UC and urinalysis was negative for UTI so she was sent home. Pt states she does have vaginal dryness and swelling, denies fever, no discharge, no abdominal or pelvic pain. Offered appt today at sister office but pt declined due to transport. No appt at clinic until 01/16. Pt requesting sooner appt. Pt also requested refill of gabapentin  for neuropathy; tingling in legs.     1. SYMPTOM: What's the main symptom you're concerned about? (e.g., pain, itching, dryness)     Burning, pain around labia   2. LOCATION: Where is the   burning located? (e.g., inside/outside, left/right)     Inside labia, worsens after urination   3. ONSET: When did the  symptoms  start?     Around thanksgiving   4. PAIN: Is there any pain? If Yes, ask: How bad is it? (Scale: 1-10; mild, moderate, severe)     Yes, severe   5. ITCHING: Is there any itching? If Yes, ask: How bad is it? (Scale: 1-10; mild, moderate, severe)     No   6. CAUSE: What do you think is causing the discharge? Have you had the same problem before? What happened then?     Unsure; pt states she has been tested for UTIs and prescribed abx without relief   7. OTHER SYMPTOMS: Do you have any other symptoms? (e.g., fever, itching, vaginal bleeding, pain with urination, injury to genital area, vaginal foreign body)     Dryness, swelling  Protocols used: Vaginal Symptoms-A-AH

## 2024-05-29 NOTE — Telephone Encounter (Signed)
 Patient has been called and scheduled an appointment with PCP.

## 2024-05-30 ENCOUNTER — Other Ambulatory Visit (HOSPITAL_COMMUNITY): Payer: Self-pay

## 2024-05-30 ENCOUNTER — Other Ambulatory Visit: Payer: Self-pay

## 2024-06-01 ENCOUNTER — Other Ambulatory Visit (HOSPITAL_COMMUNITY): Payer: Self-pay

## 2024-06-04 ENCOUNTER — Other Ambulatory Visit (HOSPITAL_COMMUNITY): Payer: Self-pay

## 2024-06-04 ENCOUNTER — Encounter: Payer: Self-pay | Admitting: Family Medicine

## 2024-06-04 ENCOUNTER — Ambulatory Visit: Payer: Self-pay | Attending: Family Medicine | Admitting: Family Medicine

## 2024-06-04 ENCOUNTER — Other Ambulatory Visit: Payer: Self-pay

## 2024-06-04 VITALS — BP 137/76 | HR 75 | Temp 97.8°F | Ht 63.0 in | Wt 164.2 lb

## 2024-06-04 DIAGNOSIS — B372 Candidiasis of skin and nail: Secondary | ICD-10-CM | POA: Diagnosis not present

## 2024-06-04 DIAGNOSIS — I152 Hypertension secondary to endocrine disorders: Secondary | ICD-10-CM

## 2024-06-04 DIAGNOSIS — E1165 Type 2 diabetes mellitus with hyperglycemia: Secondary | ICD-10-CM

## 2024-06-04 DIAGNOSIS — E1159 Type 2 diabetes mellitus with other circulatory complications: Secondary | ICD-10-CM | POA: Diagnosis not present

## 2024-06-04 DIAGNOSIS — R5383 Other fatigue: Secondary | ICD-10-CM | POA: Diagnosis not present

## 2024-06-04 DIAGNOSIS — B3731 Acute candidiasis of vulva and vagina: Secondary | ICD-10-CM | POA: Diagnosis not present

## 2024-06-04 DIAGNOSIS — Z7984 Long term (current) use of oral hypoglycemic drugs: Secondary | ICD-10-CM | POA: Diagnosis not present

## 2024-06-04 DIAGNOSIS — E785 Hyperlipidemia, unspecified: Secondary | ICD-10-CM | POA: Diagnosis not present

## 2024-06-04 DIAGNOSIS — E119 Type 2 diabetes mellitus without complications: Secondary | ICD-10-CM | POA: Diagnosis not present

## 2024-06-04 DIAGNOSIS — E1169 Type 2 diabetes mellitus with other specified complication: Secondary | ICD-10-CM | POA: Diagnosis not present

## 2024-06-04 DIAGNOSIS — Z794 Long term (current) use of insulin: Secondary | ICD-10-CM | POA: Diagnosis not present

## 2024-06-04 DIAGNOSIS — I2510 Atherosclerotic heart disease of native coronary artery without angina pectoris: Secondary | ICD-10-CM | POA: Diagnosis not present

## 2024-06-04 LAB — POCT URINALYSIS DIP (CLINITEK)
Bilirubin, UA: NEGATIVE
Glucose, UA: 500 mg/dL — AB
Leukocytes, UA: NEGATIVE
Nitrite, UA: NEGATIVE
POC PROTEIN,UA: NEGATIVE
Spec Grav, UA: 1.015
Urobilinogen, UA: 0.2 U/dL
pH, UA: 6

## 2024-06-04 LAB — POCT GLYCOSYLATED HEMOGLOBIN (HGB A1C): HbA1c, POC (controlled diabetic range): 14.2 % — AB (ref 0.0–7.0)

## 2024-06-04 MED ORDER — ACCU-CHEK GUIDE TEST VI STRP
ORAL_STRIP | 6 refills | Status: AC
  Filled 2024-06-04 (×2): qty 100, 33d supply, fill #0

## 2024-06-04 MED ORDER — ACCU-CHEK SOFTCLIX LANCETS MISC
6 refills | Status: AC
  Filled 2024-06-04 (×2): qty 100, 33d supply, fill #0

## 2024-06-04 MED ORDER — ACCU-CHEK GUIDE ME W/DEVICE KIT
PACK | 0 refills | Status: AC
  Filled 2024-06-04: qty 1, 1d supply, fill #0
  Filled 2024-06-04: qty 1, 30d supply, fill #0

## 2024-06-04 MED ORDER — FLUCONAZOLE 150 MG PO TABS
150.0000 mg | ORAL_TABLET | Freq: Every day | ORAL | 0 refills | Status: AC
  Filled 2024-06-04 – 2024-06-05 (×3): qty 7, 7d supply, fill #0

## 2024-06-04 MED ORDER — NYSTATIN 100000 UNIT/GM EX POWD
1.0000 | Freq: Three times a day (TID) | CUTANEOUS | 1 refills | Status: AC
  Filled 2024-06-04: qty 60, 30d supply, fill #0
  Filled 2024-06-04: qty 60, 20d supply, fill #0

## 2024-06-04 MED ORDER — LANTUS SOLOSTAR 100 UNIT/ML ~~LOC~~ SOPN
40.0000 [IU] | PEN_INJECTOR | Freq: Two times a day (BID) | SUBCUTANEOUS | 6 refills | Status: AC
  Filled 2024-06-04 (×2): qty 45, 56d supply, fill #0

## 2024-06-04 NOTE — Progress Notes (Signed)
 "  Subjective:  Patient ID: Sarah Charles, female    DOB: Nov 28, 1960  Age: 64 y.o. MRN: [id number]  CC: Medical Management of Chronic Issues (Vaginal burning and rawness/Frequent urination)     Discussed the use of AI scribe software for clinical note transcription with the patient, who gave verbal consent to proceed.  History of Present Illness Sarah Charles is a 64 year old female with a history of ype 2 diabetes mellitus , hypertension, hyperlipidemia, GERD, CAD (s/p DES)  who presents with recurrent vaginal burning, rawness and urinary symptoms.  She has felt unwell since late November and was seen in the emergency room in December, where she was diagnosed with vulvovaginal candidiasis on December 13 and a urinary tract infection on December 30. She currently has external genital burning described as rawness, frequent urination, and discomfort when sitting. She also has urinary incontinence and uses adult diapers, which she states she changes frequently.  Her diabetes is poorly controlled, with an A1c of 14.2 and a blood sugar of 538 at the emergency room visit. She uses Lantus  35 units twice daily and metformin  but sometimes takes doses late. She has not picked up her prescribed yeast medication since November and does not have a meter to check her blood sugar at home. She reports fatigue and low energy.   She did not take her blood pressure medication today because she has not eaten breakfast.  From a cardiac standpoint she denies plaints of chest pain or dyspnea.  She no longer drives and has given up her license.    Past Medical History:  Diagnosis Date   Arthritis    Coronary artery calcification seen on CT scan    a. 07/2017 noted on CT abd.   Depression    GERD (gastroesophageal reflux disease)    Hyperlipidemia    Hypertension    Obesity    Sleep apnea    a. Does not use CPAP cause i dont think I need it anymore.   Tobacco abuse    Type II diabetes mellitus [facility])      Past Surgical History:  Procedure Laterality Date   BIOPSY  12/21/2022   Procedure: BIOPSY;  Surgeon: Saintclair Jasper, MD;  Location: [facility];  Service: Gastroenterology;;   CHOLECYSTECTOMY     CORONARY PRESSURE/FFR STUDY N/A 11/04/2017   Procedure: INTRAVASCULAR PRESSURE WIRE/FFR STUDY;  Surgeon: Jordan, Peter M, MD;  Location: [facility] INVASIVE CV LAB;  Service: Cardiovascular;  Laterality: N/A;   CORONARY STENT INTERVENTION N/A 11/04/2017   Procedure: CORONARY STENT INTERVENTION;  Surgeon: Jordan, Peter M, MD;  Location: [facility] INVASIVE CV LAB;  Service: Cardiovascular;  Laterality: N/A;   ESOPHAGOGASTRODUODENOSCOPY (EGD) WITH PROPOFOL  N/A 12/21/2022   Procedure: ESOPHAGOGASTRODUODENOSCOPY (EGD) WITH PROPOFOL ;  Surgeon: Saintclair Jasper, MD;  Location: [facility];  Service: Gastroenterology;  Laterality: N/A;   INCISION AND DRAINAGE ABSCESS Left 12/22/2012   Procedure: INCISION AND DRAINAGE ABSCESS;  Surgeon: Oneil DELENA Budge, MD;  Location: [facility];  Service: General;  Laterality: Left;   LEFT HEART CATH AND CORONARY ANGIOGRAPHY N/A 11/04/2017   Procedure: LEFT HEART CATH AND CORONARY ANGIOGRAPHY;  Surgeon: Jordan, Peter M, MD;  Location: [facility] INVASIVE CV LAB;  Service: Cardiovascular;  Laterality: N/A;   TONSILLECTOMY     TUBAL LIGATION      Family History  Problem Relation Age of Onset   Hypertension Mother    CVA Mother    COPD Mother    Heart disease Mother    Kidney  Stones Mother    Heart attack Mother        MI in late 31's   Breast cancer Mother    CAD Father    Hypercholesterolemia Father    Heart attack Father        Died @ 78 of MI   Diabetes Sister    Cancer Maternal Uncle    Diabetes Maternal Grandmother    Cancer Maternal Grandfather        lung cancer   Colon cancer Neg Hx    Esophageal cancer Neg Hx    Stomach cancer Neg Hx    Pancreatic cancer Neg Hx     Social History   Socioeconomic History   Marital status: Widowed    Spouse name: Not on file   Number of  children: Not on file   Years of education: Not on file   Highest education level: Not on file  Occupational History   Not on file  Tobacco Use   Smoking status: Every Day    Current packs/day: 0.00    Average packs/day: 1 pack/day for 41.0 years (41.0 ttl pk-yrs)    Types: Cigarettes    Start date: 10/06/1977    Last attempt to quit: 10/07/2018    Years since quitting: 5.6   Smokeless tobacco: Never  Vaping Use   Vaping status: Never Used  Substance and Sexual Activity   Alcohol use: No   Drug use: No   Sexual activity: Never    Birth control/protection: None  Other Topics Concern   Not on file  Social History Narrative   Lives in [place] with husband.  Unemployed.  Does not routinely exercise.   Social Drivers of Health   Tobacco Use: High Risk (06/04/2024)   Patient History    Smoking Tobacco Use: Every Day    Smokeless Tobacco Use: Never    Passive Exposure: Not on file  Financial Resource Strain: Low Risk (02/07/2024)   Overall Financial Resource Strain (CARDIA)    Difficulty of Paying Living Expenses: Not hard at all  Food Insecurity: No Food Insecurity (02/07/2024)   Epic    Worried About Programme Researcher, Broadcasting/film/video in the Last Year: Never true    Ran Out of Food in the Last Year: Never true  Transportation Needs: No Transportation Needs (02/07/2024)   Epic    Lack of Transportation (Medical): No    Lack of Transportation (Non-Medical): No  Physical Activity: Inactive (02/07/2024)   Exercise Vital Sign    Days of Exercise per Week: 0 days    Minutes of Exercise per Session: 0 min  Stress: No Stress Concern Present (02/07/2024)   [employer] of Occupational Health - Occupational Stress Questionnaire    Feeling of Stress: Not at all  Social Connections: Socially Isolated (02/07/2024)   Social Connection and Isolation Panel    Frequency of Communication with Friends and Family: More than three times a week    Frequency of Social Gatherings with Friends and Family:  Three times a week    Attends Religious Services: Never    Active Member of Clubs or Organizations: No    Attends Banker Meetings: Never    Marital Status: Widowed  Depression (PHQ2-9): Low Risk (03/14/2024)   Depression (PHQ2-9)    PHQ-2 Score: 1  Recent Concern: Depression (PHQ2-9) - Medium Risk (02/07/2024)   Depression (PHQ2-9)    PHQ-2 Score: 5  Alcohol Screen: Low Risk (02/07/2024)   Alcohol Screen    Last  Alcohol Screening Score (AUDIT): 0  Housing: Low Risk (02/07/2024)   Epic    Unable to Pay for Housing in the Last Year: No    Number of Times Moved in the Last Year: 0    Homeless in the Last Year: No  Recent Concern: Housing - High Risk (12/15/2023)   Epic    Unable to Pay for Housing in the Last Year: Not on file    Number of Times Moved in the Last Year: Not on file    Homeless in the Last Year: Yes  Utilities: Not At Risk (02/07/2024)   Epic    Threatened with loss of utilities: No  Health Literacy: Adequate Health Literacy (02/07/2024)   B1300 Health Literacy    Frequency of need for help with medical instructions: Rarely    Allergies[1]  Outpatient Medications Prior to Visit  Medication Sig Dispense Refill   aspirin  EC 81 MG tablet Take 1 tablet (81 mg total) by mouth daily. Swallow whole. 90 tablet 1   buPROPion  (WELLBUTRIN  SR) 150 MG 12 hr tablet Take 1 tablet (150 mg total) by mouth 2 (two) times daily. 180 tablet 1   carvedilol  (COREG ) 3.125 MG tablet Take 1 tablet (3.125 mg total) by mouth 2 (two) times daily with a meal. 180 tablet 1   cephALEXin  (KEFLEX ) 500 MG capsule Take 1 capsule (500 mg total) by mouth 2 (two) times daily. 14 capsule 0   fenofibrate  (TRICOR ) 145 MG tablet Take 1 tablet (145 mg total) by mouth every evening. 90 tablet 1   FISH OIL-CHOLECALCIFEROL PO Take by mouth.     FLUoxetine  (PROZAC ) 20 MG capsule Take 1 capsule (20 mg total) by mouth every morning. 90 capsule 1   furosemide  (LASIX ) 20 MG tablet Take 1 tablet (20 mg  total) by mouth daily. 90 tablet 1   gabapentin  (NEURONTIN ) 300 MG capsule Take 2 capsules (600 mg total) by mouth 3 (three) times daily. 180 capsule 3   hydrOXYzine  (ATARAX ) 25 MG tablet Take 1 tablet (25 mg total) by mouth 3 (three) times daily as needed. For anxiety 60 tablet 1   Insulin  Pen Needle (COMFORT EZ PEN NEEDLES) 31G X 5 MM MISC Inject 1 each into the skin in the morning and at bedtime. use as directed 100 each 3   Lancets Misc. (ACCU-CHEK FASTCLIX LANCET) KIT USE AS DIRECTED 1 kit 11   lisinopril  (ZESTRIL ) 40 MG tablet Take 1 tablet (40 mg total) by mouth daily. 90 tablet 1   metFORMIN  (GLUCOPHAGE ) 500 MG tablet Take 2 tablets (1,000 mg total) by mouth 2 (two) times daily. 360 tablet 1   methocarbamol  (ROBAXIN ) 750 MG tablet Take 1 tablet (750 mg total) by mouth every 8 (eight) hours as needed for muscle spasms. 90 tablet 2   metoCLOPramide  (REGLAN ) 10 MG tablet Take 1 tablet (10 mg total) by mouth every 8 (eight) hours as needed for nausea and vomiting 40 tablet 0   nitroGLYCERIN  (NITROSTAT ) 0.4 MG SL tablet Place 0.4 mg under the tongue every 5 (five) minutes as needed for chest pain.     nystatin  cream (MYCOSTATIN ) Apply to affected area 2 times daily 30 g 3   oxyCODONE  (OXY IR/ROXICODONE ) 5 MG immediate release tablet Take 5 mg by mouth every 4 (four) hours as needed for moderate pain.     pantoprazole  (PROTONIX ) 40 MG tablet Take 1 tablet (40 mg total) by mouth 2 (two) times daily. 180 tablet 1   phenazopyridine  (PYRIDIUM ) 200 MG  tablet Take 1 tablet (200 mg total) by mouth 3 (three) times daily. 6 tablet 0   rosuvastatin  (CRESTOR ) 40 MG tablet Take 1 tablet (40 mg total) by mouth every evening. 90 tablet 1   sucralfate  (CARAFATE ) 1 g tablet Take 1 g by mouth 4 (four) times daily -  with meals and at bedtime.     traZODone  (DESYREL ) 50 MG tablet Take 1 tablet (50 mg total) by mouth at bedtime. 90 tablet 1   Accu-Chek Softclix Lancets lancets Use to check blood sugar 3 times daily.  E11.65 100 each 6   Blood Glucose Monitoring Suppl (ACCU-CHEK GUIDE) w/Device KIT Use to check blood sugar 3 times daily. E11.65 1 kit 0   fluconazole  (DIFLUCAN ) 150 MG tablet Take 1 tablet (150 mg total) by mouth  once daily.as directed. If your symptoms do not improve by 04-16-24 please take the second dose of Diflucan  by mouth.  If your symptoms have still not resolved by 04-19-24 please pick up the third dose of Diflucan . 1 tablet 1   glucose blood (ACCU-CHEK GUIDE TEST) test strip Use to check blood sugar 3 times daily. E11.65 100 each 6   insulin  glargine (LANTUS  SOLOSTAR) 100 UNIT/ML Solostar Pen Inject 33 Units into the skin 2 (two) times daily. 45 mL 6   nystatin  powder Apply 1 Application topically 3 (three) times daily. 60 g 1   No facility-administered medications prior to visit.     ROS Review of Systems  Constitutional:  Positive for fatigue. Negative for activity change and appetite change.  HENT:  Negative for sinus pressure and sore throat.   Respiratory:  Negative for chest tightness, shortness of breath and wheezing.   Cardiovascular:  Negative for chest pain and palpitations.  Gastrointestinal:  Negative for abdominal distention, abdominal pain and constipation.  Genitourinary:  Positive for frequency.  Musculoskeletal: Negative.   Psychiatric/Behavioral:  Negative for behavioral problems and dysphoric mood.     Objective:  BP 137/76   Pulse 75   Temp 97.8 F (36.6 C) (Oral)   Ht 5' 3 (1.6 m)   Wt 164 lb 3.2 oz (74.5 kg)   SpO2 92%   BMI 29.09 kg/m      06/04/2024   10:54 AM 06/04/2024   10:12 AM 05/22/2024    7:39 AM  BP/Weight  Systolic BP 137 171 180  Diastolic BP 76 74 103  Wt. (Lbs)  164.2   BMI  29.09 kg/m2       Physical Exam Constitutional:      Appearance: She is well-developed.  Cardiovascular:     Rate and Rhythm: Normal rate.     Heart sounds: Normal heart sounds. No murmur heard. Pulmonary:     Effort: Pulmonary effort is normal.      Breath sounds: Normal breath sounds. No wheezing or rales.  Chest:     Chest wall: No tenderness.  Abdominal:     General: Bowel sounds are normal. There is no distension.     Palpations: Abdomen is soft. There is no mass.     Tenderness: There is no abdominal tenderness.  Musculoskeletal:        General: Normal range of motion.     Right lower leg: No edema.     Left lower leg: No edema.  Skin:    Comments: Erythema of infra abdominal fold, groin and bilateral crural folds  Neurological:     Mental Status: She is alert and oriented to person, place, and time.  Psychiatric:        Mood and Affect: Mood normal.    Diabetic Foot Exam - Simple   Simple Foot Form Diabetic Foot exam was performed with the following findings: Yes 06/04/2024 10:33 AM  Visual Inspection No deformities, no ulcerations, no other skin breakdown bilaterally: Yes Sensation Testing Intact to touch and monofilament testing bilaterally: Yes Pulse Check Posterior Tibialis and Dorsalis pulse intact bilaterally: Yes Comments        Latest Ref Rng & Units 05/22/2024    7:53 AM 05/05/2024    4:06 PM 05/05/2024    1:41 PM  CMP  Glucose 70 - 99 mg/dL 461   409   BUN 8 - 23 mg/dL 14   10   Creatinine 9.55 - 1.00 mg/dL 9.21   9.30   Sodium 864 - 145 mmol/L 133  133  132   Potassium 3.5 - 5.1 mmol/L 4.4  4.3  3.9   Chloride 98 - 111 mmol/L 94   94   CO2 22 - 32 mmol/L 25   24   Calcium  8.9 - 10.3 mg/dL 89.8   9.2   Total Protein 6.5 - 8.1 g/dL 7.0   6.7   Total Bilirubin 0.0 - 1.2 mg/dL 0.4   0.5   Alkaline Phos 38 - 126 U/L 167   109   AST 15 - 41 U/L 15   11   ALT 0 - 44 U/L 9   11     Lipid Panel     Component Value Date/Time   CHOL 137 11/12/2021 1018   TRIG 102 11/12/2021 1018   HDL 54 11/12/2021 1018   CHOLHDL 3.9 08/14/2020 1124   CHOLHDL 9.7 (H) 07/23/2016 1638   VLDL NOT CALC 07/23/2016 1638   LDLCALC 64 11/12/2021 1018    CBC    Component Value Date/Time   WBC 8.5 05/22/2024  0753   RBC 4.89 05/22/2024 0753   HGB 15.2 (H) 05/22/2024 0753   HGB 14.2 11/09/2018 1158   HCT 42.9 05/22/2024 0753   HCT 42.8 11/09/2018 1158   PLT 281 05/22/2024 0753   PLT 321 11/09/2018 1158   MCV 87.7 05/22/2024 0753   MCV 93 11/09/2018 1158   MCH 31.1 05/22/2024 0753   MCHC 35.4 05/22/2024 0753   RDW 12.8 05/22/2024 0753   RDW 12.5 11/09/2018 1158   LYMPHSABS 2.4 05/22/2024 0753   LYMPHSABS 2.0 11/09/2018 1158   MONOABS 0.7 05/22/2024 0753   EOSABS 0.2 05/22/2024 0753   EOSABS 0.1 11/09/2018 1158   BASOSABS 0.1 05/22/2024 0753   BASOSABS 0.1 11/09/2018 1158    Lab Results  Component Value Date   HGBA1C 14.2 (A) 06/04/2024    Lab Results  Component Value Date   TSH 1.977 08/28/2021       Assessment & Plan Type 2 diabetes mellitus with hyperglycemia and complications Severe hyperglycemia with A1c of 14.2 . Nonadherence to medication regimen and delayed insulin  administration noted. No home glucose monitoring device available. - Increased insulin  glargine to 40 units twice daily. - Provided a blood glucose meter for home monitoring. - Scheduled follow-up in one month to assess blood sugar control and fatigue.  Vulvovaginal and intertriginous candidiasis Recurrent due to hyperglycemia, exacerbated by urinary incontinence and frequent diaper use. - Prescribed oral antifungal medication for one week - Prescribed topical antifungal powder for application. -UA negative for UTI but presence of hematuria which could be due to trauma from scratching of vulva - Advised frequent diaper  changes to prevent skin damage and fungal spread.  Hypertension associated with type 2 diabetes - Initially uncontrolled but blood pressure normalized upon repeat -Continue current antihypertensive -Counseled on blood pressure goal of less than 130/80, low-sodium, DASH diet, medication compliance, 150 minutes of moderate intensity exercise per week. Discussed medication compliance,  adverse effects.   Hyperlipidemia associated with type 2 diabetes mellitus -Controlled - She is due for repeat lipid panel which has been ordered - Continue statin - Low-cholesterol diet   CAD S/p DES Asymptomatic Secondary risk factor medication including glycemic control, blood pressure control and maintaining LDL less than 70 - Continue statin  Meds ordered this encounter  Medications   fluconazole  (DIFLUCAN ) 150 MG tablet    Sig: Take 1 tablet (150 mg total) by mouth daily.    Dispense:  7 tablet    Refill:  0   insulin  glargine (LANTUS  SOLOSTAR) 100 UNIT/ML Solostar Pen    Sig: Inject 40 Units into the skin 2 (two) times daily. (Dose increase)    Dispense:  45 mL    Refill:  6    Patient prefers delivery   nystatin  powder    Sig: Apply 1 Application topically 3 (three) times daily.    Dispense:  60 g    Refill:  1   Accu-Chek Softclix Lancets lancets    Sig: Use to check blood sugar 3 times daily. E11.65    Dispense:  100 each    Refill:  6    Patient requests delivery.   Blood Glucose Monitoring Suppl (ACCU-CHEK GUIDE ME) w/Device KIT    Sig: Use to check blood sugar 3 times daily.    Dispense:  1 kit    Refill:  0    Patient requests delivery.   glucose blood (ACCU-CHEK GUIDE TEST) test strip    Sig: Use to check blood sugar 3 times daily. E11.65    Dispense:  100 each    Refill:  6    Patient requests delivery.    Follow-up: Return in about 1 month (around 07/05/2024) for Diabetes follow-up with PCP.       Corrina Sabin, MD, FAAFP. [facility] [place], [place] [phone]   06/04/2024, 11:47 AM     [1]  Allergies Allergen Reactions   Cymbalta [Duloxetine Hcl] Nausea Only and Other (See Comments)    Nausea, lack of therapeutic effect   "

## 2024-06-04 NOTE — Patient Instructions (Signed)
 VISIT SUMMARY:  During your visit, we discussed your ongoing issues with diabetes, recurrent yeast infections, and urinary symptoms. We reviewed your recent emergency room visits and your current symptoms, including external genital burning, frequent urination, and discomfort when sitting. We also discussed your diabetes management and the need for better blood sugar control.  YOUR PLAN:  -TYPE 2 DIABETES MELLITUS WITH HYPERGLYCEMIA AND COMPLICATIONS: Your blood sugar levels are very high, which is causing complications. We have increased your insulin  dose to 40 units twice daily and provided you with a blood glucose meter to monitor your levels at home. It is important to take your medications on time and follow up in one month to assess your blood sugar control and fatigue.  -VULVOVAGINAL AND CUTANEOUS CANDIDIASIS: You have a recurrent yeast infection, likely due to high blood sugar levels and frequent diaper use. We have prescribed an oral antifungal medication for three days and a topical antifungal cream. Please change your diapers frequently to prevent skin damage and the spread of the infection.  -URINARY INCONTINENCE: You are experiencing urinary incontinence, which is contributing to skin damage and fungal infections. It is important to change your diapers frequently to prevent these issues.  INSTRUCTIONS:  Please follow up in one month to assess your blood sugar control and fatigue. Make sure to take your medications on time and use the blood glucose meter to monitor your levels at home.

## 2024-06-05 ENCOUNTER — Other Ambulatory Visit (HOSPITAL_COMMUNITY): Payer: Self-pay

## 2024-06-05 ENCOUNTER — Other Ambulatory Visit: Payer: Self-pay

## 2024-06-05 LAB — MICROALBUMIN / CREATININE URINE RATIO
Creatinine, Urine: 30 mg/dL
Microalb/Creat Ratio: 39 mg/g{creat} — ABNORMAL HIGH (ref 0–29)
Microalbumin, Urine: 11.7 ug/mL

## 2024-06-06 ENCOUNTER — Other Ambulatory Visit: Payer: Self-pay

## 2024-06-06 ENCOUNTER — Other Ambulatory Visit (HOSPITAL_COMMUNITY): Payer: Self-pay

## 2024-06-06 ENCOUNTER — Other Ambulatory Visit (HOSPITAL_BASED_OUTPATIENT_CLINIC_OR_DEPARTMENT_OTHER): Payer: Self-pay

## 2024-06-07 ENCOUNTER — Other Ambulatory Visit (HOSPITAL_COMMUNITY): Payer: Self-pay

## 2024-06-07 ENCOUNTER — Ambulatory Visit: Payer: Self-pay | Admitting: Family Medicine

## 2024-06-07 MED ORDER — DAPAGLIFLOZIN PROPANEDIOL 10 MG PO TABS
10.0000 mg | ORAL_TABLET | Freq: Every day | ORAL | 1 refills | Status: AC
  Filled 2024-06-07 – 2024-06-08 (×2): qty 90, 90d supply, fill #0

## 2024-06-07 NOTE — Progress Notes (Unsigned)
"   °  °  Cardiology Office Note Date:  06/07/2024  ID:  Demmi Sindt, DOB [birth year], MRN [id number] PCP:  Delbert Clam, MD  Cardiologist:  Joelle VEAR Ren Donley, MD  No chief complaint on file.    Problems CAD S/p LAD PCI 6/19 LHC 6/19: Lcx 30, prox RCA 20 TTE 6/23: 70-75%, mod LAE IDDM OSA Tobacco use- 41 pack year LE edema M: ASA81, CL3.125, DN10, FE145, FE20, Insulin , LL40, RN40  Visits 1/26: LP, lung CA screening, d/c FE    Discussed the use of AI scribe software for clinical note transcription with the patient, who gave verbal consent to proceed.  History of Present Illness    ROS: Please see the history of present illness. All other systems are reviewed and negative.    PHYSICAL EXAM: VS:  There were no vitals taken for this visit. , BMI There is no height or weight on file to calculate BMI. GEN: Well nourished, well developed, in no acute distress HEENT: normal Neck: no JVD, carotid bruits, or masses Cardiac: ***RRR; no murmurs, rubs, or gallops,no edema  Respiratory:  CTAB bilaterally, normal work of breathing GI: soft, nontender, nondistended, + BS Extremities: No LE edema Skin: warm and dry, no rash Neuro:  Strength and sensation are intact  EKG: ***  Recent Labs: Reviewed  Studies: Reviewed  Assessment & Plan       Signed, Joelle VEAR Ren Donley, MD  06/07/2024 3:19 PM    [facility] "

## 2024-06-08 ENCOUNTER — Ambulatory Visit

## 2024-06-08 ENCOUNTER — Other Ambulatory Visit: Payer: Self-pay

## 2024-06-08 ENCOUNTER — Other Ambulatory Visit (HOSPITAL_COMMUNITY): Payer: Self-pay

## 2024-06-08 DIAGNOSIS — I2510 Atherosclerotic heart disease of native coronary artery without angina pectoris: Secondary | ICD-10-CM

## 2024-06-08 DIAGNOSIS — E119 Type 2 diabetes mellitus without complications: Secondary | ICD-10-CM

## 2024-06-08 DIAGNOSIS — F17200 Nicotine dependence, unspecified, uncomplicated: Secondary | ICD-10-CM

## 2024-06-08 DIAGNOSIS — I10 Essential (primary) hypertension: Secondary | ICD-10-CM

## 2024-06-12 ENCOUNTER — Other Ambulatory Visit (HOSPITAL_COMMUNITY): Payer: Self-pay

## 2024-06-15 ENCOUNTER — Telehealth: Payer: Self-pay | Admitting: Family Medicine

## 2024-06-15 NOTE — Telephone Encounter (Signed)
Does patient qualify? ?

## 2024-06-15 NOTE — Telephone Encounter (Signed)
 I will need to address that at her upcoming appointment in order to have office notes to justify the order.

## 2024-06-15 NOTE — Telephone Encounter (Signed)
 Copied from CRM [id number]. Topic: Clinical - Order For Equipment >> Jun 15, 2024 12:43 PM   Sophia H wrote:  Reason for CRM: Patient is requesting provider send in orders for a back brace. States she has arthritis in her back and has pain when she walks. Please advise # [phone]

## 2024-06-18 NOTE — Telephone Encounter (Signed)
 Noted.  Pt has appointment on 07/05/24

## 2024-06-20 ENCOUNTER — Other Ambulatory Visit (HOSPITAL_COMMUNITY): Payer: Self-pay

## 2024-07-05 ENCOUNTER — Ambulatory Visit: Payer: Self-pay | Admitting: Family Medicine

## 2024-07-30 ENCOUNTER — Ambulatory Visit
